# Patient Record
Sex: Male | Born: 1945 | Race: Black or African American | Hispanic: No | Marital: Married | Smoking: Former smoker
Health system: Southern US, Community
[De-identification: ages and names within clinical notes are randomized; demographics above are authoritative.]

## PROBLEM LIST (undated history)

## (undated) DIAGNOSIS — I509 Heart failure, unspecified: Secondary | ICD-10-CM

## (undated) DIAGNOSIS — Z809 Family history of malignant neoplasm, unspecified: Secondary | ICD-10-CM

## (undated) DIAGNOSIS — K219 Gastro-esophageal reflux disease without esophagitis: Secondary | ICD-10-CM

## (undated) DIAGNOSIS — H269 Unspecified cataract: Secondary | ICD-10-CM

## (undated) DIAGNOSIS — R51 Headache: Secondary | ICD-10-CM

## (undated) DIAGNOSIS — Z951 Presence of aortocoronary bypass graft: Secondary | ICD-10-CM

## (undated) DIAGNOSIS — N529 Male erectile dysfunction, unspecified: Secondary | ICD-10-CM

## (undated) DIAGNOSIS — I739 Peripheral vascular disease, unspecified: Secondary | ICD-10-CM

## (undated) DIAGNOSIS — C801 Malignant (primary) neoplasm, unspecified: Secondary | ICD-10-CM

## (undated) DIAGNOSIS — R0600 Dyspnea, unspecified: Secondary | ICD-10-CM

## (undated) DIAGNOSIS — R519 Headache, unspecified: Secondary | ICD-10-CM

## (undated) DIAGNOSIS — Z808 Family history of malignant neoplasm of other organs or systems: Secondary | ICD-10-CM

## (undated) DIAGNOSIS — E559 Vitamin D deficiency, unspecified: Secondary | ICD-10-CM

## (undated) DIAGNOSIS — E119 Type 2 diabetes mellitus without complications: Secondary | ICD-10-CM

## (undated) DIAGNOSIS — E785 Hyperlipidemia, unspecified: Secondary | ICD-10-CM

## (undated) DIAGNOSIS — M4802 Spinal stenosis, cervical region: Secondary | ICD-10-CM

## (undated) DIAGNOSIS — I2510 Atherosclerotic heart disease of native coronary artery without angina pectoris: Secondary | ICD-10-CM

## (undated) DIAGNOSIS — J309 Allergic rhinitis, unspecified: Secondary | ICD-10-CM

## (undated) DIAGNOSIS — K811 Chronic cholecystitis: Secondary | ICD-10-CM

## (undated) DIAGNOSIS — M5030 Other cervical disc degeneration, unspecified cervical region: Secondary | ICD-10-CM

## (undated) DIAGNOSIS — I10 Essential (primary) hypertension: Secondary | ICD-10-CM

## (undated) DIAGNOSIS — E860 Dehydration: Secondary | ICD-10-CM

## (undated) DIAGNOSIS — M109 Gout, unspecified: Secondary | ICD-10-CM

## (undated) DIAGNOSIS — R002 Palpitations: Secondary | ICD-10-CM

## (undated) HISTORY — DX: Hyperlipidemia, unspecified: E78.5

## (undated) HISTORY — PX: COLONOSCOPY: SHX174

## (undated) HISTORY — DX: Vitamin D deficiency, unspecified: E55.9

## (undated) HISTORY — DX: Male erectile dysfunction, unspecified: N52.9

## (undated) HISTORY — DX: Hypercalcemia: E83.52

## (undated) HISTORY — DX: Family history of malignant neoplasm, unspecified: Z80.9

## (undated) HISTORY — DX: Other cervical disc degeneration, unspecified cervical region: M50.30

## (undated) HISTORY — DX: Essential (primary) hypertension: I10

## (undated) HISTORY — DX: Spinal stenosis, cervical region: M48.02

## (undated) HISTORY — DX: Allergic rhinitis, unspecified: J30.9

## (undated) HISTORY — DX: Gout, unspecified: M10.9

## (undated) HISTORY — DX: Headache: R51

## (undated) HISTORY — DX: Gastro-esophageal reflux disease without esophagitis: K21.9

## (undated) HISTORY — DX: Palpitations: R00.2

## (undated) HISTORY — DX: Chronic cholecystitis: K81.1

## (undated) HISTORY — DX: Family history of malignant neoplasm of other organs or systems: Z80.8

## (undated) HISTORY — DX: Headache, unspecified: R51.9

---
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
## 2008-10-11 HISTORY — PX: VASCULAR SURGERY: SHX849

## 2008-11-25 ENCOUNTER — Ambulatory Visit: Payer: Self-pay | Admitting: Unknown Physician Specialty

## 2009-02-04 ENCOUNTER — Ambulatory Visit: Payer: Self-pay | Admitting: Vascular Surgery

## 2010-01-09 HISTORY — PX: OTHER SURGICAL HISTORY: SHX169

## 2010-01-21 ENCOUNTER — Ambulatory Visit: Payer: Self-pay | Admitting: Vascular Surgery

## 2010-07-06 ENCOUNTER — Ambulatory Visit: Payer: Self-pay | Admitting: Gastroenterology

## 2010-07-06 LAB — HM COLONOSCOPY

## 2010-07-13 LAB — PATHOLOGY REPORT

## 2010-12-14 ENCOUNTER — Ambulatory Visit: Payer: Self-pay

## 2011-05-03 ENCOUNTER — Ambulatory Visit: Payer: Self-pay | Admitting: Family Medicine

## 2012-02-14 ENCOUNTER — Ambulatory Visit: Payer: Self-pay | Admitting: Family Medicine

## 2012-08-01 ENCOUNTER — Ambulatory Visit: Payer: Self-pay | Admitting: Family Medicine

## 2014-01-03 ENCOUNTER — Ambulatory Visit: Payer: Self-pay | Admitting: Family Medicine

## 2014-11-15 DIAGNOSIS — R51 Headache: Secondary | ICD-10-CM | POA: Diagnosis not present

## 2015-01-09 DIAGNOSIS — H40003 Preglaucoma, unspecified, bilateral: Secondary | ICD-10-CM | POA: Diagnosis not present

## 2015-02-28 DIAGNOSIS — I10 Essential (primary) hypertension: Secondary | ICD-10-CM | POA: Diagnosis not present

## 2015-02-28 DIAGNOSIS — E785 Hyperlipidemia, unspecified: Secondary | ICD-10-CM | POA: Diagnosis not present

## 2015-02-28 DIAGNOSIS — Z0000 Encounter for general adult medical examination without abnormal findings: Secondary | ICD-10-CM | POA: Diagnosis not present

## 2015-05-02 ENCOUNTER — Ambulatory Visit (INDEPENDENT_AMBULATORY_CARE_PROVIDER_SITE_OTHER): Payer: Medicare Other | Admitting: Family Medicine

## 2015-05-02 ENCOUNTER — Other Ambulatory Visit: Payer: Self-pay

## 2015-05-02 ENCOUNTER — Encounter: Payer: Self-pay | Admitting: Family Medicine

## 2015-05-02 VITALS — BP 140/76 | HR 82 | Temp 97.4°F | Resp 16 | Wt 179.8 lb

## 2015-05-02 DIAGNOSIS — S60949A Unspecified superficial injury of unspecified finger, initial encounter: Secondary | ICD-10-CM

## 2015-05-02 DIAGNOSIS — L089 Local infection of the skin and subcutaneous tissue, unspecified: Secondary | ICD-10-CM | POA: Diagnosis not present

## 2015-05-02 MED ORDER — CEPHALEXIN 500 MG PO CAPS: 500.0000 mg | ORAL_CAPSULE | Freq: Two times a day (BID) | ORAL | Status: DC

## 2015-05-02 NOTE — Patient Instructions (Signed)
Soak right finger in epsom salts/warm water for 5-10 minutes twice daily

## 2015-05-02 NOTE — Progress Notes (Signed)
Subjective:     Patient ID: Craig [name], male   DOB: 1946-05-14, 69 y.o.   MRN: [id number]  HPI  Chief Complaint  Patient presents with  . Foreign Body in Skin    patient reports that on Wednesday after working he had noticed that his middle finger on right hand appeared to be swollen. Patient states that he believes that he has a piece of metal or wood stuck under his skin. Last Tdap was 11/03/2012  States he was using a hammer to work on a machine when the wood handle broke and something pierced his right third finger. He has cleansed with alcohol and applied a bandaid. States he has seen both pus and blood drain from the wound.   Review of Systems  Constitutional: Negative for fever and chills.       Objective:   Physical Exam  Constitutional: He appears well-developed and well-nourished. No distress.  Skin:  Right distal third finger with superficial laceration with tenderness,mild swelling and erythema. No drainage or f.b. Visualized or palpated.       Assessment:    1. Injury, superficial, finger with infection, initial encounter - cephALEXin (KEFLEX) 500 MG capsule; Take 1 capsule (500 mg total) by mouth 2 (two) times daily.  Dispense: 14 capsule; Refill: 0    Plan:    Discussed salt water soaks. Return 7/25 if not improving.

## 2015-06-25 ENCOUNTER — Telehealth: Payer: Self-pay

## 2015-06-25 ENCOUNTER — Encounter: Payer: Self-pay | Admitting: Family Medicine

## 2015-06-25 ENCOUNTER — Ambulatory Visit (INDEPENDENT_AMBULATORY_CARE_PROVIDER_SITE_OTHER): Payer: Medicare Other | Admitting: Family Medicine

## 2015-06-25 ENCOUNTER — Ambulatory Visit
Admission: RE | Admit: 2015-06-25 | Discharge: 2015-06-25 | Disposition: A | Discharge status: Home / Self Care | Payer: Medicare Other | Source: Ambulatory Visit | Attending: Family Medicine | Admitting: Family Medicine

## 2015-06-25 VITALS — BP 124/68 | HR 78 | Temp 98.5°F | Resp 16 | Wt 180.4 lb

## 2015-06-25 DIAGNOSIS — R202 Paresthesia of skin: Secondary | ICD-10-CM

## 2015-06-25 DIAGNOSIS — I70219 Atherosclerosis of native arteries of extremities with intermittent claudication, unspecified extremity: Secondary | ICD-10-CM | POA: Insufficient documentation

## 2015-06-25 DIAGNOSIS — K219 Gastro-esophageal reflux disease without esophagitis: Secondary | ICD-10-CM | POA: Insufficient documentation

## 2015-06-25 DIAGNOSIS — M4802 Spinal stenosis, cervical region: Secondary | ICD-10-CM

## 2015-06-25 DIAGNOSIS — I739 Peripheral vascular disease, unspecified: Secondary | ICD-10-CM

## 2015-06-25 DIAGNOSIS — H60543 Acute eczematoid otitis externa, bilateral: Secondary | ICD-10-CM

## 2015-06-25 DIAGNOSIS — I10 Essential (primary) hypertension: Secondary | ICD-10-CM | POA: Insufficient documentation

## 2015-06-25 LAB — POCT GLYCOSYLATED HEMOGLOBIN (HGB A1C): Hemoglobin A1C: 5.5

## 2015-06-25 MED ORDER — TRIAMCINOLONE ACETONIDE 0.1 % EX CREA: 1.0000 "application " | TOPICAL_CREAM | Freq: Two times a day (BID) | CUTANEOUS | Status: DC

## 2015-06-25 MED ORDER — GABAPENTIN 300 MG PO CAPS: 300.0000 mg | ORAL_CAPSULE | Freq: Three times a day (TID) | ORAL | Status: DC

## 2015-06-25 NOTE — Patient Instructions (Addendum)
If you need help with your neurosurgery appointment let us know. We will call you with your back x-ray results.

## 2015-06-25 NOTE — Telephone Encounter (Signed)
Left message for patient to call back. KW 

## 2015-06-25 NOTE — Progress Notes (Signed)
Subjective:     Patient ID: Craig [name], male   DOB: 01-29-1946, 69 y.o.   MRN: [id number]  HPI  Chief Complaint  Patient presents with  . Neck Pain    Patient comes in office today with concerns of pain in his neck radiating down his back for the past week.   . Leg Pain    Patient reports sharp pain the back of his legs intermittent for 1 week.   States he has persistent numbness and tingling in his feet. After riding on a small tractor last week he developed sharp pains in his posterior thighs radiating down his leg when he moved from sitting to standing. Reports he was jostled a lot on the tractor. Pain has improved. States his neck pain from cervical spinal stenosis is worsening and he is considering once again to go back to [facility] though prefers not to have surgery. 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   Review of Systems  Constitutional: Negative for fever and chills.  Endocrine:       Mild elevated glucose in May @ 108.       Objective:   Physical Exam  Constitutional: He appears well-developed and well-nourished. No distress.  HENT:  Bilateral external ears with mild scaling  Cardiovascular: Intact distal pulses.   Musculoskeletal: He exhibits no edema (lower extremities).  Muscle strength in lower extremities 5/5. SLR's to 90 degrees without back pain or radiation of pain.  Neurological:  Sensation to PP intact in distal lower extremities       Assessment:    1. Eczema of external ear, bilateral - triamcinolone cream (KENALOG) 0.1 %; Apply 1 application topically 2 (two) times daily. To external ear  Dispense: 30 g; Refill: 2  2. Cervical stenosis of spinal canal - gabapentin (NEURONTIN) 300 MG capsule; Take 1 capsule (300 mg total) by mouth 3 (three) times daily.  Dispense: 90 capsule; Refill: 5  3. Paresthesias: Prior LS spine x-ray in 2012 WNL - DG Lumbar Spine Complete; Future - POCT HgB A1C    Plan:    Further f/u  pending x-ray. Provided number for [facility] to self refer as has previously seen Dr. Hal Neer.

## 2015-06-25 NOTE — Telephone Encounter (Signed)
-----   Message from Carmon Ginsberg, [place] sent at 06/25/2015 10:38 AM EDT ----- Mild arthritic changes. Let's hold off on MRI for now and see if leg pain has gotten better.

## 2015-06-26 ENCOUNTER — Telehealth: Payer: Self-pay | Admitting: Family Medicine

## 2015-06-26 ENCOUNTER — Other Ambulatory Visit: Payer: Self-pay | Admitting: Family Medicine

## 2015-06-26 DIAGNOSIS — M79604 Pain in right leg: Secondary | ICD-10-CM

## 2015-06-26 DIAGNOSIS — R202 Paresthesia of skin: Secondary | ICD-10-CM

## 2015-06-26 DIAGNOSIS — M79605 Pain in left leg: Principal | ICD-10-CM

## 2015-06-26 NOTE — Telephone Encounter (Signed)
Request for MRI sent

## 2015-06-26 NOTE — Telephone Encounter (Signed)
Spoke with patient on the phone who reports pain in leg is not getting better and feels that it is worse and wants to proceed with MRI

## 2015-07-01 ENCOUNTER — Ambulatory Visit
Admission: RE | Admit: 2015-07-01 | Discharge: 2015-07-01 | Disposition: A | Discharge status: Home / Self Care | Payer: Medicare Other | Source: Ambulatory Visit | Attending: Family Medicine | Admitting: Family Medicine

## 2015-07-01 DIAGNOSIS — M79604 Pain in right leg: Secondary | ICD-10-CM | POA: Insufficient documentation

## 2015-07-01 DIAGNOSIS — R202 Paresthesia of skin: Secondary | ICD-10-CM | POA: Insufficient documentation

## 2015-07-01 DIAGNOSIS — M79605 Pain in left leg: Secondary | ICD-10-CM | POA: Insufficient documentation

## 2015-07-01 DIAGNOSIS — M4806 Spinal stenosis, lumbar region: Secondary | ICD-10-CM | POA: Insufficient documentation

## 2015-07-02 ENCOUNTER — Telehealth: Payer: Self-pay

## 2015-07-02 NOTE — Telephone Encounter (Signed)
[id number] 

## 2015-07-02 NOTE — Telephone Encounter (Signed)
-----   Message from Carmon Ginsberg, [place] sent at 07/02/2015  7:46 AM EDT ----- Disc bulges and arthritic changes are pushing into your spinal cord in your lower back. Recommend you get an opinion from the neurosurgeon about what to do next. Do you wish [place] to refer you?

## 2015-07-07 NOTE — Telephone Encounter (Signed)
[id number] 

## 2015-07-08 ENCOUNTER — Other Ambulatory Visit: Payer: Self-pay | Admitting: Family Medicine

## 2015-07-08 DIAGNOSIS — M79604 Pain in right leg: Secondary | ICD-10-CM

## 2015-07-08 DIAGNOSIS — M79605 Pain in left leg: Secondary | ICD-10-CM

## 2015-07-08 NOTE — Telephone Encounter (Signed)
Spoke with patient on the phone and advised as below in reference to report. He states that he would like a referral to see a neurosurgeon for further evaluation, he is requesting that it be in the [place] area

## 2015-07-08 NOTE — Telephone Encounter (Signed)
Referral in progress. 

## 2015-08-11 ENCOUNTER — Ambulatory Visit (INDEPENDENT_AMBULATORY_CARE_PROVIDER_SITE_OTHER): Payer: Medicare Other | Admitting: Family Medicine

## 2015-08-11 ENCOUNTER — Encounter: Payer: Self-pay | Admitting: Family Medicine

## 2015-08-11 VITALS — BP 150/80 | HR 77 | Temp 98.0°F | Resp 16 | Wt 180.6 lb

## 2015-08-11 DIAGNOSIS — J029 Acute pharyngitis, unspecified: Secondary | ICD-10-CM

## 2015-08-11 LAB — POCT RAPID STREP A (OFFICE): Rapid Strep A Screen: NEGATIVE

## 2015-08-11 NOTE — Patient Instructions (Addendum)
Discussed use of Delsym for cough, saline nasal spray for congestion, and Claritin for nasal drip during the day, Benadryl for drainage at night.

## 2015-08-11 NOTE — Progress Notes (Signed)
Subjective:     Patient ID: Craig [name], male   DOB: 04-29-1946, 69 y.o.   MRN: [id number]  HPI  Chief Complaint  Patient presents with  . Sore Throat  States he developed sore throat, sinus congestion, and sweats on 10/28. Reports throat remains dry with minimal cough. Completing a course of prednisone for a lumbar radiculopathy.   Review of Systems     Objective:   Physical Exam  Constitutional: He appears well-developed and well-nourished. No distress.  Ears: T.M's intact without inflammation Throat: mild erythema without tonsillar enlargement or exudate. Neck: right anterior cervical tenderness Lungs: clear    Assessment:    1. Pharyngitis: suspect early URI - POCT rapid strep A    Plan:    Discussed sx treatment of probable URI

## 2015-08-13 DIAGNOSIS — Z6826 Body mass index (BMI) 26.0-26.9, adult: Secondary | ICD-10-CM | POA: Diagnosis not present

## 2015-08-13 DIAGNOSIS — I10 Essential (primary) hypertension: Secondary | ICD-10-CM | POA: Diagnosis not present

## 2015-08-13 DIAGNOSIS — M4316 Spondylolisthesis, lumbar region: Secondary | ICD-10-CM | POA: Diagnosis not present

## 2015-08-14 ENCOUNTER — Encounter: Payer: Self-pay | Admitting: Family Medicine

## 2015-08-14 ENCOUNTER — Ambulatory Visit (INDEPENDENT_AMBULATORY_CARE_PROVIDER_SITE_OTHER): Payer: Medicare Other | Admitting: Family Medicine

## 2015-08-14 VITALS — BP 126/82 | HR 83 | Temp 98.1°F | Resp 16 | Wt 180.8 lb

## 2015-08-14 DIAGNOSIS — I10 Essential (primary) hypertension: Secondary | ICD-10-CM | POA: Diagnosis not present

## 2015-08-14 DIAGNOSIS — R52 Pain, unspecified: Secondary | ICD-10-CM | POA: Diagnosis not present

## 2015-08-14 NOTE — Patient Instructions (Signed)
We will call you with the lab results. 

## 2015-08-14 NOTE — Progress Notes (Signed)
Subjective:     Patient ID: Craig [name], male   DOB: May 29, 1946, 69 y.o.   MRN: [id number]  HPI  Chief Complaint  Patient presents with  . Generalized Body Aches    Patient comes in today with concerns of body ache that began yesterday around 9AM and subsided around midnight patient states that this has been intermittent for x 1 week.   . Hypertension    Patient has concerns of elevated blood pressure that has been fluctuating for the past several days. Earlier today patient came in office for blood pressure check and reading was 164/72.   States sinus congestion and sore throat have resolved from 10/31 visit. Reports he had severe body aches for much of the day yesterday but these have abated. Reports he saw his neurosurgeon yesterday and was placed back on Naproxen for his cervical and lumbar spinal stenosis.   Review of Systems     Objective:   Physical Exam  Constitutional: He appears well-developed and well-nourished. No distress.  Cardiovascular: Normal rate and regular rhythm.   Pulmonary/Chest: Breath sounds normal.  Abdominal: Soft. There is tenderness (mild epigastric). There is no guarding.       Assessment:    1. Body aches: transient-? Statin myopathy ? Viral prodrome - CBC with Differential/Platelet - Comprehensive metabolic panel - CK (Creatine Kinase)  2. Essential hypertension - Comprehensive metabolic panel    Plan:    Further f/u pending labs.

## 2015-08-15 ENCOUNTER — Telehealth: Payer: Self-pay

## 2015-08-15 LAB — COMPREHENSIVE METABOLIC PANEL
ALT: 43 IU/L (ref 0–44)
AST: 30 IU/L (ref 0–40)
Albumin/Globulin Ratio: 1.7 (ref 1.1–2.5)
Albumin: 4.3 g/dL (ref 3.6–4.8)
Alkaline Phosphatase: 48 IU/L (ref 39–117)
BUN/Creatinine Ratio: 15 (ref 10–22)
BUN: 19 mg/dL (ref 8–27)
Bilirubin Total: 0.4 mg/dL (ref 0.0–1.2)
CO2: 23 mmol/L (ref 18–29)
Calcium: 9.6 mg/dL (ref 8.6–10.2)
Chloride: 96 mmol/L — ABNORMAL LOW (ref 97–106)
Creatinine, Ser: 1.23 mg/dL (ref 0.76–1.27)
GFR calc Af Amer: 69 mL/min/{1.73_m2} (ref >59)
GFR calc non Af Amer: 60 mL/min/{1.73_m2} (ref >59)
Globulin, Total: 2.5 g/dL (ref 1.5–4.5)
Glucose: 104 mg/dL — ABNORMAL HIGH (ref 65–99)
Potassium: 4.7 mmol/L (ref 3.5–5.2)
Sodium: 137 mmol/L (ref 136–144)
Total Protein: 6.8 g/dL (ref 6.0–8.5)

## 2015-08-15 LAB — CBC WITH DIFFERENTIAL/PLATELET
Basophils Absolute: 0 10*3/uL (ref 0.0–0.2)
Basos: 1 %
EOS (ABSOLUTE): 0.1 10*3/uL (ref 0.0–0.4)
Eos: 2 %
Hematocrit: 39.7 % (ref 37.5–51.0)
Hemoglobin: 13.1 g/dL (ref 12.6–17.7)
Immature Grans (Abs): 0.1 10*3/uL (ref 0.0–0.1)
Immature Granulocytes: 1 %
Lymphocytes Absolute: 1 10*3/uL (ref 0.7–3.1)
Lymphs: 12 %
MCH: 29.3 pg (ref 26.6–33.0)
MCHC: 33 g/dL (ref 31.5–35.7)
MCV: 89 fL (ref 79–97)
Monocytes Absolute: 0.6 10*3/uL (ref 0.1–0.9)
Monocytes: 8 %
Neutrophils Absolute: 6.2 10*3/uL (ref 1.4–7.0)
Neutrophils: 76 %
Platelets: 238 10*3/uL (ref 150–379)
RBC: 4.47 x10E6/uL (ref 4.14–5.80)
RDW: 15.4 % (ref 12.3–15.4)
WBC: 8 10*3/uL (ref 3.4–10.8)

## 2015-08-15 LAB — CK: Total CK: 108 U/L (ref 24–204)

## 2015-08-15 NOTE — Telephone Encounter (Signed)
-----   Message from Carmon Ginsberg, [place] sent at 08/15/2015  7:44 AM EDT ----- Labs look good. No evidence that your aches were caused by your cholesterol medication.

## 2015-08-15 NOTE — Telephone Encounter (Signed)
[id number] 

## 2015-08-18 NOTE — Telephone Encounter (Signed)
Advised patient of lab report. KW

## 2015-09-01 ENCOUNTER — Other Ambulatory Visit: Payer: Self-pay | Admitting: Family Medicine

## 2015-09-10 ENCOUNTER — Encounter: Payer: Self-pay | Admitting: Family Medicine

## 2015-09-10 ENCOUNTER — Ambulatory Visit (INDEPENDENT_AMBULATORY_CARE_PROVIDER_SITE_OTHER): Payer: Medicare Other | Admitting: Family Medicine

## 2015-09-10 VITALS — BP 140/72 | HR 82 | Temp 97.8°F | Resp 16 | Wt 185.4 lb

## 2015-09-10 DIAGNOSIS — M4806 Spinal stenosis, lumbar region: Secondary | ICD-10-CM | POA: Diagnosis not present

## 2015-09-10 DIAGNOSIS — J309 Allergic rhinitis, unspecified: Secondary | ICD-10-CM | POA: Insufficient documentation

## 2015-09-10 DIAGNOSIS — Z23 Encounter for immunization: Secondary | ICD-10-CM | POA: Diagnosis not present

## 2015-09-10 DIAGNOSIS — K449 Diaphragmatic hernia without obstruction or gangrene: Secondary | ICD-10-CM | POA: Insufficient documentation

## 2015-09-10 DIAGNOSIS — D229 Melanocytic nevi, unspecified: Secondary | ICD-10-CM | POA: Insufficient documentation

## 2015-09-10 DIAGNOSIS — E782 Mixed hyperlipidemia: Secondary | ICD-10-CM | POA: Insufficient documentation

## 2015-09-10 DIAGNOSIS — M549 Dorsalgia, unspecified: Secondary | ICD-10-CM | POA: Insufficient documentation

## 2015-09-10 DIAGNOSIS — R002 Palpitations: Secondary | ICD-10-CM | POA: Insufficient documentation

## 2015-09-10 DIAGNOSIS — R51 Headache: Secondary | ICD-10-CM

## 2015-09-10 DIAGNOSIS — N529 Male erectile dysfunction, unspecified: Secondary | ICD-10-CM | POA: Insufficient documentation

## 2015-09-10 DIAGNOSIS — I10 Essential (primary) hypertension: Secondary | ICD-10-CM | POA: Insufficient documentation

## 2015-09-10 DIAGNOSIS — M48061 Spinal stenosis, lumbar region without neurogenic claudication: Secondary | ICD-10-CM

## 2015-09-10 DIAGNOSIS — Z86010 Personal history of colonic polyps: Secondary | ICD-10-CM | POA: Insufficient documentation

## 2015-09-10 DIAGNOSIS — M5030 Other cervical disc degeneration, unspecified cervical region: Secondary | ICD-10-CM | POA: Insufficient documentation

## 2015-09-10 DIAGNOSIS — E785 Hyperlipidemia, unspecified: Secondary | ICD-10-CM | POA: Insufficient documentation

## 2015-09-10 DIAGNOSIS — R519 Headache, unspecified: Secondary | ICD-10-CM | POA: Insufficient documentation

## 2015-09-10 MED ORDER — PREDNISONE 20 MG PO TABS: ORAL_TABLET | ORAL | Status: DC

## 2015-09-10 NOTE — Patient Instructions (Signed)
Stop Naproxen while on prednisone. Do f/u with your neurosurgeon.

## 2015-09-10 NOTE — Progress Notes (Signed)
Subjective:     Patient ID: Craig [name], male   DOB: 04-02-1946, 69 y.o.   MRN: [id number]  HPI  Chief Complaint  Patient presents with  . Back Pain    Patient comes in office today with complaints of lower back pain on the right side shooting down to his leg. Patient states that he has had numbness in both of his feet but he states it is unrelated.   Reports his appointment with his neurosurgeon last week for lumbar spinal stenosis was cancelled. He is trying to reschedule. Remains on Naproxen. 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 Reports he is a candidate for spinal injections and he hopes it will help him to work more.   Review of Systems     Objective:   Physical Exam  Constitutional: He appears well-developed and well-nourished. No distress.  Abdominal: Soft. There is no tenderness.  Musculoskeletal:  Muscle strength in lower extremities 5/5. SLR's to 90 degrees without radiation of back pain.       Assessment:    1. Need for influenza vaccination - Flu vaccine HIGH DOSE PF  2. Spinal stenosis, lumbar - predniSONE (DELTASONE) 20 MG tablet; Taper as follows: 3 pills for 4 days, two pills for 4 days, one pill for four days  Dispense: 24 tablet; Refill: 0    Plan:    F/u with neurosurgery.

## 2015-10-09 ENCOUNTER — Ambulatory Visit (INDEPENDENT_AMBULATORY_CARE_PROVIDER_SITE_OTHER): Payer: Medicare Other | Admitting: Family Medicine

## 2015-10-09 VITALS — BP 152/70 | HR 92 | Temp 98.2°F | Resp 16 | Wt 186.0 lb

## 2015-10-09 DIAGNOSIS — M4806 Spinal stenosis, lumbar region: Secondary | ICD-10-CM | POA: Diagnosis not present

## 2015-10-09 DIAGNOSIS — M5430 Sciatica, unspecified side: Secondary | ICD-10-CM | POA: Diagnosis not present

## 2015-10-09 DIAGNOSIS — M48061 Spinal stenosis, lumbar region without neurogenic claudication: Secondary | ICD-10-CM

## 2015-10-09 MED ORDER — PREDNISONE 20 MG PO TABS: ORAL_TABLET | ORAL | Status: DC

## 2015-10-09 NOTE — Progress Notes (Signed)
Subjective:     Patient ID: [name], male   DOB: [birth year], 69 y.o.   MRN: [id number]  HPI  Chief Complaint  Patient presents with  . 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 He was considering spinal injections but had trouble making the appointment and did not follow through.   Review of Systems     Objective:   Physical Exam  Constitutional: He appears well-developed and well-nourished. No distress.  Musculoskeletal:  Muscle strength in lower extremities 5/5. SLR's to 90 degrees without radiation of back pain. Mild tenderness of his right ischial area.       Assessment:    1. Sciatica associated with disorder of lumbar spine, unspecified laterality - predniSONE (DELTASONE) 20 MG tablet; Taper as follows: 3 pills for 4 days, two pills for 4 days, one pill for four days  Dispense: 24 tablet; Refill: 0  2. Spinal stenosis, lumba - Ambulatory referral to Neurrosurgery    Plan:   Hold Naproxen while on prednisone.

## 2015-10-09 NOTE — Patient Instructions (Signed)
Stop Naproxen while on prednisone

## 2015-10-20 ENCOUNTER — Other Ambulatory Visit: Payer: Self-pay | Admitting: Family Medicine

## 2015-11-05 DIAGNOSIS — M4316 Spondylolisthesis, lumbar region: Secondary | ICD-10-CM | POA: Diagnosis not present

## 2015-11-24 DIAGNOSIS — I10 Essential (primary) hypertension: Secondary | ICD-10-CM | POA: Diagnosis not present

## 2015-11-24 DIAGNOSIS — M4316 Spondylolisthesis, lumbar region: Secondary | ICD-10-CM | POA: Diagnosis not present

## 2015-11-24 DIAGNOSIS — M4806 Spinal stenosis, lumbar region: Secondary | ICD-10-CM | POA: Diagnosis not present

## 2015-11-24 DIAGNOSIS — K210 Gastro-esophageal reflux disease with esophagitis: Secondary | ICD-10-CM | POA: Diagnosis not present

## 2015-11-25 DIAGNOSIS — I10 Essential (primary) hypertension: Secondary | ICD-10-CM | POA: Diagnosis not present

## 2015-11-25 DIAGNOSIS — M4806 Spinal stenosis, lumbar region: Secondary | ICD-10-CM | POA: Diagnosis not present

## 2015-11-26 ENCOUNTER — Other Ambulatory Visit: Payer: Self-pay | Admitting: Family Medicine

## 2015-11-30 ENCOUNTER — Other Ambulatory Visit: Payer: Self-pay | Admitting: Family Medicine

## 2016-02-08 ENCOUNTER — Other Ambulatory Visit: Payer: Self-pay | Admitting: Family Medicine

## 2016-03-17 DIAGNOSIS — I10 Essential (primary) hypertension: Secondary | ICD-10-CM | POA: Diagnosis not present

## 2016-03-17 DIAGNOSIS — M4806 Spinal stenosis, lumbar region: Secondary | ICD-10-CM | POA: Diagnosis not present

## 2016-03-31 ENCOUNTER — Other Ambulatory Visit: Payer: Self-pay | Admitting: Family Medicine

## 2016-03-31 ENCOUNTER — Ambulatory Visit (INDEPENDENT_AMBULATORY_CARE_PROVIDER_SITE_OTHER): Payer: Medicare Other | Admitting: Family Medicine

## 2016-03-31 ENCOUNTER — Encounter: Payer: Self-pay | Admitting: Family Medicine

## 2016-03-31 VITALS — BP 158/84 | HR 76 | Temp 98.2°F | Resp 16 | Ht 69.0 in | Wt 179.0 lb

## 2016-03-31 DIAGNOSIS — M48061 Spinal stenosis, lumbar region without neurogenic claudication: Secondary | ICD-10-CM

## 2016-03-31 DIAGNOSIS — J301 Allergic rhinitis due to pollen: Secondary | ICD-10-CM

## 2016-03-31 DIAGNOSIS — Z1159 Encounter for screening for other viral diseases: Secondary | ICD-10-CM

## 2016-03-31 DIAGNOSIS — K219 Gastro-esophageal reflux disease without esophagitis: Secondary | ICD-10-CM | POA: Diagnosis not present

## 2016-03-31 DIAGNOSIS — I10 Essential (primary) hypertension: Secondary | ICD-10-CM | POA: Diagnosis not present

## 2016-03-31 DIAGNOSIS — M4802 Spinal stenosis, cervical region: Secondary | ICD-10-CM | POA: Diagnosis not present

## 2016-03-31 DIAGNOSIS — Z0000 Encounter for general adult medical examination without abnormal findings: Secondary | ICD-10-CM

## 2016-03-31 DIAGNOSIS — M4806 Spinal stenosis, lumbar region: Secondary | ICD-10-CM

## 2016-03-31 DIAGNOSIS — I739 Peripheral vascular disease, unspecified: Secondary | ICD-10-CM

## 2016-03-31 DIAGNOSIS — E785 Hyperlipidemia, unspecified: Secondary | ICD-10-CM | POA: Diagnosis not present

## 2016-03-31 NOTE — Patient Instructions (Signed)
We will call you with the lab results. 

## 2016-03-31 NOTE — Progress Notes (Signed)
Subjective:     Patient ID: Craig [name], male   DOB: 1946/08/04, 70 y.o.   MRN: [id number]  HPI Here for annual physical. Has been receiving back injections per Dr. Brien Few at [facility] with relief for up to 3 months. Last injection two weeks ago. Has also seen Dr. Hal Neer, neurosurgery, in the past but is trying to defer surgery as long as he can.  Review of Systems General: chronic low back back and neck pain due to stenosis. Immunizations up to date. HEENT: no regular dental visits but is pending eye exam (glasses). Cardiovascular: no chest pain, shortness of breath, or palpitations. Denies PVD claudication Respiratory: continue to smoke 1 ppd. No current desire to quit. GI: no heartburn (controlled with medication) no change in bowel habits or blood in the stool. Normal colonoscopy in 2011. Prefers to drink beer on the weekends up to a case +. GU: nocturia x 0, no change in bladder habits  Neuro: no change in memory-defers cognitive screen Psychiatric: not depressed Musculoskeletal: chronic pain as above    Objective:   Physical Exam  Constitutional: He appears well-developed and well-nourished. No distress.  Eyes:pupils equal with decreased reactivity to light, EOMI Neck: no thyromegaly, tenderness or nodules, no cervical adenopathy or carotid bruits ENT: TM's intact without inflammation; No tonsillar enlargement or exudate, Lungs: Clear Heart : RRR without murmur or gallop Abd: bowel sounds present, soft, non-tender, no organomegaly Rectal: Prostate firm and non-tender Extremities: no edema, pedal pulses intact @ 1+ Skin: no atypical lesions noted on his back.  Two mobile cysts in his inner buttock area near his rectum     Assessment:    1. Medicare annual wellness visit, subsequent - PSA  2. Essential (primary) hypertension - Comprehensive metabolic panel  3. Peripheral vascular disease ([place]): stable  4. Gastroesophageal reflux disease without esophagitis:  controlled with medication  5. Cervical stenosis of spinal canal: per neurosurgery  6. Spinal stenosis, lumbar: per neurosurgery  7. HLD (hyperlipidemia) - Lipid panel  8. Allergic rhinitis due to pollen: steroid nasal spray as needed  9. Need for hepatitis C screening test - Hepatitis C Antibody    Plan:    Further f/u pending labs. Does not wish modify smoking or alcohol intake at this time.

## 2016-04-01 LAB — PSA: Prostate Specific Ag, Serum: 0.7 ng/mL (ref 0.0–4.0)

## 2016-04-01 LAB — COMPREHENSIVE METABOLIC PANEL
ALT: 10 IU/L (ref 0–44)
AST: 11 IU/L (ref 0–40)
Albumin/Globulin Ratio: 1.4 (ref 1.2–2.2)
Albumin: 4.6 g/dL (ref 3.6–4.8)
Alkaline Phosphatase: 44 IU/L (ref 39–117)
BUN/Creatinine Ratio: 15 (ref 10–24)
BUN: 15 mg/dL (ref 8–27)
CO2: 24 mmol/L (ref 18–29)
Calcium: 10 mg/dL (ref 8.6–10.2)
Chloride: 99 mmol/L (ref 96–106)
Creatinine, Ser: 0.99 mg/dL (ref 0.76–1.27)
GFR calc Af Amer: 89 mL/min/{1.73_m2} (ref >59)
GFR calc non Af Amer: 77 mL/min/{1.73_m2} (ref >59)
Globulin, Total: 3.2 g/dL (ref 1.5–4.5)
Glucose: 98 mg/dL (ref 65–99)
Potassium: 4.5 mmol/L (ref 3.5–5.2)
Sodium: 141 mmol/L (ref 134–144)
Total Protein: 7.8 g/dL (ref 6.0–8.5)

## 2016-04-01 LAB — SPECIMEN STATUS REPORT

## 2016-04-01 LAB — LIPID PANEL
Chol/HDL Ratio: 3 ratio units (ref 0.0–5.0)
Cholesterol, Total: 195 mg/dL (ref 100–199)
HDL: 64 mg/dL (ref >39)
LDL Calculated: 109 mg/dL — ABNORMAL HIGH (ref 0–99)
Triglycerides: 111 mg/dL (ref 0–149)
VLDL Cholesterol Cal: 22 mg/dL (ref 5–40)

## 2016-04-01 LAB — HEPATITIS C ANTIBODY: Hep C Virus Ab: <0.1 s/co ratio (ref 0.0–0.9)

## 2016-04-07 DIAGNOSIS — H40003 Preglaucoma, unspecified, bilateral: Secondary | ICD-10-CM | POA: Diagnosis not present

## 2016-04-13 ENCOUNTER — Other Ambulatory Visit: Payer: Self-pay | Admitting: Family Medicine

## 2016-05-17 NOTE — Telephone Encounter (Signed)
error 

## 2016-05-26 ENCOUNTER — Other Ambulatory Visit: Payer: Self-pay | Admitting: Family Medicine

## 2016-05-26 NOTE — Telephone Encounter (Signed)
LOV 03/31/2016. Renaldo Fiddler, CMA

## 2016-06-02 DIAGNOSIS — M4806 Spinal stenosis, lumbar region: Secondary | ICD-10-CM | POA: Diagnosis not present

## 2016-06-21 ENCOUNTER — Other Ambulatory Visit: Payer: Self-pay | Admitting: Family Medicine

## 2016-06-21 ENCOUNTER — Telehealth: Payer: Self-pay | Admitting: Family Medicine

## 2016-06-21 DIAGNOSIS — J301 Allergic rhinitis due to pollen: Secondary | ICD-10-CM

## 2016-06-21 MED ORDER — FLUTICASONE PROPIONATE 50 MCG/ACT NA SUSP: 2.0000 | Freq: Every day | NASAL | 6 refills | Status: DC

## 2016-06-21 NOTE — Telephone Encounter (Signed)
Patient wants refills on Flonase Nasal Spray called to [facility].

## 2016-06-21 NOTE — Telephone Encounter (Signed)
Flonase sent in

## 2016-08-04 DIAGNOSIS — M48062 Spinal stenosis, lumbar region with neurogenic claudication: Secondary | ICD-10-CM | POA: Diagnosis not present

## 2016-08-04 DIAGNOSIS — M5416 Radiculopathy, lumbar region: Secondary | ICD-10-CM | POA: Diagnosis not present

## 2016-08-04 DIAGNOSIS — I10 Essential (primary) hypertension: Secondary | ICD-10-CM | POA: Diagnosis not present

## 2016-08-22 ENCOUNTER — Other Ambulatory Visit: Payer: Self-pay | Admitting: Family Medicine

## 2016-08-27 ENCOUNTER — Ambulatory Visit (INDEPENDENT_AMBULATORY_CARE_PROVIDER_SITE_OTHER): Payer: Medicare Other

## 2016-08-27 DIAGNOSIS — Z23 Encounter for immunization: Secondary | ICD-10-CM | POA: Diagnosis not present

## 2016-08-29 ENCOUNTER — Other Ambulatory Visit: Payer: Self-pay | Admitting: Family Medicine

## 2016-08-29 DIAGNOSIS — H60543 Acute eczematoid otitis externa, bilateral: Secondary | ICD-10-CM

## 2016-09-28 DIAGNOSIS — M5416 Radiculopathy, lumbar region: Secondary | ICD-10-CM | POA: Diagnosis not present

## 2016-10-07 ENCOUNTER — Other Ambulatory Visit: Payer: Self-pay | Admitting: Family Medicine

## 2016-10-11 HISTORY — PX: BACK SURGERY: SHX140

## 2016-10-12 ENCOUNTER — Other Ambulatory Visit: Payer: Self-pay

## 2016-10-12 DIAGNOSIS — I10 Essential (primary) hypertension: Secondary | ICD-10-CM | POA: Diagnosis not present

## 2016-10-12 DIAGNOSIS — M5416 Radiculopathy, lumbar region: Secondary | ICD-10-CM | POA: Diagnosis not present

## 2016-10-12 DIAGNOSIS — M48062 Spinal stenosis, lumbar region with neurogenic claudication: Secondary | ICD-10-CM | POA: Diagnosis not present

## 2016-10-12 MED ORDER — PRAVASTATIN SODIUM 40 MG PO TABS: 40.0000 mg | ORAL_TABLET | Freq: Every day | ORAL | 3 refills | Status: DC

## 2016-10-12 MED ORDER — LOSARTAN POTASSIUM 100 MG PO TABS: 100.0000 mg | ORAL_TABLET | Freq: Every day | ORAL | 3 refills | Status: DC

## 2016-10-12 MED ORDER — GABAPENTIN 300 MG PO CAPS: 300.0000 mg | ORAL_CAPSULE | Freq: Three times a day (TID) | ORAL | 5 refills | Status: DC

## 2016-10-12 MED ORDER — OMEPRAZOLE 40 MG PO CPDR: 40.0000 mg | DELAYED_RELEASE_CAPSULE | Freq: Every day | ORAL | 3 refills | Status: DC

## 2016-10-12 MED ORDER — HYDROCHLOROTHIAZIDE 25 MG PO TABS: 25.0000 mg | ORAL_TABLET | Freq: Every day | ORAL | 3 refills | Status: DC

## 2016-10-12 MED ORDER — CLONIDINE HCL 0.1 MG PO TABS: 0.1000 mg | ORAL_TABLET | Freq: Two times a day (BID) | ORAL | 3 refills | Status: DC

## 2016-10-12 NOTE — Telephone Encounter (Signed)
Patient needs refills on medication. Patient reports that he wants to change back to local pharmacy. He DOES NOT want mail order. He is currently out of gabapentin. Thanks!

## 2016-10-18 ENCOUNTER — Telehealth: Payer: Self-pay | Admitting: Family Medicine

## 2016-10-18 ENCOUNTER — Other Ambulatory Visit: Payer: Self-pay | Admitting: Family Medicine

## 2016-10-18 MED ORDER — DILTIAZEM HCL ER COATED BEADS 300 MG PO CP24: 300.0000 mg | ORAL_CAPSULE | Freq: Every day | ORAL | 3 refills | Status: DC

## 2016-10-18 NOTE — Telephone Encounter (Signed)
Patient needs a refill on diltiazem (CARDIZEM CD) 300 MG 24 hr capsule.  He is completely out.  He uses [facility].

## 2016-10-18 NOTE — Telephone Encounter (Signed)
Please review. KW 

## 2016-11-02 DIAGNOSIS — M48062 Spinal stenosis, lumbar region with neurogenic claudication: Secondary | ICD-10-CM | POA: Diagnosis not present

## 2016-11-02 DIAGNOSIS — I10 Essential (primary) hypertension: Secondary | ICD-10-CM | POA: Diagnosis not present

## 2016-11-02 DIAGNOSIS — M5416 Radiculopathy, lumbar region: Secondary | ICD-10-CM | POA: Diagnosis not present

## 2016-11-10 ENCOUNTER — Ambulatory Visit (INDEPENDENT_AMBULATORY_CARE_PROVIDER_SITE_OTHER): Payer: Medicare Other | Admitting: Family Medicine

## 2016-11-10 ENCOUNTER — Encounter: Payer: Self-pay | Admitting: Family Medicine

## 2016-11-10 VITALS — BP 130/60 | HR 77 | Temp 98.2°F | Resp 16 | Wt 187.4 lb

## 2016-11-10 DIAGNOSIS — M48062 Spinal stenosis, lumbar region with neurogenic claudication: Secondary | ICD-10-CM

## 2016-11-10 DIAGNOSIS — R51 Headache: Secondary | ICD-10-CM

## 2016-11-10 DIAGNOSIS — M4802 Spinal stenosis, cervical region: Secondary | ICD-10-CM | POA: Diagnosis not present

## 2016-11-10 DIAGNOSIS — R519 Headache, unspecified: Secondary | ICD-10-CM

## 2016-11-10 NOTE — Patient Instructions (Signed)
Increase gabapentin to two pills in the morning, one at midday, and one in the evening. Try not to mix with alcohol

## 2016-11-10 NOTE — Progress Notes (Signed)
Subjective:     Patient ID: Craig [name], male   DOB: 10/28/1945, 71 y.o.   MRN: [id number]  HPI  Chief Complaint  Patient presents with  . Headache    Patient comes in office today to address headaches that has been intermittent since 09/2016. Patient describes pain as sharp and on the left side of head radiating to forehead. Associated with headaches patient reports palpitations and dizziness, he has been taking otc Tylenol.   States headaches usually last just a second to two but has had one twenty minute episode. Occur daily and can occur on either side of his head. Heart rate goes up in response to the pain. Continues to get back injections for lumbar spinal stenosis. Also has significant cervical stenosis but does not feel it is limiting him like his back. Reports current dose of gabapentin is not working. Takes two Advil twice daily with mild improvement. States his mother-in-law has been chronically ill and just died in the last two weeks. Reports his wife has been distraught.   Review of Systems     Objective:   Physical Exam  Constitutional: He appears well-developed and well-nourished. No distress.  Eyes: EOM are normal.  Pupils equal with decreased reactivity to light  Neck: Carotid bruit is not present.  Cardiovascular: Normal rate and regular rhythm.   Pulmonary/Chest: Breath sounds normal.  Musculoskeletal:  Grip strength 5/5  Psychiatric: He has a normal mood and affect. His behavior is normal.       Assessment:    1. Cervical stenosis of spinal canal: adjust dose of gabapentin  2. Spinal stenosis of lumbar region with neurogenic claudication: continue spinal injections/neurosurgery f/u  3. Bilateral headaches: ? Neurogenic ? Stress mediated    Plan:    Further f/u in two weeks.

## 2016-11-24 ENCOUNTER — Ambulatory Visit: Payer: Self-pay | Admitting: Family Medicine

## 2016-11-26 ENCOUNTER — Ambulatory Visit (INDEPENDENT_AMBULATORY_CARE_PROVIDER_SITE_OTHER): Payer: Medicare Other | Admitting: Family Medicine

## 2016-11-26 ENCOUNTER — Encounter: Payer: Self-pay | Admitting: Family Medicine

## 2016-11-26 VITALS — BP 130/66 | HR 88 | Temp 98.2°F | Resp 16 | Wt 186.0 lb

## 2016-11-26 DIAGNOSIS — M48062 Spinal stenosis, lumbar region with neurogenic claudication: Secondary | ICD-10-CM

## 2016-11-26 DIAGNOSIS — R51 Headache: Secondary | ICD-10-CM

## 2016-11-26 DIAGNOSIS — Z131 Encounter for screening for diabetes mellitus: Secondary | ICD-10-CM | POA: Diagnosis not present

## 2016-11-26 DIAGNOSIS — R519 Headache, unspecified: Secondary | ICD-10-CM

## 2016-11-26 LAB — POCT GLYCOSYLATED HEMOGLOBIN (HGB A1C): Hemoglobin A1C: 5.8

## 2016-11-26 NOTE — Patient Instructions (Signed)
Increase gabapentin to two pills 3 x day.

## 2016-11-26 NOTE — Progress Notes (Signed)
Subjective:     Patient ID: [name], male   DOB: 1946/09/29, 71 y.o.   MRN: [id number]  HPI  Chief Complaint  Patient presents with  . Headache    2 week FU. Pt was seen on 11/10/2016 for back pain and headache. Pt's Gabapentin was increased at [facility]. Pt reporst good compliance with the treatment. Pt reports his H/A is resolved, but his back pain is unchanged.  States he has had 4 back ESI last year and one this year with transient improvement: "I am thinking about back surgery." Reports that his tongue is white in the AM and he will scrape it off with his toothbrush. Denies that this is painful.   Review of Systems     Objective:   Physical Exam  Constitutional: He appears well-developed and well-nourished. No distress.  HENT:  Tongue is without plaque or lesion       Assessment:    1. Spinal stenosis of lumbar region with neurogenic claudication: will increase gabapentin 600 mg. 3 x day with his current supply of medication  2. Bilateral headaches: resolved  3. Screening for diabetes mellitus - POCT glycosylated hemoglobin (Hb A1C)    Plan:    F/u with neurosurgery/pain clinic.

## 2016-11-28 ENCOUNTER — Other Ambulatory Visit: Payer: Self-pay | Admitting: Family Medicine

## 2016-11-28 DIAGNOSIS — H60543 Acute eczematoid otitis externa, bilateral: Secondary | ICD-10-CM

## 2016-12-13 ENCOUNTER — Encounter: Payer: Self-pay | Admitting: Family Medicine

## 2016-12-13 ENCOUNTER — Ambulatory Visit (INDEPENDENT_AMBULATORY_CARE_PROVIDER_SITE_OTHER): Payer: Medicare Other | Admitting: Family Medicine

## 2016-12-13 VITALS — BP 148/70 | HR 100 | Temp 98.8°F | Resp 16 | Wt 190.8 lb

## 2016-12-13 DIAGNOSIS — M48062 Spinal stenosis, lumbar region with neurogenic claudication: Secondary | ICD-10-CM | POA: Diagnosis not present

## 2016-12-13 MED ORDER — PREDNISONE 20 MG PO TABS: ORAL_TABLET | ORAL | 0 refills | Status: DC

## 2016-12-13 NOTE — Patient Instructions (Signed)
Don't take Advil or Aleve while on prednisone. We will call you with the referral to neurosurgery.

## 2016-12-13 NOTE — Progress Notes (Signed)
Subjective:     Patient ID: Craig [name], male   DOB: [birth year], 71 y.o.   MRN: [id number]  HPI  Chief Complaint  Patient presents with  . Back Pain    Patient comes in office today to address chronic back pain, patient states that he was previously seeing Dr. Assunta Curtis to recieve injections in his back but states for the past several weeks he has not received a phone call back from office. Patient states that he has been taking up to 6 Advil daily for pain relief and is not helping.   He has been compliant with increasing his gabapentin to 600 mg. 3 x day with minimal  Improvement in leg pain. States he can't walk more than 50 yards without having to sit down to relieve pain. Wishes referral to local neurosurgeon.   Review of Systems     Objective:   Physical Exam  Constitutional: He appears well-developed and well-nourished. No distress.  Musculoskeletal:  Muscle strength in lower extremities 5/5.       Assessment:    1. Spinal stenosis of lumbar region with neurogenic claudication - Ambulatory referral to Neurosurgery - predniSONE (DELTASONE) 20 MG tablet; Taper as follows: 3 pills for 4 days, two pills for 4 days, one pill for four days  Dispense: 24 tablet; Refill: 0    Plan:    Further f/u pending neurosurgery referral. Hold nsaid's while on prednisone.

## 2017-01-04 DIAGNOSIS — M48062 Spinal stenosis, lumbar region with neurogenic claudication: Secondary | ICD-10-CM | POA: Diagnosis not present

## 2017-01-04 DIAGNOSIS — I10 Essential (primary) hypertension: Secondary | ICD-10-CM | POA: Diagnosis not present

## 2017-01-06 ENCOUNTER — Other Ambulatory Visit: Payer: Self-pay | Admitting: Neurosurgery

## 2017-01-06 DIAGNOSIS — M48062 Spinal stenosis, lumbar region with neurogenic claudication: Secondary | ICD-10-CM

## 2017-01-15 ENCOUNTER — Ambulatory Visit
Admission: RE | Admit: 2017-01-15 | Discharge: 2017-01-15 | Disposition: A | Discharge status: Home / Self Care | Payer: Medicare Other | Source: Ambulatory Visit | Attending: Neurosurgery | Admitting: Neurosurgery

## 2017-01-15 DIAGNOSIS — M4316 Spondylolisthesis, lumbar region: Secondary | ICD-10-CM | POA: Insufficient documentation

## 2017-01-15 DIAGNOSIS — M48062 Spinal stenosis, lumbar region with neurogenic claudication: Secondary | ICD-10-CM | POA: Diagnosis not present

## 2017-01-15 DIAGNOSIS — M545 Low back pain: Secondary | ICD-10-CM | POA: Diagnosis not present

## 2017-01-17 ENCOUNTER — Ambulatory Visit: Payer: Medicare Other

## 2017-01-25 DIAGNOSIS — M4316 Spondylolisthesis, lumbar region: Secondary | ICD-10-CM | POA: Diagnosis not present

## 2017-01-25 DIAGNOSIS — I10 Essential (primary) hypertension: Secondary | ICD-10-CM | POA: Diagnosis not present

## 2017-01-25 DIAGNOSIS — M48062 Spinal stenosis, lumbar region with neurogenic claudication: Secondary | ICD-10-CM | POA: Diagnosis not present

## 2017-02-02 ENCOUNTER — Other Ambulatory Visit: Payer: Self-pay | Admitting: Neurosurgery

## 2017-02-16 DIAGNOSIS — H40003 Preglaucoma, unspecified, bilateral: Secondary | ICD-10-CM | POA: Diagnosis not present

## 2017-03-02 ENCOUNTER — Other Ambulatory Visit: Payer: Self-pay | Admitting: Family Medicine

## 2017-03-08 NOTE — Pre-Procedure Instructions (Signed)
Craig [name]  03/08/2017      [id number] [place] [place] - [address] [place] [address] Phone: [phone] Fax: [place], [place] [address] [place] [address] Phone: [phone] Fax: [phone]    Your procedure is scheduled on March 16, 2017.  Report to [facility] Admitting at 630 AM.  Call this number if you have problems the morning of [phone]   Remember:  Do not eat food or drink liquids after midnight.  Take these medicines the morning of surgery with A SIP OF WATER diltiazem (cardizem), fluticasone (flonase), gabapentin (neurontin), meclizine (antivert), omeprazole (prilosec)  7 days prior to surgery STOP taking any Aspirin, Aleve, Naproxen, Ibuprofen, Motrin, Advil, Goody's, BC's, all herbal medications, fish oil, and all vitamins   Do not wear jewelry, make-up or nail polish.  Do not wear lotions, powders, or perfumes, or deoderant.  Do not shave 48 hours prior to surgery.  Men may shave face and neck.  Do not bring valuables to the hospital.  [facility] is not responsible for any belongings or valuables.  Contacts, dentures or bridgework may not be worn into surgery.  Leave your suitcase in the car.  After surgery it may be brought to your room.  For patients admitted to the hospital, discharge time will be determined by your treatment team.  Patients discharged the day of surgery will not be allowed to drive home.    Special instructions:   [facility]- Preparing For Surgery  Before surgery, you can play an important role. Because skin is not sterile, your skin needs to be as free of germs as possible. You can reduce the number of germs on your skin by washing with CHG (chlorahexidine gluconate) Soap before surgery.  CHG is an antiseptic cleaner which kills germs and bonds with the skin to continue killing germs even  after washing.  Please do not use if you have an allergy to CHG or antibacterial soaps. If your skin becomes reddened/irritated stop using the CHG.  Do not shave (including legs and underarms) for at least 48 hours prior to first CHG shower. It is OK to shave your face.  Please follow these instructions carefully.   1. Shower the NIGHT BEFORE SURGERY and the MORNING OF SURGERY with CHG.   2. If you chose to wash your hair, wash your hair first as usual with your normal shampoo.  3. After you shampoo, rinse your hair and body thoroughly to remove the shampoo.  4. Use CHG as you would any other liquid soap. You can apply CHG directly to the skin and wash gently with a scrungie or a clean washcloth.   5. Apply the CHG Soap to your body ONLY FROM THE NECK DOWN.  Do not use on open wounds or open sores. Avoid contact with your eyes, ears, mouth and genitals (private parts). Wash genitals (private parts) with your normal soap.  6. Wash thoroughly, paying special attention to the area where your surgery will be performed.  7. Thoroughly rinse your body with warm water from the neck down.  8. DO NOT shower/wash with your normal soap after using and rinsing off the CHG Soap.  9. Pat yourself dry with a CLEAN TOWEL.   10. Wear CLEAN PAJAMAS   11. Place CLEAN SHEETS on your bed the night of your first shower and DO NOT  SLEEP WITH PETS.    Day of Surgery: Do not apply any deodorants/lotions. Please wear clean clothes to the hospital/surgery center.     Please read over the following fact sheets that you were given. Pain Booklet, Coughing and Deep Breathing, MRSA Information and Surgical Site Infection Prevention

## 2017-03-09 ENCOUNTER — Encounter (HOSPITAL_COMMUNITY)
Admission: RE | Admit: 2017-03-09 | Discharge: 2017-03-09 | Disposition: A | Discharge status: Home / Self Care | Payer: Medicare Other | Source: Ambulatory Visit | Attending: Neurosurgery | Admitting: Neurosurgery

## 2017-03-09 ENCOUNTER — Encounter (HOSPITAL_COMMUNITY): Payer: Self-pay

## 2017-03-09 DIAGNOSIS — Z01810 Encounter for preprocedural cardiovascular examination: Secondary | ICD-10-CM | POA: Insufficient documentation

## 2017-03-09 DIAGNOSIS — Z01818 Encounter for other preprocedural examination: Secondary | ICD-10-CM | POA: Diagnosis not present

## 2017-03-09 DIAGNOSIS — I10 Essential (primary) hypertension: Secondary | ICD-10-CM | POA: Diagnosis not present

## 2017-03-09 DIAGNOSIS — M4316 Spondylolisthesis, lumbar region: Secondary | ICD-10-CM | POA: Insufficient documentation

## 2017-03-09 LAB — CBC
HCT: 36.4 % — ABNORMAL LOW (ref 39.0–52.0)
Hemoglobin: 11.5 g/dL — ABNORMAL LOW (ref 13.0–17.0)
MCH: 27.9 pg (ref 26.0–34.0)
MCHC: 31.6 g/dL (ref 30.0–36.0)
MCV: 88.3 fL (ref 78.0–100.0)
Platelets: 293 10*3/uL (ref 150–400)
RBC: 4.12 MIL/uL — ABNORMAL LOW (ref 4.22–5.81)
RDW: 15.8 % — ABNORMAL HIGH (ref 11.5–15.5)
WBC: 6.1 10*3/uL (ref 4.0–10.5)

## 2017-03-09 LAB — TYPE AND SCREEN
ABO/RH(D): A POS
Antibody Screen: NEGATIVE

## 2017-03-09 LAB — COMPREHENSIVE METABOLIC PANEL
ALT: 15 U/L — ABNORMAL LOW (ref 17–63)
AST: 19 U/L (ref 15–41)
Albumin: 4.2 g/dL (ref 3.5–5.0)
Alkaline Phosphatase: 39 U/L (ref 38–126)
Anion gap: 9 (ref 5–15)
BUN: 14 mg/dL (ref 6–20)
CO2: 23 mmol/L (ref 22–32)
Calcium: 9.8 mg/dL (ref 8.9–10.3)
Chloride: 107 mmol/L (ref 101–111)
Creatinine, Ser: 0.96 mg/dL (ref 0.61–1.24)
GFR calc Af Amer: >60 mL/min (ref >60)
GFR calc non Af Amer: >60 mL/min (ref >60)
Glucose, Bld: 114 mg/dL — ABNORMAL HIGH (ref 65–99)
Potassium: 4.3 mmol/L (ref 3.5–5.1)
Sodium: 139 mmol/L (ref 135–145)
Total Bilirubin: 0.2 mg/dL — ABNORMAL LOW (ref 0.3–1.2)
Total Protein: 7.4 g/dL (ref 6.5–8.1)

## 2017-03-09 LAB — ABO/RH: ABO/RH(D): A POS

## 2017-03-09 LAB — SURGICAL PCR SCREEN
MRSA, PCR: NEGATIVE
Staphylococcus aureus: POSITIVE — AB

## 2017-03-09 NOTE — Progress Notes (Addendum)
PCP: Carmon Ginsberg PA @ [facility]. [facility]up[facility]cin Scrip [facility] [address] [address]

## 2017-03-16 ENCOUNTER — Encounter (HOSPITAL_COMMUNITY)
Admission: RE | Disposition: A | Discharge status: Home / Self Care | Payer: Self-pay | Source: Ambulatory Visit | Attending: Neurosurgery

## 2017-03-16 ENCOUNTER — Inpatient Hospital Stay (HOSPITAL_COMMUNITY)
Admission: RE | Admit: 2017-03-16 | Discharge: 2017-03-18 | DRG: 455 | Disposition: A | Discharge status: Home / Self Care | Payer: Medicare Other | Source: Ambulatory Visit | Attending: Neurosurgery | Admitting: Neurosurgery

## 2017-03-16 ENCOUNTER — Inpatient Hospital Stay (HOSPITAL_COMMUNITY): Payer: Medicare Other

## 2017-03-16 ENCOUNTER — Encounter (HOSPITAL_COMMUNITY): Payer: Self-pay | Admitting: Urology

## 2017-03-16 ENCOUNTER — Inpatient Hospital Stay (HOSPITAL_COMMUNITY): Payer: Medicare Other | Admitting: Certified Registered Nurse Anesthetist

## 2017-03-16 DIAGNOSIS — E785 Hyperlipidemia, unspecified: Secondary | ICD-10-CM | POA: Diagnosis not present

## 2017-03-16 DIAGNOSIS — Z7982 Long term (current) use of aspirin: Secondary | ICD-10-CM

## 2017-03-16 DIAGNOSIS — M48062 Spinal stenosis, lumbar region with neurogenic claudication: Secondary | ICD-10-CM | POA: Diagnosis present

## 2017-03-16 DIAGNOSIS — Z79899 Other long term (current) drug therapy: Secondary | ICD-10-CM

## 2017-03-16 DIAGNOSIS — M4726 Other spondylosis with radiculopathy, lumbar region: Principal | ICD-10-CM | POA: Diagnosis present

## 2017-03-16 DIAGNOSIS — M4316 Spondylolisthesis, lumbar region: Secondary | ICD-10-CM | POA: Diagnosis not present

## 2017-03-16 DIAGNOSIS — M5030 Other cervical disc degeneration, unspecified cervical region: Secondary | ICD-10-CM | POA: Diagnosis present

## 2017-03-16 DIAGNOSIS — F17210 Nicotine dependence, cigarettes, uncomplicated: Secondary | ICD-10-CM | POA: Diagnosis present

## 2017-03-16 DIAGNOSIS — Z888 Allergy status to other drugs, medicaments and biological substances status: Secondary | ICD-10-CM

## 2017-03-16 DIAGNOSIS — M4326 Fusion of spine, lumbar region: Secondary | ICD-10-CM | POA: Diagnosis not present

## 2017-03-16 DIAGNOSIS — E559 Vitamin D deficiency, unspecified: Secondary | ICD-10-CM | POA: Diagnosis present

## 2017-03-16 DIAGNOSIS — M5416 Radiculopathy, lumbar region: Secondary | ICD-10-CM | POA: Diagnosis present

## 2017-03-16 DIAGNOSIS — M4802 Spinal stenosis, cervical region: Secondary | ICD-10-CM | POA: Diagnosis not present

## 2017-03-16 DIAGNOSIS — K219 Gastro-esophageal reflux disease without esophagitis: Secondary | ICD-10-CM | POA: Diagnosis not present

## 2017-03-16 DIAGNOSIS — Z419 Encounter for procedure for purposes other than remedying health state, unspecified: Secondary | ICD-10-CM

## 2017-03-16 DIAGNOSIS — M961 Postlaminectomy syndrome, not elsewhere classified: Secondary | ICD-10-CM | POA: Diagnosis not present

## 2017-03-16 DIAGNOSIS — I10 Essential (primary) hypertension: Secondary | ICD-10-CM | POA: Diagnosis present

## 2017-03-16 DIAGNOSIS — I739 Peripheral vascular disease, unspecified: Secondary | ICD-10-CM | POA: Diagnosis not present

## 2017-03-16 SURGERY — POSTERIOR LUMBAR FUSION 1 LEVEL
Anesthesia: General | Site: Back

## 2017-03-16 MED ORDER — ONDANSETRON HCL 4 MG/2ML IJ SOLN: 4.0000 mg | Freq: Once | INTRAMUSCULAR | Status: DC | PRN

## 2017-03-16 MED ORDER — CHLORHEXIDINE GLUCONATE CLOTH 2 % EX PADS: 6.0000 | MEDICATED_PAD | Freq: Once | CUTANEOUS | Status: DC

## 2017-03-16 MED ORDER — ACETAMINOPHEN 650 MG RE SUPP: 650.0000 mg | RECTAL | Status: DC | PRN

## 2017-03-16 MED ORDER — PHENYLEPHRINE 40 MCG/ML (10ML) SYRINGE FOR IV PUSH (FOR BLOOD PRESSURE SUPPORT)
PREFILLED_SYRINGE | INTRAVENOUS | Status: AC
  Filled 2017-03-16: qty 10

## 2017-03-16 MED ORDER — PROPOFOL 10 MG/ML IV BOLUS
INTRAVENOUS | Status: AC
  Filled 2017-03-16: qty 20

## 2017-03-16 MED ORDER — OXYCODONE HCL 5 MG PO TABS
5.0000 mg | ORAL_TABLET | Freq: Once | ORAL | Status: AC | PRN
  Administered 2017-03-16: 5 mg via ORAL

## 2017-03-16 MED ORDER — VANCOMYCIN HCL 1000 MG IV SOLR
INTRAVENOUS | Status: AC
  Filled 2017-03-16: qty 1000

## 2017-03-16 MED ORDER — FENTANYL CITRATE (PF) 100 MCG/2ML IJ SOLN
25.0000 ug | INTRAMUSCULAR | Status: DC | PRN
  Administered 2017-03-16 (×2): 50 ug via INTRAVENOUS

## 2017-03-16 MED ORDER — SODIUM CHLORIDE 0.9 % IV SOLN: 250.0000 mL | INTRAVENOUS | Status: DC

## 2017-03-16 MED ORDER — HYDROCHLOROTHIAZIDE 25 MG PO TABS
25.0000 mg | ORAL_TABLET | Freq: Every day | ORAL | Status: DC
  Administered 2017-03-17 – 2017-03-18 (×2): 25 mg via ORAL
  Filled 2017-03-16 (×2): qty 1

## 2017-03-16 MED ORDER — FENTANYL CITRATE (PF) 250 MCG/5ML IJ SOLN
INTRAMUSCULAR | Status: AC
  Filled 2017-03-16: qty 5

## 2017-03-16 MED ORDER — LIDOCAINE HCL (CARDIAC) 20 MG/ML IV SOLN
INTRAVENOUS | Status: DC | PRN
  Administered 2017-03-16: 60 mg via INTRAVENOUS

## 2017-03-16 MED ORDER — CEFAZOLIN SODIUM-DEXTROSE 2-4 GM/100ML-% IV SOLN
2.0000 g | Freq: Three times a day (TID) | INTRAVENOUS | Status: AC
  Administered 2017-03-16 – 2017-03-17 (×2): 2 g via INTRAVENOUS
  Filled 2017-03-16 (×2): qty 100

## 2017-03-16 MED ORDER — VANCOMYCIN HCL 1000 MG IV SOLR
INTRAVENOUS | Status: DC | PRN
  Administered 2017-03-16: 1000 mg via TOPICAL

## 2017-03-16 MED ORDER — MIDAZOLAM HCL 5 MG/5ML IJ SOLN
INTRAMUSCULAR | Status: DC | PRN
  Administered 2017-03-16: 2 mg via INTRAVENOUS

## 2017-03-16 MED ORDER — THROMBIN 20000 UNITS EX SOLR
CUTANEOUS | Status: AC
  Filled 2017-03-16: qty 20000

## 2017-03-16 MED ORDER — EPHEDRINE SULFATE 50 MG/ML IJ SOLN
INTRAMUSCULAR | Status: DC | PRN
Start: 2017-03-16 — End: 2017-03-16
  Administered 2017-03-16: 10 mg via INTRAVENOUS

## 2017-03-16 MED ORDER — THROMBIN 5000 UNITS EX SOLR
CUTANEOUS | Status: DC | PRN
  Administered 2017-03-16: 11:00:00 via TOPICAL

## 2017-03-16 MED ORDER — OXYCODONE HCL 5 MG PO TABS
5.0000 mg | ORAL_TABLET | ORAL | Status: DC | PRN
  Administered 2017-03-16 – 2017-03-18 (×8): 10 mg via ORAL
  Filled 2017-03-16 (×8): qty 2

## 2017-03-16 MED ORDER — SUCCINYLCHOLINE CHLORIDE 200 MG/10ML IV SOSY
PREFILLED_SYRINGE | INTRAVENOUS | Status: AC
  Filled 2017-03-16: qty 10

## 2017-03-16 MED ORDER — OXYCODONE HCL 5 MG/5ML PO SOLN: 5.0000 mg | Freq: Once | ORAL | Status: AC | PRN

## 2017-03-16 MED ORDER — CEFAZOLIN SODIUM-DEXTROSE 2-4 GM/100ML-% IV SOLN
INTRAVENOUS | Status: AC
  Filled 2017-03-16: qty 100

## 2017-03-16 MED ORDER — MORPHINE SULFATE (PF) 4 MG/ML IV SOLN: 4.0000 mg | INTRAVENOUS | Status: DC | PRN

## 2017-03-16 MED ORDER — SODIUM CHLORIDE 0.9% FLUSH
3.0000 mL | Freq: Two times a day (BID) | INTRAVENOUS | Status: DC
  Administered 2017-03-16: 3 mL via INTRAVENOUS

## 2017-03-16 MED ORDER — SUGAMMADEX SODIUM 200 MG/2ML IV SOLN
INTRAVENOUS | Status: AC
  Filled 2017-03-16: qty 2

## 2017-03-16 MED ORDER — ONDANSETRON HCL 4 MG/2ML IJ SOLN
INTRAMUSCULAR | Status: AC
  Filled 2017-03-16: qty 2

## 2017-03-16 MED ORDER — BUPIVACAINE-EPINEPHRINE (PF) 0.5% -1:200000 IJ SOLN
INTRAMUSCULAR | Status: AC
  Filled 2017-03-16: qty 30

## 2017-03-16 MED ORDER — FENTANYL CITRATE (PF) 100 MCG/2ML IJ SOLN
INTRAMUSCULAR | Status: DC | PRN
  Administered 2017-03-16 (×2): 100 ug via INTRAVENOUS
  Administered 2017-03-16 (×3): 50 ug via INTRAVENOUS

## 2017-03-16 MED ORDER — OXYCODONE HCL 5 MG/5ML PO SOLN: 5.0000 mg | Freq: Once | ORAL | Status: DC | PRN

## 2017-03-16 MED ORDER — PROPOFOL 10 MG/ML IV BOLUS
INTRAVENOUS | Status: DC | PRN
  Administered 2017-03-16: 170 mg via INTRAVENOUS

## 2017-03-16 MED ORDER — CYCLOBENZAPRINE HCL 10 MG PO TABS
ORAL_TABLET | ORAL | Status: AC
  Filled 2017-03-16: qty 1

## 2017-03-16 MED ORDER — DOCUSATE SODIUM 100 MG PO CAPS
100.0000 mg | ORAL_CAPSULE | Freq: Two times a day (BID) | ORAL | Status: DC
  Administered 2017-03-16 – 2017-03-18 (×4): 100 mg via ORAL
  Filled 2017-03-16 (×5): qty 1

## 2017-03-16 MED ORDER — MENTHOL 3 MG MT LOZG: 1.0000 | LOZENGE | OROMUCOSAL | Status: DC | PRN

## 2017-03-16 MED ORDER — THROMBIN 20000 UNITS EX SOLR
CUTANEOUS | Status: DC | PRN
  Administered 2017-03-16: 11:00:00 via TOPICAL

## 2017-03-16 MED ORDER — PANTOPRAZOLE SODIUM 40 MG PO TBEC
80.0000 mg | DELAYED_RELEASE_TABLET | Freq: Every day | ORAL | Status: DC
  Administered 2017-03-17 – 2017-03-18 (×2): 80 mg via ORAL
  Filled 2017-03-16 (×2): qty 2

## 2017-03-16 MED ORDER — FLUTICASONE PROPIONATE 50 MCG/ACT NA SUSP
2.0000 | Freq: Every day | NASAL | Status: DC
  Filled 2017-03-16: qty 16

## 2017-03-16 MED ORDER — ACETAMINOPHEN 325 MG PO TABS: 650.0000 mg | ORAL_TABLET | ORAL | Status: DC | PRN

## 2017-03-16 MED ORDER — MIDAZOLAM HCL 2 MG/2ML IJ SOLN
INTRAMUSCULAR | Status: AC
  Filled 2017-03-16: qty 2

## 2017-03-16 MED ORDER — THROMBIN 5000 UNITS EX SOLR
CUTANEOUS | Status: AC
  Filled 2017-03-16: qty 5000

## 2017-03-16 MED ORDER — ONDANSETRON HCL 4 MG/2ML IJ SOLN
INTRAMUSCULAR | Status: DC | PRN
  Administered 2017-03-16: 4 mg via INTRAVENOUS

## 2017-03-16 MED ORDER — SODIUM CHLORIDE 0.9% FLUSH: 3.0000 mL | INTRAVENOUS | Status: DC | PRN

## 2017-03-16 MED ORDER — BUPIVACAINE LIPOSOME 1.3 % IJ SUSP
20.0000 mL | INTRAMUSCULAR | Status: AC
  Administered 2017-03-16: 20 mL
  Filled 2017-03-16: qty 20

## 2017-03-16 MED ORDER — PHENYLEPHRINE HCL 10 MG/ML IJ SOLN
INTRAMUSCULAR | Status: DC | PRN
  Administered 2017-03-16: 40 ug/min via INTRAVENOUS

## 2017-03-16 MED ORDER — LIDOCAINE 2% (20 MG/ML) 5 ML SYRINGE
INTRAMUSCULAR | Status: AC
  Filled 2017-03-16: qty 5

## 2017-03-16 MED ORDER — SODIUM CHLORIDE 0.9 % IR SOLN
Status: DC | PRN
  Administered 2017-03-16: 11:00:00

## 2017-03-16 MED ORDER — BUPIVACAINE-EPINEPHRINE (PF) 0.5% -1:200000 IJ SOLN
INTRAMUSCULAR | Status: DC | PRN
  Administered 2017-03-16: 10 mL

## 2017-03-16 MED ORDER — DEXAMETHASONE SODIUM PHOSPHATE 10 MG/ML IJ SOLN
INTRAMUSCULAR | Status: AC
  Filled 2017-03-16: qty 1

## 2017-03-16 MED ORDER — BACITRACIN ZINC 500 UNIT/GM EX OINT
TOPICAL_OINTMENT | CUTANEOUS | Status: DC | PRN
  Administered 2017-03-16: 1 via TOPICAL

## 2017-03-16 MED ORDER — ONDANSETRON HCL 4 MG/2ML IJ SOLN: 4.0000 mg | Freq: Four times a day (QID) | INTRAMUSCULAR | Status: DC | PRN

## 2017-03-16 MED ORDER — GABAPENTIN 300 MG PO CAPS
300.0000 mg | ORAL_CAPSULE | Freq: Three times a day (TID) | ORAL | Status: DC
  Administered 2017-03-16 – 2017-03-18 (×6): 300 mg via ORAL
  Filled 2017-03-16 (×6): qty 1

## 2017-03-16 MED ORDER — LATANOPROST 0.005 % OP SOLN
1.0000 [drp] | Freq: Every day | OPHTHALMIC | Status: DC
  Administered 2017-03-16 – 2017-03-17 (×2): 1 [drp] via OPHTHALMIC
  Filled 2017-03-16: qty 2.5

## 2017-03-16 MED ORDER — DILTIAZEM HCL ER COATED BEADS 300 MG PO CP24
300.0000 mg | ORAL_CAPSULE | Freq: Every day | ORAL | Status: DC
  Administered 2017-03-17 – 2017-03-18 (×2): 300 mg via ORAL
  Filled 2017-03-16 (×2): qty 1

## 2017-03-16 MED ORDER — PHENOL 1.4 % MT LIQD: 1.0000 | OROMUCOSAL | Status: DC | PRN

## 2017-03-16 MED ORDER — LOSARTAN POTASSIUM 50 MG PO TABS
100.0000 mg | ORAL_TABLET | Freq: Every day | ORAL | Status: DC
  Administered 2017-03-17 – 2017-03-18 (×2): 100 mg via ORAL
  Filled 2017-03-16 (×2): qty 2

## 2017-03-16 MED ORDER — LACTATED RINGERS IV SOLN
INTRAVENOUS | Status: DC | PRN
  Administered 2017-03-16 (×2): via INTRAVENOUS

## 2017-03-16 MED ORDER — OXYCODONE HCL 5 MG PO TABS: 5.0000 mg | ORAL_TABLET | Freq: Once | ORAL | Status: DC | PRN

## 2017-03-16 MED ORDER — CLONIDINE HCL 0.1 MG PO TABS
0.1000 mg | ORAL_TABLET | Freq: Two times a day (BID) | ORAL | Status: DC
  Administered 2017-03-16 – 2017-03-18 (×4): 0.1 mg via ORAL
  Filled 2017-03-16 (×4): qty 1

## 2017-03-16 MED ORDER — PHENYLEPHRINE HCL 10 MG/ML IJ SOLN
INTRAMUSCULAR | Status: DC | PRN
  Administered 2017-03-16: 80 ug via INTRAVENOUS

## 2017-03-16 MED ORDER — BISACODYL 10 MG RE SUPP: 10.0000 mg | Freq: Every day | RECTAL | Status: DC | PRN

## 2017-03-16 MED ORDER — FENTANYL CITRATE (PF) 100 MCG/2ML IJ SOLN: 25.0000 ug | INTRAMUSCULAR | Status: DC | PRN

## 2017-03-16 MED ORDER — CEFAZOLIN SODIUM-DEXTROSE 2-4 GM/100ML-% IV SOLN
2.0000 g | INTRAVENOUS | Status: AC
  Administered 2017-03-16: 2 g via INTRAVENOUS

## 2017-03-16 MED ORDER — ROCURONIUM BROMIDE 100 MG/10ML IV SOLN
INTRAVENOUS | Status: DC | PRN
  Administered 2017-03-16: 50 mg via INTRAVENOUS

## 2017-03-16 MED ORDER — DEXAMETHASONE SODIUM PHOSPHATE 4 MG/ML IJ SOLN
INTRAMUSCULAR | Status: DC | PRN
  Administered 2017-03-16: 10 mg via INTRAVENOUS

## 2017-03-16 MED ORDER — BACITRACIN ZINC 500 UNIT/GM EX OINT
TOPICAL_OINTMENT | CUTANEOUS | Status: AC
  Filled 2017-03-16: qty 28.35

## 2017-03-16 MED ORDER — CYCLOBENZAPRINE HCL 10 MG PO TABS
10.0000 mg | ORAL_TABLET | Freq: Three times a day (TID) | ORAL | Status: DC | PRN
  Administered 2017-03-16 – 2017-03-18 (×5): 10 mg via ORAL
  Filled 2017-03-16 (×4): qty 1

## 2017-03-16 MED ORDER — OXYCODONE HCL 5 MG PO TABS
ORAL_TABLET | ORAL | Status: AC
  Filled 2017-03-16: qty 1

## 2017-03-16 MED ORDER — EPHEDRINE 5 MG/ML INJ
INTRAVENOUS | Status: AC
  Filled 2017-03-16: qty 10

## 2017-03-16 MED ORDER — ROCURONIUM BROMIDE 10 MG/ML (PF) SYRINGE
PREFILLED_SYRINGE | INTRAVENOUS | Status: AC
  Filled 2017-03-16: qty 5

## 2017-03-16 MED ORDER — ONDANSETRON HCL 4 MG PO TABS: 4.0000 mg | ORAL_TABLET | Freq: Four times a day (QID) | ORAL | Status: DC | PRN

## 2017-03-16 MED ORDER — 0.9 % SODIUM CHLORIDE (POUR BTL) OPTIME
TOPICAL | Status: DC | PRN
  Administered 2017-03-16: 1000 mL

## 2017-03-16 MED ORDER — PRAVASTATIN SODIUM 40 MG PO TABS
40.0000 mg | ORAL_TABLET | Freq: Every day | ORAL | Status: DC
  Administered 2017-03-17: 40 mg via ORAL
  Filled 2017-03-16: qty 1

## 2017-03-16 MED ORDER — FENTANYL CITRATE (PF) 100 MCG/2ML IJ SOLN
INTRAMUSCULAR | Status: AC
  Filled 2017-03-16: qty 2

## 2017-03-16 MED FILL — Heparin Sodium (Porcine) Inj 1000 Unit/ML: INTRAMUSCULAR | Qty: 30 | Status: AC

## 2017-03-16 MED FILL — Sodium Chloride IV Soln 0.9%: INTRAVENOUS | Qty: 1000 | Status: AC

## 2017-03-16 SURGICAL SUPPLY — 67 items
APL SKNCLS STERI-STRIP NONHPOA (GAUZE/BANDAGES/DRESSINGS) ×1
BAG DECANTER FOR FLEXI CONT (MISCELLANEOUS) ×2 IMPLANT
BASKET BONE COLLECTION (BASKET) ×1 IMPLANT
BENZOIN TINCTURE PRP APPL 2/3 (GAUZE/BANDAGES/DRESSINGS) ×2 IMPLANT
BLADE CLIPPER SURG (BLADE) IMPLANT
BUR MATCHSTICK NEURO 3.0 LAGG (BURR) ×2 IMPLANT
BUR PRECISION FLUTE 6.0 (BURR) ×2 IMPLANT
CANISTER SUCT 3000ML PPV (MISCELLANEOUS) ×2 IMPLANT
CAP REVERE LOCKING (Cap) ×4 IMPLANT
CARTRIDGE OIL MAESTRO DRILL (MISCELLANEOUS) ×1 IMPLANT
CONT SPEC 4OZ CLIKSEAL STRL BL (MISCELLANEOUS) ×2 IMPLANT
COVER BACK TABLE 60X90IN (DRAPES) ×4 IMPLANT
DIFFUSER DRILL AIR PNEUMATIC (MISCELLANEOUS) ×2 IMPLANT
DRAPE C-ARM 42X72 X-RAY (DRAPES) ×4 IMPLANT
DRAPE HALF SHEET 40X57 (DRAPES) ×3 IMPLANT
DRAPE LAPAROTOMY 100X72X124 (DRAPES) ×2 IMPLANT
DRAPE POUCH INSTRU U-SHP 10X18 (DRAPES) ×2 IMPLANT
DRAPE SURG 17X23 STRL (DRAPES) ×8 IMPLANT
ELECT BLADE 4.0 EZ CLEAN MEGAD (MISCELLANEOUS) ×4
ELECT REM PT RETURN 9FT ADLT (ELECTROSURGICAL) ×2
ELECTRODE BLDE 4.0 EZ CLN MEGD (MISCELLANEOUS) ×1 IMPLANT
ELECTRODE REM PT RTRN 9FT ADLT (ELECTROSURGICAL) ×1 IMPLANT
EVACUATOR 1/8 PVC DRAIN (DRAIN) IMPLANT
GAUZE SPONGE 4X4 12PLY STRL (GAUZE/BANDAGES/DRESSINGS) ×2 IMPLANT
GAUZE SPONGE 4X4 16PLY XRAY LF (GAUZE/BANDAGES/DRESSINGS) ×1 IMPLANT
GLOVE BIO SURGEON STRL SZ8 (GLOVE) ×5 IMPLANT
GLOVE BIO SURGEON STRL SZ8.5 (GLOVE) ×4 IMPLANT
GLOVE BIOGEL PI IND STRL 7.5 (GLOVE) IMPLANT
GLOVE BIOGEL PI INDICATOR 7.5 (GLOVE) ×5
GLOVE EXAM NITRILE LRG STRL (GLOVE) IMPLANT
GLOVE EXAM NITRILE XL STR (GLOVE) IMPLANT
GLOVE EXAM NITRILE XS STR PU (GLOVE) IMPLANT
GOWN STRL REUS W/ TWL LRG LVL3 (GOWN DISPOSABLE) IMPLANT
GOWN STRL REUS W/ TWL XL LVL3 (GOWN DISPOSABLE) ×2 IMPLANT
GOWN STRL REUS W/TWL 2XL LVL3 (GOWN DISPOSABLE) ×1 IMPLANT
GOWN STRL REUS W/TWL LRG LVL3 (GOWN DISPOSABLE) ×2
GOWN STRL REUS W/TWL XL LVL3 (GOWN DISPOSABLE) ×6
KIT BASIN OR (CUSTOM PROCEDURE TRAY) ×2 IMPLANT
KIT ROOM TURNOVER OR (KITS) ×2 IMPLANT
NDL HYPO 21X1.5 SAFETY (NEEDLE) IMPLANT
NEEDLE HYPO 21X1.5 SAFETY (NEEDLE) ×2 IMPLANT
NEEDLE HYPO 22GX1.5 SAFETY (NEEDLE) ×2 IMPLANT
NS IRRIG 1000ML POUR BTL (IV SOLUTION) ×2 IMPLANT
OIL CARTRIDGE MAESTRO DRILL (MISCELLANEOUS) ×2
PACK LAMINECTOMY NEURO (CUSTOM PROCEDURE TRAY) ×2 IMPLANT
PAD ARMBOARD 7.5X6 YLW CONV (MISCELLANEOUS) ×6 IMPLANT
PATTIES SURGICAL .5 X1 (DISPOSABLE) IMPLANT
PATTIES SURGICAL 1X1 (DISPOSABLE) ×1 IMPLANT
PENCIL BUTTON HOLSTER BLD 10FT (ELECTRODE) ×1 IMPLANT
ROD REVERE 6.35 45MM (Rod) ×2 IMPLANT
SCREW 7.5X45MM (Screw) ×2 IMPLANT
SCREW 7.5X50MM (Screw) ×2 IMPLANT
SPACER ALTERA 10X31-8 (Spacer) ×1 IMPLANT
SPONGE LAP 4X18 X RAY DECT (DISPOSABLE) IMPLANT
SPONGE NEURO XRAY DETECT 1X3 (DISPOSABLE) IMPLANT
SPONGE SURGIFOAM ABS GEL 100 (HEMOSTASIS) ×2 IMPLANT
STRIP BIOACTIVE 20CC 25X100X8 (Miscellaneous) ×1 IMPLANT
STRIP CLOSURE SKIN 1/2X4 (GAUZE/BANDAGES/DRESSINGS) ×2 IMPLANT
SUT VIC AB 1 CT1 18XBRD ANBCTR (SUTURE) ×2 IMPLANT
SUT VIC AB 1 CT1 8-18 (SUTURE) ×4
SUT VIC AB 2-0 CP2 18 (SUTURE) ×4 IMPLANT
SYRINGE 20CC LL (MISCELLANEOUS) ×1 IMPLANT
TAPE CLOTH SURG 4X10 WHT LF (GAUZE/BANDAGES/DRESSINGS) ×1 IMPLANT
TOWEL GREEN STERILE (TOWEL DISPOSABLE) ×2 IMPLANT
TOWEL GREEN STERILE FF (TOWEL DISPOSABLE) ×2 IMPLANT
TRAY FOLEY W/METER SILVER 16FR (SET/KITS/TRAYS/PACK) ×2 IMPLANT
WATER STERILE IRR 1000ML POUR (IV SOLUTION) ×2 IMPLANT

## 2017-03-16 NOTE — Transfer of Care (Signed)
Immediate Anesthesia Transfer of Care Note  Patient: Craig [name]  Procedure(s) Performed: Procedure(s): POSTERIOR LUMBAR INTERBODY,  FUSION 1 LEVEL, INTERBODY PROSTHESIS, POSTERIOR LATERAL ARTHRODESIS, POSTERIOR NON SEGMENTAL INSTRUMENTATION. LUMBAR FOUR-FIVE (N/A)  Patient Location: PACU  Anesthesia Type:General  Level of Consciousness: awake, alert  and oriented  Airway & Oxygen Therapy: Patient Spontanous Breathing and Patient connected to nasal cannula oxygen  Post-op Assessment: Report given to RN, Post -op Vital signs reviewed and stable and Patient moving all extremities X 4  Post vital signs: Reviewed and stable  Last Vitals:  Vitals:   03/16/17 0653 03/16/17 1219  BP: (!) 169/61 (!) 171/100  Pulse:  91  Resp:  18  Temp:  36.4 C    Last Pain:  Vitals:   03/16/17 1219  TempSrc:   PainSc: 0-No pain         Complications: No apparent anesthesia complications

## 2017-03-16 NOTE — Anesthesia Postprocedure Evaluation (Signed)
Anesthesia Post Note  Patient: Vinayak Bobier  Procedure(s) Performed: Procedure(s) (LRB): POSTERIOR LUMBAR INTERBODY,  FUSION 1 LEVEL, INTERBODY PROSTHESIS, POSTERIOR LATERAL ARTHRODESIS, POSTERIOR NON SEGMENTAL INSTRUMENTATION. LUMBAR FOUR-FIVE (N/A)     Patient location during evaluation: PACU Anesthesia Type: General Level of consciousness: awake, awake and alert and oriented Pain management: pain level controlled Vital Signs Assessment: post-procedure vital signs reviewed and stable Respiratory status: spontaneous breathing and respiratory function stable Cardiovascular status: blood pressure returned to baseline Anesthetic complications: no    Last Vitals:  Vitals:   03/16/17 1424 03/16/17 1650  BP: (!) 177/80 (!) 189/70  Pulse: 75 70  Resp: 16 16  Temp: 36.6 C 36.7 C    Last Pain:  Vitals:   03/16/17 1740  TempSrc:   PainSc: 8                  Chellsie Gomer Rodriquez COKER

## 2017-03-16 NOTE — Progress Notes (Signed)
Orthopedic Tech Progress Note Patient Details:  Craig [name] 20-Aug-1946 [id number] Brace completed by bio-tech. Patient ID: Craig [name], male   DOB: 08-18-1946, 71 y.o.   MRN: [id number]   Craig [name] 03/16/2017, 3:24 PM

## 2017-03-16 NOTE — Anesthesia Procedure Notes (Addendum)
Procedure Name: Intubation Date/Time: 03/16/2017 8:46 AM Performed by: Ollen Bowl Pre-anesthesia Checklist: Patient identified, Emergency Drugs available, Suction available, Patient being monitored and Timeout performed Patient Re-evaluated:Patient Re-evaluated prior to inductionOxygen Delivery Method: Circle system utilized and Simple face mask Preoxygenation: Pre-oxygenation with 100% oxygen Intubation Type: Combination inhalational/ intravenous induction Ventilation: Mask ventilation without difficulty Laryngoscope Size: Miller and 3 Grade View: Grade II Tube type: Oral Tube size: 7.5 mm Number of attempts: 1 Airway Equipment and Method: Patient positioned with wedge pillow and Stylet Placement Confirmation: ETT inserted through vocal cords under direct vision,  positive ETCO2 and breath sounds checked- equal and bilateral Secured at: 22 cm Tube secured with: Tape Dental Injury: Teeth and Oropharynx as per pre-operative assessment  Difficulty Due To: Difficult Airway-  due to edematous airway

## 2017-03-16 NOTE — Anesthesia Preprocedure Evaluation (Addendum)
Anesthesia Evaluation    Airway Mallampati: II  TM Distance: >3 FB Neck ROM: Full    Dental  (+) Teeth Intact, Dental Advisory Given   Pulmonary Current Smoker,    breath sounds clear to auscultation       Cardiovascular hypertension,  Rhythm:Regular Rate:Normal     Neuro/Psych    GI/Hepatic   Endo/Other    Renal/GU      Musculoskeletal   Abdominal   Peds  Hematology   Anesthesia Other Findings   Reproductive/Obstetrics                            Anesthesia Physical Anesthesia Plan  ASA: III  Anesthesia Plan: General   Post-op Pain Management:    Induction: Intravenous  PONV Risk Score and Plan: Ondansetron  Airway Management Planned: Oral ETT  Additional Equipment:   Intra-op Plan:   Post-operative Plan: Extubation in OR  Informed Consent: I have reviewed the patients History and Physical, chart, labs and discussed the procedure including the risks, benefits and alternatives for the proposed anesthesia with the patient or authorized representative who has indicated his/her understanding and acceptance.   Dental advisory given  Plan Discussed with: CRNA and Anesthesiologist  Anesthesia Plan Comments:         Anesthesia Quick Evaluation

## 2017-03-16 NOTE — H&P (Signed)
Subjective: The patient is a 71 year old black male who is complaining of back, buttock and leg pain consistent with neurogenic claudication. He has failed medical management and was worked up with lumbar x-rays and lumbar MRI. 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 I discussed the various treatment options with the patient and his family. He has decided to proceed with surgery.   Past Medical History:  Diagnosis Date  . Allergic rhinitis   . DDD (degenerative disc disease), cervical   . Erectile dysfunction   . GERD (gastroesophageal reflux disease)   . Gouty arthritis   . Headache   . Hypercalcemia   . Hyperlipemia   . Hypertension   . Palpitations   . Spinal stenosis of cervical region   . Vitamin D deficiency     Past Surgical History:  Procedure Laterality Date  . COLONOSCOPY    . percutaneous transluminal balloon angioplasty  01/2010   of left lower extremity    Allergies  Allergen Reactions  . Lipitor [Atorvastatin] Other (See Comments)    MYALGIAS  . Lisinopril Cough  . Zetia [Ezetimibe] Other (See Comments)    MYALGIAS  . Protonix [Pantoprazole] Other (See Comments)    headache    Social History  Substance Use Topics  . Smoking status: Current Every Day Smoker    Packs/day: 1.00    Years: 50.00    Types: Cigarettes  . Smokeless tobacco: Former Systems developer  . Alcohol use 7.2 oz/week    12 Standard drinks or equivalent per week     Comment: 24 beers per weekend    Family History  Problem Relation Age of Onset  . Emphysema Father    Prior to Admission medications   Medication Sig Start Date End Date Taking? Authorizing Provider  cloNIDine (CATAPRES) 0.1 MG tablet Take 1 tablet (0.1 mg total) by mouth 2 (two) times daily. 10/12/16  Yes Carmon Ginsberg, PA  diltiazem (CARDIZEM CD) 300 MG 24 hr capsule Take 1 capsule (300 mg total) by mouth daily. 10/18/16  Yes Carmon Ginsberg, PA  gabapentin (NEURONTIN) 300 MG capsule Two capsules 3 x  day Patient taking differently: Take 300 mg by mouth 3 (three) times daily.  03/02/17  Yes Carmon Ginsberg, PA  hydrochlorothiazide (HYDRODIURIL) 25 MG tablet Take 1 tablet (25 mg total) by mouth daily. 10/12/16  Yes Chauvin, Herbie [place], PA  latanoprost (XALATAN) 0.005 % ophthalmic solution Place 1 drop into both eyes at bedtime.   Yes [provider]  losartan (COZAAR) 100 MG tablet Take 1 tablet (100 mg total) by mouth daily. 10/12/16  Yes Carmon Ginsberg, PA  omeprazole (PRILOSEC) 40 MG capsule Take 1 capsule (40 mg total) by mouth daily. 10/12/16  Yes Carmon Ginsberg, PA  pravastatin (PRAVACHOL) 40 MG tablet Take 1 tablet (40 mg total) by mouth daily. 10/12/16  Yes Carmon Ginsberg, PA  aspirin 81 MG tablet Take 81 mg by mouth daily.     [provider]  fluticasone (FLONASE) 50 MCG/ACT nasal spray Place 2 sprays into both nostrils daily. 06/21/16   Carmon Ginsberg, PA  gabapentin (NEURONTIN) 300 MG capsule Take 1 capsule (300 mg total) by mouth 3 (three) times daily. Patient taking differently: Take 300 mg by mouth 3 (three) times daily. 2 pills 3 x day 10/12/16   Carmon Ginsberg, PA  naproxen (NAPROSYN) 500 MG tablet Take 500 mg by mouth 2 (two) times daily with a meal. Reported on 03/31/2016 08/28/14   [provider]  OMEGA-3 FATTY ACIDS PO Take  1 tablet by mouth daily.     [provider]     Review of Systems  Positive ROS: As above  All other systems have been reviewed and were otherwise negative with the exception of those mentioned in the HPI and as above.  Objective: Vital signs in last 24 hours: Temp:  [98.8 F (37.1 C)] 98.8 F (37.1 C) (06/06 0645) Pulse Rate:  [83] 83 (06/06 0645) Resp:  [20] 20 (06/06 0645) BP: (169-203)/(61-67) 169/61 (06/06 0653) SpO2:  [100 %] 100 % (06/06 0645) Weight:  [86.5 kg (190 lb 11.2 oz)] 86.5 kg (190 lb 11.2 oz) (06/05 1215)  General Appearance: Alert Head: Normocephalic, without obvious abnormality, atraumatic Eyes:  PERRL, conjunctiva/corneas clear, EOM's intact,    Ears: Normal  Throat: Normal  Neck: Supple, Back: unremarkable Lungs: Clear to auscultation bilaterally, respirations unlabored Heart: Regular rate and rhythm, no murmur, rub or gallop Abdomen: Soft, non-tender Extremities: Extremities normal, atraumatic, no cyanosis or edema Skin: unremarkable  NEUROLOGIC:   Mental status: alert and oriented,Motor Exam - grossly normal Sensory Exam - grossly normal Reflexes:  Coordination - grossly normal Gait - grossly normal Balance - grossly normal Cranial Nerves: I: smell Not tested  II: visual acuity  OS: Normal  OD: Normal   II: visual fields Full to confrontation  II: pupils Equal, round, reactive to light  III,VII: ptosis None  III,IV,VI: extraocular muscles  Full ROM  V: mastication Normal  V: facial light touch sensation  Normal  V,VII: corneal reflex  Present  VII: facial muscle function - upper  Normal  VII: facial muscle function - lower Normal  VIII: hearing Not tested  IX: soft palate elevation  Normal  IX,X: gag reflex Present  XI: trapezius strength  5/5  XI: sternocleidomastoid strength 5/5  XI: neck flexion strength  5/5  XII: tongue strength  Normal    Data Review Lab Results  Component Value Date   WBC 6.1 03/09/2017   HGB 11.5 (L) 03/09/2017   HCT 36.4 (L) 03/09/2017   MCV 88.3 03/09/2017   PLT 293 03/09/2017   Lab Results  Component Value Date   NA 139 03/09/2017   K 4.3 03/09/2017   CL 107 03/09/2017   CO2 23 03/09/2017   BUN 14 03/09/2017   CREATININE 0.96 03/09/2017   GLUCOSE 114 (H) 03/09/2017   No results found for: INR, PROTIME  Assessment/Plan: L4-5 spondylolisthesis, spinal stenosis, lumbar radiculopathy, lumbago, neurogenic claudication: I have discussed the situation with the patient and reviewed his imaging studies with him. We have discussed the various treatment options including surgery. I have described the surgical treatment option  of an L4-5 decompression, instrumentation, and fusion. I have shown him surgical models. We have discussed the risks, benefits, alternatives, expected postoperative course, and likelihood of achieving her goals with surgery. I have answered all his questions. He has decided to proceed with surgery.   Brix Brearley D 03/16/2017 8:25 AM

## 2017-03-16 NOTE — Evaluation (Signed)
Physical Therapy Evaluation Patient Details Name: Craig [name] MRN: [id number] DOB: 1946-04-14 Today's Date: 03/16/2017   History of Present Illness  Patient is a 71 y/o male presents s/p Bilateral L4-5 Laminotomy/foraminotomies , L4-5 TLIF. PMH includes HTN, HLD.  Clinical Impression  Patient presents with pain and post surgical deficits s/p above surgery. Tolerated bed mobility, transfers and gait training with Min guard-Min A for balance/safety. Education re: back precautions, log roll technique, handout.Pt independent and working in nursery PTA. Pt has support from spouse at home. Will follow acutely to maximize independence and mobility prior to return home. Will plan for stair training tomorrow as tolerated.     Follow Up Recommendations No PT follow up;Supervision for mobility/OOB;Supervision/Assistance - 24 hour    Equipment Recommendations  None recommended by PT    Recommendations for Other Services       Precautions / Restrictions Precautions Precautions: Back Precaution Booklet Issued: Yes (comment) Precaution Comments: Reviewed handout and precautions Required Braces or Orthoses: Spinal Brace Spinal Brace: Lumbar corset;Applied in sitting position Restrictions Weight Bearing Restrictions: No      Mobility  Bed Mobility Overal bed mobility: Needs Assistance Bed Mobility: Rolling;Sidelying to Sit;Sit to Sidelying Rolling: Min guard Sidelying to sit: Min guard;HOB elevated     Sit to sidelying: Min guard;HOB elevated General bed mobility comments: Cues for log roll techinque, use of rail for support. No assist needed. No dizziness.  Transfers Overall transfer level: Needs assistance Equipment used: None Transfers: Sit to/from Stand Sit to Stand: Min guard         General transfer comment: Min guard to steady in standing. No dizziness.  Ambulation/Gait Ambulation/Gait assistance: Min guard Ambulation Distance (Feet): 150 Feet Assistive device:  (IV  pole) Gait Pattern/deviations: Step-through pattern;Decreased stride length Gait velocity: decreased Gait velocity interpretation: <1.8 ft/sec, indicative of risk for recurrent falls General Gait Details: Slow, mildly unsteady gait pushing IV pole for support.   Stairs            Wheelchair Mobility    Modified Rankin (Stroke Patients Only)       Balance Overall balance assessment: Needs assistance Sitting-balance support: No upper extremity supported;Feet supported Sitting balance-Leahy Scale: Good Sitting balance - Comments: Able to donn/doff brace with some assist.    Standing balance support: During functional activity Standing balance-Leahy Scale: Fair Standing balance comment: Able to stand unsupported statically, feels better with UE support for dynamic standing. Able to wash hands at sink without LOB.                             Pertinent Vitals/Pain Pain Assessment: 0-10 Pain Score: 8  Pain Location: back Pain Descriptors / Indicators: Sore;Operative site guarding Pain Intervention(s): Monitored during session;Repositioned    Home Living Family/patient expects to be discharged to:: Private residence Living Arrangements: Spouse/significant other Available Help at Discharge: Family;Available 24 hours/day Type of Home: House Home Access: Stairs to enter Entrance Stairs-Rails: Right Entrance Stairs-Number of Steps: 4 Home Layout: One level Home Equipment: None      Prior Function Level of Independence: Independent         Comments: Drives and works in a nursery. Loves to garden     Journalist, newspaper        Extremity/Trunk Assessment   Upper Extremity Assessment Upper Extremity Assessment: Defer to OT evaluation    Lower Extremity Assessment Lower Extremity Assessment: Generalized weakness    Cervical / Trunk Assessment Cervical /  Trunk Assessment: Other exceptions Cervical / Trunk Exceptions: s/p spine surgery  Communication    Communication: No difficulties  Cognition Arousal/Alertness: Awake/alert Behavior During Therapy: WFL for tasks assessed/performed Overall Cognitive Status: Within Functional Limits for tasks assessed                                 General Comments: Some memory deficits per pt report      General Comments General comments (skin integrity, edema, etc.): Niece present during session.     Exercises     Assessment/Plan    PT Assessment Patient needs continued PT services  PT Problem List Decreased strength;Decreased mobility;Decreased balance;Decreased knowledge of precautions;Pain;Decreased activity tolerance       PT Treatment Interventions Therapeutic activities;Gait training;Therapeutic exercise;Patient/family education;Functional mobility training;Balance training;DME instruction;Stair training    PT Goals (Current goals can be found in the Care Plan section)  Acute Rehab PT Goals Patient Stated Goal: to get back to gardening PT Goal Formulation: With patient Time For Goal Achievement: 03/30/17 Potential to Achieve Goals: Good    Frequency Min 5X/week   Barriers to discharge Inaccessible home environment stairs    Co-evaluation               AM-PAC PT "6 Clicks" Daily Activity  Outcome Measure Difficulty turning over in bed (including adjusting bedclothes, sheets and blankets)?: None Difficulty moving from lying on back to sitting on the side of the bed? : None Difficulty sitting down on and standing up from a chair with arms (e.g., wheelchair, bedside commode, etc,.)?: None Help needed moving to and from a bed to chair (including a wheelchair)?: A Little Help needed walking in hospital room?: A Little Help needed climbing 3-5 steps with a railing? : A Little 6 Click Score: 21    End of Session Equipment Utilized During Treatment: Back brace;Gait belt Activity Tolerance: Patient tolerated treatment well Patient left: in bed;with call  bell/phone within reach;with family/visitor present;with SCD's reapplied Nurse Communication: Mobility status PT Visit Diagnosis: Unsteadiness on feet (R26.81);Pain;Other abnormalities of gait and mobility (R26.89);Muscle weakness (generalized) (M62.81) Pain - part of body:  (back)    Time: 7371-0626 PT Time Calculation (min) (ACUTE ONLY): 19 min   Charges:   PT Evaluation $PT Eval Low Complexity: 1 Procedure     PT G Codes:        Wray Kearns, PT, DPT [phone]    Marguarite Arbour A Lanisa Ishler 03/16/2017, 4:25 PM

## 2017-03-16 NOTE — Progress Notes (Signed)
Orthopedic Tech Progress Note Patient Details:  Robert Sperl 01-25-1946 [id number]  Patient ID: [name], male   DOB: 1946-04-19, 71 y.o.   MRN: [id number]   Hildred Priest 03/16/2017, 2:54 PM Called in bio-tech brace order; spoke with Bella Kennedy

## 2017-03-16 NOTE — Op Note (Signed)
Brief history: The patient is a 71 year old black male who has complained of back, buttock and leg pain consistent with neurogenic claudication. He has failed medical management and was worked up with a lumbar MRI. This demonstrated a spondylolisthesis with severe spinal stenosis at L4-5. I discussed the various treatment options with the patient and his family. He has weighed the risks, benefits, and alternatives to surgery and decided to proceed with an L4-5 decompression, instrumentation, and fusion.  Preoperative diagnosis: L4-5 spondylolisthesis, spinal stenosis compressing both the L4 and the L5 nerve roots; lumbago; lumbar radiculopathy; neurogenic claudication  Postoperative diagnosis: The same  Procedure: Bilateral L4-5 Laminotomy/foraminotomies to decompress the bilateral L4 and L5 nerve roots(the work required to do this was in addition to the work required to do the posterior lumbar interbody fusion because of the patient's spinal stenosis, facet arthropathy. Etc. requiring a wide decompression of the nerve roots.); L4-5 transforaminal lumbar interbody fusion with local morselized autograft bone and Kinnex graft extender; insertion of interbody prosthesis at L4-5 (globus peek expandable interbody prosthesis); posterior nonsegmental instrumentation from L4 to L5 with globus titanium pedicle screws and rods; posterior lateral arthrodesis at L4-5 with local morselized autograft bone and Kinnex bone graft extender.  Surgeon: Dr. Earle Gell  Asst.: Dr. Ronnald Ramp  Anesthesia: Gen. endotracheal  Estimated blood loss: 200 mL  Drains: None  Complications: None  Description of procedure: The patient was brought to the operating room by the anesthesia team. General endotracheal anesthesia was induced. The patient was turned to the prone position on the Wilson frame. The patient's lumbosacral region was then prepared with Betadine scrub and Betadine solution. Sterile drapes were applied.  I then  injected the area to be incised with Marcaine with epinephrine solution. I then used the scalpel to make a linear midline incision over the L4-5 interspace. I then used electrocautery to perform a bilateral subperiosteal dissection exposing the spinous process and lamina of L4 and L5. We then obtained intraoperative radiograph to confirm our location. We then inserted the Verstrac retractor to provide exposure.  I began the decompression by using the high speed drill to perform laminotomies at L4-5 bilaterally. We then used the Kerrison punches to widen the laminotomy and removed the ligamentum flavum at L4-5 bilaterally. We used the Kerrison punches to remove the medial facets at L4-5 bilaterally. We performed wide foraminotomies about the bilateral L4 and L5 nerve roots completing the decompression.  We now turned our attention to the posterior lumbar interbody fusion. I used a scalpel to incise the intervertebral disc at L4-5 bilaterally. I then performed a partial intervertebral discectomy at L4-5 bilaterally using the pituitary forceps. We prepared the vertebral endplates at Q9-4 bilaterally for the fusion by removing the soft tissues with the curettes. We then used the trial spacers to pick the appropriate sized interbody prosthesis. We prefilled his prosthesis with a combination of local morselized autograft bone that we obtained during the decompression as well as Kinnex bone graft extender. We inserted the prefilled prosthesis into the interspace at L4-5 from the right, we then extended the prosthesis. There was a good snug fit of the prosthesis in the interspace. We then filled and the remainder of the intervertebral disc space with local morselized autograft bone and Kinnex. This completed the posterior lumbar interbody arthrodesis.  We now turned attention to the instrumentation. Under fluoroscopic guidance we cannulated the bilateral L4 and L5 pedicles with the bone probe. We then removed the bone  probe. We then tapped the pedicle with  a 6.5 millimeter tap. We then removed the tap. We probed inside the tapped pedicle with a ball probe to rule out cortical breaches. We then inserted a 7.5 x 45 and 50 millimeter pedicle screw into the L4 and L5 pedicles bilaterally under fluoroscopic guidance. We then palpated along the medial aspect of the pedicles to rule out cortical breaches. There were none. The nerve roots were not injured. We then connected the unilateral pedicle screws with a lordotic rod. We compressed the construct and secured the rod in place with the caps. We then tightened the caps appropriately. This completed the instrumentation from L4-5.  We now turned our attention to the posterior lateral arthrodesis at L4-5 bilaterally. We used the high-speed drill to decorticate the remainder of the facets, pars, transverse process at L4-5 bilaterally. We then applied a combination of local morselized autograft bone and Kinnex bone graft extender over these decorticated posterior lateral structures. This completed the posterior lateral arthrodesis.  We then obtained hemostasis using bipolar electrocautery. We irrigated the wound out with bacitracin solution. We inspected the thecal sac and nerve roots and noted they were well decompressed. We then removed the retractor. We placed vancomycin powder in the wound. We reapproximated patient's thoracolumbar fascia with interrupted #1 Vicryl suture. We reapproximated patient's subcutaneous tissue with interrupted 2-0 Vicryl suture. The reapproximated patient's skin with Steri-Strips and benzoin. The wound was then coated with bacitracin ointment. A sterile dressing was applied. The drapes were removed. The patient was subsequently returned to the supine position where they were extubated by the anesthesia team. He was then transported to the post anesthesia care unit in stable condition. All sponge instrument and needle counts were reportedly correct at the  end of this case.

## 2017-03-17 LAB — CBC
HCT: 31.8 % — ABNORMAL LOW (ref 39.0–52.0)
Hemoglobin: 10.1 g/dL — ABNORMAL LOW (ref 13.0–17.0)
MCH: 28.3 pg (ref 26.0–34.0)
MCHC: 31.8 g/dL (ref 30.0–36.0)
MCV: 89.1 fL (ref 78.0–100.0)
Platelets: 234 10*3/uL (ref 150–400)
RBC: 3.57 MIL/uL — ABNORMAL LOW (ref 4.22–5.81)
RDW: 15.5 % (ref 11.5–15.5)
WBC: 13.1 10*3/uL — ABNORMAL HIGH (ref 4.0–10.5)

## 2017-03-17 LAB — BASIC METABOLIC PANEL
Anion gap: 12 (ref 5–15)
BUN: 13 mg/dL (ref 6–20)
CO2: 24 mmol/L (ref 22–32)
Calcium: 9.5 mg/dL (ref 8.9–10.3)
Chloride: 101 mmol/L (ref 101–111)
Creatinine, Ser: 0.96 mg/dL (ref 0.61–1.24)
GFR calc Af Amer: >60 mL/min (ref >60)
GFR calc non Af Amer: >60 mL/min (ref >60)
Glucose, Bld: 132 mg/dL — ABNORMAL HIGH (ref 65–99)
Potassium: 4.4 mmol/L (ref 3.5–5.1)
Sodium: 137 mmol/L (ref 135–145)

## 2017-03-17 MED ORDER — DOCUSATE SODIUM 100 MG PO CAPS: 100.0000 mg | ORAL_CAPSULE | Freq: Two times a day (BID) | ORAL | 0 refills | Status: DC

## 2017-03-17 MED ORDER — OXYCODONE HCL 5 MG PO TABS: 5.0000 mg | ORAL_TABLET | ORAL | 0 refills | Status: DC | PRN

## 2017-03-17 MED ORDER — CYCLOBENZAPRINE HCL 10 MG PO TABS: 10.0000 mg | ORAL_TABLET | Freq: Three times a day (TID) | ORAL | 1 refills | Status: DC | PRN

## 2017-03-17 NOTE — Discharge Summary (Signed)
Physician Discharge Summary  Patient ID: Craig [name] MRN: [id number] DOB/AGE: January 10, 1946 71 y.o.  Admit date: 03/16/2017 Discharge date: 03/17/2017  Admission Diagnoses:L4-5 spondylolisthesis, spinal stenosis, lumbago, lumbar radiculopathy, neurogenic claudication  Discharge Diagnoses: The same Active Problems:   Spondylolisthesis, lumbar region   Discharged Condition: good  Hospital Course: I performed an L4-5 decompression, instrumentation, and fusion on the patient on 03/16/2017. The surgery went well.  The patient's postoperative course was unremarkable. He wants to go home on 03/18/2017. I gave him written and oral discharge instructions. All his questions were answered.  Consults: Physical therapy Significant Diagnostic Studies: None Treatments: L4-5 decompression, instrumentation, and function. Discharge Exam: Blood pressure (!) 146/72, pulse 68, temperature 98.3 F (36.8 C), resp. rate 18, height 5\' 9"  (1.753 m), weight 86.5 kg (190 lb 11.2 oz), SpO2 100 %. The patient is alert and pleasant. He looks well. His strength is normal in his lower extremities.  Disposition: Home   Allergies as of 03/17/2017      Reactions   Lipitor [atorvastatin] Other (See Comments)   MYALGIAS   Lisinopril Cough   Zetia [ezetimibe] Other (See Comments)   MYALGIAS   Protonix [pantoprazole] Other (See Comments)   headache      Medication List    STOP taking these medications   naproxen 500 MG tablet Commonly known as:  NAPROSYN     TAKE these medications   aspirin 81 MG tablet Take 81 mg by mouth daily.   cloNIDine 0.1 MG tablet Commonly known as:  CATAPRES Take 1 tablet (0.1 mg total) by mouth 2 (two) times daily.   cyclobenzaprine 10 MG tablet Commonly known as:  FLEXERIL Take 1 tablet (10 mg total) by mouth 3 (three) times daily as needed for muscle spasms.   diltiazem 300 MG 24 hr capsule Commonly known as:  CARDIZEM CD Take 1 capsule (300 mg total) by mouth daily.    docusate sodium 100 MG capsule Commonly known as:  COLACE Take 1 capsule (100 mg total) by mouth 2 (two) times daily.   fluticasone 50 MCG/ACT nasal spray Commonly known as:  FLONASE Place 2 sprays into both nostrils daily.   gabapentin 300 MG capsule Commonly known as:  NEURONTIN Take 1 capsule (300 mg total) by mouth 3 (three) times daily. What changed:  additional instructions   gabapentin 300 MG capsule Commonly known as:  NEURONTIN Two capsules 3 x day What changed:  how much to take  how to take this  when to take this  additional instructions   hydrochlorothiazide 25 MG tablet Commonly known as:  HYDRODIURIL Take 1 tablet (25 mg total) by mouth daily.   latanoprost 0.005 % ophthalmic solution Commonly known as:  XALATAN Place 1 drop into both eyes at bedtime.   losartan 100 MG tablet Commonly known as:  COZAAR Take 1 tablet (100 mg total) by mouth daily.   OMEGA-3 FATTY ACIDS PO Take 1 tablet by mouth daily.   omeprazole 40 MG capsule Commonly known as:  PRILOSEC Take 1 capsule (40 mg total) by mouth daily.   oxyCODONE 5 MG immediate release tablet Commonly known as:  Oxy IR/ROXICODONE Take 1-2 tablets (5-10 mg total) by mouth every 3 (three) hours as needed for breakthrough pain.   pravastatin 40 MG tablet Commonly known as:  PRAVACHOL Take 1 tablet (40 mg total) by mouth daily.        SignedNewman Pies D 03/17/2017, 9:59 AM

## 2017-03-17 NOTE — Progress Notes (Signed)
Physical Therapy Treatment Patient Details Name: Craig [name] MRN: [id number] DOB: [birth year] Today's Date: 03/17/2017    History of Present Illness Patient is a 71 y/o male presents s/p Bilateral L4-5 Laminotomy/foraminotomies , L4-5 TLIF. PMH includes HTN, HLD.    PT Comments    Pt progressing towards physical therapy goals. Was able to perform transfers and ambulation with min guard to supervision for safety. Pt reports feeling better today and is anticipating return home today. Will continue to follow and progress as able per POC.    Follow Up Recommendations  No PT follow up;Supervision for mobility/OOB;Supervision/Assistance - 24 hour     Equipment Recommendations  None recommended by PT    Recommendations for Other Services       Precautions / Restrictions Precautions Precautions: Back Precaution Booklet Issued: Yes (comment) Precaution Comments: Reviewed precautions verbally throughout functional mobility.  Required Braces or Orthoses: Spinal Brace Spinal Brace: Lumbar corset;Applied in sitting position Restrictions Weight Bearing Restrictions: No    Mobility  Bed Mobility Overal bed mobility: Needs Assistance Bed Mobility: Rolling;Sidelying to Sit Rolling: Modified independent (Device/Increase time) Sidelying to sit: Supervision       General bed mobility comments: Supervision and cues for proper log roll technique.   Transfers Overall transfer level: Needs assistance Equipment used: None Transfers: Sit to/from Stand Sit to Stand: Supervision         General transfer comment: Supervision for safety. Pt steady but with poor posture and maintenance of precautions.   Ambulation/Gait Ambulation/Gait assistance: Min guard Ambulation Distance (Feet): 400 Feet Assistive device: None Gait Pattern/deviations: Step-through pattern;Decreased stride length Gait velocity: decreased Gait velocity interpretation: Below normal speed for age/gender General Gait  Details: Generally slow but steady. VC's for improved posture.    Stairs Stairs: Yes   Stair Management: One rail Left;Step to pattern;Forwards Number of Stairs: 10    Wheelchair Mobility    Modified Rankin (Stroke Patients Only)       Balance Overall balance assessment: Needs assistance Sitting-balance support: No upper extremity supported;Feet supported Sitting balance-Leahy Scale: Good Sitting balance - Comments: Able to don/doff brace with some assist.    Standing balance support: During functional activity Standing balance-Leahy Scale: Fair                              Cognition Arousal/Alertness: Awake/alert Behavior During Therapy: WFL for tasks assessed/performed Overall Cognitive Status: Within Functional Limits for tasks assessed                                 General Comments: Some memory deficits per pt report      Exercises      General Comments        Pertinent Vitals/Pain Pain Assessment: Faces Faces Pain Scale: Hurts a little bit Pain Location: Incision site Pain Descriptors / Indicators: Sore;Operative site guarding Pain Intervention(s): Limited activity within patient's tolerance;Monitored during session;Repositioned    Home Living                      Prior Function            PT Goals (current goals can now be found in the care plan section) Acute Rehab PT Goals Patient Stated Goal: to get back to gardening PT Goal Formulation: With patient Time For Goal Achievement: 03/30/17 Potential to Achieve Goals: Good Progress towards PT goals:  Progressing toward goals    Frequency    Min 5X/week      PT Plan Current plan remains appropriate    Co-evaluation              AM-PAC PT "6 Clicks" Daily Activity  Outcome Measure  Difficulty turning over in bed (including adjusting bedclothes, sheets and blankets)?: None Difficulty moving from lying on back to sitting on the side of the bed? :  None Difficulty sitting down on and standing up from a chair with arms (e.g., wheelchair, bedside commode, etc,.)?: None Help needed moving to and from a bed to chair (including a wheelchair)?: A Little Help needed walking in hospital room?: A Little Help needed climbing 3-5 steps with a railing? : A Little 6 Click Score: 21    End of Session Equipment Utilized During Treatment: Back brace;Gait belt Activity Tolerance: Patient tolerated treatment well Patient left: in bed;with call bell/phone within reach;with family/visitor present;with SCD's reapplied Nurse Communication: Mobility status PT Visit Diagnosis: Unsteadiness on feet (R26.81);Pain;Other abnormalities of gait and mobility (R26.89);Muscle weakness (generalized) (M62.81) Pain - part of body:  (back)     Time: 8341-9622 PT Time Calculation (min) (ACUTE ONLY): 12 min  Charges:  $Gait Training: 8-22 mins                    G Codes:       Rolinda Roan, PT, DPT Acute Rehabilitation Services Pager: [phone]    Thelma Comp 03/17/2017, 10:19 AM

## 2017-03-18 ENCOUNTER — Telehealth: Payer: Self-pay | Admitting: Family Medicine

## 2017-03-18 NOTE — Progress Notes (Signed)
Dc instructions given to pt at this time.  Pt has no s/s of any acute distress.  Waiting for niece to pick him up

## 2017-03-18 NOTE — Progress Notes (Signed)
Pt left via wc to car, accompanied by volunteer, wife, and niece.

## 2017-03-18 NOTE — Telephone Encounter (Signed)
ROI-Tajique. [facility] ( short stay center surgical ) faxed to [facility]   [facility] faxed the following:  [phone]  Treadmill Test 2009  Echo 2012

## 2017-03-18 NOTE — Progress Notes (Signed)
Physical Therapy Treatment and Discharge Patient Details Name: Craig [name] MRN: [id number] DOB: 02/23/1946 Today's Date: 03/18/2017    History of Present Illness Patient is a 71 y/o male presents s/p Bilateral L4-5 Laminotomy/foraminotomies , L4-5 TLIF. PMH includes HTN, HLD.    PT Comments    Pt progressing well with mobility. He was able to demonstrate functional mobility at a gross mod I level. Pt was educated on precautions, brace application/wearing schedule, walking program, and car transfer. Will sign off at this time. If needs change, please reconsult.    Follow Up Recommendations  No PT follow up;Supervision for mobility/OOB;Supervision/Assistance - 24 hour     Equipment Recommendations  None recommended by PT    Recommendations for Other Services       Precautions / Restrictions Precautions Precautions: Back Precaution Booklet Issued: Yes (comment) Precaution Comments: Reviewed precautions verbally throughout functional mobility.  Required Braces or Orthoses: Spinal Brace Spinal Brace: Lumbar corset;Applied in sitting position Restrictions Weight Bearing Restrictions: No    Mobility  Bed Mobility               General bed mobility comments: Pt sitting up on EOB when PT arrived. To recliner at end of session.   Transfers Overall transfer level: Modified independent Equipment used: None Transfers: Sit to/from Stand           General transfer comment: Pt completed transition to standing without assist. Increased time required due to pain.   Ambulation/Gait Ambulation/Gait assistance: Modified independent (Device/Increase time) Ambulation Distance (Feet): 300 Feet Assistive device: None Gait Pattern/deviations: Step-through pattern;Decreased stride length Gait velocity: decreased Gait velocity interpretation: Below normal speed for age/gender General Gait Details: Generally slow but steady. VC's for improved posture.    Stairs Stairs: Yes    Stair Management: One rail Left;Alternating pattern Number of Stairs: 10 General stair comments: Pt demonstrated good technique. No unsteadiness noted.   Wheelchair Mobility    Modified Rankin (Stroke Patients Only)       Balance Overall balance assessment: Needs assistance Sitting-balance support: No upper extremity supported;Feet supported Sitting balance-Leahy Scale: Good Sitting balance - Comments: Able to don/doff brace with some assist.    Standing balance support: During functional activity Standing balance-Leahy Scale: Fair                              Cognition Arousal/Alertness: Awake/alert Behavior During Therapy: WFL for tasks assessed/performed Overall Cognitive Status: Within Functional Limits for tasks assessed                                 General Comments: Some memory deficits per pt report      Exercises      General Comments        Pertinent Vitals/Pain Pain Assessment: Faces Faces Pain Scale: Hurts a little bit Pain Location: Incision site Pain Descriptors / Indicators: Sore;Operative site guarding Pain Intervention(s): Limited activity within patient's tolerance;Monitored during session;Repositioned    Home Living                      Prior Function            PT Goals (current goals can now be found in the care plan section) Acute Rehab PT Goals Patient Stated Goal: to get back to gardening PT Goal Formulation: With patient Time For Goal Achievement: 03/30/17 Potential to Achieve  Goals: Good Progress towards PT goals: Progressing toward goals    Frequency    Min 5X/week      PT Plan Current plan remains appropriate    Co-evaluation              AM-PAC PT "6 Clicks" Daily Activity  Outcome Measure  Difficulty turning over in bed (including adjusting bedclothes, sheets and blankets)?: None Difficulty moving from lying on back to sitting on the side of the bed? : None Difficulty  sitting down on and standing up from a chair with arms (e.g., wheelchair, bedside commode, etc,.)?: None Help needed moving to and from a bed to chair (including a wheelchair)?: A Little Help needed walking in hospital room?: A Little Help needed climbing 3-5 steps with a railing? : A Little 6 Click Score: 21    End of Session Equipment Utilized During Treatment: Back brace Activity Tolerance: Patient tolerated treatment well Patient left: in chair;with call bell/phone within reach Nurse Communication: Mobility status PT Visit Diagnosis: Unsteadiness on feet (R26.81);Pain;Other abnormalities of gait and mobility (R26.89);Muscle weakness (generalized) (M62.81) Pain - part of body:  (back)     Time: 4469-5072 PT Time Calculation (min) (ACUTE ONLY): 12 min  Charges:  $Gait Training: 8-22 mins                    G Codes:       Rolinda Roan, PT, DPT Acute Rehabilitation Services Pager: [phone]    Thelma Comp 03/18/2017, 9:13 AM

## 2017-04-12 DIAGNOSIS — M4316 Spondylolisthesis, lumbar region: Secondary | ICD-10-CM | POA: Diagnosis not present

## 2017-04-15 ENCOUNTER — Encounter: Payer: Self-pay | Admitting: Family Medicine

## 2017-04-15 ENCOUNTER — Ambulatory Visit (INDEPENDENT_AMBULATORY_CARE_PROVIDER_SITE_OTHER): Payer: Medicare Other | Admitting: Family Medicine

## 2017-04-15 VITALS — BP 124/60 | HR 98 | Temp 98.1°F | Resp 16 | Wt 186.0 lb

## 2017-04-15 DIAGNOSIS — R42 Dizziness and giddiness: Secondary | ICD-10-CM

## 2017-04-15 NOTE — Patient Instructions (Signed)
Stop HCTZ and see if dizziness improves. If not improving return in a week.

## 2017-04-15 NOTE — Progress Notes (Signed)
Subjective:     Patient ID: Craig [name], male   DOB: Mar 05, 1946, 71 y.o.   MRN: [id number]  HPI  Chief Complaint  Patient presents with  . Dizziness    for a couple of weeks, since back surgery. He says it only happens every once in a while when he stands up. His back surgeon told him he needed to get it checked out.   States surgery was a success and he no longer is having leg pain. Currently wearing a lumbar orthotic. States in addition to his bp medication he is taking gabapentin 600 mg twice daily (forgets evening dose), 1/2 of a Flexeril in the AM, and 1/2 of a hydrocodone in the AM. Reports dizziness is primarily for sitting to standing but also reports after he has been up walking first thing in the AM he will get dizzy for a few minutes (up to 10 minutes).   Review of Systems     Objective:   Physical Exam  Constitutional: He appears well-developed and well-nourished. No distress.  Cardiovascular: Normal rate and regular rhythm.   Pulmonary/Chest: Breath sounds normal.  Musculoskeletal: He exhibits no edema (of lower extremities).       Assessment:    1. Dizziness: will stop HCTZ    Plan:    Do f/u in a week if dizziness not improving off HCTZ. Schedule for a physical when recovered from surgery.

## 2017-05-12 ENCOUNTER — Encounter: Payer: Self-pay | Admitting: Family Medicine

## 2017-06-17 ENCOUNTER — Ambulatory Visit (INDEPENDENT_AMBULATORY_CARE_PROVIDER_SITE_OTHER): Payer: Medicare Other | Admitting: Family Medicine

## 2017-06-17 ENCOUNTER — Encounter: Payer: Self-pay | Admitting: Family Medicine

## 2017-06-17 VITALS — BP 120/74 | HR 78 | Temp 98.4°F | Resp 16 | Wt 188.4 lb

## 2017-06-17 DIAGNOSIS — M109 Gout, unspecified: Secondary | ICD-10-CM | POA: Diagnosis not present

## 2017-06-17 LAB — RENAL FUNCTION PANEL
Albumin: 4.2 g/dL (ref 3.6–5.1)
BUN: 13 mg/dL (ref 7–25)
CO2: 24 mmol/L (ref 20–32)
Calcium: 9.6 mg/dL (ref 8.6–10.3)
Chloride: 107 mmol/L (ref 98–110)
Creat: 0.95 mg/dL (ref 0.70–1.18)
Glucose, Bld: 96 mg/dL (ref 65–99)
Phosphorus: 3.5 mg/dL (ref 2.1–4.3)
Potassium: 4.5 mmol/L (ref 3.5–5.3)
Sodium: 139 mmol/L (ref 135–146)

## 2017-06-17 LAB — URIC ACID: Uric Acid, Serum: 6.4 mg/dL (ref 4.0–8.0)

## 2017-06-17 LAB — SPECIMEN ID NOTIFICATION MISSING 2ND ID

## 2017-06-17 MED ORDER — PREDNISONE 10 MG PO TABS: ORAL_TABLET | ORAL | 0 refills | Status: DC

## 2017-06-17 NOTE — Progress Notes (Signed)
Subjective:     Patient ID: Craig [name], male   DOB: 1946-06-27, 71 y.o.   MRN: [id number]  HPI  Chief Complaint  Patient presents with  . Foot Swelling    Patient comes in office today with compaints of swelling of his great toe on his left foot for one week.   States he resumed drinking beer on the weekend but has discontinued HCTZ per last office visit. States left great toe at MTP joint was very swollen and tender earlier in the week but has improved. Denies injury. Uses tramadol at night for pain/sleep.   Review of Systems     Objective:   Physical Exam  Constitutional: He appears well-developed and well-nourished. No distress.  Skin:  Left first MTP joint mildly swollen and tender. No erythema       Assessment:    1. Gouty arthritis of left great toe - Renal function panel - Uric acid - predniSONE (DELTASONE) 10 MG tablet; Taper daily as follows: 6 pills, 5, 4, 3, 2, 1  Dispense: 21 tablet; Refill: 0    Plan:    Further f/u pending lab work.

## 2017-06-17 NOTE — Patient Instructions (Addendum)
We will call you with the lab results. Gout Gout is painful swelling that can occur in some of your joints. Gout is a type of arthritis. This condition is caused by having too much uric acid in your body. Uric acid is a chemical that forms when your body breaks down substances called purines. Purines are important for building body proteins. When your body has too much uric acid, sharp crystals can form and build up inside your joints. This causes pain and swelling. Gout attacks can happen quickly and be very painful (acute gout). Over time, the attacks can affect more joints and become more frequent (chronic gout). Gout can also cause uric acid to build up under your skin and inside your kidneys. What are the causes? This condition is caused by too much uric acid in your blood. This can occur because:  Your kidneys do not remove enough uric acid from your blood. This is the most common cause.  Your body makes too much uric acid. This can occur with some cancers and cancer treatments. It can also occur if your body is breaking down too many red blood cells (hemolytic anemia).  You eat too many foods that are high in purines. These foods include organ meats and some seafood. Alcohol, especially beer, is also high in purines.  A gout attack may be triggered by trauma or stress. What increases the risk? This condition is more likely to develop in people who:  Have a family history of gout.  Are male and middle-aged.  Are male and have gone through menopause.  Are obese.  Frequently drink alcohol, especially beer.  Are dehydrated.  Lose weight too quickly.  Have an organ transplant.  Have lead poisoning.  Take certain medicines, including aspirin, cyclosporine, diuretics, levodopa, and niacin.  Have kidney disease or psoriasis.  What are the signs or symptoms? An attack of acute gout happens quickly. It usually occurs in just one joint. The most common place is the big toe.  Attacks often start at night. Other joints that may be affected include joints of the feet, ankle, knee, fingers, wrist, or elbow. Symptoms may include:  Severe pain.  Warmth.  Swelling.  Stiffness.  Tenderness. The affected joint may be very painful to touch.  Shiny, red, or purple skin.  Chills and fever.  Chronic gout may cause symptoms more frequently. More joints may be involved. You may also have white or yellow lumps (tophi) on your hands or feet or in other areas near your joints. How is this diagnosed? This condition is diagnosed based on your symptoms, medical history, and physical exam. You may have tests, such as:  Blood tests to measure uric acid levels.  Removal of joint fluid with a needle (aspiration) to look for uric acid crystals.  X-rays to look for joint damage.  How is this treated? Treatment for this condition has two phases: treating an acute attack and preventing future attacks. Acute gout treatment may include medicines to reduce pain and swelling, including:  NSAIDs.  Steroids. These are strong anti-inflammatory medicines that can be taken by mouth (orally) or injected into a joint.  Colchicine. This medicine relieves pain and swelling when it is taken soon after an attack. It can be given orally or through an IV tube.  Preventive treatment may include:  Daily use of smaller doses of NSAIDs or colchicine.  Use of a medicine that reduces uric acid levels in your blood.  Changes to your diet. You may need  to see a specialist about healthy eating (dietitian).  Follow these instructions at home: During a Gout Attack  If directed, apply ice to the affected area: ? Put ice in a plastic bag. ? Place a towel between your skin and the bag. ? Leave the ice on for 20 minutes, 2-3 times a day.  Rest the joint as much as possible. If the affected joint is in your leg, you may be given crutches to use.  Raise (elevate) the affected joint above the  level of your heart as often as possible.  Drink enough fluids to keep your urine clear or pale yellow.  Take over-the-counter and prescription medicines only as told by your health care provider.  Do not drive or operate heavy machinery while taking prescription pain medicine.  Follow instructions from your health care provider about eating or drinking restrictions.  Return to your normal activities as told by your health care provider. Ask your health care provider what activities are safe for you. Avoiding Future Gout Attacks  Follow a low-purine diet as told by your dietitian or health care provider. Avoid foods and drinks that are high in purines, including liver, kidney, anchovies, asparagus, herring, mushrooms, mussels, and beer.  Limit alcohol intake to no more than 1 drink a day for nonpregnant women and 2 drinks a day for men. One drink equals 12 oz of beer, 5 oz of wine, or 1 oz of hard liquor.  Maintain a healthy weight or lose weight if you are overweight. If you want to lose weight, talk with your health care provider. It is important that you do not lose weight too quickly.  Start or maintain an exercise program as told by your health care provider.  Drink enough fluids to keep your urine clear or pale yellow.  Take over-the-counter and prescription medicines only as told by your health care provider.  Keep all follow-up visits as told by your health care provider. This is important. Contact a health care provider if:  You have another gout attack.  You continue to have symptoms of a gout attack after10 days of treatment.  You have side effects from your medicines.  You have chills or a fever.  You have burning pain when you urinate.  You have pain in your lower back or belly. Get help right away if:  You have severe or uncontrolled pain.  You cannot urinate. This information is not intended to replace advice given to you by your health care provider. Make  sure you discuss any questions you have with your health care provider. Document Released: 09/24/2000 Document Revised: 03/04/2016 Document Reviewed: 07/10/2015 Elsevier Interactive Patient Education  2017 [employer].

## 2017-06-20 ENCOUNTER — Other Ambulatory Visit: Payer: Self-pay | Admitting: Family Medicine

## 2017-06-20 ENCOUNTER — Telehealth: Payer: Self-pay

## 2017-06-20 NOTE — Telephone Encounter (Signed)
-----   Message from Carmon Ginsberg, [place] sent at 06/20/2017  7:43 AM EDT ----- Your uric acid level is not very high. If you can cut down your beer intake on the weekend (2 beers/day) we may not need to add another medication.

## 2017-06-20 NOTE — Telephone Encounter (Signed)
Pt advised.   Thanks,   -Laura  

## 2017-07-18 ENCOUNTER — Ambulatory Visit: Payer: Self-pay | Admitting: Family Medicine

## 2017-08-03 DIAGNOSIS — H40003 Preglaucoma, unspecified, bilateral: Secondary | ICD-10-CM | POA: Diagnosis not present

## 2017-08-05 DIAGNOSIS — I10 Essential (primary) hypertension: Secondary | ICD-10-CM | POA: Diagnosis not present

## 2017-08-05 DIAGNOSIS — M545 Low back pain: Secondary | ICD-10-CM | POA: Diagnosis not present

## 2017-08-10 ENCOUNTER — Other Ambulatory Visit: Payer: Self-pay | Admitting: Family Medicine

## 2017-08-10 ENCOUNTER — Encounter: Payer: Self-pay | Admitting: Family Medicine

## 2017-08-10 ENCOUNTER — Ambulatory Visit (INDEPENDENT_AMBULATORY_CARE_PROVIDER_SITE_OTHER): Payer: Medicare Other | Admitting: Family Medicine

## 2017-08-10 VITALS — BP 132/78 | HR 80 | Temp 98.0°F | Resp 16 | Wt 195.0 lb

## 2017-08-10 DIAGNOSIS — R079 Chest pain, unspecified: Secondary | ICD-10-CM | POA: Diagnosis not present

## 2017-08-10 DIAGNOSIS — Z23 Encounter for immunization: Secondary | ICD-10-CM

## 2017-08-10 NOTE — Progress Notes (Signed)
Subjective:     Patient ID: Craig [name], male   DOB: [birth year], 71 y.o.   MRN: [id number]  HPI  Chief Complaint  Patient presents with  . Chest Pain    x 2 weeks. Exertional. Pain starts in upper-mid abdomen and radiates to chest. Also c/o palpitations. Denies SOB. States the pain is similar to acid reflux, but only occurs with exertion. Denies N/V. Reports there is some right thoracic radiation.  States he notices t when walking a distance and up hills. Rarely will occur nonexertionally. Associates it with palpitations and usually will sit down until it passes. Reports it is different from his heartburn pain which is controlled with omeprazole.   Review of Systems     Objective:   Physical Exam  Constitutional: He appears well-developed and well-nourished. No distress.  Cardiovascular: Normal rate and regular rhythm.   Pulmonary/Chest: Breath sounds normal.  Musculoskeletal: He exhibits no edema (of lower extremities).       Assessment:    1. Exertional chest pain - EKG 12-Lead - Ambulatory referral to Cardiology  2. Need for influenza vaccination - Flu vaccine HIGH DOSE PF    Plan:    Further f/u pending cardiology evaluation. He will continue ASA and curtail activity which provokes his sx pending cardiology referral.

## 2017-08-10 NOTE — Patient Instructions (Signed)
We will call you with the cardiology referral. 

## 2017-08-11 DIAGNOSIS — E782 Mixed hyperlipidemia: Secondary | ICD-10-CM | POA: Diagnosis not present

## 2017-08-11 DIAGNOSIS — R0602 Shortness of breath: Secondary | ICD-10-CM | POA: Insufficient documentation

## 2017-08-11 DIAGNOSIS — Z720 Tobacco use: Secondary | ICD-10-CM | POA: Diagnosis not present

## 2017-08-11 DIAGNOSIS — R079 Chest pain, unspecified: Secondary | ICD-10-CM | POA: Diagnosis not present

## 2017-08-11 DIAGNOSIS — I10 Essential (primary) hypertension: Secondary | ICD-10-CM | POA: Diagnosis not present

## 2017-08-23 DIAGNOSIS — R079 Chest pain, unspecified: Secondary | ICD-10-CM | POA: Diagnosis not present

## 2017-08-23 DIAGNOSIS — Z01818 Encounter for other preprocedural examination: Secondary | ICD-10-CM | POA: Diagnosis not present

## 2017-08-23 DIAGNOSIS — R0602 Shortness of breath: Secondary | ICD-10-CM | POA: Diagnosis not present

## 2017-08-23 DIAGNOSIS — I208 Other forms of angina pectoris: Secondary | ICD-10-CM | POA: Diagnosis not present

## 2017-08-23 DIAGNOSIS — I10 Essential (primary) hypertension: Secondary | ICD-10-CM | POA: Diagnosis not present

## 2017-08-29 DIAGNOSIS — I208 Other forms of angina pectoris: Secondary | ICD-10-CM | POA: Diagnosis present

## 2017-08-30 ENCOUNTER — Ambulatory Visit: Admission: RE | Admit: 2017-08-30 | Payer: Medicare Other | Source: Ambulatory Visit | Admitting: Internal Medicine

## 2017-08-30 SURGERY — LEFT HEART CATH AND CORONARY ANGIOGRAPHY
Anesthesia: Moderate Sedation

## 2017-09-01 ENCOUNTER — Other Ambulatory Visit: Payer: Self-pay | Admitting: Family Medicine

## 2017-09-06 ENCOUNTER — Ambulatory Visit
Admission: RE | Admit: 2017-09-06 | Discharge: 2017-09-06 | Disposition: A | Discharge status: Home / Self Care | Payer: Medicare Other | Source: Ambulatory Visit | Attending: Internal Medicine | Admitting: Internal Medicine

## 2017-09-06 ENCOUNTER — Encounter: Payer: Self-pay | Admitting: *Deleted

## 2017-09-06 ENCOUNTER — Encounter
Admission: RE | Disposition: A | Discharge status: Home / Self Care | Payer: Self-pay | Source: Ambulatory Visit | Attending: Internal Medicine

## 2017-09-06 DIAGNOSIS — F17220 Nicotine dependence, chewing tobacco, uncomplicated: Secondary | ICD-10-CM | POA: Insufficient documentation

## 2017-09-06 DIAGNOSIS — E785 Hyperlipidemia, unspecified: Secondary | ICD-10-CM | POA: Diagnosis not present

## 2017-09-06 DIAGNOSIS — Z7982 Long term (current) use of aspirin: Secondary | ICD-10-CM | POA: Diagnosis not present

## 2017-09-06 DIAGNOSIS — F17210 Nicotine dependence, cigarettes, uncomplicated: Secondary | ICD-10-CM | POA: Insufficient documentation

## 2017-09-06 DIAGNOSIS — E7800 Pure hypercholesterolemia, unspecified: Secondary | ICD-10-CM | POA: Insufficient documentation

## 2017-09-06 DIAGNOSIS — Z09 Encounter for follow-up examination after completed treatment for conditions other than malignant neoplasm: Secondary | ICD-10-CM | POA: Diagnosis not present

## 2017-09-06 DIAGNOSIS — R079 Chest pain, unspecified: Secondary | ICD-10-CM

## 2017-09-06 DIAGNOSIS — Z79899 Other long term (current) drug therapy: Secondary | ICD-10-CM | POA: Diagnosis not present

## 2017-09-06 DIAGNOSIS — R0602 Shortness of breath: Secondary | ICD-10-CM | POA: Insufficient documentation

## 2017-09-06 DIAGNOSIS — I2510 Atherosclerotic heart disease of native coronary artery without angina pectoris: Secondary | ICD-10-CM | POA: Insufficient documentation

## 2017-09-06 DIAGNOSIS — R000 Tachycardia, unspecified: Secondary | ICD-10-CM | POA: Diagnosis not present

## 2017-09-06 DIAGNOSIS — I10 Essential (primary) hypertension: Secondary | ICD-10-CM | POA: Diagnosis not present

## 2017-09-06 DIAGNOSIS — Z888 Allergy status to other drugs, medicaments and biological substances status: Secondary | ICD-10-CM | POA: Insufficient documentation

## 2017-09-06 DIAGNOSIS — R109 Unspecified abdominal pain: Secondary | ICD-10-CM | POA: Insufficient documentation

## 2017-09-06 DIAGNOSIS — I25110 Atherosclerotic heart disease of native coronary artery with unstable angina pectoris: Secondary | ICD-10-CM | POA: Diagnosis not present

## 2017-09-06 DIAGNOSIS — I200 Unstable angina: Secondary | ICD-10-CM | POA: Diagnosis present

## 2017-09-06 DIAGNOSIS — R9439 Abnormal result of other cardiovascular function study: Secondary | ICD-10-CM | POA: Diagnosis present

## 2017-09-06 HISTORY — PX: LEFT HEART CATH AND CORONARY ANGIOGRAPHY: CATH118249

## 2017-09-06 SURGERY — LEFT HEART CATH AND CORONARY ANGIOGRAPHY
Anesthesia: Moderate Sedation | Laterality: Left

## 2017-09-06 MED ORDER — SODIUM CHLORIDE 0.9 % WEIGHT BASED INFUSION: 1.0000 mL/kg/h | INTRAVENOUS | Status: DC

## 2017-09-06 MED ORDER — LABETALOL HCL 5 MG/ML IV SOLN
INTRAVENOUS | Status: AC
  Filled 2017-09-06: qty 4

## 2017-09-06 MED ORDER — SODIUM CHLORIDE 0.9 % IV SOLN: 250.0000 mL | INTRAVENOUS | Status: DC | PRN

## 2017-09-06 MED ORDER — ONDANSETRON HCL 4 MG/2ML IJ SOLN: 4.0000 mg | Freq: Four times a day (QID) | INTRAMUSCULAR | Status: DC | PRN

## 2017-09-06 MED ORDER — SODIUM CHLORIDE 0.9 % WEIGHT BASED INFUSION
3.0000 mL/kg/h | INTRAVENOUS | Status: AC
  Administered 2017-09-06: 3 mL/kg/h via INTRAVENOUS

## 2017-09-06 MED ORDER — SODIUM CHLORIDE 0.9% FLUSH: 3.0000 mL | INTRAVENOUS | Status: DC | PRN

## 2017-09-06 MED ORDER — SODIUM CHLORIDE 0.9% FLUSH: 3.0000 mL | Freq: Two times a day (BID) | INTRAVENOUS | Status: DC

## 2017-09-06 MED ORDER — FENTANYL CITRATE (PF) 100 MCG/2ML IJ SOLN
INTRAMUSCULAR | Status: AC
  Filled 2017-09-06: qty 2

## 2017-09-06 MED ORDER — MIDAZOLAM HCL 2 MG/2ML IJ SOLN
INTRAMUSCULAR | Status: AC
  Filled 2017-09-06: qty 2

## 2017-09-06 MED ORDER — ASPIRIN 81 MG PO CHEW: 81.0000 mg | CHEWABLE_TABLET | ORAL | Status: DC

## 2017-09-06 MED ORDER — IOPAMIDOL (ISOVUE-300) INJECTION 61%
INTRAVENOUS | Status: DC | PRN
  Administered 2017-09-06: 130 mL via INTRA_ARTERIAL

## 2017-09-06 MED ORDER — FENTANYL CITRATE (PF) 100 MCG/2ML IJ SOLN
INTRAMUSCULAR | Status: DC | PRN
  Administered 2017-09-06: 25 ug via INTRAVENOUS

## 2017-09-06 MED ORDER — HEPARIN (PORCINE) IN NACL 2-0.9 UNIT/ML-% IJ SOLN
INTRAMUSCULAR | Status: AC
  Filled 2017-09-06: qty 1000

## 2017-09-06 MED ORDER — LIDOCAINE HCL (PF) 1 % IJ SOLN
INTRAMUSCULAR | Status: AC
  Filled 2017-09-06: qty 30

## 2017-09-06 MED ORDER — MIDAZOLAM HCL 2 MG/2ML IJ SOLN
INTRAMUSCULAR | Status: DC | PRN
  Administered 2017-09-06: 1 mg via INTRAVENOUS

## 2017-09-06 MED ORDER — LABETALOL HCL 5 MG/ML IV SOLN
INTRAVENOUS | Status: DC | PRN
  Administered 2017-09-06: 20 mg via INTRAVENOUS

## 2017-09-06 MED ORDER — ACETAMINOPHEN 325 MG PO TABS: 650.0000 mg | ORAL_TABLET | ORAL | Status: DC | PRN

## 2017-09-06 SURGICAL SUPPLY — 9 items
CATH 5FR JL4 DIAGNOSTIC (CATHETERS) ×1 IMPLANT
CATH INFINITI 5FR ANG PIGTAIL (CATHETERS) ×1 IMPLANT
CATH INFINITI JR4 5F (CATHETERS) ×1 IMPLANT
DEVICE CLOSURE MYNXGRIP 5F (Vascular Products) ×1 IMPLANT
KIT MANI 3VAL PERCEP (MISCELLANEOUS) ×2 IMPLANT
NDL PERC 18GX7CM (NEEDLE) IMPLANT
NEEDLE PERC 18GX7CM (NEEDLE) ×2 IMPLANT
PACK CARDIAC CATH (CUSTOM PROCEDURE TRAY) ×2 IMPLANT
SHEATH PINNACLE 5F 10CM (SHEATH) ×1 IMPLANT

## 2017-09-06 NOTE — Discharge Instructions (Signed)

## 2017-09-07 ENCOUNTER — Encounter: Payer: Self-pay | Admitting: Internal Medicine

## 2017-09-13 DIAGNOSIS — I208 Other forms of angina pectoris: Secondary | ICD-10-CM | POA: Diagnosis not present

## 2017-09-13 DIAGNOSIS — E782 Mixed hyperlipidemia: Secondary | ICD-10-CM | POA: Diagnosis not present

## 2017-09-13 DIAGNOSIS — I25118 Atherosclerotic heart disease of native coronary artery with other forms of angina pectoris: Secondary | ICD-10-CM | POA: Diagnosis not present

## 2017-09-13 DIAGNOSIS — I10 Essential (primary) hypertension: Secondary | ICD-10-CM | POA: Diagnosis not present

## 2017-09-16 DIAGNOSIS — I2510 Atherosclerotic heart disease of native coronary artery without angina pectoris: Secondary | ICD-10-CM | POA: Insufficient documentation

## 2017-09-23 ENCOUNTER — Other Ambulatory Visit: Payer: Self-pay

## 2017-09-23 DIAGNOSIS — I2510 Atherosclerotic heart disease of native coronary artery without angina pectoris: Secondary | ICD-10-CM

## 2017-09-26 ENCOUNTER — Encounter: Payer: Self-pay | Admitting: Thoracic Surgery (Cardiothoracic Vascular Surgery)

## 2017-09-26 ENCOUNTER — Other Ambulatory Visit: Payer: Self-pay

## 2017-09-26 ENCOUNTER — Institutional Professional Consult (permissible substitution): Payer: Medicare Other | Admitting: Thoracic Surgery (Cardiothoracic Vascular Surgery)

## 2017-09-26 VITALS — BP 168/83 | HR 87 | Ht 69.0 in | Wt 192.0 lb

## 2017-09-26 DIAGNOSIS — Z720 Tobacco use: Secondary | ICD-10-CM | POA: Insufficient documentation

## 2017-09-26 DIAGNOSIS — I25119 Atherosclerotic heart disease of native coronary artery with unspecified angina pectoris: Secondary | ICD-10-CM

## 2017-09-26 DIAGNOSIS — I208 Other forms of angina pectoris: Secondary | ICD-10-CM

## 2017-09-26 DIAGNOSIS — I2510 Atherosclerotic heart disease of native coronary artery without angina pectoris: Secondary | ICD-10-CM

## 2017-09-26 NOTE — Progress Notes (Signed)
[address]       [place] [id number]             [place] REPORT  Referring Provider is Corey Skains, MD PCP is Carmon Ginsberg, [place]  Chief Complaint  Patient presents with  . 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  The patient describes recent onset burning substernal chest discomfort that occurs only when he is walking or doing something more strenuous.  He underwent a stress echo that was reportedly abnormal and subsequently underwent diagnostic cardiac catheterization by Dr. Nehemiah Massed on September 06, 2017.  He was found to have left main and three-vessel coronary artery disease with preserved left ventricular systolic function.  Elective cardiothoracic surgical consultation was requested.  The patient is married and lives locally in [place] with his wife.  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  He initially quit working because of problems with his lower back.  He underwent back surgery last June and has recovered reasonably well.  He remains fully ambulatory.  He still has some pain in his lower back.  He reports that he is limited by pain in both calf muscles with ambulation consistent with likely claudication.  Symptoms are always relieved by rest.  Over the last few weeks he has episodes of burning substernal chest pain associated with shortness of breath with it only occur when he is walking or doing something strenuous.  Symptoms are usually promptly relieved  by rest.  He denies any nocturnal angina.  He reports no significant exertional shortness of breath, PND, orthopnea, or lower extremity edema.  Past Medical History:  Diagnosis Date  . Allergic rhinitis   . DDD (degenerative disc disease), cervical   . Erectile dysfunction   . GERD (gastroesophageal reflux disease)   . Gouty arthritis   . Headache   . Hypercalcemia   . Hyperlipemia   . Hypertension   . Palpitations   . Spinal stenosis of cervical region   . Vitamin D deficiency     Past Surgical History:  Procedure Laterality Date  . COLONOSCOPY    . LEFT HEART CATH AND CORONARY ANGIOGRAPHY Left 09/06/2017   Procedure: LEFT HEART CATH AND CORONARY ANGIOGRAPHY;  Surgeon: Corey Skains, MD;  Location: [facility];  Service: Cardiovascular;  Laterality: Left;  . percutaneous transluminal balloon angioplasty  01/2010   of left lower extremity  . VASCULAR SURGERY      Family History  Problem Relation Age of Onset  . Emphysema Father     Social History   Socioeconomic History  . Marital status: Married    Spouse name: Not on file  . Number of children: Not on file  . Years of education: Not on file  . Highest education level: Not on file  Social Needs  . Financial resource strain: Not on file  . Food insecurity - worry: Not on file  . Food insecurity - inability: Not on file  . Transportation needs - medical: Not on file  .  Transportation needs - non-medical: Not on file  Occupational History  . Not on file  Tobacco Use  . Smoking status: Current Every Day Smoker    Packs/day: 1.00    Years: 50.00    Pack years: 50.00    Types: Cigarettes  . Smokeless tobacco: Former Network engineer and Sexual Activity  . Alcohol use: Yes    Alcohol/week: 7.2 oz    Types: 12 Standard drinks or equivalent per week    Comment: 13 beers throughout the weekend  . Drug use: Yes    Types: Marijuana    Comment: 03/05/17, states he doesn't use it often  . Sexual activity:  Not on file  Other Topics Concern  . Not on file  Social History Narrative  . Not on file    Current Outpatient Medications  Medication Sig Dispense Refill  . acetaminophen (TYLENOL) 650 MG CR tablet Take 650 mg every 8 (eight) hours as needed by mouth for pain.    [name] Kitchen aspirin 81 MG tablet Take 81 mg by mouth daily.     . cloNIDine (CATAPRES) 0.1 MG tablet Take 1 tablet (0.1 mg total) by mouth 2 (two) times daily. (Patient taking differently: Take 0.1 mg 3 (three) times daily by mouth. ) 180 tablet 3  . cyclobenzaprine (FLEXERIL) 10 MG tablet TAKE 10 MG BY ORAL ROUTE 3 TIMES EVERY DAY AS NEEDED FOR MUSCLE SPASMS  1  . diltiazem (CARDIZEM CD) 300 MG 24 hr capsule Take 1 capsule (300 mg total) by mouth daily. 90 capsule 3  . docusate sodium (COLACE) 100 MG capsule Take 100 mg daily by mouth.    . gabapentin (NEURONTIN) 300 MG capsule TAKE 2 CAPSULES BY MOUTH 3 TIMES A DAY 180 capsule 5  . hydrochlorothiazide (HYDRODIURIL) 25 MG tablet Take 25 mg daily by mouth.  3  . losartan (COZAAR) 100 MG tablet Take 1 tablet (100 mg total) by mouth daily. 90 tablet 3  . OMEGA-3 FATTY ACIDS PO Take 1 capsule daily by mouth.     [name] Kitchen omeprazole (PRILOSEC) 40 MG capsule Take 1 capsule (40 mg total) by mouth daily. 90 capsule 3  . pravastatin (PRAVACHOL) 40 MG tablet Take 1 tablet (40 mg total) by mouth daily. 90 tablet 3  . traMADol (ULTRAM) 50 MG tablet Take 25 mg every 6 (six) hours as needed by mouth for moderate pain.      No current facility-administered medications for this visit.     Allergies  Allergen Reactions  . Lipitor [Atorvastatin] Other (See Comments)    MYALGIAS  . Lisinopril Cough  . Zetia [Ezetimibe] Other (See Comments)    MYALGIAS  . Protonix [Pantoprazole] Other (See Comments)    headache      Review of Systems:   General:  normal appetite, normal energy, no weight gain, no weight loss, no fever  Cardiac:  + chest pain with exertion, no chest pain at rest, no SOB with  exertion, no resting SOB, no PND, no orthopnea, no palpitations, no arrhythmia, no atrial fibrillation, no LE edema, no dizzy spells, no syncope  Respiratory:  no shortness of breath, no home oxygen, no productive cough, no dry cough, no bronchitis, no wheezing, no hemoptysis, no asthma, no pain with inspiration or cough, no sleep apnea, no CPAP at night  GI:   no difficulty swallowing, no reflux, no frequent heartburn, no hiatal hernia, no abdominal pain, occasional constipation, no diarrhea, no hematochezia, no hematemesis, no melena  GU:   no  dysuria,  no frequency, no urinary tract infection, no hematuria, no enlarged prostate, no kidney stones, no kidney disease  Vascular:  + pain suggestive of claudication, no pain in feet, no leg cramps, no varicose veins, no DVT, no non-healing foot ulcer  Neuro:   no stroke, no TIA's, no seizures, no headaches, no temporary blindness one eye,  no slurred speech, no peripheral neuropathy, + chronic pain in lower back, no instability of gait, + mild memory/cognitive dysfunction  Musculoskeletal: + arthritis, no joint swelling, no myalgias, no difficulty walking, normal mobility   Skin:   no rash, no itching, no skin infections, no pressure sores or ulcerations  Psych:   no anxiety, no depression, no nervousness, no unusual recent stress  Eyes:   no blurry vision, no floaters, no recent vision changes, + wears glasses or contacts  ENT:   no hearing loss, no loose or painful teeth, no dentures, last saw dentist many years ago  Hematologic:  no easy bruising, no abnormal bleeding, no clotting disorder, no frequent epistaxis  Endocrine:  no diabetes, does not check CBG's at home     Physical Exam:   BP (!) 168/83   Pulse 87   Ht 5\' 9"  (1.753 m)   Wt 192 lb (87.1 kg)   SpO2 98%   BMI 28.35 kg/m   General:    well-appearing  HEENT:  Unremarkable   Neck:   no JVD, no bruits, no adenopathy   Chest:   clear to auscultation, symmetrical breath sounds, no  wheezes, no rhonchi   CV:   RRR, no  murmur   Abdomen:  soft, non-tender, no masses   Extremities:  warm, well-perfused, pulses diminished but palpable, no LE edema  Rectal/GU  Deferred  Neuro:   Grossly non-focal and symmetrical throughout  Skin:   Clean and dry, no rashes, no breakdown   Diagnostic Tests:  LEFT HEART CATH AND CORONARY ANGIOGRAPHY  Conclusion     Ost LM to Mid LM lesion is 75% stenosed.  Ost Cx to Prox Cx lesion is 85% stenosed.  Ost 1st Mrg lesion is 75% stenosed.  Ost RCA to Prox RCA lesion is 85% stenosed.  Prox LAD-1 lesion is 55% stenosed.  Prox LAD-2 lesion is 70% stenosed.  Mid Cx lesion is 70% stenosed.   Assessment The patient has had progressive canadian class 4 anginal symptoms with  risk factors including high blood pressure, high cholesterol and smoking.  normal left ventricular function with ejection fraction of 60%  severe 3 vessel coronary artery disease   There is significant stenosis of left main lad, rca ,lcx  Plan Continue medical management of CAD risk factors and Consider consultation for CABG   Indications   Chest pain, unspecified type [R07.9 (ICD-10-CM)]  Procedural Details/Technique   Technical Details Procedural details: The right groin was prepped, draped, and anesthetized with 1% lidocaine. Using modified Seldinger technique, a 5 French sheath was introduced into the right femoral artery. Standard Judkins catheters were used for coronary angiography and left ventriculography. Catheter exchanges were performed over a guidewire. There were no immediate procedural complications. The patient was transferred to the post catheterization recovery area for further monitoring.  During this procedure the patient is administered a total of Versed 1 mg and Fentanyl 25 mg to achieve and maintain moderate conscious sedation. The patient's heart rate, blood pressure, and oxygen saturation are monitored continuously during the  procedure. The period of conscious sedation is 19 minutes, of which I was present  face-to-face 100% of this time.    Estimated blood loss <50 mL.  During this procedure the patient was administered the following to achieve and maintain moderate conscious sedation: Versed mg, while the patient's heart rate, blood pressure, and oxygen saturation were continuously monitored.  Coronary Findings   Diagnostic  Dominance: Right  Left Main  Ost LM to Mid LM lesion 75% stenosed  Ost LM to Mid LM lesion is 75% stenosed.  Left Anterior Descending  Prox LAD-1 lesion 55% stenosed  Prox LAD-1 lesion is 55% stenosed.  Prox LAD-2 lesion 70% stenosed  Prox LAD-2 lesion is 70% stenosed.  Left Circumflex  Ost Cx to Prox Cx lesion 85% stenosed  Ost Cx to Prox Cx lesion is 85% stenosed.  Mid Cx lesion 70% stenosed  Mid Cx lesion is 70% stenosed.  First Obtuse Marginal Branch  Ost 1st Mrg lesion 75% stenosed  Ost 1st Mrg lesion is 75% stenosed.  Right Coronary Artery  Ost RCA to Prox RCA lesion 85% stenosed  Ost RCA to Prox RCA lesion is 85% stenosed.  Intervention   No interventions have been documented.  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  He presents with symptoms consistent with stable angina pectoris.  I have personally reviewed the patient's recent diagnostic cardiac catheterization.  I agree the patient would best be treated with surgical revascularization.   Plan:  I have reviewed the indications, risks, and potential benefits of coronary artery bypass grafting with the patient in the office today.  Alternative treatment strategies have been discussed, including the relative risks, benefits and long term prognosis associated with medical therapy, percutaneous coronary intervention, and surgical  revascularization.  The patient understands and accepts all potential associated risks of surgery including but not limited to risk of death, stroke or other neurologic complication, myocardial infarction, congestive heart failure, respiratory failure, renal failure, bleeding requiring blood transfusion and/or reexploration, aortic dissection or other major vascular complication, arrhythmia, heart block or bradycardia requiring permanent pacemaker, pneumonia, pleural effusion, wound infection, pulmonary embolus or other thromboembolic complication, chronic pain or other delayed complications related to median sternotomy, or the late recurrence of symptomatic ischemic heart disease and/or congestive heart failure.  The importance of long term risk modification have been emphasized.  We plan to proceed with surgery later this week on Friday, September 30, 2017.  The patient has been advised to avoid any kind of strenuous physical activity between now and the time of surgery.  All questions answered.     I spent in excess of 90 minutes during the conduct of this office consultation and >50% of this time involved direct face-to-face encounter with the patient for counseling and/or coordination of their care.    Valentina Gu. Roxy Manns, MD 09/26/2017 10:21 AM

## 2017-09-26 NOTE — Patient Instructions (Signed)
Stop smoking immediately and permanently  No strenuous exertion between now and when you have surgery  Go to ER or call 911 if you get burning chest discomfort that doesn't resolve with rest  Stop taking fish oil capsules  Continue taking all other medications without change through the day before surgery.  Have nothing to eat or drink after midnight the night before surgery.  On the morning of surgery take only clonidine with a sip of water.

## 2017-09-26 NOTE — H&P (View-Only) (Signed)
[address]       [place] [address]             [facility] REPORT  Referring Provider is Corey Skains, MD PCP is Carmon Ginsberg, [place]  Chief Complaint  Patient presents with  . 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  The patient describes recent onset burning substernal chest discomfort that occurs only when he is walking or doing something more strenuous.  He underwent a stress echo that was reportedly abnormal and subsequently underwent diagnostic cardiac catheterization by Dr. Nehemiah Massed on September 06, 2017.  He was found to have left main and three-vessel coronary artery disease with preserved left ventricular systolic function.  Elective cardiothoracic surgical consultation was requested.  The patient is married and lives locally in [place] with his wife.  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  He initially quit working because of problems with his lower back.  He underwent back surgery last June and has recovered reasonably well.  He remains fully ambulatory.  He still has some pain in his lower back.  He reports that he is limited by pain in both calf muscles with ambulation consistent with likely claudication.  Symptoms are always relieved by rest.  Over the last few weeks he has episodes of burning substernal chest pain associated with shortness of breath with it only occur when he is walking or doing something strenuous.  Symptoms are usually promptly relieved  by rest.  He denies any nocturnal angina.  He reports no significant exertional shortness of breath, PND, orthopnea, or lower extremity edema.  Past Medical History:  Diagnosis Date  . Allergic rhinitis   . DDD (degenerative disc disease), cervical   . Erectile dysfunction   . GERD (gastroesophageal reflux disease)   . Gouty arthritis   . Headache   . Hypercalcemia   . Hyperlipemia   . Hypertension   . Palpitations   . Spinal stenosis of cervical region   . Vitamin D deficiency     Past Surgical History:  Procedure Laterality Date  . COLONOSCOPY    . LEFT HEART CATH AND CORONARY ANGIOGRAPHY Left 09/06/2017   Procedure: LEFT HEART CATH AND CORONARY ANGIOGRAPHY;  Surgeon: Corey Skains, MD;  Location: [facility];  Service: Cardiovascular;  Laterality: Left;  . percutaneous transluminal balloon angioplasty  01/2010   of left lower extremity  . VASCULAR SURGERY      Family History  Problem Relation Age of Onset  . Emphysema Father     Social History   Socioeconomic History  . Marital status: Married    Spouse name: Not on file  . Number of children: Not on file  . Years of education: Not on file  . Highest education level: Not on file  Social Needs  . Financial resource strain: Not on file  . Food insecurity - worry: Not on file  . Food insecurity - inability: Not on file  . Transportation needs - medical: Not on file  .  Transportation needs - non-medical: Not on file  Occupational History  . Not on file  Tobacco Use  . Smoking status: Current Every Day Smoker    Packs/day: 1.00    Years: 50.00    Pack years: 50.00    Types: Cigarettes  . Smokeless tobacco: Former Network engineer and Sexual Activity  . Alcohol use: Yes    Alcohol/week: 7.2 oz    Types: 12 Standard drinks or equivalent per week    Comment: 13 beers throughout the weekend  . Drug use: Yes    Types: Marijuana    Comment: 03/05/17, states he doesn't use it often  . Sexual activity:  Not on file  Other Topics Concern  . Not on file  Social History Narrative  . Not on file    Current Outpatient Medications  Medication Sig Dispense Refill  . acetaminophen (TYLENOL) 650 MG CR tablet Take 650 mg every 8 (eight) hours as needed by mouth for pain.    [name] Kitchen aspirin 81 MG tablet Take 81 mg by mouth daily.     . cloNIDine (CATAPRES) 0.1 MG tablet Take 1 tablet (0.1 mg total) by mouth 2 (two) times daily. (Patient taking differently: Take 0.1 mg 3 (three) times daily by mouth. ) 180 tablet 3  . cyclobenzaprine (FLEXERIL) 10 MG tablet TAKE 10 MG BY ORAL ROUTE 3 TIMES EVERY DAY AS NEEDED FOR MUSCLE SPASMS  1  . diltiazem (CARDIZEM CD) 300 MG 24 hr capsule Take 1 capsule (300 mg total) by mouth daily. 90 capsule 3  . docusate sodium (COLACE) 100 MG capsule Take 100 mg daily by mouth.    . gabapentin (NEURONTIN) 300 MG capsule TAKE 2 CAPSULES BY MOUTH 3 TIMES A DAY 180 capsule 5  . hydrochlorothiazide (HYDRODIURIL) 25 MG tablet Take 25 mg daily by mouth.  3  . losartan (COZAAR) 100 MG tablet Take 1 tablet (100 mg total) by mouth daily. 90 tablet 3  . OMEGA-3 FATTY ACIDS PO Take 1 capsule daily by mouth.     [name] Kitchen omeprazole (PRILOSEC) 40 MG capsule Take 1 capsule (40 mg total) by mouth daily. 90 capsule 3  . pravastatin (PRAVACHOL) 40 MG tablet Take 1 tablet (40 mg total) by mouth daily. 90 tablet 3  . traMADol (ULTRAM) 50 MG tablet Take 25 mg every 6 (six) hours as needed by mouth for moderate pain.      No current facility-administered medications for this visit.     Allergies  Allergen Reactions  . Lipitor [Atorvastatin] Other (See Comments)    MYALGIAS  . Lisinopril Cough  . Zetia [Ezetimibe] Other (See Comments)    MYALGIAS  . Protonix [Pantoprazole] Other (See Comments)    headache      Review of Systems:   General:  normal appetite, normal energy, no weight gain, no weight loss, no fever  Cardiac:  + chest pain with exertion, no chest pain at rest, no SOB with  exertion, no resting SOB, no PND, no orthopnea, no palpitations, no arrhythmia, no atrial fibrillation, no LE edema, no dizzy spells, no syncope  Respiratory:  no shortness of breath, no home oxygen, no productive cough, no dry cough, no bronchitis, no wheezing, no hemoptysis, no asthma, no pain with inspiration or cough, no sleep apnea, no CPAP at night  GI:   no difficulty swallowing, no reflux, no frequent heartburn, no hiatal hernia, no abdominal pain, occasional constipation, no diarrhea, no hematochezia, no hematemesis, no melena  GU:   no  dysuria,  no frequency, no urinary tract infection, no hematuria, no enlarged prostate, no kidney stones, no kidney disease  Vascular:  + pain suggestive of claudication, no pain in feet, no leg cramps, no varicose veins, no DVT, no non-healing foot ulcer  Neuro:   no stroke, no TIA's, no seizures, no headaches, no temporary blindness one eye,  no slurred speech, no peripheral neuropathy, + chronic pain in lower back, no instability of gait, + mild memory/cognitive dysfunction  Musculoskeletal: + arthritis, no joint swelling, no myalgias, no difficulty walking, normal mobility   Skin:   no rash, no itching, no skin infections, no pressure sores or ulcerations  Psych:   no anxiety, no depression, no nervousness, no unusual recent stress  Eyes:   no blurry vision, no floaters, no recent vision changes, + wears glasses or contacts  ENT:   no hearing loss, no loose or painful teeth, no dentures, last saw dentist many years ago  Hematologic:  no easy bruising, no abnormal bleeding, no clotting disorder, no frequent epistaxis  Endocrine:  no diabetes, does not check CBG's at home     Physical Exam:   BP (!) 168/83   Pulse 87   Ht 5\' 9"  (1.753 m)   Wt 192 lb (87.1 kg)   SpO2 98%   BMI 28.35 kg/m   General:    well-appearing  HEENT:  Unremarkable   Neck:   no JVD, no bruits, no adenopathy   Chest:   clear to auscultation, symmetrical breath sounds, no  wheezes, no rhonchi   CV:   RRR, no  murmur   Abdomen:  soft, non-tender, no masses   Extremities:  warm, well-perfused, pulses diminished but palpable, no LE edema  Rectal/GU  Deferred  Neuro:   Grossly non-focal and symmetrical throughout  Skin:   Clean and dry, no rashes, no breakdown   Diagnostic Tests:  LEFT HEART CATH AND CORONARY ANGIOGRAPHY  Conclusion     Ost LM to Mid LM lesion is 75% stenosed.  Ost Cx to Prox Cx lesion is 85% stenosed.  Ost 1st Mrg lesion is 75% stenosed.  Ost RCA to Prox RCA lesion is 85% stenosed.  Prox LAD-1 lesion is 55% stenosed.  Prox LAD-2 lesion is 70% stenosed.  Mid Cx lesion is 70% stenosed.   Assessment The patient has had progressive canadian class 4 anginal symptoms with  risk factors including high blood pressure, high cholesterol and smoking.  normal left ventricular function with ejection fraction of 60%  severe 3 vessel coronary artery disease   There is significant stenosis of left main lad, rca ,lcx  Plan Continue medical management of CAD risk factors and Consider consultation for CABG   Indications   Chest pain, unspecified type [R07.9 (ICD-10-CM)]  Procedural Details/Technique   Technical Details Procedural details: The right groin was prepped, draped, and anesthetized with 1% lidocaine. Using modified Seldinger technique, a 5 French sheath was introduced into the right femoral artery. Standard Judkins catheters were used for coronary angiography and left ventriculography. Catheter exchanges were performed over a guidewire. There were no immediate procedural complications. The patient was transferred to the post catheterization recovery area for further monitoring.  During this procedure the patient is administered a total of Versed 1 mg and Fentanyl 25 mg to achieve and maintain moderate conscious sedation. The patient's heart rate, blood pressure, and oxygen saturation are monitored continuously during the  procedure. The period of conscious sedation is 19 minutes, of which I was present  face-to-face 100% of this time.    Estimated blood loss <50 mL.  During this procedure the patient was administered the following to achieve and maintain moderate conscious sedation: Versed mg, while the patient's heart rate, blood pressure, and oxygen saturation were continuously monitored.  Coronary Findings   Diagnostic  Dominance: Right  Left Main  Ost LM to Mid LM lesion 75% stenosed  Ost LM to Mid LM lesion is 75% stenosed.  Left Anterior Descending  Prox LAD-1 lesion 55% stenosed  Prox LAD-1 lesion is 55% stenosed.  Prox LAD-2 lesion 70% stenosed  Prox LAD-2 lesion is 70% stenosed.  Left Circumflex  Ost Cx to Prox Cx lesion 85% stenosed  Ost Cx to Prox Cx lesion is 85% stenosed.  Mid Cx lesion 70% stenosed  Mid Cx lesion is 70% stenosed.  First Obtuse Marginal Branch  Ost 1st Mrg lesion 75% stenosed  Ost 1st Mrg lesion is 75% stenosed.  Right Coronary Artery  Ost RCA to Prox RCA lesion 85% stenosed  Ost RCA to Prox RCA lesion is 85% stenosed.  Intervention   No interventions have been documented.  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  He presents with symptoms consistent with stable angina pectoris.  I have personally reviewed the patient's recent diagnostic cardiac catheterization.  I agree the patient would best be treated with surgical revascularization.   Plan:  I have reviewed the indications, risks, and potential benefits of coronary artery bypass grafting with the patient in the office today.  Alternative treatment strategies have been discussed, including the relative risks, benefits and long term prognosis associated with medical therapy, percutaneous coronary intervention, and surgical  revascularization.  The patient understands and accepts all potential associated risks of surgery including but not limited to risk of death, stroke or other neurologic complication, myocardial infarction, congestive heart failure, respiratory failure, renal failure, bleeding requiring blood transfusion and/or reexploration, aortic dissection or other major vascular complication, arrhythmia, heart block or bradycardia requiring permanent pacemaker, pneumonia, pleural effusion, wound infection, pulmonary embolus or other thromboembolic complication, chronic pain or other delayed complications related to median sternotomy, or the late recurrence of symptomatic ischemic heart disease and/or congestive heart failure.  The importance of long term risk modification have been emphasized.  We plan to proceed with surgery later this week on Friday, September 30, 2017.  The patient has been advised to avoid any kind of strenuous physical activity between now and the time of surgery.  All questions answered.     I spent in excess of 90 minutes during the conduct of this office consultation and >50% of this time involved direct face-to-face encounter with the patient for counseling and/or coordination of their care.    Valentina Gu. Roxy Manns, MD 09/26/2017 10:21 AM

## 2017-09-27 ENCOUNTER — Encounter (HOSPITAL_COMMUNITY): Payer: Self-pay

## 2017-09-27 ENCOUNTER — Other Ambulatory Visit: Payer: Self-pay

## 2017-09-27 ENCOUNTER — Ambulatory Visit (HOSPITAL_COMMUNITY)
Admission: RE | Admit: 2017-09-27 | Discharge: 2017-09-27 | Disposition: A | Discharge status: Home / Self Care | Payer: Medicare Other | Source: Ambulatory Visit | Attending: Thoracic Surgery (Cardiothoracic Vascular Surgery) | Admitting: Thoracic Surgery (Cardiothoracic Vascular Surgery)

## 2017-09-27 ENCOUNTER — Ambulatory Visit (HOSPITAL_BASED_OUTPATIENT_CLINIC_OR_DEPARTMENT_OTHER)
Admission: RE | Admit: 2017-09-27 | Discharge: 2017-09-27 | Disposition: A | Discharge status: Home / Self Care | Payer: Medicare Other | Source: Ambulatory Visit | Attending: Thoracic Surgery (Cardiothoracic Vascular Surgery) | Admitting: Thoracic Surgery (Cardiothoracic Vascular Surgery)

## 2017-09-27 ENCOUNTER — Encounter (HOSPITAL_COMMUNITY)
Admission: RE | Admit: 2017-09-27 | Discharge: 2017-09-27 | Disposition: A | Discharge status: Home / Self Care | Payer: Medicare Other | Source: Ambulatory Visit | Attending: Thoracic Surgery (Cardiothoracic Vascular Surgery) | Admitting: Thoracic Surgery (Cardiothoracic Vascular Surgery)

## 2017-09-27 DIAGNOSIS — I6523 Occlusion and stenosis of bilateral carotid arteries: Secondary | ICD-10-CM | POA: Insufficient documentation

## 2017-09-27 DIAGNOSIS — Z79899 Other long term (current) drug therapy: Secondary | ICD-10-CM

## 2017-09-27 DIAGNOSIS — I2510 Atherosclerotic heart disease of native coronary artery without angina pectoris: Secondary | ICD-10-CM

## 2017-09-27 DIAGNOSIS — I340 Nonrheumatic mitral (valve) insufficiency: Secondary | ICD-10-CM | POA: Insufficient documentation

## 2017-09-27 DIAGNOSIS — Z01818 Encounter for other preprocedural examination: Secondary | ICD-10-CM

## 2017-09-27 DIAGNOSIS — Z0183 Encounter for blood typing: Secondary | ICD-10-CM

## 2017-09-27 DIAGNOSIS — R9431 Abnormal electrocardiogram [ECG] [EKG]: Secondary | ICD-10-CM | POA: Insufficient documentation

## 2017-09-27 DIAGNOSIS — I10 Essential (primary) hypertension: Secondary | ICD-10-CM | POA: Diagnosis not present

## 2017-09-27 DIAGNOSIS — K219 Gastro-esophageal reflux disease without esophagitis: Secondary | ICD-10-CM | POA: Diagnosis not present

## 2017-09-27 DIAGNOSIS — D62 Acute posthemorrhagic anemia: Secondary | ICD-10-CM | POA: Diagnosis not present

## 2017-09-27 DIAGNOSIS — Z01812 Encounter for preprocedural laboratory examination: Secondary | ICD-10-CM

## 2017-09-27 DIAGNOSIS — D696 Thrombocytopenia, unspecified: Secondary | ICD-10-CM | POA: Diagnosis not present

## 2017-09-27 DIAGNOSIS — I739 Peripheral vascular disease, unspecified: Secondary | ICD-10-CM | POA: Diagnosis not present

## 2017-09-27 DIAGNOSIS — I517 Cardiomegaly: Secondary | ICD-10-CM

## 2017-09-27 DIAGNOSIS — I25110 Atherosclerotic heart disease of native coronary artery with unstable angina pectoris: Secondary | ICD-10-CM | POA: Diagnosis not present

## 2017-09-27 DIAGNOSIS — E785 Hyperlipidemia, unspecified: Secondary | ICD-10-CM | POA: Diagnosis not present

## 2017-09-27 DIAGNOSIS — Z7982 Long term (current) use of aspirin: Secondary | ICD-10-CM | POA: Diagnosis not present

## 2017-09-27 HISTORY — DX: Atherosclerotic heart disease of native coronary artery without angina pectoris: I25.10

## 2017-09-27 HISTORY — DX: Unspecified cataract: H26.9

## 2017-09-27 HISTORY — DX: Peripheral vascular disease, unspecified: I73.9

## 2017-09-27 LAB — COMPREHENSIVE METABOLIC PANEL
ALT: 15 U/L — ABNORMAL LOW (ref 17–63)
AST: 19 U/L (ref 15–41)
Albumin: 3.9 g/dL (ref 3.5–5.0)
Alkaline Phosphatase: 60 U/L (ref 38–126)
Anion gap: 8 (ref 5–15)
BUN: 10 mg/dL (ref 6–20)
CO2: 21 mmol/L — ABNORMAL LOW (ref 22–32)
Calcium: 9.5 mg/dL (ref 8.9–10.3)
Chloride: 109 mmol/L (ref 101–111)
Creatinine, Ser: 0.87 mg/dL (ref 0.61–1.24)
GFR calc Af Amer: >60 mL/min (ref >60)
GFR calc non Af Amer: >60 mL/min (ref >60)
Glucose, Bld: 106 mg/dL — ABNORMAL HIGH (ref 65–99)
Potassium: 4.1 mmol/L (ref 3.5–5.1)
Sodium: 138 mmol/L (ref 135–145)
Total Bilirubin: 0.4 mg/dL (ref 0.3–1.2)
Total Protein: 7.2 g/dL (ref 6.5–8.1)

## 2017-09-27 LAB — PULMONARY FUNCTION TEST
DL/VA % pred: 77 %
DL/VA: 3.53 ml/min/mmHg/L
DLCO unc % pred: 53 %
DLCO unc: 16.53 ml/min/mmHg
FEF 25-75 Post: 4.57 L/sec
FEF 25-75 Pre: 3.19 L/sec
FEF2575-%Change-Post: 43 %
FEF2575-%Pred-Post: 192 %
FEF2575-%Pred-Pre: 134 %
FEV1-%Change-Post: 11 %
FEV1-%Pred-Post: 93 %
FEV1-%Pred-Pre: 84 %
FEV1-Post: 2.58 L
FEV1-Pre: 2.32 L
FEV1FVC-%Change-Post: 2 %
FEV1FVC-%Pred-Pre: 110 %
FEV6-%Change-Post: 8 %
FEV6-%Pred-Post: 85 %
FEV6-%Pred-Pre: 78 %
FEV6-Post: 2.99 L
FEV6-Pre: 2.76 L
FEV6FVC-%Pred-Post: 105 %
FEV6FVC-%Pred-Pre: 105 %
FVC-%Change-Post: 8 %
FVC-%Pred-Post: 81 %
FVC-%Pred-Pre: 75 %
FVC-Post: 2.99 L
FVC-Pre: 2.76 L
Post FEV1/FVC ratio: 86 %
Post FEV6/FVC ratio: 100 %
Pre FEV1/FVC ratio: 84 %
Pre FEV6/FVC Ratio: 100 %
RV % pred: 62 %
RV: 1.52 L
TLC % pred: 62 %
TLC: 4.26 L

## 2017-09-27 LAB — URINALYSIS, ROUTINE W REFLEX MICROSCOPIC
Bilirubin Urine: NEGATIVE
Glucose, UA: NEGATIVE mg/dL
Hgb urine dipstick: NEGATIVE
Ketones, ur: NEGATIVE mg/dL
Leukocytes, UA: NEGATIVE
Nitrite: NEGATIVE
Protein, ur: NEGATIVE mg/dL
Specific Gravity, Urine: 1.006 (ref 1.005–1.030)
pH: 7 (ref 5.0–8.0)

## 2017-09-27 LAB — CBC
HCT: 33.6 % — ABNORMAL LOW (ref 39.0–52.0)
Hemoglobin: 10.2 g/dL — ABNORMAL LOW (ref 13.0–17.0)
MCH: 25.6 pg — ABNORMAL LOW (ref 26.0–34.0)
MCHC: 30.4 g/dL (ref 30.0–36.0)
MCV: 84.4 fL (ref 78.0–100.0)
Platelets: 294 10*3/uL (ref 150–400)
RBC: 3.98 MIL/uL — ABNORMAL LOW (ref 4.22–5.81)
RDW: 17.9 % — ABNORMAL HIGH (ref 11.5–15.5)
WBC: 5.1 10*3/uL (ref 4.0–10.5)

## 2017-09-27 LAB — SURGICAL PCR SCREEN
MRSA, PCR: NEGATIVE
Staphylococcus aureus: POSITIVE — AB

## 2017-09-27 LAB — PROTIME-INR
INR: 0.95
Prothrombin Time: 12.6 seconds (ref 11.4–15.2)

## 2017-09-27 LAB — BLOOD GAS, ARTERIAL
Acid-base deficit: 0.1 mmol/L (ref 0.0–2.0)
Bicarbonate: 23.9 mmol/L (ref 20.0–28.0)
Drawn by: 449841
FIO2: 21
O2 Saturation: 97.8 %
Patient temperature: 98.6
pCO2 arterial: 37.5 mmHg (ref 32.0–48.0)
pH, Arterial: 7.42 (ref 7.350–7.450)
pO2, Arterial: 101 mmHg (ref 83.0–108.0)

## 2017-09-27 LAB — ECHOCARDIOGRAM COMPLETE
Height: 68.5 in
Weight: 3017.6 oz

## 2017-09-27 LAB — APTT: aPTT: 27 seconds (ref 24–36)

## 2017-09-27 MED ORDER — ALBUTEROL SULFATE (2.5 MG/3ML) 0.083% IN NEBU
2.5000 mg | INHALATION_SOLUTION | Freq: Once | RESPIRATORY_TRACT | Status: AC
  Administered 2017-09-27: 2.5 mg via RESPIRATORY_TRACT

## 2017-09-27 NOTE — Progress Notes (Signed)
Pt denies any acute cardiopulmonary issues. Pt under the care of Dr. Nehemiah Massed, Cardiology. Pt chart forwarded to anesthesia for review.

## 2017-09-27 NOTE — Progress Notes (Signed)
  Echocardiogram 2D Echocardiogram has been performed.  Craig [name] 09/27/2017, 2:37 PM

## 2017-09-27 NOTE — Pre-Procedure Instructions (Signed)
Craig [name]  09/27/2017      [id number] - [place], [place] - [address] [place] [address] Phone: [phone] Fax: [place], [place] [address] [place] [address] Phone: [phone] Fax: [phone]    Your procedure is scheduled on Friday, September 30, 2017  Report to [facility] at 5:30 A.M.  Call this number if you have problems the morning of surgery:  [phone]   Remember:  Do not eat food or drink liquids after midnight Thursday, December 20  Take these medicines the morning of surgery with A SIP OF WATER: cloNIDine (CATAPRES),  diltiazem (CARDIZEM CD), gabapentin (NEURONTIN), omeprazole (PRILOSEC), if needed: pain medication Stop taking vitamins, OMEGA-3 FATTY ACIDS (fish oil),  and herbal medications. Do not take any NSAIDs ie: Ibuprofen, Advil, Naproxen (Aleve), Motrin, BC and Goody Powder; stop now.  Do not wear jewelry, make-up or nail polish.  Do not wear lotions, powders, or perfumes, or deodorant.  Do not shave 48 hours prior to surgery.  Men may shave face and neck.  Do not bring valuables to the hospital.  [facility] is not responsible for any belongings or valuables.  Contacts, dentures or bridgework may not be worn into surgery.  Leave your suitcase in the car.  After surgery it may be brought to your room. For patients admitted to the hospital, discharge time will be determined by your treatment team. Special instructions:   [facility] - Preparing for Surgery  Before surgery, you can play an important role.  Because skin is not sterile, your skin needs to be as free of germs as possible.  You can reduce the number of germs on you skin by washing with CHG (chlorahexidine gluconate) soap before surgery.  CHG is an antiseptic cleaner which kills germs and bonds with the skin to continue killing germs even after  washing.  Please DO NOT use if you have an allergy to CHG or antibacterial soaps.  If your skin becomes reddened/irritated stop using the CHG and inform your nurse when you arrive at Short Stay.  Do not shave (including legs and underarms) for at least 48 hours prior to the first CHG shower.  You may shave your face.  Please follow these instructions carefully:   1.  Shower with CHG Soap the night before surgery and the morning of Surgery.  2.  If you choose to wash your hair, wash your hair first as usual with your normal shampoo.  3.  After you shampoo, rinse your hair and body thoroughly to remove the Shampoo.  4.  Use CHG as you would any other liquid soap.  You can apply chg directly  to the skin and wash gently with scrungie or a clean washcloth.  5.  Apply the CHG Soap to your body ONLY FROM THE NECK DOWN.  Do not use on open wounds or open sores.  Avoid contact with your eyes, ears, mouth and genitals (private parts).  Wash genitals (private parts) with your normal soap.  6.  Wash thoroughly, paying special attention to the area where your surgery will be performed.  7.  Thoroughly rinse your body with warm water from the neck down.  8.  DO NOT shower/wash with your normal soap after using and rinsing off the CHG Soap.  9.  Pat yourself dry with a clean towel.  10.  Wear clean pajamas.            11.  Place clean sheets on your bed the night of your first shower and do not sleep with pets.  Day of Surgery  Do not apply any lotions/deodorants the morning of surgery.  Please wear clean clothes to the hospital/surgery center.  Please read over the following fact sheets that you were given. Pain Booklet, Coughing and Deep Breathing, Blood Transfusion Information, MRSA Information and Surgical Site Infection Prevention

## 2017-09-27 NOTE — Progress Notes (Signed)
Carotid duplex prelim: 40-59% bilat ICA stenosis. UE Doppler: Right decreases >50% with radial compression, normal with ulnar compression. Left decreases >50% with radial and ulnar compression. ABI: Right moderate disease. Left mild disease.   Landry Mellow, RDMS, RVT

## 2017-09-28 ENCOUNTER — Other Ambulatory Visit: Payer: Self-pay | Admitting: Family Medicine

## 2017-09-28 LAB — HEMOGLOBIN A1C
Hgb A1c MFr Bld: 5.6 % (ref 4.8–5.6)
Mean Plasma Glucose: 114 mg/dL

## 2017-09-29 MED ORDER — NITROGLYCERIN IN D5W 200-5 MCG/ML-% IV SOLN
2.0000 ug/min | INTRAVENOUS | Status: AC
  Administered 2017-09-30: 16.6 ug/min via INTRAVENOUS
  Filled 2017-09-29: qty 250

## 2017-09-29 MED ORDER — SODIUM CHLORIDE 0.9 % IV SOLN
INTRAVENOUS | Status: AC
  Administered 2017-09-30: 1 [IU]/h via INTRAVENOUS
  Filled 2017-09-29: qty 1

## 2017-09-29 MED ORDER — KENNESTONE BLOOD CARDIOPLEGIA (KBC) MANNITOL SYRINGE (20%, 32ML)
32.0000 mL | Freq: Once | INTRAVENOUS | Status: DC
  Filled 2017-09-29 (×3): qty 32

## 2017-09-29 MED ORDER — CEFUROXIME SODIUM 750 MG IJ SOLR
750.0000 mg | INTRAMUSCULAR | Status: DC
  Filled 2017-09-29: qty 750

## 2017-09-29 MED ORDER — DOPAMINE-DEXTROSE 3.2-5 MG/ML-% IV SOLN
0.0000 ug/kg/min | INTRAVENOUS | Status: DC
  Filled 2017-09-29: qty 250

## 2017-09-29 MED ORDER — PAPAVERINE HCL 30 MG/ML IJ SOLN
INTRAMUSCULAR | Status: AC
  Administered 2017-09-30: 500 mL
  Filled 2017-09-29: qty 2.5

## 2017-09-29 MED ORDER — DEXMEDETOMIDINE HCL IN NACL 400 MCG/100ML IV SOLN
0.1000 ug/kg/h | INTRAVENOUS | Status: AC
  Administered 2017-09-30: .2 ug/kg/h via INTRAVENOUS
  Filled 2017-09-29: qty 100

## 2017-09-29 MED ORDER — POTASSIUM CHLORIDE 2 MEQ/ML IV SOLN
80.0000 meq | INTRAVENOUS | Status: DC
  Filled 2017-09-29: qty 40

## 2017-09-29 MED ORDER — SODIUM CHLORIDE 0.9 % IV SOLN
INTRAVENOUS | Status: DC
  Filled 2017-09-29: qty 30

## 2017-09-29 MED ORDER — VANCOMYCIN HCL 10 G IV SOLR
1500.0000 mg | INTRAVENOUS | Status: DC
  Filled 2017-09-29: qty 1500

## 2017-09-29 MED ORDER — DEXTROSE 5 % IV SOLN
1.5000 g | INTRAVENOUS | Status: DC
  Filled 2017-09-29 (×2): qty 1.5

## 2017-09-29 MED ORDER — KENNESTONE BLOOD CARDIOPLEGIA VIAL
13.0000 mL | Freq: Once | Status: DC
  Filled 2017-09-29 (×3): qty 13

## 2017-09-29 MED ORDER — SODIUM CHLORIDE 0.9 % IV SOLN
30.0000 ug/min | INTRAVENOUS | Status: AC
  Administered 2017-09-30: 10 ug/min via INTRAVENOUS
  Filled 2017-09-29: qty 2

## 2017-09-29 MED ORDER — SODIUM CHLORIDE 0.9 % IV SOLN
INTRAVENOUS | Status: AC
  Administered 2017-09-30: 1000 mL
  Filled 2017-09-29: qty 1000

## 2017-09-29 MED ORDER — TRANEXAMIC ACID (OHS) PUMP PRIME SOLUTION
2.0000 mg/kg | INTRAVENOUS | Status: DC
  Filled 2017-09-29: qty 1.71

## 2017-09-29 MED ORDER — TRANEXAMIC ACID 1000 MG/10ML IV SOLN
1.5000 mg/kg/h | INTRAVENOUS | Status: DC
  Filled 2017-09-29: qty 25

## 2017-09-29 MED ORDER — EPINEPHRINE PF 1 MG/ML IJ SOLN
0.0000 ug/min | INTRAVENOUS | Status: DC
  Filled 2017-09-29: qty 4

## 2017-09-29 MED ORDER — TRANEXAMIC ACID (OHS) BOLUS VIA INFUSION
15.0000 mg/kg | INTRAVENOUS | Status: AC
  Administered 2017-09-30: 1282.5 mg via INTRAVENOUS
  Filled 2017-09-29: qty 1283

## 2017-09-29 MED ORDER — MAGNESIUM SULFATE 50 % IJ SOLN
40.0000 meq | INTRAMUSCULAR | Status: DC
  Filled 2017-09-29: qty 9.85

## 2017-09-30 ENCOUNTER — Inpatient Hospital Stay (HOSPITAL_COMMUNITY): Payer: Medicare Other | Admitting: Emergency Medicine

## 2017-09-30 ENCOUNTER — Encounter (HOSPITAL_COMMUNITY): Payer: Self-pay

## 2017-09-30 ENCOUNTER — Inpatient Hospital Stay (HOSPITAL_COMMUNITY)
Admission: RE | Admit: 2017-09-30 | Discharge: 2017-10-05 | DRG: 236 | Disposition: A | Discharge status: Home / Self Care | Payer: Medicare Other | Source: Ambulatory Visit | Attending: Thoracic Surgery (Cardiothoracic Vascular Surgery) | Admitting: Thoracic Surgery (Cardiothoracic Vascular Surgery)

## 2017-09-30 ENCOUNTER — Inpatient Hospital Stay (HOSPITAL_COMMUNITY): Payer: Medicare Other

## 2017-09-30 ENCOUNTER — Inpatient Hospital Stay (HOSPITAL_COMMUNITY): Payer: Medicare Other | Admitting: Anesthesiology

## 2017-09-30 ENCOUNTER — Encounter (HOSPITAL_COMMUNITY)
Admission: RE | Disposition: A | Discharge status: Home / Self Care | Payer: Self-pay | Source: Ambulatory Visit | Attending: Thoracic Surgery (Cardiothoracic Vascular Surgery)

## 2017-09-30 ENCOUNTER — Other Ambulatory Visit: Payer: Self-pay

## 2017-09-30 DIAGNOSIS — D62 Acute posthemorrhagic anemia: Secondary | ICD-10-CM | POA: Diagnosis not present

## 2017-09-30 DIAGNOSIS — D696 Thrombocytopenia, unspecified: Secondary | ICD-10-CM | POA: Diagnosis present

## 2017-09-30 DIAGNOSIS — I25700 Atherosclerosis of coronary artery bypass graft(s), unspecified, with unstable angina pectoris: Secondary | ICD-10-CM | POA: Diagnosis not present

## 2017-09-30 DIAGNOSIS — K219 Gastro-esophageal reflux disease without esophagitis: Secondary | ICD-10-CM | POA: Diagnosis present

## 2017-09-30 DIAGNOSIS — I2510 Atherosclerotic heart disease of native coronary artery without angina pectoris: Secondary | ICD-10-CM | POA: Diagnosis not present

## 2017-09-30 DIAGNOSIS — F17210 Nicotine dependence, cigarettes, uncomplicated: Secondary | ICD-10-CM | POA: Diagnosis present

## 2017-09-30 DIAGNOSIS — E785 Hyperlipidemia, unspecified: Secondary | ICD-10-CM | POA: Diagnosis not present

## 2017-09-30 DIAGNOSIS — I25110 Atherosclerotic heart disease of native coronary artery with unstable angina pectoris: Secondary | ICD-10-CM | POA: Diagnosis not present

## 2017-09-30 DIAGNOSIS — I340 Nonrheumatic mitral (valve) insufficiency: Secondary | ICD-10-CM | POA: Diagnosis not present

## 2017-09-30 DIAGNOSIS — I25810 Atherosclerosis of coronary artery bypass graft(s) without angina pectoris: Secondary | ICD-10-CM | POA: Diagnosis not present

## 2017-09-30 DIAGNOSIS — Z951 Presence of aortocoronary bypass graft: Secondary | ICD-10-CM

## 2017-09-30 DIAGNOSIS — I10 Essential (primary) hypertension: Secondary | ICD-10-CM | POA: Diagnosis present

## 2017-09-30 DIAGNOSIS — I200 Unstable angina: Secondary | ICD-10-CM | POA: Diagnosis present

## 2017-09-30 DIAGNOSIS — Z7982 Long term (current) use of aspirin: Secondary | ICD-10-CM | POA: Diagnosis not present

## 2017-09-30 DIAGNOSIS — J9811 Atelectasis: Secondary | ICD-10-CM | POA: Diagnosis not present

## 2017-09-30 DIAGNOSIS — I208 Other forms of angina pectoris: Secondary | ICD-10-CM | POA: Diagnosis present

## 2017-09-30 DIAGNOSIS — I739 Peripheral vascular disease, unspecified: Secondary | ICD-10-CM | POA: Diagnosis present

## 2017-09-30 DIAGNOSIS — D649 Anemia, unspecified: Secondary | ICD-10-CM | POA: Diagnosis present

## 2017-09-30 DIAGNOSIS — I2089 Other forms of angina pectoris: Secondary | ICD-10-CM | POA: Diagnosis present

## 2017-09-30 HISTORY — DX: Presence of aortocoronary bypass graft: Z95.1

## 2017-09-30 HISTORY — PX: CORONARY ARTERY BYPASS GRAFT: SHX141

## 2017-09-30 HISTORY — PX: TEE WITHOUT CARDIOVERSION: SHX5443

## 2017-09-30 LAB — POCT I-STAT 3, ART BLOOD GAS (G3+)
Acid-base deficit: 6 mmol/L — ABNORMAL HIGH (ref 0.0–2.0)
Acid-base deficit: 3 mmol/L — ABNORMAL HIGH (ref 0.0–2.0)
Acid-base deficit: 3 mmol/L — ABNORMAL HIGH (ref 0.0–2.0)
Acid-base deficit: 2 mmol/L (ref 0.0–2.0)
Bicarbonate: 24.9 mmol/L (ref 20.0–28.0)
Bicarbonate: 20.8 mmol/L (ref 20.0–28.0)
Bicarbonate: 23.3 mmol/L (ref 20.0–28.0)
Bicarbonate: 23.1 mmol/L (ref 20.0–28.0)
Bicarbonate: 23.1 mmol/L (ref 20.0–28.0)
O2 Saturation: 100 %
O2 Saturation: 98 %
O2 Saturation: 99 %
O2 Saturation: 97 %
O2 Saturation: 96 %
Patient temperature: 36.7
Patient temperature: 36.8
Patient temperature: 37.7
Patient temperature: 37.7
TCO2: 26 mmol/L (ref 22–32)
TCO2: 22 mmol/L (ref 22–32)
TCO2: 25 mmol/L (ref 22–32)
TCO2: 24 mmol/L (ref 22–32)
TCO2: 24 mmol/L (ref 22–32)
pCO2 arterial: 39.2 mmHg (ref 32.0–48.0)
pCO2 arterial: 45.8 mmHg (ref 32.0–48.0)
pCO2 arterial: 46 mmHg (ref 32.0–48.0)
pCO2 arterial: 46 mmHg (ref 32.0–48.0)
pCO2 arterial: 43.9 mmHg (ref 32.0–48.0)
pH, Arterial: 7.411 (ref 7.350–7.450)
pH, Arterial: 7.264 — ABNORMAL LOW (ref 7.350–7.450)
pH, Arterial: 7.31 — ABNORMAL LOW (ref 7.350–7.450)
pH, Arterial: 7.312 — ABNORMAL LOW (ref 7.350–7.450)
pH, Arterial: 7.333 — ABNORMAL LOW (ref 7.350–7.450)
pO2, Arterial: 351 mmHg — ABNORMAL HIGH (ref 83.0–108.0)
pO2, Arterial: 124 mmHg — ABNORMAL HIGH (ref 83.0–108.0)
pO2, Arterial: 173 mmHg — ABNORMAL HIGH (ref 83.0–108.0)
pO2, Arterial: 98 mmHg (ref 83.0–108.0)
pO2, Arterial: 91 mmHg (ref 83.0–108.0)

## 2017-09-30 LAB — POCT I-STAT, CHEM 8
BUN: 10 mg/dL (ref 6–20)
BUN: 9 mg/dL (ref 6–20)
BUN: 9 mg/dL (ref 6–20)
BUN: 9 mg/dL (ref 6–20)
BUN: 9 mg/dL (ref 6–20)
BUN: 9 mg/dL (ref 6–20)
Calcium, Ion: 1.29 mmol/L (ref 1.15–1.40)
Calcium, Ion: 1.28 mmol/L (ref 1.15–1.40)
Calcium, Ion: 1.13 mmol/L — ABNORMAL LOW (ref 1.15–1.40)
Calcium, Ion: 1.16 mmol/L (ref 1.15–1.40)
Calcium, Ion: 1.15 mmol/L (ref 1.15–1.40)
Calcium, Ion: 1.19 mmol/L (ref 1.15–1.40)
Chloride: 108 mmol/L (ref 101–111)
Chloride: 109 mmol/L (ref 101–111)
Chloride: 106 mmol/L (ref 101–111)
Chloride: 105 mmol/L (ref 101–111)
Chloride: 105 mmol/L (ref 101–111)
Chloride: 106 mmol/L (ref 101–111)
Creatinine, Ser: 0.8 mg/dL (ref 0.61–1.24)
Creatinine, Ser: 0.7 mg/dL (ref 0.61–1.24)
Creatinine, Ser: 0.7 mg/dL (ref 0.61–1.24)
Creatinine, Ser: 0.7 mg/dL (ref 0.61–1.24)
Creatinine, Ser: 0.7 mg/dL (ref 0.61–1.24)
Creatinine, Ser: 0.8 mg/dL (ref 0.61–1.24)
Glucose, Bld: 129 mg/dL — ABNORMAL HIGH (ref 65–99)
Glucose, Bld: 120 mg/dL — ABNORMAL HIGH (ref 65–99)
Glucose, Bld: 122 mg/dL — ABNORMAL HIGH (ref 65–99)
Glucose, Bld: 172 mg/dL — ABNORMAL HIGH (ref 65–99)
Glucose, Bld: 152 mg/dL — ABNORMAL HIGH (ref 65–99)
Glucose, Bld: 103 mg/dL — ABNORMAL HIGH (ref 65–99)
HCT: 28 % — ABNORMAL LOW (ref 39.0–52.0)
HCT: 23 % — ABNORMAL LOW (ref 39.0–52.0)
HCT: 22 % — ABNORMAL LOW (ref 39.0–52.0)
HCT: 22 % — ABNORMAL LOW (ref 39.0–52.0)
HCT: 22 % — ABNORMAL LOW (ref 39.0–52.0)
HCT: 25 % — ABNORMAL LOW (ref 39.0–52.0)
Hemoglobin: 9.5 g/dL — ABNORMAL LOW (ref 13.0–17.0)
Hemoglobin: 7.8 g/dL — ABNORMAL LOW (ref 13.0–17.0)
Hemoglobin: 7.5 g/dL — ABNORMAL LOW (ref 13.0–17.0)
Hemoglobin: 7.5 g/dL — ABNORMAL LOW (ref 13.0–17.0)
Hemoglobin: 7.5 g/dL — ABNORMAL LOW (ref 13.0–17.0)
Hemoglobin: 8.5 g/dL — ABNORMAL LOW (ref 13.0–17.0)
Potassium: 4 mmol/L (ref 3.5–5.1)
Potassium: 4.1 mmol/L (ref 3.5–5.1)
Potassium: 5.8 mmol/L — ABNORMAL HIGH (ref 3.5–5.1)
Potassium: 5 mmol/L (ref 3.5–5.1)
Potassium: 4.7 mmol/L (ref 3.5–5.1)
Potassium: 4.4 mmol/L (ref 3.5–5.1)
Sodium: 141 mmol/L (ref 135–145)
Sodium: 140 mmol/L (ref 135–145)
Sodium: 139 mmol/L (ref 135–145)
Sodium: 136 mmol/L (ref 135–145)
Sodium: 138 mmol/L (ref 135–145)
Sodium: 143 mmol/L (ref 135–145)
TCO2: 26 mmol/L (ref 22–32)
TCO2: 23 mmol/L (ref 22–32)
TCO2: 26 mmol/L (ref 22–32)
TCO2: 23 mmol/L (ref 22–32)
TCO2: 25 mmol/L (ref 22–32)
TCO2: 24 mmol/L (ref 22–32)

## 2017-09-30 LAB — CBC
HCT: 23.7 % — ABNORMAL LOW (ref 39.0–52.0)
HCT: 25.5 % — ABNORMAL LOW (ref 39.0–52.0)
Hemoglobin: 7.4 g/dL — ABNORMAL LOW (ref 13.0–17.0)
Hemoglobin: 7.9 g/dL — ABNORMAL LOW (ref 13.0–17.0)
MCH: 26.1 pg (ref 26.0–34.0)
MCH: 25.9 pg — ABNORMAL LOW (ref 26.0–34.0)
MCHC: 31.2 g/dL (ref 30.0–36.0)
MCHC: 31 g/dL (ref 30.0–36.0)
MCV: 83.5 fL (ref 78.0–100.0)
MCV: 83.6 fL (ref 78.0–100.0)
Platelets: 174 10*3/uL (ref 150–400)
Platelets: 208 10*3/uL (ref 150–400)
RBC: 2.84 MIL/uL — ABNORMAL LOW (ref 4.22–5.81)
RBC: 3.05 MIL/uL — ABNORMAL LOW (ref 4.22–5.81)
RDW: 17.4 % — ABNORMAL HIGH (ref 11.5–15.5)
RDW: 17.3 % — ABNORMAL HIGH (ref 11.5–15.5)
WBC: 10 10*3/uL (ref 4.0–10.5)
WBC: 12.7 10*3/uL — ABNORMAL HIGH (ref 4.0–10.5)

## 2017-09-30 LAB — GLUCOSE, CAPILLARY
Glucose-Capillary: 110 mg/dL — ABNORMAL HIGH (ref 65–99)
Glucose-Capillary: 110 mg/dL — ABNORMAL HIGH (ref 65–99)
Glucose-Capillary: 110 mg/dL — ABNORMAL HIGH (ref 65–99)
Glucose-Capillary: 108 mg/dL — ABNORMAL HIGH (ref 65–99)
Glucose-Capillary: 100 mg/dL — ABNORMAL HIGH (ref 65–99)
Glucose-Capillary: 101 mg/dL — ABNORMAL HIGH (ref 65–99)
Glucose-Capillary: 104 mg/dL — ABNORMAL HIGH (ref 65–99)
Glucose-Capillary: 128 mg/dL — ABNORMAL HIGH (ref 65–99)

## 2017-09-30 LAB — PLATELET COUNT: Platelets: 213 10*3/uL (ref 150–400)

## 2017-09-30 LAB — POCT I-STAT 4, (NA,K, GLUC, HGB,HCT)
Glucose, Bld: 100 mg/dL — ABNORMAL HIGH (ref 65–99)
HCT: 25 % — ABNORMAL LOW (ref 39.0–52.0)
Hemoglobin: 8.5 g/dL — ABNORMAL LOW (ref 13.0–17.0)
Potassium: 4.3 mmol/L (ref 3.5–5.1)
Sodium: 145 mmol/L (ref 135–145)

## 2017-09-30 LAB — PROTIME-INR
INR: 1.28
Prothrombin Time: 15.9 seconds — ABNORMAL HIGH (ref 11.4–15.2)

## 2017-09-30 LAB — MAGNESIUM: Magnesium: 2.8 mg/dL — ABNORMAL HIGH (ref 1.7–2.4)

## 2017-09-30 LAB — APTT: aPTT: 26 seconds (ref 24–36)

## 2017-09-30 LAB — CREATININE, SERUM
Creatinine, Ser: 0.86 mg/dL (ref 0.61–1.24)
GFR calc Af Amer: >60 mL/min (ref >60)
GFR calc non Af Amer: >60 mL/min (ref >60)

## 2017-09-30 LAB — HEMOGLOBIN AND HEMATOCRIT, BLOOD
HCT: 21.5 % — ABNORMAL LOW (ref 39.0–52.0)
Hemoglobin: 6.7 g/dL — CL (ref 13.0–17.0)

## 2017-09-30 LAB — PREPARE RBC (CROSSMATCH)

## 2017-09-30 SURGERY — CORONARY ARTERY BYPASS GRAFTING (CABG)
Anesthesia: General | Site: Chest

## 2017-09-30 MED ORDER — HEPARIN SODIUM (PORCINE) 1000 UNIT/ML IJ SOLN
INTRAMUSCULAR | Status: DC | PRN
  Administered 2017-09-30: 26000 [IU] via INTRAVENOUS

## 2017-09-30 MED ORDER — MORPHINE SULFATE (PF) 2 MG/ML IV SOLN: 1.0000 mg | INTRAVENOUS | Status: DC | PRN

## 2017-09-30 MED ORDER — FENTANYL CITRATE (PF) 250 MCG/5ML IJ SOLN
INTRAMUSCULAR | Status: AC
  Filled 2017-09-30: qty 25

## 2017-09-30 MED ORDER — SODIUM CHLORIDE 0.45 % IV SOLN
INTRAVENOUS | Status: DC | PRN
  Administered 2017-09-30: 10 mL/h via INTRAVENOUS

## 2017-09-30 MED ORDER — METOPROLOL TARTRATE 12.5 MG HALF TABLET
12.5000 mg | ORAL_TABLET | Freq: Once | ORAL | Status: AC
  Administered 2017-09-30: 12.5 mg via ORAL
  Filled 2017-09-30: qty 1

## 2017-09-30 MED ORDER — VANCOMYCIN HCL IN DEXTROSE 1-5 GM/200ML-% IV SOLN
1000.0000 mg | Freq: Once | INTRAVENOUS | Status: AC
  Administered 2017-09-30: 1000 mg via INTRAVENOUS
  Filled 2017-09-30: qty 200

## 2017-09-30 MED ORDER — NITROGLYCERIN IN D5W 200-5 MCG/ML-% IV SOLN
0.0000 ug/min | INTRAVENOUS | Status: DC
  Administered 2017-10-01: 100 ug/min via INTRAVENOUS
  Filled 2017-09-30: qty 250

## 2017-09-30 MED ORDER — 0.9 % SODIUM CHLORIDE (POUR BTL) OPTIME
TOPICAL | Status: DC | PRN
  Administered 2017-09-30: 5000 mL

## 2017-09-30 MED ORDER — DEXTROSE 5 % IV SOLN
INTRAVENOUS | Status: DC | PRN
  Administered 2017-09-30: 1.5 g via INTRAVENOUS

## 2017-09-30 MED ORDER — OXYCODONE HCL 5 MG PO TABS
5.0000 mg | ORAL_TABLET | ORAL | Status: DC | PRN
  Administered 2017-09-30 – 2017-10-04 (×9): 10 mg via ORAL
  Filled 2017-09-30 (×9): qty 2

## 2017-09-30 MED ORDER — SODIUM CHLORIDE 0.9% FLUSH
3.0000 mL | Freq: Two times a day (BID) | INTRAVENOUS | Status: DC
  Administered 2017-10-01: 3 mL via INTRAVENOUS
  Administered 2017-10-01 – 2017-10-02 (×2): 10 mL via INTRAVENOUS
  Administered 2017-10-02 – 2017-10-03 (×2): 3 mL via INTRAVENOUS
  Administered 2017-10-03: 10 mL via INTRAVENOUS
  Administered 2017-10-04: 3 mL via INTRAVENOUS

## 2017-09-30 MED ORDER — ORAL CARE MOUTH RINSE
15.0000 mL | Freq: Two times a day (BID) | OROMUCOSAL | Status: DC
  Administered 2017-09-30 – 2017-10-01 (×2): 15 mL via OROMUCOSAL

## 2017-09-30 MED ORDER — LACTATED RINGERS IV SOLN
INTRAVENOUS | Status: DC | PRN
  Administered 2017-09-30: 07:00:00 via INTRAVENOUS

## 2017-09-30 MED ORDER — ASPIRIN 81 MG PO CHEW: 324.0000 mg | CHEWABLE_TABLET | Freq: Every day | ORAL | Status: DC

## 2017-09-30 MED ORDER — INSULIN REGULAR BOLUS VIA INFUSION
0.0000 [IU] | Freq: Three times a day (TID) | INTRAVENOUS | Status: DC
  Filled 2017-09-30: qty 10

## 2017-09-30 MED ORDER — HEPARIN SODIUM (PORCINE) 1000 UNIT/ML IJ SOLN
INTRAMUSCULAR | Status: AC
  Filled 2017-09-30: qty 1

## 2017-09-30 MED ORDER — ACETAMINOPHEN 650 MG RE SUPP
650.0000 mg | Freq: Once | RECTAL | Status: AC
  Administered 2017-09-30: 650 mg via RECTAL

## 2017-09-30 MED ORDER — SODIUM CHLORIDE 0.9 % IV SOLN
INTRAVENOUS | Status: DC
  Administered 2017-09-30: 13:00:00 via INTRAVENOUS

## 2017-09-30 MED ORDER — SODIUM CHLORIDE 0.9 % IV SOLN
INTRAVENOUS | Status: AC
  Administered 2017-09-30: 13:00:00 via INTRAVENOUS

## 2017-09-30 MED ORDER — DEXTROSE 5 % IV SOLN
1.5000 g | Freq: Two times a day (BID) | INTRAVENOUS | Status: AC
  Administered 2017-09-30 – 2017-10-02 (×4): 1.5 g via INTRAVENOUS
  Filled 2017-09-30 (×4): qty 1.5

## 2017-09-30 MED ORDER — SODIUM CHLORIDE 0.9 % IV SOLN
0.0000 ug/min | INTRAVENOUS | Status: DC
  Filled 2017-09-30: qty 2

## 2017-09-30 MED ORDER — LACTATED RINGERS IV SOLN
INTRAVENOUS | Status: DC
  Administered 2017-09-30 (×2): 20 mL via INTRAVENOUS

## 2017-09-30 MED ORDER — MIDAZOLAM HCL 10 MG/2ML IJ SOLN
INTRAMUSCULAR | Status: AC
Start: 2017-09-30 — End: 2017-09-30
  Filled 2017-09-30: qty 2

## 2017-09-30 MED ORDER — METOPROLOL TARTRATE 12.5 MG HALF TABLET
12.5000 mg | ORAL_TABLET | Freq: Two times a day (BID) | ORAL | Status: DC
  Administered 2017-09-30 – 2017-10-01 (×2): 12.5 mg via ORAL
  Filled 2017-09-30 (×2): qty 1

## 2017-09-30 MED ORDER — LACTATED RINGERS IV SOLN: 500.0000 mL | Freq: Once | INTRAVENOUS | Status: DC | PRN

## 2017-09-30 MED ORDER — PANTOPRAZOLE SODIUM 40 MG PO TBEC
40.0000 mg | DELAYED_RELEASE_TABLET | Freq: Every day | ORAL | Status: DC
  Administered 2017-10-02 – 2017-10-05 (×4): 40 mg via ORAL
  Filled 2017-09-30 (×4): qty 1

## 2017-09-30 MED ORDER — SODIUM BICARBONATE 8.4 % IV SOLN
50.0000 meq | Freq: Once | INTRAVENOUS | Status: AC
  Administered 2017-09-30: 50 meq via INTRAVENOUS

## 2017-09-30 MED ORDER — ROCURONIUM BROMIDE 10 MG/ML (PF) SYRINGE
PREFILLED_SYRINGE | INTRAVENOUS | Status: AC
  Filled 2017-09-30: qty 5

## 2017-09-30 MED ORDER — MUPIROCIN 2 % EX OINT
1.0000 "application " | TOPICAL_OINTMENT | Freq: Two times a day (BID) | CUTANEOUS | Status: AC
  Administered 2017-09-30 – 2017-10-04 (×10): 1 via NASAL
  Filled 2017-09-30 (×2): qty 22

## 2017-09-30 MED ORDER — MIDAZOLAM HCL 5 MG/5ML IJ SOLN
INTRAMUSCULAR | Status: DC | PRN
  Administered 2017-09-30 (×5): 2 mg via INTRAVENOUS

## 2017-09-30 MED ORDER — CHLORHEXIDINE GLUCONATE 0.12 % MT SOLN
15.0000 mL | OROMUCOSAL | Status: AC
  Administered 2017-09-30: 15 mL via OROMUCOSAL

## 2017-09-30 MED ORDER — ROCURONIUM BROMIDE 10 MG/ML (PF) SYRINGE
PREFILLED_SYRINGE | INTRAVENOUS | Status: AC
Start: 2017-09-30 — End: 2017-09-30
  Filled 2017-09-30: qty 5

## 2017-09-30 MED ORDER — MIDAZOLAM HCL 2 MG/2ML IJ SOLN: 2.0000 mg | INTRAMUSCULAR | Status: DC | PRN

## 2017-09-30 MED ORDER — CHLORHEXIDINE GLUCONATE 0.12 % MT SOLN
15.0000 mL | Freq: Once | OROMUCOSAL | Status: AC
  Administered 2017-09-30: 15 mL via OROMUCOSAL
  Filled 2017-09-30: qty 15

## 2017-09-30 MED ORDER — MORPHINE SULFATE (PF) 4 MG/ML IV SOLN
1.0000 mg | INTRAVENOUS | Status: AC | PRN
  Administered 2017-09-30: 4 mg via INTRAVENOUS
  Filled 2017-09-30: qty 1

## 2017-09-30 MED ORDER — PLASMA-LYTE 148 IV SOLN
INTRAVENOUS | Status: DC
  Filled 2017-09-30: qty 2.5

## 2017-09-30 MED ORDER — ACETAMINOPHEN 160 MG/5ML PO SOLN: 650.0000 mg | Freq: Once | ORAL | Status: AC

## 2017-09-30 MED ORDER — FAMOTIDINE IN NACL 20-0.9 MG/50ML-% IV SOLN
20.0000 mg | Freq: Two times a day (BID) | INTRAVENOUS | Status: AC
  Administered 2017-09-30: 20 mg via INTRAVENOUS

## 2017-09-30 MED ORDER — SODIUM CHLORIDE 0.9 % IJ SOLN
INTRAMUSCULAR | Status: DC | PRN
  Administered 2017-09-30 (×4): 4 mL via TOPICAL

## 2017-09-30 MED ORDER — CHLORHEXIDINE GLUCONATE 4 % EX LIQD: 30.0000 mL | CUTANEOUS | Status: DC

## 2017-09-30 MED ORDER — ONDANSETRON HCL 4 MG/2ML IJ SOLN
INTRAMUSCULAR | Status: DC | PRN
  Administered 2017-09-30: 4 mg via INTRAVENOUS

## 2017-09-30 MED ORDER — VANCOMYCIN HCL 1000 MG IV SOLR
INTRAVENOUS | Status: DC | PRN
  Administered 2017-09-30: 1500 mg via INTRAVENOUS

## 2017-09-30 MED ORDER — METOPROLOL TARTRATE 5 MG/5ML IV SOLN
2.5000 mg | INTRAVENOUS | Status: DC | PRN
  Administered 2017-10-01 (×4): 5 mg via INTRAVENOUS
  Filled 2017-09-30 (×3): qty 5

## 2017-09-30 MED ORDER — SODIUM CHLORIDE 0.9 % IJ SOLN
INTRAMUSCULAR | Status: AC
  Filled 2017-09-30: qty 10

## 2017-09-30 MED ORDER — DOCUSATE SODIUM 100 MG PO CAPS
200.0000 mg | ORAL_CAPSULE | Freq: Every day | ORAL | Status: DC
  Administered 2017-10-01 – 2017-10-03 (×3): 200 mg via ORAL
  Filled 2017-09-30 (×3): qty 2

## 2017-09-30 MED ORDER — SODIUM CHLORIDE 0.9 % IV SOLN: 250.0000 mL | INTRAVENOUS | Status: DC

## 2017-09-30 MED ORDER — LIDOCAINE 2% (20 MG/ML) 5 ML SYRINGE
INTRAMUSCULAR | Status: DC | PRN
  Administered 2017-09-30: 100 mg via INTRAVENOUS

## 2017-09-30 MED ORDER — SODIUM CHLORIDE 0.9 % IV SOLN
0.0000 ug/kg/h | INTRAVENOUS | Status: DC
  Administered 2017-09-30: 0.5 ug/kg/h via INTRAVENOUS
  Filled 2017-09-30: qty 2

## 2017-09-30 MED ORDER — SODIUM CHLORIDE 0.9 % IV SOLN
Freq: Once | INTRAVENOUS | Status: AC
  Administered 2017-09-30: 12:00:00 via INTRAVENOUS

## 2017-09-30 MED ORDER — CHLORHEXIDINE GLUCONATE CLOTH 2 % EX PADS
6.0000 | MEDICATED_PAD | Freq: Every day | CUTANEOUS | Status: AC
  Administered 2017-09-30 – 2017-10-02 (×3): 6 via TOPICAL

## 2017-09-30 MED ORDER — DEXMEDETOMIDINE HCL IN NACL 200 MCG/50ML IV SOLN
INTRAVENOUS | Status: AC
  Filled 2017-09-30: qty 50

## 2017-09-30 MED ORDER — BISACODYL 10 MG RE SUPP: 10.0000 mg | Freq: Every day | RECTAL | Status: DC

## 2017-09-30 MED ORDER — PROTAMINE SULFATE 10 MG/ML IV SOLN
INTRAVENOUS | Status: DC | PRN
Start: 2017-09-30 — End: 2017-09-30
  Administered 2017-09-30: 250 mg via INTRAVENOUS

## 2017-09-30 MED ORDER — ONDANSETRON HCL 4 MG/2ML IJ SOLN: 4.0000 mg | Freq: Four times a day (QID) | INTRAMUSCULAR | Status: DC | PRN

## 2017-09-30 MED ORDER — BISACODYL 5 MG PO TBEC
10.0000 mg | DELAYED_RELEASE_TABLET | Freq: Every day | ORAL | Status: DC
  Administered 2017-10-01 – 2017-10-03 (×3): 10 mg via ORAL
  Filled 2017-09-30 (×3): qty 2

## 2017-09-30 MED ORDER — MILRINONE LACTATE IN DEXTROSE 20-5 MG/100ML-% IV SOLN
0.1250 ug/kg/min | INTRAVENOUS | Status: DC
  Filled 2017-09-30: qty 100

## 2017-09-30 MED ORDER — MAGNESIUM SULFATE 4 GM/100ML IV SOLN
4.0000 g | Freq: Once | INTRAVENOUS | Status: AC
  Administered 2017-09-30: 4 g via INTRAVENOUS
  Filled 2017-09-30: qty 100

## 2017-09-30 MED ORDER — DEXTROSE 5 % IV SOLN
INTRAVENOUS | Status: DC | PRN
  Administered 2017-09-30: 750 mg via INTRAVENOUS

## 2017-09-30 MED ORDER — PROPOFOL 10 MG/ML IV BOLUS
INTRAVENOUS | Status: DC | PRN
  Administered 2017-09-30: 150 mg via INTRAVENOUS

## 2017-09-30 MED ORDER — ALBUMIN HUMAN 5 % IV SOLN
250.0000 mL | INTRAVENOUS | Status: AC | PRN
  Administered 2017-09-30: 250 mL via INTRAVENOUS

## 2017-09-30 MED ORDER — HEMOSTATIC AGENTS (NO CHARGE) OPTIME
TOPICAL | Status: DC | PRN
  Administered 2017-09-30: 1 via TOPICAL

## 2017-09-30 MED ORDER — POTASSIUM CHLORIDE 10 MEQ/50ML IV SOLN: 10.0000 meq | INTRAVENOUS | Status: AC

## 2017-09-30 MED ORDER — TRAMADOL HCL 50 MG PO TABS: 50.0000 mg | ORAL_TABLET | ORAL | Status: DC | PRN

## 2017-09-30 MED ORDER — LACTATED RINGERS IV SOLN: INTRAVENOUS | Status: DC

## 2017-09-30 MED ORDER — PROPOFOL 10 MG/ML IV BOLUS
INTRAVENOUS | Status: AC
  Filled 2017-09-30: qty 20

## 2017-09-30 MED ORDER — ACETAMINOPHEN 500 MG PO TABS
1000.0000 mg | ORAL_TABLET | Freq: Four times a day (QID) | ORAL | Status: DC
  Administered 2017-09-30 – 2017-10-04 (×15): 1000 mg via ORAL
  Filled 2017-09-30 (×15): qty 2

## 2017-09-30 MED ORDER — METOPROLOL TARTRATE 25 MG/10 ML ORAL SUSPENSION: 12.5000 mg | Freq: Two times a day (BID) | ORAL | Status: DC

## 2017-09-30 MED ORDER — ALBUMIN HUMAN 5 % IV SOLN
INTRAVENOUS | Status: DC | PRN
  Administered 2017-09-30 (×2): via INTRAVENOUS

## 2017-09-30 MED ORDER — ORAL CARE MOUTH RINSE
15.0000 mL | Freq: Four times a day (QID) | OROMUCOSAL | Status: DC
  Administered 2017-09-30: 15 mL via OROMUCOSAL

## 2017-09-30 MED ORDER — FENTANYL CITRATE (PF) 250 MCG/5ML IJ SOLN
INTRAMUSCULAR | Status: DC | PRN
  Administered 2017-09-30: 50 ug via INTRAVENOUS
  Administered 2017-09-30: 150 ug via INTRAVENOUS
  Administered 2017-09-30 (×2): 50 ug via INTRAVENOUS
  Administered 2017-09-30: 150 ug via INTRAVENOUS
  Administered 2017-09-30: 100 ug via INTRAVENOUS
  Administered 2017-09-30: 50 ug via INTRAVENOUS
  Administered 2017-09-30 (×2): 150 ug via INTRAVENOUS
  Administered 2017-09-30: 50 ug via INTRAVENOUS
  Administered 2017-09-30 (×2): 150 ug via INTRAVENOUS

## 2017-09-30 MED ORDER — SODIUM CHLORIDE 0.9% FLUSH: 3.0000 mL | INTRAVENOUS | Status: DC | PRN

## 2017-09-30 MED ORDER — ROCURONIUM BROMIDE 10 MG/ML (PF) SYRINGE
PREFILLED_SYRINGE | INTRAVENOUS | Status: DC | PRN
  Administered 2017-09-30: 100 mg via INTRAVENOUS
  Administered 2017-09-30 (×2): 40 mg via INTRAVENOUS

## 2017-09-30 MED ORDER — MORPHINE SULFATE (PF) 4 MG/ML IV SOLN
1.0000 mg | INTRAVENOUS | Status: DC | PRN
  Administered 2017-09-30 – 2017-10-01 (×6): 2 mg via INTRAVENOUS
  Filled 2017-09-30 (×6): qty 1

## 2017-09-30 MED ORDER — LIDOCAINE 2% (20 MG/ML) 5 ML SYRINGE
INTRAMUSCULAR | Status: AC
Start: 2017-09-30 — End: 2017-09-30
  Filled 2017-09-30: qty 5

## 2017-09-30 MED ORDER — CHLORHEXIDINE GLUCONATE 0.12% ORAL RINSE (MEDLINE KIT)
15.0000 mL | Freq: Two times a day (BID) | OROMUCOSAL | Status: DC
Start: 2017-09-30 — End: 2017-09-30

## 2017-09-30 MED ORDER — ONDANSETRON HCL 4 MG/2ML IJ SOLN
INTRAMUSCULAR | Status: AC
  Filled 2017-09-30: qty 2

## 2017-09-30 MED ORDER — ASPIRIN EC 325 MG PO TBEC
325.0000 mg | DELAYED_RELEASE_TABLET | Freq: Every day | ORAL | Status: DC
  Administered 2017-10-01 – 2017-10-05 (×5): 325 mg via ORAL
  Filled 2017-09-30 (×5): qty 1

## 2017-09-30 MED ORDER — SODIUM CHLORIDE 0.9 % IV SOLN
INTRAVENOUS | Status: DC
  Filled 2017-09-30: qty 1

## 2017-09-30 MED ORDER — ACETAMINOPHEN 160 MG/5ML PO SOLN: 1000.0000 mg | Freq: Four times a day (QID) | ORAL | Status: DC

## 2017-09-30 MED FILL — Heparin Sodium (Porcine) Inj 1000 Unit/ML: INTRAMUSCULAR | Qty: 30 | Status: AC

## 2017-09-30 MED FILL — Potassium Chloride Inj 2 mEq/ML: INTRAVENOUS | Qty: 40 | Status: AC

## 2017-09-30 MED FILL — Magnesium Sulfate Inj 50%: INTRAMUSCULAR | Qty: 10 | Status: AC

## 2017-09-30 SURGICAL SUPPLY — 108 items
ADH SKN CLS APL DERMABOND .7 (GAUZE/BANDAGES/DRESSINGS) ×2
APPLIER CLIP 9.375 SM OPEN (CLIP) ×3
APR CLP SM 9.3 20 MLT OPN (CLIP) ×2
BAG DECANTER FOR FLEXI CONT (MISCELLANEOUS) ×5 IMPLANT
BANDAGE ACE 4X5 VEL STRL LF (GAUZE/BANDAGES/DRESSINGS) ×4 IMPLANT
BANDAGE ACE 6X5 VEL STRL LF (GAUZE/BANDAGES/DRESSINGS) ×4 IMPLANT
BASKET HEART (ORDER IN 25'S) (MISCELLANEOUS) ×1
BASKET HEART (ORDER IN 25S) (MISCELLANEOUS) ×2 IMPLANT
BLADE 11 SAFETY STRL DISP (BLADE) ×1 IMPLANT
BLADE CLIPPER SURG (BLADE) IMPLANT
BLADE STERNUM SYSTEM 6 (BLADE) ×3 IMPLANT
BNDG GAUZE ELAST 4 BULKY (GAUZE/BANDAGES/DRESSINGS) ×4 IMPLANT
CANISTER SUCT 3000ML PPV (MISCELLANEOUS) ×3 IMPLANT
CANNULA EZ GLIDE AORTIC 21FR (CANNULA) ×6 IMPLANT
CATH CPB KIT OWEN (MISCELLANEOUS) ×3 IMPLANT
CATH THORACIC 36FR (CATHETERS) ×3 IMPLANT
CLIP APPLIE 9.375 SM OPEN (CLIP) IMPLANT
CLIP FOGARTY SPRING 6M (CLIP) ×1 IMPLANT
CLIP VESOCCLUDE MED 24/CT (CLIP) ×2 IMPLANT
CLIP VESOCCLUDE SM WIDE 24/CT (CLIP) ×2 IMPLANT
CRADLE DONUT ADULT HEAD (MISCELLANEOUS) ×3 IMPLANT
DERMABOND ADVANCED (GAUZE/BANDAGES/DRESSINGS) ×1
DERMABOND ADVANCED .7 DNX12 (GAUZE/BANDAGES/DRESSINGS) IMPLANT
DRAIN CHANNEL 32F RND 10.7 FF (WOUND CARE) ×6 IMPLANT
DRAPE CARDIOVASCULAR INCISE (DRAPES) ×3
DRAPE INCISE IOBAN 66X45 STRL (DRAPES) ×3 IMPLANT
DRAPE SLUSH/WARMER DISC (DRAPES) ×3 IMPLANT
DRAPE SRG 135X102X78XABS (DRAPES) ×2 IMPLANT
DRSG AQUACEL AG ADV 3.5X14 (GAUZE/BANDAGES/DRESSINGS) ×1 IMPLANT
DRSG COVADERM 4X14 (GAUZE/BANDAGES/DRESSINGS) ×3 IMPLANT
ELECT BLADE 4.0 EZ CLEAN MEGAD (MISCELLANEOUS) ×3
ELECT REM PT RETURN 9FT ADLT (ELECTROSURGICAL) ×6
ELECTRODE BLDE 4.0 EZ CLN MEGD (MISCELLANEOUS) ×2 IMPLANT
ELECTRODE REM PT RTRN 9FT ADLT (ELECTROSURGICAL) ×4 IMPLANT
FELT TEFLON 1X6 (MISCELLANEOUS) ×5 IMPLANT
GAUZE SPONGE 4X4 12PLY STRL (GAUZE/BANDAGES/DRESSINGS) ×5 IMPLANT
GAUZE SPONGE 4X4 12PLY STRL LF (GAUZE/BANDAGES/DRESSINGS) ×3 IMPLANT
GLOVE BIO SURGEON STRL SZ 6.5 (GLOVE) ×7 IMPLANT
GLOVE BIOGEL PI IND STRL 6 (GLOVE) IMPLANT
GLOVE BIOGEL PI IND STRL 6.5 (GLOVE) IMPLANT
GLOVE BIOGEL PI INDICATOR 6 (GLOVE) ×3
GLOVE BIOGEL PI INDICATOR 6.5 (GLOVE) ×4
GLOVE ORTHO TXT STRL SZ7.5 (GLOVE) ×6 IMPLANT
GOWN STRL REUS W/ TWL LRG LVL3 (GOWN DISPOSABLE) ×8 IMPLANT
GOWN STRL REUS W/TWL LRG LVL3 (GOWN DISPOSABLE) ×27
HEMOSTAT POWDER SURGIFOAM 1G (HEMOSTASIS) ×9 IMPLANT
INSERT FOGARTY XLG (MISCELLANEOUS) ×3 IMPLANT
KIT BASIN OR (CUSTOM PROCEDURE TRAY) ×3 IMPLANT
KIT ROOM TURNOVER OR (KITS) ×3 IMPLANT
KIT SUCTION CATH 14FR (SUCTIONS) ×9 IMPLANT
KIT VASOVIEW HEMOPRO VH 3000 (KITS) ×3 IMPLANT
LEAD PACING MYOCARDI (MISCELLANEOUS) ×3 IMPLANT
MARKER GRAFT CORONARY BYPASS (MISCELLANEOUS) ×9 IMPLANT
NS IRRIG 1000ML POUR BTL (IV SOLUTION) ×15 IMPLANT
PACK E OPEN HEART (SUTURE) ×3 IMPLANT
PACK OPEN HEART (CUSTOM PROCEDURE TRAY) ×3 IMPLANT
PAD ARMBOARD 7.5X6 YLW CONV (MISCELLANEOUS) ×6 IMPLANT
PAD ELECT DEFIB RADIOL ZOLL (MISCELLANEOUS) ×3 IMPLANT
PENCIL BUTTON HOLSTER BLD 10FT (ELECTRODE) ×3 IMPLANT
PUNCH AORTIC ROT 4.0MM RCL 40 (MISCELLANEOUS) ×1 IMPLANT
PUNCH AORTIC ROTATE 4.0MM (MISCELLANEOUS) IMPLANT
PUNCH AORTIC ROTATE 4.5MM 8IN (MISCELLANEOUS) IMPLANT
PUNCH AORTIC ROTATE 5MM 8IN (MISCELLANEOUS) IMPLANT
SET CARDIOPLEGIA MPS 5001102 (MISCELLANEOUS) ×1 IMPLANT
SOLUTION ANTI FOG 6CC (MISCELLANEOUS) IMPLANT
SPONGE LAP 18X18 X RAY DECT (DISPOSABLE) ×1 IMPLANT
SPONGE LAP 4X18 X RAY DECT (DISPOSABLE) ×1 IMPLANT
SURGICEL SNOW 4INWX4INL (HEMOSTASIS) ×1 IMPLANT
SUT BONE WAX W31G (SUTURE) ×3 IMPLANT
SUT ETHIBOND X763 2 0 SH 1 (SUTURE) ×8 IMPLANT
SUT MNCRL AB 3-0 PS2 18 (SUTURE) ×6 IMPLANT
SUT MNCRL AB 4-0 PS2 18 (SUTURE) ×2 IMPLANT
SUT PDS AB 1 CTX 36 (SUTURE) ×6 IMPLANT
SUT PROLENE 2 0 SH DA (SUTURE) IMPLANT
SUT PROLENE 3 0 SH DA (SUTURE) ×3 IMPLANT
SUT PROLENE 3 0 SH1 36 (SUTURE) ×1 IMPLANT
SUT PROLENE 4 0 RB 1 (SUTURE) ×9
SUT PROLENE 4 0 SH DA (SUTURE) ×3 IMPLANT
SUT PROLENE 4-0 RB1 .5 CRCL 36 (SUTURE) IMPLANT
SUT PROLENE 5 0 C 1 36 (SUTURE) ×1 IMPLANT
SUT PROLENE 6 0 C 1 30 (SUTURE) ×3 IMPLANT
SUT PROLENE 7.0 RB 3 (SUTURE) ×16 IMPLANT
SUT PROLENE 8 0 BV175 6 (SUTURE) IMPLANT
SUT PROLENE BLUE 7 0 (SUTURE) ×5 IMPLANT
SUT PROLENE POLY MONO (SUTURE) IMPLANT
SUT SILK  1 MH (SUTURE) ×1
SUT SILK 1 MH (SUTURE) ×2 IMPLANT
SUT STEEL 6MS V (SUTURE) IMPLANT
SUT STEEL STERNAL CCS#1 18IN (SUTURE) IMPLANT
SUT STEEL SZ 6 DBL 3X14 BALL (SUTURE) IMPLANT
SUT VIC AB 1 CTX 36 (SUTURE)
SUT VIC AB 1 CTX36XBRD ANBCTR (SUTURE) IMPLANT
SUT VIC AB 2-0 CT1 27 (SUTURE) ×6
SUT VIC AB 2-0 CT1 TAPERPNT 27 (SUTURE) IMPLANT
SUT VIC AB 2-0 CTX 27 (SUTURE) IMPLANT
SUT VIC AB 3-0 SH 27 (SUTURE)
SUT VIC AB 3-0 SH 27X BRD (SUTURE) IMPLANT
SUT VIC AB 3-0 X1 27 (SUTURE) IMPLANT
SUT VICRYL 4-0 PS2 18IN ABS (SUTURE) IMPLANT
SYSTEM SAHARA CHEST DRAIN ATS (WOUND CARE) ×3 IMPLANT
TAPE CLOTH SURG 4X10 WHT LF (GAUZE/BANDAGES/DRESSINGS) ×1 IMPLANT
TAPE PAPER 2X10 WHT MICROPORE (GAUZE/BANDAGES/DRESSINGS) ×1 IMPLANT
TOWEL GREEN STERILE (TOWEL DISPOSABLE) ×3 IMPLANT
TOWEL GREEN STERILE FF (TOWEL DISPOSABLE) ×2 IMPLANT
TRAY FOLEY SILVER 16FR TEMP (SET/KITS/TRAYS/PACK) ×3 IMPLANT
TUBING INSUFFLATION (TUBING) ×3 IMPLANT
UNDERPAD 30X30 (UNDERPADS AND DIAPERS) ×3 IMPLANT
WATER STERILE IRR 1000ML POUR (IV SOLUTION) ×6 IMPLANT

## 2017-09-30 NOTE — Progress Notes (Signed)
Mode changed to PC/CPAP of 10/5 per rapid weaning protocol.

## 2017-09-30 NOTE — Progress Notes (Signed)
  Echocardiogram Echocardiogram Transesophageal has been performed.  Bobbye Charleston 09/30/2017, 9:51 AM

## 2017-09-30 NOTE — Op Note (Signed)
CARDIOTHORACIC SURGERY OPERATIVE NOTE  Date of Procedure: 09/30/2017  Preoperative Diagnosis:   Severe 3-vessel Coronary Artery Disease  Stable Angina Pectoris  Postoperative Diagnosis: Same  Procedure:    Coronary Artery Bypass Grafting x 5    Left Internal Mammary Artery to Distal Left Anterior Descending Coronary Artery  Saphenous Vein Graft to Acute Marginal Branch of Right Coronary Artery  Saphenous Vein Graft to First Obtuse Marginal Branch of Left Circumflex Coronary Artery  Sequential Saphenous Vein Graft to Second Obtuse Marginal Branch of Left Circumflex Coronary Artery   Saphenous Vein Graft to Distal Left Circumflex Coronary Artery  Endoscopic Vein Harvest from Bilateral Thighs  Surgeon: Valentina Gu. Roxy Manns, MD  Assistant: Nani Skillern, PA-C  Anesthesia: Nelda Severe. Tobias Alexander, MD  Operative Findings:  Mild left ventricular systolic dysfunction  Good quality left internal mammary artery conduit  Good quality saphenous vein conduit  Good quality target vessels for grafting  Moderate atherosclerotic plaque involving the ascending aorta    BRIEF CLINICAL NOTE AND INDICATIONS FOR SURGERY  Patient is a 71 year old African-American male with no previous history of coronary artery disease but risk factors notable for history of hypertension, hyperlipidemia, and long-standing tobacco abuse who has been referred for surgical consultation to discuss treatment options for management of severe left main and three-vessel coronary artery disease with stable angina pectoris.  The patient describes recent onset burning substernal chest discomfort that occurs only when he is walking or doing something more strenuous.  He underwent a stress echo that was reportedly abnormal and subsequently underwent diagnostic cardiac catheterization by Dr. Nehemiah Massed on September 06, 2017.  He was found to have left main and three-vessel coronary artery disease with preserved left ventricular  systolic function.  Elective cardiothoracic surgical consultation was requested.  The patient has been seen in consultation and counseled at length regarding the indications, risks and potential benefits of surgery.  All questions have been answered, and the patient provides full informed consent for the operation as described.     DETAILS OF THE OPERATIVE PROCEDURE  Preparation:  The patient is brought to the operating room on the above mentioned date and central monitoring was established by the anesthesia team including placement of Swan-Ganz catheter and radial arterial line. The patient is placed in the supine position on the operating table.  Intravenous antibiotics are administered. General endotracheal anesthesia is induced uneventfully. A Foley catheter is placed.  Baseline transesophageal echocardiogram was performed.  Findings were notable for mild LV systolic dysfunction.  There was trace to mild central mitral regurgitation.  The patient's chest, abdomen, both groins, and both lower extremities are prepared and draped in a sterile manner. A time out procedure is performed.   Surgical Approach and Conduit Harvest:  A median sternotomy incision was performed and the left internal mammary artery is dissected from the chest wall and prepared for bypass grafting. The left internal mammary artery is notably good quality conduit. Simultaneously, the greater saphenous vein is obtained from the patient's right and left thighs using endoscopic vein harvest technique. The saphenous vein is notably good quality conduit. After removal of the saphenous vein, the small surgical incisions in the lower extremity are closed with absorbable suture. Following systemic heparinization, the left internal mammary artery was transected distally noted to have excellent flow.   Extracorporeal Cardiopulmonary Bypass and Myocardial Protection:  The pericardium is opened. The ascending aorta is sclerotic in  appearance. The ascending aorta and the right atrium are cannulated for cardiopulmonary bypass.  Adequate  heparinization is verified.     The entire pre-bypass portion of the operation was notable for stable hemodynamics.  Cardiopulmonary bypass was begun and the surface of the heart is inspected. Distal target vessels are selected for coronary artery bypass grafting. A cardioplegia cannula is placed in the ascending aorta.  A temperature probe was placed in the interventricular septum.  The patient is allowed to cool passively to [facility] systemic temperature.  The aortic cross clamp is applied and cold blood cardioplegia is delivered initially in an antegrade fashion through the aortic root.   Iced saline slush is applied for topical hypothermia.  The initial cardioplegic arrest is rapid with early diastolic arrest.  Repeat doses of cardioplegia are administered intermittently throughout the entire cross clamp portion of the operation through the aortic root and through subsequently placed vein grafts in order to maintain completely flat electrocardiogram and septal myocardial temperature below 15C.  Myocardial protection was felt to be excellent.  Coronary Artery Bypass Grafting:   The acute marginal branch of the right coronary artery was grafted using a reversed saphenous vein graft in an end-to-side fashion.  At the site of distal anastomosis the target vessel was good quality and measured approximately 1.8 mm in diameter.  The first obtuse marginal branch of the left circumflex coronary artery was grafted using a reversed saphenous vein graft in an side-to-side fashion.  At the site of distal anastomosis the target vessel was good quality and measured approximately 1.8 mm in diameter.  The second obtuse marginal branch of the left circumflex coronary artery was grafted in and end-to-side fashion using a sequential saphenous vein graft off of the distal segment of the vein graft placed to the first  obtuse marginal branch coronary artery.  At the site of distal anastomosis the target vessel was good quality and measured approximately 2.0 mm in diameter.  The distal left circumflex coronary artery was grafted using a reversed saphenous vein graft in an side-to-side fashion.  At the site of distal anastomosis the target vessel was good quality and measured approximately 1.8 mm in diameter.  The distal left anterior coronary artery was grafted with the left internal mammary artery in an end-to-side fashion.  At the site of distal anastomosis the target vessel was good quality and measured approximately 2.0 mm in diameter.  All proximal vein graft anastomoses were placed directly to the ascending aorta prior to removal of the aortic cross clamp.  There was moderate atherosclerotic plaque involving the aorta. The septal myocardial temperature rose rapidly after reperfusion of the left internal mammary artery graft.  The aortic cross clamp was removed after a total cross clamp time of 89 minutes.   Procedure Completion:  All proximal and distal coronary anastomoses were inspected for hemostasis and appropriate graft orientation. Epicardial pacing wires are fixed to the right ventricular outflow tract and to the right atrial appendage. The patient is rewarmed to 37C temperature. The patient is weaned and disconnected from cardiopulmonary bypass.  The patient's rhythm at separation from bypass was AV paced.  The patient was weaned from cardiopulmonary bypass  without any inotropic support. Total cardiopulmonary bypass time for the operation was 109 minutes.  Followup transesophageal echocardiogram performed after separation from bypass revealed no changes from the preoperative exam.  The aortic and venous cannula were removed uneventfully. Protamine was administered to reverse the anticoagulation. The mediastinum and pleural space were inspected for hemostasis and irrigated with saline solution. The  mediastinum and the left pleural space were drained  using 3 chest tubes placed through separate stab incisions inferiorly.  The soft tissues anterior to the aorta were reapproximated loosely. The sternum is closed with double strength sternal wire. The soft tissues anterior to the sternum were closed in multiple layers and the skin is closed with a running subcuticular skin closure.  The post-bypass portion of the operation was notable for stable rhythm and hemodynamics.  No blood products were administered during the operation.   Disposition:  The patient tolerated the procedure well and is transported to the surgical intensive care in stable condition. There are no intraoperative complications. All sponge instrument and needle counts are verified correct at completion of the operation.    Valentina Gu. Roxy Manns MD 09/30/2017 12:38 PM

## 2017-09-30 NOTE — Anesthesia Procedure Notes (Addendum)
Procedure Name: Intubation Date/Time: 09/30/2017 7:45 AM Performed by: Kyung Rudd, CRNA Pre-anesthesia Checklist: Patient identified, Emergency Drugs available, Suction available and Patient being monitored Patient Re-evaluated:Patient Re-evaluated prior to induction Oxygen Delivery Method: Circle system utilized Preoxygenation: Pre-oxygenation with 100% oxygen Induction Type: IV induction Ventilation: Mask ventilation without difficulty Laryngoscope Size: Miller and 2 Grade View: Grade II Tube type: Oral Tube size: 8.0 mm Number of attempts: 2 Airway Equipment and Method: Stylet Placement Confirmation: ETT inserted through vocal cords under direct vision,  positive ETCO2 and breath sounds checked- equal and bilateral Secured at: 22 cm Tube secured with: Tape Dental Injury: Teeth and Oropharynx as per pre-operative assessment and Injury to tongue  Comments: DL x1 with MAC 4 unable to visualize vocal cords per CRNA. AOI on 2nd DL with miller 2, Grade II view per MDA.

## 2017-09-30 NOTE — Procedures (Signed)
Extubation Procedure Note  Patient Details:   Name: Craig [name] DOB: 1946-03-04 MRN: [id number]   Airway Documentation:    Positive cuff leak prior to extubation.  Evaluation  O2 sats: stable throughout Complications: No apparent complications Patient did tolerate procedure well. Bilateral Breath Sounds: Clear, Diminished   Yes   Pt extubated to 4L [place] with no distress or stridor noted and RN at bedside. Pt instructed on use of incentive spirometer and left at bedside.   Elwin Mocha 09/30/2017, 6:16 PM

## 2017-09-30 NOTE — Anesthesia Preprocedure Evaluation (Addendum)
Anesthesia Evaluation  Patient identified by MRN, date of birth, ID band Patient awake    Reviewed: Allergy & Precautions, NPO status , Patient's Chart, lab work & pertinent test results  History of Anesthesia Complications Negative for: history of anesthetic complications  Airway Mallampati: II  TM Distance: >3 FB Neck ROM: Full    Dental no notable dental hx. (+) Dental Advisory Given, Teeth Intact   Pulmonary neg pulmonary ROS, Current Smoker,    Pulmonary exam normal        Cardiovascular hypertension, + angina + CAD  Normal cardiovascular exam  Study Conclusions  - Left ventricle: The cavity size was normal. There was moderate concentric hypertrophy. Systolic function was mildly reduced. The estimated ejection fraction was in the range of 45% to 50%. There is hypokinesis of the inferolateral myocardium. Doppler parameters are consistent with abnormal left ventricular relaxation (grade 1 diastolic dysfunction).     Neuro/Psych negative neurological ROS  negative psych ROS   GI/Hepatic Neg liver ROS, GERD  ,  Endo/Other  negative endocrine ROS  Renal/GU negative Renal ROS     Musculoskeletal negative musculoskeletal ROS (+)   Abdominal   Peds  Hematology negative hematology ROS (+)   Anesthesia Other Findings Day of surgery medications reviewed with the patient.  Reproductive/Obstetrics                           Anesthesia Physical Anesthesia Plan  ASA: IV  Anesthesia Plan: General   Post-op Pain Management:    Induction:   PONV Risk Score and Plan: 2 and Ondansetron, Midazolam and Treatment may vary due to age or medical condition  Airway Management Planned: Oral ETT  Additional Equipment: Arterial line, PA Cath, 3D TEE and Ultrasound Guidance Line Placement  Intra-op Plan:   Post-operative Plan: Post-operative intubation/ventilation  Informed Consent: I have  reviewed the patients History and Physical, chart, labs and discussed the procedure including the risks, benefits and alternatives for the proposed anesthesia with the patient or authorized representative who has indicated his/her understanding and acceptance.   Dental advisory given  Plan Discussed with: CRNA and Anesthesiologist  Anesthesia Plan Comments:        Anesthesia Quick Evaluation

## 2017-09-30 NOTE — Anesthesia Postprocedure Evaluation (Signed)
Anesthesia Post Note  Patient: Craig [name]  Procedure(s) Performed: CORONARY ARTERY BYPASS GRAFTING times five using right and left Saphaneous vein harvested endoscopicly  and left internal mammary artery. (CABG),TEE (N/A Chest) TRANSESOPHAGEAL ECHOCARDIOGRAM (TEE) (N/A )     Patient location during evaluation: SICU Anesthesia Type: General Level of consciousness: sedated Pain management: pain level controlled Vital Signs Assessment: post-procedure vital signs reviewed and stable Respiratory status: patient remains intubated per anesthesia plan Cardiovascular status: stable Postop Assessment: no apparent nausea or vomiting Anesthetic complications: no    Last Vitals:  Vitals:   09/30/17 1455 09/30/17 1500  BP:    Pulse: 78 78  Resp: 16 16  Temp: 36.8 C 36.8 C  SpO2: 100% 100%    Last Pain:  Vitals:   09/30/17 0545  TempSrc: Oral                 Mersedes Alber DANIEL

## 2017-09-30 NOTE — Progress Notes (Signed)
      [address]       [place] [address]             [phone]      Extubated BP (!) 146/60   Pulse 93   Temp 100 F (37.8 C)   Resp 15   Ht 5' 8.5" (1.74 m)   Wt 188 lb (85.3 kg)   SpO2 100%   BMI 28.17 kg/m   Intake/Output Summary (Last 24 hours) at 09/30/2017 1917 Last data filed at 09/30/2017 1900 Gross per 24 hour  Intake 3947.8 ml  Output 3148 ml  Net 799.8 ml   Doing well early postop  Remo Lipps C. Roxan Hockey, MD [facility] [phone]

## 2017-09-30 NOTE — Interval H&P Note (Signed)
History and Physical Interval Note:  09/30/2017 5:28 AM  Craig [name]  has presented today for surgery, with the diagnosis of multi vessel coronary artery disease  The various methods of treatment have been discussed with the patient and family. After consideration of risks, benefits and other options for treatment, the patient has consented to  Procedure(s): CORONARY ARTERY BYPASS GRAFTING (CABG),TEE (N/A) TRANSESOPHAGEAL ECHOCARDIOGRAM (TEE) (N/A) as a surgical intervention .  The patient's history has been reviewed, patient examined, no change in status, stable for surgery.  I have reviewed the patient's chart and labs.  Questions were answered to the patient's satisfaction.     Rexene Alberts

## 2017-09-30 NOTE — Progress Notes (Signed)
-  20 NIF and VC 1.0L with good pt effort. RN aware of weaning parameters.

## 2017-09-30 NOTE — Anesthesia Procedure Notes (Signed)
Central Venous Catheter Insertion Performed by: Duane Boston, MD, anesthesiologist Start/End12/21/2018 6:48 AM, 09/30/2017 6:58 AM Patient location: Pre-op. Preanesthetic checklist: patient identified, IV checked, site marked, risks and benefits discussed, surgical consent, monitors and equipment checked, pre-op evaluation, timeout performed and anesthesia consent Position: Trendelenburg Lidocaine 1% used for infiltration and patient sedated Hand hygiene performed , maximum sterile barriers used  and Seldinger technique used Catheter size: 8.5 Fr Total catheter length 8. PA cath was placed.Sheath introducer Swan type:thermodilution PA Cath depth:50 Procedure performed using ultrasound guided technique. Ultrasound Notes:anatomy identified, needle tip was noted to be adjacent to the nerve/plexus identified, no ultrasound evidence of intravascular and/or intraneural injection and image(s) printed for medical record Attempts: 1 Following insertion, line sutured, dressing applied and Biopatch. Post procedure assessment: free fluid flow, blood return through all ports and no air  Patient tolerated the procedure well with no immediate complications.

## 2017-09-30 NOTE — Progress Notes (Signed)
RR lowered to 4 and FIO2 to 40% per rapid weaning protocol.

## 2017-09-30 NOTE — Brief Op Note (Signed)
09/30/2017  12:35 PM  PATIENT:  Craig [name]  71 y.o. male  PRE-OPERATIVE DIAGNOSIS:  Multi vessel coronary artery disease  POST-OPERATIVE DIAGNOSIS:  Multi vessel coronary artery disease  PROCEDURE:  TRANSESOPHAGEAL ECHOCARDIOGRAM (TEE) MEDIAN STERNOTOMY for CORONARY ARTERY BYPASS GRAFTING x 5 (LIMA to LAD, SVG to DISTAL CIRCUMFLEX, SVG SEQUENTIALLY to OM1 and OM2, SVG to ACUTE MARGINAL) using RIGHT and LEFT GREATER SAPHENOUS VEIN harvested endoscopicly  and left internal mammary artery.  SURGEON:  Surgeon(s) and Role:    Rexene Alberts, MD - Primary  PHYSICIAN ASSISTANT: Lars Pinks PA-C  ASSISTANTS: Sharee Pimple RNFA  ANESTHESIA:   general  EBL:  1110 mL   DRAINS: Chest tubes placed in the mediastinal and pleural spaces   COUNTS CORRECT:  YES  DICTATION: .Dragon Dictation  PLAN OF CARE: Admit to inpatient   PATIENT DISPOSITION:  ICU - intubated and hemodynamically stable.   Delay start of Pharmacological VTE agent (>24hrs) due to surgical blood loss or risk of bleeding: yes  BASELINE WEIGHT: 85 kg

## 2017-09-30 NOTE — Transfer of Care (Signed)
Immediate Anesthesia Transfer of Care Note  Patient: Craig [name]  Procedure(s) Performed: CORONARY ARTERY BYPASS GRAFTING times five using right and left Saphaneous vein harvested endoscopicly  and left internal mammary artery. (CABG),TEE (N/A Chest) TRANSESOPHAGEAL ECHOCARDIOGRAM (TEE) (N/A )  Patient Location: SICU  Anesthesia Type:General  Level of Consciousness: sedated and Patient remains intubated per anesthesia plan  Airway & Oxygen Therapy: Patient remains intubated per anesthesia plan and Patient placed on Ventilator (see vital sign flow sheet for setting)  Post-op Assessment: Report given to RN and Post -op Vital signs reviewed and stable  Post vital signs: Reviewed and stable  Last Vitals:  Vitals:   09/30/17 0554 09/30/17 0623  BP: (!) 175/74 (!) 167/65  Pulse:  75  Resp:    Temp:    SpO2:      Last Pain:  Vitals:   09/30/17 0545  TempSrc: Oral      Patients Stated Pain Goal: 3 (07/68/08 8110)  Complications: No apparent anesthesia complications

## 2017-09-30 NOTE — Anesthesia Procedure Notes (Signed)
Arterial Line Insertion Start/End12/21/2018 7:05 AM, 09/30/2017 7:05 AM Performed by: Kyung Rudd, CRNA, CRNA  Preanesthetic checklist: patient identified, site marked, risks and benefits discussed, surgical consent, monitors and equipment checked, pre-op evaluation and timeout performed Lidocaine 1% used for infiltration and patient sedated radial was placed Catheter size: 20 G Hand hygiene performed , maximum sterile barriers used  and Seldinger technique used Allen's test indicative of satisfactory collateral circulation Attempts: 1 Procedure performed without using ultrasound guided technique. Following insertion, dressing applied and Biopatch. Post procedure assessment: normal  Patient tolerated the procedure well with no immediate complications.

## 2017-10-01 ENCOUNTER — Inpatient Hospital Stay (HOSPITAL_COMMUNITY): Payer: Medicare Other

## 2017-10-01 ENCOUNTER — Other Ambulatory Visit: Payer: Self-pay

## 2017-10-01 LAB — CBC
HCT: 24.6 % — ABNORMAL LOW (ref 39.0–52.0)
HCT: 25 % — ABNORMAL LOW (ref 39.0–52.0)
HCT: 25.7 % — ABNORMAL LOW (ref 39.0–52.0)
Hemoglobin: 7.8 g/dL — ABNORMAL LOW (ref 13.0–17.0)
Hemoglobin: 7.7 g/dL — ABNORMAL LOW (ref 13.0–17.0)
Hemoglobin: 7.7 g/dL — ABNORMAL LOW (ref 13.0–17.0)
MCH: 26.6 pg (ref 26.0–34.0)
MCH: 25.9 pg — ABNORMAL LOW (ref 26.0–34.0)
MCH: 25.6 pg — ABNORMAL LOW (ref 26.0–34.0)
MCHC: 31.7 g/dL (ref 30.0–36.0)
MCHC: 30.8 g/dL (ref 30.0–36.0)
MCHC: 30 g/dL (ref 30.0–36.0)
MCV: 84 fL (ref 78.0–100.0)
MCV: 84.2 fL (ref 78.0–100.0)
MCV: 85.4 fL (ref 78.0–100.0)
Platelets: 201 10*3/uL (ref 150–400)
Platelets: 208 10*3/uL (ref 150–400)
Platelets: 217 10*3/uL (ref 150–400)
RBC: 2.93 MIL/uL — ABNORMAL LOW (ref 4.22–5.81)
RBC: 2.97 MIL/uL — ABNORMAL LOW (ref 4.22–5.81)
RBC: 3.01 MIL/uL — ABNORMAL LOW (ref 4.22–5.81)
RDW: 17.5 % — ABNORMAL HIGH (ref 11.5–15.5)
RDW: 17.6 % — ABNORMAL HIGH (ref 11.5–15.5)
RDW: 17.9 % — ABNORMAL HIGH (ref 11.5–15.5)
WBC: 13.7 10*3/uL — ABNORMAL HIGH (ref 4.0–10.5)
WBC: 15.2 10*3/uL — ABNORMAL HIGH (ref 4.0–10.5)
WBC: 13 10*3/uL — ABNORMAL HIGH (ref 4.0–10.5)

## 2017-10-01 LAB — GLUCOSE, CAPILLARY
Glucose-Capillary: 101 mg/dL — ABNORMAL HIGH (ref 65–99)
Glucose-Capillary: 107 mg/dL — ABNORMAL HIGH (ref 65–99)
Glucose-Capillary: 114 mg/dL — ABNORMAL HIGH (ref 65–99)
Glucose-Capillary: 117 mg/dL — ABNORMAL HIGH (ref 65–99)
Glucose-Capillary: 136 mg/dL — ABNORMAL HIGH (ref 65–99)
Glucose-Capillary: 139 mg/dL — ABNORMAL HIGH (ref 65–99)
Glucose-Capillary: 140 mg/dL — ABNORMAL HIGH (ref 65–99)
Glucose-Capillary: 159 mg/dL — ABNORMAL HIGH (ref 65–99)
Glucose-Capillary: 95 mg/dL (ref 65–99)
Glucose-Capillary: 97 mg/dL (ref 65–99)

## 2017-10-01 LAB — POCT I-STAT, CHEM 8
BUN: 13 mg/dL (ref 6–20)
Calcium, Ion: 1.22 mmol/L (ref 1.15–1.40)
Chloride: 100 mmol/L — ABNORMAL LOW (ref 101–111)
Creatinine, Ser: 0.8 mg/dL (ref 0.61–1.24)
Glucose, Bld: 145 mg/dL — ABNORMAL HIGH (ref 65–99)
HCT: 26 % — ABNORMAL LOW (ref 39.0–52.0)
Hemoglobin: 8.8 g/dL — ABNORMAL LOW (ref 13.0–17.0)
Potassium: 4.2 mmol/L (ref 3.5–5.1)
Sodium: 138 mmol/L (ref 135–145)
TCO2: 25 mmol/L (ref 22–32)

## 2017-10-01 LAB — BASIC METABOLIC PANEL
Anion gap: 6 (ref 5–15)
BUN: 10 mg/dL (ref 6–20)
CO2: 23 mmol/L (ref 22–32)
Calcium: 8.3 mg/dL — ABNORMAL LOW (ref 8.9–10.3)
Chloride: 108 mmol/L (ref 101–111)
Creatinine, Ser: 0.88 mg/dL (ref 0.61–1.24)
GFR calc Af Amer: >60 mL/min (ref >60)
GFR calc non Af Amer: >60 mL/min (ref >60)
Glucose, Bld: 109 mg/dL — ABNORMAL HIGH (ref 65–99)
Potassium: 4.3 mmol/L (ref 3.5–5.1)
Sodium: 137 mmol/L (ref 135–145)

## 2017-10-01 LAB — CREATININE, SERUM
Creatinine, Ser: 0.91 mg/dL (ref 0.61–1.24)
Creatinine, Ser: 0.87 mg/dL (ref 0.61–1.24)
GFR calc Af Amer: >60 mL/min (ref >60)
GFR calc Af Amer: >60 mL/min (ref >60)
GFR calc non Af Amer: >60 mL/min (ref >60)
GFR calc non Af Amer: >60 mL/min (ref >60)

## 2017-10-01 LAB — MAGNESIUM
Magnesium: 2.5 mg/dL — ABNORMAL HIGH (ref 1.7–2.4)
Magnesium: 2.5 mg/dL — ABNORMAL HIGH (ref 1.7–2.4)

## 2017-10-01 MED ORDER — CLONIDINE HCL 0.1 MG PO TABS
0.1000 mg | ORAL_TABLET | Freq: Three times a day (TID) | ORAL | Status: DC
  Administered 2017-10-01 – 2017-10-05 (×13): 0.1 mg via ORAL
  Filled 2017-10-01 (×13): qty 1

## 2017-10-01 MED ORDER — INSULIN ASPART 100 UNIT/ML ~~LOC~~ SOLN: 0.0000 [IU] | SUBCUTANEOUS | Status: DC

## 2017-10-01 MED ORDER — DILTIAZEM HCL ER COATED BEADS 180 MG PO CP24
300.0000 mg | ORAL_CAPSULE | Freq: Every day | ORAL | Status: DC
  Administered 2017-10-01 – 2017-10-05 (×5): 300 mg via ORAL
  Filled 2017-10-01 (×5): qty 1

## 2017-10-01 MED ORDER — HYDRALAZINE HCL 20 MG/ML IJ SOLN: 10.0000 mg | Freq: Four times a day (QID) | INTRAMUSCULAR | Status: DC | PRN

## 2017-10-01 MED ORDER — GABAPENTIN 300 MG PO CAPS
600.0000 mg | ORAL_CAPSULE | Freq: Three times a day (TID) | ORAL | Status: DC
  Administered 2017-10-01 – 2017-10-05 (×13): 600 mg via ORAL
  Filled 2017-10-01 (×13): qty 2

## 2017-10-01 MED ORDER — ENOXAPARIN SODIUM 40 MG/0.4ML ~~LOC~~ SOLN
40.0000 mg | Freq: Every day | SUBCUTANEOUS | Status: DC
  Administered 2017-10-01 – 2017-10-04 (×4): 40 mg via SUBCUTANEOUS
  Filled 2017-10-01 (×4): qty 0.4

## 2017-10-01 MED ORDER — METOPROLOL TARTRATE 25 MG PO TABS
25.0000 mg | ORAL_TABLET | Freq: Two times a day (BID) | ORAL | Status: DC
  Administered 2017-10-01 – 2017-10-05 (×8): 25 mg via ORAL
  Filled 2017-10-01 (×8): qty 1

## 2017-10-01 MED ORDER — ORAL CARE MOUTH RINSE
15.0000 mL | Freq: Two times a day (BID) | OROMUCOSAL | Status: DC
  Administered 2017-10-01 – 2017-10-05 (×5): 15 mL via OROMUCOSAL

## 2017-10-01 MED ORDER — CYCLOBENZAPRINE HCL 10 MG PO TABS: 10.0000 mg | ORAL_TABLET | Freq: Three times a day (TID) | ORAL | Status: DC | PRN

## 2017-10-01 MED ORDER — IRBESARTAN 300 MG PO TABS
300.0000 mg | ORAL_TABLET | Freq: Every day | ORAL | Status: DC
  Administered 2017-10-02 – 2017-10-05 (×4): 300 mg via ORAL
  Filled 2017-10-01 (×4): qty 1

## 2017-10-01 MED ORDER — INSULIN ASPART 100 UNIT/ML ~~LOC~~ SOLN
0.0000 [IU] | SUBCUTANEOUS | Status: DC
  Administered 2017-10-01 – 2017-10-02 (×4): 2 [IU] via SUBCUTANEOUS

## 2017-10-01 MED ORDER — IRBESARTAN 300 MG PO TABS: 300.0000 mg | ORAL_TABLET | Freq: Every day | ORAL | Status: DC

## 2017-10-01 MED ORDER — FUROSEMIDE 10 MG/ML IJ SOLN
40.0000 mg | Freq: Once | INTRAMUSCULAR | Status: AC
  Administered 2017-10-01: 40 mg via INTRAVENOUS
  Filled 2017-10-01: qty 4

## 2017-10-01 NOTE — Progress Notes (Signed)
      [address]       [place], [address]             [phone]      Visiting with family  BP 132/73   Pulse 89   Temp 97.9 F (36.6 C) (Oral)   Resp 19   Ht 5' 8.5" (1.74 m)   Wt 194 lb 0.1 oz (88 kg)   SpO2 97%   BMI 29.07 kg/m    Intake/Output Summary (Last 24 hours) at 10/01/2017 1726 Last data filed at 10/01/2017 1621 Gross per 24 hour  Intake 2515.26 ml  Output 2375 ml  Net 140.26 ml  CBG OK PM labs pending Bp remains problematic- will restart diltiazem, PRN hydralazine  Restart ARB tomorrow  Remo Lipps C. Roxan Hockey, MD [facility] [phone]

## 2017-10-01 NOTE — Progress Notes (Signed)
1 Day Post-Op Procedure(s) (LRB): CORONARY ARTERY BYPASS GRAFTING times five using right and left Saphaneous vein harvested endoscopicly  and left internal mammary artery. (CABG),TEE (N/A) TRANSESOPHAGEAL ECHOCARDIOGRAM (TEE) (N/A) Subjective: Some incisional pain, but back pain more bothersome  Objective: Vital signs in last 24 hours: Temp:  [98.1 F (36.7 C)-100.2 F (37.9 C)] 98.6 F (37 C) (12/22 0800) Pulse Rate:  [74-98] 98 (12/22 0800) Cardiac Rhythm: Normal sinus rhythm (12/22 0800) Resp:  [12-28] 28 (12/22 0800) BP: (93-146)/(58-89) 129/77 (12/22 0800) SpO2:  [96 %-100 %] 99 % (12/22 0800) Arterial Line BP: (115-187)/(51-74) 172/64 (12/22 0800) FiO2 (%):  [40 %-50 %] 40 % (12/21 1726) Weight:  [194 lb 0.1 oz (88 kg)] 194 lb 0.1 oz (88 kg) (12/22 0500)  Hemodynamic parameters for last 24 hours: PAP: (20-38)/(5-25) 31/15 CO:  [3.6 L/min-5.2 L/min] 5.2 L/min CI:  [1.8 L/min/m2-2.6 L/min/m2] 2.6 L/min/m2  Intake/Output from previous day: 12/21 0701 - 12/22 0700 In: 5208.3 [I.V.:4158.3; IV Piggyback:1050] Out: 8127 [Urine:2510; Blood:1110; Chest Tube:438] Intake/Output this shift: Total I/O In: 54 [I.V.:54] Out: 45 [Urine:45]  General appearance: alert, cooperative and no distress Neurologic: intact Heart: regular rate and rhythm Lungs: diminished breath sounds bibasilar Abdomen: normal findings: soft, non-tender  Lab Results: Recent Labs    09/30/17 2144 10/01/17 0312  WBC 12.7* 13.7*  HGB 7.9* 7.8*  HCT 25.5* 24.6*  PLT 208 201   BMET:  Recent Labs    09/30/17 1914 09/30/17 2144 10/01/17 0312  NA 143  --  137  K 4.4  --  4.3  CL 106  --  108  CO2  --   --  23  GLUCOSE 103*  --  109*  BUN 9  --  10  CREATININE 0.80 0.86 0.88  CALCIUM  --   --  8.3*    PT/INR:  Recent Labs    09/30/17 1306  LABPROT 15.9*  INR 1.28   ABG    Component Value Date/Time   PHART 7.333 (L) 09/30/2017 1911   HCO3 23.1 09/30/2017 1911   TCO2 24 09/30/2017 1914    ACIDBASEDEF 2.0 09/30/2017 1911   O2SAT 96.0 09/30/2017 1911   CBG (last 3)  Recent Labs    10/01/17 0103 10/01/17 0318 10/01/17 0744  GLUCAP 95 107* 114*    Assessment/Plan: S/P Procedure(s) (LRB): CORONARY ARTERY BYPASS GRAFTING times five using right and left Saphaneous vein harvested endoscopicly  and left internal mammary artery. (CABG),TEE (N/A) TRANSESOPHAGEAL ECHOCARDIOGRAM (TEE) (N/A) -POD # 1 doing well  CV- stable hemodynamics in SR- dc swan and A line  ECG- early repol in V2, 3   Hypertension- diurese, restart clonidine, beta blocker  RESP- IS for atelectasis  RENAL- creatinine OK, diurese  ENDO- CBG well controlled  Anemia secondary to ABL- follow  Dc chest tubes, mobilize   LOS: 1 day    Melrose Nakayama 10/01/2017

## 2017-10-01 NOTE — Plan of Care (Signed)
  Progressing Education: Knowledge of General Education information will improve 10/01/2017 2327 - Progressing by Reinaldo Berber, RN Health Behavior/Discharge Planning: Ability to manage health-related needs will improve 10/01/2017 2327 - Progressing by Reinaldo Berber, RN Nutrition: Adequate nutrition will be maintained 10/01/2017 2327 - Progressing by Reinaldo Berber, RN Note Pt with poor appetite tonight, will continue to encourage PO intake Coping: Level of anxiety will decrease 10/01/2017 2327 - Progressing by Reinaldo Berber, RN Pain Managment: General experience of comfort will improve 10/01/2017 2327 - Progressing by Reinaldo Berber, RN Safety: Ability to remain free from injury will improve 10/01/2017 2327 - Progressing by Reinaldo Berber, RN Education: Ability to demonstrate proper wound care will improve 10/01/2017 2327 - Progressing by Reinaldo Berber, RN Knowledge of disease or condition will improve 10/01/2017 2327 - Progressing by Reinaldo Berber, RN Knowledge of the prescribed therapeutic regimen will improve 10/01/2017 2327 - Progressing by Reinaldo Berber, RN Activity: Risk for activity intolerance will decrease 10/01/2017 2327 - Progressing by Reinaldo Berber, RN Note Pt ambulated tonight Cardiac: Hemodynamic stability will improve 10/01/2017 2327 - Progressing by Reinaldo Berber, RN Note BP better controlled this evening with restart of PO medications  Clinical Measurements: Postoperative complications will be avoided or minimized 10/01/2017 2327 - Progressing by Reinaldo Berber, RN Skin Integrity: Wound healing without signs and symptoms of infection 10/01/2017 2327 - Progressing by Reinaldo Berber, RN Risk for impaired skin integrity will decrease 10/01/2017 2327 - Progressing by Reinaldo Berber, RN Urinary Elimination: Ability to achieve and maintain adequate renal perfusion and functioning will improve 10/01/2017  2327 - Progressing by Reinaldo Berber, RN Note Pt still has Foley, diuresed well this evening

## 2017-10-02 ENCOUNTER — Inpatient Hospital Stay (HOSPITAL_COMMUNITY): Payer: Medicare Other

## 2017-10-02 LAB — BASIC METABOLIC PANEL
Anion gap: 5 (ref 5–15)
BUN: 13 mg/dL (ref 6–20)
CO2: 28 mmol/L (ref 22–32)
Calcium: 8.5 mg/dL — ABNORMAL LOW (ref 8.9–10.3)
Chloride: 102 mmol/L (ref 101–111)
Creatinine, Ser: 0.87 mg/dL (ref 0.61–1.24)
GFR calc Af Amer: >60 mL/min (ref >60)
GFR calc non Af Amer: >60 mL/min (ref >60)
Glucose, Bld: 117 mg/dL — ABNORMAL HIGH (ref 65–99)
Potassium: 3.9 mmol/L (ref 3.5–5.1)
Sodium: 135 mmol/L (ref 135–145)

## 2017-10-02 LAB — CBC
HCT: 24.2 % — ABNORMAL LOW (ref 39.0–52.0)
Hemoglobin: 7.4 g/dL — ABNORMAL LOW (ref 13.0–17.0)
MCH: 26.1 pg (ref 26.0–34.0)
MCHC: 30.6 g/dL (ref 30.0–36.0)
MCV: 85.2 fL (ref 78.0–100.0)
Platelets: 186 10*3/uL (ref 150–400)
RBC: 2.84 MIL/uL — ABNORMAL LOW (ref 4.22–5.81)
RDW: 17.7 % — ABNORMAL HIGH (ref 11.5–15.5)
WBC: 13.4 10*3/uL — ABNORMAL HIGH (ref 4.0–10.5)

## 2017-10-02 LAB — GLUCOSE, CAPILLARY
Glucose-Capillary: 114 mg/dL — ABNORMAL HIGH (ref 65–99)
Glucose-Capillary: 126 mg/dL — ABNORMAL HIGH (ref 65–99)
Glucose-Capillary: 130 mg/dL — ABNORMAL HIGH (ref 65–99)
Glucose-Capillary: 118 mg/dL — ABNORMAL HIGH (ref 65–99)
Glucose-Capillary: 130 mg/dL — ABNORMAL HIGH (ref 65–99)

## 2017-10-02 MED ORDER — SODIUM CHLORIDE 0.9 % IV SOLN: 250.0000 mL | INTRAVENOUS | Status: DC | PRN

## 2017-10-02 MED ORDER — FUROSEMIDE 40 MG PO TABS
40.0000 mg | ORAL_TABLET | Freq: Every day | ORAL | Status: DC
  Administered 2017-10-02 – 2017-10-05 (×4): 40 mg via ORAL
  Filled 2017-10-02 (×4): qty 1

## 2017-10-02 MED ORDER — ALUM & MAG HYDROXIDE-SIMETH 200-200-20 MG/5ML PO SUSP
15.0000 mL | Freq: Four times a day (QID) | ORAL | Status: DC | PRN
  Administered 2017-10-03: 15 mL via ORAL
  Filled 2017-10-02: qty 30

## 2017-10-02 MED ORDER — MAGNESIUM HYDROXIDE 400 MG/5ML PO SUSP
30.0000 mL | Freq: Every day | ORAL | Status: DC | PRN
  Administered 2017-10-03: 30 mL via ORAL
  Filled 2017-10-02: qty 30

## 2017-10-02 MED ORDER — INSULIN ASPART 100 UNIT/ML ~~LOC~~ SOLN
0.0000 [IU] | Freq: Three times a day (TID) | SUBCUTANEOUS | Status: DC
  Administered 2017-10-02 – 2017-10-04 (×3): 2 [IU] via SUBCUTANEOUS

## 2017-10-02 MED ORDER — MOVING RIGHT ALONG BOOK
Freq: Once | Status: AC
Start: 2017-10-02 — End: 2017-10-03
  Administered 2017-10-03: 10:00:00
  Filled 2017-10-02: qty 1

## 2017-10-02 MED ORDER — SODIUM CHLORIDE 0.9% FLUSH
3.0000 mL | Freq: Two times a day (BID) | INTRAVENOUS | Status: DC
  Administered 2017-10-03: 10 mL via INTRAVENOUS
  Administered 2017-10-03 – 2017-10-04 (×2): 3 mL via INTRAVENOUS

## 2017-10-02 MED ORDER — POTASSIUM CHLORIDE ER 10 MEQ PO TBCR
20.0000 meq | EXTENDED_RELEASE_TABLET | Freq: Every day | ORAL | Status: DC
  Administered 2017-10-02 – 2017-10-05 (×4): 20 meq via ORAL
  Filled 2017-10-02 (×6): qty 2

## 2017-10-02 MED ORDER — SODIUM CHLORIDE 0.9% FLUSH: 3.0000 mL | INTRAVENOUS | Status: DC | PRN

## 2017-10-02 NOTE — Progress Notes (Signed)
RT instructed patient on the use of flutter valve. Pt able to demonstrate back good technique. 

## 2017-10-02 NOTE — Progress Notes (Signed)
2 Days Post-Op Procedure(s) (LRB): CORONARY ARTERY BYPASS GRAFTING times five using right and left Saphaneous vein harvested endoscopicly  and left internal mammary artery. (CABG),TEE (N/A) TRANSESOPHAGEAL ECHOCARDIOGRAM (TEE) (N/A) Subjective: Feels better this AM  Objective: Vital signs in last 24 hours: Temp:  [97.9 F (36.6 C)-99.2 F (37.3 C)] 99.2 F (37.3 C) (12/23 0800) Pulse Rate:  [89-110] 93 (12/23 0700) Cardiac Rhythm: Normal sinus rhythm (12/23 0800) Resp:  [9-34] 15 (12/23 0700) BP: (127-197)/(61-123) 131/61 (12/23 0700) SpO2:  [90 %-100 %] 97 % (12/23 0700) Arterial Line BP: (156-193)/(60-71) 156/61 (12/22 1500) Weight:  [189 lb 13.1 oz (86.1 kg)] 189 lb 13.1 oz (86.1 kg) (12/23 0600)  Hemodynamic parameters for last 24 hours: PAP: (37)/(15) 37/15  Intake/Output from previous day: 12/22 0701 - 12/23 0700 In: 1322 [P.O.:480; I.V.:742; IV Piggyback:100] Out: 2485 [Urine:2395; Chest Tube:90] Intake/Output this shift: Total I/O In: 20 [I.V.:20] Out: 65 [Urine:65]  General appearance: alert, cooperative and no distress Neurologic: intact Heart: regular rate and rhythm Lungs: diminished breath sounds bibasilar Abdomen: mildly distended, nontender, +BS  Lab Results: Recent Labs    10/01/17 1713 10/01/17 1731 10/02/17 0448  WBC 13.0*  --  13.4*  HGB 7.7* 8.8* 7.4*  HCT 25.7* 26.0* 24.2*  PLT 217  --  186   BMET:  Recent Labs    10/01/17 0312  10/01/17 1731 10/02/17 0448  NA 137  --  138 135  K 4.3  --  4.2 3.9  CL 108  --  100* 102  CO2 23  --   --  28  GLUCOSE 109*  --  145* 117*  BUN 10  --  13 13  CREATININE 0.88   < > 0.80 0.87  CALCIUM 8.3*  --   --  8.5*   < > = values in this interval not displayed.    PT/INR:  Recent Labs    09/30/17 1306  LABPROT 15.9*  INR 1.28   ABG    Component Value Date/Time   PHART 7.333 (L) 09/30/2017 1911   HCO3 23.1 09/30/2017 1911   TCO2 25 10/01/2017 1731   ACIDBASEDEF 2.0 09/30/2017 1911   O2SAT 96.0 09/30/2017 1911   CBG (last 3)  Recent Labs    10/01/17 2332 10/02/17 0258 10/02/17 0756  GLUCAP 117* 114* 126*    Assessment/Plan: S/P Procedure(s) (LRB): CORONARY ARTERY BYPASS GRAFTING times five using right and left Saphaneous vein harvested endoscopicly  and left internal mammary artery. (CABG),TEE (N/A) TRANSESOPHAGEAL ECHOCARDIOGRAM (TEE) (N/A) Plan for transfer to step-down: see transfer orders  POD # 2 doing well CV- in SR  Hypertension- better control with multiple meds  RESP_ IS for atelectasis, add flutter valve  RENAL- creatinine OK, PO lasix  ENDO- CBG OK- change to AC/HS  Anemia secondary to ABL- tolerating, follow  Continue cardiac rehab   LOS: 2 days    Craig [name] 10/02/2017

## 2017-10-02 NOTE — Plan of Care (Signed)
  Progressing Education: Knowledge of General Education information will improve 10/02/2017 2049 - Progressing by Reinaldo Berber, RN Health Behavior/Discharge Planning: Ability to manage health-related needs will improve 10/02/2017 2049 - Progressing by Reinaldo Berber, RN Nutrition: Adequate nutrition will be maintained 10/02/2017 2049 - Progressing by Reinaldo Berber, RN Note Patient ate better today Pain Managment: General experience of comfort will improve 10/02/2017 2049 - Progressing by Reinaldo Berber, RN Safety: Ability to remain free from injury will improve 10/02/2017 2049 - Progressing by Reinaldo Berber, RN Education: Ability to demonstrate proper wound care will improve 10/02/2017 2049 - Progressing by Reinaldo Berber, RN Knowledge of disease or condition will improve 10/02/2017 2049 - Progressing by Reinaldo Berber, RN Knowledge of the prescribed therapeutic regimen will improve 10/02/2017 2049 - Progressing by Reinaldo Berber, RN Activity: Risk for activity intolerance will decrease 10/02/2017 2049 - Progressing by Reinaldo Berber, RN Note Pt ambulated 3 times today Skin Integrity: Wound healing without signs and symptoms of infection 10/02/2017 2049 - Progressing by Reinaldo Berber, RN Risk for impaired skin integrity will decrease 10/02/2017 2049 - Progressing by Reinaldo Berber, RN

## 2017-10-02 NOTE — Progress Notes (Signed)
      [address]       [place] [address]             [phone]      Some incisional pain today  BP (!) 165/71   Pulse 83   Temp 99.7 F (37.6 C) (Oral)   Resp 15   Ht 5' 8.5" (1.74 m)   Wt 189 lb 13.1 oz (86.1 kg)   SpO2 97%   BMI 28.44 kg/m   Intake/Output Summary (Last 24 hours) at 10/02/2017 1737 Last data filed at 10/02/2017 0800 Gross per 24 hour  Intake 359 ml  Output 1255 ml  Net -896 ml   Continue present care  Remo Lipps C. Roxan Hockey, MD [facility] [phone]

## 2017-10-03 ENCOUNTER — Encounter (HOSPITAL_COMMUNITY): Payer: Self-pay | Admitting: Thoracic Surgery (Cardiothoracic Vascular Surgery)

## 2017-10-03 LAB — CBC
HCT: 24.8 % — ABNORMAL LOW (ref 39.0–52.0)
Hemoglobin: 7.6 g/dL — ABNORMAL LOW (ref 13.0–17.0)
MCH: 26 pg (ref 26.0–34.0)
MCHC: 30.6 g/dL (ref 30.0–36.0)
MCV: 84.9 fL (ref 78.0–100.0)
Platelets: 216 10*3/uL (ref 150–400)
RBC: 2.92 MIL/uL — ABNORMAL LOW (ref 4.22–5.81)
RDW: 17.8 % — ABNORMAL HIGH (ref 11.5–15.5)
WBC: 12.3 10*3/uL — ABNORMAL HIGH (ref 4.0–10.5)

## 2017-10-03 LAB — BASIC METABOLIC PANEL
Anion gap: 7 (ref 5–15)
BUN: 17 mg/dL (ref 6–20)
CO2: 27 mmol/L (ref 22–32)
Calcium: 9 mg/dL (ref 8.9–10.3)
Chloride: 101 mmol/L (ref 101–111)
Creatinine, Ser: 1 mg/dL (ref 0.61–1.24)
GFR calc Af Amer: >60 mL/min (ref >60)
GFR calc non Af Amer: >60 mL/min (ref >60)
Glucose, Bld: 117 mg/dL — ABNORMAL HIGH (ref 65–99)
Potassium: 3.8 mmol/L (ref 3.5–5.1)
Sodium: 135 mmol/L (ref 135–145)

## 2017-10-03 LAB — GLUCOSE, CAPILLARY
Glucose-Capillary: 140 mg/dL — ABNORMAL HIGH (ref 65–99)
Glucose-Capillary: 80 mg/dL (ref 65–99)
Glucose-Capillary: 133 mg/dL — ABNORMAL HIGH (ref 65–99)

## 2017-10-03 MED ORDER — POLYETHYLENE GLYCOL 3350 17 G PO PACK: 17.0000 g | PACK | Freq: Every day | ORAL | Status: DC | PRN

## 2017-10-03 MED FILL — Sodium Bicarbonate IV Soln 8.4%: INTRAVENOUS | Qty: 50 | Status: AC

## 2017-10-03 MED FILL — Mannitol IV Soln 20%: INTRAVENOUS | Qty: 500 | Status: AC

## 2017-10-03 MED FILL — Lidocaine HCl IV Inj 20 MG/ML: INTRAVENOUS | Qty: 5 | Status: AC

## 2017-10-03 MED FILL — Heparin Sodium (Porcine) Inj 1000 Unit/ML: INTRAMUSCULAR | Qty: 10 | Status: AC

## 2017-10-03 MED FILL — Sodium Chloride IV Soln 0.9%: INTRAVENOUS | Qty: 2000 | Status: AC

## 2017-10-03 MED FILL — Electrolyte-R (PH 7.4) Solution: INTRAVENOUS | Qty: 4000 | Status: AC

## 2017-10-03 NOTE — Plan of Care (Signed)
  Progressing Education: Knowledge of General Education information will improve 10/03/2017 2031 - Progressing by Reinaldo Berber, RN Health Behavior/Discharge Planning: Ability to manage health-related needs will improve 10/03/2017 2031 - Progressing by Reinaldo Berber, RN Nutrition: Adequate nutrition will be maintained 10/03/2017 2031 - Progressing by Reinaldo Berber, RN Pain Managment: General experience of comfort will improve 10/03/2017 2031 - Progressing by Reinaldo Berber, RN Safety: Ability to remain free from injury will improve 10/03/2017 2031 - Progressing by Reinaldo Berber, RN Education: Ability to demonstrate proper wound care will improve 10/03/2017 2031 - Progressing by Reinaldo Berber, RN Knowledge of disease or condition will improve 10/03/2017 2031 - Progressing by Reinaldo Berber, RN Knowledge of the prescribed therapeutic regimen will improve 10/03/2017 2031 - Progressing by Reinaldo Berber, RN Activity: Risk for activity intolerance will decrease 10/03/2017 2031 - Progressing by Reinaldo Berber, RN Clinical Measurements: Postoperative complications will be avoided or minimized 10/03/2017 2031 - Progressing by Reinaldo Berber, RN Skin Integrity: Wound healing without signs and symptoms of infection 10/03/2017 2031 - Progressing by Reinaldo Berber, RN Risk for impaired skin integrity will decrease 10/03/2017 2031 - Progressing by Reinaldo Berber, RN

## 2017-10-03 NOTE — Progress Notes (Signed)
3 Days Post-Op Procedure(s) (LRB): CORONARY ARTERY BYPASS GRAFTING times five using right and left Saphaneous vein harvested endoscopicly  and left internal mammary artery. (CABG),TEE (N/A) TRANSESOPHAGEAL ECHOCARDIOGRAM (TEE) (N/A) Subjective: Walked 2 laps this AM  Objective: Vital signs in last 24 hours: Temp:  [97.9 F (36.6 C)-99.7 F (37.6 C)] 98.7 F (37.1 C) (12/24 0752) Pulse Rate:  [83-96] 96 (12/24 0600) Cardiac Rhythm: Normal sinus rhythm (12/23 2000) Resp:  [12-26] 26 (12/24 0600) BP: (107-165)/(36-99) 119/99 (12/24 0600) SpO2:  [89 %-100 %] 89 % (12/24 0600) Weight:  [189 lb 9.5 oz (86 kg)] 189 lb 9.5 oz (86 kg) (12/24 0600)  Hemodynamic parameters for last 24 hours:    Intake/Output from previous day: 12/23 0701 - 12/24 0700 In: 1020 [P.O.:960; I.V.:60] Out: 950 [Urine:950] Intake/Output this shift: No intake/output data recorded.  General appearance: alert, cooperative and no distress Neurologic: intact Heart: regular rate and rhythm Lungs: diminished breath sounds bibasilar  Lab Results: Recent Labs    10/02/17 0448 10/03/17 0231  WBC 13.4* 12.3*  HGB 7.4* 7.6*  HCT 24.2* 24.8*  PLT 186 216   BMET:  Recent Labs    10/02/17 0448 10/03/17 0231  NA 135 135  K 3.9 3.8  CL 102 101  CO2 28 27  GLUCOSE 117* 117*  BUN 13 17  CREATININE 0.87 1.00  CALCIUM 8.5* 9.0    PT/INR:  Recent Labs    09/30/17 1306  LABPROT 15.9*  INR 1.28   ABG    Component Value Date/Time   PHART 7.333 (L) 09/30/2017 1911   HCO3 23.1 09/30/2017 1911   TCO2 25 10/01/2017 1731   ACIDBASEDEF 2.0 09/30/2017 1911   O2SAT 96.0 09/30/2017 1911   CBG (last 3)  Recent Labs    10/02/17 1122 10/02/17 1531 10/02/17 2103  GLUCAP 130* 118* 130*    Assessment/Plan: S/P Procedure(s) (LRB): CORONARY ARTERY BYPASS GRAFTING times five using right and left Saphaneous vein harvested endoscopicly  and left internal mammary artery. (CABG),TEE (N/A) TRANSESOPHAGEAL  ECHOCARDIOGRAM (TEE) (N/A) Plan for transfer to step-down: see transfer orders  CV- stable, BP control is better RESP- continue IS and flutter for atelectasis RENAL- creatinine normal, lytes OK, continue diuresis ENDO- CBG well controlled Anemia secondary to ABL- tolerating well   LOS: 3 days    Melrose Nakayama 10/03/2017

## 2017-10-03 NOTE — Progress Notes (Signed)
CT surgery p.m. Rounds  Patient examined and record reviewed.Hemodynamics stable,labs satisfactory.Patient had stable day.Continue current care. Plan transfer to stepdown tomorrow Tharon Aquas Trigt III 10/03/2017

## 2017-10-04 LAB — GLUCOSE, CAPILLARY
Glucose-Capillary: 121 mg/dL — ABNORMAL HIGH (ref 65–99)
Glucose-Capillary: 108 mg/dL — ABNORMAL HIGH (ref 65–99)
Glucose-Capillary: 102 mg/dL — ABNORMAL HIGH (ref 65–99)
Glucose-Capillary: 122 mg/dL — ABNORMAL HIGH (ref 65–99)

## 2017-10-04 NOTE — Progress Notes (Signed)
4 Days Post-Op Procedure(s) (LRB): CORONARY ARTERY BYPASS GRAFTING times five using right and left Saphaneous vein harvested endoscopicly  and left internal mammary artery. (CABG),TEE (N/A) TRANSESOPHAGEAL ECHOCARDIOGRAM (TEE) (N/A) Subjective: Stable after CABG nsr Postop blood loss anemia on po Fe Objective: Vital signs in last 24 hours: Temp:  [98.5 F (36.9 C)-100 F (37.8 C)] 98.5 F (36.9 C) (12/25 0839) Pulse Rate:  [89-100] 89 (12/25 0300) Cardiac Rhythm: Sinus tachycardia (12/24 2000) Resp:  [13-28] 28 (12/24 2300) BP: (116-155)/(59-67) 131/63 (12/25 0300) SpO2:  [92 %-97 %] 95 % (12/25 0300) Weight:  [189 lb 9.5 oz (86 kg)] 189 lb 9.5 oz (86 kg) (12/25 0500)  Hemodynamic parameters for last 24 hours:  stable  Intake/Output from previous day: 12/24 0701 - 12/25 0700 In: 780 [P.O.:780] Out: 1550 [Urine:1550] Intake/Output this shift: No intake/output data recorded.       Exam    General- alert and comfortable   Lungs- clear without rales, wheezes   Cor- regular rate and rhythm, no murmur , gallop   Abdomen- soft, non-tender   Extremities - warm, non-tender, minimal edema   Neuro- oriented, appropriate, no focal weakness   Lab Results: Recent Labs    10/02/17 0448 10/03/17 0231  WBC 13.4* 12.3*  HGB 7.4* 7.6*  HCT 24.2* 24.8*  PLT 186 216   BMET:  Recent Labs    10/02/17 0448 10/03/17 0231  NA 135 135  K 3.9 3.8  CL 102 101  CO2 28 27  GLUCOSE 117* 117*  BUN 13 17  CREATININE 0.87 1.00  CALCIUM 8.5* 9.0    PT/INR: No results for input(s): LABPROT, INR in the last 72 hours. ABG    Component Value Date/Time   PHART 7.333 (L) 09/30/2017 1911   HCO3 23.1 09/30/2017 1911   TCO2 25 10/01/2017 1731   ACIDBASEDEF 2.0 09/30/2017 1911   O2SAT 96.0 09/30/2017 1911   CBG (last 3)  Recent Labs    10/03/17 0751 10/03/17 1114 10/03/17 1615  GLUCAP 140* 80 133*    Assessment/Plan: S/P Procedure(s) (LRB): CORONARY ARTERY BYPASS GRAFTING  times five using right and left Saphaneous vein harvested endoscopicly  and left internal mammary artery. (CABG),TEE (N/A) TRANSESOPHAGEAL ECHOCARDIOGRAM (TEE) (N/A) DC EPWs  prob DC home tomorrow   LOS: 4 days    Craig [name] 10/04/2017

## 2017-10-05 ENCOUNTER — Inpatient Hospital Stay (HOSPITAL_COMMUNITY): Payer: Medicare Other

## 2017-10-05 LAB — CBC
HCT: 22.4 % — ABNORMAL LOW (ref 39.0–52.0)
Hemoglobin: 7 g/dL — ABNORMAL LOW (ref 13.0–17.0)
MCH: 25.9 pg — ABNORMAL LOW (ref 26.0–34.0)
MCHC: 31.3 g/dL (ref 30.0–36.0)
MCV: 83 fL (ref 78.0–100.0)
Platelets: 247 10*3/uL (ref 150–400)
RBC: 2.7 MIL/uL — ABNORMAL LOW (ref 4.22–5.81)
RDW: 17.9 % — ABNORMAL HIGH (ref 11.5–15.5)
WBC: 8.5 10*3/uL (ref 4.0–10.5)

## 2017-10-05 LAB — BASIC METABOLIC PANEL
Anion gap: 7 (ref 5–15)
BUN: 13 mg/dL (ref 6–20)
CO2: 22 mmol/L (ref 22–32)
Calcium: 8.4 mg/dL — ABNORMAL LOW (ref 8.9–10.3)
Chloride: 108 mmol/L (ref 101–111)
Creatinine, Ser: 0.89 mg/dL (ref 0.61–1.24)
GFR calc Af Amer: >60 mL/min (ref >60)
GFR calc non Af Amer: >60 mL/min (ref >60)
Glucose, Bld: 106 mg/dL — ABNORMAL HIGH (ref 65–99)
Potassium: 3.9 mmol/L (ref 3.5–5.1)
Sodium: 137 mmol/L (ref 135–145)

## 2017-10-05 LAB — GLUCOSE, CAPILLARY
Glucose-Capillary: 98 mg/dL (ref 65–99)
Glucose-Capillary: 143 mg/dL — ABNORMAL HIGH (ref 65–99)
Glucose-Capillary: 112 mg/dL — ABNORMAL HIGH (ref 65–99)
Glucose-Capillary: 110 mg/dL — ABNORMAL HIGH (ref 65–99)

## 2017-10-05 LAB — POCT I-STAT 3, ART BLOOD GAS (G3+)
Bicarbonate: 25.7 mmol/L (ref 20.0–28.0)
O2 Saturation: 98 %
Patient temperature: 36.3
TCO2: 27 mmol/L (ref 22–32)
pCO2 arterial: 45 mmHg (ref 32.0–48.0)
pH, Arterial: 7.362 (ref 7.350–7.450)
pO2, Arterial: 113 mmHg — ABNORMAL HIGH (ref 83.0–108.0)

## 2017-10-05 MED ORDER — POTASSIUM CHLORIDE ER 20 MEQ PO TBCR: 20.0000 meq | EXTENDED_RELEASE_TABLET | Freq: Every day | ORAL | 0 refills | Status: DC

## 2017-10-05 MED ORDER — ACETAMINOPHEN 500 MG PO TABS: 1000.0000 mg | ORAL_TABLET | Freq: Four times a day (QID) | ORAL | 0 refills | Status: DC

## 2017-10-05 MED ORDER — ASPIRIN 325 MG PO TBEC: 325.0000 mg | DELAYED_RELEASE_TABLET | Freq: Every day | ORAL | 0 refills | Status: DC

## 2017-10-05 MED ORDER — CLONIDINE HCL 0.1 MG PO TABS: 0.1000 mg | ORAL_TABLET | Freq: Three times a day (TID) | ORAL | 1 refills | Status: DC

## 2017-10-05 MED ORDER — METOPROLOL TARTRATE 25 MG PO TABS: 25.0000 mg | ORAL_TABLET | Freq: Two times a day (BID) | ORAL | 1 refills | Status: DC

## 2017-10-05 MED ORDER — FUROSEMIDE 40 MG PO TABS: 40.0000 mg | ORAL_TABLET | Freq: Every day | ORAL | 0 refills | Status: DC

## 2017-10-05 MED ORDER — OXYCODONE HCL 5 MG PO TABS: 5.0000 mg | ORAL_TABLET | ORAL | 0 refills | Status: DC | PRN

## 2017-10-05 NOTE — Care Management Note (Signed)
Case Management Note  Patient Details  Name: Craig [name] MRN: [id number] Date of Birth: [birth year]  Subjective/Objective:    Pt s/p CABG. He is from home with his spouse.               Action/Plan: Pt discharging home with self care. Pt has insurance, PCP, hospital f/u and transportation home. No further needs per CM.   Expected Discharge Date:  10/05/17               Expected Discharge Plan:  Home/Self Care  In-House Referral:     Discharge planning Services     Post Acute Care Choice:    Choice offered to:     DME Arranged:    DME Agency:     HH Arranged:    HH Agency:     Status of Service:  Completed, signed off  If discussed at [employer] of Stay Meetings, dates discussed:    Additional Comments:  Pollie Friar, RN 10/05/2017, 11:00 AM

## 2017-10-05 NOTE — Care Management Important Message (Signed)
Important Message  Patient Details  Name: Craig [name] MRN: [id number] Date of Birth: 1946/08/24   Medicare Important Message Given:  Yes    [name] 10/05/2017, 2:26 PM

## 2017-10-05 NOTE — Discharge Instructions (Signed)

## 2017-10-05 NOTE — Progress Notes (Addendum)
      [address]       [place] [address]             [phone]      5 Days Post-Op Procedure(s) (LRB): CORONARY ARTERY BYPASS GRAFTING times five using right and left Saphaneous vein harvested endoscopicly  and left internal mammary artery. (CABG),TEE (N/A) TRANSESOPHAGEAL ECHOCARDIOGRAM (TEE) (N/A) Subjective: Had a good night. Wants to go home today. Plans on going for a walk in the halls.   Objective: Vital signs in last 24 hours: Temp:  [98 F (36.7 C)-100.9 F (38.3 C)] 99.7 F (37.6 C) (12/26 0442) Pulse Rate:  [92] 92 (12/25 1234) Cardiac Rhythm: Normal sinus rhythm (12/26 0701) Resp:  [16-25] 25 (12/25 1130) BP: (122-160)/(56-96) 125/56 (12/26 0442) SpO2:  [92 %-95 %] 95 % (12/25 1100) Weight:  [187 lb 6.3 oz (85 kg)] 187 lb 6.3 oz (85 kg) (12/26 0442)     Intake/Output from previous day: 12/25 0701 - 12/26 0700 In: 236 [P.O.:236] Out: -  Intake/Output this shift: No intake/output data recorded.  General appearance: alert, cooperative and no distress Heart: regular rate and rhythm, S1, S2 normal, no murmur, click, rub or gallop Lungs: clear to auscultation bilaterally Abdomen: soft, non-tender; bowel sounds normal; no masses,  no organomegaly Extremities: extremities normal, atraumatic, no cyanosis or edema Wound: clean and dry  Lab Results: Recent Labs    10/03/17 0231 10/05/17 0228  WBC 12.3* 8.5  HGB 7.6* 7.0*  HCT 24.8* 22.4*  PLT 216 247   BMET:  Recent Labs    10/03/17 0231 10/05/17 0228  NA 135 137  K 3.8 3.9  CL 101 108  CO2 27 22  GLUCOSE 117* 106*  BUN 17 13  CREATININE 1.00 0.89  CALCIUM 9.0 8.4*    PT/INR: No results for input(s): LABPROT, INR in the last 72 hours. ABG    Component Value Date/Time   PHART 7.333 (L) 09/30/2017 1911   HCO3 23.1 09/30/2017 1911   TCO2 25 10/01/2017 1731   ACIDBASEDEF 2.0 09/30/2017 1911   O2SAT 96.0 09/30/2017 1911   CBG (last 3)  Recent Labs    10/04/17 1713  10/04/17 2033 10/05/17 0603  GLUCAP 102* 122* 112*    Assessment/Plan: S/P Procedure(s) (LRB): CORONARY ARTERY BYPASS GRAFTING times five using right and left Saphaneous vein harvested endoscopicly  and left internal mammary artery. (CABG),TEE (N/A) TRANSESOPHAGEAL ECHOCARDIOGRAM (TEE) (N/A)  1. CV-remains in NSR in the 90s. BP well controlled. Patient is on Avapro 300mg , Lopressor 25mg , Cardizem 300mg , and Clonidine 0.1mg . EPW out.  2. Pulm-tolerating room air with okay oxygenation. 3. Renal-creatinine 0.89, electrolytes okay. 4. H and H- 7.0/22.4, dropped slightly from 7.6/24.8 5. Thrombocytopenia-increased to 247k 6. Endo-blood glucose level well controlled. On SSI coverage. A1C 5.6  Plan: Home later today.    LOS: 5 days    [name] 10/05/2017   Stable today, ambulating well  Home today  I have seen and examined Craig [name] and agree with the above assessment  and plan.  [name] MD Beeper [phone] Office [phone] 10/05/2017 9:41 AM

## 2017-10-05 NOTE — Discharge Summary (Signed)
Physician Discharge Summary  Patient ID: Craig [name] MRN: [id number] DOB/AGE: 1946-02-16 71 y.o.  Admit date: 09/30/2017 Discharge date: 10/05/2017  Admission Diagnoses: Patient Active Problem List   Diagnosis Date Noted  . Anemia   . Coronary artery disease involving native coronary artery of native heart with angina pectoris ([place])   . Tobacco abuse   . Unstable angina ([place]) 09/06/2017  . Stable angina ([place]) 08/29/2017  . Spondylolisthesis, lumbar region 03/16/2017  . HLD (hyperlipidemia) 09/10/2015  . 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 Essential (primary) hypertension 09/10/2015  . Degeneration of cervical intervertebral disc 09/10/2015  . Allergic rhinitis 09/10/2015  . Hyperlipidemia 06/25/2015  . GERD (gastroesophageal reflux disease) 06/25/2015  . Cervical stenosis of spinal canal 06/25/2015  . Peripheral vascular disease ([place]) 06/25/2015    Discharge Diagnoses:  Principal Problem:   S/P CABG x 5 Active Problems:   Essential (primary) hypertension   Stable angina (HCC)   Unstable angina (HCC)   Coronary artery disease involving native coronary artery of native heart with angina pectoris ([place])   Anemia   Discharged Condition: good  HPI: Patient is a 71 year old African-American male with no previous history of coronary artery disease but risk factors notable for history of hypertension, hyperlipidemia, and long-standing tobacco abuse who has been referred for surgical consultation to discuss treatment options for management of severe left main and three-vessel coronary artery disease with stable angina pectoris.  The patient describes recent onset burning substernal chest discomfort that occurs only when he is walking or doing something more strenuous.  He underwent a stress echo that was reportedly abnormal and subsequently underwent diagnostic cardiac catheterization by Dr. Nehemiah Massed on September 06, 2017.  He was found to have left main and three-vessel  coronary artery disease with preserved left ventricular systolic function.  Elective cardiothoracic surgical consultation was requested.  The patient is married and lives locally in [place] with his wife.  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  He initially quit working because of problems with his lower back.  He underwent back surgery last June and has recovered reasonably well.  He remains fully ambulatory.  He still has some pain in his lower back.  He reports that he is limited by pain in both calf muscles with ambulation consistent with likely claudication.  Symptoms are always relieved by rest.  Over the last few weeks he has episodes of burning substernal chest pain associated with shortness of breath with it only occur when he is walking or doing something strenuous.  Symptoms are usually promptly relieved by rest.  He denies any nocturnal angina.  He reports no significant exertional shortness of breath, PND, orthopnea, or lower extremity edema   Hospital Course:  On 09/30/2017 [name] underwent a coronary artery bypass grafting x5 with Dr. Roxy Manns.  He tolerated the procedure well and was transferred to the surgical.  He was extubated in a timely manner.  Postop day 1 he was having some incisional pain but his back pain is more bothersome.  He remained hemodynamically stable in normal sinus rhythm.  We discontinued Swan-Ganz catheter and his arterial line.  We restarted his clonidine for hypertension and initiated a diuretic regimen for fluid overload.  We continue to encourage incentive spirometry for postoperative atelectasis.  We discontinued his chest tubes.  Postop day 2 he continues to progress.  We restarted multiple home hypertensive medications.  His renal function remained stable.  Attempted to transfer  to the telemetry unit at this time.  He continued to ambulate independently in the ICU.  His blood pressure was much better controlled.  Again,  transfer orders placed for transfer to telemetry unit.  His rhythm remains stable, therefore we removed his epicardial pacing wires.  On the telemetry floor, he continued to walk independently in the halls without issue.  He did have some thrombocytopenia which was improving.  His H&H did drop slightly and today is 7.0/22.4.  He has a history of anemia.  It has been stable over the last several days.  Today he is ambulating independently, his incisions are healing well, he is off supplemental oxygen, and he is ready for discharge home.  Consults: None  Significant Diagnostic Studies: CLINICAL DATA:  Status post coronary artery bypass grafting. Atelectasis.  EXAM: PORTABLE CHEST 1 VIEW  COMPARISON:  October 02, 2017  FINDINGS: Cordis is no longer appreciable. No pneumothorax. There is atelectatic change in the left mid lung and left base, less than on most recent study. Lungs elsewhere clear. Heart remains mildly enlarged with pulmonary vascularity within normal limits. There is mild aortic prominence with aortic atherosclerosis, stable. Patient is status post coronary artery bypass grafting. No evident adenopathy. No bone lesions.  IMPRESSION: Less atelectasis on the left compared to most recent study. No new opacity elsewhere. Stable cardiac prominence. There is aortic atherosclerosis. No pneumothorax.  Aortic Atherosclerosis (ICD10-I70.0).   Electronically Signed   By: Lowella Grip III M.D.   On: 10/05/2017 07:38   Treatments: CARDIOTHORACIC SURGERY OPERATIVE NOTE  Date of Procedure:    09/30/2017  Preoperative Diagnosis:        Severe 3-vessel Coronary Artery Disease  Stable Angina Pectoris  Postoperative Diagnosis:    Same  Procedure:        Coronary Artery Bypass Grafting x 5                       Left Internal Mammary Artery to Distal Left Anterior Descending Coronary Artery             Saphenous Vein Graft to Acute Marginal Branch of  Right Coronary Artery             Saphenous Vein Graft to First Obtuse Marginal Branch of Left Circumflex Coronary Artery             Sequential Saphenous Vein Graft to Second Obtuse Marginal Branch of Left Circumflex Coronary Artery                Saphenous Vein Graft to Distal Left Circumflex Coronary Artery             Endoscopic Vein Harvest from Bilateral Thighs  Surgeon:        Valentina Gu. Roxy Manns, MD  Assistant:       Nani Skillern, PA-C  Anesthesia:    Nelda Severe. Tobias Alexander, MD  Operative Findings: ? Mild left ventricular systolic dysfunction ? Good quality left internal mammary artery conduit ? Good quality saphenous vein conduit ? Good quality target vessels for grafting ? Moderate atherosclerotic plaque involving the ascending aorta    Discharge Exam: Blood pressure (!) 125/56, pulse 92, temperature 99.7 F (37.6 C), temperature source Oral, resp. rate (!) 25, height 5' 8.5" (1.74 m), weight 187 lb 6.3 oz (85 kg), SpO2 95 %.   General appearance: alert, cooperative and no distress Heart: regular rate and rhythm, S1, S2 normal, no murmur, click, rub or gallop Lungs: clear  to auscultation bilaterally Abdomen: soft, non-tender; bowel sounds normal; no masses,  no organomegaly Extremities: extremities normal, atraumatic, no cyanosis or edema Wound: clean and dry     Disposition: 01-Home or Self Care  Discharge Instructions    Discharge patient   Complete by:  As directed    Discharge disposition:  01-Home or Self Care   Discharge patient date:  10/05/2017     Allergies as of 10/05/2017      Reactions   Lipitor [atorvastatin] Other (See Comments)   MYALGIAS   Lisinopril Cough   Zetia [ezetimibe] Other (See Comments)   MYALGIAS   Protonix [pantoprazole] Other (See Comments)   headache      Medication List    STOP taking these medications   aspirin 81 MG tablet Replaced by:  aspirin 325 MG EC tablet   ibuprofen 200 MG tablet Commonly known as:   ADVIL,MOTRIN   losartan 100 MG tablet Commonly known as:  COZAAR   traMADol 50 MG tablet Commonly known as:  ULTRAM     TAKE these medications   acetaminophen 500 MG tablet Commonly known as:  TYLENOL Take 2 tablets (1,000 mg total) by mouth every 6 (six) hours.   aspirin 325 MG EC tablet Take 1 tablet (325 mg total) by mouth daily. Replaces:  aspirin 81 MG tablet   cloNIDine 0.1 MG tablet Commonly known as:  CATAPRES Take 1 tablet (0.1 mg total) by mouth 3 (three) times daily.   cyclobenzaprine 10 MG tablet Commonly known as:  FLEXERIL TAKE 10 MG BY ORAL ROUTE 3 TIMES EVERY DAY AS NEEDED FOR MUSCLE SPASMS   diltiazem 300 MG 24 hr capsule Commonly known as:  CARDIZEM CD TAKE 1 CAPSULE (300 MG TOTAL) BY MOUTH DAILY.   furosemide 40 MG tablet Commonly known as:  LASIX Take 1 tablet (40 mg total) by mouth daily for 3 days.   gabapentin 300 MG capsule Commonly known as:  NEURONTIN TAKE 2 CAPSULES BY MOUTH 3 TIMES A DAY What changed:    how much to take  how to take this  when to take this  additional instructions   metoprolol tartrate 25 MG tablet Commonly known as:  LOPRESSOR Take 1 tablet (25 mg total) by mouth 2 (two) times daily.   multivitamin with minerals Tabs tablet Take 1 tablet by mouth daily.   OMEGA-3 FATTY ACIDS PO Take 1 capsule daily by mouth.   omeprazole 40 MG capsule Commonly known as:  PRILOSEC Take 1 capsule (40 mg total) by mouth daily.   oxyCODONE 5 MG immediate release tablet Commonly known as:  Oxy IR/ROXICODONE Take 1 tablet (5 mg total) by mouth every 4 (four) hours as needed for severe pain.   Potassium Chloride ER 20 MEQ Tbcr Take 20 mEq by mouth daily for 3 days.   pravastatin 40 MG tablet Commonly known as:  PRAVACHOL Take 1 tablet (40 mg total) by mouth daily. What changed:  how much to take   telmisartan 80 MG tablet Commonly known as:  MICARDIS Take 80 mg by mouth every morning.   triamcinolone cream 0.1  % Commonly known as:  KENALOG Apply 1 application topically 2 (two) times daily as needed (rash on ear).      Follow-up Information    Corey Skains, MD. Call in 1 day(s).   Specialty:  Cardiology Why:  Please call today to make an appointment in 4 weeks. Contact information: [facility] [place] [address]  [place]. Call in 1 day(s).   Specialty:  Family Medicine Contact information: [address] [place] [address] [phone]        Rexene Alberts, MD Follow up.   Specialty:  Cardiothoracic Surgery Why:  Your appointment is on 11/14/2017 at 10:00am. Please arrive at 9:30am for a chest xray located at [facility] which is on the first floor of our building.  Contact information: [place] [address] [phone]          The patient has been discharged on:   1.Beta Blocker:  Yes [ x  ]                              No   [   ]                              If No, reason:  2.Ace Inhibitor/ARB: Yes [ x  ]                                     No  [    ]                                     If No, reason:  3.Statin:   Yes [ x  ]                  No  [   ]                  If No, reason:  4.Ecasa:  Yes  [ x  ]                  No   [   ]                  If No, reason:   Signed: [name] 10/05/2017, 9:38 AM

## 2017-10-05 NOTE — Progress Notes (Signed)
1200-1300 Cardiac Rehab Completed discharge education with pt and wife.Discussed exercise guidelines, heart healthy diet, use of IS, smoking cessation, drinking alcohol  and Outpt. CRP. They voice understanding.Pt states that he would like to quit smoking but he is not going to make any promises. I gave him quit smoking information and a fake cigarette. I strongly encouraged him to try. I will send a letter of interest to [place]. CRP about Craig [name].

## 2017-10-06 LAB — BPAM RBC
Blood Product Expiration Date: 201901192359
Blood Product Expiration Date: 201901192359
ISSUE DATE / TIME: 201812210915
ISSUE DATE / TIME: 201812210915
Unit Type and Rh: 6200
Unit Type and Rh: 6200

## 2017-10-06 LAB — TYPE AND SCREEN
ABO/RH(D): A POS
Antibody Screen: NEGATIVE
Unit division: 0
Unit division: 0

## 2017-10-12 DIAGNOSIS — I10 Essential (primary) hypertension: Secondary | ICD-10-CM | POA: Diagnosis not present

## 2017-10-12 DIAGNOSIS — I25118 Atherosclerotic heart disease of native coronary artery with other forms of angina pectoris: Secondary | ICD-10-CM | POA: Diagnosis not present

## 2017-10-12 DIAGNOSIS — Z720 Tobacco use: Secondary | ICD-10-CM | POA: Diagnosis not present

## 2017-10-12 DIAGNOSIS — R0602 Shortness of breath: Secondary | ICD-10-CM | POA: Diagnosis not present

## 2017-10-12 DIAGNOSIS — E782 Mixed hyperlipidemia: Secondary | ICD-10-CM | POA: Diagnosis not present

## 2017-11-01 ENCOUNTER — Ambulatory Visit (INDEPENDENT_AMBULATORY_CARE_PROVIDER_SITE_OTHER): Payer: Medicare Other | Admitting: Family Medicine

## 2017-11-01 ENCOUNTER — Other Ambulatory Visit: Payer: Self-pay | Admitting: Family Medicine

## 2017-11-01 ENCOUNTER — Encounter: Payer: Self-pay | Admitting: Family Medicine

## 2017-11-01 VITALS — BP 142/70 | HR 74 | Temp 97.9°F | Resp 16 | Wt 180.6 lb

## 2017-11-01 DIAGNOSIS — I10 Essential (primary) hypertension: Secondary | ICD-10-CM | POA: Diagnosis not present

## 2017-11-01 DIAGNOSIS — Z951 Presence of aortocoronary bypass graft: Secondary | ICD-10-CM

## 2017-11-01 DIAGNOSIS — M4802 Spinal stenosis, cervical region: Secondary | ICD-10-CM

## 2017-11-01 MED ORDER — CYCLOBENZAPRINE HCL 10 MG PO TABS
ORAL_TABLET | ORAL | 1 refills | Status: DC
Start: 2017-11-01 — End: 2018-07-11

## 2017-11-01 NOTE — Patient Instructions (Signed)
Remember to sit for 10 seconds or so before getting up in the AM to minimize dizziness. Do followup with your cardiac surgeon and cardiologist as scheduled.

## 2017-11-01 NOTE — Progress Notes (Signed)
Subjective:     Patient ID: Craig [name], male   DOB: 02/28/1946, 72 y.o.   MRN: [id number] Chief Complaint  Patient presents with  . Dizziness    Patient comes in office today with complaints of dizziness for the past week. Patient states that he lays on his back when he sleeps and upon awakening in the morning he has a tingling sensation in his lower back he says that shoots to the top of his head causing him to feel dizzy. Patient states when he stands he feels like he is going to fall.    HPI He is recovering from CABG x 5 09/30/17. He has f/u pending with his CV surgeon in early February. He has already seen cardiology, Dr. Nehemiah Massed. He has post-op anemia but was noted to be improving on f/u CBC 10/12/17. Reports he has quit smoking and drinking alcohol. He states he has the previous habit of smoking when drinking [facility]. He is contemplating use of smoking cessation products. Continues to have postop sternal paresthesias. Wishes to refill muscle relaxant to help with sleep and muscle spasms. He is not currently on oxycodone. Review of Systems     Objective:   Physical Exam  Constitutional: He appears well-developed and well-nourished. No distress.  Cardiovascular: Normal rate and regular rhythm.  Pulmonary/Chest: Breath sounds normal.  Musculoskeletal: He exhibits no edema (of lower extremities).  Muscle strength in lower extremities 5/5. SLR's to 80 degrees without back pain or radiation of pain.       Assessment:    1. S/P CABG x 5: f/u pending with CVTS and cardiology.  2. Essential (primary) hypertension: stable on current medication  3. Cervical stenosis of spinal canal - cyclobenzaprine (FLEXERIL) 10 MG tablet; TAKE 10 MG BY ORAL ROUTE 3 TIMES EVERY DAY AS NEEDED FOR MUSCLE SPASMS  Dispense: 30 tablet; Refill: 1    Plan:    Discussed dizziness likely due to anemia but may benefit by sitting for 10 seconds with his feet one the floor before get gets up in the AM.

## 2017-11-14 ENCOUNTER — Ambulatory Visit: Payer: Self-pay | Admitting: Thoracic Surgery (Cardiothoracic Vascular Surgery)

## 2017-11-21 ENCOUNTER — Ambulatory Visit (INDEPENDENT_AMBULATORY_CARE_PROVIDER_SITE_OTHER): Payer: Self-pay | Admitting: Surgical

## 2017-11-21 ENCOUNTER — Other Ambulatory Visit: Payer: Self-pay | Admitting: Thoracic Surgery (Cardiothoracic Vascular Surgery)

## 2017-11-21 ENCOUNTER — Ambulatory Visit
Admission: RE | Admit: 2017-11-21 | Discharge: 2017-11-21 | Disposition: A | Discharge status: Home / Self Care | Payer: Medicare Other | Source: Ambulatory Visit | Attending: Thoracic Surgery (Cardiothoracic Vascular Surgery) | Admitting: Thoracic Surgery (Cardiothoracic Vascular Surgery)

## 2017-11-21 VITALS — BP 140/74 | HR 86 | Resp 20 | Ht 68.5 in | Wt 184.0 lb

## 2017-11-21 DIAGNOSIS — Z951 Presence of aortocoronary bypass graft: Secondary | ICD-10-CM

## 2017-11-21 DIAGNOSIS — I25119 Atherosclerotic heart disease of native coronary artery with unspecified angina pectoris: Secondary | ICD-10-CM

## 2017-11-21 DIAGNOSIS — J9811 Atelectasis: Secondary | ICD-10-CM | POA: Diagnosis not present

## 2017-11-21 NOTE — Progress Notes (Signed)
[address]       , [id number]             [phone]      [place] Medical Record #[id number] Date of Birth: 1946-05-27  Referring: Corey Skains, MD Primary Care: Carmon Ginsberg, [place] Primary Cardiologist: No primary care provider on file.   Chief Complaint:   POST OP FOLLOW UP Date of Procedure:    09/30/2017  Preoperative Diagnosis:        Severe 3-vessel Coronary Artery Disease  Stable Angina Pectoris  Postoperative Diagnosis:    Same  Procedure:        Coronary Artery Bypass Grafting x 5                       Left Internal Mammary Artery to Distal Left Anterior Descending Coronary Artery             Saphenous Vein Graft to Acute Marginal Branch of Right Coronary Artery             Saphenous Vein Graft to First Obtuse Marginal Branch of Left Circumflex Coronary Artery             Sequential Saphenous Vein Graft to Second Obtuse Marginal Branch of Left Circumflex Coronary Artery                Saphenous Vein Graft to Distal Left Circumflex Coronary Artery             Endoscopic Vein Harvest from Bilateral Thighs  Surgeon:        Valentina Gu. Roxy Manns, MD  Assistant:       Nani Skillern, PA-C  Anesthesia:    Nelda Severe. Tobias Alexander, MD  Operative Findings: ? Mild left ventricular systolic dysfunction ? Good quality left internal mammary artery conduit ? Good quality saphenous vein conduit ? Good quality target vessels for grafting ? Moderate atherosclerotic plaque involving the ascending aorta    History of Present Illness:    Patient is a 72 year old male status post the above procedure seen in the office in routine follow-up on today's date.  He reports that he is feeling well.  He has some minor sternal incision soreness but it is improved over time.  He is no longer taking pain medication.  He denies fevers ,chills or other constitutional symptoms.  He is ambulating well.  He denies shortness of breath  or chest pain/anginal equivalents.  His appetite has returned.  He has had no issues related to his surgical incisions.  He denies peripheral edema or palpitations.      Past Medical History:  Diagnosis Date  . Allergic rhinitis   . Coronary artery disease   . DDD (degenerative disc disease), cervical   . Early cataract   . Erectile dysfunction   . GERD (gastroesophageal reflux disease)   . Gouty arthritis   . Headache   . Hypercalcemia   . Hyperlipemia   . Hypertension   . Palpitations   . Peripheral vascular disease ([place])   . 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   LIMA to LAD SVG to [place] SVG SEQUENTIALLY to OM1 and OM2 SVG to ACUTE MARGINAL  . Spinal stenosis of cervical region   . Vitamin D deficiency      Social History   Tobacco Use  Smoking Status Current Every Day Smoker  . Packs/day: 1.00  . Years: 50.00  . Pack years:  50.00  . Types: Cigarettes  Smokeless Tobacco Former Systems developer  . Types: Chew    Social History   Substance and Sexual Activity  Alcohol Use Yes  . Alcohol/week: 7.2 oz  . Types: 12 Standard drinks or equivalent per week   Comment: Beer on the weekend     Allergies  Allergen Reactions  . Lipitor [Atorvastatin] Other (See Comments)    MYALGIAS  . Lisinopril Cough  . Zetia [Ezetimibe] Other (See Comments)    MYALGIAS  . Protonix [Pantoprazole] Other (See Comments)    headache    Current Outpatient Medications  Medication Sig Dispense Refill  . acetaminophen (TYLENOL) 500 MG tablet Take 2 tablets (1,000 mg total) by mouth every 6 (six) hours. 30 tablet 0  . aspirin 325 MG EC tablet Take 1 tablet (325 mg total) by mouth daily. 30 tablet 0  . cloNIDine (CATAPRES) 0.1 MG tablet Take 1 tablet (0.1 mg total) by mouth 3 (three) times daily. 60 tablet 1  . cyclobenzaprine (FLEXERIL) 10 MG tablet TAKE 10 MG BY ORAL ROUTE 3 TIMES EVERY DAY AS NEEDED FOR MUSCLE SPASMS 30 tablet 1  . diltiazem (CARDIZEM CD) 300 MG 24 hr capsule TAKE 1 CAPSULE (300  MG TOTAL) BY MOUTH DAILY. 90 capsule 0  . gabapentin (NEURONTIN) 300 MG capsule TAKE 2 CAPSULES BY MOUTH 3 TIMES A DAY (Patient taking differently: Take 600 mg by mouth 3 (three) times daily. ) 180 capsule 5  . metoprolol tartrate (LOPRESSOR) 25 MG tablet Take 1 tablet (25 mg total) by mouth 2 (two) times daily. 60 tablet 1  . Multiple Vitamin (MULTIVITAMIN WITH MINERALS) TABS tablet Take 1 tablet by mouth daily.    . OMEGA-3 FATTY ACIDS PO Take 1 capsule daily by mouth.     [name] omeprazole (PRILOSEC) 40 MG capsule Take 1 capsule (40 mg total) by mouth daily. 90 capsule 3  . oxyCODONE (OXY IR/ROXICODONE) 5 MG immediate release tablet Take 1 tablet (5 mg total) by mouth every 4 (four) hours as needed for severe pain. 30 tablet 0  . pravastatin (PRAVACHOL) 40 MG tablet Take 1 tablet (40 mg total) by mouth daily. (Patient taking differently: Take 80 mg by mouth daily. ) 90 tablet 3  . telmisartan (MICARDIS) 80 MG tablet Take 80 mg by mouth every morning.    . triamcinolone cream (KENALOG) 0.1 % Apply 1 application topically 2 (two) times daily as needed (rash on ear).     No current facility-administered medications for this visit.        Physical Exam: BP 140/74   Pulse 86   Resp 20   Ht 5' 8.5" (1.74 m)   Wt 184 lb (83.5 kg)   SpO2 97% Comment: RA  BMI 27.57 kg/m   General appearance: alert, cooperative and no distress Heart: regular rate and rhythm Lungs: clear to auscultation bilaterally Abdomen: soft, non-tender; bowel sounds normal; no masses,  no organomegaly Extremities: extremities normal, atraumatic, no cyanosis or edema Wound: Incisions noted to be healing well without evidence of infection.-   Diagnostic Studies & Laboratory data:     Recent Radiology Findings:   Dg Chest 2 View  Result Date: 11/21/2017 CLINICAL DATA:  CABG on 09/30/2017. Slight atelectasis on prior chest x-ray. EXAM: CHEST  2 VIEW COMPARISON:  Radiographs dated 10/05/2017 and 09/27/2017 FINDINGS: Heart  size and pulmonary vascularity are normal in the lungs are clear except for 2 tiny areas of linear atelectasis or scarring laterally in the left midzone. No  effusions. No pneumothorax. CABG.  Aortic atherosclerosis. IMPRESSION: No significant abnormalities. Aortic Atherosclerosis (ICD10-I70.0). Electronically Signed   By: Lorriane Shire M.D.   On: 11/21/2017 14:49      Recent Lab Findings: Lab Results  Component Value Date   WBC 8.5 10/05/2017   HGB 7.0 (L) 10/05/2017   HCT 22.4 (L) 10/05/2017   PLT 247 10/05/2017   GLUCOSE 106 (H) 10/05/2017   CHOL 195 03/31/2016   TRIG 111 03/31/2016   HDL 64 03/31/2016   LDLCALC 109 (H) 03/31/2016   ALT 15 (L) 09/27/2017   AST 19 09/27/2017   NA 137 10/05/2017   K 3.9 10/05/2017   CL 108 10/05/2017   CREATININE 0.89 10/05/2017   BUN 13 10/05/2017   CO2 22 10/05/2017   INR 1.28 09/30/2017   HGBA1C 5.6 09/27/2017      Assessment / Plan: Patient is doing quite well overall.  He will continue long-term follow-up with cardiology and primary care.  We will see him again in 2 months.         John Giovanni PA-C  11/21/2017 3:05 PM

## 2017-11-21 NOTE — Patient Instructions (Signed)
The patient was given verbal instructions regarding activity progression as well as driving protocol.

## 2017-11-24 ENCOUNTER — Other Ambulatory Visit: Payer: Self-pay | Admitting: Family Medicine

## 2017-11-24 MED ORDER — CLONIDINE HCL 0.1 MG PO TABS: 0.1000 mg | ORAL_TABLET | Freq: Three times a day (TID) | ORAL | 1 refills | Status: DC

## 2017-11-24 MED ORDER — PRAVASTATIN SODIUM 80 MG PO TABS: 80.0000 mg | ORAL_TABLET | Freq: Every day | ORAL | 3 refills | Status: DC

## 2017-12-01 ENCOUNTER — Other Ambulatory Visit: Payer: Self-pay | Admitting: Family Medicine

## 2017-12-01 ENCOUNTER — Telehealth: Payer: Self-pay | Admitting: Family Medicine

## 2017-12-01 MED ORDER — OMEPRAZOLE 40 MG PO CPDR: 40.0000 mg | DELAYED_RELEASE_CAPSULE | Freq: Every day | ORAL | 0 refills | Status: DC

## 2017-12-01 NOTE — Telephone Encounter (Signed)
Patient of [place] please review. KW 

## 2017-12-01 NOTE — Telephone Encounter (Signed)
Done

## 2017-12-01 NOTE — Telephone Encounter (Signed)
[facility] faxed refill request for following medications omeprazole (PRILOSEC) 40 MG capsule     Please advise,Thanks [place]

## 2017-12-05 ENCOUNTER — Other Ambulatory Visit: Payer: Self-pay | Admitting: Family Medicine

## 2017-12-12 DIAGNOSIS — R0602 Shortness of breath: Secondary | ICD-10-CM | POA: Diagnosis not present

## 2017-12-12 DIAGNOSIS — I5021 Acute systolic (congestive) heart failure: Secondary | ICD-10-CM | POA: Insufficient documentation

## 2017-12-12 DIAGNOSIS — I2510 Atherosclerotic heart disease of native coronary artery without angina pectoris: Secondary | ICD-10-CM | POA: Diagnosis not present

## 2017-12-12 DIAGNOSIS — E782 Mixed hyperlipidemia: Secondary | ICD-10-CM | POA: Diagnosis not present

## 2017-12-12 DIAGNOSIS — I10 Essential (primary) hypertension: Secondary | ICD-10-CM | POA: Diagnosis not present

## 2017-12-13 ENCOUNTER — Telehealth: Payer: Self-pay | Admitting: Family Medicine

## 2017-12-13 NOTE — Telephone Encounter (Signed)
Patient was advised he states that he is feeling well, I advised patient to give me a call back next week for update and to arrange appt. Patient understood. KW

## 2017-12-13 NOTE — Telephone Encounter (Signed)
Claiborne Billings with [facility] Cardiology (Dr. Nehemiah Massed)  States the patient's hemoglobin was extremely low.  Values:  12/12/17-8.9 2 mths ago 9.0 3 mths ago 10.1  States he had by-pass surgery a few months ago and has not states he was bleeding anywhere.  They are requesting we f/u with the patient.

## 2017-12-13 NOTE — Telephone Encounter (Signed)
As long as he is feeling ok-not dizzy or weak-would like to check in a week.If not feeling well then this week.

## 2017-12-13 NOTE — Telephone Encounter (Signed)
Please review labs, would you like to call patient and see if he can schedule follow up with [place] this week? KW

## 2017-12-22 ENCOUNTER — Telehealth: Payer: Self-pay | Admitting: Family Medicine

## 2017-12-22 NOTE — Telephone Encounter (Signed)
This could be related to patients blood pressure, I know he has had swelling before in past. Patient states that swelling is intermittent and not associated with any other symptoms. I believe it should be fine for patient to wait till tomorrow do you agree? Please review and advise. KW

## 2017-12-22 NOTE — Telephone Encounter (Signed)
[place] for appointment tomorrow. They took him off his diuretic after heart surgery.

## 2017-12-22 NOTE — Telephone Encounter (Signed)
Pt stated that his legs have been swelling off and on for the last few weeks and scheduled an appt with Craig [name] tomorrow 12/23/17 @ 11:20 am. Pt stated he didn't want to come in today. Pt denies any other symptoms. Wanted to make sure it was ok for pt to wait. Please advise. Thanks TNP

## 2017-12-23 ENCOUNTER — Ambulatory Visit: Payer: Self-pay | Admitting: Family Medicine

## 2017-12-23 ENCOUNTER — Ambulatory Visit (INDEPENDENT_AMBULATORY_CARE_PROVIDER_SITE_OTHER): Payer: Medicare Other | Admitting: Family Medicine

## 2017-12-23 ENCOUNTER — Encounter: Payer: Self-pay | Admitting: Family Medicine

## 2017-12-23 ENCOUNTER — Other Ambulatory Visit: Payer: Self-pay | Admitting: Family Medicine

## 2017-12-23 ENCOUNTER — Telehealth: Payer: Self-pay

## 2017-12-23 VITALS — BP 140/80 | HR 78 | Temp 99.2°F

## 2017-12-23 DIAGNOSIS — R600 Localized edema: Secondary | ICD-10-CM

## 2017-12-23 DIAGNOSIS — D582 Other hemoglobinopathies: Secondary | ICD-10-CM

## 2017-12-23 DIAGNOSIS — I5021 Acute systolic (congestive) heart failure: Secondary | ICD-10-CM | POA: Diagnosis not present

## 2017-12-23 MED ORDER — FUROSEMIDE 20 MG PO TABS: ORAL_TABLET | ORAL | 3 refills | Status: DC

## 2017-12-23 NOTE — Patient Instructions (Signed)
If your foot/ankle swelling is not improving you may wish to consider pressure stockings. Do follow up with the cardiologist.

## 2017-12-23 NOTE — Telephone Encounter (Signed)
-----   Message from Carmon Ginsberg, [place] sent at 12/23/2017  1:07 PM EDT ----- Let Birdena Crandall know if forget to give him the lab slip to recheck is anemia. I have sent the slip to the lab.

## 2017-12-23 NOTE — Telephone Encounter (Signed)
Left detailed voicemail on patients cell reminding him to have lab drawn. KW

## 2017-12-23 NOTE — Progress Notes (Addendum)
Subjective:     Patient ID: Craig [name], male   DOB: December 02, 1945, 72 y.o.   MRN:  Chief Complaint  Patient presents with  . Leg Swelling    Patient presents with bilateral ankle and feet swelling for the past 3 weeks. Patient reports his cardiologist gave him a RX for Lasix. The RX only for 2 days. Patient states symptoms were unchanged with Lasix. He denies any ankle and foot pain.   He is unsure whether swelling is gone in the AM. Reports having an echocardiogram today. Last visit with his cardiologist was felt to have sx of CHF and given two doses of furosemide. Denies shortness of breath today.  Review of Systems     Objective:   Physical Exam  Constitutional: He appears well-developed and well-nourished. No distress.  Cardiovascular: Normal rate and regular rhythm.  Pulmonary/Chest: Breath sounds normal.  Musculoskeletal: He exhibits edema (trace pedal edema bilaterally).       Assessment:    1. Pedal edema: try furosemide again on daily basis as needed pending cardiology f/u/    Plan:    Consider pressure stockings. Reassured him that swelling was mild. Ordered CBC to recheck post-op anemia.

## 2017-12-26 ENCOUNTER — Ambulatory Visit: Payer: Self-pay | Admitting: Family Medicine

## 2018-01-04 DIAGNOSIS — I2510 Atherosclerotic heart disease of native coronary artery without angina pectoris: Secondary | ICD-10-CM | POA: Diagnosis not present

## 2018-01-04 DIAGNOSIS — R600 Localized edema: Secondary | ICD-10-CM | POA: Insufficient documentation

## 2018-01-04 DIAGNOSIS — I119 Hypertensive heart disease without heart failure: Secondary | ICD-10-CM | POA: Diagnosis not present

## 2018-01-04 DIAGNOSIS — E782 Mixed hyperlipidemia: Secondary | ICD-10-CM | POA: Diagnosis not present

## 2018-01-04 DIAGNOSIS — I10 Essential (primary) hypertension: Secondary | ICD-10-CM | POA: Diagnosis not present

## 2018-01-17 ENCOUNTER — Other Ambulatory Visit: Payer: Self-pay | Admitting: Family Medicine

## 2018-01-19 ENCOUNTER — Other Ambulatory Visit: Payer: Self-pay | Admitting: Thoracic Surgery (Cardiothoracic Vascular Surgery)

## 2018-01-19 DIAGNOSIS — Z951 Presence of aortocoronary bypass graft: Secondary | ICD-10-CM

## 2018-01-23 ENCOUNTER — Ambulatory Visit: Payer: Medicare Other | Admitting: Thoracic Surgery (Cardiothoracic Vascular Surgery)

## 2018-01-23 ENCOUNTER — Ambulatory Visit
Admission: RE | Admit: 2018-01-23 | Discharge: 2018-01-23 | Disposition: A | Discharge status: Home / Self Care | Payer: Medicare Other | Source: Ambulatory Visit | Attending: Thoracic Surgery (Cardiothoracic Vascular Surgery) | Admitting: Thoracic Surgery (Cardiothoracic Vascular Surgery)

## 2018-01-23 ENCOUNTER — Other Ambulatory Visit: Payer: Self-pay

## 2018-01-23 ENCOUNTER — Encounter: Payer: Self-pay | Admitting: Thoracic Surgery (Cardiothoracic Vascular Surgery)

## 2018-01-23 VITALS — BP 151/81 | HR 87 | Resp 16 | Ht 68.5 in | Wt 197.0 lb

## 2018-01-23 DIAGNOSIS — Z951 Presence of aortocoronary bypass graft: Secondary | ICD-10-CM

## 2018-01-23 DIAGNOSIS — I25119 Atherosclerotic heart disease of native coronary artery with unspecified angina pectoris: Secondary | ICD-10-CM | POA: Diagnosis not present

## 2018-01-23 NOTE — Patient Instructions (Addendum)
Continue all previous medications without any changes at this time  You may resume unrestricted physical activity without any particular limitations at this time.  Make every effort to stay physically active, get some type of exercise on a regular basis, and stick to a "heart healthy diet".  The long term benefits for regular exercise and a healthy diet are critically important to your overall health and wellbeing.  

## 2018-01-23 NOTE — Progress Notes (Signed)
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  The patient's postoperative recovery was uneventful and he was last seen here in our office on November 21, 2017 at which time he was doing well.  Since then he has been followed carefully by Dr. Nehemiah Massed who saw him most recently on January 04, 2018.  His medications for hypertension and chronic diastolic congestive heart failure have been adjusted.  The patient returns to the office today for follow-up.  He states that he decided not to participate in the outpatient cardiac rehab program because he walks a fair amount every day on his own at home.  He would like to go back to working.  He states that overall he feels well.  His only complaint is that of some pain and numbness in both feet.  He no longer has any exertional chest pain or chest tightness.  He states that his breathing is good.  He does have some mild residual soreness in his chest from his sternotomy, but this continues to improve.   Current Outpatient Medications  Medication Sig Dispense Refill  . acetaminophen (TYLENOL) 500 MG tablet Take 2 tablets (1,000 mg total) by mouth every 6 (six) hours. 30 tablet 0  . aspirin EC 81 MG tablet Take 81 mg by mouth daily.    . cloNIDine (CATAPRES) 0.1 MG tablet Take 1 tablet (0.1 mg total) by mouth 3 (three) times daily. 270 tablet 1  . cyclobenzaprine (FLEXERIL) 10 MG tablet TAKE 10 MG BY ORAL ROUTE 3 TIMES EVERY DAY AS NEEDED FOR MUSCLE SPASMS 30 tablet 1  . diltiazem  (CARDIZEM CD) 300 MG 24 hr capsule TAKE 1 CAPSULE (300 MG TOTAL) BY MOUTH DAILY. 90 capsule 3  . furosemide (LASIX) 20 MG tablet One daily as needed for foot/ankle swelling. 30 tablet 3  . gabapentin (NEURONTIN) 300 MG capsule TAKE 2 CAPSULES BY MOUTH 3 TIMES A DAY (Patient taking differently: Take 600 mg by mouth 3 (three) times daily. ) 180 capsule 5  . metoprolol tartrate (LOPRESSOR) 25 MG tablet Take 1 tablet (25 mg total) by mouth 2 (two) times daily. 60 tablet 1  . Multiple Vitamin (MULTIVITAMIN WITH MINERALS) TABS tablet Take 1 tablet by mouth daily.    . OMEGA-3 FATTY ACIDS PO Take 1 capsule daily by mouth.     [name] Kitchen omeprazole (PRILOSEC) 40 MG capsule Take 1 capsule (40 mg total) by mouth daily. 90 capsule 0  . pravastatin (PRAVACHOL) 80 MG tablet Take 1 tablet (80 mg total) by mouth daily. 90 tablet 3  . telmisartan (MICARDIS) 80 MG tablet Take 80 mg by mouth every morning.    . triamcinolone cream (KENALOG) 0.1 % Apply 1 application topically 2 (two) times daily as needed (rash on ear).     No current facility-administered medications for this visit.       Physical Exam:   BP (!) 151/81 (BP Location: Left Arm, Patient Position: Sitting, Cuff Size: Large)  Pulse 87   Resp 16   Ht 5' 8.5" (1.74 m)   Wt 197 lb (89.4 kg)   SpO2 99% Comment: ON RA  BMI 29.52 kg/m   General:  Well-appearing  Chest:   Clear  CV:   Regular rate and rhythm  Incisions:  Healing nicely, sternum is stable  Abdomen:  Soft nontender  Extremities:  Warm and well perfused  Diagnostic Tests:  CHEST - 2 VIEW  COMPARISON:  PA and lateral chest 11/21/2017. Single-view of the chest 10/05/2017.  FINDINGS: Eight intact median sternotomy wires are unchanged. Lungs are clear. No pneumothorax or pleural effusion. Aortic atherosclerosis is noted. Heart size is upper normal. Mild compression fracture deformity mid upper thoracic spine and thoracic spondylosis are unchanged.  IMPRESSION: No acute  disease.  Atherosclerosis.   Electronically Signed   By: Inge Rise M.D.   On: 01/23/2018 15:02   Impression:  Patient is doing well nearly 4 months status post coronary artery bypass grafting  Plan:  I have encouraged the patient to continue to gradually increase his physical activity without any particular limitations.  We have not recommended any changes to his current medications.  The importance of long-term adherence to medical therapy for hypertension and hyperlipidemia have been emphasized.  The importance that the patient continue to avoid smoking cigarettes at all cost of been discussed.  All of his questions have been answered.  The patient will return to our office next December for routine follow-up approximately 1 year following surgery.  During the interim he will call and return only should specific problems or questions arise.  I spent in excess of 15 minutes during the conduct of this office consultation and >50% of this time involved direct face-to-face encounter with the patient for counseling and/or coordination of their care.    Valentina Gu. Roxy Manns, MD 01/23/2018 3:21 PM

## 2018-01-26 DIAGNOSIS — H40003 Preglaucoma, unspecified, bilateral: Secondary | ICD-10-CM | POA: Diagnosis not present

## 2018-01-30 DIAGNOSIS — H401121 Primary open-angle glaucoma, left eye, mild stage: Secondary | ICD-10-CM | POA: Diagnosis not present

## 2018-02-06 ENCOUNTER — Other Ambulatory Visit: Payer: Self-pay | Admitting: Family Medicine

## 2018-02-27 ENCOUNTER — Other Ambulatory Visit: Payer: Self-pay | Admitting: Family Medicine

## 2018-03-01 DIAGNOSIS — I119 Hypertensive heart disease without heart failure: Secondary | ICD-10-CM | POA: Diagnosis not present

## 2018-03-01 DIAGNOSIS — I10 Essential (primary) hypertension: Secondary | ICD-10-CM | POA: Diagnosis not present

## 2018-03-01 DIAGNOSIS — R600 Localized edema: Secondary | ICD-10-CM | POA: Diagnosis not present

## 2018-03-01 DIAGNOSIS — I2510 Atherosclerotic heart disease of native coronary artery without angina pectoris: Secondary | ICD-10-CM | POA: Diagnosis not present

## 2018-03-17 ENCOUNTER — Ambulatory Visit: Payer: Medicare Other | Admitting: Family Medicine

## 2018-03-17 ENCOUNTER — Encounter: Payer: Self-pay | Admitting: Family Medicine

## 2018-03-17 VITALS — BP 154/76 | HR 64 | Temp 97.6°F | Resp 16 | Wt 194.8 lb

## 2018-03-17 DIAGNOSIS — M25561 Pain in right knee: Secondary | ICD-10-CM | POA: Diagnosis not present

## 2018-03-17 DIAGNOSIS — M7989 Other specified soft tissue disorders: Secondary | ICD-10-CM | POA: Diagnosis not present

## 2018-03-17 MED ORDER — DICLOFENAC SODIUM 1 % TD GEL: 4.0000 g | Freq: Four times a day (QID) | TRANSDERMAL | 0 refills | Status: DC

## 2018-03-17 NOTE — Progress Notes (Signed)
  Subjective:     Patient ID: Craig [name], male   DOB: 11-23-1945, 72 y.o.   MRN: [id number] Chief Complaint  Patient presents with  . Knee Pain    Patient comes in office with complaint of right knee pain for the past 4 days. Patient states that on Monday he was stepping onto tractor when he bumped his knee on the side, patient reports swelling and warmth to the touch.    HPI States his knee was more swollen this AM and he was having pain with w.b. Has since improved since he came in to the office. Continues to be followed by cardiology but reports persistent lower leg swelling despite spironolactone. States he won't be working for a while after today.  Review of Systems     Objective:   Physical Exam  Constitutional: He appears well-developed and well-nourished. No distress.  Musculoskeletal: He exhibits edema (1+ pre-tibial below his knees bilaterally).  Mild right knee swelling with mild increased warmth but no erythema, abrasion, or laceration. Minimal tenderness to his patella,. FROM without significant discomfort.       Assessment:    1. Leg swelling: ? Due to medication-discussed tapering gabapentin  2. Acute pain of right knee: ? Osteoarthritis flare: try diclofenac gel for one week.    Plan:   Discussed gabapentin taper. Call if knee not continuing to improve for consideration or x-ray/orthopedic referral.

## 2018-03-17 NOTE — Patient Instructions (Signed)
Reduce gabapentin to one pill twice daily and see if leg swelling improves. After a week may reduce further if your nerve pain allows.

## 2018-03-22 ENCOUNTER — Telehealth: Payer: Self-pay | Admitting: Family Medicine

## 2018-03-22 NOTE — Telephone Encounter (Signed)
Pt returned call ° °teri °

## 2018-03-22 NOTE — Telephone Encounter (Signed)
Spoke with patient on the phone who states that he received a call from office but was unsure who called him? I didn't see message or note of a recent call did you contact patient?KW

## 2018-03-23 NOTE — Telephone Encounter (Signed)
Patient advised that insurance will not cover Voltaren 1% gel, per Mikki Santee patient can try otc Zostrix cream. Patient voiced understanding. KW

## 2018-03-23 NOTE — Telephone Encounter (Signed)
Diclofenac gel was declined by his insurance. I wanted him to try Zostrix cream otc for his knee arthritis.

## 2018-04-01 ENCOUNTER — Other Ambulatory Visit: Payer: Self-pay | Admitting: Family Medicine

## 2018-05-02 DIAGNOSIS — H401121 Primary open-angle glaucoma, left eye, mild stage: Secondary | ICD-10-CM | POA: Diagnosis not present

## 2018-05-02 DIAGNOSIS — R600 Localized edema: Secondary | ICD-10-CM | POA: Diagnosis not present

## 2018-05-02 DIAGNOSIS — E782 Mixed hyperlipidemia: Secondary | ICD-10-CM | POA: Diagnosis not present

## 2018-05-02 DIAGNOSIS — I2510 Atherosclerotic heart disease of native coronary artery without angina pectoris: Secondary | ICD-10-CM | POA: Diagnosis not present

## 2018-05-02 DIAGNOSIS — I10 Essential (primary) hypertension: Secondary | ICD-10-CM | POA: Diagnosis not present

## 2018-05-02 DIAGNOSIS — Z720 Tobacco use: Secondary | ICD-10-CM | POA: Diagnosis not present

## 2018-05-17 ENCOUNTER — Other Ambulatory Visit: Payer: Self-pay | Admitting: Family Medicine

## 2018-05-22 ENCOUNTER — Other Ambulatory Visit: Payer: Self-pay | Admitting: Family Medicine

## 2018-05-22 DIAGNOSIS — H60543 Acute eczematoid otitis externa, bilateral: Secondary | ICD-10-CM

## 2018-05-26 DIAGNOSIS — I10 Essential (primary) hypertension: Secondary | ICD-10-CM | POA: Diagnosis not present

## 2018-05-26 DIAGNOSIS — M4316 Spondylolisthesis, lumbar region: Secondary | ICD-10-CM | POA: Diagnosis not present

## 2018-05-26 DIAGNOSIS — M5416 Radiculopathy, lumbar region: Secondary | ICD-10-CM | POA: Diagnosis not present

## 2018-05-26 DIAGNOSIS — M545 Low back pain: Secondary | ICD-10-CM | POA: Diagnosis not present

## 2018-05-31 DIAGNOSIS — R600 Localized edema: Secondary | ICD-10-CM | POA: Diagnosis not present

## 2018-05-31 DIAGNOSIS — I10 Essential (primary) hypertension: Secondary | ICD-10-CM | POA: Diagnosis not present

## 2018-05-31 DIAGNOSIS — I119 Hypertensive heart disease without heart failure: Secondary | ICD-10-CM | POA: Diagnosis not present

## 2018-05-31 DIAGNOSIS — I2510 Atherosclerotic heart disease of native coronary artery without angina pectoris: Secondary | ICD-10-CM | POA: Diagnosis not present

## 2018-05-31 DIAGNOSIS — E782 Mixed hyperlipidemia: Secondary | ICD-10-CM | POA: Diagnosis not present

## 2018-06-01 ENCOUNTER — Other Ambulatory Visit: Payer: Self-pay | Admitting: Neurosurgery

## 2018-06-01 DIAGNOSIS — M5416 Radiculopathy, lumbar region: Secondary | ICD-10-CM

## 2018-06-17 ENCOUNTER — Ambulatory Visit
Admission: RE | Admit: 2018-06-17 | Discharge: 2018-06-17 | Disposition: A | Discharge status: Home / Self Care | Payer: Medicare Other | Source: Ambulatory Visit | Attending: Neurosurgery | Admitting: Neurosurgery

## 2018-06-17 DIAGNOSIS — M5116 Intervertebral disc disorders with radiculopathy, lumbar region: Secondary | ICD-10-CM | POA: Insufficient documentation

## 2018-06-17 DIAGNOSIS — M5416 Radiculopathy, lumbar region: Secondary | ICD-10-CM | POA: Diagnosis present

## 2018-06-17 DIAGNOSIS — M48061 Spinal stenosis, lumbar region without neurogenic claudication: Secondary | ICD-10-CM | POA: Insufficient documentation

## 2018-06-17 MED ORDER — GADOBENATE DIMEGLUMINE 529 MG/ML IV SOLN
18.0000 mL | Freq: Once | INTRAVENOUS | Status: AC | PRN
  Administered 2018-06-17: 18 mL via INTRAVENOUS

## 2018-06-26 LAB — POCT I-STAT CREATININE: Creatinine, Ser: 1.2 mg/dL (ref 0.61–1.24)

## 2018-06-28 DIAGNOSIS — I2510 Atherosclerotic heart disease of native coronary artery without angina pectoris: Secondary | ICD-10-CM | POA: Diagnosis not present

## 2018-06-28 DIAGNOSIS — E782 Mixed hyperlipidemia: Secondary | ICD-10-CM | POA: Diagnosis not present

## 2018-06-28 DIAGNOSIS — R0602 Shortness of breath: Secondary | ICD-10-CM | POA: Diagnosis not present

## 2018-06-28 DIAGNOSIS — I10 Essential (primary) hypertension: Secondary | ICD-10-CM | POA: Diagnosis not present

## 2018-06-28 DIAGNOSIS — R600 Localized edema: Secondary | ICD-10-CM | POA: Diagnosis not present

## 2018-07-11 ENCOUNTER — Other Ambulatory Visit: Payer: Self-pay | Admitting: Family Medicine

## 2018-07-11 ENCOUNTER — Encounter: Payer: Self-pay | Admitting: Family Medicine

## 2018-07-11 ENCOUNTER — Ambulatory Visit: Payer: Medicare Other | Admitting: Family Medicine

## 2018-07-11 VITALS — BP 160/72 | HR 76 | Temp 98.7°F | Resp 16 | Wt 200.8 lb

## 2018-07-11 DIAGNOSIS — R1013 Epigastric pain: Secondary | ICD-10-CM | POA: Diagnosis not present

## 2018-07-11 DIAGNOSIS — R1011 Right upper quadrant pain: Secondary | ICD-10-CM

## 2018-07-11 NOTE — Patient Instructions (Signed)
We will call you about the lab results and when to get the ultrasound of your abdomen.

## 2018-07-11 NOTE — Progress Notes (Signed)
  Subjective:     Patient ID: Craig [name], male   DOB: 05-13-1946, 72 y.o.   MRN: [id number] Chief Complaint  Patient presents with  . Abdominal Pain    Patient comes in to office today for upper abdominal pain with pressure like symptoms while laying down and discomfort after eating. Patient states he has some body shake and gas. Patient symptoms has woresen for the last 30 days.   HPI Reports compliance with omeprazole. Normal colonoscopy in 2011. Recent follow up with cardiology. States he was taken off of HCTZ but don't see that mentioned in the consult note. States he no longer drinks alcohol.  Review of Systems     Objective:   Physical Exam  Constitutional: He appears well-developed and well-nourished. No distress.  Abdominal: Bowel sounds are normal. There is tenderness in the right upper quadrant and epigastric area. There is positive Murphy's sign.       Assessment:    1. Right upper quadrant abdominal pain - Comprehensive metabolic panel - US Abdomen Complete; Future  2. Epigastric pain - CBC with Differential/Platelet - H. pylori breath test - Lipase    Plan:    Further f/u pending lab work and ultrasound. Nurse bp check in the next week.

## 2018-07-12 ENCOUNTER — Telehealth: Payer: Self-pay

## 2018-07-12 LAB — COMPREHENSIVE METABOLIC PANEL
ALT: 10 IU/L (ref 0–44)
AST: 11 IU/L (ref 0–40)
Albumin/Globulin Ratio: 1.7 (ref 1.2–2.2)
Albumin: 4.3 g/dL (ref 3.5–4.8)
Alkaline Phosphatase: 62 IU/L (ref 39–117)
BUN/Creatinine Ratio: 9 — ABNORMAL LOW (ref 10–24)
BUN: 9 mg/dL (ref 8–27)
Bilirubin Total: 0.3 mg/dL (ref 0.0–1.2)
CO2: 20 mmol/L (ref 20–29)
Calcium: 9.8 mg/dL (ref 8.6–10.2)
Chloride: 104 mmol/L (ref 96–106)
Creatinine, Ser: 1 mg/dL (ref 0.76–1.27)
GFR calc Af Amer: 87 mL/min/{1.73_m2} (ref >59)
GFR calc non Af Amer: 75 mL/min/{1.73_m2} (ref >59)
Globulin, Total: 2.5 g/dL (ref 1.5–4.5)
Glucose: 113 mg/dL — ABNORMAL HIGH (ref 65–99)
Potassium: 4.4 mmol/L (ref 3.5–5.2)
Sodium: 142 mmol/L (ref 134–144)
Total Protein: 6.8 g/dL (ref 6.0–8.5)

## 2018-07-12 LAB — CBC WITH DIFFERENTIAL/PLATELET
Basophils Absolute: 0 10*3/uL (ref 0.0–0.2)
Basos: 1 %
EOS (ABSOLUTE): 0.1 10*3/uL (ref 0.0–0.4)
Eos: 3 %
Hematocrit: 32.4 % — ABNORMAL LOW (ref 37.5–51.0)
Hemoglobin: 10.3 g/dL — ABNORMAL LOW (ref 13.0–17.7)
Immature Grans (Abs): 0 10*3/uL (ref 0.0–0.1)
Immature Granulocytes: 0 %
Lymphocytes Absolute: 1.3 10*3/uL (ref 0.7–3.1)
Lymphs: 30 %
MCH: 25.8 pg — ABNORMAL LOW (ref 26.6–33.0)
MCHC: 31.8 g/dL (ref 31.5–35.7)
MCV: 81 fL (ref 79–97)
Monocytes Absolute: 0.4 10*3/uL (ref 0.1–0.9)
Monocytes: 10 %
Neutrophils Absolute: 2.4 10*3/uL (ref 1.4–7.0)
Neutrophils: 56 %
Platelets: 299 10*3/uL (ref 150–450)
RBC: 4 x10E6/uL — ABNORMAL LOW (ref 4.14–5.80)
RDW: 16.2 % — ABNORMAL HIGH (ref 12.3–15.4)
WBC: 4.2 10*3/uL (ref 3.4–10.8)

## 2018-07-12 LAB — LIPASE: Lipase: 16 U/L (ref 13–78)

## 2018-07-12 NOTE — Telephone Encounter (Signed)
Pt advised.   Thanks,   -Akira Adelsberger  

## 2018-07-12 NOTE — Telephone Encounter (Signed)
-----   Message from Carmon Ginsberg, [place] sent at 07/12/2018  7:42 AM EDT ----- Mild anemia but much better than since your bypass surgery. Other labs ok pending breath test for ulcers and the ultrasound for gallstones.

## 2018-07-13 ENCOUNTER — Telehealth: Payer: Self-pay

## 2018-07-13 LAB — H. PYLORI BREATH TEST: H pylori Breath Test: NEGATIVE

## 2018-07-13 NOTE — Telephone Encounter (Signed)
Patient was advised.PC

## 2018-07-13 NOTE — Telephone Encounter (Signed)
-----   Message from Carmon Ginsberg, [place] sent at 07/13/2018 12:23 PM EDT ----- Test for ulcers ok. Will see what the ultrasound shows when completed.

## 2018-07-18 ENCOUNTER — Ambulatory Visit
Admission: RE | Admit: 2018-07-18 | Discharge: 2018-07-18 | Disposition: A | Discharge status: Home / Self Care | Payer: Medicare Other | Source: Ambulatory Visit | Attending: Family Medicine | Admitting: Family Medicine

## 2018-07-18 DIAGNOSIS — R1011 Right upper quadrant pain: Secondary | ICD-10-CM

## 2018-07-24 ENCOUNTER — Ambulatory Visit
Admission: RE | Admit: 2018-07-24 | Discharge: 2018-07-24 | Disposition: A | Discharge status: Home / Self Care | Payer: Medicare Other | Source: Ambulatory Visit | Attending: Family Medicine | Admitting: Family Medicine

## 2018-07-24 DIAGNOSIS — K8020 Calculus of gallbladder without cholecystitis without obstruction: Secondary | ICD-10-CM | POA: Insufficient documentation

## 2018-07-24 DIAGNOSIS — N281 Cyst of kidney, acquired: Secondary | ICD-10-CM | POA: Diagnosis not present

## 2018-07-24 DIAGNOSIS — R1011 Right upper quadrant pain: Secondary | ICD-10-CM | POA: Insufficient documentation

## 2018-07-25 ENCOUNTER — Telehealth: Payer: Self-pay

## 2018-07-25 ENCOUNTER — Other Ambulatory Visit: Payer: Self-pay | Admitting: Family Medicine

## 2018-07-25 DIAGNOSIS — K8020 Calculus of gallbladder without cholecystitis without obstruction: Secondary | ICD-10-CM

## 2018-07-25 NOTE — Telephone Encounter (Signed)
-----   Message from Carmon Ginsberg, [place] sent at 07/24/2018 11:41 AM EDT ----- Gallstones present which are the probable cause of his abdominal pain. Does he wish to see a surgeon about this?

## 2018-07-25 NOTE — Telephone Encounter (Signed)
Referral in progess

## 2018-07-25 NOTE — Telephone Encounter (Signed)
Patient was advised and would like to be referred to an surgeon in [place] area.

## 2018-08-03 ENCOUNTER — Encounter: Payer: Self-pay | Admitting: Surgery

## 2018-08-03 ENCOUNTER — Other Ambulatory Visit: Payer: Self-pay

## 2018-08-03 ENCOUNTER — Ambulatory Visit: Payer: Medicare Other | Admitting: Family Medicine

## 2018-08-03 ENCOUNTER — Encounter: Payer: Self-pay | Admitting: *Deleted

## 2018-08-03 ENCOUNTER — Encounter: Payer: Self-pay | Admitting: Family Medicine

## 2018-08-03 ENCOUNTER — Ambulatory Visit: Payer: Medicare Other | Admitting: Surgery

## 2018-08-03 VITALS — BP 206/92 | HR 73 | Temp 97.9°F | Ht 69.0 in | Wt 195.8 lb

## 2018-08-03 VITALS — BP 180/84 | HR 62 | Temp 97.9°F | Wt 196.2 lb

## 2018-08-03 DIAGNOSIS — I10 Essential (primary) hypertension: Secondary | ICD-10-CM | POA: Diagnosis not present

## 2018-08-03 DIAGNOSIS — K8020 Calculus of gallbladder without cholecystitis without obstruction: Secondary | ICD-10-CM

## 2018-08-03 MED ORDER — HYDROCHLOROTHIAZIDE 12.5 MG PO CAPS: 12.5000 mg | ORAL_CAPSULE | Freq: Every day | ORAL | 1 refills | Status: DC

## 2018-08-03 NOTE — Progress Notes (Signed)
  Subjective:     Patient ID: Craig [name], male   DOB: 03/11/1946, 72 y.o.   MRN: [id number] Chief Complaint  Patient presents with  . Hypertension    Patient presents today due to high bp reading 206/92 and 194/84.   HPI He was getting a surgical consultation for gallstones when found his bp was elevated. Craig [name] states he feels fine. On review of his med list from home he reports he had been told to d/c HCTZ per cardiology. This was unclear from cardiology notes. Also remains on micardis and not valsartan as listed.  Review of Systems     Objective:   Physical Exam  Constitutional: He appears well-developed and well-nourished. No distress.  Cardiovascular: Normal rate and regular rhythm.  Pulmonary/Chest: Breath sounds normal.  Musculoskeletal: Edema: 1+ pre-tibial edema.       Assessment:    1. Essential (primary) hypertension: resume HCTZ      Plan:    F/u in one week. He is not sure whether he wants to proceed with cholecystectomy.

## 2018-08-03 NOTE — Progress Notes (Signed)
Patient instructed to contact the office if he wishes to schedule gallbladder surgery.   The patient will require a pre-op visit with Dr. Rosana Hoes if more than 30 days out per Freda Munro.   In the meantime, a request for medical clearance has been faxed to Mariel Sleet, [place] at [facility].   We have also requested cardiac clearance from Dr. Serafina Royals.   Will await to hear back from patient regarding surgery scheduling.

## 2018-08-03 NOTE — Patient Instructions (Addendum)
We scheduled an appointment with Dr.Chavin at 10 am today for your elevated blood pressure.   Please call our office if you decide to have your gallbladder removed.   Cholelithiasis Cholelithiasis is also called "gallstones." It is a kind of gallbladder disease. The gallbladder is an organ that stores a liquid (bile) that helps you digest fat. Gallstones may not cause symptoms (may be silent gallstones) until they cause a blockage, and then they can cause pain (gallbladder attack). Follow these instructions at home:  Take over-the-counter and prescription medicines only as told by your doctor.  Stay at a healthy weight.  Eat healthy foods. This includes: ? Eating fewer fatty foods, like fried foods. ? Eating fewer refined carbs (refined carbohydrates). Refined carbs are breads and grains that are highly processed, like white bread and white rice. Instead, choose whole grains like whole-wheat bread and brown rice. ? Eating more fiber. Almonds, fresh fruit, and beans are healthy sources of fiber.  Keep all follow-up visits as told by your doctor. This is important. Contact a doctor if:  You have sudden pain in the upper right side of your belly (abdomen). Pain might spread to your right shoulder or your chest. This may be a sign of a gallbladder attack.  You feel sick to your stomach (are nauseous).  You throw up (vomit).  You have been diagnosed with gallstones that have no symptoms and you get: ? Belly pain. ? Discomfort, burning, or fullness in the upper part of your belly (indigestion). Get help right away if:  You have sudden pain in the upper right side of your belly, and it lasts for more than 2 hours.  You have belly pain that lasts for more than 5 hours.  You have a fever or chills.  You keep feeling sick to your stomach or you keep throwing up.  Your skin or the whites of your eyes turn yellow (jaundice).  You have dark-colored pee (urine).  You have light-colored  poop (stool). Summary  Cholelithiasis is also called "gallstones."  The gallbladder is an organ that stores a liquid (bile) that helps you digest fat.  Silent gallstones are gallstones that do not cause symptoms.  A gallbladder attack may cause sudden pain in the upper right side of your belly. Pain might spread to your right shoulder or your chest. If this happens, contact your doctor.  If you have sudden pain in the upper right side of your belly that lasts for more than 2 hours, get help right away. This information is not intended to replace advice given to you by your health care provider. Make sure you discuss any questions you have with your health care provider. Document Released: 03/15/2008 Document Revised: 06/13/2016 Document Reviewed: 06/13/2016 Elsevier Interactive Patient Education  2017 [employer].

## 2018-08-03 NOTE — Progress Notes (Addendum)
Surgical Clinic History and Physical  Referring provider:  Carmon Ginsberg, [address] [place], [place] [address]  HISTORY OF PRESENT ILLNESS (HPI):  72 y.o. male presents for evaluation of abdominal pain. Patient reports he's been experiencing both epigastric and RUQ soreness along with what he describes as a "bubble" beneath his upper abdomen. Patient was worked up by his primary care physician for peptic ulcer disease without any significant findings. Patient describes his GERD has been well-controlled with omeprazole. He also complains of Right-sided back pain, but is unable to determine whether this pain radiates from his abdomen. He has not noticed whether or not his pain is worse after eating fatty foods, but says it seems to be worse after eating. He otherwise denies any fever/chills, N/V, CP, or SOB.  PAST MEDICAL HISTORY (PMH):  Past Medical History:  Diagnosis Date  . Allergic rhinitis   . Coronary artery disease   . DDD (degenerative disc disease), cervical   . Early cataract   . Erectile dysfunction   . GERD (gastroesophageal reflux disease)   . Gouty arthritis   . Headache   . Hypercalcemia   . Hyperlipemia   . Hypertension   . Palpitations   . Peripheral vascular disease ([place])   . 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 Spinal stenosis of cervical region   . Vitamin D deficiency     PAST SURGICAL HISTORY ([place]):  Past Surgical History:  Procedure Laterality Date  . BACK SURGERY      fusion with screws  . COLONOSCOPY    . CORONARY ARTERY BYPASS GRAFT N/A 09/30/2017   Procedure: CORONARY ARTERY BYPASS GRAFTING times five using right and left Saphaneous vein harvested endoscopicly  and left internal mammary artery. (CABG),TEE;  Surgeon: Rexene Alberts, MD;  Location: [facility];  Service: Open Heart Surgery;  Laterality: N/A;  . LEFT HEART CATH AND CORONARY ANGIOGRAPHY  Left 09/06/2017   Procedure: LEFT HEART CATH AND CORONARY ANGIOGRAPHY;  Surgeon: Corey Skains, MD;  Location: [facility];  Service: Cardiovascular;  Laterality: Left;  . percutaneous transluminal balloon angioplasty  01/2010   of left lower extremity  . TEE WITHOUT CARDIOVERSION N/A 09/30/2017   Procedure: TRANSESOPHAGEAL ECHOCARDIOGRAM (TEE);  Surgeon: Rexene Alberts, MD;  Location: [facility];  Service: Open Heart Surgery;  Laterality: N/A;  . VASCULAR SURGERY Left 2010   left exernal iliac and superficial femoral artery PTCA and stenting    MEDICATIONS:  Prior to Admission medications   Medication Sig Start Date End Date Taking? Authorizing Provider  acetaminophen (TYLENOL) 500 MG tablet Take 2 tablets (1,000 mg total) by mouth every 6 (six) hours. 10/05/17  Yes [name], PA-C  aspirin EC 81 MG tablet Take 81 mg by mouth daily.   Yes [provider]  carvedilol (COREG) 6.25 MG tablet Take 6.25 mg by mouth 2 (two) times daily with a meal. 03/01/18  Yes [provider]  cloNIDine (CATAPRES) 0.1 MG tablet TAKE 1 TABLET (0.1 MG TOTAL) BY MOUTH 3 (THREE) TIMES DAILY. 05/17/18  Yes Carmon Ginsberg, PA  diltiazem (CARDIZEM CD) 300 MG 24 hr capsule TAKE 1 CAPSULE (300 MG TOTAL) BY MOUTH DAILY. 01/17/18  Yes Carmon Ginsberg, PA  gabapentin (NEURONTIN) 300 MG capsule TAKE 2 CAPSULES BY MOUTH 3 TIMES A DAY 09/02/17  Yes Carmon Ginsberg, PA  hydrochlorothiazide (MICROZIDE) 12.5 MG  capsule Take 12.5 mg by mouth daily.   Yes [provider]  latanoprost (XALATAN) 0.005 % ophthalmic solution INSTILL ONE DROP IN BOTH EYES AT BEDTIME. 07/15/18  Yes [provider]  Multiple Vitamin (MULTIVITAMIN WITH MINERALS) TABS tablet Take 1 tablet by mouth daily.   Yes [provider]  OMEGA-3 FATTY ACIDS PO Take 1 capsule daily by mouth.    Yes [provider]  omeprazole (PRILOSEC) 40 MG capsule TAKE 1 CAPSULE BY MOUTH EVERY DAY 02/27/18  Yes Carmon Ginsberg, PA  pravastatin (PRAVACHOL) 80 MG tablet Take 1 tablet (80 mg total) by mouth daily. 11/24/17  Yes Carmon Ginsberg, PA  triamcinolone cream (KENALOG) 0.1 % Apply 1 application topically 2 (two) times daily as needed (rash on ear).   Yes [provider]  valsartan (DIOVAN) 160 MG tablet TAKE 1 TABLET (160 MG TOTAL) BY MOUTH ONCE DAILY 06/21/18  Yes [provider]    ALLERGIES:  Allergies  Allergen Reactions  . Lipitor [Atorvastatin] Other (See Comments)    MYALGIAS  . Lisinopril Cough  . Zetia [Ezetimibe] Other (See Comments)    MYALGIAS  . Spironolactone     Other reaction(s): Other (See Comments) Breast tenderness  . Protonix [Pantoprazole] Other (See Comments)    headache    SOCIAL HISTORY:  Social History   Socioeconomic History  . Marital status: Married    Spouse name: Not on file  . Number of children: Not on file  . Years of education: Not on file  . Highest education level: Not on file  Occupational History  . Not on file  Social Needs  . Financial resource strain: Not on file  . Food insecurity:    Worry: Not on file    Inability: Not on file  . Transportation needs:    Medical: Not on file    Non-medical: Not on file  Tobacco Use  . Smoking status: Former Smoker    Packs/day: 1.00    Years: 50.00    Pack years: 50.00    Types: Cigarettes    Last attempt to quit: 09/27/2017    Years since quitting: 0.8  . Smokeless tobacco: Former Systems developer    Types: Chew  Substance and Sexual Activity  . Alcohol use: Yes    Alcohol/week: 12.0 standard drinks    Types: 12 Standard drinks or equivalent per week    Comment: Beer on the weekend  . Drug use: Yes    Types: Marijuana  . Sexual activity: Not on file  Lifestyle  . Physical activity:    Days per week: Not on file    Minutes per session: Not on file  . Stress: Not on file  Relationships  . Social connections:    Talks on phone: Not on file    Gets together: Not on file    Attends  religious service: Not on file    Active member of club or organization: Not on file    Attends meetings of clubs or organizations: Not on file    Relationship status: Not on file  . Intimate partner violence:    Fear of current or ex partner: Not on file    Emotionally abused: Not on file    Physically abused: Not on file    Forced sexual activity: Not on file  Other Topics Concern  . Not on file  Social History Narrative  . Not on file    The patient currently resides (home / rehab facility /  nursing home): Home The patient normally is (ambulatory / bedbound): Ambulatory  FAMILY HISTORY:  Family History  Problem Relation Age of Onset  . Emphysema Father     Otherwise negative/non-contributory.  REVIEW OF SYSTEMS:  Constitutional: denies any other weight loss, fever, chills, or sweats  Eyes: denies any other vision changes, history of eye injury  ENT: denies sore throat, hearing problems  Respiratory: denies shortness of breath, wheezing  Cardiovascular: denies chest pain, palpitations  Gastrointestinal: abdominal pain, N/V, and bowel function as per HPI Musculoskeletal: denies any other joint pains or cramps  Skin: Denies any other rashes or skin discolorations except as per HPI Neurological: denies any other headache, dizziness, weakness  Psychiatric: Denies any other depression, anxiety   All other review of systems were otherwise negative   VITAL SIGNS:  BP (!) 206/92   Pulse 73   Temp 97.9 F (36.6 C) (Temporal)   Ht 5\' 9"  (1.753 m)   Wt 195 lb 12.8 oz (88.8 kg)   BMI 28.91 kg/m   PHYSICAL EXAM:  Constitutional:  -- Normal body habitus  -- Awake, alert, and oriented x3  Eyes:  -- Pupils equally round and reactive to light  -- No scleral icterus  Ear, nose, throat:  -- No jugular venous distension -- No nasal drainage, bleeding Pulmonary:  -- No crackles  -- Equal breath sounds bilaterally -- Breathing non-labored at rest Cardiovascular:  -- S1, S2  present  -- No pericardial rubs  Gastrointestinal:  -- Abdomen soft and non-distended with RUQ > epigastric tenderness to palpation, no guarding/rebound tenderness -- No abdominal masses appreciated, pulsatile or otherwise  Musculoskeletal and Integumentary:  -- Wounds or skin discoloration: None appreciated -- Extremities: B/L UE and LE FROM, hands and feet warm, no edema  Neurologic:  -- Motor function: Intact and symmetric -- Sensation: Intact and symmetric  Labs:  CBC Latest Ref Rng & Units 07/11/2018 10/05/2017 10/03/2017  WBC 3.4 - 10.8 x10E3/uL 4.2 8.5 12.3(H)  Hemoglobin 13.0 - 17.7 g/dL 10.3(L) 7.0(L) 7.6(L)  Hematocrit 37.5 - 51.0 % 32.4(L) 22.4(L) 24.8(L)  Platelets 150 - 450 x10E3/uL 299 247 216   CMP Latest Ref Rng & Units 07/11/2018 06/17/2018 10/05/2017  Glucose 65 - 99 mg/dL 113(H) - 106(H)  BUN 8 - 27 mg/dL 9 - 13  Creatinine 0.76 - 1.27 mg/dL 1.00 1.20 0.89  Sodium 134 - 144 mmol/L 142 - 137  Potassium 3.5 - 5.2 mmol/L 4.4 - 3.9  Chloride 96 - 106 mmol/L 104 - 108  CO2 20 - 29 mmol/L 20 - 22  Calcium 8.6 - 10.2 mg/dL 9.8 - 8.4(L)  Total Protein 6.0 - 8.5 g/dL 6.8 - -  Total Bilirubin 0.0 - 1.2 mg/dL 0.3 - -  Alkaline Phos 39 - 117 IU/L 62 - -  AST 0 - 40 IU/L 11 - -  ALT 0 - 44 IU/L 10 - -   Imaging studies:  Abdominal Ultrasound (07/24/2018) There are multiple echogenic mobile gallstones,  measuring up  to 4.5 mm. There is no gallbladder wall  thickening, pericholecystic fluid, or positive sonographic  Murphy's sign. Common bile duct diameter: 3.7 mm.  Assessment/Plan: 72 y.o. male with RUQ > epigastric soreness consistent with symptomatic cholelithiasis, complicated by comorbidities including HTN (uncontrolled today), HLD, CAD s/p CABG x5 (09/2017), PAD, chronic back pain, gout, GERD, and chronic ongoing tobacco abuse (smoking).    - referred to his primary care physician's office in same building for HTN             -  avoid/minimize foods with higher fat  content (meats, cheeses/dairy, and fried)             - prefer low-fat vegetables, whole grains (wheat bread, ceareals, etc), and fruits until cholecystectomy              - all risks, benefits, and alternatives to cholecystectomy were discussed with the patient, and all of his questions were answered to his expressed satisfaction. Patient expresses he hadn't considered the possibility of surgery and is hesitant after CABG last year, but he asks office to proceed with requesting clearances.             - will request cardiac and medical risk stratification and optimization per patient's request while he considers outpatient laparoscopic cholecystectomy  - return to clinic as needed, instructed to call if any questions or concerns  All of the above recommendations were discussed with the patient, and all of patient's questions were answered to his expressed satisfaction.  Thank you for the opportunity to participate in this patient's care.  -- Marilynne Drivers Rosana Hoes, MD, [place]: [facility] - Partnering for exceptional care. Office: [phone]

## 2018-08-03 NOTE — Patient Instructions (Signed)
We will recheck you in a week. Start the fluid pill again.

## 2018-08-10 ENCOUNTER — Encounter: Payer: Self-pay | Admitting: Family Medicine

## 2018-08-10 ENCOUNTER — Ambulatory Visit: Payer: Medicare Other | Admitting: Family Medicine

## 2018-08-10 VITALS — BP 160/70 | HR 71 | Temp 98.6°F | Resp 16 | Wt 197.8 lb

## 2018-08-10 DIAGNOSIS — I10 Essential (primary) hypertension: Secondary | ICD-10-CM

## 2018-08-10 NOTE — Patient Instructions (Addendum)
Increase HCTZ to two pills daily. At next visit we will check your labs.

## 2018-08-10 NOTE — Progress Notes (Signed)
  Subjective:     Patient ID: Craig [name], male   DOB: 1945-10-14, 72 y.o.   MRN: [id number] Chief Complaint  Patient presents with  . Hypertension    Patient here today for 1 week follow up Hypertension. Patient's last BP reading in the office was 180/84 and patient was advised to resume HCTZ. Patient reports not checking BP at home. He reports no symptoms.   HPI States he has noticed less swelling in his legs. Clarifies today that he is on valsartan and not on Micardis. Still ambivalent about pursuing cholecystectomy. Discussed that I would like to check his labs at next visit especially to f/u his post-op anemia.  Review of Systems     Objective:   Physical Exam  Constitutional: He appears well-developed and well-nourished. No distress.  Cardiovascular: Normal rate and regular rhythm.  Pulmonary/Chest: Breath sounds normal.  Abdominal: Soft. Bowel sounds are normal. There is tenderness (mild epigastric).  Musculoskeletal: He exhibits no edema (of distal lower extremities).       Assessment:    1. Essential (primary) hypertension: Start two HCTZ 12.5 mg. Daily with current supply.    Plan:    Will check labs at next office visit.

## 2018-08-19 ENCOUNTER — Other Ambulatory Visit: Payer: Self-pay | Admitting: Family Medicine

## 2018-08-24 ENCOUNTER — Ambulatory Visit: Payer: Medicare Other | Admitting: Family Medicine

## 2018-08-24 ENCOUNTER — Encounter: Payer: Self-pay | Admitting: Family Medicine

## 2018-08-24 VITALS — BP 140/60 | HR 68 | Temp 97.8°F | Resp 16 | Wt 203.0 lb

## 2018-08-24 DIAGNOSIS — M79604 Pain in right leg: Secondary | ICD-10-CM

## 2018-08-24 DIAGNOSIS — I10 Essential (primary) hypertension: Secondary | ICD-10-CM

## 2018-08-24 DIAGNOSIS — E782 Mixed hyperlipidemia: Secondary | ICD-10-CM | POA: Diagnosis not present

## 2018-08-24 DIAGNOSIS — D649 Anemia, unspecified: Secondary | ICD-10-CM

## 2018-08-24 DIAGNOSIS — M79605 Pain in left leg: Secondary | ICD-10-CM

## 2018-08-24 MED ORDER — HYDROCHLOROTHIAZIDE 25 MG PO TABS: 25.0000 mg | ORAL_TABLET | Freq: Every day | ORAL | 3 refills | Status: DC

## 2018-08-24 MED ORDER — PREDNISONE 20 MG PO TABS: ORAL_TABLET | ORAL | 0 refills | Status: DC

## 2018-08-24 NOTE — Patient Instructions (Addendum)
We will call you with the lab work. Let me know if the steroids don't help your back and leg pain.

## 2018-08-24 NOTE — Progress Notes (Signed)
  Subjective:     Patient ID: Craig [name], male   DOB: 21-May-1946, 72 y.o.   MRN: [id number] Chief Complaint  Patient presents with  . Hypertension    Patient returns to office today for follow up from 08/10/18. At last visit blood pressure in office was 160/70, we increased HCTZ to two tablets daily. Patient reports good compliance and tolerance on medication.    HPI Also states when he has to walk to a deer stand he will get pain running down both the back of both legs (L >R) from his lateral hip/low back area. He states if he sits the pain goes away. Has had MRI of his lumbar spine 06/18/18 which ruled out nerve compression. Advil helps but I told him with his CAD not a good choice. Recently pain was exacerbated by hauling a deer he had shot out of the woods. Has also had prior angioplasty/stenting of his left superficial femoral artery (01/2010)  Review of Systems     Objective:   Physical Exam  Constitutional: He appears well-developed and well-nourished. No distress.  Cardiovascular: Normal rate and regular rhythm.  Pulses:      Dorsalis pedis pulses are 1+ on the left side.       Posterior tibial pulses are 1+ on the left side.  Pulmonary/Chest: Breath sounds normal.  Musculoskeletal: He exhibits no edema (distal lower extremities).  Muscle strength in lower extremities 5/5. SLR's to 90 degrees without back pain or radiation of back pain.       Assessment:    1. Essential (primary) hypertension - hydrochlorothiazide (HYDRODIURIL) 25 MG tablet; Take 1 tablet (25 mg total) by mouth daily.  Dispense: 90 tablet; Refill: 3 - Renal function panel  2. Normocytic anemia - CBC with Differential/Platelet  3. Hyperlipemia, mixed - Lipid panel  4. Bilateral leg pain - predniSONE (DELTASONE) 20 MG tablet; Taper as follows: 3 pills for 4 days, two pills for 4 days, one pill for four days  Dispense: 24 tablet; Refill: 0    Plan:    Further f/u pending lab results. Consdier vascular  referral if leg pain not improved with steroids.

## 2018-08-25 ENCOUNTER — Telehealth: Payer: Self-pay

## 2018-08-25 LAB — CBC WITH DIFFERENTIAL/PLATELET
Basophils Absolute: 0.1 10*3/uL (ref 0.0–0.2)
Basos: 2 %
EOS (ABSOLUTE): 0.3 10*3/uL (ref 0.0–0.4)
Eos: 6 %
Hematocrit: 34 % — ABNORMAL LOW (ref 37.5–51.0)
Hemoglobin: 10.7 g/dL — ABNORMAL LOW (ref 13.0–17.7)
Immature Grans (Abs): 0 10*3/uL (ref 0.0–0.1)
Immature Granulocytes: 0 %
Lymphocytes Absolute: 1.4 10*3/uL (ref 0.7–3.1)
Lymphs: 29 %
MCH: 25 pg — ABNORMAL LOW (ref 26.6–33.0)
MCHC: 31.5 g/dL (ref 31.5–35.7)
MCV: 79 fL (ref 79–97)
Monocytes Absolute: 0.4 10*3/uL (ref 0.1–0.9)
Monocytes: 9 %
Neutrophils Absolute: 2.6 10*3/uL (ref 1.4–7.0)
Neutrophils: 54 %
Platelets: 324 10*3/uL (ref 150–450)
RBC: 4.28 x10E6/uL (ref 4.14–5.80)
RDW: 14.5 % (ref 12.3–15.4)
WBC: 4.8 10*3/uL (ref 3.4–10.8)

## 2018-08-25 LAB — RENAL FUNCTION PANEL
Albumin: 4.8 g/dL (ref 3.5–4.8)
BUN/Creatinine Ratio: 13 (ref 10–24)
BUN: 16 mg/dL (ref 8–27)
CO2: 21 mmol/L (ref 20–29)
Calcium: 9.8 mg/dL (ref 8.6–10.2)
Chloride: 101 mmol/L (ref 96–106)
Creatinine, Ser: 1.23 mg/dL (ref 0.76–1.27)
GFR calc Af Amer: 68 mL/min/{1.73_m2} (ref >59)
GFR calc non Af Amer: 59 mL/min/{1.73_m2} — ABNORMAL LOW (ref >59)
Glucose: 110 mg/dL — ABNORMAL HIGH (ref 65–99)
Phosphorus: 4 mg/dL (ref 2.5–4.5)
Potassium: 5 mmol/L (ref 3.5–5.2)
Sodium: 139 mmol/L (ref 134–144)

## 2018-08-25 LAB — LIPID PANEL
Chol/HDL Ratio: 3.5 ratio (ref 0.0–5.0)
Cholesterol, Total: 179 mg/dL (ref 100–199)
HDL: 51 mg/dL (ref >39)
LDL Calculated: 106 mg/dL — ABNORMAL HIGH (ref 0–99)
Triglycerides: 110 mg/dL (ref 0–149)
VLDL Cholesterol Cal: 22 mg/dL (ref 5–40)

## 2018-08-25 NOTE — Telephone Encounter (Signed)
[email or web address] . KW 

## 2018-08-25 NOTE — Telephone Encounter (Signed)
-----   Message from Carmon Ginsberg, [place] sent at 08/25/2018  7:39 AM EST ----- Anemia improving. Cholesterol is not low enough on his current cholesterol medication. Have him return in two weeks to discuss this and his leg pain after prednisone trial.

## 2018-08-27 ENCOUNTER — Other Ambulatory Visit: Payer: Self-pay | Admitting: Family Medicine

## 2018-08-28 NOTE — Telephone Encounter (Signed)
Patient has been advised and will call back in two weeks to make follow up appt.KW

## 2018-09-01 ENCOUNTER — Ambulatory Visit: Payer: Medicare Other | Admitting: Family Medicine

## 2018-09-01 ENCOUNTER — Encounter: Payer: Self-pay | Admitting: Family Medicine

## 2018-09-01 VITALS — BP 104/60 | HR 71 | Temp 97.8°F | Resp 16 | Wt 200.6 lb

## 2018-09-01 DIAGNOSIS — K8020 Calculus of gallbladder without cholecystitis without obstruction: Secondary | ICD-10-CM | POA: Diagnosis not present

## 2018-09-01 DIAGNOSIS — R1013 Epigastric pain: Secondary | ICD-10-CM | POA: Diagnosis not present

## 2018-09-01 MED ORDER — SUCRALFATE 1 G PO TABS: 1.0000 g | ORAL_TABLET | Freq: Three times a day (TID) | ORAL | 0 refills | Status: DC

## 2018-09-01 NOTE — Progress Notes (Signed)
  Subjective:     Patient ID: Craig [name], male   DOB: 12-Apr-1946, 72 y.o.   MRN: [id number] Chief Complaint  Patient presents with  . Abdominal Pain    Patient comes in office today with complaints of upper abdominal for the past 3 days. Patient reports that he has been bloated and increased gas. Patient states passing gas relieves some of the pain. Patient states that he has concerns because he was told he had gallstones and isnt sure if it is affecting him.    HPI He has recently completed a prednisone taper too early as he was taking 80 mg. For a few days. States it did not make any difference in his leg pains. Today reports the intermittent sensation of a "ball" moving up his esophagus from his stomach. He has a known hx of gallstones but as deferred surgery. States he is moving his bowels ok and compliant with PPI.  Review of Systems States he has a headache and feels a facial rash    Objective:   Physical Exam  Constitutional: He appears well-developed and well-nourished. No distress.  Abdominal: Bowel sounds are normal. There is tenderness in the right upper quadrant and epigastric area. There is no guarding.       Assessment:    1. Epigastric pain: start Carafate: ? Secondary to prednisone  2. Calculus of gallbladder without cholecystitis without obstruction      Plan:    Call if not improving for G.I. Referral. Consider referral back to Vascular (Dr. Lucky Cowboy ) for leg pain

## 2018-09-01 NOTE — Patient Instructions (Addendum)
Call me if the medicine does not help your stomach over the next few days. Consider seeing Dr. Lucky Cowboy ( the vascular surgeon who did your leg surgery) again about your leg pain.

## 2018-09-06 ENCOUNTER — Encounter: Payer: Self-pay | Admitting: Family Medicine

## 2018-09-06 ENCOUNTER — Ambulatory Visit: Payer: Medicare Other | Admitting: Family Medicine

## 2018-09-06 VITALS — BP 120/60 | HR 81 | Temp 98.1°F | Resp 16 | Wt 197.4 lb

## 2018-09-06 DIAGNOSIS — R55 Syncope and collapse: Secondary | ICD-10-CM | POA: Diagnosis not present

## 2018-09-06 DIAGNOSIS — H539 Unspecified visual disturbance: Secondary | ICD-10-CM | POA: Diagnosis not present

## 2018-09-06 NOTE — Patient Instructions (Addendum)
Taper clonidine to one pill twice daily for a week, then one pill once daily for a week, then discontinue. Do follow up with your cardiologist, Dr. Nehemiah Massed, as scheduled next week.

## 2018-09-06 NOTE — Progress Notes (Signed)
  Subjective:     Patient ID: Craig [name], male   DOB: 1946-03-30, 72 y.o.   MRN: [id number] Chief Complaint  Patient presents with  . Fatigue    Patient comes in office today with concern of fatigue for the past 5 days. Patient states that he has not energy to do daily tasks.    HPI Reports over the last few days a variety of symptoms: near syncope, visual changes while driving ("the road surface looked white"), pruritis of his legs, rhinitis, and feeling hot and cold. He is pending cardiology f/u next week."I am falling apart." Recent CBC was improving  Review of Systems     Objective:   Physical Exam  Constitutional: He appears well-developed and well-nourished. No distress.  Neck: Carotid bruit is not present.  Cardiovascular: Normal rate and regular rhythm.  Orthostatic bp: lying: 120/60 78; standing 112/58 84  Pulmonary/Chest: Breath sounds normal.       Assessment:    1. Visual changes  2. Near syncope: will taper off clonidine pending cardiologist evaluation.      Plan:    Taper clonidine to one pill twice daily for one week then one pills daily for one week then d/c.[name] Kitchen

## 2018-09-14 DIAGNOSIS — I10 Essential (primary) hypertension: Secondary | ICD-10-CM | POA: Diagnosis not present

## 2018-09-14 DIAGNOSIS — E782 Mixed hyperlipidemia: Secondary | ICD-10-CM | POA: Diagnosis not present

## 2018-09-14 DIAGNOSIS — I119 Hypertensive heart disease without heart failure: Secondary | ICD-10-CM | POA: Diagnosis not present

## 2018-09-14 DIAGNOSIS — I25118 Atherosclerotic heart disease of native coronary artery with other forms of angina pectoris: Secondary | ICD-10-CM | POA: Diagnosis not present

## 2018-09-14 DIAGNOSIS — R0602 Shortness of breath: Secondary | ICD-10-CM | POA: Diagnosis not present

## 2018-10-23 ENCOUNTER — Ambulatory Visit: Payer: Medicare Other | Admitting: Thoracic Surgery (Cardiothoracic Vascular Surgery)

## 2018-10-23 ENCOUNTER — Encounter: Payer: Self-pay | Admitting: Thoracic Surgery (Cardiothoracic Vascular Surgery)

## 2018-10-23 ENCOUNTER — Other Ambulatory Visit: Payer: Self-pay

## 2018-10-23 VITALS — BP 148/76 | HR 83 | Resp 16 | Ht 69.0 in | Wt 200.0 lb

## 2018-10-23 DIAGNOSIS — Z951 Presence of aortocoronary bypass graft: Secondary | ICD-10-CM | POA: Diagnosis not present

## 2018-10-23 NOTE — Progress Notes (Signed)
[address]       [place] [address]             [phone]     CARDIOTHORACIC SURGERY OFFICE NOTE  Referring Provider is Corey Skains, MD PCP is Carmon Ginsberg, [place]   HPI:  Patient is a 73 year old African-American male with history of hypertension, hyperlipidemia, and long-standing tobacco abuse who returns to the office today for routine follow-up status post coronary artery bypass grafting x5 on September 30, 2017 for severe three-vessel coronary artery disease with stable angina pectoris.  The patient's postoperative recovery was uneventful and he was last seen here in our office on January 23, 2018 at which time he was doing well.  Since then he has been followed carefully by Dr. Nehemiah Massed who saw him last month.  The patient reports no significant problems from a cardiovascular standpoint.  He complains that he was recently found to have gallstones and he is debating about whether to proceed with elective cholecystectomy.  He is not smoking.  He denies any symptoms of exertional shortness of breath or chest pain.  He states that he is not quite as active as he used to be, but overall he feels better than he did prior to his surgery.   Current Outpatient Medications  Medication Sig Dispense Refill  . acetaminophen (TYLENOL) 500 MG tablet Take 2 tablets (1,000 mg total) by mouth every 6 (six) hours. 30 tablet 0  . aspirin EC 81 MG tablet Take 81 mg by mouth daily.    . carvedilol (COREG) 6.25 MG tablet Take 6.25 mg by mouth 2 (two) times daily with a meal.  3  . diltiazem (CARDIZEM CD) 300 MG 24 hr capsule TAKE 1 CAPSULE (300 MG TOTAL) BY MOUTH DAILY. 90 capsule 3  . gabapentin (NEURONTIN) 300 MG capsule TAKE 2 CAPSULES BY MOUTH 3 TIMES A DAY 180 capsule 5  . hydrochlorothiazide (HYDRODIURIL) 25 MG tablet Take 1 tablet (25 mg total) by mouth daily. 90 tablet 3  . latanoprost (XALATAN) 0.005 % ophthalmic solution INSTILL ONE DROP IN BOTH EYES AT BEDTIME.  3    . Multiple Vitamin (MULTIVITAMIN WITH MINERALS) TABS tablet Take 1 tablet by mouth daily.    . OMEGA-3 FATTY ACIDS PO Take 1 capsule daily by mouth.     [name] Kitchen omeprazole (PRILOSEC) 40 MG capsule TAKE 1 CAPSULE BY MOUTH EVERY DAY 90 capsule 1  . pravastatin (PRAVACHOL) 80 MG tablet Take 1 tablet (80 mg total) by mouth daily. 90 tablet 3  . sucralfate (CARAFATE) 1 g tablet Take 1 tablet (1 g total) by mouth 4 (four) times daily -  with meals and at bedtime. 28 tablet 0  . triamcinolone cream (KENALOG) 0.1 % Apply 1 application topically 2 (two) times daily as needed (rash on ear).    . valsartan (DIOVAN) 160 MG tablet Take 160 mg by mouth daily.     No current facility-administered medications for this visit.       Physical Exam:   BP (!) 148/76 (BP Location: Right Arm, Patient Position: Sitting, Cuff Size: Large)   Pulse 83   Resp 16   Ht 5\' 9"  (1.753 m)   Wt 200 lb (90.7 kg)   SpO2 98% Comment: ON RA  BMI 29.53 kg/m   General:  Well-appearing  Chest:   Clear to auscultation  CV:   Regular rate and rhythm without murmur  Incisions:  Completely healed  Abdomen:  Soft nontender  Extremities:  Warm and well-perfused  Diagnostic Tests:  n/a   Impression:  Patient is doing well more than 1 year status post coronary artery bypass grafting  Plan:  We have not recommended any changes to the patient's current medications.  I have reminded the patient that his blood pressure has been at least intermittently challenging to control, and he will need to continue to monitor this carefully and discuss any further changes in his medical therapy with Dr. Jose Persia or his primary care physician.  I have encouraged the patient to get some sort of exercise on a regular basis and to continue to maintain a heart healthy diet.  All of his questions been addressed.  In the future he will call and return to see [place] as needed.  I spent in excess of 10 minutes during the conduct of this office consultation  and >50% of this time involved direct face-to-face encounter with the patient for counseling and/or coordination of their care.    Valentina Gu. Roxy Manns, MD 10/23/2018 12:04 PM

## 2018-10-23 NOTE — Patient Instructions (Addendum)
Continue all previous medications without any changes at this time  Continue to monitor your blood pressure and discuss medical treatment with your cardiologist and primary care physician  Make every effort to stay physically active, get some type of exercise on a regular basis, and stick to a "heart healthy diet".  The long term benefits for regular exercise and a healthy diet are critically important to your overall health and wellbeing.

## 2018-10-30 DIAGNOSIS — H401121 Primary open-angle glaucoma, left eye, mild stage: Secondary | ICD-10-CM | POA: Diagnosis not present

## 2018-10-31 ENCOUNTER — Encounter: Payer: Self-pay | Admitting: Surgery

## 2018-10-31 ENCOUNTER — Other Ambulatory Visit: Payer: Self-pay

## 2018-10-31 ENCOUNTER — Ambulatory Visit: Payer: Medicare Other | Admitting: Surgery

## 2018-10-31 DIAGNOSIS — K8020 Calculus of gallbladder without cholecystitis without obstruction: Secondary | ICD-10-CM

## 2018-10-31 NOTE — H&P (View-Only) (Signed)
Surgical Clinic Progress/Follow-up Note   HPI:  73 y.o. Male presents to clinic for follow-up evaluation and to schedule cholecystectomy. Patient reports he continues to occasionally experience epigastric > RUQ abdominal pain, but less frequently since he essentially stopped eating cheeseburgers and similarly fatty foods except once when he tried eating 2 cheeseburgers to see what would happen with no worsening of his pain on that particular occasion. Nonetheless, he says that a friend of his refused cholecystectomy and then spent 2 weeks in the hospital following emergent cholecystectomy, and he expresses anxiety about that possibility for him, requests to proceed with cholecystectomy accordingly. He also expresses some concern regarding his upper abdominal rectus diastasis ("football") that was discussed also at his prior appointment this past October. Patient otherwise denies any N/V, fever/chills, CP, or SOB and says his cardiac surgeon "set [him] free" from further follow-up at his appointment last week.  Review of Systems:  Constitutional: denies any other weight loss, fever, chills, or sweats  Eyes: denies any other vision changes, history of eye injury  ENT: denies sore throat, hearing problems  Respiratory: denies shortness of breath, wheezing  Cardiovascular: denies chest pain, palpitations  Gastrointestinal: abdominal pain, N/V, and bowel function as per HPI Musculoskeletal: denies any other joint pains or cramps  Skin: Denies any other rashes or skin discolorations  Neurological: denies any other headache, dizziness, weakness  Psychiatric: denies any other depression, anxiety  All other review of systems: otherwise negative   Vital Signs:  BP (!) 173/84   Pulse 82   Temp 97.9 F (36.6 C) (Skin)   Resp 18   Ht 5\' 9"  (1.753 m)   Wt 204 lb (92.5 kg)   SpO2 96%   BMI 30.13 kg/m    Physical Exam:  Constitutional:  -- Normal body habitus  -- Awake, alert, and oriented x3   Eyes:  -- Pupils equally round and reactive to light  -- No scleral icterus  Ear, nose, throat:  -- No jugular venous distension  -- No nasal drainage, bleeding Pulmonary:  -- No crackles -- Equal breath sounds bilaterally -- Breathing non-labored at rest Cardiovascular:  -- S1, S2 present  -- No pericardial rubs  Gastrointestinal:  -- Abdomen soft and non-distended with mild RUQ > epigastric tenderness to palpation, no guarding/rebound tenderness  -- No abdominal masses appreciated, pulsatile or otherwise  Musculoskeletal / Integumentary:  -- Wounds or skin discoloration: None appreciated  -- Extremities: B/L UE and LE FROM, hands and feet warm, no edema  Neurologic:  -- Motor function: intact and symmetric  -- Sensation: intact and symmetric   Laboratory studies:  CBC Latest Ref Rng & Units 08/24/2018 07/11/2018 10/05/2017  WBC 3.4 - 10.8 x10E3/uL 4.8 4.2 8.5  Hemoglobin 13.0 - 17.7 g/dL 10.7(L) 10.3(L) 7.0(L)  Hematocrit 37.5 - 51.0 % 34.0(L) 32.4(L) 22.4(L)  Platelets 150 - 450 x10E3/uL 324 299 247   CMP Latest Ref Rng & Units 08/24/2018 07/11/2018 06/17/2018  Glucose 65 - 99 mg/dL 110(H) 113(H) -  BUN 8 - 27 mg/dL 16 9 -  Creatinine 0.76 - 1.27 mg/dL 1.23 1.00 1.20  Sodium 134 - 144 mmol/L 139 142 -  Potassium 3.5 - 5.2 mmol/L 5.0 4.4 -  Chloride 96 - 106 mmol/L 101 104 -  CO2 20 - 29 mmol/L 21 20 -  Calcium 8.6 - 10.2 mg/dL 9.8 9.8 -  Total Protein 6.0 - 8.5 g/dL - 6.8 -  Total Bilirubin 0.0 - 1.2 mg/dL - 0.3 -  Alkaline Phos  39 - 117 IU/L - 62 -  AST 0 - 40 IU/L - 11 -  ALT 0 - 44 IU/L - 10 -   Imaging:  Abdominal Ultrasound (07/24/2018) There are multiple echogenic mobile gallstones,  measuring up  to 4.5 mm. There is no gallbladder wall  thickening, pericholecystic fluid, or positive sonographic  Murphy's sign. Common bile duct diameter: 3.7 mm.  Assessment:  73 y.o. yo Male with a problem list including...  Patient Active Problem List   Diagnosis Date  Noted  . Calculus of gallbladder without cholecystitis without obstruction 08/03/2018  . LVH (left ventricular hypertrophy) due to hypertensive disease, without heart failure 01/04/2018  . 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 Anemia   . Coronary artery disease involving native coronary artery of native heart   . Spondylolisthesis, lumbar region 03/16/2017  . Hyperlipemia, mixed 09/10/2015  . 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 Essential (primary) hypertension 09/10/2015  . Degeneration of cervical intervertebral disc 09/10/2015  . Allergic rhinitis 09/10/2015  . GERD (gastroesophageal reflux disease) 06/25/2015  . Cervical stenosis of spinal canal 06/25/2015  . Peripheral vascular disease ([place]) 06/25/2015    72 y.o. malewith epigastric and RUQ soreness consistent with symptomatic cholelithiasis, complicated by comorbidities including HTN (again poorly controlled today), HLD, CAD s/p CABG x5 (09/2017), PAD, chronic back pain, gout, GERD, and chronic ongoing tobacco abuse (smoking).  Plan:  - avoid/minimize foods with higher fat content (meats, cheeses/dairy, and fried) - prefer low-fat vegetables, whole grains (wheat bread, ceareals, etc), and fruits until cholecystectomy - all risks, benefits, and alternatives tocholecystectomywere discussed with the patient, and all of his questions were answered to his expressed satisfaction. Patient expresses he wishes to proceed asap, and informed consent was obtained accordingly. - will confirm cardiac and medical risk stratification and optimization, including optimized HTN management, and plan for laparoscopic cholecystectomy pending the above, anesthesia, and OR availability             - anticipate return to clinic 2 weeks following above elective surgery  - instructed to call if any questions or concerns  All of the above recommendations were discussed with the patient, and all of patient's  questions were answered to his expressed satisfaction.  -- Marilynne Drivers Rosana Hoes, MD, [place]: [facility] - Partnering for exceptional care. Office: [phone]

## 2018-10-31 NOTE — Progress Notes (Signed)
Surgical Clinic Progress/Follow-up Note   HPI:  73 y.o. Male presents to clinic for follow-up evaluation and to schedule cholecystectomy. Patient reports he continues to occasionally experience epigastric > RUQ abdominal pain, but less frequently since he essentially stopped eating cheeseburgers and similarly fatty foods except once when he tried eating 2 cheeseburgers to see what would happen with no worsening of his pain on that particular occasion. Nonetheless, he says that a friend of his refused cholecystectomy and then spent 2 weeks in the hospital following emergent cholecystectomy, and he expresses anxiety about that possibility for him, requests to proceed with cholecystectomy accordingly. He also expresses some concern regarding his upper abdominal rectus diastasis ("football") that was discussed also at his prior appointment this past October. Patient otherwise denies any N/V, fever/chills, CP, or SOB and says his cardiac surgeon "set [him] free" from further follow-up at his appointment last week.  Review of Systems:  Constitutional: denies any other weight loss, fever, chills, or sweats  Eyes: denies any other vision changes, history of eye injury  ENT: denies sore throat, hearing problems  Respiratory: denies shortness of breath, wheezing  Cardiovascular: denies chest pain, palpitations  Gastrointestinal: abdominal pain, N/V, and bowel function as per HPI Musculoskeletal: denies any other joint pains or cramps  Skin: Denies any other rashes or skin discolorations  Neurological: denies any other headache, dizziness, weakness  Psychiatric: denies any other depression, anxiety  All other review of systems: otherwise negative   Vital Signs:  BP (!) 173/84   Pulse 82   Temp 97.9 F (36.6 C) (Skin)   Resp 18   Ht 5\' 9"  (1.753 m)   Wt 204 lb (92.5 kg)   SpO2 96%   BMI 30.13 kg/m    Physical Exam:  Constitutional:  -- Normal body habitus  -- Awake, alert, and oriented x3   Eyes:  -- Pupils equally round and reactive to light  -- No scleral icterus  Ear, nose, throat:  -- No jugular venous distension  -- No nasal drainage, bleeding Pulmonary:  -- No crackles -- Equal breath sounds bilaterally -- Breathing non-labored at rest Cardiovascular:  -- S1, S2 present  -- No pericardial rubs  Gastrointestinal:  -- Abdomen soft and non-distended with mild RUQ > epigastric tenderness to palpation, no guarding/rebound tenderness  -- No abdominal masses appreciated, pulsatile or otherwise  Musculoskeletal / Integumentary:  -- Wounds or skin discoloration: None appreciated  -- Extremities: B/L UE and LE FROM, hands and feet warm, no edema  Neurologic:  -- Motor function: intact and symmetric  -- Sensation: intact and symmetric   Laboratory studies:  CBC Latest Ref Rng & Units 08/24/2018 07/11/2018 10/05/2017  WBC 3.4 - 10.8 x10E3/uL 4.8 4.2 8.5  Hemoglobin 13.0 - 17.7 g/dL 10.7(L) 10.3(L) 7.0(L)  Hematocrit 37.5 - 51.0 % 34.0(L) 32.4(L) 22.4(L)  Platelets 150 - 450 x10E3/uL 324 299 247   CMP Latest Ref Rng & Units 08/24/2018 07/11/2018 06/17/2018  Glucose 65 - 99 mg/dL 110(H) 113(H) -  BUN 8 - 27 mg/dL 16 9 -  Creatinine 0.76 - 1.27 mg/dL 1.23 1.00 1.20  Sodium 134 - 144 mmol/L 139 142 -  Potassium 3.5 - 5.2 mmol/L 5.0 4.4 -  Chloride 96 - 106 mmol/L 101 104 -  CO2 20 - 29 mmol/L 21 20 -  Calcium 8.6 - 10.2 mg/dL 9.8 9.8 -  Total Protein 6.0 - 8.5 g/dL - 6.8 -  Total Bilirubin 0.0 - 1.2 mg/dL - 0.3 -  Alkaline Phos  39 - 117 IU/L - 62 -  AST 0 - 40 IU/L - 11 -  ALT 0 - 44 IU/L - 10 -   Imaging:  Abdominal Ultrasound (07/24/2018) There are multiple echogenic mobile gallstones,  measuring up  to 4.5 mm. There is no gallbladder wall  thickening, pericholecystic fluid, or positive sonographic  Murphy's sign. Common bile duct diameter: 3.7 mm.  Assessment:  73 y.o. yo Male with a problem list including...  Patient Active Problem List   Diagnosis Date  Noted  . Calculus of gallbladder without cholecystitis without obstruction 08/03/2018  . LVH (left ventricular hypertrophy) due to hypertensive disease, without heart failure 01/04/2018  . 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 Anemia   . Coronary artery disease involving native coronary artery of native heart   . Spondylolisthesis, lumbar region 03/16/2017  . Hyperlipemia, mixed 09/10/2015  . 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 Essential (primary) hypertension 09/10/2015  . Degeneration of cervical intervertebral disc 09/10/2015  . Allergic rhinitis 09/10/2015  . GERD (gastroesophageal reflux disease) 06/25/2015  . Cervical stenosis of spinal canal 06/25/2015  . Peripheral vascular disease ([place]) 06/25/2015    72 y.o. malewith epigastric and RUQ soreness consistent with symptomatic cholelithiasis, complicated by comorbidities including HTN (again poorly controlled today), HLD, CAD s/p CABG x5 (09/2017), PAD, chronic back pain, gout, GERD, and chronic ongoing tobacco abuse (smoking).  Plan:  - avoid/minimize foods with higher fat content (meats, cheeses/dairy, and fried) - prefer low-fat vegetables, whole grains (wheat bread, ceareals, etc), and fruits until cholecystectomy - all risks, benefits, and alternatives tocholecystectomywere discussed with the patient, and all of his questions were answered to his expressed satisfaction. Patient expresses he wishes to proceed asap, and informed consent was obtained accordingly. - will confirm cardiac and medical risk stratification and optimization, including optimized HTN management, and plan for laparoscopic cholecystectomy pending the above, anesthesia, and OR availability             - anticipate return to clinic 2 weeks following above elective surgery  - instructed to call if any questions or concerns  All of the above recommendations were discussed with the patient, and all of patient's  questions were answered to his expressed satisfaction.  -- Marilynne Drivers Rosana Hoes, MD, [place]: [facility] - Partnering for exceptional care. Office: [phone]

## 2018-10-31 NOTE — Patient Instructions (Addendum)
The patient is aware to call back for any questions or new concerns. No lifting over 15-20 pounds for 2 weeks after surgery  Laparoscopic Cholecystectomy Laparoscopic cholecystectomy is surgery to remove the gallbladder. The gallbladder is a pear-shaped organ that lies beneath the liver on the right side of the body. The gallbladder stores bile, which is a fluid that helps the body to digest fats. Cholecystectomy is often done for inflammation of the gallbladder (cholecystitis). This condition is usually caused by a buildup of gallstones (cholelithiasis) in the gallbladder. Gallstones can block the flow of bile, which can result in inflammation and pain. In severe cases, emergency surgery may be required. This procedure is done though small incisions in your abdomen (laparoscopic surgery). A thin scope with a camera (laparoscope) is inserted through one incision. Thin surgical instruments are inserted through the other incisions. In some cases, a laparoscopic procedure may be turned into a type of surgery that is done through a larger incision (open surgery). Tell a health care provider about:  Any allergies you have.  All medicines you are taking, including vitamins, herbs, eye drops, creams, and over-the-counter medicines.  Any problems you or family members have had with anesthetic medicines.  Any blood disorders you have.  Any surgeries you have had.  Any medical conditions you have.  Whether you are pregnant or may be pregnant. What are the risks? Generally, this is a safe procedure. However, problems may occur, including:  Infection.  Bleeding.  Allergic reactions to medicines.  Damage to other structures or organs.  A stone remaining in the common bile duct. The common bile duct carries bile from the gallbladder into the small intestine.  A bile leak from the cyst duct that is clipped when your gallbladder is removed. What happens before the procedure? Staying  hydrated Follow instructions from your health care provider about hydration, which may include:  Up to 2 hours before the procedure - you may continue to drink clear liquids, such as water, clear fruit juice, black coffee, and plain tea. Eating and drinking restrictions Follow instructions from your health care provider about eating and drinking, which may include:  8 hours before the procedure - stop eating heavy meals or foods such as meat, fried foods, or fatty foods.  6 hours before the procedure - stop eating light meals or foods, such as toast or cereal.  6 hours before the procedure - stop drinking milk or drinks that contain milk.  2 hours before the procedure - stop drinking clear liquids. Medicines  Ask your health care provider about: ? Changing or stopping your regular medicines. This is especially important if you are taking diabetes medicines or blood thinners. ? Taking medicines such as aspirin and ibuprofen. These medicines can thin your blood. Do not take these medicines before your procedure if your health care provider instructs you not to.  You may be given antibiotic medicine to help prevent infection. General instructions  Let your health care provider know if you develop a cold or an infection before surgery.  Plan to have someone take you home from the hospital or clinic.  Ask your health care provider how your surgical site will be marked or identified. What happens during the procedure?   To reduce your risk of infection: ? Your health care team will wash or sanitize their hands. ? Your skin will be washed with soap. ? Hair may be removed from the surgical area.  An IV tube may be inserted into one  of your veins.  You will be given one or more of the following: ? A medicine to help you relax (sedative). ? A medicine to make you fall asleep (general anesthetic).  A breathing tube will be placed in your mouth.  Your surgeon will make several small  cuts (incisions) in your abdomen.  The laparoscope will be inserted through one of the small incisions. The camera on the laparoscope will send images to a TV screen (monitor) in the operating room. This lets your surgeon see inside your abdomen.  Air-like gas will be pumped into your abdomen. This will expand your abdomen to give the surgeon more room to perform the surgery.  Other tools that are needed for the procedure will be inserted through the other incisions. The gallbladder will be removed through one of the incisions.  Your common bile duct may be examined. If stones are found in the common bile duct, they may be removed.  After your gallbladder has been removed, the incisions will be closed with stitches (sutures), staples, or skin glue.  Your incisions may be covered with a bandage (dressing). The procedure may vary among health care providers and hospitals. What happens after the procedure?  Your blood pressure, heart rate, breathing rate, and blood oxygen level will be monitored until the medicines you were given have worn off.  You will be given medicines as needed to control your pain.  Do not drive for 24 hours if you were given a sedative. This information is not intended to replace advice given to you by your health care provider. Make sure you discuss any questions you have with your health care provider. Document Released: 09/27/2005 Document Revised: 08/25/2017 Document Reviewed: 03/15/2016 Elsevier Interactive Patient Education  2019 [place] are scheduled for surgery at [facility] with Dr Rosana Hoes on 11/13/18. You will pre admit at the hospital prior and we will call you with that date and time. Please refer to your surgery instructions sheet.

## 2018-11-01 ENCOUNTER — Telehealth: Payer: Self-pay | Admitting: Family Medicine

## 2018-11-01 ENCOUNTER — Telehealth: Payer: Self-pay

## 2018-11-01 ENCOUNTER — Other Ambulatory Visit: Payer: Self-pay | Admitting: Family Medicine

## 2018-11-01 DIAGNOSIS — Z01818 Encounter for other preprocedural examination: Secondary | ICD-10-CM

## 2018-11-01 NOTE — Telephone Encounter (Signed)
Patient notified of pre admit date and time.  We have received the Cardiac Clearance from Dr Nehemiah Massed.

## 2018-11-01 NOTE — Telephone Encounter (Signed)
Craig [name] from [place] states that they already have cardiac clearance from Dr Nehemiah Massed so referral to him is not needed.She states they are needing general clearance from PCP.Phone [phone]

## 2018-11-01 NOTE — Telephone Encounter (Signed)
Clearance requests have been faxed to Dr Serafina Royals for Cardiac Clearance and also to Dr Carmon Ginsberg for Medical Clearance.

## 2018-11-01 NOTE — Telephone Encounter (Signed)
Message left for the patient to call about his pre admit date and time.   The patient will pre admit at the hospital in the [place] on 11/06/18 at 8:00 am.

## 2018-11-01 NOTE — Telephone Encounter (Signed)
Per bob he will be sending patient to cardiologist for medical clearance. Left voicemail on patients phone to call our office back. KW

## 2018-11-01 NOTE — Telephone Encounter (Signed)
[facility] calling to confirm a we received a surgical clearance on pt and status.  Please call Hoyle Sauer  [phone].  Thanks, [employer]

## 2018-11-02 NOTE — Telephone Encounter (Signed)
Patient had medical clearance thru cardiology scheduled appt for patient to see Mikki Santee to follow up. KW

## 2018-11-02 NOTE — Telephone Encounter (Signed)
Ok. Thanks. We will  Schedule him to see me as well.

## 2018-11-03 ENCOUNTER — Encounter: Payer: Self-pay | Admitting: Family Medicine

## 2018-11-03 ENCOUNTER — Other Ambulatory Visit: Payer: Self-pay

## 2018-11-03 ENCOUNTER — Ambulatory Visit (INDEPENDENT_AMBULATORY_CARE_PROVIDER_SITE_OTHER): Payer: Medicare Other | Admitting: Family Medicine

## 2018-11-03 VITALS — BP 158/74 | HR 73 | Temp 97.4°F | Ht 69.0 in | Wt 206.2 lb

## 2018-11-03 DIAGNOSIS — L853 Xerosis cutis: Secondary | ICD-10-CM | POA: Diagnosis not present

## 2018-11-03 DIAGNOSIS — Z01818 Encounter for other preprocedural examination: Secondary | ICD-10-CM

## 2018-11-03 DIAGNOSIS — I10 Essential (primary) hypertension: Secondary | ICD-10-CM | POA: Diagnosis not present

## 2018-11-03 NOTE — Patient Instructions (Addendum)
Increase Valsartan 160 mg to twice daily. Follow up with [place] after surgery. Stop Zest soap and use a moisturizing soap like Dove. Use a moisturizing cream like Eucerin or Cetaphil (no fragrance) after your bath and one other time daily.

## 2018-11-03 NOTE — Progress Notes (Signed)
  Subjective:     Patient ID: Craig [name], male   DOB: 09-10-1946, 73 y.o.   MRN: [id number] Chief Complaint  Patient presents with  . review for medical clearance    for gallstones   HPI He has already received cardiology clearance. Reports he has dry, itchy skin and wishes further treatment. Uses Zest soap and has gas heat in his home. Review of Systems     Objective:   Physical Exam Constitutional:      General: He is not in acute distress.    Appearance: He is not ill-appearing.  Cardiovascular:     Rate and Rhythm: Normal rate and regular rhythm.  Pulmonary:     Effort: Pulmonary effort is normal.     Breath sounds: Normal breath sounds.  Musculoskeletal:     Right lower leg: No edema.     Left lower leg: No edema.  Skin:    Comments: Dry,ashy, appearing on his upper arms.  Neurological:     Mental Status: He is alert.        Assessment:    1. Preoperative clearance: will send in note with clearance form today  2. Essential (primary) hypertension: increase Valsartan 160 mg to twice daily with current supply  3. Dry skin    Plan:    F/u with [place] after surgery to recheck his bp. Discussed use of moisturizers and switching to a [employer].

## 2018-11-06 ENCOUNTER — Other Ambulatory Visit: Payer: Self-pay

## 2018-11-06 ENCOUNTER — Encounter
Admission: RE | Admit: 2018-11-06 | Discharge: 2018-11-06 | Disposition: A | Discharge status: Home / Self Care | Payer: Medicare Other | Source: Ambulatory Visit | Attending: Surgery | Admitting: Surgery

## 2018-11-06 DIAGNOSIS — Z01818 Encounter for other preprocedural examination: Secondary | ICD-10-CM | POA: Diagnosis not present

## 2018-11-06 DIAGNOSIS — I10 Essential (primary) hypertension: Secondary | ICD-10-CM | POA: Diagnosis not present

## 2018-11-06 DIAGNOSIS — I2510 Atherosclerotic heart disease of native coronary artery without angina pectoris: Secondary | ICD-10-CM | POA: Diagnosis not present

## 2018-11-06 DIAGNOSIS — R9431 Abnormal electrocardiogram [ECG] [EKG]: Secondary | ICD-10-CM | POA: Insufficient documentation

## 2018-11-06 HISTORY — DX: Heart failure, unspecified: I50.9

## 2018-11-06 HISTORY — DX: Dyspnea, unspecified: R06.00

## 2018-11-06 LAB — COMPREHENSIVE METABOLIC PANEL
ALT: 13 U/L (ref 0–44)
AST: 16 U/L (ref 15–41)
Albumin: 4.1 g/dL (ref 3.5–5.0)
Alkaline Phosphatase: 47 U/L (ref 38–126)
Anion gap: 7 (ref 5–15)
BUN: 15 mg/dL (ref 8–23)
CO2: 26 mmol/L (ref 22–32)
Calcium: 9.6 mg/dL (ref 8.9–10.3)
Chloride: 105 mmol/L (ref 98–111)
Creatinine, Ser: 1.08 mg/dL (ref 0.61–1.24)
GFR calc Af Amer: >60 mL/min (ref >60)
GFR calc non Af Amer: >60 mL/min (ref >60)
Glucose, Bld: 124 mg/dL — ABNORMAL HIGH (ref 70–99)
Potassium: 4.2 mmol/L (ref 3.5–5.1)
Sodium: 138 mmol/L (ref 135–145)
Total Bilirubin: 0.4 mg/dL (ref 0.3–1.2)
Total Protein: 8.1 g/dL (ref 6.5–8.1)

## 2018-11-06 LAB — CBC WITH DIFFERENTIAL/PLATELET
Abs Immature Granulocytes: 0.03 10*3/uL (ref 0.00–0.07)
Basophils Absolute: 0.1 10*3/uL (ref 0.0–0.1)
Basophils Relative: 1 %
Eosinophils Absolute: 0.2 10*3/uL (ref 0.0–0.5)
Eosinophils Relative: 4 %
HCT: 35 % — ABNORMAL LOW (ref 39.0–52.0)
Hemoglobin: 10.5 g/dL — ABNORMAL LOW (ref 13.0–17.0)
Immature Granulocytes: 1 %
Lymphocytes Relative: 25 %
Lymphs Abs: 1.3 10*3/uL (ref 0.7–4.0)
MCH: 25.5 pg — ABNORMAL LOW (ref 26.0–34.0)
MCHC: 30 g/dL (ref 30.0–36.0)
MCV: 85 fL (ref 80.0–100.0)
Monocytes Absolute: 0.4 10*3/uL (ref 0.1–1.0)
Monocytes Relative: 8 %
Neutro Abs: 3.3 10*3/uL (ref 1.7–7.7)
Neutrophils Relative %: 61 %
Platelets: 325 10*3/uL (ref 150–400)
RBC: 4.12 MIL/uL — ABNORMAL LOW (ref 4.22–5.81)
RDW: 15.7 % — ABNORMAL HIGH (ref 11.5–15.5)
WBC: 5.3 10*3/uL (ref 4.0–10.5)
nRBC: 0 % (ref 0.0–0.2)

## 2018-11-06 NOTE — Pre-Procedure Instructions (Signed)
Patient stated that he drinks at least 6 or 7 cans of [facility] each day.  He does not think he could drink less than that. Encouraged him to drink more water.

## 2018-11-06 NOTE — Patient Instructions (Addendum)
Your procedure is scheduled on: Monday, November 13, 2018  Report to [facility]    DO NOT STOP ON THE FIRST FLOOR TO REGISTER  To find out your arrival time please call [phone] between 1PM - 3PM on Friday, November 10, 2018  Remember: Instructions that are not followed completely may result in serious medical risk,  up to and including death, or upon the discretion of your surgeon and anesthesiologist your  surgery may need to be rescheduled.     _X__ 1. Do not eat food after midnight the night before your procedure.                 No gum chewing or hard candies.                     ABSOLUTELY NOTHING SOLID IN YOUR MOUTH AFTER MIDNIGHT                  You may drink clear liquids up to 2 hours before you are scheduled to arrive for your surgery-                   DO not drink clear liquids within 2 hours of the start of your surgery.                   Clear Liquids include:  water, apple juice without pulp, clear carbohydrate                 drink such as Clearfast of Gatorade, Black Coffee or Tea (Do not add                 anything to coffee or tea).  __X__2.  On the morning of surgery brush your teeth with toothpaste and water,                    You may rinse your mouth with mouthwash if you wish.                      Do not swallow any toothpaste of mouthwash.     _X__ 3.  No Alcohol for 24 hours before or after surgery.   _X__ 4.  Do Not Smoke or use e-cigarettes For 24 Hours Prior to Your Surgery.                 Do not use any chewable tobacco products for at least 6 hours prior to                 surgery.  ____  5.  Bring all medications with you on the day of surgery if instructed.   ____  6.  Notify your doctor if there is any change in your medical condition      (cold, fever, infections).     Do not wear jewelry, make-up, hairpins, clips or nail polish. Do not wear lotions, powders, or perfumes. You may wear  deodorant. Do not shave 48 hours prior to surgery. Men may shave face and neck. Do not bring valuables to the hospital.    [facility] is not responsible for any belongings or valuables.  Contacts, dentures or bridgework may not be worn into surgery. Leave your suitcase in the car. After surgery it may be brought to your room. For patients admitted to the hospital, discharge time is determined by your treatment team.   Patients  discharged the day of surgery will not be allowed to drive home.   Please read over the following fact sheets that you were given:   PREPARING FOR SURGERY  _X___ Take these medicines the morning of surgery with A SIP OF WATER:    1. CARVEDILOL/COREG  2. CARDIZEM  3. GABAPENTIN  4. PRILOSEC  5. CARAFATE   ____ Fleet Enema (as directed)   __X__ Use CHG Soap as directed  __X__ Stop ASPIRIN AS OF TODAY  __X_ Stop Anti-inflammatories AS OF TODAY              THIS INCLUDES IBUPROFEN / MOTRIN / ADVIL / ALEVE                    TYLENOL IS OKAY TO TAKE AT ANY TIME.   __X__ Stop supplements until after surgery.                 FISH OIL / MULTIVITAMINS  ____ Bring C-Pap to the hospital.   CONTINUE TAKING VALSARTAN AND HYDROCHLOROTHIAZIDE AS USUAL     BUT DO NOT TAKE EITHER OF THESE ON THE DAY OF SURGERY.  CONTINUE YOUR NIGHTTIME MEDICINES AS USUAL.  HAVE STOOL SOFTENERS AVAILABLE TO USE ONCE HOME FROM SURGERY  WEAR SOMETHING LOOSE AND COMFORTABLE TO [place].

## 2018-11-07 ENCOUNTER — Ambulatory Visit: Payer: Self-pay | Admitting: Family Medicine

## 2018-11-08 DIAGNOSIS — H401132 Primary open-angle glaucoma, bilateral, moderate stage: Secondary | ICD-10-CM | POA: Diagnosis not present

## 2018-11-10 ENCOUNTER — Other Ambulatory Visit: Payer: Self-pay | Admitting: Family Medicine

## 2018-11-10 ENCOUNTER — Telehealth: Payer: Self-pay

## 2018-11-10 NOTE — Telephone Encounter (Signed)
Called pt back and spoke with Mikki Santee to see what pt was being called for.  Couldn't find any notes of why pt was called.  dbs

## 2018-11-10 NOTE — Telephone Encounter (Signed)
Patient called office stating that he was returning CMAs call, he states that she left voicemail to return call. KW

## 2018-11-10 NOTE — Telephone Encounter (Signed)
Pt needing a call back at [phone].  Thanks, [employer]

## 2018-11-12 MED ORDER — CEFAZOLIN SODIUM-DEXTROSE 2-4 GM/100ML-% IV SOLN
2.0000 g | INTRAVENOUS | Status: AC
  Administered 2018-11-13: 2 g via INTRAVENOUS

## 2018-11-13 ENCOUNTER — Other Ambulatory Visit: Payer: Self-pay

## 2018-11-13 ENCOUNTER — Ambulatory Visit: Payer: Medicare Other | Admitting: Certified Registered"

## 2018-11-13 ENCOUNTER — Encounter
Admission: RE | Disposition: A | Discharge status: Home / Self Care | Payer: Self-pay | Source: Home / Self Care | Attending: Surgery

## 2018-11-13 ENCOUNTER — Encounter: Payer: Self-pay | Admitting: *Deleted

## 2018-11-13 ENCOUNTER — Ambulatory Visit
Admission: RE | Admit: 2018-11-13 | Discharge: 2018-11-13 | Disposition: A | Discharge status: Home / Self Care | Payer: Medicare Other | Attending: Surgery | Admitting: Surgery

## 2018-11-13 DIAGNOSIS — D649 Anemia, unspecified: Secondary | ICD-10-CM | POA: Insufficient documentation

## 2018-11-13 DIAGNOSIS — I739 Peripheral vascular disease, unspecified: Secondary | ICD-10-CM | POA: Insufficient documentation

## 2018-11-13 DIAGNOSIS — M4316 Spondylolisthesis, lumbar region: Secondary | ICD-10-CM | POA: Diagnosis not present

## 2018-11-13 DIAGNOSIS — Z6830 Body mass index (BMI) 30.0-30.9, adult: Secondary | ICD-10-CM | POA: Insufficient documentation

## 2018-11-13 DIAGNOSIS — K8010 Calculus of gallbladder with chronic cholecystitis without obstruction: Secondary | ICD-10-CM | POA: Diagnosis not present

## 2018-11-13 DIAGNOSIS — R002 Palpitations: Secondary | ICD-10-CM | POA: Diagnosis not present

## 2018-11-13 DIAGNOSIS — K219 Gastro-esophageal reflux disease without esophagitis: Secondary | ICD-10-CM | POA: Diagnosis not present

## 2018-11-13 DIAGNOSIS — I509 Heart failure, unspecified: Secondary | ICD-10-CM | POA: Diagnosis not present

## 2018-11-13 DIAGNOSIS — M109 Gout, unspecified: Secondary | ICD-10-CM | POA: Diagnosis not present

## 2018-11-13 DIAGNOSIS — R51 Headache: Secondary | ICD-10-CM | POA: Insufficient documentation

## 2018-11-13 DIAGNOSIS — M4802 Spinal stenosis, cervical region: Secondary | ICD-10-CM | POA: Insufficient documentation

## 2018-11-13 DIAGNOSIS — K811 Chronic cholecystitis: Secondary | ICD-10-CM

## 2018-11-13 DIAGNOSIS — M5030 Other cervical disc degeneration, unspecified cervical region: Secondary | ICD-10-CM | POA: Diagnosis not present

## 2018-11-13 DIAGNOSIS — K828 Other specified diseases of gallbladder: Secondary | ICD-10-CM | POA: Diagnosis not present

## 2018-11-13 DIAGNOSIS — I2510 Atherosclerotic heart disease of native coronary artery without angina pectoris: Secondary | ICD-10-CM | POA: Insufficient documentation

## 2018-11-13 DIAGNOSIS — Z951 Presence of aortocoronary bypass graft: Secondary | ICD-10-CM | POA: Diagnosis not present

## 2018-11-13 DIAGNOSIS — E785 Hyperlipidemia, unspecified: Secondary | ICD-10-CM | POA: Diagnosis not present

## 2018-11-13 DIAGNOSIS — E669 Obesity, unspecified: Secondary | ICD-10-CM | POA: Insufficient documentation

## 2018-11-13 DIAGNOSIS — E559 Vitamin D deficiency, unspecified: Secondary | ICD-10-CM | POA: Insufficient documentation

## 2018-11-13 DIAGNOSIS — I081 Rheumatic disorders of both mitral and tricuspid valves: Secondary | ICD-10-CM | POA: Insufficient documentation

## 2018-11-13 DIAGNOSIS — I110 Hypertensive heart disease with heart failure: Secondary | ICD-10-CM | POA: Insufficient documentation

## 2018-11-13 HISTORY — PX: CHOLECYSTECTOMY: SHX55

## 2018-11-13 SURGERY — LAPAROSCOPIC CHOLECYSTECTOMY
Anesthesia: General

## 2018-11-13 MED ORDER — ONDANSETRON HCL 4 MG/2ML IJ SOLN
INTRAMUSCULAR | Status: AC
  Filled 2018-11-13: qty 2

## 2018-11-13 MED ORDER — PROPOFOL 10 MG/ML IV BOLUS
INTRAVENOUS | Status: DC | PRN
  Administered 2018-11-13: 150 mg via INTRAVENOUS

## 2018-11-13 MED ORDER — LACTATED RINGERS IV SOLN
INTRAVENOUS | Status: DC
  Administered 2018-11-13: 08:00:00 via INTRAVENOUS

## 2018-11-13 MED ORDER — LIDOCAINE HCL (CARDIAC) PF 100 MG/5ML IV SOSY
PREFILLED_SYRINGE | INTRAVENOUS | Status: DC | PRN
  Administered 2018-11-13: 100 mg via INTRAVENOUS

## 2018-11-13 MED ORDER — BUPIVACAINE HCL (PF) 0.5 % IJ SOLN
INTRAMUSCULAR | Status: AC
  Filled 2018-11-13: qty 30

## 2018-11-13 MED ORDER — OXYCODONE HCL 5 MG PO TABS
5.0000 mg | ORAL_TABLET | Freq: Once | ORAL | Status: AC | PRN
  Administered 2018-11-13: 5 mg via ORAL

## 2018-11-13 MED ORDER — MIDAZOLAM HCL 2 MG/2ML IJ SOLN
INTRAMUSCULAR | Status: DC | PRN
  Administered 2018-11-13: 2 mg via INTRAVENOUS

## 2018-11-13 MED ORDER — FENTANYL CITRATE (PF) 100 MCG/2ML IJ SOLN
INTRAMUSCULAR | Status: AC
  Filled 2018-11-13: qty 2

## 2018-11-13 MED ORDER — LIDOCAINE HCL (PF) 1 % IJ SOLN
INTRAMUSCULAR | Status: AC
  Filled 2018-11-13: qty 30

## 2018-11-13 MED ORDER — SODIUM CHLORIDE 0.9 % IV SOLN
INTRAVENOUS | Status: DC | PRN
  Administered 2018-11-13: 30 ug via INTRAVENOUS

## 2018-11-13 MED ORDER — PROPOFOL 10 MG/ML IV BOLUS
INTRAVENOUS | Status: AC
  Filled 2018-11-13: qty 20

## 2018-11-13 MED ORDER — OXYCODONE-ACETAMINOPHEN 5-325 MG PO TABS: 1.0000 | ORAL_TABLET | ORAL | 0 refills | Status: DC | PRN

## 2018-11-13 MED ORDER — FENTANYL CITRATE (PF) 100 MCG/2ML IJ SOLN: 25.0000 ug | INTRAMUSCULAR | Status: DC | PRN

## 2018-11-13 MED ORDER — LIDOCAINE HCL (PF) 2 % IJ SOLN
INTRAMUSCULAR | Status: AC
  Filled 2018-11-13: qty 10

## 2018-11-13 MED ORDER — ROCURONIUM BROMIDE 50 MG/5ML IV SOLN
INTRAVENOUS | Status: AC
  Filled 2018-11-13: qty 2

## 2018-11-13 MED ORDER — SUGAMMADEX SODIUM 200 MG/2ML IV SOLN
INTRAVENOUS | Status: DC | PRN
  Administered 2018-11-13: 400 mg via INTRAVENOUS

## 2018-11-13 MED ORDER — DEXAMETHASONE SODIUM PHOSPHATE 10 MG/ML IJ SOLN
INTRAMUSCULAR | Status: AC
  Filled 2018-11-13: qty 1

## 2018-11-13 MED ORDER — SUGAMMADEX SODIUM 200 MG/2ML IV SOLN
INTRAVENOUS | Status: AC
  Filled 2018-11-13: qty 4

## 2018-11-13 MED ORDER — OXYCODONE HCL 5 MG/5ML PO SOLN: 5.0000 mg | Freq: Once | ORAL | Status: AC | PRN

## 2018-11-13 MED ORDER — MEPERIDINE HCL 50 MG/ML IJ SOLN: 6.2500 mg | INTRAMUSCULAR | Status: DC | PRN

## 2018-11-13 MED ORDER — ROCURONIUM BROMIDE 100 MG/10ML IV SOLN
INTRAVENOUS | Status: DC | PRN
  Administered 2018-11-13: 50 mg via INTRAVENOUS

## 2018-11-13 MED ORDER — OXYCODONE HCL 5 MG PO TABS
ORAL_TABLET | ORAL | Status: AC
  Filled 2018-11-13: qty 1

## 2018-11-13 MED ORDER — ACETAMINOPHEN 500 MG PO TABS
1000.0000 mg | ORAL_TABLET | ORAL | Status: AC
  Administered 2018-11-13: 1000 mg via ORAL

## 2018-11-13 MED ORDER — CEFAZOLIN SODIUM-DEXTROSE 2-4 GM/100ML-% IV SOLN
INTRAVENOUS | Status: AC
  Filled 2018-11-13: qty 100

## 2018-11-13 MED ORDER — MIDAZOLAM HCL 2 MG/2ML IJ SOLN
INTRAMUSCULAR | Status: AC
  Filled 2018-11-13: qty 2

## 2018-11-13 MED ORDER — CHLORHEXIDINE GLUCONATE CLOTH 2 % EX PADS: 6.0000 | MEDICATED_PAD | Freq: Once | CUTANEOUS | Status: DC

## 2018-11-13 MED ORDER — ONDANSETRON HCL 4 MG/2ML IJ SOLN
INTRAMUSCULAR | Status: DC | PRN
  Administered 2018-11-13: 4 mg via INTRAVENOUS

## 2018-11-13 MED ORDER — PHENYLEPHRINE HCL 10 MG/ML IJ SOLN
INTRAMUSCULAR | Status: DC | PRN
  Administered 2018-11-13: 100 ug via INTRAVENOUS
  Administered 2018-11-13: 200 ug via INTRAVENOUS
  Administered 2018-11-13: 150 ug via INTRAVENOUS

## 2018-11-13 MED ORDER — FENTANYL CITRATE (PF) 100 MCG/2ML IJ SOLN
INTRAMUSCULAR | Status: DC | PRN
  Administered 2018-11-13 (×4): 50 ug via INTRAVENOUS

## 2018-11-13 MED ORDER — LIDOCAINE HCL 1 % IJ SOLN
INTRAMUSCULAR | Status: DC | PRN
  Administered 2018-11-13: 20 mL via INTRAMUSCULAR

## 2018-11-13 MED ORDER — DEXAMETHASONE SODIUM PHOSPHATE 10 MG/ML IJ SOLN
INTRAMUSCULAR | Status: DC | PRN
  Administered 2018-11-13: 10 mg via INTRAVENOUS

## 2018-11-13 MED ORDER — ACETAMINOPHEN 500 MG PO TABS
ORAL_TABLET | ORAL | Status: AC
  Administered 2018-11-13: 1000 mg via ORAL
  Filled 2018-11-13: qty 2

## 2018-11-13 MED ORDER — PROMETHAZINE HCL 25 MG/ML IJ SOLN: 6.2500 mg | INTRAMUSCULAR | Status: DC | PRN

## 2018-11-13 SURGICAL SUPPLY — 39 items
ADH SKN CLS APL DERMABOND .7 (GAUZE/BANDAGES/DRESSINGS) ×1
APPLIER CLIP ROT 10 11.4 M/L (STAPLE) ×2
APR CLP MED LRG 11.4X10 (STAPLE) ×1
BAG SPEC RTRVL LRG 6X4 10 (ENDOMECHANICALS) ×1
CHLORAPREP W/TINT 26ML (MISCELLANEOUS) ×2 IMPLANT
CLIP APPLIE ROT 10 11.4 M/L (STAPLE) ×1 IMPLANT
COVER WAND RF STERILE (DRAPES) ×1 IMPLANT
DECANTER SPIKE VIAL GLASS SM (MISCELLANEOUS) ×2 IMPLANT
DERMABOND ADVANCED (GAUZE/BANDAGES/DRESSINGS) ×1
DERMABOND ADVANCED .7 DNX12 (GAUZE/BANDAGES/DRESSINGS) ×1 IMPLANT
DRESSING SURGICEL FIBRLLR 1X2 (HEMOSTASIS) IMPLANT
DRSG SURGICEL FIBRILLAR 1X2 (HEMOSTASIS)
ELECT REM PT RETURN 9FT ADLT (ELECTROSURGICAL) ×2
ELECTRODE REM PT RTRN 9FT ADLT (ELECTROSURGICAL) ×1 IMPLANT
GLOVE BIO SURGEON STRL SZ7 (GLOVE) ×2 IMPLANT
GLOVE BIOGEL PI IND STRL 7.5 (GLOVE) ×1 IMPLANT
GLOVE BIOGEL PI INDICATOR 7.5 (GLOVE) ×1
GOWN STRL REUS W/ TWL LRG LVL3 (GOWN DISPOSABLE) ×3 IMPLANT
GOWN STRL REUS W/TWL LRG LVL3 (GOWN DISPOSABLE) ×6
GRASPER SUT TROCAR 14GX15 (MISCELLANEOUS) ×2 IMPLANT
IRRIGATION STRYKERFLOW (MISCELLANEOUS) IMPLANT
IRRIGATOR STRYKERFLOW (MISCELLANEOUS)
IV NS 1000ML (IV SOLUTION) ×2
IV NS 1000ML BAXH (IV SOLUTION) ×1 IMPLANT
KIT TURNOVER KIT A (KITS) ×2 IMPLANT
LABEL OR SOLS (LABEL) ×2 IMPLANT
NDL INSUFFLATION 14GA 120MM (NEEDLE) ×1 IMPLANT
NEEDLE HYPO 22GX1.5 SAFETY (NEEDLE) ×2 IMPLANT
NEEDLE INSUFFLATION 14GA 120MM (NEEDLE) ×2 IMPLANT
NS IRRIG 1000ML POUR BTL (IV SOLUTION) ×2 IMPLANT
PACK LAP CHOLECYSTECTOMY (MISCELLANEOUS) ×2 IMPLANT
POUCH SPECIMEN RETRIEVAL 10MM (ENDOMECHANICALS) ×2 IMPLANT
SCISSORS METZENBAUM CVD 33 (INSTRUMENTS) ×1 IMPLANT
SLEEVE ENDOPATH XCEL 5M (ENDOMECHANICALS) ×4 IMPLANT
SUT MNCRL AB 4-0 PS2 18 (SUTURE) ×3 IMPLANT
SUT VICRYL 0 UR6 27IN ABS (SUTURE) ×3 IMPLANT
SUT VICRYL AB 3-0 FS1 BRD 27IN (SUTURE) ×2 IMPLANT
TROCAR XCEL 12X100 BLDLESS (ENDOMECHANICALS) ×1 IMPLANT
TROCAR XCEL NON-BLD 5MMX100MML (ENDOMECHANICALS) ×2 IMPLANT

## 2018-11-13 NOTE — Anesthesia Preprocedure Evaluation (Signed)
Anesthesia Evaluation  Patient identified by MRN, date of birth, ID band Patient awake    Reviewed: Allergy & Precautions, NPO status , Patient's Chart, lab work & pertinent test results  History of Anesthesia Complications Negative for: history of anesthetic complications  Airway Mallampati: II  TM Distance: >3 FB Neck ROM: Full    Dental no notable dental hx.    Pulmonary neg sleep apnea, neg COPD, former smoker,    breath sounds clear to auscultation- rhonchi (-) wheezing      Cardiovascular hypertension, (-) angina+ CAD, + CABG and +CHF  (-) Cardiac Stents  Rhythm:Regular Rate:Normal - Systolic murmurs and - Diastolic murmurs Echo 1/93/79: NORMAL LEFT VENTRICULAR SYSTOLIC FUNCTION WITH AN ESTIMATED EF = 55 % NORMAL RIGHT VENTRICULAR SYSTOLIC FUNCTION MILD TRICUSPID AND MITRAL VALVE INSUFFICIENCY NO VALVULAR STENOSIS MILD BIATRIAL ENLARGEMENT MILD RV ENLARGEMENT MODERATE LVH   Neuro/Psych  Headaches, neg Seizures negative psych ROS   GI/Hepatic Neg liver ROS, GERD  ,  Endo/Other  negative endocrine ROSneg diabetes  Renal/GU negative Renal ROS     Musculoskeletal  (+) Arthritis ,   Abdominal (+) + obese,   Peds  Hematology  (+) anemia ,   Anesthesia Other Findings Past Medical History: No date: Allergic rhinitis No date: CHF (congestive heart failure) (HCC) No date: Coronary artery disease No date: DDD (degenerative disc disease), cervical No date: Dyspnea No date: Early cataract No date: Erectile dysfunction No date: GERD (gastroesophageal reflux disease) No date: Gouty arthritis No date: Headache     Comment:  occasional migraines No date: Hypercalcemia No date: Hyperlipemia No date: Hypertension No date: Palpitations No date: Peripheral vascular disease ([place]) 09/30/2017: S/P CABG x 5     Comment:  LIMA to LAD SVG to DISTAL CIRCUMFLEX SVG SEQUENTIALLY to              OM1 and OM2 SVG to ACUTE  MARGINAL No date: Spinal stenosis of cervical region No date: Vitamin D deficiency   Reproductive/Obstetrics                             Anesthesia Physical Anesthesia Plan  ASA: III  Anesthesia Plan: General   Post-op Pain Management:    Induction: Intravenous  PONV Risk Score and Plan: 1 and Ondansetron and Dexamethasone  Airway Management Planned: Oral ETT  Additional Equipment:   Intra-op Plan:   Post-operative Plan: Extubation in OR  Informed Consent: I have reviewed the patients History and Physical, chart, labs and discussed the procedure including the risks, benefits and alternatives for the proposed anesthesia with the patient or authorized representative who has indicated his/her understanding and acceptance.     Dental advisory given  Plan Discussed with: CRNA and Anesthesiologist  Anesthesia Plan Comments:         Anesthesia Quick Evaluation

## 2018-11-13 NOTE — Anesthesia Postprocedure Evaluation (Signed)
Anesthesia Post Note  Patient: Craig [name]  Procedure(s) Performed: LAPAROSCOPIC CHOLECYSTECTOMY (N/A )  Patient location during evaluation: PACU Anesthesia Type: General Level of consciousness: awake and alert and oriented Pain management: pain level controlled Vital Signs Assessment: post-procedure vital signs reviewed and stable Respiratory status: spontaneous breathing, nonlabored ventilation and respiratory function stable Cardiovascular status: blood pressure returned to baseline and stable Postop Assessment: no signs of nausea or vomiting Anesthetic complications: no     Last Vitals:  Vitals:   11/13/18 1235 11/13/18 1303  BP: (!) 173/72 (!) 151/79  Pulse: 75 73  Resp: 14 12  Temp: 36.7 C   SpO2: 97% 98%    Last Pain:  Vitals:   11/13/18 1303  TempSrc:   PainSc: 7                  [name]

## 2018-11-13 NOTE — Anesthesia Post-op Follow-up Note (Signed)
Anesthesia QCDR form completed.        

## 2018-11-13 NOTE — Discharge Instructions (Addendum)
In addition to included general post-operative instructions for Laparoscopic Cholecystectomy,  Diet: Resume home heart healthy diet (may prefer to start with low fat foods and advance as tolerated, as discussed).   Activity: No heavy lifting >15 - 20 pounds (children, pets, laundry, garbage) or strenuous activity until follow-up in 2 weeks, but light activity and walking are encouraged. Do not drive or drink alcohol if taking narcotic pain medications.  Wound care: 2 days after surgery (Wednesday, 2/5), you may shower/get incision wet with soapy water and pat dry (do not rub incisions), but no baths or submerging incision underwater until follow-up.   Medications: Resume all home medications. For mild to moderate pain: acetaminophen (Tylenol) or ibuprofen/naproxen (if no kidney disease). Combining Tylenol with alcohol can substantially increase your risk of causing liver disease. Narcotic pain medications, if prescribed, can be used for severe pain, though may cause nausea, constipation, and drowsiness. Do not combine Tylenol and Percocet (or similar) within a 6 hour period as Percocet (and similar) contain(s) Tylenol. If you do not need the narcotic pain medication, you do not need to fill the prescription.  Call office [phone]) at any time if any questions, worsening pain, fevers/chills, bleeding, drainage from incision site, or other concerns.  AMBULATORY SURGERY  DISCHARGE INSTRUCTIONS   1) The drugs that you were given will stay in your system until tomorrow so for the next 24 hours you should not:  A) Drive an automobile B) Make any legal decisions C) Drink any alcoholic beverage   2) You may resume regular meals tomorrow.  Today it is better to start with liquids and gradually work up to solid foods.  You may eat anything you prefer, but it is better to start with liquids, then soup and crackers, and gradually work up to solid foods.   3) Please notify your doctor immediately  if you have any unusual bleeding, trouble breathing, redness and pain at the surgery site, drainage, fever, or pain not relieved by medication.    4) Additional Instructions: TAKE A STOOL SOFTENER TWICE A DAY WHILE TAKING NARCOTIC PAIN MEDICINE TO PREVENT CONSTIPATION, splint abdomen with pillow when walking, coughing or straining   Please contact your physician with any problems or Same Day Surgery at [phone], Monday through Friday 6 am to 4 pm, or [place] at [facility] number at [phone].

## 2018-11-13 NOTE — Progress Notes (Signed)
Ch spoke briefly w/ pt and wife before pt was called back for pre-op. Pt shared that he has delayed this procedure for over a year due to other health challenges. Ch provided words of comfort and encouraged both pt and his wife. Procedure does not require him to be admitted- no f/u at this time.     11/13/18 0700  Clinical Encounter Type  Visited With Patient and family together  Visit Type Social support;Pre-op  Spiritual Encounters  Spiritual Needs Emotional  Stress Factors  Patient Stress Factors Health changes

## 2018-11-13 NOTE — Interval H&P Note (Signed)
History and Physical Interval Note:  11/13/2018 9:33 AM  Craig [name]  has presented today for surgery, with the diagnosis of SX CHOLELITHIASIS  The various methods of treatment have been discussed with the patient and family. After consideration of risks, benefits and other options for treatment, the patient has consented to  Procedure(s): LAPAROSCOPIC CHOLECYSTECTOMY (N/A) as a surgical intervention .  The patient's history has been reviewed, patient examined, no change in status, stable for surgery.  I have reviewed the patient's chart and labs.  Questions were answered to the patient's satisfaction.     Vickie Epley

## 2018-11-13 NOTE — Anesthesia Procedure Notes (Signed)
Procedure Name: Intubation Date/Time: 11/13/2018 10:03 AM Performed by: Lavone Orn, CRNA Pre-anesthesia Checklist: Patient identified, Emergency Drugs available, Suction available, Patient being monitored and Timeout performed Patient Re-evaluated:Patient Re-evaluated prior to induction Oxygen Delivery Method: Circle system utilized Preoxygenation: Pre-oxygenation with 100% oxygen Induction Type: IV induction Ventilation: Mask ventilation without difficulty and Oral airway inserted - appropriate to patient size Laryngoscope Size: McGraph and 4 Grade View: Grade II Tube type: Oral Tube size: 7.5 mm Number of attempts: 2 Airway Equipment and Method: Stylet and Video-laryngoscopy Placement Confirmation: ETT inserted through vocal cords under direct vision and breath sounds checked- equal and bilateral Secured at: 22 cm Tube secured with: Tape Dental Injury: Teeth and Oropharynx as per pre-operative assessment  Difficulty Due To: Difficult Airway- due to immobile epiglottis and Difficult Airway- due to large tongue

## 2018-11-13 NOTE — Transfer of Care (Signed)
Immediate Anesthesia Transfer of Care Note  Patient: Craig [name]  Procedure(s) Performed: LAPAROSCOPIC CHOLECYSTECTOMY (N/A )  Patient Location: PACU  Anesthesia Type:General  Level of Consciousness: awake and patient cooperative  Airway & Oxygen Therapy: Patient Spontanous Breathing and Patient connected to face mask oxygen  Post-op Assessment: Report given to RN and Post -op Vital signs reviewed and stable  Post vital signs: stable  Last Vitals:  Vitals Value Taken Time  BP 168/79 11/13/2018 11:40 AM  Temp 36.4 C 11/13/2018 11:40 AM  Pulse 74 11/13/2018 11:47 AM  Resp 9 11/13/2018 11:47 AM  SpO2 100 % 11/13/2018 11:47 AM  Vitals shown include unvalidated device data.  Last Pain:  Vitals:   11/13/18 1140  TempSrc:   PainSc: 8          Complications: No apparent anesthesia complications

## 2018-11-13 NOTE — Op Note (Signed)
SURGICAL OPERATIVE REPORT   DATE OF PROCEDURE: 11/13/2018  ATTENDING Surgeon(s): Vickie Epley, MD  ASSISTANT(S): Floyce Stakes, RNFA   ANESTHESIA: GETA  PRE-OPERATIVE DIAGNOSIS: Symptomatic Cholelithiasis (K80.20)  POST-OPERATIVE DIAGNOSIS: Chronic cholecystitis (K81.1)  PROCEDURE(S): (cpt's: 62130) 1.) Laparoscopic Cholecystectomy  INTRAOPERATIVE FINDINGS: Moderate fatty pericholecystic inflammation with cystic duct and cystic artery clips well-secured, hemostasis at completion of procedure  INTRAOPERATIVE FLUIDS: 700 mL crystalloid   ESTIMATED BLOOD LOSS: Minimal (<30 mL)   URINE OUTPUT: No foley  SPECIMENS: Gallbladder  IMPLANTS: None  DRAINS: None   COMPLICATIONS: None apparent   CONDITION AT COMPLETION: Hemodynamically stable and extubated  DISPOSITION: PACU   INDICATION(S) FOR PROCEDURE:  Patient is a 73 y.o. male who recently presented to outpatient surgery office with RUQ > epigastric abdominal pain. Ultrasound suggested cholelithiasis without sonographic evidence of cholecystitis. All risks, benefits, and alternatives to above elective procedures were discussed with the patient, who elected to proceed, and informed consent was accordingly obtained at that time.   DETAILS OF PROCEDURE:  Patient was brought to the operating suite and appropriately identified. General anesthesia was administered along with peri-operative prophylactic IV antibiotics, and endotracheal intubation was performed by anesthesiologist, along with NG/OG tube for gastric decompression. In supine position, operative site was prepped and draped in usual sterile fashion, and following a brief time out, initial 5 mm incision was made in a natural skin crease just above the umbilicus. Fascia was then elevated, and a Verress needle was inserted and its proper position confirmed using aspiration and saline meniscus test.  Upon insufflation of the abdominal cavity with carbon dioxide to a  well-tolerated pressure of 12-15 mmHg, 5 mm peri-umbilical port followed by laparoscope were inserted and used to inspect the abdominal cavity and its contents with no injuries from insertion of the first trochar noted. Three additional trocars were inserted, one at the epigastric position (10 mm) and two along the Right costal margin (5 mm). The table was then placed in reverse Trendelenburg position with the Right side up. Filmy adhesions between the gallbladder and omentum/duodenum/transverse colon were lysed using combined blunt dissection and selective electrocautery. The apex/dome of the gallbladder was grasped with an atraumatic grasper passed through the lateral port and retracted apically over the liver. The infundibulum was also grasped and retracted, exposing Calot's triangle. The peritoneum overlying the gallbladder infundibulum was incised and dissected free of surrounding peritoneal attachments, revealing the cystic duct and cystic artery, which were clipped twice on the patient side and once on the gallbladder specimen side close to the gallbladder. The gallbladder was then dissected from its peritoneal attachments to the liver using electrocautery, and the gallbladder was placed into a laparoscopic specimen bag and removed from the abdominal cavity via the epigastric port site. Hemostasis and secure placement of clips were confirmed, and intra-peritoneal cavity was inspected with no additional findings. PMI laparoscopic fascial closure device was then used to re-approximate fascia at the 10 mm epigastric port site.  All ports were then removed under direct visualization, and abdominal cavity was desuflated. All port sites were irrigated/cleaned, additional local anesthetic was injected at each incision, 3-0 Vicryl was used to re-approximate dermis at 10 mm port site(s), and subcuticular 4-0 Monocryl suture was used to re-approximate skin. Skin was then cleaned, dried, and sterile skin glue was  applied. Patient was then safely able to be awakened, extubated, and transferred to PACU for post-operative monitoring and care.   I was present for all aspects of the above procedure, and no  operative complications were apparent.

## 2018-11-14 ENCOUNTER — Encounter: Payer: Self-pay | Admitting: Surgery

## 2018-11-14 LAB — SURGICAL PATHOLOGY

## 2018-11-15 DIAGNOSIS — K811 Chronic cholecystitis: Secondary | ICD-10-CM

## 2018-11-28 ENCOUNTER — Other Ambulatory Visit: Payer: Self-pay | Admitting: Family Medicine

## 2018-11-28 ENCOUNTER — Encounter: Payer: Self-pay | Admitting: Surgery

## 2018-11-28 ENCOUNTER — Other Ambulatory Visit: Payer: Self-pay

## 2018-11-28 ENCOUNTER — Ambulatory Visit (INDEPENDENT_AMBULATORY_CARE_PROVIDER_SITE_OTHER): Payer: Medicare Other | Admitting: Surgery

## 2018-11-28 ENCOUNTER — Telehealth: Payer: Self-pay | Admitting: Family Medicine

## 2018-11-28 VITALS — BP 177/80 | HR 71 | Temp 97.9°F | Ht 69.0 in | Wt 206.6 lb

## 2018-11-28 DIAGNOSIS — K811 Chronic cholecystitis: Secondary | ICD-10-CM

## 2018-11-28 MED ORDER — VALSARTAN 160 MG PO TABS: 160.0000 mg | ORAL_TABLET | Freq: Two times a day (BID) | ORAL | 3 refills | Status: DC

## 2018-11-28 NOTE — Telephone Encounter (Signed)
Patient went to pick up Valsartan at [facility] in [facility] but new RX had not been called in.  He takes this twice a day now.    Please call in new RX ASAP as he is out.

## 2018-11-28 NOTE — Telephone Encounter (Signed)
done

## 2018-11-28 NOTE — Patient Instructions (Addendum)
Patient will need to return to the office as needed. Lift no more than 40lbs for the next 2 weeks.  Call the office with any questions or concerns.

## 2018-11-28 NOTE — Progress Notes (Signed)
Surgical Clinic Progress/Follow-up Note   73 y.o. Male presents to clinic for post-op follow-up 15 Days s/p laparoscopic cholecystectomy Rosana Hoes, 11/13/2018) for chronic cholecystitis. Patient reports complete resolution of pre-operative pain and has been tolerating regular diet with +flatus and normal BM's, denies N/V, fever/chills, CP, or SOB.  Review of Systems:  Constitutional: denies fever/chills  Respiratory: denies shortness of breath, wheezing  Cardiovascular: denies chest pain, palpitations  Gastrointestinal: abdominal pain, N/V, and bowel function as per interval history Skin: Denies any other rashes or skin discolorations except post-surgical wounds as per interval history  Vital Signs:  BP (!) 177/80   Pulse 71   Temp 97.9 F (36.6 C) (Temporal)   Ht 5\' 9"  (1.753 m)   Wt 206 lb 9.6 oz (93.7 kg)   SpO2 97%   BMI 30.51 kg/m    Physical Exam:  Constitutional:  -- Overweight non-obese body habitus  -- Awake, alert, and oriented x3  Pulmonary:  -- No crackles -- Equal breath sounds bilaterally -- Breathing non-labored at rest Cardiovascular:  -- S1, S2 present  -- No pericardial rubs  Gastrointestinal:  -- Soft and non-distended, with minimal focal Right-of-epigastric peri-incisional tenderness to palpation, no guarding/rebound tenderness -- Post-surgical incisions all very well-approximated without any peri-incisional erythema or drainage -- No abdominal masses appreciated, pulsatile or otherwise  Musculoskeletal / Integumentary:  -- Wounds or skin discoloration: None appreciated except post-surgical incisions as described above (GI) -- Extremities: B/L UE and LE FROM, hands and feet warm, no edema   Imaging: No new pertinent imaging available for review  Assessment:  73 y.o. yo Male with a problem list including...  Patient Active Problem List   Diagnosis Date Noted  . Chronic cholecystitis   . Symptomatic cholelithiasis 10/31/2018  . Calculus of  gallbladder without cholecystitis without obstruction 08/03/2018  . LVH (left ventricular hypertrophy) due to hypertensive disease, without heart failure 01/04/2018  . 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 Postoperative anemia   . Coronary artery disease involving native coronary artery of native heart   . Spondylolisthesis, lumbar region 03/16/2017  . Hyperlipemia, mixed 09/10/2015  . 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 Essential (primary) hypertension 09/10/2015  . Degeneration of cervical intervertebral disc 09/10/2015  . Allergic rhinitis 09/10/2015  . GERD (gastroesophageal reflux disease) 06/25/2015  . Cervical stenosis of spinal canal 06/25/2015  . Peripheral vascular disease ([place]) 06/25/2015    presents to clinic for post-op follow-up evaluation, doing well 15 Days s/p laparoscopic cholecystectomy Rosana Hoes, 11/13/2018) for chronic cholecystitis.  Plan:              - advance diet as tolerated              - okay to submerge incisions under water (baths, swimming) prn             - gradually resume all activities without restrictions over next 2 weeks             - apply sunblock particularly to incisions with sun exposure to reduce pigmentation of scars             - return to clinic as needed, instructed to call office if any questions or concerns  All of the above recommendations were discussed with the patient, and all of patient's questions were answered to his expressed satisfaction.  -- Marilynne Drivers Rosana Hoes, MD, [place]: [facility] - Partnering for exceptional care. Office: [phone]

## 2018-12-13 ENCOUNTER — Encounter: Payer: Self-pay | Admitting: Physician Assistant

## 2018-12-13 ENCOUNTER — Ambulatory Visit (INDEPENDENT_AMBULATORY_CARE_PROVIDER_SITE_OTHER): Payer: Medicare Other | Admitting: Physician Assistant

## 2018-12-13 VITALS — BP 136/74 | HR 83 | Temp 98.7°F | Resp 16 | Wt 198.8 lb

## 2018-12-13 DIAGNOSIS — J440 Chronic obstructive pulmonary disease with acute lower respiratory infection: Secondary | ICD-10-CM | POA: Diagnosis not present

## 2018-12-13 DIAGNOSIS — J209 Acute bronchitis, unspecified: Secondary | ICD-10-CM | POA: Diagnosis not present

## 2018-12-13 MED ORDER — BENZONATATE 200 MG PO CAPS: 200.0000 mg | ORAL_CAPSULE | Freq: Two times a day (BID) | ORAL | 0 refills | Status: DC | PRN

## 2018-12-13 MED ORDER — ALBUTEROL SULFATE HFA 108 (90 BASE) MCG/ACT IN AERS: 2.0000 | INHALATION_SPRAY | Freq: Four times a day (QID) | RESPIRATORY_TRACT | 0 refills | Status: DC | PRN

## 2018-12-13 MED ORDER — AZITHROMYCIN 250 MG PO TABS: ORAL_TABLET | ORAL | 0 refills | Status: DC

## 2018-12-13 MED ORDER — PREDNISONE 10 MG (21) PO TBPK: ORAL_TABLET | ORAL | 0 refills | Status: DC

## 2018-12-13 NOTE — Progress Notes (Signed)
Patient: Craig [name] Male    DOB: [birth year]   73 y.o.   MRN: [id number] Visit Date: 12/13/2018  Today's Provider: Mar Daring, PA-C   Chief Complaint  Patient presents with  . URI   Subjective:    I, Sulibeya S. Dimas, CMA, am acting as a Education administrator for [employer], PA-C.  URI   This is a new problem. The current episode started in the past 7 days. The problem has been gradually worsening. There has been no fever. Associated symptoms include abdominal pain, congestion, coughing, headaches, rhinorrhea, sinus pain and wheezing. Pertinent negatives include no ear pain, sore throat, swollen glands or vomiting. He has tried nothing for the symptoms.    Allergies  Allergen Reactions  . Lipitor [Atorvastatin] Other (See Comments)    MYALGIA  . Zetia [Ezetimibe] Other (See Comments)    MYALGIA  . Lisinopril Cough  . Protonix [Pantoprazole] Other (See Comments)    headache  . Spironolactone Other (See Comments)    Breast tenderness     Current Outpatient Medications:  .  acetaminophen (TYLENOL) 500 MG tablet, Take 2 tablets (1,000 mg total) by mouth every 6 (six) hours. (Patient taking differently: Take 1,000 mg by mouth every 6 (six) hours as needed for moderate pain. ), Disp: 30 tablet, Rfl: 0 .  aspirin EC 81 MG tablet, Take 81 mg by mouth daily., Disp: , Rfl:  .  carvedilol (COREG) 6.25 MG tablet, Take 6.25 mg by mouth 2 (two) times daily with a meal., Disp: , Rfl: 3 .  diltiazem (CARDIZEM CD) 300 MG 24 hr capsule, TAKE 1 CAPSULE (300 MG TOTAL) BY MOUTH DAILY., Disp: 90 capsule, Rfl: 3 .  dorzolamide-timolol (COSOPT) 22.3-6.8 MG/ML ophthalmic solution, INSTILL ONE DROP INTO BOTH EYES TWICE DAILY, Disp: , Rfl:  .  gabapentin (NEURONTIN) 300 MG capsule, TAKE 2 CAPSULES BY MOUTH 3 TIMES A DAY (Patient taking differently: Take 600 mg by mouth 3 (three) times daily. TAKE 2 CAPSULES BY MOUTH 3 TIMES A DAY), Disp: 180 capsule, Rfl: 5 .  hydrochlorothiazide  (HYDRODIURIL) 25 MG tablet, Take 1 tablet (25 mg total) by mouth daily., Disp: 90 tablet, Rfl: 3 .  latanoprost (XALATAN) 0.005 % ophthalmic solution, Place 1 drop into both eyes at bedtime. , Disp: , Rfl: 3 .  Multiple Vitamin (MULTIVITAMIN WITH MINERALS) TABS tablet, Take 1 tablet by mouth daily., Disp: , Rfl:  .  OMEGA-3 FATTY ACIDS PO, Take 1,200 mg by mouth daily. , Disp: , Rfl:  .  omeprazole (PRILOSEC) 40 MG capsule, TAKE 1 CAPSULE BY MOUTH EVERY DAY (Patient taking differently: Take 40 mg by mouth daily. ), Disp: 90 capsule, Rfl: 1 .  pravastatin (PRAVACHOL) 80 MG tablet, Take 1 tablet (80 mg total) by mouth daily., Disp: 90 tablet, Rfl: 3 .  sucralfate (CARAFATE) 1 g tablet, Take 1 tablet (1 g total) by mouth 4 (four) times daily -  with meals and at bedtime. (Patient taking differently: Take 1 g by mouth 2 (two) times daily with a meal. ), Disp: 28 tablet, Rfl: 0 .  triamcinolone cream (KENALOG) 0.1 %, APPLY 1 APPLICATION TOPICALLY 2 (TWO) TIMES DAILY. TO EXTERNAL EAR, Disp: 30 g, Rfl: 0 .  valsartan (DIOVAN) 160 MG tablet, Take 1 tablet (160 mg total) by mouth 2 (two) times daily., Disp: 180 tablet, Rfl: 3  Review of Systems  Constitutional: Positive for chills and fever. Negative for fatigue.  HENT: Positive for congestion, rhinorrhea and sinus  pain. Negative for ear pain and sore throat.   Respiratory: Positive for cough, chest tightness and wheezing.   Cardiovascular: Negative.   Gastrointestinal: Positive for abdominal pain. Negative for vomiting.  Neurological: Positive for headaches. Negative for dizziness.    Social History   Tobacco Use  . Smoking status: Former Smoker    Packs/day: 1.00    Years: 50.00    Pack years: 50.00    Types: Cigarettes    Last attempt to quit: 09/27/2017    Years since quitting: 1.2  . Smokeless tobacco: Former Systems developer    Types: Chew  Substance Use Topics  . Alcohol use: Not Currently    Comment: stopped drinking before cabg        Objective:   BP 136/74 (BP Location: Left Arm, Patient Position: Sitting, Cuff Size: Normal)   Pulse 83   Temp 98.7 F (37.1 C) (Oral)   Resp 16   Wt 198 lb 12.8 oz (90.2 kg)   SpO2 96%   BMI 29.36 kg/m  Vitals:   12/13/18 1500  BP: 136/74  Pulse: 83  Resp: 16  Temp: 98.7 F (37.1 C)  TempSrc: Oral  SpO2: 96%  Weight: 198 lb 12.8 oz (90.2 kg)     Physical Exam Vitals signs reviewed.  Constitutional:      General: He is not in acute distress.    Appearance: Normal appearance. He is well-developed. He is ill-appearing. He is not diaphoretic.  HENT:     Head: Normocephalic and atraumatic.     Right Ear: Hearing, tympanic membrane, ear canal and external ear normal. No middle ear effusion. Tympanic membrane is not erythematous or bulging.     Left Ear: Hearing, tympanic membrane, ear canal and external ear normal.  No middle ear effusion. Tympanic membrane is not erythematous or bulging.     Nose: Mucosal edema, congestion and rhinorrhea present.     Right Sinus: No maxillary sinus tenderness or frontal sinus tenderness.     Left Sinus: No maxillary sinus tenderness or frontal sinus tenderness.     Mouth/Throat:     Mouth: Mucous membranes are moist.     Pharynx: Uvula midline. Posterior oropharyngeal erythema present. No oropharyngeal exudate.  Eyes:     General:        Right eye: No discharge.        Left eye: No discharge.     Conjunctiva/sclera: Conjunctivae normal.     Pupils: Pupils are equal, round, and reactive to light.  Neck:     Musculoskeletal: Normal range of motion and neck supple.     Thyroid: No thyromegaly.     Trachea: No tracheal deviation.     Meningeal: Brudzinski's sign and Kernig's sign absent.  Cardiovascular:     Rate and Rhythm: Normal rate and regular rhythm.     Heart sounds: Normal heart sounds. No murmur. No friction rub. No gallop.   Pulmonary:     Effort: Pulmonary effort is normal. No respiratory distress.     Breath sounds: No  stridor. Wheezing and rhonchi present. No rales.  Lymphadenopathy:     Cervical: No cervical adenopathy.  Skin:    General: Skin is warm and dry.  Neurological:     Mental Status: He is alert.         Assessment & Plan    1. Acute bronchitis with COPD ([place]) Worsening. Will treat with zpak, prednisone, albuterol inhaler and tessalon perles. Push fluids. Rest. Call if worsening.  -  azithromycin (ZITHROMAX) 250 MG tablet; Take 2 tablets PO on day one, and one tablet PO daily thereafter until completed.  Dispense: 6 tablet; Refill: 0 - predniSONE (STERAPRED UNI-PAK 21 TAB) 10 MG (21) TBPK tablet; 6 day taper; take as directed on package instructions  Dispense: 21 tablet; Refill: 0 - albuterol (PROVENTIL HFA;VENTOLIN HFA) 108 (90 Base) MCG/ACT inhaler; Inhale 2 puffs into the lungs every 6 (six) hours as needed for wheezing or shortness of breath.  Dispense: 1 Inhaler; Refill: 0     Mar Daring, PA-C  [facility]

## 2018-12-13 NOTE — Patient Instructions (Signed)
Acute Bronchitis, Adult Acute bronchitis is when air tubes (bronchi) in the lungs suddenly get swollen. The condition can make it hard to breathe. It can also cause these symptoms:  A cough.  Coughing up clear, yellow, or green mucus.  Wheezing.  Chest congestion.  Shortness of breath.  A fever.  Body aches.  Chills.  A sore throat. Follow these instructions at home:  Medicines  Take over-the-counter and prescription medicines only as told by your doctor.  If you were prescribed an antibiotic medicine, take it as told by your doctor. Do not stop taking the antibiotic even if you start to feel better. General instructions  Rest.  Drink enough fluids to keep your pee (urine) pale yellow.  Avoid smoking and secondhand smoke. If you smoke and you need help quitting, ask your doctor. Quitting will help your lungs heal faster.  Use an inhaler, cool mist vaporizer, or humidifier as told by your doctor.  Keep all follow-up visits as told by your doctor. This is important. How is this prevented? To lower your risk of getting this condition again:  Wash your hands often with soap and water. If you cannot use soap and water, use hand sanitizer.  Avoid contact with people who have cold symptoms.  Try not to touch your hands to your mouth, nose, or eyes.  Make sure to get the flu shot every year. Contact a doctor if:  Your symptoms do not get better in 2 weeks. Get help right away if:  You cough up blood.  You have chest pain.  You have very bad shortness of breath.  You become dehydrated.  You faint (pass out) or keep feeling like you are going to pass out.  You keep throwing up (vomiting).  You have a very bad headache.  Your fever or chills gets worse. This information is not intended to replace advice given to you by your health care provider. Make sure you discuss any questions you have with your health care provider. Document Released: 03/15/2008 Document  Revised: 05/11/2017 Document Reviewed: 03/17/2016 Elsevier Interactive Patient Education  2019 Elsevier Inc.  

## 2018-12-20 ENCOUNTER — Telehealth: Payer: Self-pay | Admitting: *Deleted

## 2018-12-20 DIAGNOSIS — J440 Chronic obstructive pulmonary disease with acute lower respiratory infection: Principal | ICD-10-CM

## 2018-12-20 DIAGNOSIS — J209 Acute bronchitis, unspecified: Secondary | ICD-10-CM

## 2018-12-20 MED ORDER — PREDNISONE 10 MG (21) PO TBPK: ORAL_TABLET | ORAL | 0 refills | Status: DC

## 2018-12-20 MED ORDER — AZITHROMYCIN 250 MG PO TABS: ORAL_TABLET | ORAL | 0 refills | Status: DC

## 2018-12-20 NOTE — Telephone Encounter (Signed)
Sent in second round. If no improvements must be seen

## 2018-12-20 NOTE — Telephone Encounter (Signed)
Patient advised as below.  

## 2018-12-20 NOTE — Telephone Encounter (Signed)
Please advise 

## 2018-12-20 NOTE — Telephone Encounter (Signed)
Patient was seen in office 12/13/2018 for bronchitis. Patient states he has completed all medications he was prescribed, but he is still not better. Patient wanted to know if he can get another antibiotic or should he schedule another ov? Please advise?

## 2019-01-04 ENCOUNTER — Other Ambulatory Visit: Payer: Self-pay | Admitting: Physician Assistant

## 2019-01-04 DIAGNOSIS — J440 Chronic obstructive pulmonary disease with acute lower respiratory infection: Principal | ICD-10-CM

## 2019-01-04 DIAGNOSIS — J209 Acute bronchitis, unspecified: Secondary | ICD-10-CM

## 2019-01-05 ENCOUNTER — Other Ambulatory Visit: Payer: Self-pay | Admitting: Family Medicine

## 2019-01-05 MED ORDER — PRAVASTATIN SODIUM 80 MG PO TABS: 80.0000 mg | ORAL_TABLET | Freq: Every day | ORAL | 0 refills | Status: DC

## 2019-01-05 NOTE — Telephone Encounter (Signed)
[facility] faxed refill request for the following medications: ° °pravastatin (PRAVACHOL) 80 MG tablet ° ° °Please advise. °

## 2019-01-22 ENCOUNTER — Telehealth: Payer: Self-pay | Admitting: Physician Assistant

## 2019-01-22 DIAGNOSIS — I10 Essential (primary) hypertension: Secondary | ICD-10-CM

## 2019-01-22 MED ORDER — DILTIAZEM HCL ER COATED BEADS 300 MG PO CP24: 300.0000 mg | ORAL_CAPSULE | Freq: Every day | ORAL | 1 refills | Status: DC

## 2019-01-22 NOTE — Telephone Encounter (Signed)
Please Review

## 2019-01-22 NOTE — Telephone Encounter (Signed)
[facility] faxed refill request for the following medications:  diltiazem (CARDIZEM CD) 300 MG 24 hr capsule  90 day supply Last Rx: 4/92019 pt was seeing Mikki Santee LOV: Pt saw Tawanna Sat on 12/13/2018 Please advise. Thanks TNP

## 2019-01-22 NOTE — Telephone Encounter (Signed)
Order sent to [facility] for diltiazem. He needs a CPE scheduled over the summer please

## 2019-01-22 NOTE — Telephone Encounter (Signed)
No answer unable TLM Please advise patient that he needs a CPE in the summer.

## 2019-02-15 ENCOUNTER — Other Ambulatory Visit: Payer: Self-pay | Admitting: Family Medicine

## 2019-02-15 DIAGNOSIS — K219 Gastro-esophageal reflux disease without esophagitis: Secondary | ICD-10-CM

## 2019-02-15 NOTE — Telephone Encounter (Signed)
Please review medication over allergy/interaction. KW

## 2019-02-15 NOTE — Telephone Encounter (Signed)
[facility] faxed refill request for the following medications:   omeprazole (PRILOSEC) 40 MG capsule  Please advise.  Thanks, [employer]

## 2019-02-20 MED ORDER — OMEPRAZOLE 40 MG PO CPDR: 40.0000 mg | DELAYED_RELEASE_CAPSULE | Freq: Every day | ORAL | 0 refills | Status: DC

## 2019-02-20 NOTE — Telephone Encounter (Signed)
Please schedule patient with whom he is establishing for chronic disease follow up in the next couple of months. Have refilled prilosec x 90 days.

## 2019-02-21 NOTE — Telephone Encounter (Signed)
Pt advised; Apt 04/25/2019   Thanks,    -Mickel Baas

## 2019-02-26 ENCOUNTER — Encounter: Payer: Self-pay | Admitting: Family Medicine

## 2019-02-26 ENCOUNTER — Other Ambulatory Visit: Payer: Self-pay

## 2019-02-26 ENCOUNTER — Ambulatory Visit (INDEPENDENT_AMBULATORY_CARE_PROVIDER_SITE_OTHER): Payer: Medicare Other | Admitting: Family Medicine

## 2019-02-26 VITALS — BP 124/60 | HR 72 | Temp 97.7°F | Wt 204.0 lb

## 2019-02-26 DIAGNOSIS — L539 Erythematous condition, unspecified: Secondary | ICD-10-CM | POA: Diagnosis not present

## 2019-02-26 DIAGNOSIS — W57XXXA Bitten or stung by nonvenomous insect and other nonvenomous arthropods, initial encounter: Secondary | ICD-10-CM

## 2019-02-26 NOTE — Progress Notes (Signed)
Craig [name]  MRN: [id number] DOB: [birth year]  Subjective:  HPI   The patient is a 73 year old male who presents after having a tick.  He states that 4-5 days ago he removed a tick from his right lower area of his abdomen.  He thinks he got all of the tick off because he said after removing the tick he put it in sink and saw it crawling.   He states he can't see the site so does not know if it is swollen or red.  He does admit that the area has a burning sensation.   He denies any known fever, rash, nausea or pus from the site.   Patient Active Problem List   Diagnosis Date Noted  . LVH (left ventricular hypertrophy) due to hypertensive disease, without heart failure 01/04/2018  . 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 Postoperative anemia   . Coronary artery disease involving native coronary artery of native heart   . Spondylolisthesis, lumbar region 03/16/2017  . Hyperlipemia, mixed 09/10/2015  . 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 Essential (primary) hypertension 09/10/2015  . Degeneration of cervical intervertebral disc 09/10/2015  . Allergic rhinitis 09/10/2015  . GERD (gastroesophageal reflux disease) 06/25/2015  . Cervical stenosis of spinal canal 06/25/2015  . Peripheral vascular disease ([place]) 06/25/2015    Past Medical History:  Diagnosis Date  . Allergic rhinitis   . CHF (congestive heart failure) ([place])   . Chronic cholecystitis   . Coronary artery disease   . DDD (degenerative disc disease), cervical   . Dyspnea   . Early cataract   . Erectile dysfunction   . GERD (gastroesophageal reflux disease)   . Gouty arthritis   . Headache    occasional migraines  . Hypercalcemia   . Hyperlipemia   . Hypertension   . Palpitations   . Peripheral vascular disease ([place])   . 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 Spinal stenosis of cervical region   . Vitamin D deficiency    Past Surgical  History:  Procedure Laterality Date  . BACK SURGERY  2018    fusion with screws  . CHOLECYSTECTOMY N/A 11/13/2018   Procedure: LAPAROSCOPIC CHOLECYSTECTOMY;  Surgeon: Vickie Epley, MD;  Location: [facility];  Service: General;  Laterality: N/A;  . COLONOSCOPY    . CORONARY ARTERY BYPASS GRAFT N/A 09/30/2017   Procedure: CORONARY ARTERY BYPASS GRAFTING times five using right and left Saphaneous vein harvested endoscopicly  and left internal mammary artery. (CABG),TEE;  Surgeon: Rexene Alberts, MD;  Location: [facility];  Service: Open Heart Surgery;  Laterality: N/A;  . LEFT HEART CATH AND CORONARY ANGIOGRAPHY Left 09/06/2017   Procedure: LEFT HEART CATH AND CORONARY ANGIOGRAPHY;  Surgeon: Corey Skains, MD;  Location: [facility];  Service: Cardiovascular;  Laterality: Left;  . percutaneous transluminal balloon angioplasty  01/2010   of left lower extremity  . TEE WITHOUT CARDIOVERSION N/A 09/30/2017   Procedure: TRANSESOPHAGEAL ECHOCARDIOGRAM (TEE);  Surgeon: Rexene Alberts, MD;  Location: [facility];  Service: Open Heart Surgery;  Laterality: N/A;  . VASCULAR SURGERY Left 2010   left exernal iliac and superficial femoral artery PTCA and stenting   Family History  Problem Relation Age of Onset  . Emphysema Father    Social History   Socioeconomic History  . Marital status: Married    Spouse name:  Enid Derry  . Number of children: Not on file  . Years of education: Not on file  . Highest education level: Not on file  Occupational History  . 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 Financial resource strain: Not on file  . Food insecurity:    Worry: Not on file    Inability: Not on file  . Transportation needs:    Medical: Not on file    Non-medical: Not on file  Tobacco Use  . Smoking status: Former Smoker    Packs/day: 1.00    Years: 50.00    Pack years: 50.00    Types: Cigarettes    Last attempt to quit: 09/27/2017    Years since quitting:  1.4  . Smokeless tobacco: Former Systems developer    Types: Chew  Substance and Sexual Activity  . Alcohol use: Not Currently    Comment: stopped drinking before cabg  . Drug use: Yes    Types: Marijuana    Comment: stopped smoking before CABG  . Sexual activity: Not Currently  Lifestyle  . Physical activity:    Days per week: Not on file    Minutes per session: Not on file  . Stress: Not on file  Relationships  . Social connections:    Talks on phone: Not on file    Gets together: Not on file    Attends religious service: Not on file    Active member of club or organization: Not on file    Attends meetings of clubs or organizations: Not on file    Relationship status: Not on file  . Intimate partner violence:    Fear of current or ex partner: Not on file    Emotionally abused: Not on file    Physically abused: Not on file    Forced sexual activity: Not on file  Other Topics Concern  . Not on file  Social History Narrative  . Not on file   Outpatient Encounter Medications as of 02/26/2019  Medication Sig  . acetaminophen (TYLENOL) 500 MG tablet Take 2 tablets (1,000 mg total) by mouth every 6 (six) hours. (Patient taking differently: Take 1,000 mg by mouth every 6 (six) hours as needed for moderate pain. )  . albuterol (PROVENTIL HFA;VENTOLIN HFA) 108 (90 Base) MCG/ACT inhaler TAKE 2 PUFFS BY MOUTH EVERY 6 HOURS AS NEEDED FOR WHEEZE OR SHORTNESS OF BREATH  . aspirin EC 81 MG tablet Take 81 mg by mouth daily.  [name] Kitchen azithromycin (ZITHROMAX) 250 MG tablet Take 2 tablets PO on day one, and one tablet PO daily thereafter until completed.  . benzonatate (TESSALON) 200 MG capsule Take 1 capsule (200 mg total) by mouth 2 (two) times daily as needed for cough.  . carvedilol (COREG) 6.25 MG tablet Take 6.25 mg by mouth 2 (two) times daily with a meal.  . diltiazem (CARDIZEM CD) 300 MG 24 hr capsule Take 1 capsule (300 mg total) by mouth daily.  . dorzolamide-timolol (COSOPT) 22.3-6.8 MG/ML ophthalmic  solution INSTILL ONE DROP INTO BOTH EYES TWICE DAILY  . gabapentin (NEURONTIN) 300 MG capsule TAKE 2 CAPSULES BY MOUTH 3 TIMES A DAY (Patient taking differently: Take 600 mg by mouth 3 (three) times daily. TAKE 2 CAPSULES BY MOUTH 3 TIMES A DAY)  . hydrochlorothiazide (HYDRODIURIL) 25 MG tablet Take 1 tablet (25 mg total) by mouth daily.  [name] Kitchen latanoprost (XALATAN) 0.005 % ophthalmic solution Place 1 drop into both eyes at bedtime.   . Multiple  Vitamin (MULTIVITAMIN WITH MINERALS) TABS tablet Take 1 tablet by mouth daily.  . OMEGA-3 FATTY ACIDS PO Take 1,200 mg by mouth daily.   [name] Kitchen omeprazole (PRILOSEC) 40 MG capsule Take 1 capsule (40 mg total) by mouth daily.  . pravastatin (PRAVACHOL) 80 MG tablet Take 1 tablet (80 mg total) by mouth daily.  . sucralfate (CARAFATE) 1 g tablet Take 1 tablet (1 g total) by mouth 4 (four) times daily -  with meals and at bedtime. (Patient taking differently: Take 1 g by mouth 2 (two) times daily with a meal. )  . triamcinolone cream (KENALOG) 0.1 % APPLY 1 APPLICATION TOPICALLY 2 (TWO) TIMES DAILY. TO EXTERNAL EAR  . valsartan (DIOVAN) 160 MG tablet Take 1 tablet (160 mg total) by mouth 2 (two) times daily.  . [DISCONTINUED] predniSONE (STERAPRED UNI-PAK 21 TAB) 10 MG (21) TBPK tablet 6 day taper; take as directed on package instructions   No facility-administered encounter medications on file as of 02/26/2019.     Allergies  Allergen Reactions  . Lipitor [Atorvastatin] Other (See Comments)    MYALGIA  . Zetia [Ezetimibe] Other (See Comments)    MYALGIA  . Lisinopril Cough  . Protonix [Pantoprazole] Other (See Comments)    headache  . Spironolactone Other (See Comments)    Breast tenderness   Review of Systems  Constitutional: Negative for fever.  Skin: Negative for itching and rash.    Objective:  BP 124/60 (BP Location: Right Arm, Patient Position: Sitting, Cuff Size: Normal)   Pulse 72   Temp 97.7 F (36.5 C) (Oral)   Wt 204 lb (92.5 kg)   SpO2  98%   BMI 30.13 kg/m   Physical Exam  Constitutional: He is oriented to person, place, and time and well-developed, well-nourished, and in no distress.  HENT:  Head: Normocephalic.  Eyes: Conjunctivae are normal.  Neck: Neck supple.  Cardiovascular: Normal rate and regular rhythm.  Pulmonary/Chest: Effort normal and breath sounds normal.  Abdominal: Soft. Bowel sounds are normal.  Musculoskeletal: Normal range of motion.  Neurological: He is alert and oriented to person, place, and time.  Skin: No rash noted.  Pinpoint pink spot right lower abdomen at belt line. No local lymphadenopathy or other rashes.  Psychiatric: Mood, affect and judgment normal.    Assessment and Plan :  1. Tick bite, initial encounter Pulled a tick off his right lower abdomen 02-23-19 when he got out of the shower. Thought is was a papilloma/skin tag but it crawled away in the sink. Denies rashes or fever. Will monitor for tick borne disease symptoms. Follow up as needed.

## 2019-03-09 ENCOUNTER — Ambulatory Visit (INDEPENDENT_AMBULATORY_CARE_PROVIDER_SITE_OTHER): Payer: Medicare Other | Admitting: Family Medicine

## 2019-03-09 ENCOUNTER — Other Ambulatory Visit: Payer: Self-pay

## 2019-03-09 ENCOUNTER — Encounter: Payer: Self-pay | Admitting: Family Medicine

## 2019-03-09 VITALS — BP 126/62 | HR 66 | Temp 98.6°F | Resp 16 | Wt 203.8 lb

## 2019-03-09 DIAGNOSIS — W57XXXD Bitten or stung by nonvenomous insect and other nonvenomous arthropods, subsequent encounter: Secondary | ICD-10-CM | POA: Diagnosis not present

## 2019-03-09 DIAGNOSIS — Z981 Arthrodesis status: Secondary | ICD-10-CM | POA: Diagnosis not present

## 2019-03-09 DIAGNOSIS — Z951 Presence of aortocoronary bypass graft: Secondary | ICD-10-CM

## 2019-03-09 DIAGNOSIS — R5383 Other fatigue: Secondary | ICD-10-CM | POA: Diagnosis not present

## 2019-03-09 DIAGNOSIS — I10 Essential (primary) hypertension: Secondary | ICD-10-CM

## 2019-03-09 MED ORDER — VALSARTAN 160 MG PO TABS: 160.0000 mg | ORAL_TABLET | Freq: Every day | ORAL | 3 refills | Status: DC

## 2019-03-09 NOTE — Progress Notes (Signed)
Patient: Craig [name] Male    DOB: 1946/09/15   73 y.o.   MRN: [id number] Visit Date: 03/09/2019  Today's Provider: Vernie Murders, PA   Chief Complaint  Patient presents with  . Fatigue   Subjective:     Dizziness  This is a new problem. The current episode started 1 to 4 weeks ago. The problem has been gradually worsening. Associated symptoms include arthralgias, fatigue, headaches, joint swelling, myalgias and vertigo. Pertinent negatives include no abdominal pain, anorexia, change in bowel habit, chest pain, chills, congestion, coughing, diaphoresis, fever, nausea, neck pain, numbness, rash, sore throat, swollen glands, urinary symptoms, visual change, vomiting or weakness. Nothing aggravates the symptoms. He has tried nothing for the symptoms.   Past Medical History:  Diagnosis Date  . Allergic rhinitis   . CHF (congestive heart failure) ([place])   . Chronic cholecystitis   . Coronary artery disease   . DDD (degenerative disc disease), cervical   . Dyspnea   . Early cataract   . Erectile dysfunction   . GERD (gastroesophageal reflux disease)   . Gouty arthritis   . Headache    occasional migraines  . Hypercalcemia   . Hyperlipemia   . Hypertension   . Palpitations   . Peripheral vascular disease ([place])   . 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 Spinal stenosis of cervical region   . Vitamin D deficiency    Past Surgical History:  Procedure Laterality Date  . BACK SURGERY  2018    fusion with screws  . CHOLECYSTECTOMY N/A 11/13/2018   Procedure: LAPAROSCOPIC CHOLECYSTECTOMY;  Surgeon: Vickie Epley, MD;  Location: [facility];  Service: General;  Laterality: N/A;  . COLONOSCOPY    . CORONARY ARTERY BYPASS GRAFT N/A 09/30/2017   Procedure: CORONARY ARTERY BYPASS GRAFTING times five using right and left Saphaneous vein harvested endoscopicly  and left internal mammary artery.  (CABG),TEE;  Surgeon: Rexene Alberts, MD;  Location: [place];  Service: Open Heart Surgery;  Laterality: N/A;  . LEFT HEART CATH AND CORONARY ANGIOGRAPHY Left 09/06/2017   Procedure: LEFT HEART CATH AND CORONARY ANGIOGRAPHY;  Surgeon: Corey Skains, MD;  Location: [place] [facility];  Service: Cardiovascular;  Laterality: Left;  . percutaneous transluminal balloon angioplasty  01/2010   of left lower extremity  . TEE WITHOUT CARDIOVERSION N/A 09/30/2017   Procedure: TRANSESOPHAGEAL ECHOCARDIOGRAM (TEE);  Surgeon: Rexene Alberts, MD;  Location: [place];  Service: Open Heart Surgery;  Laterality: N/A;  . VASCULAR SURGERY Left 2010   left exernal iliac and superficial femoral artery PTCA and stenting   Family History  Problem Relation Age of Onset  . Emphysema Father    Allergies  Allergen Reactions  . Lipitor [Atorvastatin] Other (See Comments)    MYALGIA  . Zetia [Ezetimibe] Other (See Comments)    MYALGIA  . Lisinopril Cough  . Protonix [Pantoprazole] Other (See Comments)    headache  . Spironolactone Other (See Comments)    Breast tenderness    Current Outpatient Medications:  .  acetaminophen (TYLENOL) 500 MG tablet, Take 2 tablets (1,000 mg total) by mouth every 6 (six) hours. (Patient taking differently: Take 1,000 mg by mouth every 6 (six) hours as needed for moderate pain. ), Disp: 30 tablet, Rfl: 0 .  albuterol (PROVENTIL HFA;VENTOLIN HFA) 108 (90 Base) MCG/ACT inhaler, TAKE 2 PUFFS BY  MOUTH EVERY 6 HOURS AS NEEDED FOR WHEEZE OR SHORTNESS OF BREATH, Disp: 6.7 Inhaler, Rfl: 0 .  aspirin EC 81 MG tablet, Take 81 mg by mouth daily., Disp: , Rfl:  .  azithromycin (ZITHROMAX) 250 MG tablet, Take 2 tablets PO on day one, and one tablet PO daily thereafter until completed., Disp: 6 tablet, Rfl: 0 .  benzonatate (TESSALON) 200 MG capsule, Take 1 capsule (200 mg total) by mouth 2 (two) times daily as needed for cough., Disp: 20 capsule, Rfl: 0 .  carvedilol (COREG) 6.25 MG  tablet, Take 6.25 mg by mouth 2 (two) times daily with a meal., Disp: , Rfl: 3 .  diltiazem (CARDIZEM CD) 300 MG 24 hr capsule, Take 1 capsule (300 mg total) by mouth daily., Disp: 90 capsule, Rfl: 1 .  dorzolamide-timolol (COSOPT) 22.3-6.8 MG/ML ophthalmic solution, INSTILL ONE DROP INTO BOTH EYES TWICE DAILY, Disp: , Rfl:  .  gabapentin (NEURONTIN) 300 MG capsule, TAKE 2 CAPSULES BY MOUTH 3 TIMES A DAY (Patient taking differently: Take 600 mg by mouth 3 (three) times daily. TAKE 2 CAPSULES BY MOUTH 3 TIMES A DAY), Disp: 180 capsule, Rfl: 5 .  hydrochlorothiazide (HYDRODIURIL) 25 MG tablet, Take 1 tablet (25 mg total) by mouth daily., Disp: 90 tablet, Rfl: 3 .  latanoprost (XALATAN) 0.005 % ophthalmic solution, Place 1 drop into both eyes at bedtime. , Disp: , Rfl: 3 .  Multiple Vitamin (MULTIVITAMIN WITH MINERALS) TABS tablet, Take 1 tablet by mouth daily., Disp: , Rfl:  .  OMEGA-3 FATTY ACIDS PO, Take 1,200 mg by mouth daily. , Disp: , Rfl:  .  omeprazole (PRILOSEC) 40 MG capsule, Take 1 capsule (40 mg total) by mouth daily., Disp: 90 capsule, Rfl: 0 .  pravastatin (PRAVACHOL) 80 MG tablet, Take 1 tablet (80 mg total) by mouth daily., Disp: 90 tablet, Rfl: 0 .  sucralfate (CARAFATE) 1 g tablet, Take 1 tablet (1 g total) by mouth 4 (four) times daily -  with meals and at bedtime. (Patient taking differently: Take 1 g by mouth 2 (two) times daily with a meal. ), Disp: 28 tablet, Rfl: 0 .  triamcinolone cream (KENALOG) 0.1 %, APPLY 1 APPLICATION TOPICALLY 2 (TWO) TIMES DAILY. TO EXTERNAL EAR, Disp: 30 g, Rfl: 0 .  valsartan (DIOVAN) 160 MG tablet, Take 1 tablet (160 mg total) by mouth 2 (two) times daily., Disp: 180 tablet, Rfl: 3  Review of Systems  Constitutional: Positive for fatigue. Negative for chills, diaphoresis and fever.  HENT: Negative.  Negative for congestion and sore throat.   Respiratory: Negative.  Negative for cough.   Cardiovascular: Negative.  Negative for chest pain.   Gastrointestinal: Negative.  Negative for abdominal pain, anorexia, change in bowel habit, nausea and vomiting.  Genitourinary: Negative.   Musculoskeletal: Positive for arthralgias, joint swelling and myalgias. Negative for neck pain.  Skin: Negative.  Negative for rash.  Neurological: Positive for dizziness (when bending foward), vertigo and headaches. Negative for weakness and numbness.    Social History   Tobacco Use  . Smoking status: Former Smoker    Packs/day: 1.00    Years: 50.00    Pack years: 50.00    Types: Cigarettes    Last attempt to quit: 09/27/2017    Years since quitting: 1.4  . Smokeless tobacco: Former Systems developer    Types: Chew  Substance Use Topics  . Alcohol use: Not Currently    Comment: stopped drinking before cabg      Objective:  BP 126/62   Pulse 66   Temp 98.6 F (37 C) (Oral)   Resp 16   Wt 203 lb 12.8 oz (92.4 kg)   BMI 30.10 kg/m    Wt Readings from Last 3 Encounters:  03/09/19 203 lb 12.8 oz (92.4 kg)  02/26/19 204 lb (92.5 kg)  12/13/18 198 lb 12.8 oz (90.2 kg)   . Vitals:   03/09/19 1109  BP: 126/62  Pulse: 66  Resp: 16  Temp: 98.6 F (37 C)  TempSrc: Oral  Weight: 203 lb 12.8 oz (92.4 kg)   Physical Exam Constitutional:      General: He is not in acute distress.    Appearance: He is well-developed.  HENT:     Head: Normocephalic and atraumatic.     Right Ear: Hearing normal.     Left Ear: Hearing normal.     Nose: Nose normal.     Mouth/Throat:     Pharynx: Oropharynx is clear.  Eyes:     General: Lids are normal. No scleral icterus.       Right eye: No discharge.        Left eye: No discharge.     Conjunctiva/sclera: Conjunctivae normal.  Cardiovascular:     Rate and Rhythm: Normal rate and regular rhythm.     Heart sounds: Normal heart sounds.  Pulmonary:     Effort: Pulmonary effort is normal. No respiratory distress.     Breath sounds: Normal breath sounds.  Abdominal:     General: Bowel sounds are normal.      Palpations: Abdomen is soft.  Musculoskeletal: Normal range of motion.  Skin:    Findings: No lesion or rash.     Comments: No tick bites today.  Neurological:     Mental Status: He is alert and oriented to person, place, and time.  Psychiatric:        Speech: Speech normal.        Behavior: Behavior normal.        Thought Content: Thought content normal.       Assessment & Plan    1. Fatigue, unspecified type Feels fatigue is more prominent since the recent tick bite on the back of the right buttock (the one on 02-26-19 was on the right lower abdomen). No fever, rash or peripheral edema. BP a little low today and thinks fatigue may be due to some of his BP meds. Will decrease Valsartan to once a day and get labs to rule out tick borne diseases, electrolyte imbalances or anemia. Follow up pending reports. - CBC with Differential/Platelet - Comprehensive metabolic panel - Rocky mtn spotted fvr ab, IgG-blood - Lyme Ab/Western Blot Reflex - valsartan (DIOVAN) 160 MG tablet; Take 1 tablet (160 mg total) by mouth daily.  Dispense: 180 tablet; Refill: 3  2. Tick bite, subsequent encounter Had another tick bite since his last OV on 02-26-19 with a tick bite. Past bite site has healed and no rash or tenderness at the new site. Will check for tick borne disease and need for possible antibiotics. - CBC with Differential/Platelet - Comprehensive metabolic panel - Rocky mtn spotted fvr ab, IgG-blood - Lyme Ab/Western Blot Reflex  3. S/P CABG x 5 Had bypass surgery 09-30-17 by Dr. Roxy Manns ([facility]). No significant chest pains or dyspnea recently. Chest scars well healed. Vein harvesting scars in right lower leg healed. Has follow up planned with Dr. Nehemiah Massed (cardiologist) on 04-09-19.  4. History of lumbar fusion Had lumbar fusion with  screws in 2018. Still has some aches in hips and lower legs with pin prick sensations in feet bilaterally.  5. Essential (primary) hypertension BP a little low today  and was at the last OV on 02-26-19. Presently taking Coreg 6.25 mg BID, Cardizem-CD 300 mg qd,HCTZ 25 mg qd and Diovan 160 mg BID. Will decrease Diovan to 160 mg once a day and check routine labs. He will monitor BP at home and plan follow up pending reports. - CBC with Differential/Platelet - Comprehensive metabolic panel - valsartan (DIOVAN) 160 MG tablet; Take 1 tablet (160 mg total) by mouth daily.  Dispense: 180 tablet; Refill: [place], [facility] Fritzi Mandes Sherman as a Education administrator for [employer], [employer].,have documented all relevant documentation on the behalf of Vernie Murders, PA,as directed by  [employer] while in the presence of [employer], [place].

## 2019-03-14 LAB — LYME AB/WESTERN BLOT REFLEX
LYME DISEASE AB, QUANT, IGM: <0.8 index (ref 0.00–0.79)
Lyme IgG/IgM Ab: <0.91 {ISR} (ref 0.00–0.90)

## 2019-03-14 LAB — CBC WITH DIFFERENTIAL/PLATELET
Basophils Absolute: 0.1 10*3/uL (ref 0.0–0.2)
Basos: 1 %
EOS (ABSOLUTE): 0.2 10*3/uL (ref 0.0–0.4)
Eos: 4 %
Hematocrit: 31.8 % — ABNORMAL LOW (ref 37.5–51.0)
Hemoglobin: 10 g/dL — ABNORMAL LOW (ref 13.0–17.7)
Immature Grans (Abs): 0 10*3/uL (ref 0.0–0.1)
Immature Granulocytes: 0 %
Lymphocytes Absolute: 1.3 10*3/uL (ref 0.7–3.1)
Lymphs: 28 %
MCH: 25.3 pg — ABNORMAL LOW (ref 26.6–33.0)
MCHC: 31.4 g/dL — ABNORMAL LOW (ref 31.5–35.7)
MCV: 81 fL (ref 79–97)
Monocytes Absolute: 0.4 10*3/uL (ref 0.1–0.9)
Monocytes: 9 %
Neutrophils Absolute: 2.7 10*3/uL (ref 1.4–7.0)
Neutrophils: 58 %
Platelets: 339 10*3/uL (ref 150–450)
RBC: 3.95 x10E6/uL — ABNORMAL LOW (ref 4.14–5.80)
RDW: 15.5 % — ABNORMAL HIGH (ref 11.6–15.4)
WBC: 4.6 10*3/uL (ref 3.4–10.8)

## 2019-03-14 LAB — COMPREHENSIVE METABOLIC PANEL
ALT: 15 IU/L (ref 0–44)
AST: 19 IU/L (ref 0–40)
Albumin/Globulin Ratio: 1.9 (ref 1.2–2.2)
Albumin: 4.7 g/dL (ref 3.7–4.7)
Alkaline Phosphatase: 77 IU/L (ref 39–117)
BUN/Creatinine Ratio: 13 (ref 10–24)
BUN: 16 mg/dL (ref 8–27)
Bilirubin Total: 0.3 mg/dL (ref 0.0–1.2)
CO2: 20 mmol/L (ref 20–29)
Calcium: 9.7 mg/dL (ref 8.6–10.2)
Chloride: 102 mmol/L (ref 96–106)
Creatinine, Ser: 1.21 mg/dL (ref 0.76–1.27)
GFR calc Af Amer: 69 mL/min/{1.73_m2} (ref >59)
GFR calc non Af Amer: 59 mL/min/{1.73_m2} — ABNORMAL LOW (ref >59)
Globulin, Total: 2.5 g/dL (ref 1.5–4.5)
Glucose: 117 mg/dL — ABNORMAL HIGH (ref 65–99)
Potassium: 4.4 mmol/L (ref 3.5–5.2)
Sodium: 138 mmol/L (ref 134–144)
Total Protein: 7.2 g/dL (ref 6.0–8.5)

## 2019-03-14 LAB — ROCKY MTN SPOTTED FVR AB, IGG-BLOOD: RMSF IgG: NEGATIVE

## 2019-03-16 ENCOUNTER — Other Ambulatory Visit: Payer: Self-pay

## 2019-03-16 DIAGNOSIS — D649 Anemia, unspecified: Secondary | ICD-10-CM

## 2019-03-20 ENCOUNTER — Telehealth: Payer: Self-pay

## 2019-03-20 DIAGNOSIS — D649 Anemia, unspecified: Secondary | ICD-10-CM

## 2019-03-20 DIAGNOSIS — R5383 Other fatigue: Secondary | ICD-10-CM

## 2019-03-20 LAB — VITAMIN B12: Vitamin B-12: 804 pg/mL (ref 232–1245)

## 2019-03-20 LAB — FOLATE: Folate: 11.9 ng/mL (ref >3.0)

## 2019-03-20 LAB — FERRITIN: Ferritin: 18 ng/mL — ABNORMAL LOW (ref 30–400)

## 2019-03-20 LAB — IRON AND TIBC
Iron Saturation: 12 % — ABNORMAL LOW (ref 15–55)
Iron: 50 ug/dL (ref 38–169)
Total Iron Binding Capacity: 413 ug/dL (ref 250–450)
UIBC: 363 ug/dL — ABNORMAL HIGH (ref 111–343)

## 2019-03-20 MED ORDER — FERROUS SULFATE 325 (65 FE) MG PO TABS: 325.0000 mg | ORAL_TABLET | Freq: Every day | ORAL | 3 refills | Status: DC

## 2019-03-20 NOTE — Telephone Encounter (Signed)
-----   Message from Margo Common, [place] sent at 03/20/2019  8:21 AM EDT ----- Iron saturation and ferritin levels are low. Normal B12 and folate levels. Be sure to do OC-Light test at home to check for GI blood loss and start Ferrous Sulfate 325 mg qd #30 & 3 RF. Recheck CBC and ferritin level in 3 months.

## 2019-03-20 NOTE — Telephone Encounter (Signed)
Patient advised as directed below.Prescription sent to [facility] in [place]

## 2019-03-22 ENCOUNTER — Other Ambulatory Visit (INDEPENDENT_AMBULATORY_CARE_PROVIDER_SITE_OTHER): Payer: Medicare Other

## 2019-03-22 DIAGNOSIS — D649 Anemia, unspecified: Secondary | ICD-10-CM

## 2019-03-22 LAB — IFOBT (OCCULT BLOOD): IFOBT: NEGATIVE

## 2019-04-02 ENCOUNTER — Other Ambulatory Visit: Payer: Self-pay | Admitting: Physician Assistant

## 2019-04-02 NOTE — Telephone Encounter (Signed)
Recommend he keep the appointment with Dr. Nehemiah Massed (cardiologist) to recheck lipids and liver function to refill this statin.

## 2019-04-02 NOTE — Telephone Encounter (Signed)
Craig [name] has been listed as his PCP but he has only seen you since [place] left.  JB

## 2019-04-04 NOTE — Telephone Encounter (Signed)
Advised 

## 2019-04-18 ENCOUNTER — Other Ambulatory Visit: Payer: Self-pay

## 2019-04-20 MED ORDER — GABAPENTIN 300 MG PO CAPS: 600.0000 mg | ORAL_CAPSULE | Freq: Three times a day (TID) | ORAL | 0 refills | Status: DC

## 2019-04-25 ENCOUNTER — Ambulatory Visit: Payer: Medicare Other | Admitting: Physician Assistant

## 2019-04-27 ENCOUNTER — Other Ambulatory Visit: Payer: Self-pay

## 2019-04-27 ENCOUNTER — Telehealth: Payer: Self-pay | Admitting: *Deleted

## 2019-04-27 ENCOUNTER — Ambulatory Visit (INDEPENDENT_AMBULATORY_CARE_PROVIDER_SITE_OTHER): Payer: Medicare Other | Admitting: Physician Assistant

## 2019-04-27 ENCOUNTER — Encounter: Payer: Self-pay | Admitting: Physician Assistant

## 2019-04-27 VITALS — BP 154/80 | HR 75 | Temp 98.1°F | Resp 18 | Wt 200.2 lb

## 2019-04-27 DIAGNOSIS — Z951 Presence of aortocoronary bypass graft: Secondary | ICD-10-CM

## 2019-04-27 DIAGNOSIS — E782 Mixed hyperlipidemia: Secondary | ICD-10-CM

## 2019-04-27 DIAGNOSIS — R351 Nocturia: Secondary | ICD-10-CM | POA: Diagnosis not present

## 2019-04-27 DIAGNOSIS — D649 Anemia, unspecified: Secondary | ICD-10-CM | POA: Diagnosis not present

## 2019-04-27 DIAGNOSIS — R7303 Prediabetes: Secondary | ICD-10-CM

## 2019-04-27 DIAGNOSIS — Z720 Tobacco use: Secondary | ICD-10-CM

## 2019-04-27 MED ORDER — ROSUVASTATIN CALCIUM 10 MG PO TABS: 10.0000 mg | ORAL_TABLET | Freq: Every day | ORAL | 0 refills | Status: DC

## 2019-04-27 NOTE — Telephone Encounter (Signed)
Received referral for low dose lung cancer screening CT scan. Message left at phone number listed in EMR for patient to call me back to facilitate scheduling scan.  

## 2019-04-27 NOTE — Progress Notes (Signed)
Patient: Craig [name] Male    DOB: 21-Nov-1945   73 y.o.   MRN: [id number] Visit Date: 05/01/2019  Today's Provider: Trinna Post, PA-C   Chief Complaint  Patient presents with  . Establish Care   Subjective:     HPI  Patient is here to become established with new provider. He previously saw Mariel Sleet, PA-C who has since retired.  Living around [place] with wife. Wife has four grown children. Currently not working, previously worked at [employer].  CABG x 5: Followed by Dr. Nehemiah Massed in Cardiology   HTN: carvedilol 6.25 mg BID, cardizem 300 mg QD, valsartan 160 mg daily, hctz 25 mg QD.  HLD: pravastatin 80 mg with LDL > 100. Lipitor causes myalgias. History of CABG x 5 as above.   Lipid Panel     Component Value Date/Time   CHOL 180 04/27/2019 0940   TRIG 149 04/27/2019 0940   HDL 47 04/27/2019 0940   CHOLHDL 3.8 04/27/2019 0940   LDLCALC 103 (H) 04/27/2019 0940     Former smoker - 1 pack a day since around age 23. Quit smoking 2 years ago.   Anemia: Has been anemic for two years. Lowest documented hemoglobin was 7.7 in 2018 and most recently it is 10. Serum iron normal 03/2019 but ferritin low. Patient seen for this issue one month ago. Advised to take ferrous sulfate 325 mg TID but he unsure if he is taking ferrous sulfate supplements, he doesn't believe he is. He did a hemoccult test which was negative. Last colonoscopy 2011 normal with internal hemorrhoids. Denies blood in stool, black tarry stools. Denies hematemesis.  CBC Latest Ref Rng & Units 04/27/2019 03/12/2019 11/06/2018  WBC 3.4 - 10.8 x10E3/uL 5.4 4.6 5.3  Hemoglobin 13.0 - 17.7 g/dL 11.3(L) 10.0(L) 10.5(L)  Hematocrit 37.5 - 51.0 % 34.8(L) 31.8(L) 35.0(L)  Platelets 150 - 450 x10E3/uL 290 339 325   Prediabetes: Drinks roughly four muontain dews a day and does not follow a low sugar diet.  Lab Results  Component Value Date   HGBA1C 6.0 (H) 04/27/2019    Gabapentin: 600 mg TID for pain   Allergies  Allergen Reactions  . Lipitor [Atorvastatin] Other (See Comments)    MYALGIA  . Zetia [Ezetimibe] Other (See Comments)    MYALGIA  . Lisinopril Cough  . Protonix [Pantoprazole] Other (See Comments)    headache  . Spironolactone Other (See Comments)    Breast tenderness     Current Outpatient Medications:  .  albuterol (PROVENTIL HFA;VENTOLIN HFA) 108 (90 Base) MCG/ACT inhaler, TAKE 2 PUFFS BY MOUTH EVERY 6 HOURS AS NEEDED FOR WHEEZE OR SHORTNESS OF BREATH, Disp: 6.7 Inhaler, Rfl: 0 .  aspirin EC 81 MG tablet, Take 81 mg by mouth daily., Disp: , Rfl:  .  carvedilol (COREG) 6.25 MG tablet, Take 6.25 mg by mouth 2 (two) times daily with a meal., Disp: , Rfl: 3 .  diltiazem (CARDIZEM CD) 300 MG 24 hr capsule, Take 1 capsule (300 mg total) by mouth daily., Disp: 90 capsule, Rfl: 1 .  dorzolamide-timolol (COSOPT) 22.3-6.8 MG/ML ophthalmic solution, INSTILL ONE DROP INTO BOTH EYES TWICE DAILY, Disp: , Rfl:  .  gabapentin (NEURONTIN) 300 MG capsule, Take 2 capsules (600 mg total) by mouth 3 (three) times daily., Disp: 540 capsule, Rfl: 0 .  hydrochlorothiazide (HYDRODIURIL) 25 MG tablet, Take 1 tablet (25 mg total) by mouth daily., Disp: 90 tablet, Rfl: 3 .  latanoprost (XALATAN) 0.005 % ophthalmic  solution, Place 1 drop into both eyes at bedtime. , Disp: , Rfl: 3 .  Multiple Vitamin (MULTIVITAMIN WITH MINERALS) TABS tablet, Take 1 tablet by mouth daily., Disp: , Rfl:  .  OMEGA-3 FATTY ACIDS PO, Take 1,200 mg by mouth daily. , Disp: , Rfl:  .  sucralfate (CARAFATE) 1 g tablet, Take 1 tablet (1 g total) by mouth 4 (four) times daily -  with meals and at bedtime. (Patient taking differently: Take 1 g by mouth 2 (two) times daily with a meal. ), Disp: 28 tablet, Rfl: 0 .  triamcinolone cream (KENALOG) 0.1 %, APPLY 1 APPLICATION TOPICALLY 2 (TWO) TIMES DAILY. TO EXTERNAL EAR, Disp: 30 g, Rfl: 0 .  valsartan (DIOVAN) 160 MG tablet, Take 1 tablet (160 mg total) by mouth daily., Disp: 180  tablet, Rfl: 3 .  acetaminophen (TYLENOL) 500 MG tablet, Take 2 tablets (1,000 mg total) by mouth every 6 (six) hours. (Patient taking differently: Take 1,000 mg by mouth every 6 (six) hours as needed for moderate pain. ), Disp: 30 tablet, Rfl: 0 .  omeprazole (PRILOSEC) 40 MG capsule, Take 1 capsule (40 mg total) by mouth daily., Disp: 90 capsule, Rfl: 0 .  rosuvastatin (CRESTOR) 10 MG tablet, Take 1 tablet (10 mg total) by mouth daily., Disp: 90 tablet, Rfl: 0  Review of Systems  All other systems reviewed and are negative.   Social History   Tobacco Use  . Smoking status: Former Smoker    Packs/day: 0.75    Years: 50.00    Pack years: 37.50    Types: Cigarettes    Quit date: 09/27/2017    Years since quitting: 1.5  . Smokeless tobacco: Former Systems developer    Types: Chew  Substance Use Topics  . Alcohol use: Not Currently    Comment: stopped drinking before cabg      Objective:   BP (!) 154/80 (BP Location: Right Arm, Patient Position: Sitting, Cuff Size: Large)   Pulse 75   Temp 98.1 F (36.7 C) (Oral)   Resp 18   Wt 200 lb 3.2 oz (90.8 kg)   SpO2 98%   BMI 29.56 kg/m  Vitals:   04/27/19 0903  BP: (!) 154/80  Pulse: 75  Resp: 18  Temp: 98.1 F (36.7 C)  TempSrc: Oral  SpO2: 98%  Weight: 200 lb 3.2 oz (90.8 kg)     Physical Exam Constitutional:      Appearance: Normal appearance.  Cardiovascular:     Rate and Rhythm: Normal rate and regular rhythm.     Heart sounds: Normal heart sounds.  Pulmonary:     Breath sounds: Normal breath sounds.  Abdominal:     General: Bowel sounds are normal.     Palpations: Abdomen is soft.  Skin:    General: Skin is warm and dry.  Neurological:     Mental Status: He is alert.  Psychiatric:        Mood and Affect: Mood normal.        Behavior: Behavior normal.      Results for orders placed or performed in visit on 04/27/19  PSA  Result Value Ref Range   Prostate Specific Ag, Serum 0.8 0.0 - 4.0 ng/mL  HgB A1c   Result Value Ref Range   Hgb A1c MFr Bld 6.0 (H) 4.8 - 5.6 %   Est. average glucose Bld gHb Est-mCnc 126 mg/dL  Lipid Profile  Result Value Ref Range   Cholesterol, Total 180 100 - 199  mg/dL   Triglycerides 149 0 - 149 mg/dL   HDL 47 >39 mg/dL   VLDL Cholesterol Cal 30 5 - 40 mg/dL   LDL Calculated 103 (H) 0 - 99 mg/dL   Chol/HDL Ratio 3.8 0.0 - 5.0 ratio  CBC with Differential  Result Value Ref Range   WBC 5.4 3.4 - 10.8 x10E3/uL   RBC 4.15 4.14 - 5.80 x10E6/uL   Hemoglobin 11.3 (L) 13.0 - 17.7 g/dL   Hematocrit 34.8 (L) 37.5 - 51.0 %   MCV 84 79 - 97 fL   MCH 27.2 26.6 - 33.0 pg   MCHC 32.5 31.5 - 35.7 g/dL   RDW 15.5 (H) 11.6 - 15.4 %   Platelets 290 150 - 450 x10E3/uL   Neutrophils 62 Not Estab. %   Lymphs 26 Not Estab. %   Monocytes 8 Not Estab. %   Eos 3 Not Estab. %   Basos 1 Not Estab. %   Neutrophils Absolute 3.3 1.4 - 7.0 x10E3/uL   Lymphocytes Absolute 1.4 0.7 - 3.1 x10E3/uL   Monocytes Absolute 0.5 0.1 - 0.9 x10E3/uL   EOS (ABSOLUTE) 0.2 0.0 - 0.4 x10E3/uL   Basophils Absolute 0.1 0.0 - 0.2 x10E3/uL   Immature Granulocytes 0 Not Estab. %   Immature Grans (Abs) 0.0 0.0 - 0.1 x10E3/uL  Fe+TIBC+Fer  Result Value Ref Range   Total Iron Binding Capacity 363 250 - 450 ug/dL   UIBC 244 111 - 343 ug/dL   Iron 119 38 - 169 ug/dL   Iron Saturation 33 15 - 55 %   Ferritin 58 30 - 400 ng/mL       Assessment & Plan    1. Hyperlipemia, mixed  LDL is above goal and patient has history significant for CABG x 5. Will change pravastatin to crestor since lipitor caused myalgias. If cholesterol not under control, may have to consider injectable cholesterol medication. F/u 6 weeks for OV and repeat labs.  - rosuvastatin (CRESTOR) 10 MG tablet; Take 1 tablet (10 mg total) by mouth daily.  Dispense: 90 tablet; Refill: 0 - Lipid Profile  2. S/P CABG x 5   3. Nocturia  - PSA  4. Anemia, unspecified type  He has a history of iron deficiency anemia. Unclear if he  continues to take iron supplement. Most recently blood counts have improved though patient remains anemic. Will refer to GI for further workup.  - CBC with Differential - Fe+TIBC+Fer  5. Prediabetes  Drinks four mountain dews daily and does not adhere to low sugar diet. Counseled patient on eliminating mountain dews.  - HgB A1c  6. Tobacco abuse  - Ambulatory Referral for Lung Cancer Scre  The entirety of the information documented in the History of Present Illness, Review of Systems and Physical Exam were personally obtained by me. Portions of this information were initially documented by Doran Clay, LPN and reviewed by me for thoroughness and accuracy.          Trinna Post, PA-C  [facility]

## 2019-04-28 LAB — CBC WITH DIFFERENTIAL/PLATELET
Basophils Absolute: 0.1 10*3/uL (ref 0.0–0.2)
Basos: 1 %
EOS (ABSOLUTE): 0.2 10*3/uL (ref 0.0–0.4)
Eos: 3 %
Hematocrit: 34.8 % — ABNORMAL LOW (ref 37.5–51.0)
Hemoglobin: 11.3 g/dL — ABNORMAL LOW (ref 13.0–17.7)
Immature Grans (Abs): 0 10*3/uL (ref 0.0–0.1)
Immature Granulocytes: 0 %
Lymphocytes Absolute: 1.4 10*3/uL (ref 0.7–3.1)
Lymphs: 26 %
MCH: 27.2 pg (ref 26.6–33.0)
MCHC: 32.5 g/dL (ref 31.5–35.7)
MCV: 84 fL (ref 79–97)
Monocytes Absolute: 0.5 10*3/uL (ref 0.1–0.9)
Monocytes: 8 %
Neutrophils Absolute: 3.3 10*3/uL (ref 1.4–7.0)
Neutrophils: 62 %
Platelets: 290 10*3/uL (ref 150–450)
RBC: 4.15 x10E6/uL (ref 4.14–5.80)
RDW: 15.5 % — ABNORMAL HIGH (ref 11.6–15.4)
WBC: 5.4 10*3/uL (ref 3.4–10.8)

## 2019-04-28 LAB — PSA: Prostate Specific Ag, Serum: 0.8 ng/mL (ref 0.0–4.0)

## 2019-04-28 LAB — LIPID PANEL
Chol/HDL Ratio: 3.8 ratio (ref 0.0–5.0)
Cholesterol, Total: 180 mg/dL (ref 100–199)
HDL: 47 mg/dL (ref >39)
LDL Calculated: 103 mg/dL — ABNORMAL HIGH (ref 0–99)
Triglycerides: 149 mg/dL (ref 0–149)
VLDL Cholesterol Cal: 30 mg/dL (ref 5–40)

## 2019-04-28 LAB — IRON,TIBC AND FERRITIN PANEL
Ferritin: 58 ng/mL (ref 30–400)
Iron Saturation: 33 % (ref 15–55)
Iron: 119 ug/dL (ref 38–169)
Total Iron Binding Capacity: 363 ug/dL (ref 250–450)
UIBC: 244 ug/dL (ref 111–343)

## 2019-04-28 LAB — HEMOGLOBIN A1C
Est. average glucose Bld gHb Est-mCnc: 126 mg/dL
Hgb A1c MFr Bld: 6 % — ABNORMAL HIGH (ref 4.8–5.6)

## 2019-04-30 ENCOUNTER — Encounter: Payer: Self-pay | Admitting: *Deleted

## 2019-04-30 ENCOUNTER — Telehealth: Payer: Self-pay | Admitting: *Deleted

## 2019-04-30 DIAGNOSIS — Z122 Encounter for screening for malignant neoplasm of respiratory organs: Secondary | ICD-10-CM

## 2019-04-30 DIAGNOSIS — Z87891 Personal history of nicotine dependence: Secondary | ICD-10-CM

## 2019-04-30 NOTE — Telephone Encounter (Signed)
Received referral for initial lung cancer screening scan. Contacted patient and obtained smoking history,(former, quit 09/27/17, 37.5 pack year) as well as answering questions related to screening process. Patient denies signs of lung cancer such as weight loss or hemoptysis. Patient denies comorbidity that would prevent curative treatment if lung cancer were found. Patient is scheduled for shared decision making visit and CT scan on 05/10/19 at 130pm.

## 2019-05-01 DIAGNOSIS — R7303 Prediabetes: Secondary | ICD-10-CM | POA: Insufficient documentation

## 2019-05-01 NOTE — Patient Instructions (Signed)
Heart-Healthy Eating Plan °Heart-healthy meal planning includes: °· Eating less unhealthy fats. °· Eating more healthy fats. °· Making other changes in your diet. °Talk with your doctor or a diet specialist (dietitian) to create an eating plan that is right for you. °What is my plan? °Your doctor may recommend an eating plan that includes: °· Total fat: ______% or less of total calories a day. °· Saturated fat: ______% or less of total calories a day. °· Cholesterol: less than _________mg a day. °What are tips for following this plan? °Cooking °Avoid frying your food. Try to bake, boil, grill, or broil it instead. You can also reduce fat by: °· Removing the skin from poultry. °· Removing all visible fats from meats. °· Steaming vegetables in water or broth. °Meal planning ° °· At meals, divide your plate into four equal parts: °? Fill one-half of your plate with vegetables and green salads. °? Fill one-fourth of your plate with whole grains. °? Fill one-fourth of your plate with lean protein foods. °· Eat 4-5 servings of vegetables per day. A serving of vegetables is: °? 1 cup of raw or cooked vegetables. °? 2 cups of raw leafy greens. °· Eat 4-5 servings of fruit per day. A serving of fruit is: °? 1 medium whole fruit. °? ¼ cup of dried fruit. °? ½ cup of fresh, frozen, or canned fruit. °? ½ cup of 100% fruit juice. °· Eat more foods that have soluble fiber. These are apples, broccoli, carrots, beans, peas, and barley. Try to get 20-30 g of fiber per day. °· Eat 4-5 servings of nuts, legumes, and seeds per week: °? 1 serving of dried beans or legumes equals ½ cup after being cooked. °? 1 serving of nuts is ¼ cup. °? 1 serving of seeds equals 1 tablespoon. °General information °· Eat more home-cooked food. Eat less restaurant, buffet, and fast food. °· Limit or avoid alcohol. °· Limit foods that are high in starch and sugar. °· Avoid fried foods. °· Lose weight if you are overweight. °· Keep track of how much salt  (sodium) you eat. This is important if you have high blood pressure. Ask your doctor to tell you more about this. °· Try to add vegetarian meals each week. °Fats °· Choose healthy fats. These include olive oil and canola oil, flaxseeds, walnuts, almonds, and seeds. °· Eat more omega-3 fats. These include salmon, mackerel, sardines, tuna, flaxseed oil, and ground flaxseeds. Try to eat fish at least 2 times each week. °· Check food labels. Avoid foods with trans fats or high amounts of saturated fat. °· Limit saturated fats. °? These are often found in animal products, such as meats, butter, and cream. °? These are also found in plant foods, such as palm oil, palm kernel oil, and coconut oil. °· Avoid foods with partially hydrogenated oils in them. These have trans fats. Examples are stick margarine, some tub margarines, cookies, crackers, and other baked goods. °What foods can I eat? °Fruits °All fresh, canned (in natural juice), or frozen fruits. °Vegetables °Fresh or frozen vegetables (raw, steamed, roasted, or grilled). Green salads. °Grains °Most grains. Choose whole wheat and whole grains most of the time. Rice and pasta, including brown rice and pastas made with whole wheat. °Meats and other proteins °Lean, well-trimmed beef, veal, pork, and lamb. Chicken and turkey without skin. All fish and shellfish. Wild duck, rabbit, pheasant, and venison. Egg whites or low-cholesterol egg substitutes. Dried beans, peas, lentils, and tofu. Seeds and most   nuts. °Dairy °Low-fat or nonfat cheeses, including ricotta and mozzarella. Skim or 1% milk that is liquid, powdered, or evaporated. Buttermilk that is made with low-fat milk. Nonfat or low-fat yogurt. °Fats and oils °Non-hydrogenated (trans-free) margarines. Vegetable oils, including soybean, sesame, sunflower, olive, peanut, safflower, corn, canola, and cottonseed. Salad dressings or mayonnaise made with a vegetable oil. °Beverages °Mineral water. Coffee and tea. Diet  carbonated beverages. °Sweets and desserts °Sherbet, gelatin, and fruit ice. Small amounts of dark chocolate. °Limit all sweets and desserts. °Seasonings and condiments °All seasonings and condiments. °The items listed above may not be a complete list of foods and drinks you can eat. Contact a dietitian for more options. °What foods should I avoid? °Fruits °Canned fruit in heavy syrup. Fruit in cream or butter sauce. Fried fruit. Limit coconut. °Vegetables °Vegetables cooked in cheese, cream, or butter sauce. Fried vegetables. °Grains °Breads that are made with saturated or trans fats, oils, or whole milk. Croissants. Sweet rolls. Donuts. High-fat crackers, such as cheese crackers. °Meats and other proteins °Fatty meats, such as hot dogs, ribs, sausage, bacon, rib-eye roast or steak. High-fat deli meats, such as salami and bologna. Caviar. Domestic duck and goose. Organ meats, such as liver. °Dairy °Cream, sour cream, cream cheese, and creamed cottage cheese. Whole-milk cheeses. Whole or 2% milk that is liquid, evaporated, or condensed. Whole buttermilk. Cream sauce or high-fat cheese sauce. Yogurt that is made from whole milk. °Fats and oils °Meat fat, or shortening. Cocoa butter, hydrogenated oils, palm oil, coconut oil, palm kernel oil. Solid fats and shortenings, including bacon fat, salt pork, lard, and butter. Nondairy cream substitutes. Salad dressings with cheese or sour cream. °Beverages °Regular sodas and juice drinks with added sugar. °Sweets and desserts °Frosting. Pudding. Cookies. Cakes. Pies. Milk chocolate or white chocolate. Buttered syrups. Full-fat ice cream or ice cream drinks. °The items listed above may not be a complete list of foods and drinks to avoid. Contact a dietitian for more information. °Summary °· Heart-healthy meal planning includes eating less unhealthy fats, eating more healthy fats, and making other changes in your diet. °· Eat a balanced diet. This includes fruits and  vegetables, low-fat or nonfat dairy, lean protein, nuts and legumes, whole grains, and heart-healthy oils and fats. °This information is not intended to replace advice given to you by your health care provider. Make sure you discuss any questions you have with your health care provider. °Document Released: 03/28/2012 Document Revised: 12/01/2017 Document Reviewed: 11/04/2017 °Elsevier Patient Education © 2020 Elsevier Inc. ° °

## 2019-05-02 ENCOUNTER — Encounter: Payer: Self-pay | Admitting: Physician Assistant

## 2019-05-02 ENCOUNTER — Ambulatory Visit (INDEPENDENT_AMBULATORY_CARE_PROVIDER_SITE_OTHER): Payer: Medicare Other | Admitting: Physician Assistant

## 2019-05-02 ENCOUNTER — Telehealth: Payer: Self-pay | Admitting: Physician Assistant

## 2019-05-02 ENCOUNTER — Telehealth: Payer: Self-pay

## 2019-05-02 ENCOUNTER — Other Ambulatory Visit: Payer: Self-pay

## 2019-05-02 DIAGNOSIS — E782 Mixed hyperlipidemia: Secondary | ICD-10-CM | POA: Diagnosis not present

## 2019-05-02 DIAGNOSIS — I739 Peripheral vascular disease, unspecified: Secondary | ICD-10-CM | POA: Diagnosis not present

## 2019-05-02 DIAGNOSIS — D509 Iron deficiency anemia, unspecified: Secondary | ICD-10-CM

## 2019-05-02 DIAGNOSIS — Z951 Presence of aortocoronary bypass graft: Secondary | ICD-10-CM | POA: Diagnosis not present

## 2019-05-02 NOTE — Telephone Encounter (Signed)
Schedule him a visit, that's not enough information for me to make any sort of recommendation.

## 2019-05-02 NOTE — Progress Notes (Signed)
Patient: Craig [name] Male    DOB: 10-11-1946   73 y.o.   MRN: [id number] Visit Date: 05/02/2019  Today's Provider: Trinna Post, PA-C   Chief Complaint  Patient presents with  . Leg Pain   Subjective:    Virtual Visit via Telephone Note  I connected with [name] on 05/02/19 at  1:20 PM EDT by telephone and verified that I am speaking with the correct person using two identifiers.   I discussed the limitations, risks, security and privacy concerns of performing an evaluation and management service by telephone and the availability of in person appointments. I also discussed with the patient that there may be a patient responsible charge related to this service. The patient expressed understanding and agreed to proceed.  Patient location: home Provider location: [place] office  Persons involved in the visit: patient, provider   Patient with history of CABG x 5, HLD, tobacco abuse, and bilateral lower extremity stents presents today with worsening of lower extremity symptoms. He reports pain that starts in his hip and will travel down to his leg when he walks for longer than 20 minutes. He reports if he rests, the pain will completely resolve. The pain is not shooting, he denies injuries. The last time he looked at his feet yesterday he reports they were normal in color and not cool to the touch. He is unable to look at them currently because he is in a watermelon patch. He denies pain in his feet, but does have a pins and needles sensation.  He is followed by [facility] and was most recently seen on 04/09/2019. At that time, he reported symptoms concerning for claudication and ABIs were ordered. ABIs performed on 04/25/2019 by [facility] clinic showed 0.54 in his right and 0.79 in his left leg.   Back Pain This is a new problem. Episode onset: 1 week ago. The problem occurs intermittently. The problem has been waxing and waning since  onset. Pain location: mid back. Quality: dull to sharp pain. Radiates to: left leg. Exacerbated by: walking. Associated symptoms include leg pain. Pertinent negatives include no abdominal pain, chest pain or fever. Treatments tried: Tylenol. The treatment provided mild relief.    Allergies  Allergen Reactions  . Lipitor [Atorvastatin] Other (See Comments)    MYALGIA  . Zetia [Ezetimibe] Other (See Comments)    MYALGIA  . Lisinopril Cough  . Protonix [Pantoprazole] Other (See Comments)    headache  . Spironolactone Other (See Comments)    Breast tenderness     Current Outpatient Medications:  .  acetaminophen (TYLENOL) 500 MG tablet, Take 2 tablets (1,000 mg total) by mouth every 6 (six) hours. (Patient taking differently: Take 1,000 mg by mouth every 6 (six) hours as needed for moderate pain. ), Disp: 30 tablet, Rfl: 0 .  aspirin EC 81 MG tablet, Take 81 mg by mouth daily., Disp: , Rfl:  .  carvedilol (COREG) 6.25 MG tablet, Take 6.25 mg by mouth 2 (two) times daily with a meal., Disp: , Rfl: 3 .  diltiazem (CARDIZEM CD) 300 MG 24 hr capsule, Take 1 capsule (300 mg total) by mouth daily., Disp: 90 capsule, Rfl: 1 .  dorzolamide-timolol (COSOPT) 22.3-6.8 MG/ML ophthalmic solution, INSTILL ONE DROP INTO BOTH EYES TWICE DAILY, Disp: , Rfl:  .  gabapentin (NEURONTIN) 300 MG capsule, Take 2 capsules (600 mg total) by mouth 3 (three) times daily., Disp: 540 capsule, Rfl: 0 .  hydrochlorothiazide (  HYDRODIURIL) 25 MG tablet, Take 1 tablet (25 mg total) by mouth daily., Disp: 90 tablet, Rfl: 3 .  latanoprost (XALATAN) 0.005 % ophthalmic solution, Place 1 drop into both eyes at bedtime. , Disp: , Rfl: 3 .  Multiple Vitamin (MULTIVITAMIN WITH MINERALS) TABS tablet, Take 1 tablet by mouth daily., Disp: , Rfl:  .  OMEGA-3 FATTY ACIDS PO, Take 1,200 mg by mouth daily. , Disp: , Rfl:  .  omeprazole (PRILOSEC) 40 MG capsule, Take 1 capsule (40 mg total) by mouth daily., Disp: 90 capsule, Rfl: 0 .   rosuvastatin (CRESTOR) 10 MG tablet, Take 1 tablet (10 mg total) by mouth daily., Disp: 90 tablet, Rfl: 0 .  sucralfate (CARAFATE) 1 g tablet, Take 1 tablet (1 g total) by mouth 4 (four) times daily -  with meals and at bedtime. (Patient taking differently: Take 1 g by mouth 2 (two) times daily with a meal. ), Disp: 28 tablet, Rfl: 0 .  triamcinolone cream (KENALOG) 0.1 %, APPLY 1 APPLICATION TOPICALLY 2 (TWO) TIMES DAILY. TO EXTERNAL EAR, Disp: 30 g, Rfl: 0 .  valsartan (DIOVAN) 160 MG tablet, Take 1 tablet (160 mg total) by mouth daily., Disp: 180 tablet, Rfl: 3 .  albuterol (PROVENTIL HFA;VENTOLIN HFA) 108 (90 Base) MCG/ACT inhaler, TAKE 2 PUFFS BY MOUTH EVERY 6 HOURS AS NEEDED FOR WHEEZE OR SHORTNESS OF BREATH (Patient not taking: Reported on 05/02/2019), Disp: 6.7 Inhaler, Rfl: 0  Review of Systems  Constitutional: Negative for appetite change, chills and fever.  Respiratory: Negative for chest tightness, shortness of breath and wheezing.   Cardiovascular: Negative for chest pain and palpitations.  Gastrointestinal: Negative for abdominal pain, nausea and vomiting.  Musculoskeletal: Positive for back pain and myalgias (left leg).    Social History   Tobacco Use  . Smoking status: Former Smoker    Packs/day: 0.75    Years: 50.00    Pack years: 37.50    Types: Cigarettes    Quit date: 09/27/2017    Years since quitting: 1.5  . Smokeless tobacco: Former Systems developer    Types: Chew  Substance Use Topics  . Alcohol use: Not Currently    Comment: stopped drinking before cabg      Objective:   There were no vitals taken for this visit. There were no vitals filed for this visit.   Physical Exam   No results found for any visits on 05/02/19.     Assessment & Plan    1. Intermittent claudication ([place])  He reports his feet are not cool to the touch or discolored. Will refer urgently to vascular surgery and his symptoms are concerning for worsening claudication. Have instructed him on  return precautions and when to seek emergency care.  - Ambulatory referral to Vascular Surgery  2. S/P CABG x 5  - Ambulatory referral to Vascular Surgery  3. Hyperlipemia, mixed  - Ambulatory referral to Vascular Surgery  The entirety of the information documented in the History of Present Illness, Review of Systems and Physical Exam were personally obtained by me. Portions of this information were initially documented by Meyer Cory, CMA and reviewed by me for thoroughness and accuracy.   F/u PRN       Trinna Post, PA-C  [facility]

## 2019-05-02 NOTE — Telephone Encounter (Signed)
-----   Message from Trinna Post, [place] sent at 05/01/2019  8:13 AM EDT ----- Cholesterol is above goal but we have changed his medication to address that. His prediabetes is worse than last time and he needs to stop drinking mountain dews. Prostate labs look good. His blood counts are still low and I would like him to see a GI doctor for this. If he is agreeable, please place GI referral under dx iron deficiency anemia.

## 2019-05-02 NOTE — Telephone Encounter (Signed)
Pt has been having a dull and sharp pain on his left side since Mon. Has has taken Tylenol, but still having pain.  Please advise.  Thanks, [employer]

## 2019-05-02 NOTE — Patient Instructions (Signed)
Intermittent Claudication Intermittent claudication is pain in one or both legs that occurs when walking or exercising and goes away when resting. Intermittent claudication is a symptom of peripheral arterial disease (PAD). This condition is commonly treated with rest, medicine, and healthy lifestyle changes. If medical management does not improve symptoms, surgery can be done to restore blood flow (revascularization) to the affected leg. What are the causes?  This condition is caused by buildup of fatty material (plaque) within the major arteries in the body (atherosclerosis). Plaque makes arteries stiff and narrow, which prevents enough blood from reaching the leg muscles. Pain occurs when you walk or exercise because your muscles need (but cannot get) more blood when you are moving and exercising. What increases the risk? The following factors may make you more likely to develop this condition:  A family history of atherosclerosis.  A personal history of stroke or heart disease.  Older age.  Being inactive (sedentary lifestyle).  Being overweight.  Smoking cigarettes.  Having another health condition such as: ? Diabetes. ? High blood pressure. ? High cholesterol. What are the signs or symptoms? Symptoms of this condition may first develop in the lower leg, and then they may spread to the thigh, hip, buttock, or the back of the lower leg (calf) over time. Symptoms may include:  Aches or pains.  Cramps.  A feeling of tightness, weakness, or heaviness.  A wound on the lower leg or foot that heals poorly or does not heal. How is this diagnosed? This condition may be diagnosed based on:  Your symptoms.  Your medical history.  Tests, such as: ? Blood tests. ? Arterial duplex ultrasound. This test uses images of blood vessels and surrounding organs to evaluate blood flow within arteries. ? Angiogram. In this procedure, dye is injected into arteries and then X-rays are taken.  ? Magnetic resonance angiogram (MRA). In this procedure, strong magnets and radio waves are used instead of X-rays to create images of blood vessels and blood flow. ? CT angiogram (CTA). In this procedure, a large X-ray machine called a CT scanner takes detailed pictures of blood vessels that have been injected with dye. ? Ankle-brachial index (ABI) test. This procedure measures blood pressure in the leg during exercise and at rest. ? Exercise test. For this test, you will walk on a treadmill while tests are done (such as the ABI test) to evaluate how this condition affects your ability to walk or exercise. How is this treated? Treatment for this condition may involve treatment for the underlying cause, such as treatment for high blood pressure, high cholesterol, or diabetes. Treatment may include:  Lifestyle changes such as: ? Starting a supervised or home-based exercise program. ? Losing weight. ? Quitting smoking.  Medicines to help restore blood flow through your legs.  Blood vessel surgery (angioplasty) to restore blood flow around the blocked vessel. This is also known as endovascular therapy (EVT). This is only done if your intermittent claudication is caused by severe peripheral artery disease, a condition in which blood flow is severely or totally restricted by the narrowing of the arteries. Follow these instructions at home: Lifestyle   Maintain a healthy weight.  Eat a diet that is low in saturated fats and calories. Consider working with a diet and nutrition specialist (dietitian) to help you make healthy food choices.  Do not use any products that contain nicotine or tobacco, such as cigarettes and e-cigarettes. If you need help quitting, ask your health care provider.  If   your health care provider recommended an exercise program for you, follow it as directed. Your exercise program may involve: ? Walking 3 or more times a week. ? Walking until you have certain symptoms of  intermittent claudication. ? Resting until symptoms go away. ? Gradually increasing your walking time to about 50 minutes a day. General instructions  Work with your health care provider to manage any other health conditions you may have, including diabetes, high blood pressure, or high cholesterol.  Take over-the-counter and prescription medicines only as told by your health care provider.  Keep all follow-up visits as told by your health care provider. This is important. Contact a health care provider if:  Your pain does not go away with rest.  You have sores on your legs that do not heal or have a bad smell or pus coming from them.  Your condition gets worse or does not get better with treatment. Get help right away if:  You have chest pain.  You have difficulty breathing.  You develop arm weakness.  You have trouble speaking.  Your face begins to droop.  Your foot or leg is cold or it changes color.  Your foot or leg becomes numb. These symptoms may represent a serious problem that is an emergency. Do not wait to see if the symptoms will go away. Get medical help right away. Call your local emergency services (911 in the [place] Do not drive yourself to the hospital.  Summary  Intermittent claudication is pain in one or both legs that occurs when walking or exercising and goes away when resting.  This condition is caused by buildup of fatty material (plaque) within the major arteries in the body (atherosclerosis). Plaque makes arteries stiff and narrow, which prevents enough blood from reaching the leg muscles.  Intermittent claudication can be treated with medicine and lifestyle changes. If medical treatment fails, surgery can be done to help return blood flow to the affected area.  Make sure you work with your health care provider to manage any other health conditions you may have, including diabetes, high blood pressure, or high cholesterol. This information is not  intended to replace advice given to you by your health care provider. Make sure you discuss any questions you have with your health care provider. Document Released: 07/30/2004 Document Revised: 09/09/2017 Document Reviewed: 10/28/2016 Elsevier Patient Education  2020 Elsevier Inc.  

## 2019-05-02 NOTE — Telephone Encounter (Signed)
Patient advised as directed below. He does agree to the GI referral.

## 2019-05-09 DIAGNOSIS — I739 Peripheral vascular disease, unspecified: Secondary | ICD-10-CM | POA: Insufficient documentation

## 2019-05-10 ENCOUNTER — Other Ambulatory Visit: Payer: Self-pay

## 2019-05-10 ENCOUNTER — Inpatient Hospital Stay: Payer: Medicare Other | Attending: Nurse Practitioner | Admitting: Nurse Practitioner

## 2019-05-10 ENCOUNTER — Ambulatory Visit
Admission: RE | Admit: 2019-05-10 | Discharge: 2019-05-10 | Disposition: A | Discharge status: Home / Self Care | Payer: Medicare Other | Source: Ambulatory Visit | Attending: Oncology | Admitting: Oncology

## 2019-05-10 DIAGNOSIS — Z87891 Personal history of nicotine dependence: Secondary | ICD-10-CM

## 2019-05-10 DIAGNOSIS — Z122 Encounter for screening for malignant neoplasm of respiratory organs: Secondary | ICD-10-CM | POA: Diagnosis not present

## 2019-05-10 NOTE — Progress Notes (Signed)
Virtual Visit via Video Enabled Telemedicine Note   I connected with Craig [name] on 05/10/19 at 1:15 PM EST by video enabled telemedicine visit and verified that I am speaking with the correct person using two identifiers.   I discussed the limitations, risks, security and privacy concerns of performing an evaluation and management service by telemedicine and the availability of in-person appointments. I also discussed with the patient that there may be a patient responsible charge related to this service. The patient expressed understanding and agreed to proceed.   Other persons participating in the visit and their role in the encounter: Burgess Estelle, RN- checking in patient & navigation  Patient's location: clinic  Provider's location: home  Chief Complaint: Low Dose CT Screening  Patient agreed to evaluation by telemedicine to discuss shared decision making for consideration of low dose CT lung cancer screening.    In accordance with CMS guidelines, patient has met eligibility criteria including age, absence of signs or symptoms of lung cancer.  Social History   Tobacco Use  . Smoking status: Former Smoker    Packs/day: 0.75    Years: 50.00    Pack years: 37.50    Types: Cigarettes    Quit date: 09/27/2017    Years since quitting: 1.6  . Smokeless tobacco: Former Systems developer    Types: Chew  Substance Use Topics  . Alcohol use: Not Currently    Comment: stopped drinking before cabg     A shared decision-making session was conducted prior to the performance of CT scan. This includes one or more decision aids, includes benefits and harms of screening, follow-up diagnostic testing, over-diagnosis, false positive rate, and total radiation exposure.   Counseling on the importance of adherence to annual lung cancer LDCT screening, impact of co-morbidities, and ability or willingness to undergo diagnosis and treatment is imperative for compliance of the program.   Counseling on the  importance of continued smoking cessation for former smokers; the importance of smoking cessation for current smokers, and information about tobacco cessation interventions have been given to patient including [name] and [name] programs.   Written order for lung cancer screening with LDCT has been given to the patient and any and all questions have been answered to the best of my abilities.    Yearly follow up will be coordinated by Burgess Estelle, Thoracic Navigator.  I discussed the assessment and treatment plan with the patient. The patient was provided an opportunity to ask questions and all were answered. The patient agreed with the plan and demonstrated an understanding of the instructions.   The patient was advised to call back or seek an in-person evaluation if the symptoms worsen or if the condition fails to improve as anticipated.   I provided 15 minutes of face-to-face video visit time during this encounter, and > 50% was spent counseling as documented under my assessment & plan.   Beckey Rutter, DNP, AGNP-C [place] at [facility] [phone] (work cell) [phone] (office)

## 2019-05-12 DIAGNOSIS — C259 Malignant neoplasm of pancreas, unspecified: Secondary | ICD-10-CM

## 2019-05-12 DIAGNOSIS — C787 Secondary malignant neoplasm of liver and intrahepatic bile duct: Secondary | ICD-10-CM

## 2019-05-12 HISTORY — DX: Malignant neoplasm of pancreas, unspecified: C78.7

## 2019-05-12 HISTORY — DX: Malignant neoplasm of pancreas, unspecified: C25.9

## 2019-05-14 ENCOUNTER — Telehealth: Payer: Self-pay | Admitting: Physician Assistant

## 2019-05-14 NOTE — Telephone Encounter (Signed)
Patient states pain is in upper abdomen. Pain comes and goes. Has some pain in his sides around his rib area (but mostly when he is lying down or sitting in recliner). Patient states pain has been occurring for about 1 week. No chest pain, no sob, no diarrhea, and no urinary symptoms.

## 2019-05-14 NOTE — Telephone Encounter (Signed)
Per Fabio Bering, pt was schedule for ov in the morning. Advised pt to go to ER if symptoms worsen. Patient expressed understanding.

## 2019-05-14 NOTE — Telephone Encounter (Signed)
Can we please triage the patient? He may need to go to the ER based on his symptoms.

## 2019-05-14 NOTE — Telephone Encounter (Signed)
Please advise 

## 2019-05-14 NOTE — Telephone Encounter (Signed)
Pt has had severe abdominal pain for about a week.  Not getting better. Wants to be seen.  Please call pt back for an appt if he can be allowed in at [phone].  Thanks, [employer]

## 2019-05-15 ENCOUNTER — Ambulatory Visit: Payer: Medicare Other

## 2019-05-15 ENCOUNTER — Ambulatory Visit
Admission: RE | Admit: 2019-05-15 | Discharge: 2019-05-15 | Disposition: A | Discharge status: Home / Self Care | Payer: Medicare Other | Source: Ambulatory Visit | Attending: Physician Assistant | Admitting: Physician Assistant

## 2019-05-15 ENCOUNTER — Other Ambulatory Visit: Payer: Self-pay

## 2019-05-15 ENCOUNTER — Other Ambulatory Visit: Payer: Self-pay | Admitting: Physician Assistant

## 2019-05-15 ENCOUNTER — Ambulatory Visit (INDEPENDENT_AMBULATORY_CARE_PROVIDER_SITE_OTHER): Payer: Medicare Other | Admitting: Physician Assistant

## 2019-05-15 ENCOUNTER — Encounter: Payer: Self-pay | Admitting: *Deleted

## 2019-05-15 ENCOUNTER — Encounter: Payer: Self-pay | Admitting: Physician Assistant

## 2019-05-15 VITALS — BP 120/67 | HR 70 | Temp 98.4°F | Resp 16 | Wt 195.4 lb

## 2019-05-15 DIAGNOSIS — K769 Liver disease, unspecified: Secondary | ICD-10-CM | POA: Diagnosis not present

## 2019-05-15 DIAGNOSIS — F1011 Alcohol abuse, in remission: Secondary | ICD-10-CM

## 2019-05-15 DIAGNOSIS — R1011 Right upper quadrant pain: Secondary | ICD-10-CM | POA: Diagnosis not present

## 2019-05-15 DIAGNOSIS — R195 Other fecal abnormalities: Secondary | ICD-10-CM | POA: Diagnosis not present

## 2019-05-15 DIAGNOSIS — R109 Unspecified abdominal pain: Secondary | ICD-10-CM | POA: Insufficient documentation

## 2019-05-15 LAB — POCT URINALYSIS DIPSTICK
Bilirubin, UA: NEGATIVE
Blood, UA: NEGATIVE
Glucose, UA: NEGATIVE
Ketones, UA: NEGATIVE
Leukocytes, UA: NEGATIVE
Nitrite, UA: NEGATIVE
Protein, UA: NEGATIVE
Spec Grav, UA: 1.01 (ref 1.010–1.025)
Urobilinogen, UA: 0.2 E.U./dL
pH, UA: 6 (ref 5.0–8.0)

## 2019-05-15 LAB — POC HEMOCCULT BLD/STL (OFFICE/1-CARD/DIAGNOSTIC): Fecal Occult Blood, POC: NEGATIVE

## 2019-05-15 MED ORDER — SUCRALFATE 1 G PO TABS: 1.0000 g | ORAL_TABLET | Freq: Three times a day (TID) | ORAL | 0 refills | Status: DC

## 2019-05-15 NOTE — Progress Notes (Signed)
Patient: Craig [name] Male    DOB: 06-Dec-1945   73 y.o.   MRN: [id number] Visit Date: 05/15/2019  Today's Provider: Trinna Post, PA-C   Chief Complaint  Patient presents with  . Abdominal Pain   Subjective:     HPI Patient with a history of CABG x 5, PVD, HTN, history of tobacco abuse, iron deficiency anemia, cholecystectomy, history of alcohol abuse here today c/o abdominal pain radiating to his back x's 10 days. Patient denies nausea,vomiting or diarrhea. Patient denies pain after eating. Patient reports stools are black - he is taking his iron supplements for iron deficiency anemia. Reports pain is centered in epigastric region and radiates to his sides. It is constant and does not get better. Currently pain is 4/10 and can go up to 8/10. Tylenol is helpful. He has not been eating recently due to lack of appetite. He had a RUQ ultrasound in 07/2018 for abdominal pain which revealed gallstones and subsequently had a cholecystectomy in 11/2018. He denies issues urinating. Denies heartburn. Denies chest pain. He reports when he was in his 20's he was an alcoholic. Reports he drank every day, all he could get. Then, after that he reports he reduced his drinking to 20 beers a night on Friday, Saturday, and Sunday. He reports he has not had a drink in 3 years. Takes up to 3000 mg tylenol daily.   Wt Readings from Last 3 Encounters:  05/15/19 195 lb 6.4 oz (88.6 kg)  05/10/19 188 lb (85.3 kg)  04/27/19 200 lb 3.2 oz (90.8 kg)     Allergies  Allergen Reactions  . Lipitor [Atorvastatin] Other (See Comments)    MYALGIA  . Zetia [Ezetimibe] Other (See Comments)    MYALGIA  . Lisinopril Cough  . Protonix [Pantoprazole] Other (See Comments)    headache  . Spironolactone Other (See Comments)    Breast tenderness     Current Outpatient Medications:  .  acetaminophen (TYLENOL) 500 MG tablet, Take 2 tablets (1,000 mg total) by mouth every 6 (six) hours. (Patient taking  differently: Take 1,000 mg by mouth every 6 (six) hours as needed for moderate pain. ), Disp: 30 tablet, Rfl: 0 .  aspirin EC 81 MG tablet, Take 81 mg by mouth daily., Disp: , Rfl:  .  carvedilol (COREG) 6.25 MG tablet, Take 6.25 mg by mouth 2 (two) times daily with a meal., Disp: , Rfl: 3 .  diltiazem (CARDIZEM CD) 300 MG 24 hr capsule, Take 1 capsule (300 mg total) by mouth daily., Disp: 90 capsule, Rfl: 1 .  dorzolamide-timolol (COSOPT) 22.3-6.8 MG/ML ophthalmic solution, INSTILL ONE DROP INTO BOTH EYES TWICE DAILY, Disp: , Rfl:  .  ferrous sulfate 325 (65 FE) MG tablet, Take 325 mg by mouth daily with breakfast., Disp: , Rfl:  .  gabapentin (NEURONTIN) 300 MG capsule, Take 2 capsules (600 mg total) by mouth 3 (three) times daily., Disp: 540 capsule, Rfl: 0 .  hydrochlorothiazide (HYDRODIURIL) 25 MG tablet, Take 1 tablet (25 mg total) by mouth daily., Disp: 90 tablet, Rfl: 3 .  latanoprost (XALATAN) 0.005 % ophthalmic solution, Place 1 drop into both eyes at bedtime. , Disp: , Rfl: 3 .  Multiple Vitamin (MULTIVITAMIN WITH MINERALS) TABS tablet, Take 1 tablet by mouth daily., Disp: , Rfl:  .  OMEGA-3 FATTY ACIDS PO, Take 1,200 mg by mouth daily. , Disp: , Rfl:  .  omeprazole (PRILOSEC) 40 MG capsule, Take 1 capsule (40 mg total)  by mouth daily., Disp: 90 capsule, Rfl: 0 .  rosuvastatin (CRESTOR) 10 MG tablet, Take 1 tablet (10 mg total) by mouth daily., Disp: 90 tablet, Rfl: 0 .  sucralfate (CARAFATE) 1 g tablet, Take 1 tablet (1 g total) by mouth 4 (four) times daily -  with meals and at bedtime., Disp: 28 tablet, Rfl: 0 .  triamcinolone cream (KENALOG) 0.1 %, APPLY 1 APPLICATION TOPICALLY 2 (TWO) TIMES DAILY. TO EXTERNAL EAR, Disp: 30 g, Rfl: 0 .  valsartan (DIOVAN) 160 MG tablet, Take 1 tablet (160 mg total) by mouth daily., Disp: 180 tablet, Rfl: 3  Review of Systems  Gastrointestinal: Positive for abdominal distention, abdominal pain and blood in stool. Negative for constipation, diarrhea,  nausea and vomiting.    Social History   Tobacco Use  . Smoking status: Former Smoker    Packs/day: 0.75    Years: 50.00    Pack years: 37.50    Types: Cigarettes    Quit date: 09/27/2017    Years since quitting: 1.6  . Smokeless tobacco: Former Systems developer    Types: Chew  Substance Use Topics  . Alcohol use: Not Currently    Comment: stopped drinking before cabg      Objective:   BP 120/67 (BP Location: Left Arm, Patient Position: Sitting, Cuff Size: Large)   Pulse 70   Temp 98.4 F (36.9 C) (Oral)   Resp 16   Wt 195 lb 6.4 oz (88.6 kg)   BMI 28.86 kg/m  Vitals:   05/15/19 0957  BP: 120/67  Pulse: 70  Resp: 16  Temp: 98.4 F (36.9 C)  TempSrc: Oral  Weight: 195 lb 6.4 oz (88.6 kg)     Physical Exam Constitutional:      Appearance: He is well-developed. He is obese.  Cardiovascular:     Rate and Rhythm: Normal rate and regular rhythm.  Pulmonary:     Effort: Pulmonary effort is normal.     Breath sounds: Normal breath sounds.  Abdominal:     General: Bowel sounds are normal.     Palpations: Abdomen is soft. There is no hepatomegaly.     Tenderness: There is abdominal tenderness in the right upper quadrant and epigastric area. There is no right CVA tenderness or left CVA tenderness.  Skin:    General: Skin is warm and dry.  Neurological:     Mental Status: He is alert.  Psychiatric:        Mood and Affect: Mood normal.        Behavior: Behavior normal.      Results for orders placed or performed in visit on 05/15/19  POCT urinalysis dipstick  Result Value Ref Range   Color, UA yellow    Clarity, UA clear    Glucose, UA Negative Negative   Bilirubin, UA Negative    Ketones, UA Negative    Spec Grav, UA 1.010 1.010 - 1.025   Blood, UA Negative    pH, UA 6.0 5.0 - 8.0   Protein, UA Negative Negative   Urobilinogen, UA 0.2 0.2 or 1.0 E.U./dL   Nitrite, UA Negative    Leukocytes, UA Negative Negative  POC Hemoccult Bld/Stl (1-Cd Office Dx)  Result  Value Ref Range   Card #1 Date     Fecal Occult Blood, POC Negative Negative       Assessment & Plan    1. Right upper quadrant abdominal pain  Hemoccult negative in office and urinalysis clear. Patient is tender in RUQ  and with acute onset and severity of symptoms, concerned about liver pathology. Could also be gastritis, GERD, MSK, PUD, constipation. Will evaluate as below.   Complete ultrasound shows multiple hypoechoic lesions ranging from 8 mm to 20 mm on his liver that are new since 07/2018 ultrasound. Concerning for metastatic disease. Patient with history of heavy alcohol abuse. Radiologist recommends CT abdomen pelvis but I think it will be more stream lined to refer patient to oncology and have them decide which imaging he should pursue.   - Comprehensive Metabolic Panel (CMET) - CBC with Differential - Lipase - Amylase - Hepatitis c antibody (reflex) - Hepatitis B Surface AntiBODY - Hepatitis B Surface AntiGEN - Hepatitis A Ab, Total - Hepatitis B Core Antibody, total - DG Abd 1 View; Future - US Abdomen Complete; Future - sucralfate (CARAFATE) 1 g tablet; Take 1 tablet (1 g total) by mouth 4 (four) times daily -  with meals and at bedtime.  Dispense: 28 tablet; Refill: 0 - POCT urinalysis dipstick -hemoccult  2. History of alcohol abuse  - Comprehensive Metabolic Panel (CMET) - CBC with Differential - Lipase - Amylase - Hepatitis c antibody (reflex) - Hepatitis B Surface AntiBODY - Hepatitis B Surface AntiGEN - Hepatitis A Ab, Total  3. Dark stools  The entirety of the information documented in the History of Present Illness, Review of Systems and Physical Exam were personally obtained by me. Portions of this information were initially documented by Lynford Humphrey, CMA and reviewed by me for thoroughness and accuracy.   F/u PRN      Trinna Post, PA-C  [facility]

## 2019-05-15 NOTE — Patient Instructions (Signed)
Abdominal Pain, Adult    Many things can cause belly (abdominal) pain. Most times, belly pain is not dangerous. Many cases of belly pain can be watched and treated at home. Sometimes belly pain is serious, though. Your doctor will try to find the cause of your belly pain.  Follow these instructions at home:  · Take over-the-counter and prescription medicines only as told by your doctor. Do not take medicines that help you poop (laxatives) unless told to by your doctor.  · Drink enough fluid to keep your pee (urine) clear or pale yellow.  · Watch your belly pain for any changes.  · Keep all follow-up visits as told by your doctor. This is important.  Contact a doctor if:  · Your belly pain changes or gets worse.  · You are not hungry, or you lose weight without trying.  · You are having trouble pooping (constipated) or have watery poop (diarrhea) for more than 2-3 days.  · You have pain when you pee or poop.  · Your belly pain wakes you up at night.  · Your pain gets worse with meals, after eating, or with certain foods.  · You are throwing up and cannot keep anything down.  · You have a fever.  Get help right away if:  · Your pain does not go away as soon as your doctor says it should.  · You cannot stop throwing up.  · Your pain is only in areas of your belly, such as the right side or the left lower part of the belly.  · You have bloody or black poop, or poop that looks like tar.  · You have very bad pain, cramping, or bloating in your belly.  · You have signs of not having enough fluid or water in your body (dehydration), such as:  ? Dark pee, very little pee, or no pee.  ? Cracked lips.  ? Dry mouth.  ? Sunken eyes.  ? Sleepiness.  ? Weakness.  This information is not intended to replace advice given to you by your health care provider. Make sure you discuss any questions you have with your health care provider.  Document Released: 03/15/2008 Document Revised: 04/16/2016 Document Reviewed: 03/10/2016  [employer]vier  Interactive Patient Education © 2020 [employer]

## 2019-05-16 ENCOUNTER — Ambulatory Visit
Admission: RE | Admit: 2019-05-16 | Discharge: 2019-05-16 | Disposition: A | Discharge status: Home / Self Care | Payer: Medicare Other | Source: Ambulatory Visit | Attending: Physician Assistant | Admitting: Physician Assistant

## 2019-05-16 ENCOUNTER — Ambulatory Visit: Payer: Self-pay | Admitting: Physician Assistant

## 2019-05-16 ENCOUNTER — Other Ambulatory Visit: Payer: Self-pay | Admitting: Physician Assistant

## 2019-05-16 ENCOUNTER — Other Ambulatory Visit: Payer: Self-pay

## 2019-05-16 DIAGNOSIS — R748 Abnormal levels of other serum enzymes: Secondary | ICD-10-CM | POA: Insufficient documentation

## 2019-05-16 DIAGNOSIS — K8689 Other specified diseases of pancreas: Secondary | ICD-10-CM

## 2019-05-16 DIAGNOSIS — K769 Liver disease, unspecified: Secondary | ICD-10-CM | POA: Insufficient documentation

## 2019-05-16 LAB — CBC WITH DIFFERENTIAL/PLATELET
Basophils Absolute: 0.1 10*3/uL (ref 0.0–0.2)
Basos: 1 %
EOS (ABSOLUTE): 0.2 10*3/uL (ref 0.0–0.4)
Eos: 3 %
Hematocrit: 32.7 % — ABNORMAL LOW (ref 37.5–51.0)
Hemoglobin: 11.2 g/dL — ABNORMAL LOW (ref 13.0–17.7)
Immature Grans (Abs): 0 10*3/uL (ref 0.0–0.1)
Immature Granulocytes: 0 %
Lymphocytes Absolute: 1.2 10*3/uL (ref 0.7–3.1)
Lymphs: 23 %
MCH: 27.4 pg (ref 26.6–33.0)
MCHC: 34.3 g/dL (ref 31.5–35.7)
MCV: 80 fL (ref 79–97)
Monocytes Absolute: 0.5 10*3/uL (ref 0.1–0.9)
Monocytes: 9 %
Neutrophils Absolute: 3.5 10*3/uL (ref 1.4–7.0)
Neutrophils: 64 %
Platelets: 277 10*3/uL (ref 150–450)
RBC: 4.09 x10E6/uL — ABNORMAL LOW (ref 4.14–5.80)
RDW: 14.7 % (ref 11.6–15.4)
WBC: 5.5 10*3/uL (ref 3.4–10.8)

## 2019-05-16 LAB — COMPREHENSIVE METABOLIC PANEL
ALT: 18 IU/L (ref 0–44)
AST: 21 IU/L (ref 0–40)
Albumin/Globulin Ratio: 1.7 (ref 1.2–2.2)
Albumin: 4.3 g/dL (ref 3.7–4.7)
Alkaline Phosphatase: 71 IU/L (ref 39–117)
BUN/Creatinine Ratio: 18 (ref 10–24)
BUN: 23 mg/dL (ref 8–27)
Bilirubin Total: <0.2 mg/dL (ref 0.0–1.2)
CO2: 19 mmol/L — ABNORMAL LOW (ref 20–29)
Calcium: 9.9 mg/dL (ref 8.6–10.2)
Chloride: 103 mmol/L (ref 96–106)
Creatinine, Ser: 1.27 mg/dL (ref 0.76–1.27)
GFR calc Af Amer: 65 mL/min/{1.73_m2} (ref >59)
GFR calc non Af Amer: 56 mL/min/{1.73_m2} — ABNORMAL LOW (ref >59)
Globulin, Total: 2.5 g/dL (ref 1.5–4.5)
Glucose: 111 mg/dL — ABNORMAL HIGH (ref 65–99)
Potassium: 4.7 mmol/L (ref 3.5–5.2)
Sodium: 139 mmol/L (ref 134–144)
Total Protein: 6.8 g/dL (ref 6.0–8.5)

## 2019-05-16 LAB — HEPATITIS B CORE ANTIBODY, TOTAL: Hep B Core Total Ab: NEGATIVE

## 2019-05-16 LAB — HEPATITIS A ANTIBODY, TOTAL: hep A Total Ab: NEGATIVE

## 2019-05-16 LAB — HEPATITIS C ANTIBODY (REFLEX): HCV Ab: <0.1 s/co ratio (ref 0.0–0.9)

## 2019-05-16 LAB — HEPATITIS B SURFACE ANTIBODY,QUALITATIVE: Hep B Surface Ab, Qual: NONREACTIVE

## 2019-05-16 LAB — HCV COMMENT:

## 2019-05-16 LAB — AMYLASE: Amylase: 161 U/L — ABNORMAL HIGH (ref 31–110)

## 2019-05-16 LAB — LIPASE: Lipase: 485 U/L — ABNORMAL HIGH (ref 13–78)

## 2019-05-16 LAB — HEPATITIS B SURFACE ANTIGEN: Hepatitis B Surface Ag: NEGATIVE

## 2019-05-16 MED ORDER — IOHEXOL 300 MG/ML  SOLN
100.0000 mL | Freq: Once | INTRAMUSCULAR | Status: AC | PRN
  Administered 2019-05-16: 100 mL via INTRAVENOUS

## 2019-05-16 NOTE — Progress Notes (Signed)
Advised patient of CT results.

## 2019-05-17 ENCOUNTER — Telehealth: Payer: Self-pay

## 2019-05-17 ENCOUNTER — Encounter: Payer: Self-pay | Admitting: Oncology

## 2019-05-17 ENCOUNTER — Other Ambulatory Visit: Payer: Self-pay

## 2019-05-17 ENCOUNTER — Other Ambulatory Visit: Payer: Self-pay | Admitting: Physician Assistant

## 2019-05-17 ENCOUNTER — Inpatient Hospital Stay: Payer: Medicare Other | Admitting: *Deleted

## 2019-05-17 ENCOUNTER — Ambulatory Visit (INDEPENDENT_AMBULATORY_CARE_PROVIDER_SITE_OTHER): Payer: Medicare Other | Admitting: Vascular Surgery

## 2019-05-17 ENCOUNTER — Encounter (INDEPENDENT_AMBULATORY_CARE_PROVIDER_SITE_OTHER): Payer: Self-pay | Admitting: Vascular Surgery

## 2019-05-17 ENCOUNTER — Inpatient Hospital Stay: Payer: Medicare Other | Attending: Oncology | Admitting: Oncology

## 2019-05-17 VITALS — BP 163/74 | HR 84 | Resp 16 | Ht 69.0 in | Wt 196.0 lb

## 2019-05-17 VITALS — BP 129/67 | HR 76 | Temp 96.4°F | Ht 69.0 in | Wt 195.6 lb

## 2019-05-17 DIAGNOSIS — Z888 Allergy status to other drugs, medicaments and biological substances status: Secondary | ICD-10-CM | POA: Insufficient documentation

## 2019-05-17 DIAGNOSIS — K769 Liver disease, unspecified: Secondary | ICD-10-CM | POA: Insufficient documentation

## 2019-05-17 DIAGNOSIS — I110 Hypertensive heart disease with heart failure: Secondary | ICD-10-CM | POA: Insufficient documentation

## 2019-05-17 DIAGNOSIS — F1010 Alcohol abuse, uncomplicated: Secondary | ICD-10-CM

## 2019-05-17 DIAGNOSIS — Z809 Family history of malignant neoplasm, unspecified: Secondary | ICD-10-CM | POA: Insufficient documentation

## 2019-05-17 DIAGNOSIS — I70213 Atherosclerosis of native arteries of extremities with intermittent claudication, bilateral legs: Secondary | ICD-10-CM

## 2019-05-17 DIAGNOSIS — Z5111 Encounter for antineoplastic chemotherapy: Secondary | ICD-10-CM | POA: Insufficient documentation

## 2019-05-17 DIAGNOSIS — Z9049 Acquired absence of other specified parts of digestive tract: Secondary | ICD-10-CM | POA: Insufficient documentation

## 2019-05-17 DIAGNOSIS — I2510 Atherosclerotic heart disease of native coronary artery without angina pectoris: Secondary | ICD-10-CM | POA: Insufficient documentation

## 2019-05-17 DIAGNOSIS — R54 Age-related physical debility: Secondary | ICD-10-CM | POA: Insufficient documentation

## 2019-05-17 DIAGNOSIS — R5383 Other fatigue: Secondary | ICD-10-CM

## 2019-05-17 DIAGNOSIS — K8689 Other specified diseases of pancreas: Secondary | ICD-10-CM

## 2019-05-17 DIAGNOSIS — I739 Peripheral vascular disease, unspecified: Secondary | ICD-10-CM | POA: Insufficient documentation

## 2019-05-17 DIAGNOSIS — I10 Essential (primary) hypertension: Secondary | ICD-10-CM

## 2019-05-17 DIAGNOSIS — N400 Enlarged prostate without lower urinary tract symptoms: Secondary | ICD-10-CM | POA: Diagnosis not present

## 2019-05-17 DIAGNOSIS — R160 Hepatomegaly, not elsewhere classified: Secondary | ICD-10-CM | POA: Diagnosis not present

## 2019-05-17 DIAGNOSIS — J948 Other specified pleural conditions: Secondary | ICD-10-CM | POA: Diagnosis not present

## 2019-05-17 DIAGNOSIS — K219 Gastro-esophageal reflux disease without esophagitis: Secondary | ICD-10-CM

## 2019-05-17 DIAGNOSIS — D509 Iron deficiency anemia, unspecified: Secondary | ICD-10-CM | POA: Diagnosis not present

## 2019-05-17 DIAGNOSIS — Z951 Presence of aortocoronary bypass graft: Secondary | ICD-10-CM | POA: Insufficient documentation

## 2019-05-17 DIAGNOSIS — Z79899 Other long term (current) drug therapy: Secondary | ICD-10-CM | POA: Diagnosis not present

## 2019-05-17 DIAGNOSIS — R0989 Other specified symptoms and signs involving the circulatory and respiratory systems: Secondary | ICD-10-CM | POA: Diagnosis not present

## 2019-05-17 DIAGNOSIS — R1011 Right upper quadrant pain: Secondary | ICD-10-CM | POA: Insufficient documentation

## 2019-05-17 DIAGNOSIS — R599 Enlarged lymph nodes, unspecified: Secondary | ICD-10-CM | POA: Insufficient documentation

## 2019-05-17 DIAGNOSIS — Z87891 Personal history of nicotine dependence: Secondary | ICD-10-CM | POA: Insufficient documentation

## 2019-05-17 DIAGNOSIS — C259 Malignant neoplasm of pancreas, unspecified: Secondary | ICD-10-CM | POA: Insufficient documentation

## 2019-05-17 DIAGNOSIS — I700 Atherosclerosis of aorta: Secondary | ICD-10-CM | POA: Insufficient documentation

## 2019-05-17 DIAGNOSIS — J439 Emphysema, unspecified: Secondary | ICD-10-CM

## 2019-05-17 DIAGNOSIS — K869 Disease of pancreas, unspecified: Secondary | ICD-10-CM | POA: Diagnosis not present

## 2019-05-17 DIAGNOSIS — Z808 Family history of malignant neoplasm of other organs or systems: Secondary | ICD-10-CM | POA: Insufficient documentation

## 2019-05-17 DIAGNOSIS — E785 Hyperlipidemia, unspecified: Secondary | ICD-10-CM | POA: Insufficient documentation

## 2019-05-17 DIAGNOSIS — Z825 Family history of asthma and other chronic lower respiratory diseases: Secondary | ICD-10-CM | POA: Insufficient documentation

## 2019-05-17 DIAGNOSIS — C787 Secondary malignant neoplasm of liver and intrahepatic bile duct: Secondary | ICD-10-CM | POA: Insufficient documentation

## 2019-05-17 DIAGNOSIS — N281 Cyst of kidney, acquired: Secondary | ICD-10-CM | POA: Diagnosis not present

## 2019-05-17 DIAGNOSIS — R918 Other nonspecific abnormal finding of lung field: Secondary | ICD-10-CM | POA: Diagnosis not present

## 2019-05-17 DIAGNOSIS — Z880 Allergy status to penicillin: Secondary | ICD-10-CM

## 2019-05-17 DIAGNOSIS — I25118 Atherosclerotic heart disease of native coronary artery with other forms of angina pectoris: Secondary | ICD-10-CM

## 2019-05-17 DIAGNOSIS — E782 Mixed hyperlipidemia: Secondary | ICD-10-CM

## 2019-05-17 LAB — COMPREHENSIVE METABOLIC PANEL
ALT: 30 U/L (ref 0–44)
AST: 31 U/L (ref 15–41)
Albumin: 4.2 g/dL (ref 3.5–5.0)
Alkaline Phosphatase: 67 U/L (ref 38–126)
Anion gap: 10 (ref 5–15)
BUN: 23 mg/dL (ref 8–23)
CO2: 24 mmol/L (ref 22–32)
Calcium: 9.4 mg/dL (ref 8.9–10.3)
Chloride: 106 mmol/L (ref 98–111)
Creatinine, Ser: 1.36 mg/dL — ABNORMAL HIGH (ref 0.61–1.24)
GFR calc Af Amer: 60 mL/min — ABNORMAL LOW (ref >60)
GFR calc non Af Amer: 52 mL/min — ABNORMAL LOW (ref >60)
Glucose, Bld: 131 mg/dL — ABNORMAL HIGH (ref 70–99)
Potassium: 4.4 mmol/L (ref 3.5–5.1)
Sodium: 140 mmol/L (ref 135–145)
Total Bilirubin: 0.5 mg/dL (ref 0.3–1.2)
Total Protein: 8.2 g/dL — ABNORMAL HIGH (ref 6.5–8.1)

## 2019-05-17 LAB — CBC WITH DIFFERENTIAL/PLATELET
Abs Immature Granulocytes: 0.01 10*3/uL (ref 0.00–0.07)
Basophils Absolute: 0 10*3/uL (ref 0.0–0.1)
Basophils Relative: 1 %
Eosinophils Absolute: 0.1 10*3/uL (ref 0.0–0.5)
Eosinophils Relative: 3 %
HCT: 34.8 % — ABNORMAL LOW (ref 39.0–52.0)
Hemoglobin: 11 g/dL — ABNORMAL LOW (ref 13.0–17.0)
Immature Granulocytes: 0 %
Lymphocytes Relative: 22 %
Lymphs Abs: 1.2 10*3/uL (ref 0.7–4.0)
MCH: 27 pg (ref 26.0–34.0)
MCHC: 31.6 g/dL (ref 30.0–36.0)
MCV: 85.3 fL (ref 80.0–100.0)
Monocytes Absolute: 0.4 10*3/uL (ref 0.1–1.0)
Monocytes Relative: 8 %
Neutro Abs: 3.7 10*3/uL (ref 1.7–7.7)
Neutrophils Relative %: 66 %
Platelets: 288 10*3/uL (ref 150–400)
RBC: 4.08 MIL/uL — ABNORMAL LOW (ref 4.22–5.81)
RDW: 15.5 % (ref 11.5–15.5)
WBC: 5.5 10*3/uL (ref 4.0–10.5)
nRBC: 0 % (ref 0.0–0.2)

## 2019-05-17 LAB — LIPASE, BLOOD: Lipase: 231 U/L — ABNORMAL HIGH (ref 11–51)

## 2019-05-17 LAB — PROTIME-INR
INR: 1 (ref 0.8–1.2)
Prothrombin Time: 12.9 seconds (ref 11.4–15.2)

## 2019-05-17 LAB — APTT: aPTT: 29 seconds (ref 24–36)

## 2019-05-17 NOTE — Progress Notes (Signed)
Met with Craig [name] before, during, and after consult with Dr. Tasia Catchings. Introduced nurse navigator services and provided contact information for future needs, He states he will likely get his wife to call and gave me permission to speak with her as she will have many questions. Invasive checklist faxed to special scheduling to arrange liver biopsy. Received confirmation of receipt. Reports only takes aspirin 3m daily as blood thinner. Educated further on liver biopsy procedure. We will contact him once the biopsy is scheduled. Follow up with Dr. YTasia Catchingsfor biopsy results. Escorted to the lab.

## 2019-05-17 NOTE — Telephone Encounter (Signed)
Called and notified Craig [name] with biopsy appointment details for 05/21/19, Monday at 0830. [facility] [place] [facility] [place] [facility]. He will need a driver and should not eat or drink after midnight. Read back performed.

## 2019-05-17 NOTE — Progress Notes (Signed)
MRN : [id number]  Craig [name] is a 73 y.o. (Jan 30, 1946) male who presents with chief complaint of No chief complaint on file. [name] Kitchen  History of Present Illness:    The patient is seen for evaluation of painful lower extremities and diminished pulses. Patient notes the pain is always associated with activity and is very consistent day today. Typically, the pain occurs at less than one block, progress is as activity continues to the point that the patient must stop walking. Resting including standing still for several minutes allowed resumption of the activity and the ability to walk a similar distance before stopping again. Uneven terrain and inclined shorten the distance. The pain has been progressive over the past several years. The patient states the inability to walk is now having a profound negative impact on quality of life and daily activities.  The patient denies rest pain or dangling of an extremity off the side of the bed during the night for relief. No open wounds or sores at this time. No prior interventions or surgeries.  No history of back problems or DJD of the lumbar sacral spine.   The patient denies changes in claudication symptoms or new rest pain symptoms.  No new ulcers or wounds of the foot.  The patient's blood pressure has been stable and relatively well controlled. The patient denies amaurosis fugax or recent TIA symptoms. There are no recent neurological changes noted. The patient denies history of DVT, PE or superficial thrombophlebitis. The patient denies recent episodes of angina or shortness of breath.   ABI's a from two years ago were abnormal  No outpatient medications have been marked as taking for the 05/17/19 encounter (Office Visit) with Delana Meyer, Dolores Lory, MD.    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   LIMA to LAD SVG to [place] SVG SEQUENTIALLY to OM1 and OM2 SVG to ACUTE MARGINAL   Spinal stenosis of cervical region    Vitamin D deficiency     Past Surgical History:  Procedure Laterality Date   BACK SURGERY  2018    fusion with screws   CHOLECYSTECTOMY N/A 11/13/2018   Procedure: LAPAROSCOPIC CHOLECYSTECTOMY;  Surgeon: Vickie Epley, MD;  Location: [facility];  Service: General;  Laterality: N/A;   COLONOSCOPY     CORONARY ARTERY BYPASS GRAFT N/A 09/30/2017   Procedure: CORONARY ARTERY BYPASS GRAFTING times five using right and left Saphaneous vein harvested endoscopicly  and left internal mammary artery. (CABG),TEE;  Surgeon: Rexene Alberts, MD;  Location: [place];  Service: Open Heart Surgery;  Laterality: N/A;   LEFT HEART CATH AND CORONARY ANGIOGRAPHY Left 09/06/2017   Procedure: LEFT HEART CATH AND CORONARY ANGIOGRAPHY;  Surgeon: Corey Skains, MD;  Location: [place] [facility];  Service: Cardiovascular;  Laterality: Left;   percutaneous transluminal balloon angioplasty  01/2010   of left lower extremity   TEE WITHOUT CARDIOVERSION N/A 09/30/2017   Procedure: TRANSESOPHAGEAL  ECHOCARDIOGRAM (TEE);  Surgeon: Rexene Alberts, MD;  [facility];  Service: Open Heart Surgery;  Laterality: N/A;   VASCULAR SURGERY Left 2010   left exernal iliac and superficial femoral artery PTCA and stenting    Social History Social History   Tobacco Use   Smoking status: Former Smoker    Packs/day: 0.75    Years: 50.00    Pack years: 37.50    Types: Cigarettes    Quit date: 09/27/2017    Years since quitting: 1.6   Smokeless tobacco: Former  Systems developer    Types: Chew  Substance Use Topics   Alcohol use: Not Currently    Comment: stopped drinking before cabg   Drug use: Yes    Types: Marijuana    Comment: stopped smoking before CABG    Family History Family History  Problem Relation Age of Onset   Emphysema Father   No family history of bleeding/clotting disorders, porphyria or autoimmune disease   Allergies  Allergen Reactions   Lipitor [Atorvastatin] Other (See Comments)    MYALGIA   Zetia [Ezetimibe] Other (See Comments)    MYALGIA   Lisinopril Cough   Protonix [Pantoprazole] Other (See Comments)    headache   Spironolactone Other (See Comments)    Breast tenderness     REVIEW OF SYSTEMS (Negative unless checked)  Constitutional: [] Weight loss  [] Fever  [] Chills Cardiac: [x] Chest pain   [] Chest pressure   [] Palpitations   [] Shortness of breath when laying flat   [] Shortness of breath with exertion. Vascular:  [x] Pain in legs with walking   [] Pain in legs at rest  [] History of DVT   [] Phlebitis   [] Swelling in legs   [] Varicose veins   [] Non-healing ulcers Pulmonary:   [] Uses home oxygen   [] Productive cough   [] Hemoptysis   [] Wheeze  [x] COPD   [] Asthma Neurologic:  [] Dizziness   [] Seizures   [] History of stroke   [] History of TIA  [] Aphasia   [] Vissual changes   [] Weakness or numbness in arm   [] Weakness or numbness in leg Musculoskeletal:   [] Joint swelling   [] Joint pain   [] Low back pain Hematologic:  [] Easy bruising  [] Easy bleeding   [] Hypercoagulable state   [] Anemic Gastrointestinal:  [] Diarrhea   [] Vomiting  [x] Gastroesophageal reflux/heartburn   [] Difficulty swallowing. Genitourinary:  [] Chronic kidney disease   [] Difficult urination  [] Frequent urination   [] Blood in urine Skin:  [] Rashes   [] Ulcers  Psychological:  [] History of anxiety   []  History of major depression.  Physical Examination  There were no vitals filed for this visit. There is no height or weight on file to calculate  BMI. Gen: WD/WN, NAD Head: /AT, No temporalis wasting.  Ear/Nose/Throat: Hearing grossly intact, nares w/o erythema or drainage, poor dentition Eyes: PER, EOMI, sclera nonicteric.  Neck: Supple, no masses.  No bruit or JVD.  Pulmonary:  Good air movement, clear to auscultation bilaterally, no use of accessory muscles.  Cardiac: RRR, normal S1, S2, no Murmurs. Vascular: bilateral carotid bruits Vessel Right Left  Radial Palpable Palpable  PT Not Palpable Not Palpable  DP Not Palpable Not Palpable  Gastrointestinal: soft, non-distended. No guarding/no peritoneal signs.  Musculoskeletal: M/S 5/5 throughout.  No deformity or atrophy.  Neurologic: CN 2-12 intact. Pain and light touch intact in extremities.  Symmetrical.  Speech is fluent. Motor exam as listed above. Psychiatric: Judgment intact, Mood & affect appropriate for pt's clinical situation. Dermatologic: No rashes or ulcers noted.  No changes consistent with cellulitis.  Lymph : No Cervical lymphadenopathy, no lichenification or skin changes of chronic lymphedema.  CBC Lab Results  Component Value Date   WBC 5.5 05/15/2019   HGB 11.2 (L) 05/15/2019   HCT 32.7 (L) 05/15/2019   MCV 80 05/15/2019   PLT 277 05/15/2019    BMET    Component Value Date/Time   NA 139 05/15/2019 1111   K 4.7 05/15/2019 1111   CL 103 05/15/2019 1111   CO2 19 (L) 05/15/2019 1111   GLUCOSE 111 (H) 05/15/2019 1111   GLUCOSE 124 (H) 11/06/2018 0903   BUN 23 05/15/2019 1111   CREATININE 1.27 05/15/2019 1111   CREATININE 0.95 06/17/2017 1035   CALCIUM 9.9 05/15/2019 1111   GFRNONAA 56 (L) 05/15/2019 1111   GFRAA 65 05/15/2019 1111   Estimated Creatinine Clearance: 57.9 mL/min (by C-G formula based on SCr of 1.27 mg/dL).  COAG Lab Results  Component Value Date   INR 1.28 09/30/2017   INR 0.95 09/27/2017    Radiology US Abdomen Complete  Result Date: 05/15/2019 CLINICAL DATA:  73 year old male with right upper quadrant abdominal pain.  Cholecystectomy in 2019. EXAM: ABDOMEN ULTRASOUND COMPLETE COMPARISON:  Chest CT 05/10/2019. Ultrasound 07/24/2018. Lumbar MRI 01/15/2017. FINDINGS: Gallbladder: Surgically absent. Common bile duct: Diameter: 2 millimeters, normal. Liver: Around hypoechoic lesion in the right lobe on image 15 measures up to 20 millimeters diameter. 3 smaller round hypoechoic lesions are suggested in the left lobe (image 25) and central right lobe (image 26). These were not evident in 2019. Background liver echogenicity is within normal limits. Portal vein is patent on color Doppler imaging with normal direction of blood flow towards the liver. IVC: No abnormality visualized. Pancreas: Incompletely visualized due to overlying bowel gas, visualized portions within normal limits. Spleen: Size and appearance within normal limits. Right Kidney: Length: 11.8 centimeters. Chronic left renal lower pole cyst appears not significantly changed since 2018 and benign with no vascular elements (image 47). No right hydronephrosis. Left Kidney: Length: 12.8 centimeters. Stable small benign appearing midpole cyst since 2018, 19 millimeters. No left hydronephrosis. Abdominal aorta: No aneurysm visualized. Other findings: No free fluid. IMPRESSION: 1. Multiple new round hypoechoic liver lesions which were not evident on the 2019 ultrasound range from 8 mm to 20 mm diameter. These are nonspecific but suspicious for hepatic metastatic disease. Given that Chest CT was just recently performed on 05/10/2019, recommend follow-up CT Abdomen and Pelvis with oral and IV contrast. 2. Otherwise negative abdomen ultrasound status post cholecystectomy. These results will be called to the ordering clinician or representative by the Radiologist Assistant, and communication documented in the PACS or zVision Dashboard. Electronically Signed   By: Genevie Ann M.D.   On: 05/15/2019 14:02   Ct Abdomen Pelvis W Contrast  Result Date: 05/16/2019 CLINICAL DATA:  Heavy  alcohol abuse. Abdominal pain for 10 days. Indeterminate hypoechoic liver lesions on recent sonogram. EXAM: CT ABDOMEN AND PELVIS WITH CONTRAST TECHNIQUE: Multidetector CT imaging of the abdomen and pelvis was performed using the standard protocol following bolus administration of intravenous contrast. CONTRAST:  144mL OMNIPAQUE IOHEXOL 300 MG/ML  SOLN COMPARISON:  05/15/2019 abdominal sonogram. FINDINGS: Lower chest: There is a 4.4 x 2.6 cm pleural based mass at the extreme medial basilar left lung base (series 2/image 12). A few scattered tiny solid pulmonary nodules at the right lung base, largest 4 mm in the right lower lobe (series 3/image 11). Coronary atherosclerosis. Visualized lower sternotomy wires are intact. Hepatobiliary: There are several (at least 8) ill-defined  hypodense liver masses scattered throughout the liver, largest 3.1 x 2.7 cm in segment 6 right liver lobe (series 2/image 25) and 1.5 x 1.1 cm in the segment 7 right liver lobe (series 2/image 20). Cholecystectomy. No biliary ductal dilatation. Pancreas: There is a poorly marginated heterogeneous hypodense pancreatic mass at the uncinate process measuring approximately 2.9 x 2.7 cm (series 2/image 31). This mass abuts approximately 30-40% of the circumference of the proximal SMA posteriorly (series 2/image 29). The mass encases a tributary of the SMV (series 2/image 33). No involvement by the mass of the portosplenic venous confluence or main portal vein. No pancreatic duct dilation. Spleen: Normal size. No mass. Adrenals/Urinary Tract: Normal adrenals. Simple bilateral renal cysts, largest 4.5 cm in the lower right kidney. Scattered subcentimeter hypodense renal cortical lesions in both kidneys are too small to characterize. No hydronephrosis. Normal bladder. Stomach/Bowel: Normal non-distended stomach. Normal caliber small bowel with no small bowel wall thickening. Normal appendix. Normal large bowel with no diverticulosis, large bowel wall  thickening or pericolonic fat stranding. Vascular/Lymphatic: Atherosclerotic nonaneurysmal abdominal aorta. Patent portal, splenic, hepatic and renal veins. A few mildly enlarged portacaval nodes, largest 1.4 cm (series 2/image 23). Mildly enlarged 1.0 cm aortocaval node (series 2/image 35). No pelvic adenopathy. Reproductive: Mildly enlarged prostate. Other: No pneumoperitoneum, ascites or focal fluid collection. Musculoskeletal: No aggressive appearing focal osseous lesions. Status post posterior spinal fixation bilaterally at L4-5. Moderate thoracolumbar spondylosis. IMPRESSION: 1. Poorly marginated heterogeneous hypodense 2.9 cm pancreatic mass at the uncinate process, worrisome for primary pancreatic adenocarcinoma. No biliary or pancreatic duct dilation. MRI abdomen without and with IV contrast is indicated for further evaluation. 2. Several ill-defined hypodense liver masses scattered throughout the liver, largest 3.1 cm in the segment 6 right liver lobe, worrisome for liver metastases. 3. Nonspecific portacaval and aortocaval adenopathy, worrisome for metastatic nodes. 4. 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 Additional tiny pulmonary nodules scattered at the right lung base are indeterminate and should be reassessed on follow-up chest CT in 3 months. 5. Mild prostatomegaly. 6.  Aortic Atherosclerosis (ICD10-I70.0). These results will be called to the ordering clinician or representative by the Radiologist Assistant, and communication documented in the PACS or zVision Dashboard. Electronically Signed   By: Ilona Sorrel M.D.   On: 05/16/2019 15:13   Ct Chest Lung Cancer Screening Low Dose Wo Contrast  Result Date: 05/10/2019 CLINICAL DATA:  73 year old male with 37 pack-year history of smoking. Lung cancer screening. EXAM: CT CHEST WITHOUT CONTRAST LOW-DOSE FOR LUNG CANCER SCREENING TECHNIQUE: Multidetector CT imaging of the chest was performed following  the standard protocol without IV contrast. COMPARISON:  None. FINDINGS: Cardiovascular: The heart size is normal. No substantial pericardial effusion. Status post CABG. Atherosclerotic calcification is noted in the wall of the thoracic aorta. Mediastinum/Nodes: No mediastinal lymphadenopathy. No evidence for gross hilar lymphadenopathy although assessment is limited by the lack of intravenous contrast on today's study. The esophagus has normal imaging features. There is no axillary lymphadenopathy. Lungs/Pleura: Numerous tiny pulmonary nodules are seen scattered in both lungs, measuring up to a maximum volume derived equivalent diameter of 4.4 mm. No overtly suspicious nodule or mass. No pleural effusion. Centrilobular emphsyema noted. Upper Abdomen: Unremarkable. Musculoskeletal: No worrisome lytic or sclerotic osseous abnormality. Centrilobular emphsyema noted. IMPRESSION: 1. Lung-RADS 2, benign appearance or behavior. Continue annual screening with low-dose chest CT without contrast in 12 months. 2.  Aortic Atherosclerois (ICD10-170.0) 3.  Emphysema. (IOX73-Z32.9) Electronically Signed  By: Misty Stanley M.D.   On: 05/10/2019 17:31     Assessment/Plan 1. Atherosclerosis of native artery of both lower extremities with intermittent claudication (HCC) Recommend:  Patient should undergo arterial duplex of the lower extremity ASAP because there has been a significant deterioration in the patient's lower extremity symptoms.  The patient states they are having increased pain and a marked decrease in the distance that they can walk.  The risks and benefits as well as the alternatives were discussed in detail with the patient.  All questions were answered.  Patient agrees to proceed and understands this could be a prelude to angiography and intervention.  The patient will follow up with me in the office to review the studies.  - VAS [place] ABI WITH/WO TBI; Future - VAS US AORTA/IVC/ILIACS; Future  2.  Bilateral carotid bruits Recommend:  Given the patient's asymptomatic carotid bruit Duplex ultrasound is indicated  Continue antiplatelet therapy as prescribed Continue management of CAD, HTN and Hyperlipidemia Healthy heart diet,  encouraged exercise at least 4 times per week Follow up in 1 month with duplex ultrasound and physical exam   - VAS US CAROTID; Future  3. Essential (primary) hypertension Continue antihypertensive medications as already ordered, these medications have been reviewed and there are no changes at this time.   4. Hyperlipemia, mixed Continue statin as ordered and reviewed, no changes at this time   5. Coronary artery disease of native artery of native heart with stable angina pectoris (HCC) Continue cardiac and antihypertensive medications as already ordered and reviewed, no changes at this time.  Continue statin as ordered and reviewed, no changes at this time  Nitrates PRN for chest pain   6. Gastroesophageal reflux disease without esophagitis Continue PPI as already ordered, this medication has been reviewed and there are no changes at this time.  Avoidence of caffeine and alcohol  Moderate elevation of the head of the bed     Hortencia Pilar, MD  05/17/2019 9:17 AM

## 2019-05-17 NOTE — Progress Notes (Signed)
Patient here today for new patient consult.

## 2019-05-18 ENCOUNTER — Other Ambulatory Visit: Payer: Self-pay

## 2019-05-18 LAB — CANCER ANTIGEN 19-9: CA 19-9: 9507 U/mL — ABNORMAL HIGH (ref 0–35)

## 2019-05-18 LAB — CEA: CEA: 98.9 ng/mL — ABNORMAL HIGH (ref 0.0–4.7)

## 2019-05-18 LAB — AFP TUMOR MARKER: AFP, Serum, Tumor Marker: 2.8 ng/mL (ref 0.0–8.3)

## 2019-05-18 NOTE — Progress Notes (Addendum)
Hematology/Oncology Consult note [facility] [phone] [phone]   Patient Care Team: Paulene Floor as PCP - General (Physician Assistant) Clent Jacks, RN as Oncology Nurse Navigator  REFERRING PROVIDER: Trinna Post, PA-C  CHIEF COMPLAINTS/REASON FOR VISIT:  Evaluation of abnormal CT  HISTORY OF PRESENTING ILLNESS:   Craig [name] is a  73 y.o.  male with PMH listed below, including CABG x5, PVD, hypertension, former tobacco abuse, former alcohol abuse, iron deficiency anemia, was seen in consultation at the request of  Terrilee Croak, Adriana M, PA-C  for evaluation of abnormal CT Patient recently presented to primary care provider complaining for abdominal pain radiating to his back for about 10 days.  Patient uses Tylenol as needed for pain. Work-up showed elevated amylase, lipase, mild anemia with hemoglobin of 11.3 Acute hepatitis panel negative. 05/16/2019 CT abdomen pelvis with contrast showed poorly marginated heterogeneous hypodense 2.9 cm pancreatic mass at the uncinate process, worrisome for primary pancreatic cancer.  No biliary or pancreatic duct dilation. Several ill defined hypodense liver masses scattered throughout the liver, largest 3.1 cm in the segment 6 right liver lobe. Nonspecific portacaval and aortocaval adenopathy. Extreme medial left lung base 4.4 cm pleural-based mass probably a pleural metastasis.  Additional tiny pulmonary nodules scattered at the right lung base are indeterminate. Mild prostate or megaly  Patient was referred to heme-onc for further evaluation. Patient reports that his abdominal pain was 10 out of 10 a few days ago, he uses Tylenol as needed. Today his pain is getting better, 2 out of 10.  Denies any nausea, vomiting, diarrhea, fever or chills. Married, lives with wife.  Appetite is good.  He weighs 195 pounds today at the clinic. His weight was 204 pounds back in May 2020.  He  had a history of 37.5-pack-year smoking history quitting 2018. No longer drinking alcohol.  Review of Systems  Constitutional: Positive for fatigue and unexpected weight change. Negative for appetite change, chills and fever.  HENT:   Negative for hearing loss and voice change.   Eyes: Negative for eye problems and icterus.  Respiratory: Negative for chest tightness, cough and shortness of breath.   Cardiovascular: Negative for chest pain and leg swelling.  Gastrointestinal: Positive for abdominal pain. Negative for abdominal distention.  Endocrine: Negative for hot flashes.  Genitourinary: Negative for difficulty urinating, dysuria and frequency.   Musculoskeletal: Negative for arthralgias.  Skin: Negative for itching and rash.  Neurological: Negative for light-headedness and numbness.  Hematological: Negative for adenopathy. Does not bruise/bleed easily.  Psychiatric/Behavioral: Negative for confusion.    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   LIMA to LAD SVG to DISTAL CIRCUMFLEX SVG SEQUENTIALLY to OM1 and OM2 SVG to ACUTE MARGINAL   Spinal stenosis of cervical region    Vitamin D deficiency     SURGICAL HISTORY: Past Surgical History:  Procedure Laterality Date   BACK SURGERY  2018    fusion with screws   CHOLECYSTECTOMY N/A 11/13/2018   Procedure: LAPAROSCOPIC CHOLECYSTECTOMY;  Surgeon: Vickie Epley, MD;  Location: [facility];  Service: General;  Laterality: N/A;   COLONOSCOPY     CORONARY  ARTERY BYPASS GRAFT N/A 09/30/2017   Procedure: CORONARY ARTERY BYPASS GRAFTING times five using right and left Saphaneous vein harvested endoscopicly  and left internal mammary artery. (CABG),TEE;  Surgeon: Rexene Alberts, MD;  Location: [place];  Service: Open Heart Surgery;  Laterality: N/A;   LEFT HEART CATH AND CORONARY ANGIOGRAPHY Left 09/06/2017   Procedure: LEFT HEART CATH AND CORONARY ANGIOGRAPHY;  Surgeon: Corey Skains, MD;  Location: [place] [facility];  Service: Cardiovascular;  Laterality: Left;   percutaneous transluminal balloon angioplasty  01/2010   of left lower extremity   TEE WITHOUT CARDIOVERSION N/A 09/30/2017   Procedure: TRANSESOPHAGEAL ECHOCARDIOGRAM (TEE);  Surgeon: Rexene Alberts, MD;  Location: [place];  Service: Open Heart Surgery;  Laterality: N/A;   VASCULAR SURGERY Left 2010   left exernal iliac and superficial femoral artery PTCA and stenting    SOCIAL HISTORY: Social History   Socioeconomic History   Marital status: Married    Spouse name: Enid Derry   Number of children: Not on file   Years of education: Not on file   Highest education level: Not on file  Occupational History   Occupation: drove tractors    Comment: retired  Scientist, product/process development strain: Not on file   Food insecurity    Worry: Not on file    Inability: Not on Lexicographer needs    Medical: Not on file    Non-medical: Not on file  Tobacco Use   Smoking status: Former Smoker    Packs/day: 0.75    Years: 50.00    Pack years: 37.50    Types: Cigarettes    Quit date: 09/27/2017    Years since quitting: 1.6   Smokeless tobacco: Former Systems developer    Types: Chew  Substance and Sexual Activity   Alcohol use: Not Currently    Comment: stopped drinking before cabg   Drug use: Yes    Types: Marijuana    Comment: stopped smoking before CABG   Sexual activity: Not Currently  Lifestyle   Physical activity    Days per week: Not on file     Minutes per session: Not on file   Stress: Not on file  Relationships   Social connections    Talks on phone: Not on file    Gets together: Not on file    Attends religious service: Not on file    Active member of club or organization: Not on file    Attends meetings of clubs or organizations: Not on file    Relationship status: Not on file   Intimate partner violence    Fear of current or ex partner: Not on file    Emotionally abused: Not on file    Physically abused: Not on file    Forced sexual activity: Not on file  Other Topics Concern   Not on file  Social History Narrative   Not on file    FAMILY HISTORY: Family History  Problem Relation Age of Onset   Brain cancer Mother    Emphysema Father    Cancer Brother     ALLERGIES:  is allergic to lipitor [atorvastatin]; zetia [ezetimibe]; lisinopril; protonix [pantoprazole]; and spironolactone.  MEDICATIONS:  Current Outpatient Medications  Medication Sig Dispense Refill   acetaminophen (TYLENOL) 500 MG tablet Take 2 tablets (1,000 mg total) by mouth every 6 (six) hours. (Patient taking differently: Take 1,000 mg by mouth every 6 (six) hours as needed for moderate pain. ) 30 tablet 0   aspirin EC 81 MG tablet Take 81 mg by mouth daily.     carvedilol (COREG) 6.25 MG tablet Take 6.25 mg by mouth 2 (two) times daily with a meal.  3   diltiazem (CARDIZEM CD) 300 MG 24 hr capsule Take 1 capsule (300 mg total) by mouth daily. 90 capsule 1   dorzolamide-timolol (COSOPT) 22.3-6.8 MG/ML ophthalmic solution INSTILL ONE DROP INTO BOTH EYES TWICE DAILY     ferrous sulfate 325 (65 FE) MG tablet Take 325 mg by mouth daily with breakfast.     gabapentin (NEURONTIN) 300 MG capsule Take 2 capsules (600 mg total) by mouth 3 (three) times daily. 540 capsule 0   hydrochlorothiazide (HYDRODIURIL) 25 MG tablet Take 1 tablet (25 mg total) by mouth daily. 90 tablet 3   latanoprost (XALATAN) 0.005 % ophthalmic solution Place 1 drop  into both eyes at bedtime.   3   Multiple Vitamin (MULTIVITAMIN WITH MINERALS) TABS tablet Take 1 tablet by mouth daily.     OMEGA-3 FATTY ACIDS PO Take 1,200 mg by mouth daily.      omeprazole (PRILOSEC) 40 MG capsule TAKE 1 CAPSULE BY MOUTH EVERY DAY 90 capsule 0   rosuvastatin (CRESTOR) 10 MG tablet Take 1 tablet (10 mg total) by mouth daily. 90 tablet 0   sucralfate (CARAFATE) 1 g tablet Take 1 tablet (1 g total) by mouth 4 (four) times daily -  with meals and at bedtime. 28 tablet 0   triamcinolone cream (KENALOG) 0.1 % APPLY 1 APPLICATION TOPICALLY 2 (TWO) TIMES DAILY. TO EXTERNAL EAR 30 g 0   valsartan (DIOVAN) 160 MG tablet Take 1 tablet (160 mg total) by mouth daily. 180 tablet 3   No current facility-administered medications for this visit.      PHYSICAL EXAMINATION: ECOG PERFORMANCE STATUS: 0 - Asymptomatic Vitals:   05/17/19 1133  BP: 129/67  Pulse: 76  Temp: (!) 96.4 F (35.8 C)   Filed Weights   05/17/19 1133  Weight: 195 lb 9 oz (88.7 kg)    Physical Exam Constitutional:      General: He is not in acute distress. HENT:     Head: Normocephalic and atraumatic.  Eyes:     General: No scleral icterus.    Pupils: Pupils are equal, round, and reactive to light.  Neck:     Musculoskeletal: Normal range of motion and neck supple.  Cardiovascular:     Rate and Rhythm: Normal rate and regular rhythm.     Heart sounds: Normal heart sounds.  Pulmonary:     Effort: Pulmonary effort is normal. No respiratory distress.     Breath sounds: No wheezing.  Abdominal:     General: Bowel sounds are normal. There is no distension.     Palpations: Abdomen is soft. There is no mass.     Tenderness: There is no abdominal tenderness.  Musculoskeletal: Normal range of motion.        General: No deformity.  Skin:    General: Skin is warm and dry.     Findings: No erythema or rash.  Neurological:     Mental Status: He is alert and oriented to person, place, and time.      Cranial Nerves: No  cranial nerve deficit.     Coordination: Coordination normal.  Psychiatric:        Behavior: Behavior normal.        Thought Content: Thought content normal.     LABORATORY DATA:  I have reviewed the data as listed Lab Results  Component Value Date   WBC 5.5 05/17/2019   HGB 11.0 (L) 05/17/2019   HCT 34.8 (L) 05/17/2019   MCV 85.3 05/17/2019   PLT 288 05/17/2019   Recent Labs    03/12/19 1020 05/15/19 1111 05/17/19 1212  NA 138 139 140  K 4.4 4.7 4.4  CL 102 103 106  CO2 20 19* 24  GLUCOSE 117* 111* 131*  BUN 16 23 23   CREATININE 1.21 1.27 1.36*  CALCIUM 9.7 9.9 9.4  GFRNONAA 59* 56* 52*  GFRAA 69 65 60*  PROT 7.2 6.8 8.2*  ALBUMIN 4.7 4.3 4.2  AST 19 21 31   ALT 15 18 30   ALKPHOS 77 71 67  BILITOT 0.3 <0.2 0.5   Iron/TIBC/Ferritin/ %Sat    Component Value Date/Time   IRON 119 04/27/2019 0940   TIBC 363 04/27/2019 0940   FERRITIN 58 04/27/2019 0940   IRONPCTSAT 33 04/27/2019 0940      RADIOGRAPHIC STUDIES: I have personally reviewed the radiological images as listed and agreed with the findings in the report.  US Abdomen Complete  Result Date: 05/15/2019 CLINICAL DATA:  73 year old male with right upper quadrant abdominal pain. Cholecystectomy in 2019. EXAM: ABDOMEN ULTRASOUND COMPLETE COMPARISON:  Chest CT 05/10/2019. Ultrasound 07/24/2018. Lumbar MRI 01/15/2017. FINDINGS: Gallbladder: Surgically absent. Common bile duct: Diameter: 2 millimeters, normal. Liver: Around hypoechoic lesion in the right lobe on image 15 measures up to 20 millimeters diameter. 3 smaller round hypoechoic lesions are suggested in the left lobe (image 25) and central right lobe (image 26). These were not evident in 2019. Background liver echogenicity is within normal limits. Portal vein is patent on color Doppler imaging with normal direction of blood flow towards the liver. IVC: No abnormality visualized. Pancreas: Incompletely visualized due to overlying bowel gas,  visualized portions within normal limits. Spleen: Size and appearance within normal limits. Right Kidney: Length: 11.8 centimeters. Chronic left renal lower pole cyst appears not significantly changed since 2018 and benign with no vascular elements (image 47). No right hydronephrosis. Left Kidney: Length: 12.8 centimeters. Stable small benign appearing midpole cyst since 2018, 19 millimeters. No left hydronephrosis. Abdominal aorta: No aneurysm visualized. Other findings: No free fluid. IMPRESSION: 1. Multiple new round hypoechoic liver lesions which were not evident on the 2019 ultrasound range from 8 mm to 20 mm diameter. These are nonspecific but suspicious for hepatic metastatic disease. Given that Chest CT was just recently performed on 05/10/2019, recommend follow-up CT Abdomen and Pelvis with oral and IV contrast. 2. Otherwise negative abdomen ultrasound status post cholecystectomy. These results will be called to the ordering clinician or representative by the Radiologist Assistant, and communication documented in the PACS or zVision Dashboard. Electronically Signed   By: Genevie Ann M.D.   On: 05/15/2019 14:02   Ct Abdomen Pelvis W Contrast  Result Date: 05/16/2019 CLINICAL DATA:  Heavy alcohol abuse. Abdominal pain for 10 days. Indeterminate hypoechoic liver lesions on recent sonogram. EXAM: CT ABDOMEN AND PELVIS WITH CONTRAST TECHNIQUE: Multidetector CT imaging of the abdomen and pelvis was performed using the standard protocol following bolus administration of intravenous contrast. CONTRAST:  135mL OMNIPAQUE IOHEXOL 300 MG/ML  SOLN COMPARISON:  05/15/2019 abdominal sonogram. FINDINGS: Lower chest:  There is a 4.4 x 2.6 cm pleural based mass at the extreme medial basilar left lung base (series 2/image 12). A few scattered tiny solid pulmonary nodules at the right lung base, largest 4 mm in the right lower lobe (series 3/image 11). Coronary atherosclerosis. Visualized lower sternotomy wires are intact.  Hepatobiliary: There are several (at least 8) ill-defined hypodense liver masses scattered throughout the liver, largest 3.1 x 2.7 cm in segment 6 right liver lobe (series 2/image 25) and 1.5 x 1.1 cm in the segment 7 right liver lobe (series 2/image 20). Cholecystectomy. No biliary ductal dilatation. Pancreas: There is a poorly marginated heterogeneous hypodense pancreatic mass at the uncinate process measuring approximately 2.9 x 2.7 cm (series 2/image 31). This mass abuts approximately 30-40% of the circumference of the proximal SMA posteriorly (series 2/image 29). The mass encases a tributary of the SMV (series 2/image 33). No involvement by the mass of the portosplenic venous confluence or main portal vein. No pancreatic duct dilation. Spleen: Normal size. No mass. Adrenals/Urinary Tract: Normal adrenals. Simple bilateral renal cysts, largest 4.5 cm in the lower right kidney. Scattered subcentimeter hypodense renal cortical lesions in both kidneys are too small to characterize. No hydronephrosis. Normal bladder. Stomach/Bowel: Normal non-distended stomach. Normal caliber small bowel with no small bowel wall thickening. Normal appendix. Normal large bowel with no diverticulosis, large bowel wall thickening or pericolonic fat stranding. Vascular/Lymphatic: Atherosclerotic nonaneurysmal abdominal aorta. Patent portal, splenic, hepatic and renal veins. A few mildly enlarged portacaval nodes, largest 1.4 cm (series 2/image 23). Mildly enlarged 1.0 cm aortocaval node (series 2/image 35). No pelvic adenopathy. Reproductive: Mildly enlarged prostate. Other: No pneumoperitoneum, ascites or focal fluid collection. Musculoskeletal: No aggressive appearing focal osseous lesions. Status post posterior spinal fixation bilaterally at L4-5. Moderate thoracolumbar spondylosis. IMPRESSION: 1. Poorly marginated heterogeneous hypodense 2.9 cm pancreatic mass at the uncinate process, worrisome for primary pancreatic adenocarcinoma.  No biliary or pancreatic duct dilation. MRI abdomen without and with IV contrast is indicated for further evaluation. 2. Several ill-defined hypodense liver masses scattered throughout the liver, largest 3.1 cm in the segment 6 right liver lobe, worrisome for liver metastases. 3. Nonspecific portacaval and aortocaval adenopathy, worrisome for metastatic nodes. 4. 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 Additional tiny pulmonary nodules scattered at the right lung base are indeterminate and should be reassessed on follow-up chest CT in 3 months. 5. Mild prostatomegaly. 6.  Aortic Atherosclerosis (ICD10-I70.0). These results will be called to the ordering clinician or representative by the Radiologist Assistant, and communication documented in the PACS or zVision Dashboard. Electronically Signed   By: Ilona Sorrel M.D.   On: 05/16/2019 15:13   Ct Chest Lung Cancer Screening Low Dose Wo Contrast  Result Date: 05/10/2019 CLINICAL DATA:  73 year old male with 56 pack-year history of smoking. Lung cancer screening. EXAM: CT CHEST WITHOUT CONTRAST LOW-DOSE FOR LUNG CANCER SCREENING TECHNIQUE: Multidetector CT imaging of the chest was performed following the standard protocol without IV contrast. COMPARISON:  None. FINDINGS: Cardiovascular: The heart size is normal. No substantial pericardial effusion. Status post CABG. Atherosclerotic calcification is noted in the wall of the thoracic aorta. Mediastinum/Nodes: No mediastinal lymphadenopathy. No evidence for gross hilar lymphadenopathy although assessment is limited by the lack of intravenous contrast on today's study. The esophagus has normal imaging features. There is no axillary lymphadenopathy. Lungs/Pleura: Numerous tiny pulmonary nodules are seen scattered in both lungs, measuring up to a maximum volume derived equivalent diameter of 4.4  mm. No overtly suspicious nodule or mass. No pleural effusion. Centrilobular  emphsyema noted. Upper Abdomen: Unremarkable. Musculoskeletal: No worrisome lytic or sclerotic osseous abnormality. Centrilobular emphsyema noted. IMPRESSION: 1. Lung-RADS 2, benign appearance or behavior. Continue annual screening with low-dose chest CT without contrast in 12 months. 2.  Aortic Atherosclerois (ICD10-170.0) 3.  Emphysema. ([id number]) Electronically Signed   By: Misty Stanley M.D.   On: 05/10/2019 17:31      ASSESSMENT & PLAN:  1. Pancreatic mass   2. Liver mass   3. Pleural mass    CT images were independently reviewed by me and discussed with patient. Concerning for metastatic cancer, i.e. metastatic pancreatic cancer. I had a discussion with patient about possible cancer diagnosis. Recommend liver biopsy to establish tissue diagnosis.  Check tumor markers including CEA, CA-19-9, AFP.  PT, PTT, lipase  Abdominal pain, currently 2 out of 10, If symptoms worsen, will consider starting opioid for better pain control  He mentioned that his wife has questions.  I offered to talk to wife or other family members if he would like to me to.  Patient declines.  He prefers to keep everything to himself at this moment.  Orders Placed This Encounter  Procedures   Protime-INR    Standing Status:   Future    Number of Occurrences:   1    Standing Expiration Date:   05/16/2020   APTT    Standing Status:   Future    Number of Occurrences:   1    Standing Expiration Date:   05/16/2020   Cancer antigen 19-9    Standing Status:   Future    Number of Occurrences:   1    Standing Expiration Date:   05/16/2020   CEA    Standing Status:   Future    Number of Occurrences:   1    Standing Expiration Date:   05/16/2020   AFP tumor marker    Standing Status:   Future    Number of Occurrences:   1    Standing Expiration Date:   05/16/2020   CBC with Differential/Platelet    Standing Status:   Future    Number of Occurrences:   1    Standing Expiration Date:   05/16/2020    Comprehensive metabolic panel    Standing Status:   Future    Number of Occurrences:   1    Standing Expiration Date:   05/16/2020    All questions were answered. The patient knows to call the clinic with any problems questions or concerns.  cc Trinna Post, PA-C    Return of visit: A week after biopsy to discuss pathology results. Thank you for this kind referral and the opportunity to participate in the care of this patient. A copy of today's note is routed to referring provider  Total face to face encounter time for this patient visit was 60 min. >50% of the time was  spent in counseling and coordination of care.    Earlie Server, MD, PhD Hematology Oncology [place] [facility] 05/18/2019

## 2019-05-20 ENCOUNTER — Encounter (INDEPENDENT_AMBULATORY_CARE_PROVIDER_SITE_OTHER): Payer: Self-pay | Admitting: Vascular Surgery

## 2019-05-21 ENCOUNTER — Ambulatory Visit
Admission: RE | Admit: 2019-05-21 | Discharge: 2019-05-21 | Disposition: A | Discharge status: Home / Self Care | Payer: Medicare Other | Source: Ambulatory Visit | Attending: Oncology | Admitting: Oncology

## 2019-05-21 ENCOUNTER — Other Ambulatory Visit: Payer: Self-pay

## 2019-05-21 DIAGNOSIS — Z809 Family history of malignant neoplasm, unspecified: Secondary | ICD-10-CM | POA: Diagnosis not present

## 2019-05-21 DIAGNOSIS — K8689 Other specified diseases of pancreas: Secondary | ICD-10-CM | POA: Diagnosis not present

## 2019-05-21 DIAGNOSIS — E559 Vitamin D deficiency, unspecified: Secondary | ICD-10-CM | POA: Insufficient documentation

## 2019-05-21 DIAGNOSIS — Z87891 Personal history of nicotine dependence: Secondary | ICD-10-CM | POA: Insufficient documentation

## 2019-05-21 DIAGNOSIS — Z79899 Other long term (current) drug therapy: Secondary | ICD-10-CM | POA: Diagnosis not present

## 2019-05-21 DIAGNOSIS — Z888 Allergy status to other drugs, medicaments and biological substances status: Secondary | ICD-10-CM | POA: Diagnosis not present

## 2019-05-21 DIAGNOSIS — K219 Gastro-esophageal reflux disease without esophagitis: Secondary | ICD-10-CM | POA: Diagnosis not present

## 2019-05-21 DIAGNOSIS — Z951 Presence of aortocoronary bypass graft: Secondary | ICD-10-CM | POA: Diagnosis not present

## 2019-05-21 DIAGNOSIS — I110 Hypertensive heart disease with heart failure: Secondary | ICD-10-CM | POA: Diagnosis not present

## 2019-05-21 DIAGNOSIS — Z9049 Acquired absence of other specified parts of digestive tract: Secondary | ICD-10-CM | POA: Diagnosis not present

## 2019-05-21 DIAGNOSIS — M4320 Fusion of spine, site unspecified: Secondary | ICD-10-CM | POA: Insufficient documentation

## 2019-05-21 DIAGNOSIS — Z808 Family history of malignant neoplasm of other organs or systems: Secondary | ICD-10-CM | POA: Insufficient documentation

## 2019-05-21 DIAGNOSIS — I509 Heart failure, unspecified: Secondary | ICD-10-CM | POA: Insufficient documentation

## 2019-05-21 DIAGNOSIS — E785 Hyperlipidemia, unspecified: Secondary | ICD-10-CM | POA: Insufficient documentation

## 2019-05-21 DIAGNOSIS — M5030 Other cervical disc degeneration, unspecified cervical region: Secondary | ICD-10-CM | POA: Diagnosis not present

## 2019-05-21 DIAGNOSIS — Z7982 Long term (current) use of aspirin: Secondary | ICD-10-CM | POA: Insufficient documentation

## 2019-05-21 DIAGNOSIS — I25810 Atherosclerosis of coronary artery bypass graft(s) without angina pectoris: Secondary | ICD-10-CM | POA: Diagnosis not present

## 2019-05-21 DIAGNOSIS — R160 Hepatomegaly, not elsewhere classified: Secondary | ICD-10-CM | POA: Insufficient documentation

## 2019-05-21 DIAGNOSIS — I739 Peripheral vascular disease, unspecified: Secondary | ICD-10-CM | POA: Diagnosis not present

## 2019-05-21 DIAGNOSIS — M4802 Spinal stenosis, cervical region: Secondary | ICD-10-CM | POA: Insufficient documentation

## 2019-05-21 LAB — CBC
HCT: 32.5 % — ABNORMAL LOW (ref 39.0–52.0)
Hemoglobin: 10.1 g/dL — ABNORMAL LOW (ref 13.0–17.0)
MCH: 26.4 pg (ref 26.0–34.0)
MCHC: 31.1 g/dL (ref 30.0–36.0)
MCV: 85.1 fL (ref 80.0–100.0)
Platelets: 293 10*3/uL (ref 150–400)
RBC: 3.82 MIL/uL — ABNORMAL LOW (ref 4.22–5.81)
RDW: 15.3 % (ref 11.5–15.5)
WBC: 5.5 10*3/uL (ref 4.0–10.5)
nRBC: 0 % (ref 0.0–0.2)

## 2019-05-21 LAB — APTT: aPTT: 27 seconds (ref 24–36)

## 2019-05-21 LAB — PROTIME-INR
INR: 0.9 (ref 0.8–1.2)
Prothrombin Time: 12.1 seconds (ref 11.4–15.2)

## 2019-05-21 MED ORDER — FENTANYL CITRATE (PF) 100 MCG/2ML IJ SOLN
INTRAMUSCULAR | Status: AC
  Filled 2019-05-21: qty 4

## 2019-05-21 MED ORDER — FENTANYL CITRATE (PF) 100 MCG/2ML IJ SOLN
INTRAMUSCULAR | Status: AC | PRN
  Administered 2019-05-21: 25 ug via INTRAVENOUS
  Administered 2019-05-21: 50 ug via INTRAVENOUS
  Administered 2019-05-21: 25 ug via INTRAVENOUS

## 2019-05-21 MED ORDER — MIDAZOLAM HCL 2 MG/2ML IJ SOLN
INTRAMUSCULAR | Status: AC
  Filled 2019-05-21: qty 6

## 2019-05-21 MED ORDER — SODIUM CHLORIDE 0.9 % IV SOLN
INTRAVENOUS | Status: DC
  Administered 2019-05-21: 08:00:00 via INTRAVENOUS

## 2019-05-21 MED ORDER — MIDAZOLAM HCL 2 MG/2ML IJ SOLN
INTRAMUSCULAR | Status: AC | PRN
  Administered 2019-05-21: 0.5 mg via INTRAVENOUS
  Administered 2019-05-21: 1 mg via INTRAVENOUS
  Administered 2019-05-21: 2 mg via INTRAVENOUS
  Administered 2019-05-21: 1 mg via INTRAVENOUS

## 2019-05-21 NOTE — Progress Notes (Signed)
Patient ID: Craig [name], male   DOB: 1946-06-03, 73 y.o.   MRN: [id number] Pt stable after bx.Vss.Abd stable.D/c instructions given,f/u with his MD.

## 2019-05-21 NOTE — Progress Notes (Signed)
Dr.Register in at bedside to see and evaluate patient post liver biopsy.

## 2019-05-21 NOTE — H&P (Signed)
Chief Complaint: Patient was seen in consultation today for No chief complaint on file.  at the request of Yu,Zhou  Referring Physician(s): Yu,Zhou  Supervising Physician: Register  Patient Status: Liver bx  History of Present Illness: Craig [name] is a 73 y.o. male in liver bx.  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   LIMA to LAD SVG to DISTAL CIRCUMFLEX SVG SEQUENTIALLY to OM1 and OM2 SVG to ACUTE MARGINAL   Spinal stenosis of cervical region    Vitamin D deficiency     Past Surgical History:  Procedure Laterality Date   BACK SURGERY  2018    fusion with screws   CHOLECYSTECTOMY N/A 11/13/2018   Procedure: LAPAROSCOPIC CHOLECYSTECTOMY;  Surgeon: Vickie Epley, MD;  Location: [facility];  Service: General;  Laterality: N/A;   COLONOSCOPY     CORONARY ARTERY BYPASS GRAFT N/A 09/30/2017   Procedure: CORONARY ARTERY BYPASS GRAFTING times five using right and left Saphaneous vein harvested endoscopicly  and left internal mammary artery. (CABG),TEE;  Surgeon: Rexene Alberts, MD;  Location: [place];  Service: Open Heart Surgery;  Laterality: N/A;   LEFT HEART CATH AND CORONARY ANGIOGRAPHY Left 09/06/2017   Procedure: LEFT HEART CATH AND CORONARY ANGIOGRAPHY;  Surgeon: Corey Skains, MD;  Location: [place] [facility];  Service: Cardiovascular;  Laterality: Left;   percutaneous transluminal balloon angioplasty  01/2010   of left lower extremity   TEE WITHOUT CARDIOVERSION N/A 09/30/2017   Procedure:  TRANSESOPHAGEAL ECHOCARDIOGRAM (TEE);  Surgeon: Rexene Alberts, MD;  Location: [place];  Service: Open Heart Surgery;  Laterality: N/A;   VASCULAR SURGERY Left 2010   left exernal iliac and superficial femoral artery PTCA and stenting    Allergies: Lipitor [atorvastatin], Zetia [ezetimibe], Lisinopril, Protonix [pantoprazole], and Spironolactone  Medications: Prior to Admission medications   Medication Sig Start Date End Date Taking? Authorizing Provider  acetaminophen (TYLENOL) 500 MG tablet Take 2 tablets (1,000 mg total) by mouth every 6 (six) hours. Patient taking differently: Take 1,000 mg by mouth every 6 (six) hours as needed for moderate pain.  10/05/17  Yes [name], PA-C  aspirin EC 81 MG tablet Take 81 mg by mouth daily.   Yes [provider]  carvedilol (COREG) 6.25 MG tablet Take 6.25 mg by mouth 2 (two) times daily with a meal. 03/01/18  Yes [provider]  diltiazem (CARDIZEM CD) 300 MG 24 hr capsule Take 1 capsule (300 mg total) by mouth daily. 01/22/19  Yes Fenton Malling M, PA-C  dorzolamide-timolol (COSOPT) 22.3-6.8 MG/ML ophthalmic solution INSTILL ONE DROP INTO BOTH EYES TWICE DAILY 11/08/18  Yes [provider]  ferrous sulfate 325 (65 FE) MG tablet Take 325 mg by mouth daily with breakfast.   Yes [provider]  gabapentin (NEURONTIN) 300 MG capsule Take 2 capsules (600 mg total) by mouth 3 (three) times daily. 04/20/19  Yes Carles Collet M, PA-C  hydrochlorothiazide (HYDRODIURIL)  25 MG tablet Take 1 tablet (25 mg total) by mouth daily. 08/24/18  Yes Chauvin, Herbie [place], PA  latanoprost (XALATAN) 0.005 % ophthalmic solution Place 1 drop into both eyes at bedtime.  07/15/18  Yes [provider]  Multiple Vitamin (MULTIVITAMIN WITH MINERALS) TABS tablet Take 1 tablet by mouth daily.   Yes [provider]  OMEGA-3 FATTY ACIDS PO Take 1,200 mg by mouth daily.    Yes [provider]  omeprazole (PRILOSEC) 40  MG capsule TAKE 1 CAPSULE BY MOUTH EVERY DAY 05/17/19  Yes Carles Collet M, PA-C  rosuvastatin (CRESTOR) 10 MG tablet Take 1 tablet (10 mg total) by mouth daily. 04/27/19  Yes Carles Collet M, PA-C  sucralfate (CARAFATE) 1 g tablet Take 1 tablet (1 g total) by mouth 4 (four) times daily -  with meals and at bedtime. 05/15/19  Yes Carles Collet M, PA-C  triamcinolone cream (KENALOG) 0.1 % APPLY 1 APPLICATION TOPICALLY 2 (TWO) TIMES DAILY. TO EXTERNAL EAR 11/10/18  Yes Carmon Ginsberg, PA  valsartan (DIOVAN) 160 MG tablet Take 1 tablet (160 mg total) by mouth daily. 03/09/19  Yes Chrismon, Vickki Muff, PA     Family History  Problem Relation Age of Onset   Brain cancer Mother    Emphysema Father    Cancer Brother     Social History   Socioeconomic History   Marital status: Married    Spouse name: Enid Derry   Number of children: 2   Years of education: Not on file   Highest education level: Not on file  Occupational History   Occupation: drove tractors    Comment: retired  Scientist, product/process development strain: Not hard at [employer] insecurity    Worry: Never true    Inability: Never true   Transportation needs    Medical: No    Non-medical: No  Tobacco Use   Smoking status: Former Smoker    Packs/day: 0.75    Years: 50.00    Pack years: 37.50    Types: Cigarettes    Quit date: 09/27/2017    Years since quitting: 1.6   Smokeless tobacco: Former Systems developer    Types: Chew  Substance and Sexual Activity   Alcohol use: Not Currently    Comment: stopped drinking before cabg   Drug use: Not Currently    Types: Marijuana    Comment: stopped smoking before CABG   Sexual activity: Not Currently  Lifestyle   Physical activity    Days per week: 5 days    Minutes per session: 150+ min   Stress: Not at all  Relationships   Social connections    Talks on phone: More than three times a week    Gets together: More than three times a week    Attends religious  service: Never    Active member of club or organization: No    Attends meetings of clubs or organizations: Never    Relationship status: Married  Other Topics Concern   Not on file  Social History Narrative   Not on file    ECOG Status:   Review of Systems: A 12 point ROS discussed and pertinent positives are indicated in the HPI above.  All other systems are negative.  Review of Systems  Vital Signs: BP 135/67    Pulse 71    Temp 98.4 F (36.9 C) (Oral)    Resp (!) 25    Ht 5\' 9"  (1.753 m)  Wt 89.4 kg    SpO2 100%    BMI 29.09 kg/m   Physical Exam  Imaging: US Abdomen Complete  Result Date: 05/15/2019 CLINICAL DATA:  73 year old male with right upper quadrant abdominal pain. Cholecystectomy in 2019. EXAM: ABDOMEN ULTRASOUND COMPLETE COMPARISON:  Chest CT 05/10/2019. Ultrasound 07/24/2018. Lumbar MRI 01/15/2017. FINDINGS: Gallbladder: Surgically absent. Common bile duct: Diameter: 2 millimeters, normal. Liver: Around hypoechoic lesion in the right lobe on image 15 measures up to 20 millimeters diameter. 3 smaller round hypoechoic lesions are suggested in the left lobe (image 25) and central right lobe (image 26). These were not evident in 2019. Background liver echogenicity is within normal limits. Portal vein is patent on color Doppler imaging with normal direction of blood flow towards the liver. IVC: No abnormality visualized. Pancreas: Incompletely visualized due to overlying bowel gas, visualized portions within normal limits. Spleen: Size and appearance within normal limits. Right Kidney: Length: 11.8 centimeters. Chronic left renal lower pole cyst appears not significantly changed since 2018 and benign with no vascular elements (image 47). No right hydronephrosis. Left Kidney: Length: 12.8 centimeters. Stable small benign appearing midpole cyst since 2018, 19 millimeters. No left hydronephrosis. Abdominal aorta: No aneurysm visualized. Other findings: No free fluid. IMPRESSION:  1. Multiple new round hypoechoic liver lesions which were not evident on the 2019 ultrasound range from 8 mm to 20 mm diameter. These are nonspecific but suspicious for hepatic metastatic disease. Given that Chest CT was just recently performed on 05/10/2019, recommend follow-up CT Abdomen and Pelvis with oral and IV contrast. 2. Otherwise negative abdomen ultrasound status post cholecystectomy. These results will be called to the ordering clinician or representative by the Radiologist Assistant, and communication documented in the PACS or zVision Dashboard. Electronically Signed   By: Genevie Ann M.D.   On: 05/15/2019 14:02   Ct Abdomen Pelvis W Contrast  Result Date: 05/16/2019 CLINICAL DATA:  Heavy alcohol abuse. Abdominal pain for 10 days. Indeterminate hypoechoic liver lesions on recent sonogram. EXAM: CT ABDOMEN AND PELVIS WITH CONTRAST TECHNIQUE: Multidetector CT imaging of the abdomen and pelvis was performed using the standard protocol following bolus administration of intravenous contrast. CONTRAST:  172mL OMNIPAQUE IOHEXOL 300 MG/ML  SOLN COMPARISON:  05/15/2019 abdominal sonogram. FINDINGS: Lower chest: There is a 4.4 x 2.6 cm pleural based mass at the extreme medial basilar left lung base (series 2/image 12). A few scattered tiny solid pulmonary nodules at the right lung base, largest 4 mm in the right lower lobe (series 3/image 11). Coronary atherosclerosis. Visualized lower sternotomy wires are intact. Hepatobiliary: There are several (at least 8) ill-defined hypodense liver masses scattered throughout the liver, largest 3.1 x 2.7 cm in segment 6 right liver lobe (series 2/image 25) and 1.5 x 1.1 cm in the segment 7 right liver lobe (series 2/image 20). Cholecystectomy. No biliary ductal dilatation. Pancreas: There is a poorly marginated heterogeneous hypodense pancreatic mass at the uncinate process measuring approximately 2.9 x 2.7 cm (series 2/image 31). This mass abuts approximately 30-40% of the  circumference of the proximal SMA posteriorly (series 2/image 29). The mass encases a tributary of the SMV (series 2/image 33). No involvement by the mass of the portosplenic venous confluence or main portal vein. No pancreatic duct dilation. Spleen: Normal size. No mass. Adrenals/Urinary Tract: Normal adrenals. Simple bilateral renal cysts, largest 4.5 cm in the lower right kidney. Scattered subcentimeter hypodense renal cortical lesions in both kidneys are too small to characterize. No hydronephrosis. Normal bladder. Stomach/Bowel: Normal non-distended stomach.  Normal caliber small bowel with no small bowel wall thickening. Normal appendix. Normal large bowel with no diverticulosis, large bowel wall thickening or pericolonic fat stranding. Vascular/Lymphatic: Atherosclerotic nonaneurysmal abdominal aorta. Patent portal, splenic, hepatic and renal veins. A few mildly enlarged portacaval nodes, largest 1.4 cm (series 2/image 23). Mildly enlarged 1.0 cm aortocaval node (series 2/image 35). No pelvic adenopathy. Reproductive: Mildly enlarged prostate. Other: No pneumoperitoneum, ascites or focal fluid collection. Musculoskeletal: No aggressive appearing focal osseous lesions. Status post posterior spinal fixation bilaterally at L4-5. Moderate thoracolumbar spondylosis. IMPRESSION: 1. Poorly marginated heterogeneous hypodense 2.9 cm pancreatic mass at the uncinate process, worrisome for primary pancreatic adenocarcinoma. No biliary or pancreatic duct dilation. MRI abdomen without and with IV contrast is indicated for further evaluation. 2. Several ill-defined hypodense liver masses scattered throughout the liver, largest 3.1 cm in the segment 6 right liver lobe, worrisome for liver metastases. 3. Nonspecific portacaval and aortocaval adenopathy, worrisome for metastatic nodes. 4. 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 Additional tiny pulmonary nodules scattered at the  right lung base are indeterminate and should be reassessed on follow-up chest CT in 3 months. 5. Mild prostatomegaly. 6.  Aortic Atherosclerosis (ICD10-I70.0). These results will be called to the ordering clinician or representative by the Radiologist Assistant, and communication documented in the PACS or zVision Dashboard. Electronically Signed   By: Ilona Sorrel M.D.   On: 05/16/2019 15:13   Ct Chest Lung Cancer Screening Low Dose Wo Contrast  Result Date: 05/10/2019 CLINICAL DATA:  74 year old male with 49 pack-year history of smoking. Lung cancer screening. EXAM: CT CHEST WITHOUT CONTRAST LOW-DOSE FOR LUNG CANCER SCREENING TECHNIQUE: Multidetector CT imaging of the chest was performed following the standard protocol without IV contrast. COMPARISON:  None. FINDINGS: Cardiovascular: The heart size is normal. No substantial pericardial effusion. Status post CABG. Atherosclerotic calcification is noted in the wall of the thoracic aorta. Mediastinum/Nodes: No mediastinal lymphadenopathy. No evidence for gross hilar lymphadenopathy although assessment is limited by the lack of intravenous contrast on today's study. The esophagus has normal imaging features. There is no axillary lymphadenopathy. Lungs/Pleura: Numerous tiny pulmonary nodules are seen scattered in both lungs, measuring up to a maximum volume derived equivalent diameter of 4.4 mm. No overtly suspicious nodule or mass. No pleural effusion. Centrilobular emphsyema noted. Upper Abdomen: Unremarkable. Musculoskeletal: No worrisome lytic or sclerotic osseous abnormality. Centrilobular emphsyema noted. IMPRESSION: 1. Lung-RADS 2, benign appearance or behavior. Continue annual screening with low-dose chest CT without contrast in 12 months. 2.  Aortic Atherosclerois (ICD10-170.0) 3.  Emphysema. ([id number]) Electronically Signed   By: Misty Stanley M.D.   On: 05/10/2019 17:31    Labs:  CBC: Recent Labs    04/27/19 0940 05/15/19 1111 05/17/19 1212  05/21/19 0744  WBC 5.4 5.5 5.5 5.5  HGB 11.3* 11.2* 11.0* 10.1*  HCT 34.8* 32.7* 34.8* 32.5*  PLT 290 277 288 293    COAGS: Recent Labs    05/17/19 1212 05/21/19 0744  INR 1.0 0.9  APTT 29 27    BMP: Recent Labs    11/06/18 0903 03/12/19 1020 05/15/19 1111 05/17/19 1212  NA 138 138 139 140  K 4.2 4.4 4.7 4.4  CL 105 102 103 106  CO2 26 20 19* 24  GLUCOSE 124* 117* 111* 131*  BUN 15 16 23 23   CALCIUM 9.6 9.7 9.9 9.4  CREATININE 1.08 1.21 1.27 1.36*  GFRNONAA >60 59* 56* 52*  GFRAA >60 69 65 60*  LIVER FUNCTION TESTS: Recent Labs    11/06/18 0903 03/12/19 1020 05/15/19 1111 05/17/19 1212  BILITOT 0.4 0.3 <0.2 0.5  AST 16 19 21 31   ALT 13 15 18 30   ALKPHOS 47 77 71 67  PROT 8.1 7.2 6.8 8.2*  ALBUMIN 4.1 4.7 4.3 4.2    TUMOR MARKERS: No results for input(s): AFPTM, CEA, CA199, CHROMGRNA in the last 8760 hours.  Assessment and Plan:  US Liver Bx.  Thank you for this interesting consult.  I greatly enjoyed meeting Craig [name] and look forward to participating in their care.  A copy of this report was sent to the requesting provider on this date.  Electronically Signed: Register, Melanie Crazier, MD 05/21/2019, 8:32 AM   I spent a total o 15 min  in face to face in clinical consultation, greater than 50% of which was counseling/coordinating care for the pt.

## 2019-05-22 LAB — SURGICAL PATHOLOGY

## 2019-05-22 NOTE — Addendum Note (Signed)
Addended by: Earlie Server on: 05/22/2019 09:15 AM   Modules accepted: Orders

## 2019-05-22 NOTE — Progress Notes (Signed)
Bx result reviewed-called pt and he is doing fine this am-discussed with Dr.Yu-we will schedule Ct BX given difficulty of [place] BX.

## 2019-05-23 ENCOUNTER — Other Ambulatory Visit: Payer: Self-pay | Admitting: Radiology

## 2019-05-24 ENCOUNTER — Other Ambulatory Visit: Payer: Self-pay

## 2019-05-24 ENCOUNTER — Ambulatory Visit
Admission: RE | Admit: 2019-05-24 | Discharge: 2019-05-24 | Disposition: A | Discharge status: Home / Self Care | Payer: Medicare Other | Source: Ambulatory Visit | Attending: Oncology | Admitting: Oncology

## 2019-05-24 DIAGNOSIS — K219 Gastro-esophageal reflux disease without esophagitis: Secondary | ICD-10-CM | POA: Insufficient documentation

## 2019-05-24 DIAGNOSIS — I739 Peripheral vascular disease, unspecified: Secondary | ICD-10-CM | POA: Insufficient documentation

## 2019-05-24 DIAGNOSIS — E559 Vitamin D deficiency, unspecified: Secondary | ICD-10-CM | POA: Diagnosis not present

## 2019-05-24 DIAGNOSIS — Z95820 Peripheral vascular angioplasty status with implants and grafts: Secondary | ICD-10-CM | POA: Diagnosis not present

## 2019-05-24 DIAGNOSIS — Z825 Family history of asthma and other chronic lower respiratory diseases: Secondary | ICD-10-CM | POA: Insufficient documentation

## 2019-05-24 DIAGNOSIS — C787 Secondary malignant neoplasm of liver and intrahepatic bile duct: Secondary | ICD-10-CM | POA: Diagnosis not present

## 2019-05-24 DIAGNOSIS — I509 Heart failure, unspecified: Secondary | ICD-10-CM | POA: Diagnosis not present

## 2019-05-24 DIAGNOSIS — E785 Hyperlipidemia, unspecified: Secondary | ICD-10-CM | POA: Insufficient documentation

## 2019-05-24 DIAGNOSIS — I2510 Atherosclerotic heart disease of native coronary artery without angina pectoris: Secondary | ICD-10-CM | POA: Diagnosis not present

## 2019-05-24 DIAGNOSIS — Z888 Allergy status to other drugs, medicaments and biological substances status: Secondary | ICD-10-CM | POA: Diagnosis not present

## 2019-05-24 DIAGNOSIS — K8689 Other specified diseases of pancreas: Secondary | ICD-10-CM | POA: Diagnosis present

## 2019-05-24 DIAGNOSIS — J948 Other specified pleural conditions: Secondary | ICD-10-CM | POA: Insufficient documentation

## 2019-05-24 DIAGNOSIS — Z79899 Other long term (current) drug therapy: Secondary | ICD-10-CM | POA: Diagnosis not present

## 2019-05-24 DIAGNOSIS — Z7982 Long term (current) use of aspirin: Secondary | ICD-10-CM | POA: Insufficient documentation

## 2019-05-24 DIAGNOSIS — R160 Hepatomegaly, not elsewhere classified: Secondary | ICD-10-CM | POA: Diagnosis not present

## 2019-05-24 DIAGNOSIS — Z951 Presence of aortocoronary bypass graft: Secondary | ICD-10-CM | POA: Insufficient documentation

## 2019-05-24 DIAGNOSIS — Z981 Arthrodesis status: Secondary | ICD-10-CM | POA: Diagnosis not present

## 2019-05-24 DIAGNOSIS — I110 Hypertensive heart disease with heart failure: Secondary | ICD-10-CM | POA: Diagnosis not present

## 2019-05-24 DIAGNOSIS — C801 Malignant (primary) neoplasm, unspecified: Secondary | ICD-10-CM | POA: Diagnosis not present

## 2019-05-24 DIAGNOSIS — Z87891 Personal history of nicotine dependence: Secondary | ICD-10-CM | POA: Diagnosis not present

## 2019-05-24 MED ORDER — MIDAZOLAM HCL 2 MG/2ML IJ SOLN
INTRAMUSCULAR | Status: AC | PRN
  Administered 2019-05-24: 2 mg via INTRAVENOUS
  Administered 2019-05-24: 1 mg via INTRAVENOUS

## 2019-05-24 MED ORDER — MIDAZOLAM HCL 5 MG/5ML IJ SOLN
INTRAMUSCULAR | Status: AC
  Filled 2019-05-24: qty 5

## 2019-05-24 MED ORDER — FENTANYL CITRATE (PF) 100 MCG/2ML IJ SOLN
INTRAMUSCULAR | Status: AC
  Filled 2019-05-24: qty 2

## 2019-05-24 MED ORDER — SODIUM CHLORIDE 0.9 % IV SOLN
INTRAVENOUS | Status: DC
  Administered 2019-05-24: 10:00:00 via INTRAVENOUS

## 2019-05-24 MED ORDER — FENTANYL CITRATE (PF) 100 MCG/2ML IJ SOLN
INTRAMUSCULAR | Status: AC | PRN
  Administered 2019-05-24: 25 ug via INTRAVENOUS
  Administered 2019-05-24: 50 ug via INTRAVENOUS

## 2019-05-24 NOTE — Progress Notes (Signed)
Patient remains clinically stable post Liver biopsy per Dr Register, vitals have remained stable. Ate lunch, tolerated well, wife here earlier, with Dr Register talking with patient and wife with questions answered regarding procedure. Discharge instructions given to wife and patient with questions answered. Patient has remained stable without complaints since biopsy. Plan for discharge at 1500 if remains stable.

## 2019-05-24 NOTE — H&P (Signed)
Chief Complaint: Patient was seen in consultation today for No chief complaint on file.  at the request of Yu,Zhou  Referring Physician(s): Yu,Zhou  Supervising Physician:Register  Patient Status: Liver Bx  History of Present Illness: Craig [name] is a 73 y.o. male with probable liver mets in for repeat liver bx. Plan   CT approach this time given difficulty with ultrasound directed approach.Pt stable since prior visit.  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   LIMA to LAD SVG to DISTAL CIRCUMFLEX SVG SEQUENTIALLY to OM1 and OM2 SVG to ACUTE MARGINAL   Spinal stenosis of cervical region    Vitamin D deficiency     Past Surgical History:  Procedure Laterality Date   BACK SURGERY  2018    fusion with screws   CHOLECYSTECTOMY N/A 11/13/2018   Procedure: LAPAROSCOPIC CHOLECYSTECTOMY;  Surgeon: Vickie Epley, MD;  Location: [facility];  Service: General;  Laterality: N/A;   COLONOSCOPY     CORONARY ARTERY BYPASS GRAFT N/A 09/30/2017   Procedure: CORONARY ARTERY BYPASS GRAFTING times five using right and left Saphaneous vein harvested endoscopicly  and left internal mammary artery. (CABG),TEE;  Surgeon: Rexene Alberts, MD;  Location: [facility];  Service: Open Heart Surgery;  Laterality: N/A;   LEFT HEART CATH AND CORONARY ANGIOGRAPHY Left 09/06/2017   Procedure: LEFT HEART CATH AND CORONARY ANGIOGRAPHY;  Surgeon: Corey Skains, MD;  Location: [facility];  Service: Cardiovascular;  Laterality: Left;    percutaneous transluminal balloon angioplasty  01/2010   of left lower extremity   TEE WITHOUT CARDIOVERSION N/A 09/30/2017   Procedure: TRANSESOPHAGEAL ECHOCARDIOGRAM (TEE);  Surgeon: Rexene Alberts, MD;  Location: [place];  Service: Open Heart Surgery;  Laterality: N/A;   VASCULAR SURGERY Left 2010   left exernal iliac and superficial femoral artery PTCA and stenting    Allergies: Lipitor [atorvastatin], Zetia [ezetimibe], Lisinopril, Protonix [pantoprazole], and Spironolactone  Medications: Prior to Admission medications   Medication Sig Start Date End Date Taking? Authorizing Provider  acetaminophen (TYLENOL) 500 MG tablet Take 2 tablets (1,000 mg total) by mouth every 6 (six) hours. Patient taking differently: Take 1,000 mg by mouth every 6 (six) hours as needed for moderate pain.  10/05/17  Yes [name], PA-C  aspirin EC 81 MG tablet Take 81 mg by mouth daily.   Yes [provider]  carvedilol (COREG) 6.25 MG tablet Take 6.25 mg by mouth 2 (two) times daily with a meal. 03/01/18  Yes [provider]  diltiazem (CARDIZEM CD) 300 MG 24 hr capsule Take 1 capsule (300 mg total) by mouth daily. 01/22/19  Yes Fenton Malling M, PA-C  dorzolamide-timolol (COSOPT) 22.3-6.8 MG/ML ophthalmic solution INSTILL ONE DROP INTO BOTH EYES TWICE DAILY 11/08/18  Yes [provider]  ferrous sulfate 325 (65 FE) MG tablet Take 325 mg by mouth daily with breakfast.   Yes [provider]  gabapentin (NEURONTIN) 300 MG capsule  Take 2 capsules (600 mg total) by mouth 3 (three) times daily. 04/20/19  Yes Carles Collet M, PA-C  hydrochlorothiazide (HYDRODIURIL) 25 MG tablet Take 1 tablet (25 mg total) by mouth daily. 08/24/18  Yes Chauvin, Herbie [place], PA  latanoprost (XALATAN) 0.005 % ophthalmic solution Place 1 drop into both eyes at bedtime.  07/15/18  Yes [provider]  Multiple Vitamin (MULTIVITAMIN WITH MINERALS) TABS tablet Take 1 tablet by mouth daily.    Yes [provider]  OMEGA-3 FATTY ACIDS PO Take 1,200 mg by mouth daily.    Yes [provider]  omeprazole (PRILOSEC) 40 MG capsule TAKE 1 CAPSULE BY MOUTH EVERY DAY 05/17/19  Yes Carles Collet M, PA-C  rosuvastatin (CRESTOR) 10 MG tablet Take 1 tablet (10 mg total) by mouth daily. 04/27/19  Yes Carles Collet M, PA-C  sucralfate (CARAFATE) 1 g tablet Take 1 tablet (1 g total) by mouth 4 (four) times daily -  with meals and at bedtime. 05/15/19  Yes Carles Collet M, PA-C  triamcinolone cream (KENALOG) 0.1 % APPLY 1 APPLICATION TOPICALLY 2 (TWO) TIMES DAILY. TO EXTERNAL EAR 11/10/18  Yes Carmon Ginsberg, PA  valsartan (DIOVAN) 160 MG tablet Take 1 tablet (160 mg total) by mouth daily. 03/09/19  Yes Chrismon, Vickki Muff, PA     Family History  Problem Relation Age of Onset   Brain cancer Mother    Emphysema Father    Cancer Brother     Social History   Socioeconomic History   Marital status: Married    Spouse name: Enid Derry   Number of children: 2   Years of education: Not on file   Highest education level: Not on file  Occupational History   Occupation: drove tractors    Comment: retired  Scientist, product/process development strain: Not hard at [employer] insecurity    Worry: Never true    Inability: Never true   Transportation needs    Medical: No    Non-medical: No  Tobacco Use   Smoking status: Former Smoker    Packs/day: 0.75    Years: 50.00    Pack years: 37.50    Types: Cigarettes    Quit date: 09/27/2017    Years since quitting: 1.6   Smokeless tobacco: Former Systems developer    Types: Chew  Substance and Sexual Activity   Alcohol use: Not Currently    Comment: stopped drinking before cabg   Drug use: Not Currently    Types: Marijuana    Comment: stopped smoking before CABG   Sexual activity: Not Currently  Lifestyle   Physical activity    Days per week: 5 days    Minutes per session: 150+ min   Stress: Not at all  Relationships    Social connections    Talks on phone: More than three times a week    Gets together: More than three times a week    Attends religious service: Never    Active member of club or organization: No    Attends meetings of clubs or organizations: Never    Relationship status: Married  Other Topics Concern   Not on file  Social History Narrative   Not on file      Review of Systems: A 12 point ROS discussed and pertinent positives are indicated in the HPI above.  All other systems are negative.  Review of Systems  Vital Signs: BP 121/67 (BP Location: Left Arm)    Pulse 65  Temp 98.9 F (37.2 C) (Oral)    Resp 14    Ht 5\' 9"  (1.753 m)    Wt 84.8 kg    SpO2 100%    BMI 27.62 kg/m   Physical Exam  Pt alert,Ox3.VSSS HENT-clear Chest-clear,RRR. Abd-benign  Imaging: US Abdomen Complete  Result Date: 05/15/2019 CLINICAL DATA:  73 year old male with right upper quadrant abdominal pain. Cholecystectomy in 2019. EXAM: ABDOMEN ULTRASOUND COMPLETE COMPARISON:  Chest CT 05/10/2019. Ultrasound 07/24/2018. Lumbar MRI 01/15/2017. FINDINGS: Gallbladder: Surgically absent. Common bile duct: Diameter: 2 millimeters, normal. Liver: Around hypoechoic lesion in the right lobe on image 15 measures up to 20 millimeters diameter. 3 smaller round hypoechoic lesions are suggested in the left lobe (image 25) and central right lobe (image 26). These were not evident in 2019. Background liver echogenicity is within normal limits. Portal vein is patent on color Doppler imaging with normal direction of blood flow towards the liver. IVC: No abnormality visualized. Pancreas: Incompletely visualized due to overlying bowel gas, visualized portions within normal limits. Spleen: Size and appearance within normal limits. Right Kidney: Length: 11.8 centimeters. Chronic left renal lower pole cyst appears not significantly changed since 2018 and benign with no vascular elements (image 47). No right hydronephrosis. Left Kidney:  Length: 12.8 centimeters. Stable small benign appearing midpole cyst since 2018, 19 millimeters. No left hydronephrosis. Abdominal aorta: No aneurysm visualized. Other findings: No free fluid. IMPRESSION: 1. Multiple new round hypoechoic liver lesions which were not evident on the 2019 ultrasound range from 8 mm to 20 mm diameter. These are nonspecific but suspicious for hepatic metastatic disease. Given that Chest CT was just recently performed on 05/10/2019, recommend follow-up CT Abdomen and Pelvis with oral and IV contrast. 2. Otherwise negative abdomen ultrasound status post cholecystectomy. These results will be called to the ordering clinician or representative by the Radiologist Assistant, and communication documented in the PACS or zVision Dashboard. Electronically Signed   By: Genevie Ann M.D.   On: 05/15/2019 14:02   Ct Abdomen Pelvis W Contrast  Result Date: 05/16/2019 CLINICAL DATA:  Heavy alcohol abuse. Abdominal pain for 10 days. Indeterminate hypoechoic liver lesions on recent sonogram. EXAM: CT ABDOMEN AND PELVIS WITH CONTRAST TECHNIQUE: Multidetector CT imaging of the abdomen and pelvis was performed using the standard protocol following bolus administration of intravenous contrast. CONTRAST:  132mL OMNIPAQUE IOHEXOL 300 MG/ML  SOLN COMPARISON:  05/15/2019 abdominal sonogram. FINDINGS: Lower chest: There is a 4.4 x 2.6 cm pleural based mass at the extreme medial basilar left lung base (series 2/image 12). A few scattered tiny solid pulmonary nodules at the right lung base, largest 4 mm in the right lower lobe (series 3/image 11). Coronary atherosclerosis. Visualized lower sternotomy wires are intact. Hepatobiliary: There are several (at least 8) ill-defined hypodense liver masses scattered throughout the liver, largest 3.1 x 2.7 cm in segment 6 right liver lobe (series 2/image 25) and 1.5 x 1.1 cm in the segment 7 right liver lobe (series 2/image 20). Cholecystectomy. No biliary ductal dilatation.  Pancreas: There is a poorly marginated heterogeneous hypodense pancreatic mass at the uncinate process measuring approximately 2.9 x 2.7 cm (series 2/image 31). This mass abuts approximately 30-40% of the circumference of the proximal SMA posteriorly (series 2/image 29). The mass encases a tributary of the SMV (series 2/image 33). No involvement by the mass of the portosplenic venous confluence or main portal vein. No pancreatic duct dilation. Spleen: Normal size. No mass. Adrenals/Urinary Tract: Normal adrenals. Simple bilateral renal cysts, largest 4.5  cm in the lower right kidney. Scattered subcentimeter hypodense renal cortical lesions in both kidneys are too small to characterize. No hydronephrosis. Normal bladder. Stomach/Bowel: Normal non-distended stomach. Normal caliber small bowel with no small bowel wall thickening. Normal appendix. Normal large bowel with no diverticulosis, large bowel wall thickening or pericolonic fat stranding. Vascular/Lymphatic: Atherosclerotic nonaneurysmal abdominal aorta. Patent portal, splenic, hepatic and renal veins. A few mildly enlarged portacaval nodes, largest 1.4 cm (series 2/image 23). Mildly enlarged 1.0 cm aortocaval node (series 2/image 35). No pelvic adenopathy. Reproductive: Mildly enlarged prostate. Other: No pneumoperitoneum, ascites or focal fluid collection. Musculoskeletal: No aggressive appearing focal osseous lesions. Status post posterior spinal fixation bilaterally at L4-5. Moderate thoracolumbar spondylosis. IMPRESSION: 1. Poorly marginated heterogeneous hypodense 2.9 cm pancreatic mass at the uncinate process, worrisome for primary pancreatic adenocarcinoma. No biliary or pancreatic duct dilation. MRI abdomen without and with IV contrast is indicated for further evaluation. 2. Several ill-defined hypodense liver masses scattered throughout the liver, largest 3.1 cm in the segment 6 right liver lobe, worrisome for liver metastases. 3. Nonspecific  portacaval and aortocaval adenopathy, worrisome for metastatic nodes. 4. 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 Additional tiny pulmonary nodules scattered at the right lung base are indeterminate and should be reassessed on follow-up chest CT in 3 months. 5. Mild prostatomegaly. 6.  Aortic Atherosclerosis (ICD10-I70.0). These results will be called to the ordering clinician or representative by the Radiologist Assistant, and communication documented in the PACS or zVision Dashboard. Electronically Signed   By: Ilona Sorrel M.D.   On: 05/16/2019 15:13   US Biopsy (liver)  Result Date: 05/21/2019 INDICATION: Multiple liver lesions. EXAM: ULTRASOUND DIRECTED LIVER BIOPSY. MEDICATIONS: None. ANESTHESIA/SEDATION: Moderate (conscious) sedation was employed during this procedure. A total of Versed 4.5 mg and Fentanyl 100 mcg was administered intravenously. Moderate Sedation Time: 37 minutes. The patient's level of consciousness and vital signs were monitored continuously by radiology nursing throughout the procedure under my direct supervision. FLUOROSCOPY TIME:  Fluoroscopy Time: 0 COMPLICATIONS: None immediate. PROCEDURE: After discussing the risks and benefits of this procedure with the patient informed consent was obtained. Abdomen sterilely prepped and draped. Local anesthesia administered 1% lidocaine. IV conscious sedation performed as above. Ultrasound liver biopsy was performed with a 18 gauge needle. Two good samples obtained. No complications. Post procedure patient stable. Discharge instructions given. Follow-up with patient's physician. IMPRESSION: Successful ultrasound directed liver mass biopsy. Electronically Signed   By: Marcello Moores  Register   On: 05/21/2019 09:55   Ct Chest Lung Cancer Screening Low Dose Wo Contrast  Result Date: 05/10/2019 CLINICAL DATA:  73 year old male with 38 pack-year history of smoking. Lung cancer screening. EXAM: CT CHEST  WITHOUT CONTRAST LOW-DOSE FOR LUNG CANCER SCREENING TECHNIQUE: Multidetector CT imaging of the chest was performed following the standard protocol without IV contrast. COMPARISON:  None. FINDINGS: Cardiovascular: The heart size is normal. No substantial pericardial effusion. Status post CABG. Atherosclerotic calcification is noted in the wall of the thoracic aorta. Mediastinum/Nodes: No mediastinal lymphadenopathy. No evidence for gross hilar lymphadenopathy although assessment is limited by the lack of intravenous contrast on today's study. The esophagus has normal imaging features. There is no axillary lymphadenopathy. Lungs/Pleura: Numerous tiny pulmonary nodules are seen scattered in both lungs, measuring up to a maximum volume derived equivalent diameter of 4.4 mm. No overtly suspicious nodule or mass. No pleural effusion. Centrilobular emphsyema noted. Upper Abdomen: Unremarkable. Musculoskeletal: No worrisome lytic or sclerotic osseous abnormality. Centrilobular emphsyema noted.  IMPRESSION: 1. Lung-RADS 2, benign appearance or behavior. Continue annual screening with low-dose chest CT without contrast in 12 months. 2.  Aortic Atherosclerois (ICD10-170.0) 3.  Emphysema. (TRV20-E33.9) Electronically Signed   By: Misty Stanley M.D.   On: 05/10/2019 17:31    Labs:  CBC: Recent Labs    04/27/19 0940 05/15/19 1111 05/17/19 1212 05/21/19 0744  WBC 5.4 5.5 5.5 5.5  HGB 11.3* 11.2* 11.0* 10.1*  HCT 34.8* 32.7* 34.8* 32.5*  PLT 290 277 288 293    COAGS: Recent Labs    05/17/19 1212 05/21/19 0744  INR 1.0 0.9  APTT 29 27    BMP: Recent Labs    11/06/18 0903 03/12/19 1020 05/15/19 1111 05/17/19 1212  NA 138 138 139 140  K 4.2 4.4 4.7 4.4  CL 105 102 103 106  CO2 26 20 19* 24  GLUCOSE 124* 117* 111* 131*  BUN 15 16 23 23   CALCIUM 9.6 9.7 9.9 9.4  CREATININE 1.08 1.21 1.27 1.36*  GFRNONAA >60 59* 56* 52*  GFRAA >60 69 65 60*    LIVER FUNCTION TESTS: Recent Labs     11/06/18 0903 03/12/19 1020 05/15/19 1111 05/17/19 1212  BILITOT 0.4 0.3 <0.2 0.5  AST 16 19 21 31   ALT 13 15 18 30   ALKPHOS 47 77 71 67  PROT 8.1 7.2 6.8 8.2*  ALBUMIN 4.1 4.7 4.3 4.2    TUMOR MARKERS: No results for input(s): AFPTM, CEA, CA199, CHROMGRNA in the last 8760 hours.  Assessment and Plan:Probable liver mets.Plan CT Bx.    Thank you for this interesting consult.  I greatly enjoyed meeting Dierks Wach and look forward to participating in their care.  A copy of this report was sent to the requesting provider on this date.  Electronically Signed: Register, Melanie Crazier, MD 05/24/2019, 11:40 AM   I spent a total of 20 min n face to face in clinical consultation, greater than 50% of which was counseling/coordinating care for liver bx.

## 2019-05-24 NOTE — Progress Notes (Unsigned)
This patient has been moved to next week for discussion

## 2019-05-24 NOTE — Discharge Instructions (Signed)
Moderate Conscious Sedation, Adult, Care After These instructions provide you with information about caring for yourself after your procedure. Your health care provider may also give you more specific instructions. Your treatment has been planned according to current medical practices, but problems sometimes occur. Call your health care provider if you have any problems or questions after your procedure. What can I expect after the procedure? After your procedure, it is common:  To feel sleepy for several hours.  To feel clumsy and have poor balance for several hours.  To have poor judgment for several hours.  To vomit if you eat too soon. Follow these instructions at home: For at least 24 hours after the procedure:   Do not: ? Participate in activities where you could fall or become injured. ? Drive. ? Use heavy machinery. ? Drink alcohol. ? Take sleeping pills or medicines that cause drowsiness. ? Make important decisions or sign legal documents. ? Take care of children on your own.  Rest. Eating and drinking  Follow the diet recommended by your health care provider.  If you vomit: ? Drink water, juice, or soup when you can drink without vomiting. ? Make sure you have little or no nausea before eating solid foods. General instructions  Have a responsible adult stay with you until you are awake and alert.  Take over-the-counter and prescription medicines only as told by your health care provider.  If you smoke, do not smoke without supervision.  Keep all follow-up visits as told by your health care provider. This is important. Contact a health care provider if:  You keep feeling nauseous or you keep vomiting.  You feel light-headed.  You develop a rash.  You have a fever. Get help right away if:  You have trouble breathing. This information is not intended to replace advice given to you by your health care provider. Make sure you discuss any questions you have  with your health care provider. Document Released: 07/18/2013 Document Revised: 09/09/2017 Document Reviewed: 01/17/2016 Elsevier Patient Education  2020 [employer]. Liver Biopsy, Care After These instructions give you information on caring for yourself after your procedure. Your doctor may also give you more specific instructions. Call your doctor if you have any problems or questions after your procedure. What can I expect after the procedure? After the procedure, it is common to have:  Pain and soreness where the biopsy was done.  Bruising around the area where the biopsy was done.  Sleepiness and be tired for a few days. Follow these instructions at home: Medicines  Take over-the-counter and prescription medicines only as told by your doctor.  If you were prescribed an antibiotic medicine, take it as told by your doctor. Do not stop taking the antibiotic even if you start to feel better.  Do not take medicines such as aspirin and ibuprofen. These medicines can thin your blood. Do not take these medicines unless your doctor tells you to take them.  If you are taking prescription pain medicine, take actions to prevent or treat constipation. Your doctor may recommend that you: ? Drink enough fluid to keep your pee (urine) clear or pale yellow. ? Take over-the-counter or prescription medicines. ? Eat foods that are high in fiber, such as fresh fruits and vegetables, whole grains, and beans. ? Limit foods that are high in fat and processed sugars, such as fried and sweet foods. Caring for your cut  Follow instructions from your doctor about how to take care of your cuts  from surgery (incisions). Make sure you: ? Wash your hands with soap and water before you change your bandage (dressing). If you cannot use soap and water, use hand sanitizer. ? Change your bandage as told by your doctor. ? Leave stitches (sutures), skin glue, or skin tape (adhesive) strips in place. They may need to  stay in place for 2 weeks or longer. If tape strips get loose and curl up, you may trim the loose edges. Do not remove tape strips completely unless your doctor says it is okay.  Check your cuts every day for signs of infection. Check for: ? Redness, swelling, or more pain. ? Fluid or blood. ? Pus or a bad smell. ? Warmth.  Do not take baths, swim, or use a hot tub until your doctor says it is okay to do so. Activity   Rest at home for 1-2 days or as told by your doctor. ? Avoid sitting for a long time without moving. Get up to take short walks every 1-2 hours.  Return to your normal activities as told by your doctor. Ask what activities are safe for you.  Do not do these things in the first 24 hours: ? Drive. ? Use machinery. ? Take a bath or shower.  Do not lift more than 10 pounds (4.5 kg) or play contact sports for the first 2 weeks. General instructions   Do not drink alcohol in the first week after the procedure.  Have someone stay with you for at least 24 hours after the procedure.  Get your test results. Ask your doctor or the department that is doing the test: ? When will my results be ready? ? How will I get my results? ? What are my treatment options? ? What other tests do I need? ? What are my next steps?  Keep all follow-up visits as told by your doctor. This is important. Contact a doctor if:  A cut bleeds and leaves more than just a small spot of blood.  A cut is red, puffs up (swells), or hurts more than before.  Fluid or something else comes from a cut.  A cut smells bad.  You have a fever or chills. Get help right away if:  You have swelling, bloating, or pain in your belly (abdomen).  You get dizzy or faint.  You have a rash.  You feel sick to your stomach (nauseous) or throw up (vomit).  You have trouble breathing, feel short of breath, or feel faint.  Your chest hurts.  You have problems talking or seeing.  You have trouble with  your balance or moving your arms or legs. Summary  After the procedure, it is common to have pain, soreness, bruising, and tiredness.  Your doctor will tell you how to take care of yourself at home. Change your bandage, take your medicines, and limit your activities as told by your doctor.  Call your doctor if you have symptoms of infection. Get help right away if your belly swells, your cut bleeds a lot, or you have trouble talking or breathing. This information is not intended to replace advice given to you by your health care provider. Make sure you discuss any questions you have with your health care provider. Document Released: 07/06/2008 Document Revised: 10/07/2017 Document Reviewed: 10/07/2017 Elsevier Patient Education  2020 [employer].

## 2019-05-28 ENCOUNTER — Other Ambulatory Visit: Payer: Self-pay

## 2019-05-28 ENCOUNTER — Encounter: Payer: Self-pay | Admitting: Oncology

## 2019-05-28 ENCOUNTER — Inpatient Hospital Stay: Payer: Medicare Other | Admitting: Oncology

## 2019-05-28 VITALS — BP 149/63 | HR 66 | Temp 96.6°F | Wt 194.5 lb

## 2019-05-28 DIAGNOSIS — K8689 Other specified diseases of pancreas: Secondary | ICD-10-CM

## 2019-05-28 DIAGNOSIS — R7989 Other specified abnormal findings of blood chemistry: Secondary | ICD-10-CM

## 2019-05-28 DIAGNOSIS — Z5111 Encounter for antineoplastic chemotherapy: Secondary | ICD-10-CM | POA: Diagnosis not present

## 2019-05-28 DIAGNOSIS — C787 Secondary malignant neoplasm of liver and intrahepatic bile duct: Secondary | ICD-10-CM

## 2019-05-28 NOTE — Progress Notes (Signed)
Hematology/Oncology  Follow up note [facility] [phone] [phone]   Patient Care Team: Paulene Floor as PCP - General (Physician Assistant) Clent Jacks, RN as Oncology Nurse Navigator  REFERRING PROVIDER: Trinna Post, PA-C  CHIEF COMPLAINTS/REASON FOR VISIT:  Evaluation of abnormal CT  HISTORY OF PRESENTING ILLNESS:   Craig [name] is a  73 y.o.  male with PMH listed below, including CABG x5, PVD, hypertension, former tobacco abuse, former alcohol abuse, iron deficiency anemia, was seen in consultation at the request of  Terrilee Croak, Adriana M, PA-C  for evaluation of abnormal CT Patient recently presented to primary care provider complaining for abdominal pain radiating to his back for about 10 days.  Patient uses Tylenol as needed for pain. Work-up showed elevated amylase, lipase, mild anemia with hemoglobin of 11.3 Acute hepatitis panel negative. 05/16/2019 CT abdomen pelvis with contrast showed poorly marginated heterogeneous hypodense 2.9 cm pancreatic mass at the uncinate process, worrisome for primary pancreatic cancer.  No biliary or pancreatic duct dilation. Several ill defined hypodense liver masses scattered throughout the liver, largest 3.1 cm in the segment 6 right liver lobe. Nonspecific portacaval and aortocaval adenopathy. Extreme medial left lung base 4.4 cm pleural-based mass probably a pleural metastasis.  Additional tiny pulmonary nodules scattered at the right lung base are indeterminate. Mild prostate or megaly  Patient was referred to heme-onc for further evaluation. Patient reports that his abdominal pain was 10 out of 10 a few days ago, he uses Tylenol as needed. Today his pain is getting better, 2 out of 10.  Denies any nausea, vomiting, diarrhea, fever or chills. Married, lives with wife.  Appetite is good.  He weighs 195 pounds today at the clinic. His weight was 204 pounds back in May  2020.  He had a history of 37.5-pack-year smoking history quitting 2018. No longer drinking alcohol.  INTERVAL HISTORY Craig [name] is a 73 y.o. male who has above history reviewed by me today presents for follow up visit for management of abnormal CT scan Problems and complaints are listed below: During the interval patient had ultrasound-guided liver biopsy on 05/21/2019.  Pathology showed fragments of benign hepatic parenchyma showing minimal macro vascular steatosis, otherwise no significant histo pathologic change. Single core of renal tissue showing approximately 25% glomerulosclerosis Patient denies any pain today.  Patient underwent CT-guided liver biopsy on 05/24/2019.  Pathology results pending. #History of CAD, status post CABG x5.  09/27/2017 echocardiogram showed LVEF 45 to 50%  Review of Systems  Constitutional: Positive for fatigue and unexpected weight change. Negative for appetite change, chills and fever.  HENT:   Negative for hearing loss and voice change.   Eyes: Negative for eye problems and icterus.  Respiratory: Negative for chest tightness, cough and shortness of breath.   Cardiovascular: Negative for chest pain and leg swelling.  Gastrointestinal: Negative for abdominal distention and abdominal pain.  Endocrine: Negative for hot flashes.  Genitourinary: Negative for difficulty urinating, dysuria and frequency.   Musculoskeletal: Negative for arthralgias.  Skin: Negative for itching and rash.  Neurological: Negative for light-headedness and numbness.  Hematological: Negative for adenopathy. Does not bruise/bleed easily.  Psychiatric/Behavioral: Negative for confusion.    MEDICAL HISTORY:  Past Medical History:  Diagnosis Date   Allergic rhinitis    CHF (congestive heart failure) (HCC)    Chronic cholecystitis    Coronary artery disease    DDD (degenerative disc disease), cervical    Dyspnea    Early cataract  Erectile dysfunction    GERD  (gastroesophageal reflux disease)    Gouty arthritis    Headache    occasional migraines   Hypercalcemia    Hyperlipemia    Hypertension    Palpitations    Peripheral vascular disease (HCC)    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   LIMA to LAD SVG to [place] SVG SEQUENTIALLY to OM1 and OM2 SVG to ACUTE MARGINAL   Spinal stenosis of cervical region    Vitamin D deficiency     SURGICAL HISTORY: Past Surgical History:  Procedure Laterality Date   BACK SURGERY  2018    fusion with screws   CHOLECYSTECTOMY N/A 11/13/2018   Procedure: LAPAROSCOPIC CHOLECYSTECTOMY;  Surgeon: Vickie Epley, MD;  Location: [facility];  Service: General;  Laterality: N/A;   COLONOSCOPY     CORONARY ARTERY BYPASS GRAFT N/A 09/30/2017   Procedure: CORONARY ARTERY BYPASS GRAFTING times five using right and left Saphaneous vein harvested endoscopicly  and left internal mammary artery. (CABG),TEE;  Surgeon: Rexene Alberts, MD;  Location: [place];  Service: Open Heart Surgery;  Laterality: N/A;   LEFT HEART CATH AND CORONARY ANGIOGRAPHY Left 09/06/2017   Procedure: LEFT HEART CATH AND CORONARY ANGIOGRAPHY;  Surgeon: Corey Skains, MD;  Location: [place] [facility];  Service: Cardiovascular;  Laterality: Left;   percutaneous transluminal balloon angioplasty  01/2010   of left lower extremity   TEE WITHOUT CARDIOVERSION N/A 09/30/2017   Procedure: TRANSESOPHAGEAL ECHOCARDIOGRAM (TEE);  Surgeon: Rexene Alberts, MD;  Location: [place];  Service: Open Heart Surgery;  Laterality: N/A;   VASCULAR SURGERY Left 2010   left exernal iliac and superficial femoral artery PTCA and stenting    SOCIAL HISTORY: Social History   Socioeconomic History   Marital status: Married    Spouse name: Enid Derry   Number of children: 2   Years of education: Not on file   Highest education level: Not on file  Occupational History   Occupation: drove tractors    Comment: retired  Armed forces operational officer strain: Not hard at [employer] insecurity    Worry: Never true    Inability: Never true   Transportation needs    Medical: No    Non-medical: No  Tobacco Use   Smoking status: Former Smoker    Packs/day: 0.75    Years: 50.00    Pack years: 37.50    Types: Cigarettes    Quit date: 09/27/2017    Years since quitting: 1.6   Smokeless tobacco: Former Systems developer    Types: Chew  Substance and Sexual Activity   Alcohol use: Not Currently    Comment: stopped drinking before cabg   Drug use: Not Currently    Types: Marijuana    Comment: stopped smoking before CABG   Sexual activity: Not Currently  Lifestyle   Physical activity    Days per week: 5 days    Minutes per session: 150+ min   Stress: Not at all  Relationships   Social connections    Talks on phone: More than three times a week    Gets together: More than three times a week    Attends religious service: Never    Active member of club or organization: No    Attends meetings of clubs or organizations: Never    Relationship status: Married   Intimate partner violence    Fear of current or ex partner: Not on file  Emotionally abused: Not on file    Physically abused: Not on file    Forced sexual activity: Not on file  Other Topics Concern   Not on file  Social History Narrative   Not on file    FAMILY HISTORY: Family History  Problem Relation Age of Onset   Brain cancer Mother    Emphysema Father    Cancer Brother     ALLERGIES:  is allergic to lipitor [atorvastatin]; zetia [ezetimibe]; lisinopril; protonix [pantoprazole]; and spironolactone.  MEDICATIONS:  Current Outpatient Medications  Medication Sig Dispense Refill   acetaminophen (TYLENOL) 500 MG tablet Take 2 tablets (1,000 mg total) by mouth every 6 (six) hours. (Patient taking differently: Take 1,000 mg by mouth every 6 (six) hours as needed for moderate pain. ) 30 tablet 0   aspirin EC 81 MG tablet Take 81 mg by mouth daily.       carvedilol (COREG) 6.25 MG tablet Take 6.25 mg by mouth 2 (two) times daily with a meal.  3   diltiazem (CARDIZEM CD) 300 MG 24 hr capsule Take 1 capsule (300 mg total) by mouth daily. 90 capsule 1   dorzolamide-timolol (COSOPT) 22.3-6.8 MG/ML ophthalmic solution INSTILL ONE DROP INTO BOTH EYES TWICE DAILY     ferrous sulfate 325 (65 FE) MG tablet Take 325 mg by mouth daily with breakfast.     gabapentin (NEURONTIN) 300 MG capsule Take 2 capsules (600 mg total) by mouth 3 (three) times daily. 540 capsule 0   hydrochlorothiazide (HYDRODIURIL) 25 MG tablet Take 1 tablet (25 mg total) by mouth daily. 90 tablet 3   latanoprost (XALATAN) 0.005 % ophthalmic solution Place 1 drop into both eyes at bedtime.   3   Multiple Vitamin (MULTIVITAMIN WITH MINERALS) TABS tablet Take 1 tablet by mouth daily.     OMEGA-3 FATTY ACIDS PO Take 1,200 mg by mouth daily.      omeprazole (PRILOSEC) 40 MG capsule TAKE 1 CAPSULE BY MOUTH EVERY DAY 90 capsule 0   rosuvastatin (CRESTOR) 10 MG tablet Take 1 tablet (10 mg total) by mouth daily. 90 tablet 0   sucralfate (CARAFATE) 1 g tablet Take 1 tablet (1 g total) by mouth 4 (four) times daily -  with meals and at bedtime. 28 tablet 0   triamcinolone cream (KENALOG) 0.1 % APPLY 1 APPLICATION TOPICALLY 2 (TWO) TIMES DAILY. TO EXTERNAL EAR 30 g 0   valsartan (DIOVAN) 160 MG tablet Take 1 tablet (160 mg total) by mouth daily. 180 tablet 3   No current facility-administered medications for this visit.      PHYSICAL EXAMINATION: ECOG PERFORMANCE STATUS: 0 - Asymptomatic Vitals:   05/28/19 1024  BP: (!) 149/63  Pulse: 66  Temp: (!) 96.6 F (35.9 C)   Filed Weights   05/28/19 1024  Weight: 194 lb 8 oz (88.2 kg)    Physical Exam Constitutional:      General: He is not in acute distress. HENT:     Head: Normocephalic and atraumatic.  Eyes:     General: No scleral icterus.    Pupils: Pupils are equal, round, and reactive to light.  Neck:      Musculoskeletal: Normal range of motion and neck supple.  Cardiovascular:     Rate and Rhythm: Normal rate and regular rhythm.     Heart sounds: Normal heart sounds.  Pulmonary:     Effort: Pulmonary effort is normal. No respiratory distress.     Breath sounds: No wheezing.  Abdominal:  General: Bowel sounds are normal. There is no distension.     Palpations: Abdomen is soft. There is no mass.     Tenderness: There is no abdominal tenderness.  Musculoskeletal: Normal range of motion.        General: No deformity.  Skin:    General: Skin is warm and dry.     Findings: No erythema or rash.  Neurological:     Mental Status: He is alert and oriented to person, place, and time.     Cranial Nerves: No cranial nerve deficit.     Coordination: Coordination normal.  Psychiatric:        Behavior: Behavior normal.        Thought Content: Thought content normal.     LABORATORY DATA:  I have reviewed the data as listed Lab Results  Component Value Date   WBC 5.5 05/21/2019   HGB 10.1 (L) 05/21/2019   HCT 32.5 (L) 05/21/2019   MCV 85.1 05/21/2019   PLT 293 05/21/2019   Recent Labs    03/12/19 1020 05/15/19 1111 05/17/19 1212  NA 138 139 140  K 4.4 4.7 4.4  CL 102 103 106  CO2 20 19* 24  GLUCOSE 117* 111* 131*  BUN 16 23 23   CREATININE 1.21 1.27 1.36*  CALCIUM 9.7 9.9 9.4  GFRNONAA 59* 56* 52*  GFRAA 69 65 60*  PROT 7.2 6.8 8.2*  ALBUMIN 4.7 4.3 4.2  AST 19 21 31   ALT 15 18 30   ALKPHOS 77 71 67  BILITOT 0.3 <0.2 0.5   Iron/TIBC/Ferritin/ %Sat    Component Value Date/Time   IRON 119 04/27/2019 0940   TIBC 363 04/27/2019 0940   FERRITIN 58 04/27/2019 0940   IRONPCTSAT 33 04/27/2019 0940      RADIOGRAPHIC STUDIES: I have personally reviewed the radiological images as listed and agreed with the findings in the report.  US Abdomen Complete  Result Date: 05/15/2019 CLINICAL DATA:  73 year old male with right upper quadrant abdominal pain. Cholecystectomy in 2019.  EXAM: ABDOMEN ULTRASOUND COMPLETE COMPARISON:  Chest CT 05/10/2019. Ultrasound 07/24/2018. Lumbar MRI 01/15/2017. FINDINGS: Gallbladder: Surgically absent. Common bile duct: Diameter: 2 millimeters, normal. Liver: Around hypoechoic lesion in the right lobe on image 15 measures up to 20 millimeters diameter. 3 smaller round hypoechoic lesions are suggested in the left lobe (image 25) and central right lobe (image 26). These were not evident in 2019. Background liver echogenicity is within normal limits. Portal vein is patent on color Doppler imaging with normal direction of blood flow towards the liver. IVC: No abnormality visualized. Pancreas: Incompletely visualized due to overlying bowel gas, visualized portions within normal limits. Spleen: Size and appearance within normal limits. Right Kidney: Length: 11.8 centimeters. Chronic left renal lower pole cyst appears not significantly changed since 2018 and benign with no vascular elements (image 47). No right hydronephrosis. Left Kidney: Length: 12.8 centimeters. Stable small benign appearing midpole cyst since 2018, 19 millimeters. No left hydronephrosis. Abdominal aorta: No aneurysm visualized. Other findings: No free fluid. IMPRESSION: 1. Multiple new round hypoechoic liver lesions which were not evident on the 2019 ultrasound range from 8 mm to 20 mm diameter. These are nonspecific but suspicious for hepatic metastatic disease. Given that Chest CT was just recently performed on 05/10/2019, recommend follow-up CT Abdomen and Pelvis with oral and IV contrast. 2. Otherwise negative abdomen ultrasound status post cholecystectomy. These results will be called to the ordering clinician or representative by the Radiologist Assistant, and communication documented in the  PACS or zVision Dashboard. Electronically Signed   By: Genevie Ann M.D.   On: 05/15/2019 14:02   Ct Abdomen Pelvis W Contrast  Result Date: 05/16/2019 CLINICAL DATA:  Heavy alcohol abuse. Abdominal pain  for 10 days. Indeterminate hypoechoic liver lesions on recent sonogram. EXAM: CT ABDOMEN AND PELVIS WITH CONTRAST TECHNIQUE: Multidetector CT imaging of the abdomen and pelvis was performed using the standard protocol following bolus administration of intravenous contrast. CONTRAST:  125mL OMNIPAQUE IOHEXOL 300 MG/ML  SOLN COMPARISON:  05/15/2019 abdominal sonogram. FINDINGS: Lower chest: There is a 4.4 x 2.6 cm pleural based mass at the extreme medial basilar left lung base (series 2/image 12). A few scattered tiny solid pulmonary nodules at the right lung base, largest 4 mm in the right lower lobe (series 3/image 11). Coronary atherosclerosis. Visualized lower sternotomy wires are intact. Hepatobiliary: There are several (at least 8) ill-defined hypodense liver masses scattered throughout the liver, largest 3.1 x 2.7 cm in segment 6 right liver lobe (series 2/image 25) and 1.5 x 1.1 cm in the segment 7 right liver lobe (series 2/image 20). Cholecystectomy. No biliary ductal dilatation. Pancreas: There is a poorly marginated heterogeneous hypodense pancreatic mass at the uncinate process measuring approximately 2.9 x 2.7 cm (series 2/image 31). This mass abuts approximately 30-40% of the circumference of the proximal SMA posteriorly (series 2/image 29). The mass encases a tributary of the SMV (series 2/image 33). No involvement by the mass of the portosplenic venous confluence or main portal vein. No pancreatic duct dilation. Spleen: Normal size. No mass. Adrenals/Urinary Tract: Normal adrenals. Simple bilateral renal cysts, largest 4.5 cm in the lower right kidney. Scattered subcentimeter hypodense renal cortical lesions in both kidneys are too small to characterize. No hydronephrosis. Normal bladder. Stomach/Bowel: Normal non-distended stomach. Normal caliber small bowel with no small bowel wall thickening. Normal appendix. Normal large bowel with no diverticulosis, large bowel wall thickening or pericolonic fat  stranding. Vascular/Lymphatic: Atherosclerotic nonaneurysmal abdominal aorta. Patent portal, splenic, hepatic and renal veins. A few mildly enlarged portacaval nodes, largest 1.4 cm (series 2/image 23). Mildly enlarged 1.0 cm aortocaval node (series 2/image 35). No pelvic adenopathy. Reproductive: Mildly enlarged prostate. Other: No pneumoperitoneum, ascites or focal fluid collection. Musculoskeletal: No aggressive appearing focal osseous lesions. Status post posterior spinal fixation bilaterally at L4-5. Moderate thoracolumbar spondylosis. IMPRESSION: 1. Poorly marginated heterogeneous hypodense 2.9 cm pancreatic mass at the uncinate process, worrisome for primary pancreatic adenocarcinoma. No biliary or pancreatic duct dilation. MRI abdomen without and with IV contrast is indicated for further evaluation. 2. Several ill-defined hypodense liver masses scattered throughout the liver, largest 3.1 cm in the segment 6 right liver lobe, worrisome for liver metastases. 3. Nonspecific portacaval and aortocaval adenopathy, worrisome for metastatic nodes. 4. 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 Additional tiny pulmonary nodules scattered at the right lung base are indeterminate and should be reassessed on follow-up chest CT in 3 months. 5. Mild prostatomegaly. 6.  Aortic Atherosclerosis (ICD10-I70.0). These results will be called to the ordering clinician or representative by the Radiologist Assistant, and communication documented in the PACS or zVision Dashboard. Electronically Signed   By: Ilona Sorrel M.D.   On: 05/16/2019 15:13   US Biopsy (liver)  Result Date: 05/21/2019 INDICATION: Multiple liver lesions. EXAM: ULTRASOUND DIRECTED LIVER BIOPSY. MEDICATIONS: None. ANESTHESIA/SEDATION: Moderate (conscious) sedation was employed during this procedure. A total of Versed 4.5 mg and Fentanyl 100 mcg was administered intravenously. Moderate Sedation Time: 37 minutes.  The  patient's level of consciousness and vital signs were monitored continuously by radiology nursing throughout the procedure under my direct supervision. FLUOROSCOPY TIME:  Fluoroscopy Time: 0 COMPLICATIONS: None immediate. PROCEDURE: After discussing the risks and benefits of this procedure with the patient informed consent was obtained. Abdomen sterilely prepped and draped. Local anesthesia administered 1% lidocaine. IV conscious sedation performed as above. Ultrasound liver biopsy was performed with a 18 gauge needle. Two good samples obtained. No complications. Post procedure patient stable. Discharge instructions given. Follow-up with patient's physician. IMPRESSION: Successful ultrasound directed liver mass biopsy. Electronically Signed   By: Marcello Moores  Register   On: 05/21/2019 09:55   Ct Biopsy  Result Date: 05/24/2019 INDICATION: Probable liver metastasis. EXAM: CT DIRECTED LIVER MASS BIOPSY MEDICATIONS: None. ANESTHESIA/SEDATION: Moderate (conscious) sedation was employed during this procedure. A total of Versed 3.0 mg and Fentanyl 50.0 mcg was administered intravenously. Moderate Sedation Time: 15 minutes. The patient's level of consciousness and vital signs were monitored continuously by radiology nursing throughout the procedure under my direct supervision. FLUOROSCOPY TIME:  Fluoroscopy Time: 9 COMPLICATIONS: None immediate. PROCEDURE: After discussing the risks and benefits of this procedure with the patient informed consent was obtained local anesthesia administered 1% lidocaine. Under CT guidance CT-directed liver mass biopsy was performed without difficulty. Two 13 mm 18 gauge core samples obtained and sent to pathology. Post biopsy CT revealed no complications. Patient was transferred to specials recovery. Patient was evaluated in specials recovery and noted to be stable. Discharge instructions were given to him and his wife. Follow-up is with oncology. IMPRESSION: Successful CT directed liver mass  biopsy. Electronically Signed   By: Marcello Moores  Register   On: 05/24/2019 12:22   Ct Chest Lung Cancer Screening Low Dose Wo Contrast  Result Date: 05/10/2019 CLINICAL DATA:  73 year old male with 38 pack-year history of smoking. Lung cancer screening. EXAM: CT CHEST WITHOUT CONTRAST LOW-DOSE FOR LUNG CANCER SCREENING TECHNIQUE: Multidetector CT imaging of the chest was performed following the standard protocol without IV contrast. COMPARISON:  None. FINDINGS: Cardiovascular: The heart size is normal. No substantial pericardial effusion. Status post CABG. Atherosclerotic calcification is noted in the wall of the thoracic aorta. Mediastinum/Nodes: No mediastinal lymphadenopathy. No evidence for gross hilar lymphadenopathy although assessment is limited by the lack of intravenous contrast on today's study. The esophagus has normal imaging features. There is no axillary lymphadenopathy. Lungs/Pleura: Numerous tiny pulmonary nodules are seen scattered in both lungs, measuring up to a maximum volume derived equivalent diameter of 4.4 mm. No overtly suspicious nodule or mass. No pleural effusion. Centrilobular emphsyema noted. Upper Abdomen: Unremarkable. Musculoskeletal: No worrisome lytic or sclerotic osseous abnormality. Centrilobular emphsyema noted. IMPRESSION: 1. Lung-RADS 2, benign appearance or behavior. Continue annual screening with low-dose chest CT without contrast in 12 months. 2.  Aortic Atherosclerois (ICD10-170.0) 3.  Emphysema. ([id number]) Electronically Signed   By: Misty Stanley M.D.   On: 05/10/2019 17:31      ASSESSMENT & PLAN:  1. Pancreatic mass   2. Liver metastasis ([place])   3. High serum carbohydrate antigen 19-9 (CA19-9)    #Images were independent reviewed by me and discussed with patient. Labs reviewed and discussed.  He has extremely high CA-19-9 with radiographic evidence of a pancreatic mass with multiple liver lesions. 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 I have communicated with pathologist.  Prelim report from repeat liver biopsy shows malignant cells.  Awaiting IHC  I had a discussion with patient  about possible cancer diagnosis.  He expresses interest in chemotherapy treatments if diagnosis of pancreatic cancer is confirmed. Recommend PET scan to complete staging.  He also has pleural based mass on left lung base 4.4 cm, probably pleural metastasis.  PET scan will provide additional information.  I will schedule patient for chemotherapy class.   I explained to the patient the risks and benefits of chemotherapy including all but not limited to infusion reaction, hair loss, hearing loss, mouth sore, nausea, vomiting, low blood counts, bleeding, heart failure, kidney failure and risk of life threatening infection and even death, secondary malignancy etc.  Patient voices understanding and willing to proceed chemotherapy.   # Chemotherapy education; port placement. Hopefully the planned start chemotherapy next week. Antiemetics-Zofran and Compazine; EMLA cream sent to pharmacy  We discussed about Mediport placement to facilitate chemotherapy. Patient has not discussed possible cancer diagnosis with his wife or any of his other family members.  His wife has questions. Nurse navigator Steffanie Dunn will call him and set up a phone call between patient and his wife, and me  to the discussed the final pathology result and finalizing chemotherapy plans. Refer to palliative care.  Orders Placed This Encounter  Procedures   NM PET Image Initial (PI) Skull Base To Thigh    Standing Status:   Future    Standing Expiration Date:   05/27/2020    Order Specific Question:   ** REASON FOR EXAM (FREE TEXT)    Answer:   Pancreatic cancer    Order Specific Question:   If indicated for the ordered procedure, I authorize the administration of a radiopharmaceutical per Radiology protocol    Answer:   Yes    Order Specific  Question:   Preferred imaging location?    Answer:   [facility]    Order Specific Question:   Radiology Contrast Protocol - do NOT remove file path    Answer:   [email or web address]   Ambulatory Referral to Palliative Care    Referral Priority:   Routine    Referral Type:   Consultation    Referred to Provider:   Borders, Kirt Boys, NP    Number of Visits Requested:   1    All questions were answered. The patient knows to call the clinic with any problems questions or concerns.  cc Trinna Post, PA-C    Return of visit:  To be determined.  We spent sufficient time to discuss many aspect of care, questions were answered to patient's satisfaction. Total face to face encounter time for this patient visit was 40 min. >50% of the time was  spent in counseling and coordination of care.    Earlie Server, MD, PhD Hematology Oncology [place] [facility] 05/28/2019

## 2019-05-31 ENCOUNTER — Encounter: Payer: Self-pay | Admitting: Oncology

## 2019-05-31 ENCOUNTER — Inpatient Hospital Stay: Payer: Medicare Other | Admitting: Hospice and Palliative Medicine

## 2019-05-31 ENCOUNTER — Inpatient Hospital Stay (HOSPITAL_BASED_OUTPATIENT_CLINIC_OR_DEPARTMENT_OTHER): Payer: Medicare Other | Admitting: Oncology

## 2019-05-31 ENCOUNTER — Telehealth (INDEPENDENT_AMBULATORY_CARE_PROVIDER_SITE_OTHER): Payer: Self-pay

## 2019-05-31 ENCOUNTER — Other Ambulatory Visit: Payer: Medicare Other

## 2019-05-31 ENCOUNTER — Other Ambulatory Visit: Payer: Self-pay

## 2019-05-31 VITALS — BP 131/56 | HR 66 | Temp 98.3°F | Wt 196.0 lb

## 2019-05-31 DIAGNOSIS — C259 Malignant neoplasm of pancreas, unspecified: Secondary | ICD-10-CM

## 2019-05-31 DIAGNOSIS — C787 Secondary malignant neoplasm of liver and intrahepatic bile duct: Secondary | ICD-10-CM | POA: Diagnosis not present

## 2019-05-31 DIAGNOSIS — Z5111 Encounter for antineoplastic chemotherapy: Secondary | ICD-10-CM | POA: Diagnosis not present

## 2019-05-31 DIAGNOSIS — Z7189 Other specified counseling: Secondary | ICD-10-CM | POA: Diagnosis not present

## 2019-05-31 LAB — SURGICAL PATHOLOGY

## 2019-05-31 NOTE — Patient Instructions (Signed)
Irinotecan injection What is this medicine? IRINOTECAN (ir in oh TEE kan ) is a chemotherapy drug. It is used to treat colon and rectal cancer. This medicine may be used for other purposes; ask your health care provider or pharmacist if you have questions. COMMON BRAND NAME(S): Camptosar What should I tell my health care provider before I take this medicine? They need to know if you have any of these conditions:  dehydration  diarrhea  infection (especially a virus infection such as chickenpox, cold sores, or herpes)  liver disease  low blood counts, like low white cell, platelet, or red cell counts  low levels of calcium, magnesium, or potassium in the blood  recent or ongoing radiation therapy  an unusual or allergic reaction to irinotecan, other medicines, foods, dyes, or preservatives  pregnant or trying to get pregnant  breast-feeding How should I use this medicine? This drug is given as an infusion into a vein. It is administered in a hospital or clinic by a specially trained health care professional. Talk to your pediatrician regarding the use of this medicine in children. Special care may be needed. Overdosage: If you think you have taken too much of this medicine contact a poison control center or emergency room at once. NOTE: This medicine is only for you. Do not share this medicine with others. What if I miss a dose? It is important not to miss your dose. Call your doctor or health care professional if you are unable to keep an appointment. What may interact with this medicine? This medicine may interact with the following medications:  antiviral medicines for HIV or AIDS  certain antibiotics like rifampin or rifabutin  certain medicines for fungal infections like itraconazole, ketoconazole, posaconazole, and voriconazole  certain medicines for seizures like carbamazepine, phenobarbital, phenotoin  clarithromycin  gemfibrozil  nefazodone  [facility]  Wort This list may not describe all possible interactions. Give your health care provider a list of all the medicines, herbs, non-prescription drugs, or dietary supplements you use. Also tell them if you smoke, drink alcohol, or use illegal drugs. Some items may interact with your medicine. What should I watch for while using this medicine? Your condition will be monitored carefully while you are receiving this medicine. You will need important blood work done while you are taking this medicine. This drug may make you feel generally unwell. This is not uncommon, as chemotherapy can affect healthy cells as well as cancer cells. Report any side effects. Continue your course of treatment even though you feel ill unless your doctor tells you to stop. In some cases, you may be given additional medicines to help with side effects. Follow all directions for their use. You may get drowsy or dizzy. Do not drive, use machinery, or do anything that needs mental alertness until you know how this medicine affects you. Do not stand or sit up quickly, especially if you are an older patient. This reduces the risk of dizzy or fainting spells. Call your health care professional for advice if you get a fever, chills, or sore throat, or other symptoms of a cold or flu. Do not treat yourself. This medicine decreases your body's ability to fight infections. Try to avoid being around people who are sick. Avoid taking products that contain aspirin, acetaminophen, ibuprofen, naproxen, or ketoprofen unless instructed by your doctor. These medicines may hide a fever. This medicine may increase your risk to bruise or bleed. Call your doctor or health care professional if you notice  any unusual bleeding. Be careful brushing and flossing your teeth or using a toothpick because you may get an infection or bleed more easily. If you have any dental work done, tell your dentist you are receiving this medicine. Do not become pregnant while  taking this medicine or for 6 months after stopping it. Women should inform their health care professional if they wish to become pregnant or think they might be pregnant. Men should not father a child while taking this medicine and for 3 months after stopping it. There is potential for serious side effects to an unborn child. Talk to your health care professional for more information. Do not breast-feed an infant while taking this medicine or for 7 days after stopping it. This medicine has caused ovarian failure in some women. This medicine may make it more difficult to get pregnant. Talk to your health care professional if you are concerned about your fertility. This medicine has caused decreased sperm counts in some men. This may make it more difficult to father a child. Talk to your health care professional if you are concerned about your fertility. What side effects may I notice from receiving this medicine? Side effects that you should report to your doctor or health care professional as soon as possible:  allergic reactions like skin rash, itching or hives, swelling of the face, lips, or tongue  chest pain  diarrhea  flushing, runny nose, sweating during infusion  low blood counts - this medicine may decrease the number of white blood cells, red blood cells and platelets. You may be at increased risk for infections and bleeding.  nausea, vomiting  pain, swelling, warmth in the leg  signs of decreased platelets or bleeding - bruising, pinpoint red spots on the skin, black, tarry stools, blood in the urine  signs of infection - fever or chills, cough, sore throat, pain or difficulty passing urine  signs of decreased red blood cells - unusually weak or tired, fainting spells, lightheadedness Side effects that usually do not require medical attention (report to your doctor or health care professional if they continue or are bothersome):  constipation  hair loss  headache  loss of  appetite  mouth sores  stomach pain This list may not describe all possible side effects. Call your doctor for medical advice about side effects. You may report side effects to FDA at 1-800-FDA-1088. Where should I keep my medicine? This drug is given in a hospital or clinic and will not be stored at home. NOTE: This sheet is a summary. It may not cover all possible information. If you have questions about this medicine, talk to your doctor, pharmacist, or health care provider.  2020 Else[employer]/Gold Standard (2018-11-17 10:09:17) Oxaliplatin Injection What is this medicine? OXALIPLATIN (ox AL i PLA tin) is a chemotherapy drug. It targets fast dividing cells, like cancer cells, and causes these cells to die. This medicine is used to treat cancers of the colon and rectum, and many other cancers. This medicine may be used for other purposes; ask your health care provider or pharmacist if you have questions. COMMON BRAND NAME(S): Eloxatin What should I tell my health care provider before I take this medicine? They need to know if you have any of these conditions:  kidney disease  an unusual or allergic reaction to oxaliplatin, other chemotherapy, other medicines, foods, dyes, or preservatives  pregnant or trying to get pregnant  breast-feeding How should I use this medicine? This drug is given as an infusion into  a vein. It is administered in a hospital or clinic by a specially trained health care professional. Talk to your pediatrician regarding the use of this medicine in children. Special care may be needed. Overdosage: If you think you have taken too much of this medicine contact a poison control center or emergency room at once. NOTE: This medicine is only for you. Do not share this medicine with others. What if I miss a dose? It is important not to miss a dose. Call your doctor or health care professional if you are unable to keep an appointment. What may interact with this  medicine?  medicines to increase blood counts like filgrastim, pegfilgrastim, sargramostim  probenecid  some antibiotics like amikacin, gentamicin, neomycin, polymyxin B, streptomycin, tobramycin  zalcitabine Talk to your doctor or health care professional before taking any of these medicines:  acetaminophen  aspirin  ibuprofen  ketoprofen  naproxen This list may not describe all possible interactions. Give your health care provider a list of all the medicines, herbs, non-prescription drugs, or dietary supplements you use. Also tell them if you smoke, drink alcohol, or use illegal drugs. Some items may interact with your medicine. What should I watch for while using this medicine? Your condition will be monitored carefully while you are receiving this medicine. You will need important blood work done while you are taking this medicine. This medicine can make you more sensitive to cold. Do not drink cold drinks or use ice. Cover exposed skin before coming in contact with cold temperatures or cold objects. When out in cold weather wear warm clothing and cover your mouth and nose to warm the air that goes into your lungs. Tell your doctor if you get sensitive to the cold. This drug may make you feel generally unwell. This is not uncommon, as chemotherapy can affect healthy cells as well as cancer cells. Report any side effects. Continue your course of treatment even though you feel ill unless your doctor tells you to stop. In some cases, you may be given additional medicines to help with side effects. Follow all directions for their use. Call your doctor or health care professional for advice if you get a fever, chills or sore throat, or other symptoms of a cold or flu. Do not treat yourself. This drug decreases your body's ability to fight infections. Try to avoid being around people who are sick. This medicine may increase your risk to bruise or bleed. Call your doctor or health care  professional if you notice any unusual bleeding. Be careful brushing and flossing your teeth or using a toothpick because you may get an infection or bleed more easily. If you have any dental work done, tell your dentist you are receiving this medicine. Avoid taking products that contain aspirin, acetaminophen, ibuprofen, naproxen, or ketoprofen unless instructed by your doctor. These medicines may hide a fever. Do not become pregnant while taking this medicine. Women should inform their doctor if they wish to become pregnant or think they might be pregnant. There is a potential for serious side effects to an unborn child. Talk to your health care professional or pharmacist for more information. Do not breast-feed an infant while taking this medicine. Call your doctor or health care professional if you get diarrhea. Do not treat yourself. What side effects may I notice from receiving this medicine? Side effects that you should report to your doctor or health care professional as soon as possible:  allergic reactions like skin rash, itching or hives,  swelling of the face, lips, or tongue  low blood counts - This drug may decrease the number of white blood cells, red blood cells and platelets. You may be at increased risk for infections and bleeding.  signs of infection - fever or chills, cough, sore throat, pain or difficulty passing urine  signs of decreased platelets or bleeding - bruising, pinpoint red spots on the skin, black, tarry stools, nosebleeds  signs of decreased red blood cells - unusually weak or tired, fainting spells, lightheadedness  breathing problems  chest pain, pressure  cough  diarrhea  jaw tightness  mouth sores  nausea and vomiting  pain, swelling, redness or irritation at the injection site  pain, tingling, numbness in the hands or feet  problems with balance, talking, walking  redness, blistering, peeling or loosening of the skin, including inside the  mouth  trouble passing urine or change in the amount of urine Side effects that usually do not require medical attention (report to your doctor or health care professional if they continue or are bothersome):  changes in vision  constipation  hair loss  loss of appetite  metallic taste in the mouth or changes in taste  stomach pain This list may not describe all possible side effects. Call your doctor for medical advice about side effects. You may report side effects to FDA at 1-800-FDA-1088. Where should I keep my medicine? This drug is given in a hospital or clinic and will not be stored at home. NOTE: This sheet is a summary. It may not cover all possible information. If you have questions about this medicine, talk to your doctor, pharmacist, or health care provider.  2020 Elsevier/Gold Standard (2008-04-23 17:22:47) Fluorouracil, 5-FU injection What is this medicine? FLUOROURACIL, 5-FU (flure oh YOOR a sil) is a chemotherapy drug. It slows the growth of cancer cells. This medicine is used to treat many types of cancer like breast cancer, colon or rectal cancer, pancreatic cancer, and stomach cancer. This medicine may be used for other purposes; ask your health care provider or pharmacist if you have questions. COMMON BRAND NAME(S): Adrucil What should I tell my health care provider before I take this medicine? They need to know if you have any of these conditions:  blood disorders  dihydropyrimidine dehydrogenase (DPD) deficiency  infection (especially a virus infection such as chickenpox, cold sores, or herpes)  kidney disease  liver disease  malnourished, poor nutrition  recent or ongoing radiation therapy  an unusual or allergic reaction to fluorouracil, other chemotherapy, other medicines, foods, dyes, or preservatives  pregnant or trying to get pregnant  breast-feeding How should I use this medicine? This drug is given as an infusion or injection into a  vein. It is administered in a hospital or clinic by a specially trained health care professional. Talk to your pediatrician regarding the use of this medicine in children. Special care may be needed. Overdosage: If you think you have taken too much of this medicine contact a poison control center or emergency room at once. NOTE: This medicine is only for you. Do not share this medicine with others. What if I miss a dose? It is important not to miss your dose. Call your doctor or health care professional if you are unable to keep an appointment. What may interact with this medicine?  allopurinol  cimetidine  dapsone  digoxin  hydroxyurea  leucovorin  levamisole  medicines for seizures like ethotoin, fosphenytoin, phenytoin  medicines to increase blood counts like filgrastim, pegfilgrastim,  sargramostim  medicines that treat or prevent blood clots like warfarin, enoxaparin, and dalteparin  methotrexate  metronidazole  pyrimethamine  some other chemotherapy drugs like busulfan, cisplatin, estramustine, vinblastine  trimethoprim  trimetrexate  vaccines Talk to your doctor or health care professional before taking any of these medicines:  acetaminophen  aspirin  ibuprofen  ketoprofen  naproxen This list may not describe all possible interactions. Give your health care provider a list of all the medicines, herbs, non-prescription drugs, or dietary supplements you use. Also tell them if you smoke, drink alcohol, or use illegal drugs. Some items may interact with your medicine. What should I watch for while using this medicine? Visit your doctor for checks on your progress. This drug may make you feel generally unwell. This is not uncommon, as chemotherapy can affect healthy cells as well as cancer cells. Report any side effects. Continue your course of treatment even though you feel ill unless your doctor tells you to stop. In some cases, you may be given additional  medicines to help with side effects. Follow all directions for their use. Call your doctor or health care professional for advice if you get a fever, chills or sore throat, or other symptoms of a cold or flu. Do not treat yourself. This drug decreases your body's ability to fight infections. Try to avoid being around people who are sick. This medicine may increase your risk to bruise or bleed. Call your doctor or health care professional if you notice any unusual bleeding. Be careful brushing and flossing your teeth or using a toothpick because you may get an infection or bleed more easily. If you have any dental work done, tell your dentist you are receiving this medicine. Avoid taking products that contain aspirin, acetaminophen, ibuprofen, naproxen, or ketoprofen unless instructed by your doctor. These medicines may hide a fever. Do not become pregnant while taking this medicine. Women should inform their doctor if they wish to become pregnant or think they might be pregnant. There is a potential for serious side effects to an unborn child. Talk to your health care professional or pharmacist for more information. Do not breast-feed an infant while taking this medicine. Men should inform their doctor if they wish to father a child. This medicine may lower sperm counts. Do not treat diarrhea with over the counter products. Contact your doctor if you have diarrhea that lasts more than 2 days or if it is severe and watery. This medicine can make you more sensitive to the sun. Keep out of the sun. If you cannot avoid being in the sun, wear protective clothing and use sunscreen. Do not use sun lamps or tanning beds/booths. What side effects may I notice from receiving this medicine? Side effects that you should report to your doctor or health care professional as soon as possible:  allergic reactions like skin rash, itching or hives, swelling of the face, lips, or tongue  low blood counts - this medicine  may decrease the number of white blood cells, red blood cells and platelets. You may be at increased risk for infections and bleeding.  signs of infection - fever or chills, cough, sore throat, pain or difficulty passing urine  signs of decreased platelets or bleeding - bruising, pinpoint red spots on the skin, black, tarry stools, blood in the urine  signs of decreased red blood cells - unusually weak or tired, fainting spells, lightheadedness  breathing problems  changes in vision  chest pain  mouth sores  nausea  and vomiting  pain, swelling, redness at site where injected  pain, tingling, numbness in the hands or feet  redness, swelling, or sores on hands or feet  stomach pain  unusual bleeding Side effects that usually do not require medical attention (report to your doctor or health care professional if they continue or are bothersome):  changes in finger or toe nails  diarrhea  dry or itchy skin  hair loss  headache  loss of appetite  sensitivity of eyes to the light  stomach upset  unusually teary eyes This list may not describe all possible side effects. Call your doctor for medical advice about side effects. You may report side effects to FDA at 1-800-FDA-1088. Where should I keep my medicine? This drug is given in a hospital or clinic and will not be stored at home. NOTE: This sheet is a summary. It may not cover all possible information. If you have questions about this medicine, talk to your doctor, pharmacist, or health care provider.  2020 Elsevier/Gold Standard (2008-01-31 13:53:16) Leucovorin injection What is this medicine? LEUCOVORIN (loo koe VOR in) is used to prevent or treat the harmful effects of some medicines. This medicine is used to treat anemia caused by a low amount of folic acid in the body. It is also used with 5-fluorouracil (5-FU) to treat colon cancer. This medicine may be used for other purposes; ask your health care provider or  pharmacist if you have questions. What should I tell my health care provider before I take this medicine? They need to know if you have any of these conditions:  anemia from low levels of vitamin B-12 in the blood  an unusual or allergic reaction to leucovorin, folic acid, other medicines, foods, dyes, or preservatives  pregnant or trying to get pregnant  breast-feeding How should I use this medicine? This medicine is for injection into a muscle or into a vein. It is given by a health care professional in a hospital or clinic setting. Talk to your pediatrician regarding the use of this medicine in children. Special care may be needed. Overdosage: If you think you have taken too much of this medicine contact a poison control center or emergency room at once. NOTE: This medicine is only for you. Do not share this medicine with others. What if I miss a dose? This does not apply. What may interact with this medicine?  capecitabine  fluorouracil  phenobarbital  phenytoin  primidone  trimethoprim-sulfamethoxazole This list may not describe all possible interactions. Give your health care provider a list of all the medicines, herbs, non-prescription drugs, or dietary supplements you use. Also tell them if you smoke, drink alcohol, or use illegal drugs. Some items may interact with your medicine. What should I watch for while using this medicine? Your condition will be monitored carefully while you are receiving this medicine. This medicine may increase the side effects of 5-fluorouracil, 5-FU. Tell your doctor or health care professional if you have diarrhea or mouth sores that do not get better or that get worse. What side effects may I notice from receiving this medicine? Side effects that you should report to your doctor or health care professional as soon as possible:  allergic reactions like skin rash, itching or hives, swelling of the face, lips, or tongue  breathing  problems  fever, infection  mouth sores  unusual bleeding or bruising  unusually weak or tired Side effects that usually do not require medical attention (report to your doctor or  health care professional if they continue or are bothersome):  constipation or diarrhea  loss of appetite  nausea, vomiting This list may not describe all possible side effects. Call your doctor for medical advice about side effects. You may report side effects to FDA at 1-800-FDA-1088. Where should I keep my medicine? This drug is given in a hospital or clinic and will not be stored at home. NOTE: This sheet is a summary. It may not cover all possible information. If you have questions about this medicine, talk to your doctor, pharmacist, or health care provider.  2020 [employer]/Gold Standard (2008-04-02 16:50:29) Gemcitabine injection What is this medicine? GEMCITABINE (jem SYE ta been) is a chemotherapy drug. This medicine is used to treat many types of cancer like breast cancer, lung cancer, pancreatic cancer, and ovarian cancer. This medicine may be used for other purposes; ask your health care provider or pharmacist if you have questions. COMMON BRAND NAME(S): Gemzar, Infugem What should I tell my health care provider before I take this medicine? They need to know if you have any of these conditions:  blood disorders  infection  kidney disease  liver disease  lung or breathing disease, like asthma  recent or ongoing radiation therapy  an unusual or allergic reaction to gemcitabine, other chemotherapy, other medicines, foods, dyes, or preservatives  pregnant or trying to get pregnant  breast-feeding How should I use this medicine? This drug is given as an infusion into a vein. It is administered in a hospital or clinic by a specially trained health care professional. Talk to your pediatrician regarding the use of this medicine in children. Special care may be needed. Overdosage: If you  think you have taken too much of this medicine contact a poison control center or emergency room at once. NOTE: This medicine is only for you. Do not share this medicine with others. What if I miss a dose? It is important not to miss your dose. Call your doctor or health care professional if you are unable to keep an appointment. What may interact with this medicine?  medicines to increase blood counts like filgrastim, pegfilgrastim, sargramostim  some other chemotherapy drugs like cisplatin  vaccines Talk to your doctor or health care professional before taking any of these medicines:  acetaminophen  aspirin  ibuprofen  ketoprofen  naproxen This list may not describe all possible interactions. Give your health care provider a list of all the medicines, herbs, non-prescription drugs, or dietary supplements you use. Also tell them if you smoke, drink alcohol, or use illegal drugs. Some items may interact with your medicine. What should I watch for while using this medicine? Visit your doctor for checks on your progress. This drug may make you feel generally unwell. This is not uncommon, as chemotherapy can affect healthy cells as well as cancer cells. Report any side effects. Continue your course of treatment even though you feel ill unless your doctor tells you to stop. In some cases, you may be given additional medicines to help with side effects. Follow all directions for their use. Call your doctor or health care professional for advice if you get a fever, chills or sore throat, or other symptoms of a cold or flu. Do not treat yourself. This drug decreases your body's ability to fight infections. Try to avoid being around people who are sick. This medicine may increase your risk to bruise or bleed. Call your doctor or health care professional if you notice any unusual bleeding. Be careful brushing  and flossing your teeth or using a toothpick because you may get an infection or bleed  more easily. If you have any dental work done, tell your dentist you are receiving this medicine. Avoid taking products that contain aspirin, acetaminophen, ibuprofen, naproxen, or ketoprofen unless instructed by your doctor. These medicines may hide a fever. Do not become pregnant while taking this medicine or for 6 months after stopping it. Women should inform their doctor if they wish to become pregnant or think they might be pregnant. Men should not father a child while taking this medicine and for 3 months after stopping it. There is a potential for serious side effects to an unborn child. Talk to your health care professional or pharmacist for more information. Do not breast-feed an infant while taking this medicine or for at least 1 week after stopping it. Men should inform their doctors if they wish to father a child. This medicine may lower sperm counts. Talk with your doctor or health care professional if you are concerned about your fertility. What side effects may I notice from receiving this medicine? Side effects that you should report to your doctor or health care professional as soon as possible:  allergic reactions like skin rash, itching or hives, swelling of the face, lips, or tongue  breathing problems  pain, redness, or irritation at site where injected  signs and symptoms of a dangerous change in heartbeat or heart rhythm like chest pain; dizziness; fast or irregular heartbeat; palpitations; feeling faint or lightheaded, falls; breathing problems  signs of decreased platelets or bleeding - bruising, pinpoint red spots on the skin, black, tarry stools, blood in the urine  signs of decreased red blood cells - unusually weak or tired, feeling faint or lightheaded, falls  signs of infection - fever or chills, cough, sore throat, pain or difficulty passing urine  signs and symptoms of kidney injury like trouble passing urine or change in the amount of urine  signs and symptoms  of liver injury like dark yellow or brown urine; general ill feeling or flu-like symptoms; light-colored stools; loss of appetite; nausea; right upper belly pain; unusually weak or tired; yellowing of the eyes or skin  swelling of ankles, feet, hands Side effects that usually do not require medical attention (report to your doctor or health care professional if they continue or are bothersome):  constipation  diarrhea  hair loss  loss of appetite  nausea  rash  vomiting This list may not describe all possible side effects. Call your doctor for medical advice about side effects. You may report side effects to FDA at 1-800-FDA-1088. Where should I keep my medicine? This drug is given in a hospital or clinic and will not be stored at home. NOTE: This sheet is a summary. It may not cover all possible information. If you have questions about this medicine, talk to your doctor, pharmacist, or health care provider.  2020 Elsevier/Gold Standard (2017-12-21 18:06:11) Nanoparticle Albumin-Bound Paclitaxel injection What is this medicine? NANOPARTICLE ALBUMIN-BOUND PACLITAXEL (Na no PAHR ti kuhl al BYOO muhn-bound PAK li TAX el) is a chemotherapy drug. It targets fast dividing cells, like cancer cells, and causes these cells to die. This medicine is used to treat advanced breast cancer, lung cancer, and pancreatic cancer. This medicine may be used for other purposes; ask your health care provider or pharmacist if you have questions. COMMON BRAND NAME(S): Abraxane What should I tell my health care provider before I take this medicine? They need to  know if you have any of these conditions:  kidney disease  liver disease  low blood counts, like low white cell, platelet, or red cell counts  lung or breathing disease, like asthma  tingling of the fingers or toes, or other nerve disorder  an unusual or allergic reaction to paclitaxel, albumin, other chemotherapy, other medicines, foods,  dyes, or preservatives  pregnant or trying to get pregnant  breast-feeding How should I use this medicine? This drug is given as an infusion into a vein. It is administered in a hospital or clinic by a specially trained health care professional. Talk to your pediatrician regarding the use of this medicine in children. Special care may be needed. Overdosage: If you think you have taken too much of this medicine contact a poison control center or emergency room at once. NOTE: This medicine is only for you. Do not share this medicine with others. What if I miss a dose? It is important not to miss your dose. Call your doctor or health care professional if you are unable to keep an appointment. What may interact with this medicine? This medicine may interact with the following medications:  antiviral medicines for hepatitis, HIV or AIDS  certain antibiotics like erythromycin and clarithromycin  certain medicines for fungal infections like ketoconazole and itraconazole  certain medicines for seizures like carbamazepine, phenobarbital, phenytoin  gemfibrozil  nefazodone  rifampin  St. John's wort This list may not describe all possible interactions. Give your health care provider a list of all the medicines, herbs, non-prescription drugs, or dietary supplements you use. Also tell them if you smoke, drink alcohol, or use illegal drugs. Some items may interact with your medicine. What should I watch for while using this medicine? Your condition will be monitored carefully while you are receiving this medicine. You will need important blood work done while you are taking this medicine. This medicine can cause serious allergic reactions. If you experience allergic reactions like skin rash, itching or hives, swelling of the face, lips, or tongue, tell your doctor or health care professional right away. In some cases, you may be given additional medicines to help with side effects. Follow all  directions for their use. This drug may make you feel generally unwell. This is not uncommon, as chemotherapy can affect healthy cells as well as cancer cells. Report any side effects. Continue your course of treatment even though you feel ill unless your doctor tells you to stop. Call your doctor or health care professional for advice if you get a fever, chills or sore throat, or other symptoms of a cold or flu. Do not treat yourself. This drug decreases your body's ability to fight infections. Try to avoid being around people who are sick. This medicine may increase your risk to bruise or bleed. Call your doctor or health care professional if you notice any unusual bleeding. Be careful brushing and flossing your teeth or using a toothpick because you may get an infection or bleed more easily. If you have any dental work done, tell your dentist you are receiving this medicine. Avoid taking products that contain aspirin, acetaminophen, ibuprofen, naproxen, or ketoprofen unless instructed by your doctor. These medicines may hide a fever. Do not become pregnant while taking this medicine or for 6 months after stopping it. Women should inform their doctor if they wish to become pregnant or think they might be pregnant. Men should not father a child while taking this medicine or for 3 months after stopping it.  There is a potential for serious side effects to an unborn child. Talk to your health care professional or pharmacist for more information. Do not breast-feed an infant while taking this medicine or for 2 weeks after stopping it. This medicine may interfere with the ability to get pregnant or to father a child. You should talk to your doctor or health care professional if you are concerned about your fertility. What side effects may I notice from receiving this medicine? Side effects that you should report to your doctor or health care professional as soon as possible:  allergic reactions like skin  rash, itching or hives, swelling of the face, lips, or tongue  breathing problems  changes in vision  fast, irregular heartbeat  low blood pressure  mouth sores  pain, tingling, numbness in the hands or feet  signs of decreased platelets or bleeding - bruising, pinpoint red spots on the skin, black, tarry stools, blood in the urine  signs of decreased red blood cells - unusually weak or tired, feeling faint or lightheaded, falls  signs of infection - fever or chills, cough, sore throat, pain or difficulty passing urine  signs and symptoms of liver injury like dark yellow or brown urine; general ill feeling or flu-like symptoms; light-colored stools; loss of appetite; nausea; right upper belly pain; unusually weak or tired; yellowing of the eyes or skin  swelling of the ankles, feet, hands  unusually slow heartbeat Side effects that usually do not require medical attention (report to your doctor or health care professional if they continue or are bothersome):  diarrhea  hair loss  loss of appetite  nausea, vomiting  tiredness This list may not describe all possible side effects. Call your doctor for medical advice about side effects. You may report side effects to FDA at 1-800-FDA-1088. Where should I keep my medicine? This drug is given in a hospital or clinic and will not be stored at home. NOTE: This sheet is a summary. It may not cover all possible information. If you have questions about this medicine, talk to your doctor, pharmacist, or health care provider.  2020 Elsevier/Gold Standard (2017-05-31 13:03:45) Nanoparticle Albumin-Bound Paclitaxel injection What is this medicine? NANOPARTICLE ALBUMIN-BOUND PACLITAXEL (Na no PAHR ti kuhl al BYOO muhn-bound PAK li TAX el) is a chemotherapy drug. It targets fast dividing cells, like cancer cells, and causes these cells to die. This medicine is used to treat advanced breast cancer, lung cancer, and pancreatic cancer. This  medicine may be used for other purposes; ask your health care provider or pharmacist if you have questions. COMMON BRAND NAME(S): Abraxane What should I tell my health care provider before I take this medicine? They need to know if you have any of these conditions:  kidney disease  liver disease  low blood counts, like low white cell, platelet, or red cell counts  lung or breathing disease, like asthma  tingling of the fingers or toes, or other nerve disorder  an unusual or allergic reaction to paclitaxel, albumin, other chemotherapy, other medicines, foods, dyes, or preservatives  pregnant or trying to get pregnant  breast-feeding How should I use this medicine? This drug is given as an infusion into a vein. It is administered in a hospital or clinic by a specially trained health care professional. Talk to your pediatrician regarding the use of this medicine in children. Special care may be needed. Overdosage: If you think you have taken too much of this medicine contact a poison control center or  emergency room at once. NOTE: This medicine is only for you. Do not share this medicine with others. What if I miss a dose? It is important not to miss your dose. Call your doctor or health care professional if you are unable to keep an appointment. What may interact with this medicine? This medicine may interact with the following medications:  antiviral medicines for hepatitis, HIV or AIDS  certain antibiotics like erythromycin and clarithromycin  certain medicines for fungal infections like ketoconazole and itraconazole  certain medicines for seizures like carbamazepine, phenobarbital, phenytoin  gemfibrozil  nefazodone  rifampin  St. John's wort This list may not describe all possible interactions. Give your health care provider a list of all the medicines, herbs, non-prescription drugs, or dietary supplements you use. Also tell them if you smoke, drink alcohol, or use  illegal drugs. Some items may interact with your medicine. What should I watch for while using this medicine? Your condition will be monitored carefully while you are receiving this medicine. You will need important blood work done while you are taking this medicine. This medicine can cause serious allergic reactions. If you experience allergic reactions like skin rash, itching or hives, swelling of the face, lips, or tongue, tell your doctor or health care professional right away. In some cases, you may be given additional medicines to help with side effects. Follow all directions for their use. This drug may make you feel generally unwell. This is not uncommon, as chemotherapy can affect healthy cells as well as cancer cells. Report any side effects. Continue your course of treatment even though you feel ill unless your doctor tells you to stop. Call your doctor or health care professional for advice if you get a fever, chills or sore throat, or other symptoms of a cold or flu. Do not treat yourself. This drug decreases your body's ability to fight infections. Try to avoid being around people who are sick. This medicine may increase your risk to bruise or bleed. Call your doctor or health care professional if you notice any unusual bleeding. Be careful brushing and flossing your teeth or using a toothpick because you may get an infection or bleed more easily. If you have any dental work done, tell your dentist you are receiving this medicine. Avoid taking products that contain aspirin, acetaminophen, ibuprofen, naproxen, or ketoprofen unless instructed by your doctor. These medicines may hide a fever. Do not become pregnant while taking this medicine or for 6 months after stopping it. Women should inform their doctor if they wish to become pregnant or think they might be pregnant. Men should not father a child while taking this medicine or for 3 months after stopping it. There is a potential for serious  side effects to an unborn child. Talk to your health care professional or pharmacist for more information. Do not breast-feed an infant while taking this medicine or for 2 weeks after stopping it. This medicine may interfere with the ability to get pregnant or to father a child. You should talk to your doctor or health care professional if you are concerned about your fertility. What side effects may I notice from receiving this medicine? Side effects that you should report to your doctor or health care professional as soon as possible:  allergic reactions like skin rash, itching or hives, swelling of the face, lips, or tongue  breathing problems  changes in vision  fast, irregular heartbeat  low blood pressure  mouth sores  pain, tingling, numbness in  the hands or feet  signs of decreased platelets or bleeding - bruising, pinpoint red spots on the skin, black, tarry stools, blood in the urine  signs of decreased red blood cells - unusually weak or tired, feeling faint or lightheaded, falls  signs of infection - fever or chills, cough, sore throat, pain or difficulty passing urine  signs and symptoms of liver injury like dark yellow or brown urine; general ill feeling or flu-like symptoms; light-colored stools; loss of appetite; nausea; right upper belly pain; unusually weak or tired; yellowing of the eyes or skin  swelling of the ankles, feet, hands  unusually slow heartbeat Side effects that usually do not require medical attention (report to your doctor or health care professional if they continue or are bothersome):  diarrhea  hair loss  loss of appetite  nausea, vomiting  tiredness This list may not describe all possible side effects. Call your doctor for medical advice about side effects. You may report side effects to FDA at 1-800-FDA-1088. Where should I keep my medicine? This drug is given in a hospital or clinic and will not be stored at home. NOTE: This sheet  is a summary. It may not cover all possible information. If you have questions about this medicine, talk to your doctor, pharmacist, or health care provider.  2020 Elsevier/Gold Standard (2017-05-31 13:03:45)

## 2019-05-31 NOTE — Progress Notes (Addendum)
Tumor Board Documentation  Craig [name] was presented by Dr Tasia Catchings at our Tumor Board on 05/31/2019, which included representatives from medical oncology, radiation oncology, surgical, radiology, navigation, internal medicine, pathology, research, palliative care, pulmonology.  Craig [name] currently presents as a new patient, for [place], for new positive pathology with history of the following treatments: active survellience, surgical intervention(s).  Additionally, we reviewed previous medical and familial history, history of present illness, and recent lab results along with all available histopathologic and imaging studies. The tumor board considered available treatment options and made the following recommendations: Palliative chemotherapy.   The following procedures/referrals were also placed: No orders of the defined types were placed in this encounter.   Clinical Trial Status: not discussed   Staging used: AJCC Stage Group  AJCC Staging:       Group: Stage 4 Metastaticc Pancreatic Carcinoma   National site-specific guidelines NCCN were discussed with respect to the case.  Tumor board is a meeting of clinicians from various specialty areas who evaluate and discuss patients for whom a multidisciplinary approach is being considered. Final determinations in the plan of care are those of the provider(s). The responsibility for follow up of recommendations given during tumor board is that of the provider.   Today's extended care, comprehensive team conference, Craig [name] was not present for the discussion and was not examined.   Multidisciplinary Tumor Board is a multidisciplinary case peer review process.  Decisions discussed in the Multidisciplinary Tumor Board reflect the opinions of the specialists present at the conference without having examined the patient.  Ultimately, treatment and diagnostic decisions rest with the primary provider(s) and the patient.

## 2019-05-31 NOTE — Telephone Encounter (Signed)
I attempted to contact the patient and his phone does not have voice mail and I was unable to make contact or leave a message.

## 2019-05-31 NOTE — Progress Notes (Signed)
Patient here today for biopsy results.  

## 2019-06-01 ENCOUNTER — Encounter: Payer: Self-pay | Admitting: Hospice and Palliative Medicine

## 2019-06-01 ENCOUNTER — Other Ambulatory Visit
Admission: RE | Admit: 2019-06-01 | Discharge: 2019-06-01 | Disposition: A | Discharge status: Home / Self Care | Payer: Medicare Other | Source: Ambulatory Visit | Attending: Vascular Surgery | Admitting: Vascular Surgery

## 2019-06-01 ENCOUNTER — Inpatient Hospital Stay: Payer: Medicare Other

## 2019-06-01 ENCOUNTER — Other Ambulatory Visit: Payer: Self-pay

## 2019-06-01 ENCOUNTER — Inpatient Hospital Stay (HOSPITAL_BASED_OUTPATIENT_CLINIC_OR_DEPARTMENT_OTHER): Payer: Medicare Other | Admitting: Hospice and Palliative Medicine

## 2019-06-01 ENCOUNTER — Telehealth: Payer: Self-pay | Admitting: *Deleted

## 2019-06-01 ENCOUNTER — Ambulatory Visit: Payer: Medicare Other | Admitting: Oncology

## 2019-06-01 VITALS — BP 170/81 | HR 61 | Temp 97.4°F | Resp 20 | Ht 69.0 in | Wt 196.0 lb

## 2019-06-01 DIAGNOSIS — Z20828 Contact with and (suspected) exposure to other viral communicable diseases: Secondary | ICD-10-CM | POA: Diagnosis not present

## 2019-06-01 DIAGNOSIS — C787 Secondary malignant neoplasm of liver and intrahepatic bile duct: Secondary | ICD-10-CM | POA: Insufficient documentation

## 2019-06-01 DIAGNOSIS — Z515 Encounter for palliative care: Secondary | ICD-10-CM

## 2019-06-01 DIAGNOSIS — Z01812 Encounter for preprocedural laboratory examination: Secondary | ICD-10-CM | POA: Diagnosis present

## 2019-06-01 DIAGNOSIS — C259 Malignant neoplasm of pancreas, unspecified: Secondary | ICD-10-CM | POA: Insufficient documentation

## 2019-06-01 DIAGNOSIS — Z5111 Encounter for antineoplastic chemotherapy: Secondary | ICD-10-CM | POA: Diagnosis not present

## 2019-06-01 DIAGNOSIS — Z7189 Other specified counseling: Secondary | ICD-10-CM | POA: Insufficient documentation

## 2019-06-01 MED ORDER — PROCHLORPERAZINE MALEATE 10 MG PO TABS: 10.0000 mg | ORAL_TABLET | Freq: Four times a day (QID) | ORAL | 1 refills | Status: DC | PRN

## 2019-06-01 MED ORDER — LIDOCAINE-PRILOCAINE 2.5-2.5 % EX CREA: TOPICAL_CREAM | CUTANEOUS | 3 refills | Status: DC

## 2019-06-01 MED ORDER — ONDANSETRON HCL 8 MG PO TABS: 8.0000 mg | ORAL_TABLET | Freq: Two times a day (BID) | ORAL | 1 refills | Status: DC | PRN

## 2019-06-01 NOTE — Progress Notes (Signed)
[facility] request sent to pathology for 05-24-19 liver specimen. [phone]. Received confirmation of receipt.

## 2019-06-01 NOTE — Telephone Encounter (Signed)
I spoke with the patient and he will call me back because he is at an appt.

## 2019-06-01 NOTE — Telephone Encounter (Signed)
I just discussed with both of them yesterday. What specific question does she have?

## 2019-06-01 NOTE — Telephone Encounter (Signed)
Spoke with the patient, he is now scheduled with Dr. Delana Meyer for 06/06/2019 with 9:30 am arrival time to the [facility]. Patient will do his Covid testing today 06/01/2019 at the [facility]. Pre-procedure instructions were discussed.

## 2019-06-01 NOTE — Telephone Encounter (Signed)
Wife called requesting Dr Tasia Catchings return her call to discuss patient treatment plan [phone]

## 2019-06-01 NOTE — Progress Notes (Signed)
Hematology/Oncology  Follow up note [facility] [phone] [phone]   Patient Care Team: Paulene Floor as PCP - General (Physician Assistant) Clent Jacks, RN as Oncology Nurse Navigator  REFERRING PROVIDER: Trinna Post, PA-C  CHIEF COMPLAINTS/REASON FOR VISIT:  Evaluation of abnormal CT  HISTORY OF PRESENTING ILLNESS:   Craig [name] is a  73 y.o.  male with PMH listed below, including CABG x5, PVD, hypertension, former tobacco abuse, former alcohol abuse, iron deficiency anemia, was seen in consultation at the request of  Terrilee Croak, Adriana M, PA-C  for evaluation of abnormal CT Patient recently presented to primary care provider complaining for abdominal pain radiating to his back for about 10 days.  Patient uses Tylenol as needed for pain. Work-up showed elevated amylase, lipase, mild anemia with hemoglobin of 11.3 Acute hepatitis panel negative. 05/16/2019 CT abdomen pelvis with contrast showed poorly marginated heterogeneous hypodense 2.9 cm pancreatic mass at the uncinate process, worrisome for primary pancreatic cancer.  No biliary or pancreatic duct dilation. Several ill defined hypodense liver masses scattered throughout the liver, largest 3.1 cm in the segment 6 right liver lobe. Nonspecific portacaval and aortocaval adenopathy. Extreme medial left lung base 4.4 cm pleural-based mass probably a pleural metastasis.  Additional tiny pulmonary nodules scattered at the right lung base are indeterminate. Mild prostate or megaly  Patient was referred to heme-onc for further evaluation. Patient reports that his abdominal pain was 10 out of 10 a few days ago, he uses Tylenol as needed. Today his pain is getting better, 2 out of 10.  Denies any nausea, vomiting, diarrhea, fever or chills. Married, lives with wife.  Appetite is good.  He weighs 195 pounds today at the clinic. His weight was 204 pounds back in May  2020.  He had a history of 37.5-pack-year smoking history quitting 2018. No longer drinking alcohol.  # ultrasound-guided liver biopsy on 05/21/2019.  Pathology showed fragments of benign hepatic parenchyma showing minimal macro vascular steatosis, otherwise no significant histo pathologic change. Single core of renal tissue showing approximately 25% glomerulosclerosis  # #History of CAD, status post CABG x5.  09/27/2017 echocardiogram showed LVEF 45 to 50%  INTERVAL HISTORY Craig [name] is a 73 y.o. male who has above history reviewed by me today presents for follow up for another discussion of pathology results and management plan.   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  Patient and wife came to the clinic today requesting another visit for further discussion of confirmed cancer diagnosis and management plan. Patient denies any pain today.  No new complaints.   Review of Systems  Constitutional: Positive for fatigue and unexpected weight change. Negative for appetite change, chills and fever.  HENT:   Negative for hearing loss and voice change.   Eyes: Negative for eye problems and icterus.  Respiratory: Negative for chest tightness, cough and shortness of breath.   Cardiovascular: Negative for chest pain and leg swelling.  Gastrointestinal: Negative for abdominal distention and abdominal pain.  Endocrine: Negative for hot flashes.  Genitourinary: Negative for difficulty urinating, dysuria and frequency.   Musculoskeletal: Negative for arthralgias.  Skin: Negative for itching and rash.  Neurological: Negative for light-headedness and numbness.  Hematological: Negative for adenopathy. Does not bruise/bleed easily.  Psychiatric/Behavioral: Negative for confusion.    MEDICAL HISTORY:  Past Medical History:  Diagnosis Date   Allergic rhinitis  CHF (congestive heart failure) (HCC)    Chronic cholecystitis    Coronary artery disease    DDD (degenerative disc disease), cervical    Dyspnea    Early cataract    Erectile dysfunction    GERD (gastroesophageal reflux disease)    Gouty arthritis    Headache    occasional migraines   Hypercalcemia    Hyperlipemia    Hypertension    Palpitations    Peripheral vascular disease (HCC)    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   LIMA to LAD SVG to DISTAL CIRCUMFLEX SVG SEQUENTIALLY to OM1 and OM2 SVG to ACUTE MARGINAL   Spinal stenosis of cervical region    Vitamin D deficiency     SURGICAL HISTORY: Past Surgical History:  Procedure Laterality Date   BACK SURGERY  2018    fusion with screws   CHOLECYSTECTOMY N/A 11/13/2018   Procedure: LAPAROSCOPIC CHOLECYSTECTOMY;  Surgeon: Vickie Epley, MD;  Location: [facility];  Service: General;  Laterality: N/A;   COLONOSCOPY     CORONARY ARTERY BYPASS GRAFT N/A 09/30/2017   Procedure: CORONARY ARTERY BYPASS GRAFTING times five using right and left Saphaneous vein harvested endoscopicly  and left internal mammary artery. (CABG),TEE;  Surgeon: Rexene Alberts, MD;  Location: [place];  Service: Open Heart Surgery;  Laterality: N/A;   LEFT HEART CATH AND CORONARY ANGIOGRAPHY Left 09/06/2017   Procedure: LEFT HEART CATH AND CORONARY ANGIOGRAPHY;  Surgeon: Corey Skains, MD;  Location: [place] [facility];  Service: Cardiovascular;  Laterality: Left;   percutaneous transluminal balloon angioplasty  01/2010   of left lower extremity   TEE WITHOUT CARDIOVERSION N/A 09/30/2017   Procedure: TRANSESOPHAGEAL ECHOCARDIOGRAM (TEE);  Surgeon: Rexene Alberts, MD;  Location: [place];  Service: Open Heart Surgery;  Laterality: N/A;   VASCULAR SURGERY Left 2010   left exernal iliac and superficial femoral artery PTCA and stenting    SOCIAL HISTORY: Social History   Socioeconomic History   Marital status: Married    Spouse name:  Enid Derry   Number of children: 2   Years of education: Not on file   Highest education level: Not on file  Occupational History   Occupation: drove tractors    Comment: retired  Scientist, product/process development strain: Not hard at [employer] insecurity    Worry: Never true    Inability: Never true   Transportation needs    Medical: No    Non-medical: No  Tobacco Use   Smoking status: Former Smoker    Packs/day: 0.75    Years: 50.00    Pack years: 37.50    Types: Cigarettes    Quit date: 09/27/2017    Years since quitting: 1.6   Smokeless tobacco: Former Systems developer    Types: Chew  Substance and Sexual Activity   Alcohol use: Not Currently    Comment: stopped drinking before cabg   Drug use: Not Currently    Types: Marijuana    Comment: stopped smoking before CABG   Sexual activity: Not Currently  Lifestyle   Physical activity    Days per week: 5 days    Minutes per session: 150+ min   Stress: Not at all  Relationships   Social connections    Talks on phone: More than three times a week    Gets together: More than three times a week    Attends religious service: Never    Active member of club or organization: No  Attends meetings of clubs or organizations: Never    Relationship status: Married   Intimate partner violence    Fear of current or ex partner: Not on file    Emotionally abused: Not on file    Physically abused: Not on file    Forced sexual activity: Not on file  Other Topics Concern   Not on file  Social History Narrative   Not on file    FAMILY HISTORY: Family History  Problem Relation Age of Onset   Brain cancer Mother    Emphysema Father    Cancer Brother     ALLERGIES:  is allergic to lipitor [atorvastatin]; zetia [ezetimibe]; lisinopril; protonix [pantoprazole]; and spironolactone.  MEDICATIONS:  Current Outpatient Medications  Medication Sig Dispense Refill   acetaminophen (TYLENOL) 500 MG tablet Take 2 tablets  (1,000 mg total) by mouth every 6 (six) hours. (Patient taking differently: Take 1,000 mg by mouth every 6 (six) hours as needed for moderate pain. ) 30 tablet 0   aspirin EC 81 MG tablet Take 81 mg by mouth daily.     carvedilol (COREG) 6.25 MG tablet Take 6.25 mg by mouth 2 (two) times daily with a meal.  3   diltiazem (CARDIZEM CD) 300 MG 24 hr capsule Take 1 capsule (300 mg total) by mouth daily. 90 capsule 1   dorzolamide-timolol (COSOPT) 22.3-6.8 MG/ML ophthalmic solution INSTILL ONE DROP INTO BOTH EYES TWICE DAILY     ferrous sulfate 325 (65 FE) MG tablet Take 325 mg by mouth daily with breakfast.     gabapentin (NEURONTIN) 300 MG capsule Take 2 capsules (600 mg total) by mouth 3 (three) times daily. 540 capsule 0   hydrochlorothiazide (HYDRODIURIL) 25 MG tablet Take 1 tablet (25 mg total) by mouth daily. 90 tablet 3   latanoprost (XALATAN) 0.005 % ophthalmic solution Place 1 drop into both eyes at bedtime.   3   Multiple Vitamin (MULTIVITAMIN WITH MINERALS) TABS tablet Take 1 tablet by mouth daily.     OMEGA-3 FATTY ACIDS PO Take 1,200 mg by mouth daily.      omeprazole (PRILOSEC) 40 MG capsule TAKE 1 CAPSULE BY MOUTH EVERY DAY 90 capsule 0   rosuvastatin (CRESTOR) 10 MG tablet Take 1 tablet (10 mg total) by mouth daily. 90 tablet 0   sucralfate (CARAFATE) 1 g tablet Take 1 tablet (1 g total) by mouth 4 (four) times daily -  with meals and at bedtime. 28 tablet 0   triamcinolone cream (KENALOG) 0.1 % APPLY 1 APPLICATION TOPICALLY 2 (TWO) TIMES DAILY. TO EXTERNAL EAR 30 g 0   valsartan (DIOVAN) 160 MG tablet Take 1 tablet (160 mg total) by mouth daily. 180 tablet 3   No current facility-administered medications for this visit.      PHYSICAL EXAMINATION: ECOG PERFORMANCE STATUS: 2 - Symptomatic, <50% confined to bed Vitals:   05/31/19 1613  BP: (!) 131/56  Pulse: 66  Temp: 98.3 F (36.8 C)   Filed Weights   05/31/19 1613  Weight: 196 lb (88.9 kg)    Physical  Exam Constitutional:      General: He is not in acute distress. HENT:     Head: Normocephalic and atraumatic.  Eyes:     General: No scleral icterus.    Pupils: Pupils are equal, round, and reactive to light.  Neck:     Musculoskeletal: Normal range of motion and neck supple.  Cardiovascular:     Rate and Rhythm: Normal rate and regular rhythm.  Heart sounds: Normal heart sounds.  Pulmonary:     Effort: Pulmonary effort is normal. No respiratory distress.     Breath sounds: No wheezing.  Abdominal:     General: Bowel sounds are normal. There is no distension.     Palpations: Abdomen is soft. There is no mass.     Tenderness: There is no abdominal tenderness.  Musculoskeletal: Normal range of motion.        General: No deformity.  Skin:    General: Skin is warm and dry.     Findings: No erythema or rash.  Neurological:     General: No focal deficit present.     Mental Status: He is alert and oriented to person, place, and time.     Cranial Nerves: No cranial nerve deficit.     Coordination: Coordination normal.  Psychiatric:        Behavior: Behavior normal.     LABORATORY DATA:  I have reviewed the data as listed Lab Results  Component Value Date   WBC 5.5 05/21/2019   HGB 10.1 (L) 05/21/2019   HCT 32.5 (L) 05/21/2019   MCV 85.1 05/21/2019   PLT 293 05/21/2019   Recent Labs    03/12/19 1020 05/15/19 1111 05/17/19 1212  NA 138 139 140  K 4.4 4.7 4.4  CL 102 103 106  CO2 20 19* 24  GLUCOSE 117* 111* 131*  BUN 16 23 23   CREATININE 1.21 1.27 1.36*  CALCIUM 9.7 9.9 9.4  GFRNONAA 59* 56* 52*  GFRAA 69 65 60*  PROT 7.2 6.8 8.2*  ALBUMIN 4.7 4.3 4.2  AST 19 21 31   ALT 15 18 30   ALKPHOS 77 71 67  BILITOT 0.3 <0.2 0.5   Iron/TIBC/Ferritin/ %Sat    Component Value Date/Time   IRON 119 04/27/2019 0940   TIBC 363 04/27/2019 0940   FERRITIN 58 04/27/2019 0940   IRONPCTSAT 33 04/27/2019 0940      RADIOGRAPHIC STUDIES: I have personally reviewed the  radiological images as listed and agreed with the findings in the report.  US Abdomen Complete  Result Date: 05/15/2019 CLINICAL DATA:  73 year old male with right upper quadrant abdominal pain. Cholecystectomy in 2019. EXAM: ABDOMEN ULTRASOUND COMPLETE COMPARISON:  Chest CT 05/10/2019. Ultrasound 07/24/2018. Lumbar MRI 01/15/2017. FINDINGS: Gallbladder: Surgically absent. Common bile duct: Diameter: 2 millimeters, normal. Liver: Around hypoechoic lesion in the right lobe on image 15 measures up to 20 millimeters diameter. 3 smaller round hypoechoic lesions are suggested in the left lobe (image 25) and central right lobe (image 26). These were not evident in 2019. Background liver echogenicity is within normal limits. Portal vein is patent on color Doppler imaging with normal direction of blood flow towards the liver. IVC: No abnormality visualized. Pancreas: Incompletely visualized due to overlying bowel gas, visualized portions within normal limits. Spleen: Size and appearance within normal limits. Right Kidney: Length: 11.8 centimeters. Chronic left renal lower pole cyst appears not significantly changed since 2018 and benign with no vascular elements (image 47). No right hydronephrosis. Left Kidney: Length: 12.8 centimeters. Stable small benign appearing midpole cyst since 2018, 19 millimeters. No left hydronephrosis. Abdominal aorta: No aneurysm visualized. Other findings: No free fluid. IMPRESSION: 1. Multiple new round hypoechoic liver lesions which were not evident on the 2019 ultrasound range from 8 mm to 20 mm diameter. These are nonspecific but suspicious for hepatic metastatic disease. Given that Chest CT was just recently performed on 05/10/2019, recommend follow-up CT Abdomen and Pelvis with oral and  IV contrast. 2. Otherwise negative abdomen ultrasound status post cholecystectomy. These results will be called to the ordering clinician or representative by the Radiologist Assistant, and  communication documented in the PACS or zVision Dashboard. Electronically Signed   By: Genevie Ann M.D.   On: 05/15/2019 14:02   Ct Abdomen Pelvis W Contrast  Result Date: 05/16/2019 CLINICAL DATA:  Heavy alcohol abuse. Abdominal pain for 10 days. Indeterminate hypoechoic liver lesions on recent sonogram. EXAM: CT ABDOMEN AND PELVIS WITH CONTRAST TECHNIQUE: Multidetector CT imaging of the abdomen and pelvis was performed using the standard protocol following bolus administration of intravenous contrast. CONTRAST:  180mL OMNIPAQUE IOHEXOL 300 MG/ML  SOLN COMPARISON:  05/15/2019 abdominal sonogram. FINDINGS: Lower chest: There is a 4.4 x 2.6 cm pleural based mass at the extreme medial basilar left lung base (series 2/image 12). A few scattered tiny solid pulmonary nodules at the right lung base, largest 4 mm in the right lower lobe (series 3/image 11). Coronary atherosclerosis. Visualized lower sternotomy wires are intact. Hepatobiliary: There are several (at least 8) ill-defined hypodense liver masses scattered throughout the liver, largest 3.1 x 2.7 cm in segment 6 right liver lobe (series 2/image 25) and 1.5 x 1.1 cm in the segment 7 right liver lobe (series 2/image 20). Cholecystectomy. No biliary ductal dilatation. Pancreas: There is a poorly marginated heterogeneous hypodense pancreatic mass at the uncinate process measuring approximately 2.9 x 2.7 cm (series 2/image 31). This mass abuts approximately 30-40% of the circumference of the proximal SMA posteriorly (series 2/image 29). The mass encases a tributary of the SMV (series 2/image 33). No involvement by the mass of the portosplenic venous confluence or main portal vein. No pancreatic duct dilation. Spleen: Normal size. No mass. Adrenals/Urinary Tract: Normal adrenals. Simple bilateral renal cysts, largest 4.5 cm in the lower right kidney. Scattered subcentimeter hypodense renal cortical lesions in both kidneys are too small to characterize. No  hydronephrosis. Normal bladder. Stomach/Bowel: Normal non-distended stomach. Normal caliber small bowel with no small bowel wall thickening. Normal appendix. Normal large bowel with no diverticulosis, large bowel wall thickening or pericolonic fat stranding. Vascular/Lymphatic: Atherosclerotic nonaneurysmal abdominal aorta. Patent portal, splenic, hepatic and renal veins. A few mildly enlarged portacaval nodes, largest 1.4 cm (series 2/image 23). Mildly enlarged 1.0 cm aortocaval node (series 2/image 35). No pelvic adenopathy. Reproductive: Mildly enlarged prostate. Other: No pneumoperitoneum, ascites or focal fluid collection. Musculoskeletal: No aggressive appearing focal osseous lesions. Status post posterior spinal fixation bilaterally at L4-5. Moderate thoracolumbar spondylosis. IMPRESSION: 1. Poorly marginated heterogeneous hypodense 2.9 cm pancreatic mass at the uncinate process, worrisome for primary pancreatic adenocarcinoma. No biliary or pancreatic duct dilation. MRI abdomen without and with IV contrast is indicated for further evaluation. 2. Several ill-defined hypodense liver masses scattered throughout the liver, largest 3.1 cm in the segment 6 right liver lobe, worrisome for liver metastases. 3. Nonspecific portacaval and aortocaval adenopathy, worrisome for metastatic nodes. 4. 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 Additional tiny pulmonary nodules scattered at the right lung base are indeterminate and should be reassessed on follow-up chest CT in 3 months. 5. Mild prostatomegaly. 6.  Aortic Atherosclerosis (ICD10-I70.0). These results will be called to the ordering clinician or representative by the Radiologist Assistant, and communication documented in the PACS or zVision Dashboard. Electronically Signed   By: Ilona Sorrel M.D.   On: 05/16/2019 15:13   US Biopsy (liver)  Result Date: 05/21/2019 INDICATION: Multiple liver lesions. EXAM:  ULTRASOUND DIRECTED  LIVER BIOPSY. MEDICATIONS: None. ANESTHESIA/SEDATION: Moderate (conscious) sedation was employed during this procedure. A total of Versed 4.5 mg and Fentanyl 100 mcg was administered intravenously. Moderate Sedation Time: 37 minutes. The patient's level of consciousness and vital signs were monitored continuously by radiology nursing throughout the procedure under my direct supervision. FLUOROSCOPY TIME:  Fluoroscopy Time: 0 COMPLICATIONS: None immediate. PROCEDURE: After discussing the risks and benefits of this procedure with the patient informed consent was obtained. Abdomen sterilely prepped and draped. Local anesthesia administered 1% lidocaine. IV conscious sedation performed as above. Ultrasound liver biopsy was performed with a 18 gauge needle. Two good samples obtained. No complications. Post procedure patient stable. Discharge instructions given. Follow-up with patient's physician. IMPRESSION: Successful ultrasound directed liver mass biopsy. Electronically Signed   By: Marcello Moores  Register   On: 05/21/2019 09:55   Ct Biopsy  Result Date: 05/24/2019 INDICATION: Probable liver metastasis. EXAM: CT DIRECTED LIVER MASS BIOPSY MEDICATIONS: None. ANESTHESIA/SEDATION: Moderate (conscious) sedation was employed during this procedure. A total of Versed 3.0 mg and Fentanyl 50.0 mcg was administered intravenously. Moderate Sedation Time: 15 minutes. The patient's level of consciousness and vital signs were monitored continuously by radiology nursing throughout the procedure under my direct supervision. FLUOROSCOPY TIME:  Fluoroscopy Time: 9 COMPLICATIONS: None immediate. PROCEDURE: After discussing the risks and benefits of this procedure with the patient informed consent was obtained local anesthesia administered 1% lidocaine. Under CT guidance CT-directed liver mass biopsy was performed without difficulty. Two 13 mm 18 gauge core samples obtained and sent to pathology. Post biopsy CT revealed  no complications. Patient was transferred to specials recovery. Patient was evaluated in specials recovery and noted to be stable. Discharge instructions were given to him and his wife. Follow-up is with oncology. IMPRESSION: Successful CT directed liver mass biopsy. Electronically Signed   By: Marcello Moores  Register   On: 05/24/2019 12:22   Ct Chest Lung Cancer Screening Low Dose Wo Contrast  Result Date: 05/10/2019 CLINICAL DATA:  73 year old male with 38 pack-year history of smoking. Lung cancer screening. EXAM: CT CHEST WITHOUT CONTRAST LOW-DOSE FOR LUNG CANCER SCREENING TECHNIQUE: Multidetector CT imaging of the chest was performed following the standard protocol without IV contrast. COMPARISON:  None. FINDINGS: Cardiovascular: The heart size is normal. No substantial pericardial effusion. Status post CABG. Atherosclerotic calcification is noted in the wall of the thoracic aorta. Mediastinum/Nodes: No mediastinal lymphadenopathy. No evidence for gross hilar lymphadenopathy although assessment is limited by the lack of intravenous contrast on today's study. The esophagus has normal imaging features. There is no axillary lymphadenopathy. Lungs/Pleura: Numerous tiny pulmonary nodules are seen scattered in both lungs, measuring up to a maximum volume derived equivalent diameter of 4.4 mm. No overtly suspicious nodule or mass. No pleural effusion. Centrilobular emphsyema noted. Upper Abdomen: Unremarkable. Musculoskeletal: No worrisome lytic or sclerotic osseous abnormality. Centrilobular emphsyema noted. IMPRESSION: 1. Lung-RADS 2, benign appearance or behavior. Continue annual screening with low-dose chest CT without contrast in 12 months. 2.  Aortic Atherosclerois (ICD10-170.0) 3.  Emphysema. [id number]) Electronically Signed   By: Misty Stanley M.D.   On: 05/10/2019 17:31      ASSESSMENT & PLAN:  1. Pancreatic cancer metastasized to liver ([place])   2. Goals of care, counseling/discussion   3. Liver  metastasis ([place])    # The diagnosis of stage IV pancreatic cancer and care plan were discussed with patient in detail.   Per patient's request, also discussed with wife virtually. Plan to obtain PET scan to complete staging. NCCN guidelines were  reviewed and shared with patient.    #Goals of care the goal of treatment which is to palliate disease, disease related symptoms, improve quality of life and hopefully prolong life was highlighted in our discussion.  Chemotherapy education was provided.  We had discussed the composition of chemotherapy regimen, length of chemo cycle, duration of treatment and the time to assess response to treatment.    I explained to the patient the risks and benefits of chemotherapy gemcitabine and Abraxane including all but not limited to hair loss, mouth sore, nausea, vomiting, diarrhea, low blood counts, bleeding, neuropathy and risk of life threatening infection and even death, secondary malignancy etc.  . Patient and wife voice understanding and willing to proceed chemotherapy.   # Chemotherapy education; refer to vascular surgery Medi- port placement. Antiemetics-Zofran and Compazine; EMLA cream sent to pharmacy  #Refer to palliative care  Supportive care measures are necessary for patient well-being and will be provided as necessary. We spent sufficient time to discuss many aspect of care, questions were answered to patient's satisfaction.  Orders Placed This Encounter  Procedures   NM PET Image Initial (PI) Skull Base To Thigh    Standing Status:   Future    Standing Expiration Date:   05/30/2020    Order Specific Question:   If indicated for the ordered procedure, I authorize the administration of a radiopharmaceutical per Radiology protocol    Answer:   Yes    Order Specific Question:   Preferred imaging location?    Answer:   [facility]    Order Specific Question:   Radiology Contrast Protocol - do NOT remove file path    Answer:    [email or web address]   Ambulatory referral to Vascular Surgery    Referral Priority:   Routine    Referral Type:   Surgical    Referral Reason:   Specialty Services Required    Referred to Provider:   Algernon Huxley, MD    Requested Specialty:   Vascular Surgery    Number of Visits Requested:   1    All questions were answered. The patient knows to call the clinic with any problems questions or concerns.  cc Trinna Post, PA-C    Return of visit: Day 1 of chemotherapy for assessment.  Earlie Server, MD, PhD Hematology Oncology [place] [facility] 06/01/2019

## 2019-06-01 NOTE — Progress Notes (Signed)
START ON PATHWAY REGIMEN - Pancreatic Adenocarcinoma     A cycle is every 28 days:     Nab-paclitaxel (protein bound)      Gemcitabine   **Always confirm dose/schedule in your pharmacy ordering system**  Patient Characteristics: Metastatic Disease, First Line, PS  ?  2, BRCA1/2 and PALB2 Mutation Absent/Unknown Current evidence of distant metastases<= Yes AJCC T Category: T2 AJCC N Category: NX AJCC M Category: M1 AJCC 8 Stage Grouping: IV Line of Therapy: First Line ECOG Performance Status: 2 BRCA1/2 Mutation Status: Awaiting Test Results PALB2 Mutation Status: Awaiting Test Results Intent of Therapy: Non-Curative / Palliative Intent, Discussed with Patient

## 2019-06-01 NOTE — Telephone Encounter (Signed)
I called and got voice mail and left message to call back with specific questions

## 2019-06-01 NOTE — Progress Notes (Signed)
[facility]  Telephone:([phone] [phone]   Name: Stylianos Stradling Date: 06/01/2019 MRN: [id number]  DOB: March 27, 1946  Patient Care Team: Paulene Floor as PCP - General (Physician Assistant) Clent Jacks, RN as Oncology Nurse Navigator    REASON FOR CONSULTATION: Palliative Care consult requested for this 73 y.o. 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  Patient is being initiated on chemotherapy with gemcitabine and Abraxane.  He was referred to palliative care to help address goals and manage ongoing symptoms.   SOCIAL HISTORY:     reports that he quit smoking about 20 months ago. His smoking use included cigarettes. He has a 37.50 pack-year smoking history. He has quit using smokeless tobacco.  His smokeless tobacco use included chew. He reports previous alcohol use. He reports previous drug use. Drug: Marijuana.   Patient is married and lives at home with his wife.  He has a son and a daughter but reports that he is estranged from both.  He had another daughter who is now deceased.  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  Note that patient is illiterate.  He also says that his wife often will not answer the phone and recommends that we call and leave a message on the answering machine and then call her right back.  ADVANCE DIRECTIVES:  Does not have  CODE STATUS:   PAST MEDICAL HISTORY: Past Medical History:  Diagnosis Date   Allergic rhinitis    CHF (congestive heart failure) (HCC)    Chronic cholecystitis    Coronary artery disease    DDD (degenerative disc disease), cervical    Dyspnea    Early cataract    Erectile dysfunction    GERD (gastroesophageal reflux disease)    Gouty arthritis     Headache    occasional migraines   Hypercalcemia    Hyperlipemia    Hypertension    Palpitations    Peripheral vascular disease (HCC)    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   LIMA to LAD SVG to DISTAL CIRCUMFLEX SVG SEQUENTIALLY to OM1 and OM2 SVG to ACUTE MARGINAL   Spinal stenosis of cervical region    Vitamin D deficiency     PAST SURGICAL HISTORY:  Past Surgical History:  Procedure Laterality Date   BACK SURGERY  2018    fusion with screws   CHOLECYSTECTOMY N/A 11/13/2018   Procedure: LAPAROSCOPIC CHOLECYSTECTOMY;  Surgeon: Vickie Epley, MD;  Location: [facility];  Service: General;  Laterality: N/A;   COLONOSCOPY     CORONARY ARTERY BYPASS GRAFT N/A 09/30/2017   Procedure: CORONARY ARTERY BYPASS GRAFTING times five using right and left Saphaneous vein harvested endoscopicly  and left internal mammary artery. (CABG),TEE;  Surgeon: Rexene Alberts, MD;  Location: [place];  Service: Open Heart Surgery;  Laterality: N/A;   LEFT HEART CATH AND CORONARY ANGIOGRAPHY Left 09/06/2017   Procedure: LEFT HEART CATH AND CORONARY ANGIOGRAPHY;  Surgeon: Corey Skains, MD;  Location: [place] [facility];  Service: Cardiovascular;  Laterality: Left;   percutaneous transluminal balloon angioplasty  01/2010   of left lower extremity   TEE WITHOUT CARDIOVERSION N/A 09/30/2017   Procedure: TRANSESOPHAGEAL ECHOCARDIOGRAM (TEE);  Surgeon: Rexene Alberts, MD;  Location: [place];  Service: Open Heart  Surgery;  Laterality: N/A;   VASCULAR SURGERY Left 2010   left exernal iliac and superficial femoral artery PTCA and stenting    HEMATOLOGY/ONCOLOGY HISTORY:  Oncology History  Pancreatic cancer metastasized to liver ([place])  06/01/2019 Initial Diagnosis   Pancreatic cancer metastasized to liver ([place])   06/07/2019 -  Chemotherapy   The patient had PACLitaxel-protein bound (ABRAXANE) chemo infusion 250 mg, 125 mg/m2, Intravenous,  Once, 0 of 6 cycles gemcitabine (GEMZAR) 2,090 mg in sodium  chloride 0.9 % 250 mL chemo infusion, 1,000 mg/m2, Intravenous,  Once, 0 of 6 cycles  for chemotherapy treatment.      ALLERGIES:  is allergic to lipitor [atorvastatin]; zetia [ezetimibe]; lisinopril; protonix [pantoprazole]; and spironolactone.  MEDICATIONS:  Current Outpatient Medications  Medication Sig Dispense Refill   acetaminophen (TYLENOL) 500 MG tablet Take 2 tablets (1,000 mg total) by mouth every 6 (six) hours. (Patient taking differently: Take 1,000 mg by mouth every 6 (six) hours as needed for moderate pain. ) 30 tablet 0   aspirin EC 81 MG tablet Take 81 mg by mouth daily.     carvedilol (COREG) 6.25 MG tablet Take 6.25 mg by mouth 2 (two) times daily with a meal.  3   diltiazem (CARDIZEM CD) 300 MG 24 hr capsule Take 1 capsule (300 mg total) by mouth daily. 90 capsule 1   dorzolamide-timolol (COSOPT) 22.3-6.8 MG/ML ophthalmic solution INSTILL ONE DROP INTO BOTH EYES TWICE DAILY     ferrous sulfate 325 (65 FE) MG tablet Take 325 mg by mouth daily with breakfast.     gabapentin (NEURONTIN) 300 MG capsule Take 2 capsules (600 mg total) by mouth 3 (three) times daily. 540 capsule 0   hydrochlorothiazide (HYDRODIURIL) 25 MG tablet Take 1 tablet (25 mg total) by mouth daily. 90 tablet 3   latanoprost (XALATAN) 0.005 % ophthalmic solution Place 1 drop into both eyes at bedtime.   3   lidocaine-prilocaine (EMLA) cream Apply to affected area once 30 g 3   Multiple Vitamin (MULTIVITAMIN WITH MINERALS) TABS tablet Take 1 tablet by mouth daily.     OMEGA-3 FATTY ACIDS PO Take 1,200 mg by mouth daily.      omeprazole (PRILOSEC) 40 MG capsule TAKE 1 CAPSULE BY MOUTH EVERY DAY 90 capsule 0   ondansetron (ZOFRAN) 8 MG tablet Take 1 tablet (8 mg total) by mouth 2 (two) times daily as needed (Nausea or vomiting). 30 tablet 1   prochlorperazine (COMPAZINE) 10 MG tablet Take 1 tablet (10 mg total) by mouth every 6 (six) hours as needed (Nausea or vomiting). 30 tablet 1    rosuvastatin (CRESTOR) 10 MG tablet Take 1 tablet (10 mg total) by mouth daily. 90 tablet 0   sucralfate (CARAFATE) 1 g tablet Take 1 tablet (1 g total) by mouth 4 (four) times daily -  with meals and at bedtime. 28 tablet 0   triamcinolone cream (KENALOG) 0.1 % APPLY 1 APPLICATION TOPICALLY 2 (TWO) TIMES DAILY. TO EXTERNAL EAR 30 g 0   valsartan (DIOVAN) 160 MG tablet Take 1 tablet (160 mg total) by mouth daily. 180 tablet 3   No current facility-administered medications for this visit.     VITAL SIGNS: There were no vitals taken for this visit. There were no vitals filed for this visit.  Estimated body mass index is 28.94 kg/m as calculated from the following:   Height as of 05/24/19: '5\' 9"'$  (1.753 m).   Weight as of 05/31/19: 196 lb (88.9 kg).  LABS: CBC:  Component Value Date/Time   WBC 5.5 05/21/2019 0744   HGB 10.1 (L) 05/21/2019 0744   HGB 11.2 (L) 05/15/2019 1111   HCT 32.5 (L) 05/21/2019 0744   HCT 32.7 (L) 05/15/2019 1111   PLT 293 05/21/2019 0744   PLT 277 05/15/2019 1111   MCV 85.1 05/21/2019 0744   MCV 80 05/15/2019 1111   NEUTROABS 3.7 05/17/2019 1212   NEUTROABS 3.5 05/15/2019 1111   LYMPHSABS 1.2 05/17/2019 1212   LYMPHSABS 1.2 05/15/2019 1111   MONOABS 0.4 05/17/2019 1212   EOSABS 0.1 05/17/2019 1212   EOSABS 0.2 05/15/2019 1111   BASOSABS 0.0 05/17/2019 1212   BASOSABS 0.1 05/15/2019 1111   Comprehensive Metabolic Panel:    Component Value Date/Time   NA 140 05/17/2019 1212   NA 139 05/15/2019 1111   K 4.4 05/17/2019 1212   CL 106 05/17/2019 1212   CO2 24 05/17/2019 1212   BUN 23 05/17/2019 1212   BUN 23 05/15/2019 1111   CREATININE 1.36 (H) 05/17/2019 1212   CREATININE 0.95 06/17/2017 1035   GLUCOSE 131 (H) 05/17/2019 1212   CALCIUM 9.4 05/17/2019 1212   AST 31 05/17/2019 1212   ALT 30 05/17/2019 1212   ALKPHOS 67 05/17/2019 1212   BILITOT 0.5 05/17/2019 1212   BILITOT <0.2 05/15/2019 1111   PROT 8.2 (H) 05/17/2019 1212   PROT 6.8  05/15/2019 1111   ALBUMIN 4.2 05/17/2019 1212   ALBUMIN 4.3 05/15/2019 1111    RADIOGRAPHIC STUDIES: US Abdomen Complete  Result Date: 05/15/2019 CLINICAL DATA:  73 year old male with right upper quadrant abdominal pain. Cholecystectomy in 2019. EXAM: ABDOMEN ULTRASOUND COMPLETE COMPARISON:  Chest CT 05/10/2019. Ultrasound 07/24/2018. Lumbar MRI 01/15/2017. FINDINGS: Gallbladder: Surgically absent. Common bile duct: Diameter: 2 millimeters, normal. Liver: Around hypoechoic lesion in the right lobe on image 15 measures up to 20 millimeters diameter. 3 smaller round hypoechoic lesions are suggested in the left lobe (image 25) and central right lobe (image 26). These were not evident in 2019. Background liver echogenicity is within normal limits. Portal vein is patent on color Doppler imaging with normal direction of blood flow towards the liver. IVC: No abnormality visualized. Pancreas: Incompletely visualized due to overlying bowel gas, visualized portions within normal limits. Spleen: Size and appearance within normal limits. Right Kidney: Length: 11.8 centimeters. Chronic left renal lower pole cyst appears not significantly changed since 2018 and benign with no vascular elements (image 47). No right hydronephrosis. Left Kidney: Length: 12.8 centimeters. Stable small benign appearing midpole cyst since 2018, 19 millimeters. No left hydronephrosis. Abdominal aorta: No aneurysm visualized. Other findings: No free fluid. IMPRESSION: 1. Multiple new round hypoechoic liver lesions which were not evident on the 2019 ultrasound range from 8 mm to 20 mm diameter. These are nonspecific but suspicious for hepatic metastatic disease. Given that Chest CT was just recently performed on 05/10/2019, recommend follow-up CT Abdomen and Pelvis with oral and IV contrast. 2. Otherwise negative abdomen ultrasound status post cholecystectomy. These results will be called to the ordering clinician or representative by the  Radiologist Assistant, and communication documented in the PACS or zVision Dashboard. Electronically Signed   By: Genevie Ann M.D.   On: 05/15/2019 14:02   Ct Abdomen Pelvis W Contrast  Result Date: 05/16/2019 CLINICAL DATA:  Heavy alcohol abuse. Abdominal pain for 10 days. Indeterminate hypoechoic liver lesions on recent sonogram. EXAM: CT ABDOMEN AND PELVIS WITH CONTRAST TECHNIQUE: Multidetector CT imaging of the abdomen and pelvis was performed using the standard protocol following  bolus administration of intravenous contrast. CONTRAST:  161m OMNIPAQUE IOHEXOL 300 MG/ML  SOLN COMPARISON:  05/15/2019 abdominal sonogram. FINDINGS: Lower chest: There is a 4.4 x 2.6 cm pleural based mass at the extreme medial basilar left lung base (series 2/image 12). A few scattered tiny solid pulmonary nodules at the right lung base, largest 4 mm in the right lower lobe (series 3/image 11). Coronary atherosclerosis. Visualized lower sternotomy wires are intact. Hepatobiliary: There are several (at least 8) ill-defined hypodense liver masses scattered throughout the liver, largest 3.1 x 2.7 cm in segment 6 right liver lobe (series 2/image 25) and 1.5 x 1.1 cm in the segment 7 right liver lobe (series 2/image 20). Cholecystectomy. No biliary ductal dilatation. Pancreas: There is a poorly marginated heterogeneous hypodense pancreatic mass at the uncinate process measuring approximately 2.9 x 2.7 cm (series 2/image 31). This mass abuts approximately 30-40% of the circumference of the proximal SMA posteriorly (series 2/image 29). The mass encases a tributary of the SMV (series 2/image 33). No involvement by the mass of the portosplenic venous confluence or main portal vein. No pancreatic duct dilation. Spleen: Normal size. No mass. Adrenals/Urinary Tract: Normal adrenals. Simple bilateral renal cysts, largest 4.5 cm in the lower right kidney. Scattered subcentimeter hypodense renal cortical lesions in both kidneys are too small to  characterize. No hydronephrosis. Normal bladder. Stomach/Bowel: Normal non-distended stomach. Normal caliber small bowel with no small bowel wall thickening. Normal appendix. Normal large bowel with no diverticulosis, large bowel wall thickening or pericolonic fat stranding. Vascular/Lymphatic: Atherosclerotic nonaneurysmal abdominal aorta. Patent portal, splenic, hepatic and renal veins. A few mildly enlarged portacaval nodes, largest 1.4 cm (series 2/image 23). Mildly enlarged 1.0 cm aortocaval node (series 2/image 35). No pelvic adenopathy. Reproductive: Mildly enlarged prostate. Other: No pneumoperitoneum, ascites or focal fluid collection. Musculoskeletal: No aggressive appearing focal osseous lesions. Status post posterior spinal fixation bilaterally at L4-5. Moderate thoracolumbar spondylosis. IMPRESSION: 1. Poorly marginated heterogeneous hypodense 2.9 cm pancreatic mass at the uncinate process, worrisome for primary pancreatic adenocarcinoma. No biliary or pancreatic duct dilation. MRI abdomen without and with IV contrast is indicated for further evaluation. 2. Several ill-defined hypodense liver masses scattered throughout the liver, largest 3.1 cm in the segment 6 right liver lobe, worrisome for liver metastases. 3. Nonspecific portacaval and aortocaval adenopathy, worrisome for metastatic nodes. 4. 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 Additional tiny pulmonary nodules scattered at the right lung base are indeterminate and should be reassessed on follow-up chest CT in 3 months. 5. Mild prostatomegaly. 6.  Aortic Atherosclerosis (ICD10-I70.0). These results will be called to the ordering clinician or representative by the Radiologist Assistant, and communication documented in the PACS or zVision Dashboard. Electronically Signed   By: JIlona SorrelM.D.   On: 05/16/2019 15:13   [place] (liver[place]  Result Date: 05/21/2019 INDICATION: Multiple liver  lesions. EXAM: ULTRASOUND DIRECTED LIVER BIOPSY. MEDICATIONS: None. ANESTHESIA/SEDATION: Moderate (conscious) sedation was employed during this procedure. A total of Versed 4.5 mg and Fentanyl 100 mcg was administered intravenously. Moderate Sedation Time: 37 minutes. The patient's level of consciousness and vital signs were monitored continuously by radiology nursing throughout the procedure under my direct supervision. FLUOROSCOPY TIME:  Fluoroscopy Time: 0 COMPLICATIONS: None immediate. PROCEDURE: After discussing the risks and benefits of this procedure with the patient informed consent was obtained. Abdomen sterilely prepped and draped. Local anesthesia administered 1% lidocaine. IV conscious sedation performed as above. Ultrasound liver biopsy was performed with a  18 gauge needle. Two good samples obtained. No complications. Post procedure patient stable. Discharge instructions given. Follow-up with patient's physician. IMPRESSION: Successful ultrasound directed liver mass biopsy. Electronically Signed   By: Marcello Moores  Register   On: 05/21/2019 09:55   Ct Biopsy  Result Date: 05/24/2019 INDICATION: Probable liver metastasis. EXAM: CT DIRECTED LIVER MASS BIOPSY MEDICATIONS: None. ANESTHESIA/SEDATION: Moderate (conscious) sedation was employed during this procedure. A total of Versed 3.0 mg and Fentanyl 50.0 mcg was administered intravenously. Moderate Sedation Time: 15 minutes. The patient's level of consciousness and vital signs were monitored continuously by radiology nursing throughout the procedure under my direct supervision. FLUOROSCOPY TIME:  Fluoroscopy Time: 9 COMPLICATIONS: None immediate. PROCEDURE: After discussing the risks and benefits of this procedure with the patient informed consent was obtained local anesthesia administered 1% lidocaine. Under CT guidance CT-directed liver mass biopsy was performed without difficulty. Two 13 mm 18 gauge core samples obtained and sent to pathology. Post  biopsy CT revealed no complications. Patient was transferred to specials recovery. Patient was evaluated in specials recovery and noted to be stable. Discharge instructions were given to him and his wife. Follow-up is with oncology. IMPRESSION: Successful CT directed liver mass biopsy. Electronically Signed   By: Marcello Moores  Register   On: 05/24/2019 12:22   Ct Chest Lung Cancer Screening Low Dose Wo Contrast  Result Date: 05/10/2019 CLINICAL DATA:  73 year old male with 38 pack-year history of smoking. Lung cancer screening. EXAM: CT CHEST WITHOUT CONTRAST LOW-DOSE FOR LUNG CANCER SCREENING TECHNIQUE: Multidetector CT imaging of the chest was performed following the standard protocol without IV contrast. COMPARISON:  None. FINDINGS: Cardiovascular: The heart size is normal. No substantial pericardial effusion. Status post CABG. Atherosclerotic calcification is noted in the wall of the thoracic aorta. Mediastinum/Nodes: No mediastinal lymphadenopathy. No evidence for gross hilar lymphadenopathy although assessment is limited by the lack of intravenous contrast on today's study. The esophagus has normal imaging features. There is no axillary lymphadenopathy. Lungs/Pleura: Numerous tiny pulmonary nodules are seen scattered in both lungs, measuring up to a maximum volume derived equivalent diameter of 4.4 mm. No overtly suspicious nodule or mass. No pleural effusion. Centrilobular emphsyema noted. Upper Abdomen: Unremarkable. Musculoskeletal: No worrisome lytic or sclerotic osseous abnormality. Centrilobular emphsyema noted. IMPRESSION: 1. Lung-RADS 2, benign appearance or behavior. Continue annual screening with low-dose chest CT without contrast in 12 months. 2.  Aortic Atherosclerois (ICD10-170.0) 3.  Emphysema. (ION62-X52.9) Electronically Signed   By: Misty Stanley M.D.   On: 05/10/2019 17:31    PERFORMANCE STATUS (ECOG) : 0 - Asymptomatic  Review of Systems Unless otherwise noted, a complete review of  systems is negative.  Physical Exam General: NAD, frail appearing, thin Cardiovascular: regular rate and rhythm Pulmonary: clear ant fields Abdomen: soft, nontender, + bowel sounds GU: no suprapubic tenderness Extremities: no edema, no joint deformities Skin: no rashes Neurological: Weakness but otherwise nonfocal  IMPRESSION: I met with patient today following his chemo education class.  Patient says that he was struggling to concentrate today.  We agreed that we would have a more in-depth conversation at a later time regarding his goals.  Introduced palliative care services and attempted to establish therapeutic rapport.  Patient denies any distressing symptoms.  He reports doing well.  He plans to leave the cancer center today and "piddle around" repairing an engine.   Patient says that he recognizes that the cancer will be terminal.  He cites Dr. Tasia Catchings giving him a prognosis of 3 months without treatment or 6  months to a year with treatment.  Patient says he would like to try treatment.  I attempted to review with patient a MOST Form.  Note that patient is illiterate.  He plans to take the form home to show his wife and discuss options.  I asked if I could call his wife but patient says that she would not answer the phone.  Case and plan discussed with Dr. Tasia Catchings.  PLAN: -Continue current scope of treatment -MOST Form discussed -RTC in 2 weeks   Patient expressed understanding and was in agreement with this plan. He also understands that He can call the clinic at any time with any questions, concerns, or complaints.     Time Total: 15 minutes  Visit consisted of counseling and education dealing with the complex and emotionally intense issues of symptom management and palliative care in the setting of serious and potentially life-threatening illness.Greater than 50%  of this time was spent counseling and coordinating care related to the above assessment and plan.  Signed  by: Altha Harm, PhD, NP-C [phone] (Work Cell)

## 2019-06-02 LAB — SARS CORONAVIRUS 2 (TAT 6-24 HRS): SARS Coronavirus 2: NEGATIVE

## 2019-06-04 ENCOUNTER — Ambulatory Visit: Payer: Medicare Other | Admitting: Oncology

## 2019-06-04 ENCOUNTER — Other Ambulatory Visit: Payer: Self-pay

## 2019-06-04 ENCOUNTER — Encounter
Admission: RE | Admit: 2019-06-04 | Discharge: 2019-06-04 | Disposition: A | Discharge status: Home / Self Care | Payer: Medicare Other | Source: Ambulatory Visit | Attending: Oncology | Admitting: Oncology

## 2019-06-04 DIAGNOSIS — R918 Other nonspecific abnormal finding of lung field: Secondary | ICD-10-CM | POA: Insufficient documentation

## 2019-06-04 DIAGNOSIS — M899 Disorder of bone, unspecified: Secondary | ICD-10-CM | POA: Insufficient documentation

## 2019-06-04 DIAGNOSIS — I700 Atherosclerosis of aorta: Secondary | ICD-10-CM | POA: Insufficient documentation

## 2019-06-04 DIAGNOSIS — C801 Malignant (primary) neoplasm, unspecified: Secondary | ICD-10-CM | POA: Insufficient documentation

## 2019-06-04 DIAGNOSIS — D490 Neoplasm of unspecified behavior of digestive system: Secondary | ICD-10-CM | POA: Insufficient documentation

## 2019-06-04 DIAGNOSIS — C778 Secondary and unspecified malignant neoplasm of lymph nodes of multiple regions: Secondary | ICD-10-CM | POA: Insufficient documentation

## 2019-06-04 DIAGNOSIS — C787 Secondary malignant neoplasm of liver and intrahepatic bile duct: Secondary | ICD-10-CM | POA: Insufficient documentation

## 2019-06-04 DIAGNOSIS — Z7189 Other specified counseling: Secondary | ICD-10-CM

## 2019-06-04 DIAGNOSIS — Z79899 Other long term (current) drug therapy: Secondary | ICD-10-CM | POA: Insufficient documentation

## 2019-06-04 LAB — GLUCOSE, CAPILLARY: Glucose-Capillary: 123 mg/dL — ABNORMAL HIGH (ref 70–99)

## 2019-06-04 MED ORDER — FLUDEOXYGLUCOSE F - 18 (FDG) INJECTION
10.9800 | Freq: Once | INTRAVENOUS | Status: AC | PRN
  Administered 2019-06-04: 10.98 via INTRAVENOUS

## 2019-06-06 ENCOUNTER — Encounter: Payer: Self-pay | Admitting: Certified Registered Nurse Anesthetist

## 2019-06-06 ENCOUNTER — Other Ambulatory Visit: Payer: Self-pay

## 2019-06-06 ENCOUNTER — Other Ambulatory Visit (INDEPENDENT_AMBULATORY_CARE_PROVIDER_SITE_OTHER): Payer: Self-pay | Admitting: Nurse Practitioner

## 2019-06-06 ENCOUNTER — Encounter
Admission: RE | Disposition: A | Discharge status: Home / Self Care | Payer: Self-pay | Source: Home / Self Care | Attending: Vascular Surgery

## 2019-06-06 ENCOUNTER — Ambulatory Visit
Admission: RE | Admit: 2019-06-06 | Discharge: 2019-06-06 | Disposition: A | Discharge status: Home / Self Care | Payer: Medicare Other | Attending: Vascular Surgery | Admitting: Vascular Surgery

## 2019-06-06 DIAGNOSIS — C259 Malignant neoplasm of pancreas, unspecified: Secondary | ICD-10-CM | POA: Insufficient documentation

## 2019-06-06 DIAGNOSIS — Z9221 Personal history of antineoplastic chemotherapy: Secondary | ICD-10-CM | POA: Insufficient documentation

## 2019-06-06 DIAGNOSIS — D490 Neoplasm of unspecified behavior of digestive system: Secondary | ICD-10-CM

## 2019-06-06 HISTORY — PX: PORTA CATH INSERTION: CATH118285

## 2019-06-06 SURGERY — PORTA CATH INSERTION
Anesthesia: Moderate Sedation

## 2019-06-06 MED ORDER — METHYLPREDNISOLONE SODIUM SUCC 125 MG IJ SOLR: 125.0000 mg | Freq: Once | INTRAMUSCULAR | Status: DC | PRN

## 2019-06-06 MED ORDER — FENTANYL CITRATE (PF) 100 MCG/2ML IJ SOLN
INTRAMUSCULAR | Status: AC
  Filled 2019-06-06: qty 4

## 2019-06-06 MED ORDER — MIDAZOLAM HCL 2 MG/2ML IJ SOLN
INTRAMUSCULAR | Status: AC
  Filled 2019-06-06: qty 2

## 2019-06-06 MED ORDER — MIDAZOLAM HCL 2 MG/2ML IJ SOLN
INTRAMUSCULAR | Status: AC
  Filled 2019-06-06: qty 4

## 2019-06-06 MED ORDER — DIPHENHYDRAMINE HCL 50 MG/ML IJ SOLN: 50.0000 mg | Freq: Once | INTRAMUSCULAR | Status: DC | PRN

## 2019-06-06 MED ORDER — FENTANYL CITRATE (PF) 100 MCG/2ML IJ SOLN
INTRAMUSCULAR | Status: DC | PRN
  Administered 2019-06-06 (×4): 50 ug via INTRAVENOUS

## 2019-06-06 MED ORDER — FAMOTIDINE 20 MG PO TABS: 40.0000 mg | ORAL_TABLET | Freq: Once | ORAL | Status: DC | PRN

## 2019-06-06 MED ORDER — DIPHENHYDRAMINE HCL 50 MG/ML IJ SOLN
INTRAMUSCULAR | Status: AC
  Filled 2019-06-06: qty 1

## 2019-06-06 MED ORDER — HYDROMORPHONE HCL 1 MG/ML IJ SOLN: 1.0000 mg | Freq: Once | INTRAMUSCULAR | Status: DC | PRN

## 2019-06-06 MED ORDER — CEFAZOLIN SODIUM-DEXTROSE 2-4 GM/100ML-% IV SOLN
2.0000 g | Freq: Once | INTRAVENOUS | Status: AC
  Administered 2019-06-06: 2 g via INTRAVENOUS

## 2019-06-06 MED ORDER — SODIUM CHLORIDE 0.9 % IV SOLN: INTRAVENOUS | Status: DC

## 2019-06-06 MED ORDER — MIDAZOLAM HCL 2 MG/2ML IJ SOLN
INTRAMUSCULAR | Status: DC | PRN
  Administered 2019-06-06: 1 mg via INTRAVENOUS
  Administered 2019-06-06: 2 mg via INTRAVENOUS
  Administered 2019-06-06: 1 mg via INTRAVENOUS

## 2019-06-06 MED ORDER — SODIUM CHLORIDE 0.9 % IV SOLN
Freq: Once | INTRAVENOUS | Status: DC
  Filled 2019-06-06: qty 2

## 2019-06-06 MED ORDER — CEFAZOLIN SODIUM-DEXTROSE 2-4 GM/100ML-% IV SOLN
INTRAVENOUS | Status: AC
  Filled 2019-06-06: qty 100

## 2019-06-06 MED ORDER — MIDAZOLAM HCL 2 MG/ML PO SYRP: 8.0000 mg | ORAL_SOLUTION | Freq: Once | ORAL | Status: DC | PRN

## 2019-06-06 MED ORDER — ONDANSETRON HCL 4 MG/2ML IJ SOLN: 4.0000 mg | Freq: Four times a day (QID) | INTRAMUSCULAR | Status: DC | PRN

## 2019-06-06 SURGICAL SUPPLY — 18 items
ADH SKN CLS APL DERMABOND .7 (GAUZE/BANDAGES/DRESSINGS) ×1
APL PRP STRL LF DISP 70% ISPRP (MISCELLANEOUS) ×1
CHLORAPREP W/TINT 26 (MISCELLANEOUS) ×1 IMPLANT
DERMABOND ADVANCED (GAUZE/BANDAGES/DRESSINGS) ×1
DERMABOND ADVANCED .7 DNX12 (GAUZE/BANDAGES/DRESSINGS) IMPLANT
DRAPE INCISE IOBAN 66X45 STRL (DRAPES) ×2 IMPLANT
KIT PORT POWER 8FR ISP CVUE (Port) ×1 IMPLANT
NDL ENTRY 21GA 7CM ECHOTIP (NEEDLE) IMPLANT
NEEDLE ENTRY 21GA 7CM ECHOTIP (NEEDLE) ×2 IMPLANT
PACK ANGIOGRAPHY (CUSTOM PROCEDURE TRAY) ×2 IMPLANT
PAD GROUND ADULT SPLIT (MISCELLANEOUS) ×1 IMPLANT
PENCIL ELECTRO HAND CTR (MISCELLANEOUS) ×1 IMPLANT
SET INTRO CAPELLA COAXIAL (SET/KITS/TRAYS/PACK) ×1 IMPLANT
SPONGE XRAY 4X4 16PLY STRL (MISCELLANEOUS) ×1 IMPLANT
SUT MNCRL AB 4-0 PS2 18 (SUTURE) ×2 IMPLANT
SUT PROLENE 0 CT 1 30 (SUTURE) ×2 IMPLANT
SUT VICRYL+ 3-0 36IN CT-1 (SUTURE) ×1 IMPLANT
TOWEL OR 17X26 4PK STRL BLUE (TOWEL DISPOSABLE) ×1 IMPLANT

## 2019-06-06 NOTE — Op Note (Signed)
OPERATIVE NOTE   PROCEDURE: 1. Placement of a right IJ Infuse-a-Port  PRE-OPERATIVE DIAGNOSIS: Pancreatic cancer  POST-OPERATIVE DIAGNOSIS: Same  SURGEON: Katha Cabal M.D.  ANESTHESIA: Conscious sedation was administered under my direct supervision by the interventional radiology RN. IV Versed plus fentanyl were utilized. Continuous ECG, pulse oximetry and blood pressure was monitored throughout the entire procedure. Conscious sedation was for a total of 30 minutes.  ESTIMATED BLOOD LOSS: Minimal   FINDING(S): 1.  Patent vein  SPECIMEN(S): None  INDICATIONS:   Craig [name] is a 73 y.o. male who presents with pancreatic cancer metastatic.  He is undergoing chemotherapy.  He therefore needs appropriate parenteral access.  Risks and benefits of been reviewed and the patient wishes to have a port placed..  DESCRIPTION: After obtaining full informed written consent, the patient was brought back to the special procedure suite and placed in the supine position. The patient's right neck and chest wall are prepped and draped in sterile fashion. Appropriate timeout was called.  Ultrasound is placed in a sterile sleeve, ultrasound is utilized to avoid vascular injury as well as secondary to lack of appropriate landmarks. The right internal jugular vein is identified. It is echolucent and homogeneous as well as easily compressible indicating patency. An image is recorded for the permanent record.  Access to the vein with a micropuncture needle is done under direct ultrasound visualization.  1% lidocaine is infiltrated into the soft tissue at the base of the neck as well as on the chest wall.  Under direct ultrasound visualization a micro-needle is inserted into the vein followed by the micro-wire. Micro-sheath was then advanced and a J wire is inserted without difficulty under fluoroscopic guidance. A small counterincision was created at the wire insertion site. A transverse incision is  created 2 fingerbreadths below the scapula and a pocket is fashioned using both blunt and sharp dissection. The pocket is tested for appropriate size with the hub of the Infuse-a-Port. The tunneling device is then used to pull the intravascular portion of the catheter from the pocket to the neck counterincision.  Dilator and peel-away sheath were then inserted over the wire and the wire is removed. Catheter is then advanced into the venous system without difficulty. Peel-away sheath was then removed.  Catheter is then positioned under fluoroscopic guidance at the atrial caval junction. It is then transected connected to the hub and the hope is slipped into the subcutaneous pocket on the chest wall. The hub was then accessed percutaneously and aspirates easily and flushes well and is flushed with 30 cc of heparinized saline. The pocket incision is then closed in layers using interrupted 3-0 Vicryl for the subcutaneous tissues and 4-0 Monocryl subcuticular for skin closure. Dermabond is applied. The neck counterincision was closed with 4-0 Monocryl subcuticular and Dermabond as well.  The patient tolerated the procedure well and there were no immediate complications.  COMPLICATIONS: None  CONDITION: Unchanged  Katha Cabal M.D. [facility] Office: [phone]   06/06/2019, 1:16 PM

## 2019-06-06 NOTE — H&P (Signed)
[facility] History & Physical Update  The patient was interviewed and re-examined.  The patient's previous History and Physical has been reviewed and is unchanged.  There is no change in the plan of care. We plan to proceed with the scheduled procedure.  Hortencia Pilar, MD  06/06/2019, 11:58 AM

## 2019-06-07 ENCOUNTER — Other Ambulatory Visit: Payer: Self-pay | Admitting: Oncology

## 2019-06-07 ENCOUNTER — Inpatient Hospital Stay: Payer: Medicare Other

## 2019-06-07 ENCOUNTER — Inpatient Hospital Stay: Payer: Medicare Other | Admitting: *Deleted

## 2019-06-07 ENCOUNTER — Inpatient Hospital Stay (HOSPITAL_BASED_OUTPATIENT_CLINIC_OR_DEPARTMENT_OTHER): Payer: Medicare Other | Admitting: Oncology

## 2019-06-07 ENCOUNTER — Inpatient Hospital Stay (HOSPITAL_BASED_OUTPATIENT_CLINIC_OR_DEPARTMENT_OTHER): Payer: Medicare Other | Admitting: Hospice and Palliative Medicine

## 2019-06-07 ENCOUNTER — Encounter: Payer: Self-pay | Admitting: Oncology

## 2019-06-07 ENCOUNTER — Other Ambulatory Visit: Payer: Self-pay

## 2019-06-07 VITALS — BP 121/70 | HR 67 | Temp 97.4°F | Resp 18 | Wt 191.8 lb

## 2019-06-07 DIAGNOSIS — Z515 Encounter for palliative care: Secondary | ICD-10-CM

## 2019-06-07 DIAGNOSIS — C787 Secondary malignant neoplasm of liver and intrahepatic bile duct: Secondary | ICD-10-CM | POA: Diagnosis not present

## 2019-06-07 DIAGNOSIS — Z5111 Encounter for antineoplastic chemotherapy: Secondary | ICD-10-CM | POA: Diagnosis not present

## 2019-06-07 DIAGNOSIS — Z95828 Presence of other vascular implants and grafts: Secondary | ICD-10-CM

## 2019-06-07 DIAGNOSIS — C259 Malignant neoplasm of pancreas, unspecified: Secondary | ICD-10-CM

## 2019-06-07 DIAGNOSIS — D649 Anemia, unspecified: Secondary | ICD-10-CM

## 2019-06-07 DIAGNOSIS — Z7189 Other specified counseling: Secondary | ICD-10-CM | POA: Diagnosis not present

## 2019-06-07 LAB — CBC WITH DIFFERENTIAL/PLATELET
Abs Immature Granulocytes: 0.01 10*3/uL (ref 0.00–0.07)
Basophils Absolute: 0 10*3/uL (ref 0.0–0.1)
Basophils Relative: 1 %
Eosinophils Absolute: 0.2 10*3/uL (ref 0.0–0.5)
Eosinophils Relative: 4 %
HCT: 32.5 % — ABNORMAL LOW (ref 39.0–52.0)
Hemoglobin: 10.1 g/dL — ABNORMAL LOW (ref 13.0–17.0)
Immature Granulocytes: 0 %
Lymphocytes Relative: 18 %
Lymphs Abs: 1.1 10*3/uL (ref 0.7–4.0)
MCH: 26.6 pg (ref 26.0–34.0)
MCHC: 31.1 g/dL (ref 30.0–36.0)
MCV: 85.5 fL (ref 80.0–100.0)
Monocytes Absolute: 0.5 10*3/uL (ref 0.1–1.0)
Monocytes Relative: 9 %
Neutro Abs: 4.2 10*3/uL (ref 1.7–7.7)
Neutrophils Relative %: 68 %
Platelets: 283 10*3/uL (ref 150–400)
RBC: 3.8 MIL/uL — ABNORMAL LOW (ref 4.22–5.81)
RDW: 15.3 % (ref 11.5–15.5)
WBC: 6.1 10*3/uL (ref 4.0–10.5)
nRBC: 0 % (ref 0.0–0.2)

## 2019-06-07 LAB — COMPREHENSIVE METABOLIC PANEL
ALT: 27 U/L (ref 0–44)
AST: 26 U/L (ref 15–41)
Albumin: 4.1 g/dL (ref 3.5–5.0)
Alkaline Phosphatase: 76 U/L (ref 38–126)
Anion gap: 9 (ref 5–15)
BUN: 20 mg/dL (ref 8–23)
CO2: 22 mmol/L (ref 22–32)
Calcium: 9.4 mg/dL (ref 8.9–10.3)
Chloride: 106 mmol/L (ref 98–111)
Creatinine, Ser: 1.15 mg/dL (ref 0.61–1.24)
GFR calc Af Amer: >60 mL/min (ref >60)
GFR calc non Af Amer: >60 mL/min (ref >60)
Glucose, Bld: 137 mg/dL — ABNORMAL HIGH (ref 70–99)
Potassium: 3.7 mmol/L (ref 3.5–5.1)
Sodium: 137 mmol/L (ref 135–145)
Total Bilirubin: 0.5 mg/dL (ref 0.3–1.2)
Total Protein: 7.7 g/dL (ref 6.5–8.1)

## 2019-06-07 MED ORDER — PROCHLORPERAZINE MALEATE 10 MG PO TABS
10.0000 mg | ORAL_TABLET | Freq: Once | ORAL | Status: AC
  Administered 2019-06-07: 10 mg via ORAL
  Filled 2019-06-07: qty 1

## 2019-06-07 MED ORDER — PACLITAXEL PROTEIN-BOUND CHEMO INJECTION 100 MG
125.0000 mg/m2 | Freq: Once | INTRAVENOUS | Status: AC
  Administered 2019-06-07: 250 mg via INTRAVENOUS
  Filled 2019-06-07: qty 50

## 2019-06-07 MED ORDER — SODIUM CHLORIDE 0.9 % IV SOLN
2000.0000 mg | Freq: Once | INTRAVENOUS | Status: AC
  Administered 2019-06-07: 2000 mg via INTRAVENOUS
  Filled 2019-06-07: qty 52.6

## 2019-06-07 MED ORDER — SODIUM CHLORIDE 0.9 % IV SOLN
Freq: Once | INTRAVENOUS | Status: AC
  Administered 2019-06-07: 10:00:00 via INTRAVENOUS
  Filled 2019-06-07: qty 250

## 2019-06-07 MED ORDER — HEPARIN SOD (PORK) LOCK FLUSH 100 UNIT/ML IV SOLN
500.0000 [IU] | Freq: Once | INTRAVENOUS | Status: AC | PRN
  Administered 2019-06-07: 500 [IU]
  Filled 2019-06-07: qty 5

## 2019-06-07 MED ORDER — SODIUM CHLORIDE 0.9% FLUSH
10.0000 mL | Freq: Once | INTRAVENOUS | Status: AC
  Administered 2019-06-07: 10 mL via INTRAVENOUS
  Filled 2019-06-07: qty 10

## 2019-06-07 NOTE — Progress Notes (Signed)
Patient here today for follow up and treatment consideration regarding pancreatic cancer. Patient denies concerns.

## 2019-06-07 NOTE — Progress Notes (Signed)
[place]  [phone] [phone]   Name: Craig [name] Date: 06/07/2019 MRN: [id number]  DOB: 1945/12/03  Patient Care Team: Paulene Floor as PCP - General (Physician Assistant) Clent Jacks, RN as Oncology Nurse Navigator    REASON FOR CONSULTATION: Palliative Care consult requested for this 73 y.o. 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  Patient is being initiated on chemotherapy with gemcitabine and Abraxane.  He was referred to palliative care to help address goals and manage ongoing symptoms.   SOCIAL HISTORY:     reports that he quit smoking about 20 months ago. His smoking use included cigarettes. He has a 37.50 pack-year smoking history. He has quit using smokeless tobacco.  His smokeless tobacco use included chew. He reports previous alcohol use. He reports previous drug use. Drug: Marijuana.   Patient is married and lives at home with his wife.  He has a son and a daughter but reports that he is estranged from both.  He had another daughter who is now deceased.  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  Note that patient is illiterate.  He also says that his wife often will not answer the phone and recommends that we call and leave a message on the answering machine and then call her right back.  ADVANCE DIRECTIVES:  Does not have  CODE STATUS:   PAST MEDICAL HISTORY: Past Medical History:  Diagnosis Date   Allergic rhinitis    Cancer ([place])    new dx Pancreatic Cancer - Aug 2020   CHF (congestive heart failure) (HCC)    Chronic cholecystitis    Coronary artery disease    DDD (degenerative disc disease), cervical    Dyspnea    Early cataract    Erectile dysfunction    GERD  (gastroesophageal reflux disease)    Gouty arthritis    Headache    occasional migraines   Hypercalcemia    Hyperlipemia    Hypertension    Palpitations    Peripheral vascular disease (HCC)    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   LIMA to LAD SVG to [employer] SVG SEQUENTIALLY to OM1 and OM2 SVG to ACUTE MARGINAL   Spinal stenosis of cervical region    Vitamin D deficiency     PAST SURGICAL HISTORY:  Past Surgical History:  Procedure Laterality Date   BACK SURGERY  2018    fusion with screws   CHOLECYSTECTOMY N/A 11/13/2018   Procedure: LAPAROSCOPIC CHOLECYSTECTOMY;  Surgeon: Vickie Epley, MD;  Location: [facility];  Service: General;  Laterality: N/A;   COLONOSCOPY     CORONARY ARTERY BYPASS GRAFT N/A 09/30/2017   Procedure: CORONARY ARTERY BYPASS GRAFTING times five using right and left Saphaneous vein harvested endoscopicly  and left internal mammary artery. (CABG),TEE;  Surgeon: Rexene Alberts, MD;  Location: [facility];  Service: Open Heart Surgery;  Laterality: N/A;   LEFT HEART CATH AND CORONARY ANGIOGRAPHY Left 09/06/2017   Procedure: LEFT HEART CATH AND CORONARY ANGIOGRAPHY;  Surgeon: Corey Skains, MD;  Location: [facility];  Service: Cardiovascular;  Laterality: Left;   percutaneous transluminal balloon angioplasty  01/2010   of left lower extremity   PORTA CATH INSERTION N/A 06/06/2019   Procedure: PORTA CATH INSERTION;  Surgeon: Katha Cabal, MD;  Location: [facility];  Service: Cardiovascular;  Laterality: N/A;   TEE WITHOUT CARDIOVERSION N/A 09/30/2017   Procedure: TRANSESOPHAGEAL ECHOCARDIOGRAM (TEE);  Surgeon: Rexene Alberts, MD;  Location: [facility];  Service: Open Heart Surgery;  Laterality: N/A;   VASCULAR SURGERY Left 2010   left exernal iliac and superficial femoral artery PTCA and stenting    HEMATOLOGY/ONCOLOGY HISTORY:  Oncology History  Pancreatic cancer metastasized to liver ([place])  06/01/2019 Initial Diagnosis    Pancreatic cancer metastasized to liver ([place])   06/07/2019 -  Chemotherapy   The patient had PACLitaxel-protein bound (ABRAXANE) chemo infusion 250 mg, 125 mg/m2 = 250 mg (100 % of original dose 125 mg/m2), Intravenous,  Once, 1 of 6 cycles Dose modification: 100 mg/m2 (original dose 125 mg/m2, Cycle 1, Reason: Provider Judgment), 125 mg/m2 (original dose 125 mg/m2, Cycle 1, Reason: Provider Judgment) Administration: 250 mg (06/07/2019) gemcitabine (GEMZAR) 2,000 mg in sodium chloride 0.9 % 250 mL chemo infusion, 2,090 mg, Intravenous,  Once, 1 of 6 cycles Administration: 2,000 mg (06/07/2019)  for chemotherapy treatment.      ALLERGIES:  is allergic to lipitor [atorvastatin]; zetia [ezetimibe]; lisinopril; protonix [pantoprazole]; and spironolactone.  MEDICATIONS:  Current Outpatient Medications  Medication Sig Dispense Refill   acetaminophen (TYLENOL) 500 MG tablet Take 2 tablets (1,000 mg total) by mouth every 6 (six) hours. (Patient taking differently: Take 1,000 mg by mouth every 6 (six) hours as needed for moderate pain. ) 30 tablet 0   aspirin EC 81 MG tablet Take 81 mg by mouth daily.     carvedilol (COREG) 6.25 MG tablet Take 6.25 mg by mouth 2 (two) times daily with a meal.  3   diltiazem (CARDIZEM CD) 300 MG 24 hr capsule Take 1 capsule (300 mg total) by mouth daily. 90 capsule 1   dorzolamide-timolol (COSOPT) 22.3-6.8 MG/ML ophthalmic solution INSTILL ONE DROP INTO BOTH EYES TWICE DAILY     ferrous sulfate 325 (65 FE) MG tablet Take 325 mg by mouth daily with breakfast.     gabapentin (NEURONTIN) 300 MG capsule Take 2 capsules (600 mg total) by mouth 3 (three) times daily. 540 capsule 0   hydrochlorothiazide (HYDRODIURIL) 25 MG tablet Take 1 tablet (25 mg total) by mouth daily. 90 tablet 3   latanoprost (XALATAN) 0.005 % ophthalmic solution Place 1 drop into both eyes at bedtime.   3   lidocaine-prilocaine (EMLA) cream Apply to affected area once 30 g 3   Multiple  Vitamin (MULTIVITAMIN WITH MINERALS) TABS tablet Take 1 tablet by mouth daily.     OMEGA-3 FATTY ACIDS PO Take 1,200 mg by mouth daily.      omeprazole (PRILOSEC) 40 MG capsule TAKE 1 CAPSULE BY MOUTH EVERY DAY 90 capsule 0   ondansetron (ZOFRAN) 8 MG tablet Take 1 tablet (8 mg total) by mouth 2 (two) times daily as needed (Nausea or vomiting). (Patient not taking: Reported on 06/01/2019) 30 tablet 1   prochlorperazine (COMPAZINE) 10 MG tablet Take 1 tablet (10 mg total) by mouth every 6 (six) hours as needed (Nausea or vomiting). (Patient not taking: Reported on 06/01/2019) 30 tablet 1   rosuvastatin (CRESTOR) 10 MG tablet Take 1 tablet (10 mg total) by mouth daily. 90 tablet 0   sucralfate (CARAFATE) 1 g tablet Take 1 tablet (1 g total) by mouth 4 (four) times daily -  with meals and at bedtime. (Patient not taking: Reported on 06/06/2019) 28 tablet 0   triamcinolone  cream (KENALOG) 0.1 % APPLY 1 APPLICATION TOPICALLY 2 (TWO) TIMES DAILY. TO EXTERNAL EAR 30 g 0   valsartan (DIOVAN) 160 MG tablet Take 1 tablet (160 mg total) by mouth daily. 180 tablet 3   No current facility-administered medications for this visit.     VITAL SIGNS: There were no vitals taken for this visit. There were no vitals filed for this visit.  Estimated body mass index is 28.32 kg/m as calculated from the following:   Height as of 06/06/19: 5\' 9"  (1.753 m).   Weight as of an earlier encounter on 06/07/19: 191 lb 12.8 oz (87 kg).  LABS: CBC:    Component Value Date/Time   WBC 6.1 06/07/2019 0848   HGB 10.1 (L) 06/07/2019 0848   HGB 11.2 (L) 05/15/2019 1111   HCT 32.5 (L) 06/07/2019 0848   HCT 32.7 (L) 05/15/2019 1111   PLT 283 06/07/2019 0848   PLT 277 05/15/2019 1111   MCV 85.5 06/07/2019 0848   MCV 80 05/15/2019 1111   NEUTROABS 4.2 06/07/2019 0848   NEUTROABS 3.5 05/15/2019 1111   LYMPHSABS 1.1 06/07/2019 0848   LYMPHSABS 1.2 05/15/2019 1111   MONOABS 0.5 06/07/2019 0848   EOSABS 0.2 06/07/2019  0848   EOSABS 0.2 05/15/2019 1111   BASOSABS 0.0 06/07/2019 0848   BASOSABS 0.1 05/15/2019 1111   Comprehensive Metabolic Panel:    Component Value Date/Time   NA 137 06/07/2019 0848   NA 139 05/15/2019 1111   K 3.7 06/07/2019 0848   CL 106 06/07/2019 0848   CO2 22 06/07/2019 0848   BUN 20 06/07/2019 0848   BUN 23 05/15/2019 1111   CREATININE 1.15 06/07/2019 0848   CREATININE 0.95 06/17/2017 1035   GLUCOSE 137 (H) 06/07/2019 0848   CALCIUM 9.4 06/07/2019 0848   AST 26 06/07/2019 0848   ALT 27 06/07/2019 0848   ALKPHOS 76 06/07/2019 0848   BILITOT 0.5 06/07/2019 0848   BILITOT <0.2 05/15/2019 1111   PROT 7.7 06/07/2019 0848   PROT 6.8 05/15/2019 1111   ALBUMIN 4.1 06/07/2019 0848   ALBUMIN 4.3 05/15/2019 1111    RADIOGRAPHIC STUDIES: US Abdomen Complete  Result Date: 05/15/2019 CLINICAL DATA:  73 year old male with right upper quadrant abdominal pain. Cholecystectomy in 2019. EXAM: ABDOMEN ULTRASOUND COMPLETE COMPARISON:  Chest CT 05/10/2019. Ultrasound 07/24/2018. Lumbar MRI 01/15/2017. FINDINGS: Gallbladder: Surgically absent. Common bile duct: Diameter: 2 millimeters, normal. Liver: Around hypoechoic lesion in the right lobe on image 15 measures up to 20 millimeters diameter. 3 smaller round hypoechoic lesions are suggested in the left lobe (image 25) and central right lobe (image 26). These were not evident in 2019. Background liver echogenicity is within normal limits. Portal vein is patent on color Doppler imaging with normal direction of blood flow towards the liver. IVC: No abnormality visualized. Pancreas: Incompletely visualized due to overlying bowel gas, visualized portions within normal limits. Spleen: Size and appearance within normal limits. Right Kidney: Length: 11.8 centimeters. Chronic left renal lower pole cyst appears not significantly changed since 2018 and benign with no vascular elements (image 47). No right hydronephrosis. Left Kidney: Length: 12.8 centimeters.  Stable small benign appearing midpole cyst since 2018, 19 millimeters. No left hydronephrosis. Abdominal aorta: No aneurysm visualized. Other findings: No free fluid. IMPRESSION: 1. Multiple new round hypoechoic liver lesions which were not evident on the 2019 ultrasound range from 8 mm to 20 mm diameter. These are nonspecific but suspicious for hepatic metastatic disease. Given that Chest CT was just recently performed  on 05/10/2019, recommend follow-up CT Abdomen and Pelvis with oral and IV contrast. 2. Otherwise negative abdomen ultrasound status post cholecystectomy. These results will be called to the ordering clinician or representative by the Radiologist Assistant, and communication documented in the PACS or zVision Dashboard. Electronically Signed   By: Genevie Ann M.D.   On: 05/15/2019 14:02   Ct Abdomen Pelvis W Contrast  Result Date: 05/16/2019 CLINICAL DATA:  Heavy alcohol abuse. Abdominal pain for 10 days. Indeterminate hypoechoic liver lesions on recent sonogram. EXAM: CT ABDOMEN AND PELVIS WITH CONTRAST TECHNIQUE: Multidetector CT imaging of the abdomen and pelvis was performed using the standard protocol following bolus administration of intravenous contrast. CONTRAST:  180mL OMNIPAQUE IOHEXOL 300 MG/ML  SOLN COMPARISON:  05/15/2019 abdominal sonogram. FINDINGS: Lower chest: There is a 4.4 x 2.6 cm pleural based mass at the extreme medial basilar left lung base (series 2/image 12). A few scattered tiny solid pulmonary nodules at the right lung base, largest 4 mm in the right lower lobe (series 3/image 11). Coronary atherosclerosis. Visualized lower sternotomy wires are intact. Hepatobiliary: There are several (at least 8) ill-defined hypodense liver masses scattered throughout the liver, largest 3.1 x 2.7 cm in segment 6 right liver lobe (series 2/image 25) and 1.5 x 1.1 cm in the segment 7 right liver lobe (series 2/image 20). Cholecystectomy. No biliary ductal dilatation. Pancreas: There is a poorly  marginated heterogeneous hypodense pancreatic mass at the uncinate process measuring approximately 2.9 x 2.7 cm (series 2/image 31). This mass abuts approximately 30-40% of the circumference of the proximal SMA posteriorly (series 2/image 29). The mass encases a tributary of the SMV (series 2/image 33). No involvement by the mass of the portosplenic venous confluence or main portal vein. No pancreatic duct dilation. Spleen: Normal size. No mass. Adrenals/Urinary Tract: Normal adrenals. Simple bilateral renal cysts, largest 4.5 cm in the lower right kidney. Scattered subcentimeter hypodense renal cortical lesions in both kidneys are too small to characterize. No hydronephrosis. Normal bladder. Stomach/Bowel: Normal non-distended stomach. Normal caliber small bowel with no small bowel wall thickening. Normal appendix. Normal large bowel with no diverticulosis, large bowel wall thickening or pericolonic fat stranding. Vascular/Lymphatic: Atherosclerotic nonaneurysmal abdominal aorta. Patent portal, splenic, hepatic and renal veins. A few mildly enlarged portacaval nodes, largest 1.4 cm (series 2/image 23). Mildly enlarged 1.0 cm aortocaval node (series 2/image 35). No pelvic adenopathy. Reproductive: Mildly enlarged prostate. Other: No pneumoperitoneum, ascites or focal fluid collection. Musculoskeletal: No aggressive appearing focal osseous lesions. Status post posterior spinal fixation bilaterally at L4-5. Moderate thoracolumbar spondylosis. IMPRESSION: 1. Poorly marginated heterogeneous hypodense 2.9 cm pancreatic mass at the uncinate process, worrisome for primary pancreatic adenocarcinoma. No biliary or pancreatic duct dilation. MRI abdomen without and with IV contrast is indicated for further evaluation. 2. Several ill-defined hypodense liver masses scattered throughout the liver, largest 3.1 cm in the segment 6 right liver lobe, worrisome for liver metastases. 3. Nonspecific portacaval and aortocaval adenopathy,  worrisome for metastatic nodes. 4. 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 Additional tiny pulmonary nodules scattered at the right lung base are indeterminate and should be reassessed on follow-up chest CT in 3 months. 5. Mild prostatomegaly. 6.  Aortic Atherosclerosis (ICD10-I70.0). These results will be called to the ordering clinician or representative by the Radiologist Assistant, and communication documented in the PACS or zVision Dashboard. Electronically Signed   By: Ilona Sorrel M.D.   On: 05/16/2019 15:13   Nm Pet Image Initial (  pi) Skull Base To Thigh  Result Date: 06/04/2019 CLINICAL DATA:  Initial treatment strategy for stage IV pancreatic cancer with biopsy confirmed liver metastasis. EXAM: NUCLEAR MEDICINE PET SKULL BASE TO THIGH TECHNIQUE: 11.0 mCi F-18 FDG was injected intravenously. Full-ring PET imaging was performed from the skull base to thigh after the radiotracer. CT data was obtained and used for attenuation correction and anatomic localization. Fasting blood glucose: 123 mg/dl COMPARISON:  05/16/2019 CT abdomen/pelvis. 05/10/2019 screening chest CT. FINDINGS: Mediastinal blood pool activity: SUV max 3.2 Liver activity: SUV max NA NECK: No hypermetabolic lymph nodes in the neck. Incidental CT findings: none CHEST: Bilateral hypermetabolic hilar foci with max SUV 5.2 on the left and 3.5 on the right. No enlarged or hypermetabolic axillary or mediastinal lymph nodes. No hypermetabolic pulmonary findings. A few scattered solid right pulmonary nodules, largest 4 mm in the basilar right lower lobe (series 3/image 125), below PET resolution. The pleural based solid density 4.3 x 2.3 cm structure at the extreme medial left lung base (series 3/image 129) is non hypermetabolic. Incidental CT findings: Three-vessel coronary atherosclerosis status post CABG. Atherosclerotic nonaneurysmal thoracic aorta. Dilated main pulmonary artery (3.6 cm diameter).  Intact sternotomy wires. ABDOMEN/PELVIS: Intensely hypermetabolic uncinate process pancreatic mass measuring approximately 4.2 x 2.6 cm with max SUV 14.4 (series 3/image 163). Enlarged hypermetabolic 1.4 cm portacaval node with max SUV 10.5 (series 3/image 155). Several small hypermetabolic aortocaval and left para-aortic lymph nodes, largest 1.0 cm in the aortocaval chain with max SUV 6.2 (series 3/image 171). Several (at least 5) hypermetabolic liver masses scattered throughout the liver, largest 3.5 cm in the segment 6 right liver lobe with max SUV 12.6 (series 3/image 152), 2.4 cm in the central segment 5 right liver lobe with max SUV 13.6 (series 3/image 147) and 2.3 cm in posterior right liver lobe adjacent to the IVC with max SUV 10.9 (series 3/image 147). No abnormal hypermetabolic activity within the adrenal glands or spleen. No hypermetabolic lymph nodes in the pelvis. Incidental CT findings: Cholecystectomy. Simple 4.6 cm lower right renal cyst. Simple 1.5 cm interpolar left renal cyst. Atherosclerotic nonaneurysmal abdominal aorta. SKELETON: Faint hypermetabolism in the medial right iliac bone with associated faint sclerosis with max SUV 3.3 (series 3/image 215). No additional hypermetabolic osseous foci. Incidental CT findings: none IMPRESSION: 1. Intensely hypermetabolic uncinate process pancreatic mass, compatible with primary pancreatic adenocarcinoma. 2. Hypermetabolic portacaval, aortocaval and left para-aortic nodal metastases. Bilateral hilar nodal hypermetabolism is also compatible with nodal metastatic disease. 3. Several (at least 5) hypermetabolic liver metastases scattered throughout the liver. 4. Equivocal faint hypermetabolism in the medial right iliac bone with associated faint sclerosis, osseous metastasis not excluded, although potentially degenerative. 5. Tiny scattered right pulmonary nodules, below PET resolution, recommend attention on follow-up chest CT in 3-6 months. 6. Chronic  findings include: Aortic Atherosclerosis (ICD10-I70.0). Dilated main pulmonary artery, suggesting pulmonary arterial hypertension. Electronically Signed   By: Ilona Sorrel M.D.   On: 06/04/2019 13:56   US Biopsy (liver)  Result Date: 05/21/2019 INDICATION: Multiple liver lesions. EXAM: ULTRASOUND DIRECTED LIVER BIOPSY. MEDICATIONS: None. ANESTHESIA/SEDATION: Moderate (conscious) sedation was employed during this procedure. A total of Versed 4.5 mg and Fentanyl 100 mcg was administered intravenously. Moderate Sedation Time: 37 minutes. The patient's level of consciousness and vital signs were monitored continuously by radiology nursing throughout the procedure under my direct supervision. FLUOROSCOPY TIME:  Fluoroscopy Time: 0 COMPLICATIONS: None immediate. PROCEDURE: After discussing the risks and benefits of this procedure with the patient informed consent was  obtained. Abdomen sterilely prepped and draped. Local anesthesia administered 1% lidocaine. IV conscious sedation performed as above. Ultrasound liver biopsy was performed with a 18 gauge needle. Two good samples obtained. No complications. Post procedure patient stable. Discharge instructions given. Follow-up with patient's physician. IMPRESSION: Successful ultrasound directed liver mass biopsy. Electronically Signed   By: Marcello Moores  Register   On: 05/21/2019 09:55   Ct Biopsy  Result Date: 05/24/2019 INDICATION: Probable liver metastasis. EXAM: CT DIRECTED LIVER MASS BIOPSY MEDICATIONS: None. ANESTHESIA/SEDATION: Moderate (conscious) sedation was employed during this procedure. A total of Versed 3.0 mg and Fentanyl 50.0 mcg was administered intravenously. Moderate Sedation Time: 15 minutes. The patient's level of consciousness and vital signs were monitored continuously by radiology nursing throughout the procedure under my direct supervision. FLUOROSCOPY TIME:  Fluoroscopy Time: 9 COMPLICATIONS: None immediate. PROCEDURE: After discussing the risks  and benefits of this procedure with the patient informed consent was obtained local anesthesia administered 1% lidocaine. Under CT guidance CT-directed liver mass biopsy was performed without difficulty. Two 13 mm 18 gauge core samples obtained and sent to pathology. Post biopsy CT revealed no complications. Patient was transferred to specials recovery. Patient was evaluated in specials recovery and noted to be stable. Discharge instructions were given to him and his wife. Follow-up is with oncology. IMPRESSION: Successful CT directed liver mass biopsy. Electronically Signed   By: Marcello Moores  Register   On: 05/24/2019 12:22   Ct Chest Lung Cancer Screening Low Dose Wo Contrast  Result Date: 05/10/2019 CLINICAL DATA:  73 year old male with 38 pack-year history of smoking. Lung cancer screening. EXAM: CT CHEST WITHOUT CONTRAST LOW-DOSE FOR LUNG CANCER SCREENING TECHNIQUE: Multidetector CT imaging of the chest was performed following the standard protocol without IV contrast. COMPARISON:  None. FINDINGS: Cardiovascular: The heart size is normal. No substantial pericardial effusion. Status post CABG. Atherosclerotic calcification is noted in the wall of the thoracic aorta. Mediastinum/Nodes: No mediastinal lymphadenopathy. No evidence for gross hilar lymphadenopathy although assessment is limited by the lack of intravenous contrast on today's study. The esophagus has normal imaging features. There is no axillary lymphadenopathy. Lungs/Pleura: Numerous tiny pulmonary nodules are seen scattered in both lungs, measuring up to a maximum volume derived equivalent diameter of 4.4 mm. No overtly suspicious nodule or mass. No pleural effusion. Centrilobular emphsyema noted. Upper Abdomen: Unremarkable. Musculoskeletal: No worrisome lytic or sclerotic osseous abnormality. Centrilobular emphsyema noted. IMPRESSION: 1. Lung-RADS 2, benign appearance or behavior. Continue annual screening with low-dose chest CT without contrast in  12 months. 2.  Aortic Atherosclerois (ICD10-170.0) 3.  Emphysema. [id number]) Electronically Signed   By: Misty Stanley M.D.   On: 05/10/2019 17:31    PERFORMANCE STATUS (ECOG) : 0 - Asymptomatic  Review of Systems Unless otherwise noted, a complete review of systems is negative.  Physical Exam General: NAD, frail appearing, thin Pulmonary: unlabored Extremities: no edema, no joint deformities Skin: no rashes Neurological: Weakness but otherwise nonfocal  IMPRESSION: Routine follow-up visit made today.  Patient was seen in the infusion area while he was receiving treatment.  He reports that he is doing reasonably well without any acute changes or concerns.  He denies any distressing symptoms today.  He says that his oral intake is good.  Weight appears stable at 191 pounds.  I tried calling patient's wife but did not reach her. She had planned to talk with patient more about goals/code status.   PLAN: -Continue current scope of treatment -MOST Form previously discussed -RTC in 1 month   Patient  expressed understanding and was in agreement with this plan. He also understands that He can call the clinic at any time with any questions, concerns, or complaints.     Time Total: 15 minutes  Visit consisted of counseling and education dealing with the complex and emotionally intense issues of symptom management and palliative care in the setting of serious and potentially life-threatening illness.Greater than 50%  of this time was spent counseling and coordinating care related to the above assessment and plan.  Signed by: Altha Harm, PhD, NP-C [phone] (Work Cell)

## 2019-06-07 NOTE — Progress Notes (Signed)
Hematology/Oncology  Follow up note [facility] [phone] [phone]   Patient Care Team: Paulene Floor as PCP - General (Physician Assistant) Clent Jacks, RN as Oncology Nurse Navigator  REFERRING PROVIDER: Trinna Post, PA-C  CHIEF COMPLAINTS/REASON FOR VISIT:  Evaluation of abnormal CT  HISTORY OF PRESENTING ILLNESS:   Craig [name] is a  73 y.o.  male with PMH listed below, including CABG x5, PVD, hypertension, former tobacco abuse, former alcohol abuse, iron deficiency anemia, was seen in consultation at the request of  Terrilee Croak, Adriana M, PA-C  for evaluation of abnormal CT Patient recently presented to primary care provider complaining for abdominal pain radiating to his back for about 10 days.  Patient uses Tylenol as needed for pain. Work-up showed elevated amylase, lipase, mild anemia with hemoglobin of 11.3 Acute hepatitis panel negative. 05/16/2019 CT abdomen pelvis with contrast showed poorly marginated heterogeneous hypodense 2.9 cm pancreatic mass at the uncinate process, worrisome for primary pancreatic cancer.  No biliary or pancreatic duct dilation. Several ill defined hypodense liver masses scattered throughout the liver, largest 3.1 cm in the segment 6 right liver lobe. Nonspecific portacaval and aortocaval adenopathy. Extreme medial left lung base 4.4 cm pleural-based mass probably a pleural metastasis.  Additional tiny pulmonary nodules scattered at the right lung base are indeterminate. Mild prostate or megaly  Patient was referred to heme-onc for further evaluation. Patient reports that his abdominal pain was 10 out of 10 a few days ago, he uses Tylenol as needed. Today his pain is getting better, 2 out of 10.  Denies any nausea, vomiting, diarrhea, fever or chills. Married, lives with wife.  Appetite is good.  He weighs 195 pounds today at the clinic. His weight was 204 pounds back in May  2020.  He had a history of 37.5-pack-year smoking history quitting 2018. No longer drinking alcohol.  # ultrasound-guided liver biopsy on 05/21/2019.  Pathology showed fragments of benign hepatic parenchyma showing minimal macro vascular steatosis, otherwise no significant histo pathologic change. Single core of renal tissue showing approximately 25% glomerulosclerosis  # #History of CAD, status post CABG x5.  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  INTERVAL HISTORY Craig [name] is a 73 y.o. male who has above history reviewed by me today presents for follow up for evaluation prior to first cycle of chemotherapy. Problems and complaints are listed below: Patient has had Mediport placed, also has been to chemotherapy class. He reports having no pain today in the clinic. Reports understanding instructions of antiemetics   Review of Systems  Constitutional: Positive for fatigue and unexpected weight change. Negative for appetite change, chills and fever.  HENT:   Negative for hearing loss and voice change.   Eyes: Negative for eye problems and icterus.  Respiratory: Negative for chest tightness, cough and shortness of breath.   Cardiovascular: Negative for chest pain and leg swelling.  Gastrointestinal: Negative for abdominal distention and abdominal pain.  Endocrine: Negative for hot flashes.  Genitourinary: Negative for difficulty urinating, dysuria and frequency.   Musculoskeletal: Negative for arthralgias.  Skin: Negative for itching and rash.  Neurological: Negative for light-headedness and numbness.  Hematological: Negative for adenopathy. Does not bruise/bleed easily.  Psychiatric/Behavioral: Negative for confusion.    MEDICAL HISTORY:  Past Medical History:  Diagnosis Date   Allergic rhinitis    Cancer ([place])    new dx  Pancreatic Cancer - Aug  2020   CHF (congestive heart failure) (HCC)    Chronic cholecystitis    Coronary artery disease    DDD (degenerative disc disease), cervical    Dyspnea    Early cataract    Erectile dysfunction    GERD (gastroesophageal reflux disease)    Gouty arthritis    Headache    occasional migraines   Hypercalcemia    Hyperlipemia    Hypertension    Palpitations    Peripheral vascular disease (HCC)    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   LIMA to LAD SVG to DISTAL CIRCUMFLEX SVG SEQUENTIALLY to OM1 and OM2 SVG to ACUTE MARGINAL   Spinal stenosis of cervical region    Vitamin D deficiency     SURGICAL HISTORY: Past Surgical History:  Procedure Laterality Date   BACK SURGERY  2018    fusion with screws   CHOLECYSTECTOMY N/A 11/13/2018   Procedure: LAPAROSCOPIC CHOLECYSTECTOMY;  Surgeon: Vickie Epley, MD;  Location: [facility];  Service: General;  Laterality: N/A;   COLONOSCOPY     CORONARY ARTERY BYPASS GRAFT N/A 09/30/2017   Procedure: CORONARY ARTERY BYPASS GRAFTING times five using right and left Saphaneous vein harvested endoscopicly  and left internal mammary artery. (CABG),TEE;  Surgeon: Rexene Alberts, MD;  Location: [place];  Service: Open Heart Surgery;  Laterality: N/A;   LEFT HEART CATH AND CORONARY ANGIOGRAPHY Left 09/06/2017   Procedure: LEFT HEART CATH AND CORONARY ANGIOGRAPHY;  Surgeon: Corey Skains, MD;  Location: [place] [facility];  Service: Cardiovascular;  Laterality: Left;   percutaneous transluminal balloon angioplasty  01/2010   of left lower extremity   PORTA CATH INSERTION N/A 06/06/2019   Procedure: PORTA CATH INSERTION;  Surgeon: Katha Cabal, MD;  Location: [place] [facility];  Service: Cardiovascular;  Laterality: N/A;   TEE WITHOUT CARDIOVERSION N/A 09/30/2017   Procedure: TRANSESOPHAGEAL ECHOCARDIOGRAM (TEE);  Surgeon: Rexene Alberts, MD;  Location: [place];  Service: Open Heart Surgery;  Laterality: N/A;   VASCULAR SURGERY  Left 2010   left exernal iliac and superficial femoral artery PTCA and stenting    SOCIAL HISTORY: Social History   Socioeconomic History   Marital status: Married    Spouse name: Enid Derry   Number of children: 2   Years of education: Not on file   Highest education level: Not on file  Occupational History   Occupation: drove tractors    Comment: retired  Scientist, product/process development strain: Not hard at [employer] insecurity    Worry: Never true    Inability: Never true   Transportation needs    Medical: No    Non-medical: No  Tobacco Use   Smoking status: Former Smoker    Packs/day: 0.75    Years: 50.00    Pack years: 37.50    Types: Cigarettes    Quit date: 09/27/2017    Years since quitting: 1.6   Smokeless tobacco: Former Systems developer    Types: Chew  Substance and Sexual Activity   Alcohol use: Not Currently    Comment: stopped drinking before cabg   Drug use: Not Currently    Types: Marijuana    Comment: stopped smoking before CABG   Sexual activity: Not Currently  Lifestyle   Physical activity    Days per week: 5 days    Minutes per session: 150+ min   Stress: Not at all  Relationships   Social connections  Talks on phone: More than three times a week    Gets together: More than three times a week    Attends religious service: Never    Active member of club or organization: No    Attends meetings of clubs or organizations: Never    Relationship status: Married   Intimate partner violence    Fear of current or ex partner: Not on file    Emotionally abused: Not on file    Physically abused: Not on file    Forced sexual activity: Not on file  Other Topics Concern   Not on file  Social History Narrative   Not on file    FAMILY HISTORY: Family History  Problem Relation Age of Onset   Brain cancer Mother    Emphysema Father    Cancer Brother     ALLERGIES:  is allergic to lipitor [atorvastatin]; zetia [ezetimibe];  lisinopril; protonix [pantoprazole]; and spironolactone.  MEDICATIONS:  Current Outpatient Medications  Medication Sig Dispense Refill   acetaminophen (TYLENOL) 500 MG tablet Take 2 tablets (1,000 mg total) by mouth every 6 (six) hours. (Patient taking differently: Take 1,000 mg by mouth every 6 (six) hours as needed for moderate pain. ) 30 tablet 0   aspirin EC 81 MG tablet Take 81 mg by mouth daily.     carvedilol (COREG) 6.25 MG tablet Take 6.25 mg by mouth 2 (two) times daily with a meal.  3   diltiazem (CARDIZEM CD) 300 MG 24 hr capsule Take 1 capsule (300 mg total) by mouth daily. 90 capsule 1   dorzolamide-timolol (COSOPT) 22.3-6.8 MG/ML ophthalmic solution INSTILL ONE DROP INTO BOTH EYES TWICE DAILY     ferrous sulfate 325 (65 FE) MG tablet Take 325 mg by mouth daily with breakfast.     gabapentin (NEURONTIN) 300 MG capsule Take 2 capsules (600 mg total) by mouth 3 (three) times daily. 540 capsule 0   hydrochlorothiazide (HYDRODIURIL) 25 MG tablet Take 1 tablet (25 mg total) by mouth daily. 90 tablet 3   latanoprost (XALATAN) 0.005 % ophthalmic solution Place 1 drop into both eyes at bedtime.   3   lidocaine-prilocaine (EMLA) cream Apply to affected area once 30 g 3   Multiple Vitamin (MULTIVITAMIN WITH MINERALS) TABS tablet Take 1 tablet by mouth daily.     OMEGA-3 FATTY ACIDS PO Take 1,200 mg by mouth daily.      omeprazole (PRILOSEC) 40 MG capsule TAKE 1 CAPSULE BY MOUTH EVERY DAY 90 capsule 0   rosuvastatin (CRESTOR) 10 MG tablet Take 1 tablet (10 mg total) by mouth daily. 90 tablet 0   triamcinolone cream (KENALOG) 0.1 % APPLY 1 APPLICATION TOPICALLY 2 (TWO) TIMES DAILY. TO EXTERNAL EAR 30 g 0   valsartan (DIOVAN) 160 MG tablet Take 1 tablet (160 mg total) by mouth daily. 180 tablet 3   ondansetron (ZOFRAN) 8 MG tablet Take 1 tablet (8 mg total) by mouth 2 (two) times daily as needed (Nausea or vomiting). (Patient not taking: Reported on 06/01/2019) 30 tablet 1    prochlorperazine (COMPAZINE) 10 MG tablet Take 1 tablet (10 mg total) by mouth every 6 (six) hours as needed (Nausea or vomiting). (Patient not taking: Reported on 06/01/2019) 30 tablet 1   sucralfate (CARAFATE) 1 g tablet Take 1 tablet (1 g total) by mouth 4 (four) times daily -  with meals and at bedtime. (Patient not taking: Reported on 06/06/2019) 28 tablet 0   No current facility-administered medications for this visit.  PHYSICAL EXAMINATION: ECOG PERFORMANCE STATUS: 2 - Symptomatic, <50% confined to bed Vitals:   06/07/19 0923  BP: 121/70  Pulse: 67  Resp: 18  Temp: (!) 97.4 F (36.3 C)   Filed Weights   06/07/19 0923  Weight: 191 lb 12.8 oz (87 kg)    Physical Exam Constitutional:      General: He is not in acute distress. HENT:     Head: Normocephalic and atraumatic.  Eyes:     General: No scleral icterus.    Pupils: Pupils are equal, round, and reactive to light.  Neck:     Musculoskeletal: Normal range of motion and neck supple.  Cardiovascular:     Rate and Rhythm: Normal rate and regular rhythm.     Heart sounds: Normal heart sounds.  Pulmonary:     Effort: Pulmonary effort is normal. No respiratory distress.     Breath sounds: No wheezing.  Abdominal:     General: Bowel sounds are normal. There is no distension.     Palpations: Abdomen is soft. There is no mass.     Tenderness: There is no abdominal tenderness.  Musculoskeletal: Normal range of motion.        General: No deformity.  Skin:    General: Skin is warm and dry.     Findings: No erythema or rash.  Neurological:     General: No focal deficit present.     Mental Status: He is alert and oriented to person, place, and time.     Cranial Nerves: No cranial nerve deficit.     Coordination: Coordination normal.  Psychiatric:        Behavior: Behavior normal.        Thought Content: Thought content normal.     LABORATORY DATA:  I have reviewed the data as listed Lab Results  Component  Value Date   WBC 6.1 06/07/2019   HGB 10.1 (L) 06/07/2019   HCT 32.5 (L) 06/07/2019   MCV 85.5 06/07/2019   PLT 283 06/07/2019   Recent Labs    05/15/19 1111 05/17/19 1212 06/07/19 0848  NA 139 140 137  K 4.7 4.4 3.7  CL 103 106 106  CO2 19* 24 22  GLUCOSE 111* 131* 137*  BUN 23 23 20   CREATININE 1.27 1.36* 1.15  CALCIUM 9.9 9.4 9.4  GFRNONAA 56* 52* >60  GFRAA 65 60* >60  PROT 6.8 8.2* 7.7  ALBUMIN 4.3 4.2 4.1  AST 21 31 26   ALT 18 30 27   ALKPHOS 71 67 76  BILITOT <0.2 0.5 0.5   Iron/TIBC/Ferritin/ %Sat    Component Value Date/Time   IRON 119 04/27/2019 0940   TIBC 363 04/27/2019 0940   FERRITIN 58 04/27/2019 0940   IRONPCTSAT 33 04/27/2019 0940      RADIOGRAPHIC STUDIES: I have personally reviewed the radiological images as listed and agreed with the findings in the report.  US Abdomen Complete  Result Date: 05/15/2019 CLINICAL DATA:  73 year old male with right upper quadrant abdominal pain. Cholecystectomy in 2019. EXAM: ABDOMEN ULTRASOUND COMPLETE COMPARISON:  Chest CT 05/10/2019. Ultrasound 07/24/2018. Lumbar MRI 01/15/2017. FINDINGS: Gallbladder: Surgically absent. Common bile duct: Diameter: 2 millimeters, normal. Liver: Around hypoechoic lesion in the right lobe on image 15 measures up to 20 millimeters diameter. 3 smaller round hypoechoic lesions are suggested in the left lobe (image 25) and central right lobe (image 26). These were not evident in 2019. Background liver echogenicity is within normal limits. Portal vein is patent on color Doppler  imaging with normal direction of blood flow towards the liver. IVC: No abnormality visualized. Pancreas: Incompletely visualized due to overlying bowel gas, visualized portions within normal limits. Spleen: Size and appearance within normal limits. Right Kidney: Length: 11.8 centimeters. Chronic left renal lower pole cyst appears not significantly changed since 2018 and benign with no vascular elements (image 47). No right  hydronephrosis. Left Kidney: Length: 12.8 centimeters. Stable small benign appearing midpole cyst since 2018, 19 millimeters. No left hydronephrosis. Abdominal aorta: No aneurysm visualized. Other findings: No free fluid. IMPRESSION: 1. Multiple new round hypoechoic liver lesions which were not evident on the 2019 ultrasound range from 8 mm to 20 mm diameter. These are nonspecific but suspicious for hepatic metastatic disease. Given that Chest CT was just recently performed on 05/10/2019, recommend follow-up CT Abdomen and Pelvis with oral and IV contrast. 2. Otherwise negative abdomen ultrasound status post cholecystectomy. These results will be called to the ordering clinician or representative by the Radiologist Assistant, and communication documented in the PACS or zVision Dashboard. Electronically Signed   By: Genevie Ann M.D.   On: 05/15/2019 14:02   Ct Abdomen Pelvis W Contrast  Result Date: 05/16/2019 CLINICAL DATA:  Heavy alcohol abuse. Abdominal pain for 10 days. Indeterminate hypoechoic liver lesions on recent sonogram. EXAM: CT ABDOMEN AND PELVIS WITH CONTRAST TECHNIQUE: Multidetector CT imaging of the abdomen and pelvis was performed using the standard protocol following bolus administration of intravenous contrast. CONTRAST:  115mL OMNIPAQUE IOHEXOL 300 MG/ML  SOLN COMPARISON:  05/15/2019 abdominal sonogram. FINDINGS: Lower chest: There is a 4.4 x 2.6 cm pleural based mass at the extreme medial basilar left lung base (series 2/image 12). A few scattered tiny solid pulmonary nodules at the right lung base, largest 4 mm in the right lower lobe (series 3/image 11). Coronary atherosclerosis. Visualized lower sternotomy wires are intact. Hepatobiliary: There are several (at least 8) ill-defined hypodense liver masses scattered throughout the liver, largest 3.1 x 2.7 cm in segment 6 right liver lobe (series 2/image 25) and 1.5 x 1.1 cm in the segment 7 right liver lobe (series 2/image 20). Cholecystectomy. No  biliary ductal dilatation. Pancreas: There is a poorly marginated heterogeneous hypodense pancreatic mass at the uncinate process measuring approximately 2.9 x 2.7 cm (series 2/image 31). This mass abuts approximately 30-40% of the circumference of the proximal SMA posteriorly (series 2/image 29). The mass encases a tributary of the SMV (series 2/image 33). No involvement by the mass of the portosplenic venous confluence or main portal vein. No pancreatic duct dilation. Spleen: Normal size. No mass. Adrenals/Urinary Tract: Normal adrenals. Simple bilateral renal cysts, largest 4.5 cm in the lower right kidney. Scattered subcentimeter hypodense renal cortical lesions in both kidneys are too small to characterize. No hydronephrosis. Normal bladder. Stomach/Bowel: Normal non-distended stomach. Normal caliber small bowel with no small bowel wall thickening. Normal appendix. Normal large bowel with no diverticulosis, large bowel wall thickening or pericolonic fat stranding. Vascular/Lymphatic: Atherosclerotic nonaneurysmal abdominal aorta. Patent portal, splenic, hepatic and renal veins. A few mildly enlarged portacaval nodes, largest 1.4 cm (series 2/image 23). Mildly enlarged 1.0 cm aortocaval node (series 2/image 35). No pelvic adenopathy. Reproductive: Mildly enlarged prostate. Other: No pneumoperitoneum, ascites or focal fluid collection. Musculoskeletal: No aggressive appearing focal osseous lesions. Status post posterior spinal fixation bilaterally at L4-5. Moderate thoracolumbar spondylosis. IMPRESSION: 1. Poorly marginated heterogeneous hypodense 2.9 cm pancreatic mass at the uncinate process, worrisome for primary pancreatic adenocarcinoma. No biliary or pancreatic duct dilation. MRI abdomen without and with  IV contrast is indicated for further evaluation. 2. Several ill-defined hypodense liver masses scattered throughout the liver, largest 3.1 cm in the segment 6 right liver lobe, worrisome for liver  metastases. 3. Nonspecific portacaval and aortocaval adenopathy, worrisome for metastatic nodes. 4. 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 Additional tiny pulmonary nodules scattered at the right lung base are indeterminate and should be reassessed on follow-up chest CT in 3 months. 5. Mild prostatomegaly. 6.  Aortic Atherosclerosis (ICD10-I70.0). These results will be called to the ordering clinician or representative by the Radiologist Assistant, and communication documented in the PACS or zVision Dashboard. Electronically Signed   By: Ilona Sorrel M.D.   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 EXAM: NUCLEAR MEDICINE PET SKULL BASE TO THIGH TECHNIQUE: 11.0 mCi F-18 FDG was injected intravenously. Full-ring PET imaging was performed from the skull base to thigh after the radiotracer. CT data was obtained and used for attenuation correction and anatomic localization. Fasting blood glucose: 123 mg/dl COMPARISON:  05/16/2019 CT abdomen/pelvis. 05/10/2019 screening chest CT. FINDINGS: Mediastinal blood pool activity: SUV max 3.2 Liver activity: SUV max NA NECK: No hypermetabolic lymph nodes in the neck. Incidental CT findings: none CHEST: Bilateral hypermetabolic hilar foci with max SUV 5.2 on the left and 3.5 on the right. No enlarged or hypermetabolic axillary or mediastinal lymph nodes. No hypermetabolic pulmonary findings. A few scattered solid right pulmonary nodules, largest 4 mm in the basilar right lower lobe (series 3/image 125), below PET resolution. The pleural based solid density 4.3 x 2.3 cm structure at the extreme medial left lung base (series 3/image 129) is non hypermetabolic. Incidental CT findings: Three-vessel coronary atherosclerosis status post CABG. Atherosclerotic nonaneurysmal  thoracic aorta. Dilated main pulmonary artery (3.6 cm diameter). Intact sternotomy wires. ABDOMEN/PELVIS: Intensely hypermetabolic uncinate process pancreatic mass measuring approximately 4.2 x 2.6 cm with max SUV 14.4 (series 3/image 163). Enlarged hypermetabolic 1.4 cm portacaval node with max SUV 10.5 (series 3/image 155). Several small hypermetabolic aortocaval and left para-aortic lymph nodes, largest 1.0 cm in the aortocaval chain with max SUV 6.2 (series 3/image 171). Several (at least 5) hypermetabolic liver masses scattered throughout the liver, largest 3.5 cm in the segment 6 right liver lobe with max SUV 12.6 (series 3/image 152), 2.4 cm in the central segment 5 right liver lobe with max SUV 13.6 (series 3/image 147) and 2.3 cm in posterior right liver lobe adjacent to the IVC with max SUV 10.9 (series 3/image 147). No abnormal hypermetabolic activity within the adrenal glands or spleen. No hypermetabolic lymph nodes in the pelvis. Incidental CT findings: Cholecystectomy. Simple 4.6 cm lower right renal cyst. Simple 1.5 cm interpolar left renal cyst. Atherosclerotic nonaneurysmal abdominal aorta. SKELETON: Faint hypermetabolism in the medial right iliac bone with associated faint sclerosis with max SUV 3.3 (series 3/image 215). No additional hypermetabolic osseous foci. Incidental CT findings: none IMPRESSION: 1. Intensely hypermetabolic uncinate process pancreatic mass, compatible with primary pancreatic adenocarcinoma. 2. Hypermetabolic portacaval, aortocaval and left para-aortic nodal metastases. Bilateral hilar nodal hypermetabolism is also compatible with nodal metastatic disease. 3. Several (at least 5) hypermetabolic liver metastases scattered throughout the liver. 4. Equivocal faint hypermetabolism in the medial right iliac bone with associated faint sclerosis, osseous metastasis not excluded, although potentially degenerative. 5. Tiny scattered right pulmonary nodules, below PET resolution,  recommend attention on follow-up chest CT in 3-6 months. 6. Chronic findings include: Aortic Atherosclerosis (ICD10-I70.0). Dilated main pulmonary artery, suggesting pulmonary arterial hypertension. Electronically Signed   By: Ilona Sorrel M.D.   On: 06/04/2019 13:56   US Biopsy (liver)  Result Date: 05/21/2019 INDICATION: Multiple liver lesions. EXAM: ULTRASOUND DIRECTED LIVER BIOPSY. MEDICATIONS: None. ANESTHESIA/SEDATION: Moderate (conscious) sedation was employed during this procedure. A total of Versed 4.5 mg and Fentanyl 100 mcg was administered intravenously. Moderate Sedation Time: 37 minutes. The patient's level of consciousness and vital signs were monitored continuously by radiology nursing throughout the procedure under my direct supervision. FLUOROSCOPY TIME:  Fluoroscopy Time: 0 COMPLICATIONS: None immediate. PROCEDURE: After discussing the risks and benefits of this procedure with the patient informed consent was obtained. Abdomen sterilely prepped and draped. Local anesthesia administered 1% lidocaine. IV conscious sedation performed as above. Ultrasound liver biopsy was performed with a 18 gauge needle. Two good samples obtained. No complications. Post procedure patient stable. Discharge instructions given. Follow-up with patient's physician. IMPRESSION: Successful ultrasound directed liver mass biopsy. Electronically Signed   By: Marcello Moores  Register   On: 05/21/2019 09:55   Ct Biopsy  Result Date: 05/24/2019 INDICATION: Probable liver metastasis. EXAM: CT DIRECTED LIVER MASS BIOPSY MEDICATIONS: None. ANESTHESIA/SEDATION: Moderate (conscious) sedation was employed during this procedure. A total of Versed 3.0 mg and Fentanyl 50.0 mcg was administered intravenously. Moderate Sedation Time: 15 minutes. The patient's level of consciousness and vital signs were monitored continuously by radiology nursing throughout the procedure under my direct supervision. FLUOROSCOPY TIME:  Fluoroscopy Time: 9  COMPLICATIONS: None immediate. PROCEDURE: After discussing the risks and benefits of this procedure with the patient informed consent was obtained local anesthesia administered 1% lidocaine. Under CT guidance CT-directed liver mass biopsy was performed without difficulty. Two 13 mm 18 gauge core samples obtained and sent to pathology. Post biopsy CT revealed no complications. Patient was transferred to specials recovery. Patient was evaluated in specials recovery and noted to be stable. Discharge instructions were given to him and his wife. Follow-up is with oncology. IMPRESSION: Successful CT directed liver mass biopsy. Electronically Signed   By: Marcello Moores  Register   On: 05/24/2019 12:22   Ct Chest Lung Cancer Screening Low Dose Wo Contrast  Result Date: 05/10/2019 CLINICAL DATA:  73 year old male with 38 pack-year history of smoking. Lung cancer screening. EXAM: CT CHEST WITHOUT CONTRAST LOW-DOSE FOR LUNG CANCER SCREENING TECHNIQUE: Multidetector CT imaging of the chest was performed following the standard protocol without IV contrast. COMPARISON:  None. FINDINGS: Cardiovascular: The heart size is normal. No substantial pericardial effusion. Status post CABG. Atherosclerotic calcification is noted in the wall of the thoracic aorta. Mediastinum/Nodes: No mediastinal lymphadenopathy. No evidence for gross hilar lymphadenopathy although assessment is limited by the lack of intravenous contrast on today's study. The esophagus has normal imaging features. There is no axillary lymphadenopathy. Lungs/Pleura: Numerous tiny pulmonary nodules are seen scattered in both lungs, measuring up to a maximum volume derived equivalent diameter of 4.4 mm. No overtly suspicious nodule or mass. No pleural effusion. Centrilobular emphsyema noted. Upper Abdomen: Unremarkable. Musculoskeletal: No worrisome lytic or sclerotic osseous abnormality. Centrilobular emphsyema noted. IMPRESSION: 1. Lung-RADS 2, benign appearance or behavior.  Continue annual screening with low-dose chest CT without contrast in 12 months. 2.  Aortic Atherosclerois (ICD10-170.0) 3.  Emphysema. [id number]) Electronically Signed   By: Misty Stanley M.D.   On: 05/10/2019 17:31      ASSESSMENT & PLAN:  1. Pancreatic cancer metastasized to liver [facility])  2. Port-A-Cath in place   3. Liver metastasis ([place])   4. Goals of care, counseling/discussion   5. Encounter for antineoplastic chemotherapy   6. Normocytic anemia    #Metastatic pancreatic cancer, Labs reviewed and discussed with patient Lab counts acceptable to proceed with cycle 1 gemcitabine and Abraxane treatments. Patient reports that he has antiemetics at home and RN Tillie Rung has went over medication instructions with him.  #Normocytic anemia, hemoglobin 10.1, continue to monitor. History of IDA, on ferrous sulfate.  #Patient has establish care with palliative care service.  Supportive care measures are necessary for patient well-being and will be provided as necessary. We spent sufficient time to discuss many aspect of care, questions were answered to patient's satisfaction. Orders Placed This Encounter  Procedures   Cancer antigen 19-9    Standing Status:   Future    Standing Expiration Date:   06/06/2020    All questions were answered. The patient knows to call the clinic with any problems questions or concerns.  cc Trinna Post, PA-C   Return of visit: Day 1 of chemotherapy for assessment.  Earlie Server, MD, PhD Hematology Oncology [place] at [facility] 06/07/2019

## 2019-06-11 ENCOUNTER — Telehealth: Payer: Self-pay

## 2019-06-11 NOTE — Telephone Encounter (Signed)
Telephone call to patient for follow up after first chemo infusion he received on 06/07/2019.  Pt states infusion went great.  States feeling good and no side effects of chemo.  Patient thanked me for calling and said he has the "magnet" on his fridge if he has any concerns he will call.

## 2019-06-12 ENCOUNTER — Other Ambulatory Visit: Payer: Self-pay

## 2019-06-12 ENCOUNTER — Ambulatory Visit: Payer: Medicare Other | Admitting: Gastroenterology

## 2019-06-12 VITALS — BP 108/62 | HR 83 | Temp 98.2°F | Ht 69.0 in | Wt 191.4 lb

## 2019-06-12 DIAGNOSIS — D509 Iron deficiency anemia, unspecified: Secondary | ICD-10-CM | POA: Diagnosis not present

## 2019-06-12 NOTE — Progress Notes (Signed)
Jonathon Bellows MD, MRCP(U.K) [address]  [place]  [place], [place] [address]  Main: [phone]  Fax: [phone]   Gastroenterology Consultation  Referring Provider:     Paulene Floor Primary Care Physician:  Paulene Floor Primary Gastroenterologist:  Dr. Jonathon Bellows  Reason for Consultation:     Iron deficiency anemia        HPI:   Craig [name] is a 73 y.o. y/o male referred for consultation & management  by Dr. Terrilee Croak, Wendee Beavers, PA-C.    He has been referred for iron deficiency anemia.  History of CABG, former tobacco user and history of alcohol abuse.  He sees Dr. Tasia Catchings for possible pancreatic cancer with liver metastasis based on CT-guided liver biopsy on 05/24/2019.  He has been on chemotherapy.  He has been evaluated by palliative care.  It appears that the referral for iron deficiency anemia was made in July 2020.  Recent labs on 06/07/2019 demonstrates a hemoglobin 10 g with an MCV of 85.5.  CA-19-9 is greater than 9000 colonoscopy back in 2011 demonstrates internal hemorrhoids.  Iron studies 1 month back are normalized.  He has been on iron supplementation.  He denies any overt blood loss in his stool or urine.  Denies any complaints presently.  He is not aware of his recent PET scan results.  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   LIMA to LAD SVG to [place] SVG SEQUENTIALLY to OM1 and OM2 SVG to ACUTE MARGINAL   Spinal  stenosis of cervical region    Vitamin D deficiency     Past Surgical History:  Procedure Laterality Date   BACK SURGERY  2018    fusion with screws   CHOLECYSTECTOMY N/A 11/13/2018   Procedure: LAPAROSCOPIC CHOLECYSTECTOMY;  Surgeon: Vickie Epley, MD;  Location: [facility] ORS;  Service: General;  Laterality: N/A;   COLONOSCOPY     CORONARY ARTERY BYPASS GRAFT N/A 09/30/2017   Procedure: CORONARY ARTERY BYPASS GRAFTING times five using right and left Saphaneous vein harvested endoscopicly  and left internal mammary artery. (CABG),TEE;  Surgeon: Rexene Alberts, MD;  Location: [place];  Service: Open Heart Surgery;  Laterality: N/A;   LEFT HEART CATH AND CORONARY ANGIOGRAPHY Left 09/06/2017   Procedure: LEFT HEART CATH AND CORONARY ANGIOGRAPHY;  Surgeon: Corey Skains, MD;  Location: [place] CV [facility];  Service: Cardiovascular;  Laterality: Left;   percutaneous transluminal balloon angioplasty  01/2010   of left lower extremity   PORTA CATH INSERTION N/A 06/06/2019   Procedure: PORTA CATH INSERTION;  Surgeon: Katha Cabal, MD;  Location: [place] [facility];  Service: Cardiovascular;  Laterality: N/A;   TEE WITHOUT CARDIOVERSION N/A 09/30/2017   Procedure: TRANSESOPHAGEAL ECHOCARDIOGRAM (TEE);  Surgeon: Rexene Alberts, MD;  Location: [place];  Service: Open Heart Surgery;  Laterality:  N/A;   VASCULAR SURGERY Left 2010   left exernal iliac and superficial femoral artery PTCA and stenting    Prior to Admission medications   Medication Sig Start Date End Date Taking? Authorizing Provider  acetaminophen (TYLENOL) 500 MG tablet Take 2 tablets (1,000 mg total) by mouth every 6 (six) hours. Patient taking differently: Take 1,000 mg by mouth every 6 (six) hours as needed for moderate pain.  10/05/17   [name], PA-C  aspirin EC 81 MG tablet Take 81 mg by mouth daily.    [provider]  carvedilol (COREG) 6.25 MG tablet Take 6.25 mg by mouth 2 (two) times daily  with a meal. 03/01/18   [provider]  diltiazem (CARDIZEM CD) 300 MG 24 hr capsule Take 1 capsule (300 mg total) by mouth daily. 01/22/19   Mar Daring, PA-C  dorzolamide-timolol (COSOPT) 22.3-6.8 MG/ML ophthalmic solution INSTILL ONE DROP INTO BOTH EYES TWICE DAILY 11/08/18   [provider]  ferrous sulfate 325 (65 FE) MG tablet Take 325 mg by mouth daily with breakfast.    [provider]  gabapentin (NEURONTIN) 300 MG capsule Take 2 capsules (600 mg total) by mouth 3 (three) times daily. 04/20/19   Trinna Post, PA-C  hydrochlorothiazide (HYDRODIURIL) 25 MG tablet Take 1 tablet (25 mg total) by mouth daily. 08/24/18   Carmon Ginsberg, PA  latanoprost (XALATAN) 0.005 % ophthalmic solution Place 1 drop into both eyes at bedtime.  07/15/18   [provider]  lidocaine-prilocaine (EMLA) cream Apply to affected area once 06/01/19   Earlie Server, MD  Multiple Vitamin (MULTIVITAMIN WITH MINERALS) TABS tablet Take 1 tablet by mouth daily.    [provider]  OMEGA-3 FATTY ACIDS PO Take 1,200 mg by mouth daily.     [provider]  omeprazole (PRILOSEC) 40 MG capsule TAKE 1 CAPSULE BY MOUTH EVERY DAY 05/17/19   Carles Collet M, PA-C  ondansetron (ZOFRAN) 8 MG tablet Take 1 tablet (8 mg total) by mouth 2 (two) times daily as needed (Nausea or vomiting). Patient not taking: Reported on 06/01/2019 06/01/19   Earlie Server, MD  prochlorperazine (COMPAZINE) 10 MG tablet Take 1 tablet (10 mg total) by mouth every 6 (six) hours as needed (Nausea or vomiting). Patient not taking: Reported on 06/01/2019 06/01/19   Earlie Server, MD  rosuvastatin (CRESTOR) 10 MG tablet Take 1 tablet (10 mg total) by mouth daily. 04/27/19   Trinna Post, PA-C  sucralfate (CARAFATE) 1 g tablet Take 1 tablet (1 g total) by mouth 4 (four) times daily -  with meals and at bedtime. Patient not taking: Reported on 06/06/2019 05/15/19   Trinna Post, PA-C  triamcinolone cream (KENALOG)  0.1 % APPLY 1 APPLICATION TOPICALLY 2 (TWO) TIMES DAILY. TO EXTERNAL EAR 11/10/18   Carmon Ginsberg, PA  valsartan (DIOVAN) 160 MG tablet Take 1 tablet (160 mg total) by mouth daily. 03/09/19   Chrismon, Vickki Muff, PA    Family History  Problem Relation Age of Onset   Brain cancer Mother    Emphysema Father    Cancer Brother      Social History   Tobacco Use   Smoking status: Former Smoker    Packs/day: 0.75    Years: 50.00    Pack years: 37.50    Types: Cigarettes    Quit date: 09/27/2017    Years since quitting: 1.7   Smokeless tobacco: Former Systems developer    Types: Chew  Substance Use Topics  Alcohol use: Not Currently    Comment: stopped drinking before cabg   Drug use: Not Currently    Types: Marijuana    Comment: stopped smoking before CABG    Allergies as of 06/12/2019 - Review Complete 06/12/2019  Allergen Reaction Noted   Lipitor [atorvastatin] Other (See Comments) 05/02/2015   Zetia [ezetimibe] Other (See Comments) 05/02/2015   Lisinopril Cough 05/02/2015   Protonix [pantoprazole] Other (See Comments) 05/02/2015   Spironolactone Other (See Comments) 06/28/2018    Review of Systems:    All systems reviewed and negative except where noted in HPI.   Physical Exam:  BP 108/62    Pulse 83    Temp 98.2 F (36.8 C)    Ht 5\' 9"  (1.753 m)    Wt 191 lb 6.4 oz (86.8 kg)    BMI 28.26 kg/m  No LMP for male patient. Psych:  Alert and cooperative. Normal mood and affect. General:   Alert,  Well-developed, well-nourished, pleasant and cooperative in NAD Head:  Normocephalic and atraumatic. Eyes:  Sclera clear, no icterus.   Conjunctiva pink. Ears:  Normal auditory acuity. Nose:  No deformity, discharge, or lesions. Mouth:  No deformity or lesions,oropharynx pink & moist. Neck:  Supple; no masses or thyromegaly. Lungs:  Respirations even and unlabored.  Clear throughout to auscultation.   No wheezes, crackles, or rhonchi. No acute distress. Heart:  Regular rate and  rhythm; no murmurs, clicks, rubs, or gallops. Abdomen:  Normal bowel sounds.  No bruits.  Soft, non-tender and non-distended without masses, hepatosplenomegaly or hernias noted.  No guarding or rebound tenderness.    Neurologic:  Alert and oriented x3;  grossly normal neurologically. Skin:  Intact without significant lesions or rashes. No jaundice. Lymph Nodes:  No significant cervical adenopathy. Psych:  Alert and cooperative. Normal mood and affect.  Imaging Studies: US Abdomen Complete  Result Date: 05/15/2019 CLINICAL DATA:  73 year old male with right upper quadrant abdominal pain. Cholecystectomy in 2019. EXAM: ABDOMEN ULTRASOUND COMPLETE COMPARISON:  Chest CT 05/10/2019. Ultrasound 07/24/2018. Lumbar MRI 01/15/2017. FINDINGS: Gallbladder: Surgically absent. Common bile duct: Diameter: 2 millimeters, normal. Liver: Around hypoechoic lesion in the right lobe on image 15 measures up to 20 millimeters diameter. 3 smaller round hypoechoic lesions are suggested in the left lobe (image 25) and central right lobe (image 26). These were not evident in 2019. Background liver echogenicity is within normal limits. Portal vein is patent on color Doppler imaging with normal direction of blood flow towards the liver. IVC: No abnormality visualized. Pancreas: Incompletely visualized due to overlying bowel gas, visualized portions within normal limits. Spleen: Size and appearance within normal limits. Right Kidney: Length: 11.8 centimeters. Chronic left renal lower pole cyst appears not significantly changed since 2018 and benign with no vascular elements (image 47). No right hydronephrosis. Left Kidney: Length: 12.8 centimeters. Stable small benign appearing midpole cyst since 2018, 19 millimeters. No left hydronephrosis. Abdominal aorta: No aneurysm visualized. Other findings: No free fluid. IMPRESSION: 1. Multiple new round hypoechoic liver lesions which were not evident on the 2019 ultrasound range from 8 mm to  20 mm diameter. These are nonspecific but suspicious for hepatic metastatic disease. Given that Chest CT was just recently performed on 05/10/2019, recommend follow-up CT Abdomen and Pelvis with oral and IV contrast. 2. Otherwise negative abdomen ultrasound status post cholecystectomy. These results will be called to the ordering clinician or representative by the Radiologist Assistant, and communication documented in the PACS or zVision Dashboard. Electronically Signed   By:  Genevie Ann M.D.   On: 05/15/2019 14:02   Ct Abdomen Pelvis W Contrast  Result Date: 05/16/2019 CLINICAL DATA:  Heavy alcohol abuse. Abdominal pain for 10 days. Indeterminate hypoechoic liver lesions on recent sonogram. EXAM: CT ABDOMEN AND PELVIS WITH CONTRAST TECHNIQUE: Multidetector CT imaging of the abdomen and pelvis was performed using the standard protocol following bolus administration of intravenous contrast. CONTRAST:  119mL OMNIPAQUE IOHEXOL 300 MG/ML  SOLN COMPARISON:  05/15/2019 abdominal sonogram. FINDINGS: Lower chest: There is a 4.4 x 2.6 cm pleural based mass at the extreme medial basilar left lung base (series 2/image 12). A few scattered tiny solid pulmonary nodules at the right lung base, largest 4 mm in the right lower lobe (series 3/image 11). Coronary atherosclerosis. Visualized lower sternotomy wires are intact. Hepatobiliary: There are several (at least 8) ill-defined hypodense liver masses scattered throughout the liver, largest 3.1 x 2.7 cm in segment 6 right liver lobe (series 2/image 25) and 1.5 x 1.1 cm in the segment 7 right liver lobe (series 2/image 20). Cholecystectomy. No biliary ductal dilatation. Pancreas: There is a poorly marginated heterogeneous hypodense pancreatic mass at the uncinate process measuring approximately 2.9 x 2.7 cm (series 2/image 31). This mass abuts approximately 30-40% of the circumference of the proximal SMA posteriorly (series 2/image 29). The mass encases a tributary of the SMV  (series 2/image 33). No involvement by the mass of the portosplenic venous confluence or main portal vein. No pancreatic duct dilation. Spleen: Normal size. No mass. Adrenals/Urinary Tract: Normal adrenals. Simple bilateral renal cysts, largest 4.5 cm in the lower right kidney. Scattered subcentimeter hypodense renal cortical lesions in both kidneys are too small to characterize. No hydronephrosis. Normal bladder. Stomach/Bowel: Normal non-distended stomach. Normal caliber small bowel with no small bowel wall thickening. Normal appendix. Normal large bowel with no diverticulosis, large bowel wall thickening or pericolonic fat stranding. Vascular/Lymphatic: Atherosclerotic nonaneurysmal abdominal aorta. Patent portal, splenic, hepatic and renal veins. A few mildly enlarged portacaval nodes, largest 1.4 cm (series 2/image 23). Mildly enlarged 1.0 cm aortocaval node (series 2/image 35). No pelvic adenopathy. Reproductive: Mildly enlarged prostate. Other: No pneumoperitoneum, ascites or focal fluid collection. Musculoskeletal: No aggressive appearing focal osseous lesions. Status post posterior spinal fixation bilaterally at L4-5. Moderate thoracolumbar spondylosis. IMPRESSION: 1. Poorly marginated heterogeneous hypodense 2.9 cm pancreatic mass at the uncinate process, worrisome for primary pancreatic adenocarcinoma. No biliary or pancreatic duct dilation. MRI abdomen without and with IV contrast is indicated for further evaluation. 2. Several ill-defined hypodense liver masses scattered throughout the liver, largest 3.1 cm in the segment 6 right liver lobe, worrisome for liver metastases. 3. Nonspecific portacaval and aortocaval adenopathy, worrisome for metastatic nodes. 4. 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 Additional tiny pulmonary nodules scattered at the right lung base are indeterminate and should be reassessed on follow-up chest CT in 3 months. 5. Mild  prostatomegaly. 6.  Aortic Atherosclerosis (ICD10-I70.0). These results will be called to the ordering clinician or representative by the Radiologist Assistant, and communication documented in the PACS or zVision Dashboard. Electronically Signed   By: Ilona Sorrel M.D.   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 EXAM: NUCLEAR MEDICINE PET SKULL BASE TO THIGH TECHNIQUE: 11.0 mCi F-18 FDG was injected intravenously. Full-ring PET imaging was performed from the skull base  to thigh after the radiotracer. CT data was obtained and used for attenuation correction and anatomic localization. Fasting blood glucose: 123 mg/dl COMPARISON:  05/16/2019 CT abdomen/pelvis. 05/10/2019 screening chest CT. FINDINGS: Mediastinal blood pool activity: SUV max 3.2 Liver activity: SUV max NA NECK: No hypermetabolic lymph nodes in the neck. Incidental CT findings: none CHEST: Bilateral hypermetabolic hilar foci with max SUV 5.2 on the left and 3.5 on the right. No enlarged or hypermetabolic axillary or mediastinal lymph nodes. No hypermetabolic pulmonary findings. A few scattered solid right pulmonary nodules, largest 4 mm in the basilar right lower lobe (series 3/image 125), below PET resolution. The pleural based solid density 4.3 x 2.3 cm structure at the extreme medial left lung base (series 3/image 129) is non hypermetabolic. Incidental CT findings: Three-vessel coronary atherosclerosis status post CABG. Atherosclerotic nonaneurysmal thoracic aorta. Dilated main pulmonary artery (3.6 cm diameter). Intact sternotomy wires. ABDOMEN/PELVIS: Intensely hypermetabolic uncinate process pancreatic mass measuring approximately 4.2 x 2.6 cm with max SUV 14.4 (series 3/image 163). Enlarged hypermetabolic 1.4 cm portacaval node with max SUV 10.5 (series 3/image 155). Several small  hypermetabolic aortocaval and left para-aortic lymph nodes, largest 1.0 cm in the aortocaval chain with max SUV 6.2 (series 3/image 171). Several (at least 5) hypermetabolic liver masses scattered throughout the liver, largest 3.5 cm in the segment 6 right liver lobe with max SUV 12.6 (series 3/image 152), 2.4 cm in the central segment 5 right liver lobe with max SUV 13.6 (series 3/image 147) and 2.3 cm in posterior right liver lobe adjacent to the IVC with max SUV 10.9 (series 3/image 147). No abnormal hypermetabolic activity within the adrenal glands or spleen. No hypermetabolic lymph nodes in the pelvis. Incidental CT findings: Cholecystectomy. Simple 4.6 cm lower right renal cyst. Simple 1.5 cm interpolar left renal cyst. Atherosclerotic nonaneurysmal abdominal aorta. SKELETON: Faint hypermetabolism in the medial right iliac bone with associated faint sclerosis with max SUV 3.3 (series 3/image 215). No additional hypermetabolic osseous foci. Incidental CT findings: none IMPRESSION: 1. Intensely hypermetabolic uncinate process pancreatic mass, compatible with primary pancreatic adenocarcinoma. 2. Hypermetabolic portacaval, aortocaval and left para-aortic nodal metastases. Bilateral hilar nodal hypermetabolism is also compatible with nodal metastatic disease. 3. Several (at least 5) hypermetabolic liver metastases scattered throughout the liver. 4. Equivocal faint hypermetabolism in the medial right iliac bone with associated faint sclerosis, osseous metastasis not excluded, although potentially degenerative. 5. Tiny scattered right pulmonary nodules, below PET resolution, recommend attention on follow-up chest CT in 3-6 months. 6. Chronic findings include: Aortic Atherosclerosis (ICD10-I70.0). Dilated main pulmonary artery, suggesting pulmonary arterial hypertension. Electronically Signed   By: Ilona Sorrel M.D.   On: 06/04/2019 13:56   US Biopsy (liver)  Result Date: 05/21/2019 INDICATION: Multiple liver  lesions. EXAM: ULTRASOUND DIRECTED LIVER BIOPSY. MEDICATIONS: None. ANESTHESIA/SEDATION: Moderate (conscious) sedation was employed during this procedure. A total of Versed 4.5 mg and Fentanyl 100 mcg was administered intravenously. Moderate Sedation Time: 37 minutes. The patient's level of consciousness and vital signs were monitored continuously by radiology nursing throughout the procedure under my direct supervision. FLUOROSCOPY TIME:  Fluoroscopy Time: 0 COMPLICATIONS: None immediate. PROCEDURE: After discussing the risks and benefits of this procedure with the patient informed consent was obtained. Abdomen sterilely prepped and draped. Local anesthesia administered 1% lidocaine. IV conscious sedation performed as above. Ultrasound liver biopsy was performed with a 18 gauge needle. Two good samples obtained. No complications. Post procedure patient stable. Discharge instructions given. Follow-up with patient's physician. IMPRESSION: Successful ultrasound directed liver mass  biopsy. Electronically Signed   By: Marcello Moores  Register   On: 05/21/2019 09:55   Ct Biopsy  Result Date: 05/24/2019 INDICATION: Probable liver metastasis. EXAM: CT DIRECTED LIVER MASS BIOPSY MEDICATIONS: None. ANESTHESIA/SEDATION: Moderate (conscious) sedation was employed during this procedure. A total of Versed 3.0 mg and Fentanyl 50.0 mcg was administered intravenously. Moderate Sedation Time: 15 minutes. The patient's level of consciousness and vital signs were monitored continuously by radiology nursing throughout the procedure under my direct supervision. FLUOROSCOPY TIME:  Fluoroscopy Time: 9 COMPLICATIONS: None immediate. PROCEDURE: After discussing the risks and benefits of this procedure with the patient informed consent was obtained local anesthesia administered 1% lidocaine. Under CT guidance CT-directed liver mass biopsy was performed without difficulty. Two 13 mm 18 gauge core samples obtained and sent to pathology. Post  biopsy CT revealed no complications. Patient was transferred to specials recovery. Patient was evaluated in specials recovery and noted to be stable. Discharge instructions were given to him and his wife. Follow-up is with oncology. IMPRESSION: Successful CT directed liver mass biopsy. Electronically Signed   By: Marcello Moores  Register   On: 05/24/2019 12:22    Assessment and Plan:   Yegor Bech is a 73 y.o. y/o male has been referred for iron deficiency anemia back in July 2020.  He has recently been diagnosed with metastatic pancreatic cancer.  I discussed with him that in view of the metastatic pancreatic cancer I do not feel that it would be appropriate to evaluate him for iron deficiency anemia at this point of time.  His hemoglobin has been stable and in fact he responded to oral iron with normalization of his iron studies.  The benefits of this procedure are probably outweighed by the risks and overall goals of care.  I informed him that I will discuss with Dr. Tasia Catchings and if felt that it is recommended to proceed with endoscopy I will have my office contact him.  He also mentioned he was not aware of the recent PET scan results.  I will inform Dr. Collie Siad office to go over it once again with him  Follow up in as needed  Dr Jonathon Bellows MD,MRCP(U.K)

## 2019-06-13 ENCOUNTER — Other Ambulatory Visit: Payer: Self-pay | Admitting: Family Medicine

## 2019-06-13 DIAGNOSIS — R5383 Other fatigue: Secondary | ICD-10-CM

## 2019-06-13 DIAGNOSIS — D649 Anemia, unspecified: Secondary | ICD-10-CM

## 2019-06-13 NOTE — Progress Notes (Signed)
Patient is here for follow up, he is doing well no complaints  

## 2019-06-14 ENCOUNTER — Inpatient Hospital Stay: Payer: Medicare Other | Attending: Oncology

## 2019-06-14 ENCOUNTER — Inpatient Hospital Stay: Payer: Medicare Other

## 2019-06-14 ENCOUNTER — Inpatient Hospital Stay (HOSPITAL_BASED_OUTPATIENT_CLINIC_OR_DEPARTMENT_OTHER): Payer: Medicare Other | Admitting: Oncology

## 2019-06-14 ENCOUNTER — Encounter: Payer: Self-pay | Admitting: Oncology

## 2019-06-14 ENCOUNTER — Other Ambulatory Visit: Payer: Self-pay

## 2019-06-14 VITALS — BP 117/47 | HR 79 | Temp 97.8°F | Wt 190.0 lb

## 2019-06-14 DIAGNOSIS — R634 Abnormal weight loss: Secondary | ICD-10-CM | POA: Diagnosis not present

## 2019-06-14 DIAGNOSIS — C259 Malignant neoplasm of pancreas, unspecified: Secondary | ICD-10-CM | POA: Insufficient documentation

## 2019-06-14 DIAGNOSIS — E785 Hyperlipidemia, unspecified: Secondary | ICD-10-CM | POA: Insufficient documentation

## 2019-06-14 DIAGNOSIS — E869 Volume depletion, unspecified: Secondary | ICD-10-CM | POA: Insufficient documentation

## 2019-06-14 DIAGNOSIS — C787 Secondary malignant neoplasm of liver and intrahepatic bile duct: Secondary | ICD-10-CM | POA: Diagnosis not present

## 2019-06-14 DIAGNOSIS — R160 Hepatomegaly, not elsewhere classified: Secondary | ICD-10-CM | POA: Insufficient documentation

## 2019-06-14 DIAGNOSIS — K1231 Oral mucositis (ulcerative) due to antineoplastic therapy: Secondary | ICD-10-CM | POA: Insufficient documentation

## 2019-06-14 DIAGNOSIS — N281 Cyst of kidney, acquired: Secondary | ICD-10-CM | POA: Insufficient documentation

## 2019-06-14 DIAGNOSIS — R739 Hyperglycemia, unspecified: Secondary | ICD-10-CM | POA: Insufficient documentation

## 2019-06-14 DIAGNOSIS — Z5111 Encounter for antineoplastic chemotherapy: Secondary | ICD-10-CM

## 2019-06-14 DIAGNOSIS — I110 Hypertensive heart disease with heart failure: Secondary | ICD-10-CM | POA: Diagnosis not present

## 2019-06-14 DIAGNOSIS — R197 Diarrhea, unspecified: Secondary | ICD-10-CM | POA: Insufficient documentation

## 2019-06-14 DIAGNOSIS — R599 Enlarged lymph nodes, unspecified: Secondary | ICD-10-CM | POA: Insufficient documentation

## 2019-06-14 DIAGNOSIS — D649 Anemia, unspecified: Secondary | ICD-10-CM | POA: Diagnosis not present

## 2019-06-14 DIAGNOSIS — I700 Atherosclerosis of aorta: Secondary | ICD-10-CM | POA: Diagnosis not present

## 2019-06-14 DIAGNOSIS — K869 Disease of pancreas, unspecified: Secondary | ICD-10-CM | POA: Diagnosis not present

## 2019-06-14 DIAGNOSIS — I739 Peripheral vascular disease, unspecified: Secondary | ICD-10-CM | POA: Insufficient documentation

## 2019-06-14 DIAGNOSIS — Z87891 Personal history of nicotine dependence: Secondary | ICD-10-CM | POA: Insufficient documentation

## 2019-06-14 DIAGNOSIS — G893 Neoplasm related pain (acute) (chronic): Secondary | ICD-10-CM | POA: Insufficient documentation

## 2019-06-14 DIAGNOSIS — T451X5A Adverse effect of antineoplastic and immunosuppressive drugs, initial encounter: Secondary | ICD-10-CM | POA: Diagnosis not present

## 2019-06-14 DIAGNOSIS — Z825 Family history of asthma and other chronic lower respiratory diseases: Secondary | ICD-10-CM | POA: Insufficient documentation

## 2019-06-14 DIAGNOSIS — N400 Enlarged prostate without lower urinary tract symptoms: Secondary | ICD-10-CM | POA: Insufficient documentation

## 2019-06-14 DIAGNOSIS — D6481 Anemia due to antineoplastic chemotherapy: Secondary | ICD-10-CM | POA: Diagnosis not present

## 2019-06-14 DIAGNOSIS — G629 Polyneuropathy, unspecified: Secondary | ICD-10-CM | POA: Insufficient documentation

## 2019-06-14 DIAGNOSIS — R748 Abnormal levels of other serum enzymes: Secondary | ICD-10-CM | POA: Diagnosis not present

## 2019-06-14 DIAGNOSIS — R109 Unspecified abdominal pain: Secondary | ICD-10-CM | POA: Insufficient documentation

## 2019-06-14 DIAGNOSIS — Z809 Family history of malignant neoplasm, unspecified: Secondary | ICD-10-CM | POA: Insufficient documentation

## 2019-06-14 DIAGNOSIS — C772 Secondary and unspecified malignant neoplasm of intra-abdominal lymph nodes: Secondary | ICD-10-CM | POA: Diagnosis not present

## 2019-06-14 DIAGNOSIS — I2510 Atherosclerotic heart disease of native coronary artery without angina pectoris: Secondary | ICD-10-CM | POA: Insufficient documentation

## 2019-06-14 DIAGNOSIS — R918 Other nonspecific abnormal finding of lung field: Secondary | ICD-10-CM | POA: Insufficient documentation

## 2019-06-14 DIAGNOSIS — Z808 Family history of malignant neoplasm of other organs or systems: Secondary | ICD-10-CM | POA: Insufficient documentation

## 2019-06-14 DIAGNOSIS — R5383 Other fatigue: Secondary | ICD-10-CM | POA: Diagnosis not present

## 2019-06-14 DIAGNOSIS — R740 Nonspecific elevation of levels of transaminase and lactic acid dehydrogenase [LDH]: Secondary | ICD-10-CM | POA: Insufficient documentation

## 2019-06-14 DIAGNOSIS — R945 Abnormal results of liver function studies: Secondary | ICD-10-CM | POA: Insufficient documentation

## 2019-06-14 DIAGNOSIS — I959 Hypotension, unspecified: Secondary | ICD-10-CM | POA: Insufficient documentation

## 2019-06-14 DIAGNOSIS — J984 Other disorders of lung: Secondary | ICD-10-CM | POA: Insufficient documentation

## 2019-06-14 DIAGNOSIS — F1010 Alcohol abuse, uncomplicated: Secondary | ICD-10-CM | POA: Insufficient documentation

## 2019-06-14 DIAGNOSIS — K219 Gastro-esophageal reflux disease without esophagitis: Secondary | ICD-10-CM | POA: Diagnosis not present

## 2019-06-14 DIAGNOSIS — Z951 Presence of aortocoronary bypass graft: Secondary | ICD-10-CM | POA: Insufficient documentation

## 2019-06-14 DIAGNOSIS — R55 Syncope and collapse: Secondary | ICD-10-CM | POA: Diagnosis not present

## 2019-06-14 DIAGNOSIS — N179 Acute kidney failure, unspecified: Secondary | ICD-10-CM | POA: Insufficient documentation

## 2019-06-14 DIAGNOSIS — Z9049 Acquired absence of other specified parts of digestive tract: Secondary | ICD-10-CM | POA: Insufficient documentation

## 2019-06-14 DIAGNOSIS — R0981 Nasal congestion: Secondary | ICD-10-CM | POA: Insufficient documentation

## 2019-06-14 DIAGNOSIS — E860 Dehydration: Secondary | ICD-10-CM | POA: Insufficient documentation

## 2019-06-14 DIAGNOSIS — Z888 Allergy status to other drugs, medicaments and biological substances status: Secondary | ICD-10-CM | POA: Insufficient documentation

## 2019-06-14 DIAGNOSIS — Z79899 Other long term (current) drug therapy: Secondary | ICD-10-CM | POA: Insufficient documentation

## 2019-06-14 LAB — COMPREHENSIVE METABOLIC PANEL
ALT: 93 U/L — ABNORMAL HIGH (ref 0–44)
AST: 87 U/L — ABNORMAL HIGH (ref 15–41)
Albumin: 3.3 g/dL — ABNORMAL LOW (ref 3.5–5.0)
Alkaline Phosphatase: 74 U/L (ref 38–126)
Anion gap: 10 (ref 5–15)
BUN: 33 mg/dL — ABNORMAL HIGH (ref 8–23)
CO2: 21 mmol/L — ABNORMAL LOW (ref 22–32)
Calcium: 9 mg/dL (ref 8.9–10.3)
Chloride: 106 mmol/L (ref 98–111)
Creatinine, Ser: 1.39 mg/dL — ABNORMAL HIGH (ref 0.61–1.24)
GFR calc Af Amer: 58 mL/min — ABNORMAL LOW (ref >60)
GFR calc non Af Amer: 50 mL/min — ABNORMAL LOW (ref >60)
Glucose, Bld: 214 mg/dL — ABNORMAL HIGH (ref 70–99)
Potassium: 3.8 mmol/L (ref 3.5–5.1)
Sodium: 137 mmol/L (ref 135–145)
Total Bilirubin: 0.5 mg/dL (ref 0.3–1.2)
Total Protein: 7.2 g/dL (ref 6.5–8.1)

## 2019-06-14 LAB — CBC WITH DIFFERENTIAL/PLATELET
Abs Immature Granulocytes: 0.04 10*3/uL (ref 0.00–0.07)
Basophils Absolute: 0 10*3/uL (ref 0.0–0.1)
Basophils Relative: 0 %
Eosinophils Absolute: 0.2 10*3/uL (ref 0.0–0.5)
Eosinophils Relative: 3 %
HCT: 28.6 % — ABNORMAL LOW (ref 39.0–52.0)
Hemoglobin: 9 g/dL — ABNORMAL LOW (ref 13.0–17.0)
Immature Granulocytes: 1 %
Lymphocytes Relative: 16 %
Lymphs Abs: 0.7 10*3/uL (ref 0.7–4.0)
MCH: 27.1 pg (ref 26.0–34.0)
MCHC: 31.5 g/dL (ref 30.0–36.0)
MCV: 86.1 fL (ref 80.0–100.0)
Monocytes Absolute: 0.3 10*3/uL (ref 0.1–1.0)
Monocytes Relative: 6 %
Neutro Abs: 3.2 10*3/uL (ref 1.7–7.7)
Neutrophils Relative %: 74 %
Platelets: 188 10*3/uL (ref 150–400)
RBC: 3.32 MIL/uL — ABNORMAL LOW (ref 4.22–5.81)
RDW: 15.2 % (ref 11.5–15.5)
WBC: 4.4 10*3/uL (ref 4.0–10.5)
nRBC: 0 % (ref 0.0–0.2)

## 2019-06-14 LAB — RETIC PANEL
Immature Retic Fract: 17.1 % — ABNORMAL HIGH (ref 2.3–15.9)
RBC.: 3.32 MIL/uL — ABNORMAL LOW (ref 4.22–5.81)
Retic Count, Absolute: 12.9 10*3/uL — ABNORMAL LOW (ref 19.0–186.0)
Retic Ct Pct: 0.4 % (ref 0.4–3.1)
Reticulocyte Hemoglobin: 35.1 pg (ref >27.9)

## 2019-06-14 MED ORDER — SODIUM CHLORIDE 0.9 % IV SOLN
Freq: Once | INTRAVENOUS | Status: AC
  Administered 2019-06-14: 10:00:00 via INTRAVENOUS
  Filled 2019-06-14: qty 250

## 2019-06-14 MED ORDER — PACLITAXEL PROTEIN-BOUND CHEMO INJECTION 100 MG
125.0000 mg/m2 | Freq: Once | INTRAVENOUS | Status: AC
  Administered 2019-06-14: 250 mg via INTRAVENOUS
  Filled 2019-06-14: qty 50

## 2019-06-14 MED ORDER — SODIUM CHLORIDE 0.9 % IV SOLN
2000.0000 mg | Freq: Once | INTRAVENOUS | Status: AC
  Administered 2019-06-14: 2000 mg via INTRAVENOUS
  Filled 2019-06-14: qty 52.6

## 2019-06-14 MED ORDER — HEPARIN SOD (PORK) LOCK FLUSH 100 UNIT/ML IV SOLN
500.0000 [IU] | Freq: Once | INTRAVENOUS | Status: AC | PRN
  Administered 2019-06-14: 500 [IU]
  Filled 2019-06-14: qty 5

## 2019-06-14 MED ORDER — PROCHLORPERAZINE MALEATE 10 MG PO TABS
10.0000 mg | ORAL_TABLET | Freq: Once | ORAL | Status: AC
  Administered 2019-06-14: 10 mg via ORAL
  Filled 2019-06-14: qty 1

## 2019-06-14 MED ORDER — SODIUM CHLORIDE 0.9% FLUSH
10.0000 mL | Freq: Once | INTRAVENOUS | Status: AC
  Administered 2019-06-14: 10 mL via INTRAVENOUS
  Filled 2019-06-14: qty 10

## 2019-06-15 ENCOUNTER — Other Ambulatory Visit: Payer: Self-pay

## 2019-06-15 ENCOUNTER — Inpatient Hospital Stay (HOSPITAL_BASED_OUTPATIENT_CLINIC_OR_DEPARTMENT_OTHER): Payer: Medicare Other | Admitting: Oncology

## 2019-06-15 DIAGNOSIS — C259 Malignant neoplasm of pancreas, unspecified: Secondary | ICD-10-CM

## 2019-06-15 DIAGNOSIS — C787 Secondary malignant neoplasm of liver and intrahepatic bile duct: Secondary | ICD-10-CM | POA: Diagnosis not present

## 2019-06-15 DIAGNOSIS — Z5111 Encounter for antineoplastic chemotherapy: Secondary | ICD-10-CM | POA: Diagnosis not present

## 2019-06-15 MED ORDER — TRIAMCINOLONE ACETONIDE 0.5 % EX OINT: 1.0000 "application " | TOPICAL_OINTMENT | Freq: Three times a day (TID) | CUTANEOUS | 0 refills | Status: DC

## 2019-06-15 MED ORDER — PREDNISONE 50 MG PO TABS: ORAL_TABLET | ORAL | 0 refills | Status: DC

## 2019-06-15 NOTE — Progress Notes (Signed)
[place]  [phone] [phone]  Patient Care Team: Paulene Floor as PCP - General (Physician Assistant) Clent Jacks, RN as Oncology Nurse Navigator   Name of the patient: Craig [name]  [id number]  1945-11-05   Date of visit: 06/15/19  Diagnosis- Pancreatic Cancer   Chief complaint/Reason for visit- Initial Meeting for [facility], preparing for starting chemotherapy  Heme/Onc history:  Oncology History  Pancreatic cancer metastasized to liver ([place])  06/01/2019 Initial Diagnosis   Pancreatic cancer metastasized to liver [facility])   06/07/2019 -  Chemotherapy   The patient had PACLitaxel-protein bound (ABRAXANE) chemo infusion 250 mg, 125 mg/m2 = 250 mg (100 % of original dose 125 mg/m2), Intravenous,  Once, 1 of 6 cycles Dose modification: 100 mg/m2 (original dose 125 mg/m2, Cycle 1, Reason: Provider Judgment), 125 mg/m2 (original dose 125 mg/m2, Cycle 1, Reason: Provider Judgment) Administration: 250 mg (06/07/2019), 250 mg (06/14/2019) gemcitabine (GEMZAR) 2,000 mg in sodium chloride 0.9 % 250 mL chemo infusion, 2,090 mg, Intravenous,  Once, 1 of 6 cycles Administration: 2,000 mg (06/07/2019), 2,000 mg (06/14/2019)  for chemotherapy treatment.      Interval history-Craig [name] is a 73 year old male who presents to chemo care clinic today for initial meeting in preparation for starting chemotherapy. I introduced the chemo care clinic and we discussed that the role of the clinic is to assist those who are at an increased risk of emergency room visits and/or complications during the course of chemotherapy treatment. We discussed that the increased risk takes into account factors such as age, performance status, and co-morbidities. We also discussed that for some, this might include barriers to care such as not having a primary care provider, lack of insurance/transportation, or not being able to  afford medications. We discussed that the goal of the program is to help prevent unplanned ER visits and help reduce complications during chemotherapy. We do this by discussing specific risk factors to each individual and identifying ways that we can help improve these risk factors and reduce barriers to care.   ECOG FS:0 - Asymptomatic  Review of systems- Review of Systems  Constitutional: Positive for malaise/fatigue.  Skin: Positive for itching and rash.       Bilateral lower extremities, face and abdomen  Neurological: Positive for sensory change (Tingling sensation in bilateral feet and hands.).  Psychiatric/Behavioral: The patient is nervous/anxious.      Current treatment- S/p C1D8 gemcitabine and Abraxane  Allergies  Allergen Reactions   Lipitor [Atorvastatin] Other (See Comments)    MYALGIA   Zetia [Ezetimibe] Other (See Comments)    MYALGIA   Lisinopril Cough   Protonix [Pantoprazole] Other (See Comments)    headache   Spironolactone Other (See Comments)    Breast tenderness    Past Medical History:  Diagnosis Date   Allergic rhinitis    Cancer ([place])    new dx Pancreatic Cancer - Aug 2020   CHF (congestive heart failure) (HCC)    Chronic cholecystitis    Coronary artery disease    DDD (degenerative disc disease), cervical    Dyspnea    Early cataract    Erectile dysfunction    GERD (gastroesophageal reflux disease)    Gouty arthritis    Headache    occasional migraines   Hypercalcemia    Hyperlipemia    Hypertension    Palpitations    Peripheral vascular disease (HCC)    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   LIMA to LAD SVG to DISTAL CIRCUMFLEX SVG SEQUENTIALLY to OM1 and OM2 SVG to ACUTE MARGINAL   Spinal stenosis of cervical region    Vitamin D deficiency     Past Surgical History:  Procedure Laterality Date   BACK SURGERY  2018    fusion with screws   CHOLECYSTECTOMY N/A 11/13/2018   Procedure: LAPAROSCOPIC CHOLECYSTECTOMY;   Surgeon: Vickie Epley, MD;  Location: [facility] OR[facility];  Service: General;  Laterality: N/A;   COLONOSCOPY     CORONARY ARTERY BYPASS GRAFT N/A 09/30/2017   Procedure: CORONARY ARTERY BYPASS GRAFTING times five using right and left Saphaneous vein harvested endoscopicly  and left internal mammary artery. (CABG),TEE;  Surgeon: Rexene Alberts, MD;  Location: [facility];  Service: Open Heart Surgery;  Laterality: N/A;   LEFT HEART CATH AND CORONARY ANGIOGRAPHY Left 09/06/2017   Procedure: LEFT HEART CATH AND CORONARY ANGIOGRAPHY;  Surgeon: Corey Skains, MD;  Location: [place] [facility];  Service: Cardiovascular;  Laterality: Left;   percutaneous transluminal balloon angioplasty  01/2010   of left lower extremity   PORTA CATH INSERTION N/A 06/06/2019   Procedure: PORTA CATH INSERTION;  Surgeon: Katha Cabal, MD;  Location: [place] [facility];  Service: Cardiovascular;  Laterality: N/A;   TEE WITHOUT CARDIOVERSION N/A 09/30/2017   Procedure: TRANSESOPHAGEAL ECHOCARDIOGRAM (TEE);  Surgeon: Rexene Alberts, MD;  Location: [facility];  Service: Open Heart Surgery;  Laterality: N/A;   VASCULAR SURGERY Left 2010   left exernal iliac and superficial femoral artery PTCA and stenting    Social History   Socioeconomic History   Marital status: Married    Spouse name: Enid Derry   Number of children: 2   Years of education: Not on file   Highest education level: Not on file  Occupational History   Occupation: drove tractors    Comment: retired  Scientist, product/process development strain: Not hard at [employer] insecurity    Worry: Never true    Inability: Never true   Transportation needs    Medical: No    Non-medical: No  Tobacco Use   Smoking status: Former Smoker    Packs/day: 0.75    Years: 50.00    Pack years: 37.50    Types: Cigarettes    Quit date: 09/27/2017    Years since quitting: 1.7   Smokeless tobacco: Former Systems developer    Types: Chew  Substance and Sexual  Activity   Alcohol use: Not Currently    Comment: stopped drinking before cabg   Drug use: Not Currently    Types: Marijuana    Comment: stopped smoking before CABG   Sexual activity: Not Currently  Lifestyle   Physical activity    Days per week: 5 days    Minutes per session: 150+ min   Stress: Not at all  Relationships   Social connections    Talks on phone: More than three times a week    Gets together: More than three times a week    Attends religious service: Never    Active member of club or organization: No    Attends meetings of clubs or organizations: Never    Relationship status: Married   Intimate partner violence    Fear of current or ex partner: Not on file    Emotionally abused: Not on file    Physically abused: Not on file    Forced sexual activity: Not on file  Other Topics  Concern   Not on file  Social History Narrative   Not on file    Family History  Problem Relation Age of Onset   Brain cancer Mother    Emphysema Father    Cancer Brother      Current Outpatient Medications:    acetaminophen (TYLENOL) 500 MG tablet, Take 2 tablets (1,000 mg total) by mouth every 6 (six) hours. (Patient taking differently: Take 1,000 mg by mouth every 6 (six) hours as needed for moderate pain. ), Disp: 30 tablet, Rfl: 0   aspirin EC 81 MG tablet, Take 81 mg by mouth daily., Disp: , Rfl:    carvedilol (COREG) 6.25 MG tablet, Take 6.25 mg by mouth 2 (two) times daily with a meal., Disp: , Rfl: 3   diltiazem (CARDIZEM CD) 300 MG 24 hr capsule, Take 1 capsule (300 mg total) by mouth daily., Disp: 90 capsule, Rfl: 1   dorzolamide-timolol (COSOPT) 22.3-6.8 MG/ML ophthalmic solution, INSTILL ONE DROP INTO BOTH EYES TWICE DAILY, Disp: , Rfl:    ferrous sulfate 325 (65 FE) MG tablet, TAKE 1 TABLET BY MOUTH EVERY DAY WITH BREAKFAST, Disp: 90 tablet, Rfl: 1   gabapentin (NEURONTIN) 300 MG capsule, Take 2 capsules (600 mg total) by mouth 3 (three) times daily.,  Disp: 540 capsule, Rfl: 0   hydrochlorothiazide (HYDRODIURIL) 25 MG tablet, Take 1 tablet (25 mg total) by mouth daily., Disp: 90 tablet, Rfl: 3   latanoprost (XALATAN) 0.005 % ophthalmic solution, Place 1 drop into both eyes at bedtime. , Disp: , Rfl: 3   lidocaine-prilocaine (EMLA) cream, Apply to affected area once, Disp: 30 g, Rfl: 3   Multiple Vitamin (MULTIVITAMIN WITH MINERALS) TABS tablet, Take 1 tablet by mouth daily., Disp: , Rfl:    OMEGA-3 FATTY ACIDS PO, Take 1,200 mg by mouth daily. , Disp: , Rfl:    omeprazole (PRILOSEC) 40 MG capsule, TAKE 1 CAPSULE BY MOUTH EVERY DAY, Disp: 90 capsule, Rfl: 0   ondansetron (ZOFRAN) 8 MG tablet, Take 1 tablet (8 mg total) by mouth 2 (two) times daily as needed (Nausea or vomiting)., Disp: 30 tablet, Rfl: 1   predniSONE (DELTASONE) 50 MG tablet, Take one tablet (50 mg) daily X 5 days., Disp: 5 tablet, Rfl: 0   prochlorperazine (COMPAZINE) 10 MG tablet, Take 1 tablet (10 mg total) by mouth every 6 (six) hours as needed (Nausea or vomiting)., Disp: 30 tablet, Rfl: 1   rosuvastatin (CRESTOR) 10 MG tablet, Take 1 tablet (10 mg total) by mouth daily., Disp: 90 tablet, Rfl: 0   sucralfate (CARAFATE) 1 g tablet, Take 1 tablet (1 g total) by mouth 4 (four) times daily -  with meals and at bedtime., Disp: 28 tablet, Rfl: 0   triamcinolone cream (KENALOG) 0.1 %, APPLY 1 APPLICATION TOPICALLY 2 (TWO) TIMES DAILY. TO EXTERNAL EAR, Disp: 30 g, Rfl: 0   triamcinolone ointment (KENALOG) 0.5 %, Apply 1 application topically 3 (three) times daily., Disp: 30 g, Rfl: 0   valsartan (DIOVAN) 160 MG tablet, Take 1 tablet (160 mg total) by mouth daily., Disp: 180 tablet, Rfl: 3  Physical exam: There were no vitals filed for this visit. Physical Exam Constitutional:      Appearance: Normal appearance.  Cardiovascular:     Rate and Rhythm: Normal rate.  Pulmonary:     Effort: Pulmonary effort is normal.     Breath sounds: Normal breath sounds.    Abdominal:     General: Bowel sounds are normal.  Skin:  General: Skin is dry.     Findings: Erythema and rash present.  Neurological:     Mental Status: He is alert.      CMP Latest Ref Rng & Units 06/14/2019  Glucose 70 - 99 mg/dL 214(H)  BUN 8 - 23 mg/dL 33(H)  Creatinine 0.61 - 1.24 mg/dL 1.39(H)  Sodium 135 - 145 mmol/L 137  Potassium 3.5 - 5.1 mmol/L 3.8  Chloride 98 - 111 mmol/L 106  CO2 22 - 32 mmol/L 21(L)  Calcium 8.9 - 10.3 mg/dL 9.0  Total Protein 6.5 - 8.1 g/dL 7.2  Total Bilirubin 0.3 - 1.2 mg/dL 0.5  Alkaline Phos 38 - 126 U/L 74  AST 15 - 41 U/L 87(H)  ALT 0 - 44 U/L 93(H)   CBC Latest Ref Rng & Units 06/14/2019  WBC 4.0 - 10.5 K/uL 4.4  Hemoglobin 13.0 - 17.0 g/dL 9.0(L)  Hematocrit 39.0 - 52.0 % 28.6(L)  Platelets 150 - 400 K/uL 188    No images are attached to the encounter.  Ct Abdomen Pelvis W Contrast  Result Date: 05/16/2019 CLINICAL DATA:  Heavy alcohol abuse. Abdominal pain for 10 days. Indeterminate hypoechoic liver lesions on recent sonogram. EXAM: CT ABDOMEN AND PELVIS WITH CONTRAST TECHNIQUE: Multidetector CT imaging of the abdomen and pelvis was performed using the standard protocol following bolus administration of intravenous contrast. CONTRAST:  143m OMNIPAQUE IOHEXOL 300 MG/ML  SOLN COMPARISON:  05/15/2019 abdominal sonogram. FINDINGS: Lower chest: There is a 4.4 x 2.6 cm pleural based mass at the extreme medial basilar left lung base (series 2/image 12). A few scattered tiny solid pulmonary nodules at the right lung base, largest 4 mm in the right lower lobe (series 3/image 11). Coronary atherosclerosis. Visualized lower sternotomy wires are intact. Hepatobiliary: There are several (at least 8) ill-defined hypodense liver masses scattered throughout the liver, largest 3.1 x 2.7 cm in segment 6 right liver lobe (series 2/image 25) and 1.5 x 1.1 cm in the segment 7 right liver lobe (series 2/image 20). Cholecystectomy. No biliary ductal dilatation.  Pancreas: There is a poorly marginated heterogeneous hypodense pancreatic mass at the uncinate process measuring approximately 2.9 x 2.7 cm (series 2/image 31). This mass abuts approximately 30-40% of the circumference of the proximal SMA posteriorly (series 2/image 29). The mass encases a tributary of the SMV (series 2/image 33). No involvement by the mass of the portosplenic venous confluence or main portal vein. No pancreatic duct dilation. Spleen: Normal size. No mass. Adrenals/Urinary Tract: Normal adrenals. Simple bilateral renal cysts, largest 4.5 cm in the lower right kidney. Scattered subcentimeter hypodense renal cortical lesions in both kidneys are too small to characterize. No hydronephrosis. Normal bladder. Stomach/Bowel: Normal non-distended stomach. Normal caliber small bowel with no small bowel wall thickening. Normal appendix. Normal large bowel with no diverticulosis, large bowel wall thickening or pericolonic fat stranding. Vascular/Lymphatic: Atherosclerotic nonaneurysmal abdominal aorta. Patent portal, splenic, hepatic and renal veins. A few mildly enlarged portacaval nodes, largest 1.4 cm (series 2/image 23). Mildly enlarged 1.0 cm aortocaval node (series 2/image 35). No pelvic adenopathy. Reproductive: Mildly enlarged prostate. Other: No pneumoperitoneum, ascites or focal fluid collection. Musculoskeletal: No aggressive appearing focal osseous lesions. Status post posterior spinal fixation bilaterally at L4-5. Moderate thoracolumbar spondylosis. IMPRESSION: 1. Poorly marginated heterogeneous hypodense 2.9 cm pancreatic mass at the uncinate process, worrisome for primary pancreatic adenocarcinoma. No biliary or pancreatic duct dilation. MRI abdomen without and with IV contrast is indicated for further evaluation. 2. Several ill-defined hypodense liver masses scattered throughout  the liver, largest 3.1 cm in the segment 6 right liver lobe, worrisome for liver metastases. 3. Nonspecific  portacaval and aortocaval adenopathy, worrisome for metastatic nodes. 4. 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 Additional tiny pulmonary nodules scattered at the right lung base are indeterminate and should be reassessed on follow-up chest CT in 3 months. 5. Mild prostatomegaly. 6.  Aortic Atherosclerosis (ICD10-I70.0). These results will be called to the ordering clinician or representative by the Radiologist Assistant, and communication documented in the PACS or zVision Dashboard. Electronically Signed   By: Ilona Sorrel M.D.   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 EXAM: NUCLEAR MEDICINE PET SKULL BASE TO THIGH TECHNIQUE: 11.0 mCi F-18 FDG was injected intravenously. Full-ring PET imaging was performed from the skull base to thigh after the radiotracer. CT data was obtained and used for attenuation correction and anatomic localization. Fasting blood glucose: 123 mg/dl COMPARISON:  05/16/2019 CT abdomen/pelvis. 05/10/2019 screening chest CT. FINDINGS: Mediastinal blood pool activity: SUV max 3.2 Liver activity: SUV max NA NECK: No hypermetabolic lymph nodes in the neck. Incidental CT findings: none CHEST: Bilateral hypermetabolic hilar foci with max SUV 5.2 on the left and 3.5 on the right. No enlarged or hypermetabolic axillary or mediastinal lymph nodes. No hypermetabolic pulmonary findings. A few scattered solid right pulmonary nodules, largest 4 mm in the basilar right lower lobe (series 3/image 125), below PET resolution. The pleural based solid density 4.3 x 2.3 cm structure at the extreme medial left lung base (series 3/image 129) is non hypermetabolic. Incidental CT findings: Three-vessel coronary atherosclerosis status post CABG. Atherosclerotic nonaneurysmal thoracic aorta. Dilated  main pulmonary artery (3.6 cm diameter). Intact sternotomy wires. ABDOMEN/PELVIS: Intensely hypermetabolic uncinate process pancreatic mass measuring approximately 4.2 x 2.6 cm with max SUV 14.4 (series 3/image 163). Enlarged hypermetabolic 1.4 cm portacaval node with max SUV 10.5 (series 3/image 155). Several small hypermetabolic aortocaval and left para-aortic lymph nodes, largest 1.0 cm in the aortocaval chain with max SUV 6.2 (series 3/image 171). Several (at least 5) hypermetabolic liver masses scattered throughout the liver, largest 3.5 cm in the segment 6 right liver lobe with max SUV 12.6 (series 3/image 152), 2.4 cm in the central segment 5 right liver lobe with max SUV 13.6 (series 3/image 147) and 2.3 cm in posterior right liver lobe adjacent to the IVC with max SUV 10.9 (series 3/image 147). No abnormal hypermetabolic activity within the adrenal glands or spleen. No hypermetabolic lymph nodes in the pelvis. Incidental CT findings: Cholecystectomy. Simple 4.6 cm lower right renal cyst. Simple 1.5 cm interpolar left renal cyst. Atherosclerotic nonaneurysmal abdominal aorta. SKELETON: Faint hypermetabolism in the medial right iliac bone with associated faint sclerosis with max SUV 3.3 (series 3/image 215). No additional hypermetabolic osseous foci. Incidental CT findings: none IMPRESSION: 1. Intensely hypermetabolic uncinate process pancreatic mass, compatible with primary pancreatic adenocarcinoma. 2. Hypermetabolic portacaval, aortocaval and left para-aortic nodal metastases. Bilateral hilar nodal hypermetabolism is also compatible with nodal metastatic disease. 3. Several (at least 5) hypermetabolic liver metastases scattered throughout the liver. 4. Equivocal faint hypermetabolism in the medial right iliac bone with associated faint sclerosis, osseous metastasis not excluded, although potentially degenerative. 5. Tiny scattered right pulmonary nodules, below PET resolution, recommend attention on  follow-up chest CT in 3-6 months. 6. Chronic findings include: Aortic  Atherosclerosis (ICD10-I70.0). Dilated main pulmonary artery, suggesting pulmonary arterial hypertension. Electronically Signed   By: Ilona Sorrel M.D.   On: 06/04/2019 13:56   US Biopsy (liver)  Result Date: 05/21/2019 INDICATION: Multiple liver lesions. EXAM: ULTRASOUND DIRECTED LIVER BIOPSY. MEDICATIONS: None. ANESTHESIA/SEDATION: Moderate (conscious) sedation was employed during this procedure. A total of Versed 4.5 mg and Fentanyl 100 mcg was administered intravenously. Moderate Sedation Time: 37 minutes. The patient's level of consciousness and vital signs were monitored continuously by radiology nursing throughout the procedure under my direct supervision. FLUOROSCOPY TIME:  Fluoroscopy Time: 0 COMPLICATIONS: None immediate. PROCEDURE: After discussing the risks and benefits of this procedure with the patient informed consent was obtained. Abdomen sterilely prepped and draped. Local anesthesia administered 1% lidocaine. IV conscious sedation performed as above. Ultrasound liver biopsy was performed with a 18 gauge needle. Two good samples obtained. No complications. Post procedure patient stable. Discharge instructions given. Follow-up with patient's physician. IMPRESSION: Successful ultrasound directed liver mass biopsy. Electronically Signed   By: Marcello Moores  Register   On: 05/21/2019 09:55   Ct Biopsy  Result Date: 05/24/2019 INDICATION: Probable liver metastasis. EXAM: CT DIRECTED LIVER MASS BIOPSY MEDICATIONS: None. ANESTHESIA/SEDATION: Moderate (conscious) sedation was employed during this procedure. A total of Versed 3.0 mg and Fentanyl 50.0 mcg was administered intravenously. Moderate Sedation Time: 15 minutes. The patient's level of consciousness and vital signs were monitored continuously by radiology nursing throughout the procedure under my direct supervision. FLUOROSCOPY TIME:  Fluoroscopy Time: 9 COMPLICATIONS: None  immediate. PROCEDURE: After discussing the risks and benefits of this procedure with the patient informed consent was obtained local anesthesia administered 1% lidocaine. Under CT guidance CT-directed liver mass biopsy was performed without difficulty. Two 13 mm 18 gauge core samples obtained and sent to pathology. Post biopsy CT revealed no complications. Patient was transferred to specials recovery. Patient was evaluated in specials recovery and noted to be stable. Discharge instructions were given to him and his wife. Follow-up is with oncology. IMPRESSION: Successful CT directed liver mass biopsy. Electronically Signed   By: Marcello Moores  Register   On: 05/24/2019 12:22   Assessment and plan- Patient is a 74 y.o. male who presents to [facility] for initial meeting in preparation for starting chemotherapy for the treatment of metastatic pancreatic cancer.   Metastatic pancreatic cancer: Patient was referred by his primary care doctor for evaluation of abnormal CT scan.  He initially presented for complaints of abdominal pain radiating to his back for about 10 days.  Used Tylenol as needed for pain.  Work-up showed elevated amylase, lipase, mild anemia and hemoglobin of 11.3.  CT abdomen on 05/16/2019 revealed poorly marginated heterogeneous hypodense 2.9 cm pancreatic mass worrisome for cancer.  No biliary or pancreatic duct dilatation.  Several ill-defined hypodense liver masses scattered throughout the liver largest measuring 3.1 cm.  Nonspecific portacaval and aortocaval adenopathy.  Left lung base 4.4 cm pleural-based mass probably a pleural metastasis.  He was referred to hematology oncology for further evaluation.  Reported abdominal pain 10 out of 10.  Approximately a 20 pound weight loss.  Met with Dr. Tasia Catchings and it was decided to begin gemcitabine and Abraxane on day 1, 8 and 15 every 28 days.  Chemo Care Clinic/High Risk for ER/Hospitalization during chemotherapy- We discussed the role of the chemo  care clinic and identified patient specific risk factors. I discussed that patient was identified as high risk primarily based on patient's age and stage of cancer.  He currently has a primary  care doctor and sees her regularly.  He currently has health insurance and is covered by [employer].  He lives at home with his wife.  He does have a few biological children but does not see them.    Past Medical History:  Diagnosis Date   Allergic rhinitis    Cancer [facility])    new dx Pancreatic Cancer - Aug 2020   CHF (congestive heart failure) (HCC)    Chronic cholecystitis    Coronary artery disease    DDD (degenerative disc disease), cervical    Dyspnea    Early cataract    Erectile dysfunction    GERD (gastroesophageal reflux disease)    Gouty arthritis    Headache    occasional migraines   Hypercalcemia    Hyperlipemia    Hypertension    Palpitations    Peripheral vascular disease (HCC)    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   LIMA to LAD SVG to DISTAL CIRCUMFLEX SVG SEQUENTIALLY to OM1 and OM2 SVG to ACUTE MARGINAL   Spinal stenosis of cervical region    Vitamin D deficiency     Social Determinants of Health- we discussed that social determinants of health may have significant impacts on health and outcomes for cancer patients.  Today we discussed specific social determinants of performance status, alcohol use, depression, financial needs, food insecurity, housing, interpersonal violence, social connections, stress, tobacco use, and transportation.    At this time he denies any areas of need.  He has reliable transportation and currently is driving himself to and from clinic.  His performance status is 0.  He denies any food or financial insecurity.  He lives at home with his wife.  He is currently retired.  He denies any depression or violence in the home.  He currently does not drink alcohol.  He is a former smoker.   Today, patient states he feels "okay".  He received cycle 1 day 8  gemcitabine and Abraxane yesterday.  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  States the neuropathy has been a chronic problem but was significantly worse last night.  He is not currently taking any medication for this.  He also admits to a rash on bilateral lower extremities, face and abdomen.  Unclear of when rash began but is bothersome and causing insomnia.  Spoke with Dr. Tasia Catchings who recommends steroids x5 days.  Rx prednisone 50 mg daily x5 days.  I will also prescribe him some steroid cream for spot treatment.  I recommend he start taking Benadryl/Claritin or Zyrtec daily to help with the itching.  5. Palliative Care-he has been referred to palliative care for stage IV disease.  He met with Billey Chang, palliative NP on 06/07/2019 to discuss goals of care.  We also discussed the role of the Symptom Management Clinic at [facility] for acute issues and methods of contacting clinic/provider. He denies needing specific assistance at this time and He will be followed by Mariea Clonts, RN (Nurse Navigator).    Visit Diagnosis 1. Pancreatic cancer metastasized to liver [facility])     Patient expressed understanding and was in agreement with this plan. He also understands that He can call clinic at any time with any questions, concerns, or complaints.   A total of (25) minutes of face-to-face time was spent with this patient with greater than 50% of that time in counseling and care-coordination.  Faythe Casa, AGNP-C [facility] [phone] (work cell) [phone] (office)  CC:  Dr. Tasia Catchings

## 2019-06-15 NOTE — Patient Instructions (Signed)
I am so sorry that you are having problems with rash and neuropathy.  I have sent a prescription in for a cream and steroids for you to start taking today for 5 days.  Dr. Tasia Catchings will evaluate you next week.   I also recommend you start to take either Zyrtec or Claritin which is an antihistamine to help with the itching.  You can also take a Benadryl if itching continues.  Apply the cream to both your legs and face.  Start taking your steroid today.  He will take that for a total of 5 days.  Both prescriptions were called into your pharmacy.  Faythe Casa, NP 06/15/2019 11:01 AM  Please call with questions [phone].

## 2019-06-15 NOTE — Progress Notes (Signed)
Hematology/Oncology  Follow up note [facility] [phone] [phone]   Patient Care Team: Paulene Floor as PCP - General (Physician Assistant) Clent Jacks, RN as Oncology Nurse Navigator  REFERRING PROVIDER: Trinna Post, PA-C  CHIEF COMPLAINTS/REASON FOR VISIT:  Evaluation of abnormal CT  HISTORY OF PRESENTING ILLNESS:   Craig [name] is a  73 y.o.  male with PMH listed below, including CABG x5, PVD, hypertension, former tobacco abuse, former alcohol abuse, iron deficiency anemia, was seen in consultation at the request of  Terrilee Croak, Adriana M, PA-C  for evaluation of abnormal CT Patient recently presented to primary care provider complaining for abdominal pain radiating to his back for about 10 days.  Patient uses Tylenol as needed for pain. Work-up showed elevated amylase, lipase, mild anemia with hemoglobin of 11.3 Acute hepatitis panel negative. 05/16/2019 CT abdomen pelvis with contrast showed poorly marginated heterogeneous hypodense 2.9 cm pancreatic mass at the uncinate process, worrisome for primary pancreatic cancer.  No biliary or pancreatic duct dilation. Several ill defined hypodense liver masses scattered throughout the liver, largest 3.1 cm in the segment 6 right liver lobe. Nonspecific portacaval and aortocaval adenopathy. Extreme medial left lung base 4.4 cm pleural-based mass probably a pleural metastasis.  Additional tiny pulmonary nodules scattered at the right lung base are indeterminate. Mild prostate or megaly  Patient was referred to heme-onc for further evaluation. Patient reports that his abdominal pain was 10 out of 10 a few days ago, he uses Tylenol as needed. Today his pain is getting better, 2 out of 10.  Denies any nausea, vomiting, diarrhea, fever or chills. Married, lives with wife.  Appetite is good.  He weighs 195 pounds today at the clinic. His weight was 204 pounds back in May  2020.  He had a history of 37.5-pack-year smoking history quitting 2018. No longer drinking alcohol.  # ultrasound-guided liver biopsy on 05/21/2019.  Pathology showed fragments of benign hepatic parenchyma showing minimal macro vascular steatosis, otherwise no significant histo pathologic change. Single core of renal tissue showing approximately 25% glomerulosclerosis  # #History of CAD, status post CABG x5.  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  INTERVAL HISTORY Craig [name] is a 73 y.o. male who has above history reviewed by me today presents for follow up for evaluation prior to first cycle of chemotherapy. Problems and complaints are listed below: Patient reports feeling tired.  Otherwise no new complaints. Denies any nausea, vomiting, fever, chills. Denies any pain today. Appetite is fair.  Review of Systems  Constitutional: Positive for fatigue and unexpected weight change. Negative for appetite change, chills and fever.  HENT:   Negative for hearing loss and voice change.   Eyes: Negative for eye problems and icterus.  Respiratory: Negative for chest tightness, cough and shortness of breath.   Cardiovascular: Negative for chest pain and leg swelling.  Gastrointestinal: Negative for abdominal distention and abdominal pain.  Endocrine: Negative for hot flashes.  Genitourinary: Negative for difficulty urinating, dysuria and frequency.   Musculoskeletal: Negative for arthralgias.  Skin: Negative for itching and rash.  Neurological: Negative for light-headedness and numbness.  Hematological: Negative for adenopathy. Does not bruise/bleed easily.  Psychiatric/Behavioral: Negative for confusion.    MEDICAL HISTORY:  Past Medical History:  Diagnosis Date   Allergic rhinitis    Cancer [facility])    new dx Pancreatic Cancer - Aug  2020   CHF  (congestive heart failure) (HCC)    Chronic cholecystitis    Coronary artery disease    DDD (degenerative disc disease), cervical    Dyspnea    Early cataract    Erectile dysfunction    GERD (gastroesophageal reflux disease)    Gouty arthritis    Headache    occasional migraines   Hypercalcemia    Hyperlipemia    Hypertension    Palpitations    Peripheral vascular disease (HCC)    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   LIMA to LAD SVG to DISTAL CIRCUMFLEX SVG SEQUENTIALLY to OM1 and OM2 SVG to ACUTE MARGINAL   Spinal stenosis of cervical region    Vitamin D deficiency     SURGICAL HISTORY: Past Surgical History:  Procedure Laterality Date   BACK SURGERY  2018    fusion with screws   CHOLECYSTECTOMY N/A 11/13/2018   Procedure: LAPAROSCOPIC CHOLECYSTECTOMY;  Surgeon: Vickie Epley, MD;  Location: [facility] ORS;  Service: General;  Laterality: N/A;   COLONOSCOPY     CORONARY ARTERY BYPASS GRAFT N/A 09/30/2017   Procedure: CORONARY ARTERY BYPASS GRAFTING times five using right and left Saphaneous vein harvested endoscopicly  and left internal mammary artery. (CABG),TEE;  Surgeon: Rexene Alberts, MD;  Location: [place];  Service: Open Heart Surgery;  Laterality: N/A;   LEFT HEART CATH AND CORONARY ANGIOGRAPHY Left 09/06/2017   Procedure: LEFT HEART CATH AND CORONARY ANGIOGRAPHY;  Surgeon: Corey Skains, MD;  Location: [place] [facility];  Service: Cardiovascular;  Laterality: Left;   percutaneous transluminal balloon angioplasty  01/2010   of left lower extremity   PORTA CATH INSERTION N/A 06/06/2019   Procedure: PORTA CATH INSERTION;  Surgeon: Katha Cabal, MD;  Location: [place] [facility];  Service: Cardiovascular;  Laterality: N/A;   TEE WITHOUT CARDIOVERSION N/A 09/30/2017   Procedure: TRANSESOPHAGEAL ECHOCARDIOGRAM (TEE);  Surgeon: Rexene Alberts, MD;  Location: [place];  Service: Open Heart Surgery;  Laterality: N/A;   VASCULAR SURGERY Left 2010    left exernal iliac and superficial femoral artery PTCA and stenting    SOCIAL HISTORY: Social History   Socioeconomic History   Marital status: Married    Spouse name: Enid Derry   Number of children: 2   Years of education: Not on file   Highest education level: Not on file  Occupational History   Occupation: drove tractors    Comment: retired  Scientist, product/process development strain: Not hard at [employer] insecurity    Worry: Never true    Inability: Never true   Transportation needs    Medical: No    Non-medical: No  Tobacco Use   Smoking status: Former Smoker    Packs/day: 0.75    Years: 50.00    Pack years: 37.50    Types: Cigarettes    Quit date: 09/27/2017    Years since quitting: 1.7   Smokeless tobacco: Former Systems developer    Types: Chew  Substance and Sexual Activity   Alcohol use: Not Currently    Comment: stopped drinking before cabg   Drug use: Not Currently    Types: Marijuana    Comment: stopped smoking before CABG   Sexual activity: Not Currently  Lifestyle   Physical activity    Days per week: 5 days    Minutes per session: 150+ min   Stress: Not at all  Relationships   Social connections    Talks on phone:  More than three times a week    Gets together: More than three times a week    Attends religious service: Never    Active member of club or organization: No    Attends meetings of clubs or organizations: Never    Relationship status: Married   Intimate partner violence    Fear of current or ex partner: Not on file    Emotionally abused: Not on file    Physically abused: Not on file    Forced sexual activity: Not on file  Other Topics Concern   Not on file  Social History Narrative   Not on file    FAMILY HISTORY: Family History  Problem Relation Age of Onset   Brain cancer Mother    Emphysema Father    Cancer Brother     ALLERGIES:  is allergic to lipitor [atorvastatin]; zetia [ezetimibe]; lisinopril; protonix  [pantoprazole]; and spironolactone.  MEDICATIONS:  Current Outpatient Medications  Medication Sig Dispense Refill   acetaminophen (TYLENOL) 500 MG tablet Take 2 tablets (1,000 mg total) by mouth every 6 (six) hours. (Patient taking differently: Take 1,000 mg by mouth every 6 (six) hours as needed for moderate pain. ) 30 tablet 0   aspirin EC 81 MG tablet Take 81 mg by mouth daily.     carvedilol (COREG) 6.25 MG tablet Take 6.25 mg by mouth 2 (two) times daily with a meal.  3   diltiazem (CARDIZEM CD) 300 MG 24 hr capsule Take 1 capsule (300 mg total) by mouth daily. 90 capsule 1   dorzolamide-timolol (COSOPT) 22.3-6.8 MG/ML ophthalmic solution INSTILL ONE DROP INTO BOTH EYES TWICE DAILY     ferrous sulfate 325 (65 FE) MG tablet TAKE 1 TABLET BY MOUTH EVERY DAY WITH BREAKFAST 90 tablet 1   gabapentin (NEURONTIN) 300 MG capsule Take 2 capsules (600 mg total) by mouth 3 (three) times daily. 540 capsule 0   hydrochlorothiazide (HYDRODIURIL) 25 MG tablet Take 1 tablet (25 mg total) by mouth daily. 90 tablet 3   latanoprost (XALATAN) 0.005 % ophthalmic solution Place 1 drop into both eyes at bedtime.   3   lidocaine-prilocaine (EMLA) cream Apply to affected area once 30 g 3   Multiple Vitamin (MULTIVITAMIN WITH MINERALS) TABS tablet Take 1 tablet by mouth daily.     OMEGA-3 FATTY ACIDS PO Take 1,200 mg by mouth daily.      omeprazole (PRILOSEC) 40 MG capsule TAKE 1 CAPSULE BY MOUTH EVERY DAY 90 capsule 0   ondansetron (ZOFRAN) 8 MG tablet Take 1 tablet (8 mg total) by mouth 2 (two) times daily as needed (Nausea or vomiting). 30 tablet 1   prochlorperazine (COMPAZINE) 10 MG tablet Take 1 tablet (10 mg total) by mouth every 6 (six) hours as needed (Nausea or vomiting). 30 tablet 1   rosuvastatin (CRESTOR) 10 MG tablet Take 1 tablet (10 mg total) by mouth daily. 90 tablet 0   sucralfate (CARAFATE) 1 g tablet Take 1 tablet (1 g total) by mouth 4 (four) times daily -  with meals and at  bedtime. 28 tablet 0   triamcinolone cream (KENALOG) 0.1 % APPLY 1 APPLICATION TOPICALLY 2 (TWO) TIMES DAILY. TO EXTERNAL EAR 30 g 0   valsartan (DIOVAN) 160 MG tablet Take 1 tablet (160 mg total) by mouth daily. 180 tablet 3   No current facility-administered medications for this visit.      PHYSICAL EXAMINATION: ECOG PERFORMANCE STATUS: 2 - Symptomatic, <50% confined to bed Vitals:   06/14/19  0900  BP: (!) 117/47  Pulse: 79  Temp: 97.8 F (36.6 C)   Filed Weights   06/14/19 0900  Weight: 190 lb (86.2 kg)    Physical Exam Constitutional:      General: He is not in acute distress.    Comments: Walk in   HENT:     Head: Normocephalic and atraumatic.  Eyes:     General: No scleral icterus.    Pupils: Pupils are equal, round, and reactive to light.  Neck:     Musculoskeletal: Normal range of motion and neck supple.  Cardiovascular:     Rate and Rhythm: Normal rate and regular rhythm.     Heart sounds: Normal heart sounds.  Pulmonary:     Effort: Pulmonary effort is normal. No respiratory distress.     Breath sounds: No wheezing.  Abdominal:     General: Bowel sounds are normal. There is no distension.     Palpations: Abdomen is soft. There is no mass.     Tenderness: There is no abdominal tenderness.  Musculoskeletal: Normal range of motion.        General: No deformity.  Skin:    General: Skin is warm and dry.     Findings: No erythema or rash.  Neurological:     General: No focal deficit present.     Mental Status: He is alert and oriented to person, place, and time.     Cranial Nerves: No cranial nerve deficit.     Coordination: Coordination normal.  Psychiatric:        Behavior: Behavior normal.        Thought Content: Thought content normal.     LABORATORY DATA:  I have reviewed the data as listed Lab Results  Component Value Date   WBC 4.4 06/14/2019   HGB 9.0 (L) 06/14/2019   HCT 28.6 (L) 06/14/2019   MCV 86.1 06/14/2019   PLT 188 06/14/2019    Recent Labs    05/17/19 1212 06/07/19 0848 06/14/19 0835  NA 140 137 137  K 4.4 3.7 3.8  CL 106 106 106  CO2 24 22 21*  GLUCOSE 131* 137* 214*  BUN 23 20 33*  CREATININE 1.36* 1.15 1.39*  CALCIUM 9.4 9.4 9.0  GFRNONAA 52* >60 50*  GFRAA 60* >60 58*  PROT 8.2* 7.7 7.2  ALBUMIN 4.2 4.1 3.3*  AST 31 26 87*  ALT 30 27 93*  ALKPHOS 67 76 74  BILITOT 0.5 0.5 0.5   Iron/TIBC/Ferritin/ %Sat    Component Value Date/Time   IRON 119 04/27/2019 0940   TIBC 363 04/27/2019 0940   FERRITIN 58 04/27/2019 0940   IRONPCTSAT 33 04/27/2019 0940      RADIOGRAPHIC STUDIES: I have personally reviewed the radiological images as listed and agreed with the findings in the report.  Ct Abdomen Pelvis W Contrast  Result Date: 05/16/2019 CLINICAL DATA:  Heavy alcohol abuse. Abdominal pain for 10 days. Indeterminate hypoechoic liver lesions on recent sonogram. EXAM: CT ABDOMEN AND PELVIS WITH CONTRAST TECHNIQUE: Multidetector CT imaging of the abdomen and pelvis was performed using the standard protocol following bolus administration of intravenous contrast. CONTRAST:  176mL OMNIPAQUE IOHEXOL 300 MG/ML  SOLN COMPARISON:  05/15/2019 abdominal sonogram. FINDINGS: Lower chest: There is a 4.4 x 2.6 cm pleural based mass at the extreme medial basilar left lung base (series 2/image 12). A few scattered tiny solid pulmonary nodules at the right lung base, largest 4 mm in the right lower lobe (series 3/image  11). Coronary atherosclerosis. Visualized lower sternotomy wires are intact. Hepatobiliary: There are several (at least 8) ill-defined hypodense liver masses scattered throughout the liver, largest 3.1 x 2.7 cm in segment 6 right liver lobe (series 2/image 25) and 1.5 x 1.1 cm in the segment 7 right liver lobe (series 2/image 20). Cholecystectomy. No biliary ductal dilatation. Pancreas: There is a poorly marginated heterogeneous hypodense pancreatic mass at the uncinate process measuring approximately 2.9 x 2.7  cm (series 2/image 31). This mass abuts approximately 30-40% of the circumference of the proximal SMA posteriorly (series 2/image 29). The mass encases a tributary of the SMV (series 2/image 33). No involvement by the mass of the portosplenic venous confluence or main portal vein. No pancreatic duct dilation. Spleen: Normal size. No mass. Adrenals/Urinary Tract: Normal adrenals. Simple bilateral renal cysts, largest 4.5 cm in the lower right kidney. Scattered subcentimeter hypodense renal cortical lesions in both kidneys are too small to characterize. No hydronephrosis. Normal bladder. Stomach/Bowel: Normal non-distended stomach. Normal caliber small bowel with no small bowel wall thickening. Normal appendix. Normal large bowel with no diverticulosis, large bowel wall thickening or pericolonic fat stranding. Vascular/Lymphatic: Atherosclerotic nonaneurysmal abdominal aorta. Patent portal, splenic, hepatic and renal veins. A few mildly enlarged portacaval nodes, largest 1.4 cm (series 2/image 23). Mildly enlarged 1.0 cm aortocaval node (series 2/image 35). No pelvic adenopathy. Reproductive: Mildly enlarged prostate. Other: No pneumoperitoneum, ascites or focal fluid collection. Musculoskeletal: No aggressive appearing focal osseous lesions. Status post posterior spinal fixation bilaterally at L4-5. Moderate thoracolumbar spondylosis. IMPRESSION: 1. Poorly marginated heterogeneous hypodense 2.9 cm pancreatic mass at the uncinate process, worrisome for primary pancreatic adenocarcinoma. No biliary or pancreatic duct dilation. MRI abdomen without and with IV contrast is indicated for further evaluation. 2. Several ill-defined hypodense liver masses scattered throughout the liver, largest 3.1 cm in the segment 6 right liver lobe, worrisome for liver metastases. 3. Nonspecific portacaval and aortocaval adenopathy, worrisome for metastatic nodes. 4. Extreme medial left lung base 4.4 cm pleural based mass, probably a  pleural metastasis. Additional tiny pulmonary nodules scattered at the right lung base are indeterminate and should be reassessed on follow-up chest CT in 3 months. 5. Mild prostatomegaly. 6.  Aortic Atherosclerosis (ICD10-I70.0). These results will be called to the ordering clinician or representative by the Radiologist Assistant, and communication documented in the PACS or zVision Dashboard. Electronically Signed   By: Ilona Sorrel M.D.   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 EXAM: NUCLEAR MEDICINE PET SKULL BASE TO THIGH TECHNIQUE: 11.0 mCi F-18 FDG was injected intravenously. Full-ring PET imaging was performed from the skull base to thigh after the radiotracer. CT data was obtained and used for attenuation correction and anatomic localization. Fasting blood glucose: 123 mg/dl COMPARISON:  05/16/2019 CT abdomen/pelvis. 05/10/2019 screening chest CT. FINDINGS: Mediastinal blood pool activity: SUV max 3.2 Liver activity: SUV max NA NECK: No hypermetabolic lymph nodes in the neck. Incidental CT findings: none CHEST: Bilateral hypermetabolic hilar foci with max SUV 5.2 on the left and 3.5 on the right. No enlarged or hypermetabolic axillary or mediastinal lymph nodes. No hypermetabolic pulmonary findings. A few scattered solid right pulmonary nodules, largest 4 mm in the basilar right lower lobe (series 3/image 125), below PET resolution. The pleural based solid density 4.3 x 2.3 cm structure at the extreme medial left lung base (series  3/image 129) is non hypermetabolic. Incidental CT findings: Three-vessel coronary atherosclerosis status post CABG. Atherosclerotic nonaneurysmal thoracic aorta. Dilated main pulmonary artery (3.6 cm diameter). Intact sternotomy wires. ABDOMEN/PELVIS: Intensely hypermetabolic uncinate process pancreatic mass  measuring approximately 4.2 x 2.6 cm with max SUV 14.4 (series 3/image 163). Enlarged hypermetabolic 1.4 cm portacaval node with max SUV 10.5 (series 3/image 155). Several small hypermetabolic aortocaval and left para-aortic lymph nodes, largest 1.0 cm in the aortocaval chain with max SUV 6.2 (series 3/image 171). Several (at least 5) hypermetabolic liver masses scattered throughout the liver, largest 3.5 cm in the segment 6 right liver lobe with max SUV 12.6 (series 3/image 152), 2.4 cm in the central segment 5 right liver lobe with max SUV 13.6 (series 3/image 147) and 2.3 cm in posterior right liver lobe adjacent to the IVC with max SUV 10.9 (series 3/image 147). No abnormal hypermetabolic activity within the adrenal glands or spleen. No hypermetabolic lymph nodes in the pelvis. Incidental CT findings: Cholecystectomy. Simple 4.6 cm lower right renal cyst. Simple 1.5 cm interpolar left renal cyst. Atherosclerotic nonaneurysmal abdominal aorta. SKELETON: Faint hypermetabolism in the medial right iliac bone with associated faint sclerosis with max SUV 3.3 (series 3/image 215). No additional hypermetabolic osseous foci. Incidental CT findings: none IMPRESSION: 1. Intensely hypermetabolic uncinate process pancreatic mass, compatible with primary pancreatic adenocarcinoma. 2. Hypermetabolic portacaval, aortocaval and left para-aortic nodal metastases. Bilateral hilar nodal hypermetabolism is also compatible with nodal metastatic disease. 3. Several (at least 5) hypermetabolic liver metastases scattered throughout the liver. 4. Equivocal faint hypermetabolism in the medial right iliac bone with associated faint sclerosis, osseous metastasis not excluded, although potentially degenerative. 5. Tiny scattered right pulmonary nodules, below PET resolution, recommend attention on follow-up chest CT in 3-6 months. 6. Chronic findings include: Aortic Atherosclerosis (ICD10-I70.0). Dilated main pulmonary artery, suggesting  pulmonary arterial hypertension. Electronically Signed   By: Ilona Sorrel M.D.   On: 06/04/2019 13:56   US Biopsy (liver)  Result Date: 05/21/2019 INDICATION: Multiple liver lesions. EXAM: ULTRASOUND DIRECTED LIVER BIOPSY. MEDICATIONS: None. ANESTHESIA/SEDATION: Moderate (conscious) sedation was employed during this procedure. A total of Versed 4.5 mg and Fentanyl 100 mcg was administered intravenously. Moderate Sedation Time: 37 minutes. The patient's level of consciousness and vital signs were monitored continuously by radiology nursing throughout the procedure under my direct supervision. FLUOROSCOPY TIME:  Fluoroscopy Time: 0 COMPLICATIONS: None immediate. PROCEDURE: After discussing the risks and benefits of this procedure with the patient informed consent was obtained. Abdomen sterilely prepped and draped. Local anesthesia administered 1% lidocaine. IV conscious sedation performed as above. Ultrasound liver biopsy was performed with a 18 gauge needle. Two good samples obtained. No complications. Post procedure patient stable. Discharge instructions given. Follow-up with patient's physician. IMPRESSION: Successful ultrasound directed liver mass biopsy. Electronically Signed   By: Marcello Moores  Register   On: 05/21/2019 09:55   Ct Biopsy  Result Date: 05/24/2019 INDICATION: Probable liver metastasis. EXAM: CT DIRECTED LIVER MASS BIOPSY MEDICATIONS: None. ANESTHESIA/SEDATION: Moderate (conscious) sedation was employed during this procedure. A total of Versed 3.0 mg and Fentanyl 50.0 mcg was administered intravenously. Moderate Sedation Time: 15 minutes. The patient's level of consciousness and vital signs were monitored continuously by radiology nursing throughout the procedure under my direct supervision. FLUOROSCOPY TIME:  Fluoroscopy Time: 9 COMPLICATIONS: None immediate. PROCEDURE: After discussing the risks and benefits of this procedure with the patient informed consent was obtained local anesthesia  administered 1% lidocaine. Under CT guidance CT-directed liver mass biopsy was performed without difficulty. Two  13 mm 18 gauge core samples obtained and sent to pathology. Post biopsy CT revealed no complications. Patient was transferred to specials recovery. Patient was evaluated in specials recovery and noted to be stable. Discharge instructions were given to him and his wife. Follow-up is with oncology. IMPRESSION: Successful CT directed liver mass biopsy. Electronically Signed   By: Marcello Moores  Register   On: 05/24/2019 12:22      ASSESSMENT & PLAN:  1. Pancreatic cancer metastasized to liver ([place])   2. Liver metastasis ([place])   3. Encounter for antineoplastic chemotherapy   4. Normocytic anemia    #Metastatic pancreatic cancer, Labs are reviewed and discussed with patient. Lab counts acceptable to proceed with cycle 1 day 8 gemcitabine and Abraxane treatment today. He tolerates day 1 treatment very well   #Normocytic anemia, hemoglobin 9,  continue to monitor.  Chemotherapy-induced  He also has a history of iron deficiency.  Continue oral iron supplementation.  #Patient understands that her condition is not curable.  Continue follow-up with palliative care service.    All questions were answered. The patient knows to call the clinic with any problems questions or concerns.  cc Trinna Post, PA-C   Return of visit: Day 15 of chemotherapy for assessment.  Earlie Server, MD, PhD Hematology Oncology [place] at [facility] 06/15/2019

## 2019-06-20 ENCOUNTER — Other Ambulatory Visit: Payer: Self-pay

## 2019-06-20 ENCOUNTER — Encounter: Payer: Self-pay | Admitting: Oncology

## 2019-06-20 NOTE — Progress Notes (Signed)
Pre visit screening completed unable to complete med rec. Patient reports that earlier this week while working outdoors he became dehydrated and experienced and episode of syncope.911 was called he was diagnosed with dehydration.

## 2019-06-21 ENCOUNTER — Inpatient Hospital Stay: Payer: Medicare Other | Admitting: Oncology

## 2019-06-21 ENCOUNTER — Inpatient Hospital Stay: Payer: Medicare Other

## 2019-06-21 ENCOUNTER — Encounter: Payer: Self-pay | Admitting: Oncology

## 2019-06-21 ENCOUNTER — Other Ambulatory Visit: Payer: Self-pay

## 2019-06-21 VITALS — BP 119/70 | HR 81 | Temp 97.3°F | Resp 18 | Wt 185.2 lb

## 2019-06-21 DIAGNOSIS — D649 Anemia, unspecified: Secondary | ICD-10-CM

## 2019-06-21 DIAGNOSIS — C259 Malignant neoplasm of pancreas, unspecified: Secondary | ICD-10-CM

## 2019-06-21 DIAGNOSIS — E860 Dehydration: Secondary | ICD-10-CM

## 2019-06-21 DIAGNOSIS — Z5111 Encounter for antineoplastic chemotherapy: Secondary | ICD-10-CM | POA: Diagnosis not present

## 2019-06-21 DIAGNOSIS — C787 Secondary malignant neoplasm of liver and intrahepatic bile duct: Secondary | ICD-10-CM | POA: Diagnosis not present

## 2019-06-21 HISTORY — DX: Dehydration: E86.0

## 2019-06-21 LAB — COMPREHENSIVE METABOLIC PANEL
ALT: 312 U/L — ABNORMAL HIGH (ref 0–44)
AST: 129 U/L — ABNORMAL HIGH (ref 15–41)
Albumin: 3.4 g/dL — ABNORMAL LOW (ref 3.5–5.0)
Alkaline Phosphatase: 69 U/L (ref 38–126)
Anion gap: 12 (ref 5–15)
BUN: 41 mg/dL — ABNORMAL HIGH (ref 8–23)
CO2: 20 mmol/L — ABNORMAL LOW (ref 22–32)
Calcium: 9.3 mg/dL (ref 8.9–10.3)
Chloride: 100 mmol/L (ref 98–111)
Creatinine, Ser: 1.33 mg/dL — ABNORMAL HIGH (ref 0.61–1.24)
GFR calc Af Amer: >60 mL/min (ref >60)
GFR calc non Af Amer: 53 mL/min — ABNORMAL LOW (ref >60)
Glucose, Bld: 179 mg/dL — ABNORMAL HIGH (ref 70–99)
Potassium: 4.2 mmol/L (ref 3.5–5.1)
Sodium: 132 mmol/L — ABNORMAL LOW (ref 135–145)
Total Bilirubin: 0.5 mg/dL (ref 0.3–1.2)
Total Protein: 7.3 g/dL (ref 6.5–8.1)

## 2019-06-21 LAB — CBC WITH DIFFERENTIAL/PLATELET
Abs Immature Granulocytes: 0.44 10*3/uL — ABNORMAL HIGH (ref 0.00–0.07)
Basophils Absolute: 0 10*3/uL (ref 0.0–0.1)
Basophils Relative: 0 %
Eosinophils Absolute: 0 10*3/uL (ref 0.0–0.5)
Eosinophils Relative: 0 %
HCT: 30.2 % — ABNORMAL LOW (ref 39.0–52.0)
Hemoglobin: 9.6 g/dL — ABNORMAL LOW (ref 13.0–17.0)
Immature Granulocytes: 6 %
Lymphocytes Relative: 14 %
Lymphs Abs: 1.1 10*3/uL (ref 0.7–4.0)
MCH: 27.5 pg (ref 26.0–34.0)
MCHC: 31.8 g/dL (ref 30.0–36.0)
MCV: 86.5 fL (ref 80.0–100.0)
Monocytes Absolute: 0.3 10*3/uL (ref 0.1–1.0)
Monocytes Relative: 4 %
Neutro Abs: 5.6 10*3/uL (ref 1.7–7.7)
Neutrophils Relative %: 76 %
Platelets: 246 10*3/uL (ref 150–400)
RBC: 3.49 MIL/uL — ABNORMAL LOW (ref 4.22–5.81)
RDW: 14.9 % (ref 11.5–15.5)
Smear Review: NORMAL
WBC: 7.5 10*3/uL (ref 4.0–10.5)
nRBC: 0 % (ref 0.0–0.2)

## 2019-06-21 MED ORDER — SODIUM CHLORIDE 0.9 % IV SOLN
Freq: Once | INTRAVENOUS | Status: AC
  Administered 2019-06-21: 10:00:00 via INTRAVENOUS
  Filled 2019-06-21: qty 250

## 2019-06-21 MED ORDER — SODIUM CHLORIDE 0.9 % IV SOLN
Freq: Once | INTRAVENOUS | Status: AC
  Administered 2019-06-21: 1000 mL via INTRAVENOUS
  Filled 2019-06-21: qty 250

## 2019-06-21 MED ORDER — SODIUM CHLORIDE 0.9 % IV SOLN
2000.0000 mg | Freq: Once | INTRAVENOUS | Status: AC
  Administered 2019-06-21: 2000 mg via INTRAVENOUS
  Filled 2019-06-21: qty 52.6

## 2019-06-21 MED ORDER — PACLITAXEL PROTEIN-BOUND CHEMO INJECTION 100 MG
125.0000 mg/m2 | Freq: Once | INTRAVENOUS | Status: AC
  Administered 2019-06-21: 250 mg via INTRAVENOUS
  Filled 2019-06-21: qty 50

## 2019-06-21 MED ORDER — SODIUM CHLORIDE 0.9% FLUSH
10.0000 mL | INTRAVENOUS | Status: DC | PRN
  Administered 2019-06-21: 10 mL via INTRAVENOUS
  Filled 2019-06-21: qty 10

## 2019-06-21 MED ORDER — PROCHLORPERAZINE MALEATE 10 MG PO TABS
10.0000 mg | ORAL_TABLET | Freq: Once | ORAL | Status: AC
  Administered 2019-06-21: 10 mg via ORAL
  Filled 2019-06-21: qty 1

## 2019-06-21 MED ORDER — HEPARIN SOD (PORK) LOCK FLUSH 100 UNIT/ML IV SOLN
500.0000 [IU] | Freq: Once | INTRAVENOUS | Status: AC
  Administered 2019-06-21: 500 [IU] via INTRAVENOUS
  Filled 2019-06-21: qty 5

## 2019-06-21 MED ORDER — SODIUM CHLORIDE 0.9 % IV SOLN: 1000.0000 mg/m2 | Freq: Once | INTRAVENOUS | Status: DC

## 2019-06-21 NOTE — Progress Notes (Signed)
Hematology/Oncology  Follow up note [facility] [phone] [phone]   Patient Care Team: Paulene Floor as PCP - General (Physician Assistant) Clent Jacks, RN as Oncology Nurse Navigator  REFERRING PROVIDER: Trinna Post, PA-C  CHIEF COMPLAINTS/REASON FOR VISIT:  Evaluation of abnormal CT  HISTORY OF PRESENTING ILLNESS:   Craig [name] is a  73 y.o.  male with PMH listed below, including CABG x5, PVD, hypertension, former tobacco abuse, former alcohol abuse, iron deficiency anemia, was seen in consultation at the request of  Terrilee Croak, Adriana M, PA-C  for evaluation of abnormal CT Patient recently presented to primary care provider complaining for abdominal pain radiating to his back for about 10 days.  Patient uses Tylenol as needed for pain. Work-up showed elevated amylase, lipase, mild anemia with hemoglobin of 11.3 Acute hepatitis panel negative. 05/16/2019 CT abdomen pelvis with contrast showed poorly marginated heterogeneous hypodense 2.9 cm pancreatic mass at the uncinate process, worrisome for primary pancreatic cancer.  No biliary or pancreatic duct dilation. Several ill defined hypodense liver masses scattered throughout the liver, largest 3.1 cm in the segment 6 right liver lobe. Nonspecific portacaval and aortocaval adenopathy. Extreme medial left lung base 4.4 cm pleural-based mass probably a pleural metastasis.  Additional tiny pulmonary nodules scattered at the right lung base are indeterminate. Mild prostate or megaly  Patient was referred to heme-onc for further evaluation. Patient reports that his abdominal pain was 10 out of 10 a few days ago, he uses Tylenol as needed. Today his pain is getting better, 2 out of 10.  Denies any nausea, vomiting, diarrhea, fever or chills. Married, lives with wife.  Appetite is good.  He weighs 195 pounds today at the clinic. His weight was 204 pounds back in May  2020.  He had a history of 37.5-pack-year smoking history quitting 2018. No longer drinking alcohol.  # ultrasound-guided liver biopsy on 05/21/2019.  Pathology showed fragments of benign hepatic parenchyma showing minimal macro vascular steatosis, otherwise no significant histo pathologic change. Single core of renal tissue showing approximately 25% glomerulosclerosis  # #History of CAD, status post CABG x5.  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  INTERVAL HISTORY Craig [name] is a 73 y.o. male who has above history reviewed by me today presents for follow up for evaluation prior to first cycle of chemotherapy. Problems and complaints are listed below: Patient reports feeling okay today. Earlier this week, he was working outdoors and he became dehydrated and experienced an episode of syncope. 911 was called patient was diagnosed with dehydration. Patient was advised to oral hydration. Denies any nausea, vomiting, fever, chills, diarrhea. Denies any pain today Appetite is fair.  Review of Systems  Constitutional: Positive for fatigue and unexpected weight change. Negative for appetite change, chills and fever.  HENT:   Negative for hearing loss and voice change.   Eyes: Negative for eye problems and icterus.  Respiratory: Negative for chest tightness, cough and shortness of breath.   Cardiovascular: Negative for chest pain and leg swelling.  Gastrointestinal: Negative for abdominal distention and abdominal pain.  Endocrine: Negative for hot flashes.  Genitourinary: Negative for difficulty urinating, dysuria and frequency.   Musculoskeletal: Negative for arthralgias.  Skin: Negative for itching and rash.  Neurological: Negative for light-headedness and numbness.  Hematological: Negative for adenopathy. Does not bruise/bleed easily.   Psychiatric/Behavioral: Negative for confusion.  MEDICAL HISTORY:  Past Medical History:  Diagnosis Date   Allergic rhinitis    Cancer [facility])    new dx Pancreatic Cancer - Aug 2020   CHF (congestive heart failure) (HCC)    Chronic cholecystitis    Coronary artery disease    DDD (degenerative disc disease), cervical    Dehydration 06/21/2019   Dyspnea    Early cataract    Erectile dysfunction    GERD (gastroesophageal reflux disease)    Gouty arthritis    Headache    occasional migraines   Hypercalcemia    Hyperlipemia    Hypertension    Palpitations    Peripheral vascular disease (HCC)    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   LIMA to LAD SVG to [place] SVG SEQUENTIALLY to OM1 and OM2 SVG to ACUTE MARGINAL   Spinal stenosis of cervical region    Vitamin D deficiency     SURGICAL HISTORY: Past Surgical History:  Procedure Laterality Date   BACK SURGERY  2018    fusion with screws   CHOLECYSTECTOMY N/A 11/13/2018   Procedure: LAPAROSCOPIC CHOLECYSTECTOMY;  Surgeon: Vickie Epley, MD;  Location: [facility];  Service: General;  Laterality: N/A;   COLONOSCOPY     CORONARY ARTERY BYPASS GRAFT N/A 09/30/2017   Procedure: CORONARY ARTERY BYPASS GRAFTING times five using right and left Saphaneous vein harvested endoscopicly  and left internal mammary artery. (CABG),TEE;  Surgeon: Rexene Alberts, MD;  Location: [place];  Service: Open Heart Surgery;  Laterality: N/A;   LEFT HEART CATH AND CORONARY ANGIOGRAPHY Left 09/06/2017   Procedure: LEFT HEART CATH AND CORONARY ANGIOGRAPHY;  Surgeon: Corey Skains, MD;  Location: [place] [facility];  Service: Cardiovascular;  Laterality: Left;   percutaneous transluminal balloon angioplasty  01/2010   of left lower extremity   PORTA CATH INSERTION N/A 06/06/2019   Procedure: PORTA CATH INSERTION;  Surgeon: Katha Cabal, MD;  Location: [place] [facility];  Service: Cardiovascular;  Laterality: N/A;    TEE WITHOUT CARDIOVERSION N/A 09/30/2017   Procedure: TRANSESOPHAGEAL ECHOCARDIOGRAM (TEE);  Surgeon: Rexene Alberts, MD;  Location: [place];  Service: Open Heart Surgery;  Laterality: N/A;   VASCULAR SURGERY Left 2010   left exernal iliac and superficial femoral artery PTCA and stenting    SOCIAL HISTORY: Social History   Socioeconomic History   Marital status: Married    Spouse name: Enid Derry   Number of children: 2   Years of education: Not on file   Highest education level: Not on file  Occupational History   Occupation: drove tractors    Comment: retired  Scientist, product/process development strain: Not hard at [employer] insecurity    Worry: Never true    Inability: Never true   Transportation needs    Medical: No    Non-medical: No  Tobacco Use   Smoking status: Former Smoker    Packs/day: 0.75    Years: 50.00    Pack years: 37.50    Types: Cigarettes    Quit date: 09/27/2017    Years since quitting: 1.7   Smokeless tobacco: Former Systems developer    Types: Chew  Substance and Sexual Activity   Alcohol use: Not Currently    Comment: stopped drinking before cabg   Drug use: Not Currently    Types: Marijuana    Comment: stopped smoking before CABG   Sexual activity: Not Currently  Lifestyle   Physical activity    Days  per week: 5 days    Minutes per session: 150+ min   Stress: Not at all  Relationships   Social connections    Talks on phone: More than three times a week    Gets together: More than three times a week    Attends religious service: Never    Active member of club or organization: No    Attends meetings of clubs or organizations: Never    Relationship status: Married   Intimate partner violence    Fear of current or ex partner: Not on file    Emotionally abused: Not on file    Physically abused: Not on file    Forced sexual activity: Not on file  Other Topics Concern   Not on file  Social History Narrative   Not on file     FAMILY HISTORY: Family History  Problem Relation Age of Onset   Brain cancer Mother    Emphysema Father    Cancer Brother     ALLERGIES:  is allergic to lipitor [atorvastatin]; zetia [ezetimibe]; lisinopril; protonix [pantoprazole]; and spironolactone.  MEDICATIONS:  Current Outpatient Medications  Medication Sig Dispense Refill   acetaminophen (TYLENOL) 500 MG tablet Take 2 tablets (1,000 mg total) by mouth every 6 (six) hours. (Patient taking differently: Take 1,000 mg by mouth every 6 (six) hours as needed for moderate pain. ) 30 tablet 0   aspirin EC 81 MG tablet Take 81 mg by mouth daily.     carvedilol (COREG) 6.25 MG tablet Take 6.25 mg by mouth 2 (two) times daily with a meal.  3   diltiazem (CARDIZEM CD) 300 MG 24 hr capsule Take 1 capsule (300 mg total) by mouth daily. 90 capsule 1   dorzolamide-timolol (COSOPT) 22.3-6.8 MG/ML ophthalmic solution INSTILL ONE DROP INTO BOTH EYES TWICE DAILY     ferrous sulfate 325 (65 FE) MG tablet TAKE 1 TABLET BY MOUTH EVERY DAY WITH BREAKFAST 90 tablet 1   gabapentin (NEURONTIN) 300 MG capsule Take 2 capsules (600 mg total) by mouth 3 (three) times daily. 540 capsule 0   hydrochlorothiazide (HYDRODIURIL) 25 MG tablet Take 1 tablet (25 mg total) by mouth daily. 90 tablet 3   latanoprost (XALATAN) 0.005 % ophthalmic solution Place 1 drop into both eyes at bedtime.   3   lidocaine-prilocaine (EMLA) cream Apply to affected area once 30 g 3   Multiple Vitamin (MULTIVITAMIN WITH MINERALS) TABS tablet Take 1 tablet by mouth daily.     OMEGA-3 FATTY ACIDS PO Take 1,200 mg by mouth daily.      omeprazole (PRILOSEC) 40 MG capsule TAKE 1 CAPSULE BY MOUTH EVERY DAY 90 capsule 0   ondansetron (ZOFRAN) 8 MG tablet Take 1 tablet (8 mg total) by mouth 2 (two) times daily as needed (Nausea or vomiting). 30 tablet 1   prochlorperazine (COMPAZINE) 10 MG tablet Take 1 tablet (10 mg total) by mouth every 6 (six) hours as needed (Nausea or  vomiting). 30 tablet 1   rosuvastatin (CRESTOR) 10 MG tablet Take 1 tablet (10 mg total) by mouth daily. 90 tablet 0   sucralfate (CARAFATE) 1 g tablet Take 1 tablet (1 g total) by mouth 4 (four) times daily -  with meals and at bedtime. 28 tablet 0   triamcinolone cream (KENALOG) 0.1 % APPLY 1 APPLICATION TOPICALLY 2 (TWO) TIMES DAILY. TO EXTERNAL EAR 30 g 0   triamcinolone ointment (KENALOG) 0.5 % Apply 1 application topically 3 (three) times daily. 30 g 0  valsartan (DIOVAN) 160 MG tablet Take 1 tablet (160 mg total) by mouth daily. 180 tablet 3   predniSONE (DELTASONE) 50 MG tablet Take one tablet (50 mg) daily X 5 days. (Patient not taking: Reported on 06/21/2019) 5 tablet 0   No current facility-administered medications for this visit.    Facility-Administered Medications Ordered in Other Visits  Medication Dose Route Frequency Provider Last Rate Last Dose   sodium chloride flush (NS) 0.9 % injection 10 mL  10 mL Intravenous PRN Earlie Server, MD   10 mL at 06/21/19 0818     PHYSICAL EXAMINATION: ECOG PERFORMANCE STATUS: 1 - Symptomatic but completely ambulatory Vitals:   06/21/19 0843  BP: 119/70  Pulse: 81  Resp: 18  Temp: (!) 97.3 F (36.3 C)   Filed Weights   06/21/19 0843  Weight: 185 lb 3.2 oz (84 kg)    Physical Exam Constitutional:      General: He is not in acute distress.    Comments: Walk in   HENT:     Head: Normocephalic and atraumatic.  Eyes:     General: No scleral icterus.    Pupils: Pupils are equal, round, and reactive to light.  Neck:     Musculoskeletal: Normal range of motion and neck supple.  Cardiovascular:     Rate and Rhythm: Normal rate and regular rhythm.     Heart sounds: Normal heart sounds.  Pulmonary:     Effort: Pulmonary effort is normal. No respiratory distress.     Breath sounds: No wheezing.  Abdominal:     General: Bowel sounds are normal. There is no distension.     Palpations: Abdomen is soft. There is no mass.      Tenderness: There is no abdominal tenderness.  Musculoskeletal: Normal range of motion.        General: No deformity.  Skin:    General: Skin is warm and dry.     Findings: No erythema or rash.  Neurological:     General: No focal deficit present.     Mental Status: He is alert and oriented to person, place, and time.     Cranial Nerves: No cranial nerve deficit.     Coordination: Coordination normal.  Psychiatric:        Behavior: Behavior normal.        Thought Content: Thought content normal.     LABORATORY DATA:  I have reviewed the data as listed Lab Results  Component Value Date   WBC 7.5 06/21/2019   HGB 9.6 (L) 06/21/2019   HCT 30.2 (L) 06/21/2019   MCV 86.5 06/21/2019   PLT 246 06/21/2019   Recent Labs    06/07/19 0848 06/14/19 0835 06/21/19 0805  NA 137 137 132*  K 3.7 3.8 4.2  CL 106 106 100  CO2 22 21* 20*  GLUCOSE 137* 214* 179*  BUN 20 33* 41*  CREATININE 1.15 1.39* 1.33*  CALCIUM 9.4 9.0 9.3  GFRNONAA >60 50* 53*  GFRAA >60 58* >60  PROT 7.7 7.2 7.3  ALBUMIN 4.1 3.3* 3.4*  AST 26 87* 129*  ALT 27 93* 312*  ALKPHOS 76 74 69  BILITOT 0.5 0.5 0.5   Iron/TIBC/Ferritin/ %Sat    Component Value Date/Time   IRON 119 04/27/2019 0940   TIBC 363 04/27/2019 0940   FERRITIN 58 04/27/2019 0940   IRONPCTSAT 33 04/27/2019 0940      RADIOGRAPHIC STUDIES: I have personally reviewed the radiological images as listed and agreed with the  findings in the report.  Nm Pet Image Initial (pi) Skull Base To Thigh  Result Date: 06/04/2019 CLINICAL DATA:  Initial treatment strategy for stage IV pancreatic cancer with biopsy confirmed liver metastasis. EXAM: NUCLEAR MEDICINE PET SKULL BASE TO THIGH TECHNIQUE: 11.0 mCi F-18 FDG was injected intravenously. Full-ring PET imaging was performed from the skull base to thigh after the radiotracer. CT data was obtained and used for attenuation correction and anatomic localization. Fasting blood glucose: 123 mg/dl COMPARISON:   05/16/2019 CT abdomen/pelvis. 05/10/2019 screening chest CT. FINDINGS: Mediastinal blood pool activity: SUV max 3.2 Liver activity: SUV max NA NECK: No hypermetabolic lymph nodes in the neck. Incidental CT findings: none CHEST: Bilateral hypermetabolic hilar foci with max SUV 5.2 on the left and 3.5 on the right. No enlarged or hypermetabolic axillary or mediastinal lymph nodes. No hypermetabolic pulmonary findings. A few scattered solid right pulmonary nodules, largest 4 mm in the basilar right lower lobe (series 3/image 125), below PET resolution. The pleural based solid density 4.3 x 2.3 cm structure at the extreme medial left lung base (series 3/image 129) is non hypermetabolic. Incidental CT findings: Three-vessel coronary atherosclerosis status post CABG. Atherosclerotic nonaneurysmal thoracic aorta. Dilated main pulmonary artery (3.6 cm diameter). Intact sternotomy wires. ABDOMEN/PELVIS: Intensely hypermetabolic uncinate process pancreatic mass measuring approximately 4.2 x 2.6 cm with max SUV 14.4 (series 3/image 163). Enlarged hypermetabolic 1.4 cm portacaval node with max SUV 10.5 (series 3/image 155). Several small hypermetabolic aortocaval and left para-aortic lymph nodes, largest 1.0 cm in the aortocaval chain with max SUV 6.2 (series 3/image 171). Several (at least 5) hypermetabolic liver masses scattered throughout the liver, largest 3.5 cm in the segment 6 right liver lobe with max SUV 12.6 (series 3/image 152), 2.4 cm in the central segment 5 right liver lobe with max SUV 13.6 (series 3/image 147) and 2.3 cm in posterior right liver lobe adjacent to the IVC with max SUV 10.9 (series 3/image 147). No abnormal hypermetabolic activity within the adrenal glands or spleen. No hypermetabolic lymph nodes in the pelvis. Incidental CT findings: Cholecystectomy. Simple 4.6 cm lower right renal cyst. Simple 1.5 cm interpolar left renal cyst. Atherosclerotic nonaneurysmal abdominal aorta. SKELETON: Faint  hypermetabolism in the medial right iliac bone with associated faint sclerosis with max SUV 3.3 (series 3/image 215). No additional hypermetabolic osseous foci. Incidental CT findings: none IMPRESSION: 1. Intensely hypermetabolic uncinate process pancreatic mass, compatible with primary pancreatic adenocarcinoma. 2. Hypermetabolic portacaval, aortocaval and left para-aortic nodal metastases. Bilateral hilar nodal hypermetabolism is also compatible with nodal metastatic disease. 3. Several (at least 5) hypermetabolic liver metastases scattered throughout the liver. 4. Equivocal faint hypermetabolism in the medial right iliac bone with associated faint sclerosis, osseous metastasis not excluded, although potentially degenerative. 5. Tiny scattered right pulmonary nodules, below PET resolution, recommend attention on follow-up chest CT in 3-6 months. 6. Chronic findings include: Aortic Atherosclerosis (ICD10-I70.0). Dilated main pulmonary artery, suggesting pulmonary arterial hypertension. Electronically Signed   By: Ilona Sorrel M.D.   On: 06/04/2019 13:56   Ct Biopsy  Result Date: 05/24/2019 INDICATION: Probable liver metastasis. EXAM: CT DIRECTED LIVER MASS BIOPSY MEDICATIONS: None. ANESTHESIA/SEDATION: Moderate (conscious) sedation was employed during this procedure. A total of Versed 3.0 mg and Fentanyl 50.0 mcg was administered intravenously. Moderate Sedation Time: 15 minutes. The patient's level of consciousness and vital signs were monitored continuously by radiology nursing throughout the procedure under my direct supervision. FLUOROSCOPY TIME:  Fluoroscopy Time: 9 COMPLICATIONS: None immediate. PROCEDURE: After discussing the risks and benefits  of this procedure with the patient informed consent was obtained local anesthesia administered 1% lidocaine. Under CT guidance CT-directed liver mass biopsy was performed without difficulty. Two 13 mm 18 gauge core samples obtained and sent to pathology. Post  biopsy CT revealed no complications. Patient was transferred to specials recovery. Patient was evaluated in specials recovery and noted to be stable. Discharge instructions were given to him and his wife. Follow-up is with oncology. IMPRESSION: Successful CT directed liver mass biopsy. Electronically Signed   By: Marcello Moores  Register   On: 05/24/2019 12:22      ASSESSMENT & PLAN:  1. Pancreatic cancer metastasized to liver (HCC)   2. Dehydration   3. Normocytic anemia   4. Liver metastasis ([place])   5. Encounter for antineoplastic chemotherapy    #Metastatic pancreatic cancer, Labs reviewed and discussed with patient. Lab counts are acceptable to proceed with cycle 1 day 15 gemcitabine and Abraxane treatment today.   Tolerated day 1 and day 8 treatments well. Omniseq test showed PD-L1 50% TPS, TMB high, negative for BRCA1/2 or NTRK fusion.  MSI could not be completed.    #Syncope event/dehydration Recommend patient to continue stay well-hydrated. His creatinine 1.33, proceed with 1 L of IV fluid today.  #Normocytic anemia, hemoglobin 9.6, chemotherapy-induced.  Has a history of iron deficiency. Continue oral iron supplementation.  Continue to monitor.  #Follow-up with palliative care service Will need to refer to genetic testing  All questions were answered. The patient knows to call the clinic with any problems questions or concerns.  cc Trinna Post, PA-C   Return of visit:2 weeks.   Earlie Server, MD, PhD Hematology Oncology [place] [facility] 06/21/2019

## 2019-06-21 NOTE — Progress Notes (Signed)
MD, Dr. Tasia Catchings, notified and already aware of today's ALT and AST results. Per MD order: proceed with scheduled Abraxane and Gemzar treatment today.

## 2019-06-25 ENCOUNTER — Telehealth: Payer: Self-pay

## 2019-06-25 NOTE — Telephone Encounter (Signed)
Omniseq gene profiling is back and is scanned under the media tab.

## 2019-06-26 ENCOUNTER — Inpatient Hospital Stay: Payer: Medicare Other

## 2019-06-26 ENCOUNTER — Inpatient Hospital Stay (HOSPITAL_BASED_OUTPATIENT_CLINIC_OR_DEPARTMENT_OTHER): Payer: Medicare Other | Admitting: Nurse Practitioner

## 2019-06-26 ENCOUNTER — Telehealth: Payer: Self-pay | Admitting: *Deleted

## 2019-06-26 ENCOUNTER — Other Ambulatory Visit: Payer: Self-pay

## 2019-06-26 ENCOUNTER — Other Ambulatory Visit: Payer: Self-pay | Admitting: *Deleted

## 2019-06-26 ENCOUNTER — Telehealth: Payer: Self-pay

## 2019-06-26 VITALS — BP 152/70 | HR 80 | Temp 97.0°F | Resp 18

## 2019-06-26 DIAGNOSIS — Z95828 Presence of other vascular implants and grafts: Secondary | ICD-10-CM

## 2019-06-26 DIAGNOSIS — R197 Diarrhea, unspecified: Secondary | ICD-10-CM

## 2019-06-26 DIAGNOSIS — N179 Acute kidney failure, unspecified: Secondary | ICD-10-CM | POA: Diagnosis not present

## 2019-06-26 DIAGNOSIS — C787 Secondary malignant neoplasm of liver and intrahepatic bile duct: Secondary | ICD-10-CM

## 2019-06-26 DIAGNOSIS — I959 Hypotension, unspecified: Secondary | ICD-10-CM

## 2019-06-26 DIAGNOSIS — C259 Malignant neoplasm of pancreas, unspecified: Secondary | ICD-10-CM

## 2019-06-26 DIAGNOSIS — Z5111 Encounter for antineoplastic chemotherapy: Secondary | ICD-10-CM | POA: Diagnosis not present

## 2019-06-26 DIAGNOSIS — K1231 Oral mucositis (ulcerative) due to antineoplastic therapy: Secondary | ICD-10-CM | POA: Diagnosis not present

## 2019-06-26 LAB — CBC WITH DIFFERENTIAL/PLATELET
Band Neutrophils: 4 %
Basophils Absolute: 0 10*3/uL (ref 0.0–0.1)
Basophils Relative: 1 %
Blasts: 0 %
Eosinophils Absolute: 0 10*3/uL (ref 0.0–0.5)
Eosinophils Relative: 1 %
HCT: 26.4 % — ABNORMAL LOW (ref 39.0–52.0)
Hemoglobin: 8.4 g/dL — ABNORMAL LOW (ref 13.0–17.0)
Lymphocytes Relative: 23 %
Lymphs Abs: 0.4 10*3/uL — ABNORMAL LOW (ref 0.7–4.0)
MCH: 27.2 pg (ref 26.0–34.0)
MCHC: 31.8 g/dL (ref 30.0–36.0)
MCV: 85.4 fL (ref 80.0–100.0)
Metamyelocytes Relative: 1 %
Monocytes Absolute: 0.1 10*3/uL (ref 0.1–1.0)
Monocytes Relative: 4 %
Myelocytes: 1 %
Neutro Abs: 1.3 10*3/uL — ABNORMAL LOW (ref 1.7–7.7)
Neutrophils Relative %: 65 %
Other: 0 %
Platelets: 309 10*3/uL (ref 150–400)
Promyelocytes Relative: 0 %
RBC: 3.09 MIL/uL — ABNORMAL LOW (ref 4.22–5.81)
RDW: 14.6 % (ref 11.5–15.5)
Smear Review: ADEQUATE
WBC: 1.8 10*3/uL — ABNORMAL LOW (ref 4.0–10.5)
nRBC: 0 % (ref 0.0–0.2)
nRBC: 0 /100 WBC

## 2019-06-26 LAB — COMPREHENSIVE METABOLIC PANEL
ALT: 327 U/L — ABNORMAL HIGH (ref 0–44)
AST: 129 U/L — ABNORMAL HIGH (ref 15–41)
Albumin: 3.1 g/dL — ABNORMAL LOW (ref 3.5–5.0)
Alkaline Phosphatase: 71 U/L (ref 38–126)
Anion gap: 13 (ref 5–15)
BUN: 46 mg/dL — ABNORMAL HIGH (ref 8–23)
CO2: 20 mmol/L — ABNORMAL LOW (ref 22–32)
Calcium: 8.9 mg/dL (ref 8.9–10.3)
Chloride: 99 mmol/L (ref 98–111)
Creatinine, Ser: 2.11 mg/dL — ABNORMAL HIGH (ref 0.61–1.24)
GFR calc Af Amer: 35 mL/min — ABNORMAL LOW (ref >60)
GFR calc non Af Amer: 30 mL/min — ABNORMAL LOW (ref >60)
Glucose, Bld: 131 mg/dL — ABNORMAL HIGH (ref 70–99)
Potassium: 4 mmol/L (ref 3.5–5.1)
Sodium: 132 mmol/L — ABNORMAL LOW (ref 135–145)
Total Bilirubin: 1.1 mg/dL (ref 0.3–1.2)
Total Protein: 7.3 g/dL (ref 6.5–8.1)

## 2019-06-26 LAB — MAGNESIUM: Magnesium: 2.3 mg/dL (ref 1.7–2.4)

## 2019-06-26 MED ORDER — SODIUM CHLORIDE 0.9 % IV SOLN
Freq: Once | INTRAVENOUS | Status: AC
  Administered 2019-06-26: 12:00:00 via INTRAVENOUS
  Filled 2019-06-26: qty 250

## 2019-06-26 MED ORDER — DIPHENOXYLATE-ATROPINE 2.5-0.025 MG PO TABS: 1.0000 | ORAL_TABLET | Freq: Four times a day (QID) | ORAL | 0 refills | Status: DC | PRN

## 2019-06-26 MED ORDER — MAGIC MOUTHWASH W/LIDOCAINE: 5.0000 mL | Freq: Four times a day (QID) | ORAL | 0 refills | Status: DC | PRN

## 2019-06-26 MED ORDER — HEPARIN SOD (PORK) LOCK FLUSH 100 UNIT/ML IV SOLN
500.0000 [IU] | Freq: Once | INTRAVENOUS | Status: AC
  Administered 2019-06-26: 500 [IU] via INTRAVENOUS

## 2019-06-26 MED ORDER — SODIUM CHLORIDE 0.9% FLUSH
10.0000 mL | Freq: Once | INTRAVENOUS | Status: AC
  Administered 2019-06-26: 10 mL via INTRAVENOUS
  Filled 2019-06-26: qty 10

## 2019-06-26 NOTE — Telephone Encounter (Signed)
If Dr. Tasia Catchings is not able to accommodate we can see him in [facility]. Please contact patient for [name] visit with labs including stool studies. Thanks!

## 2019-06-26 NOTE — Telephone Encounter (Signed)
Spoke to patient via telephone. He will be here at 11:00 for labs and to see [facility] with possible IV fluids. CBC, MET C, MAG, and stool studies were ordered.

## 2019-06-26 NOTE — Telephone Encounter (Signed)
This was handled by another nurse

## 2019-06-26 NOTE — Progress Notes (Signed)
Symptom Management [place]  [phone] [phone]  Patient Care Team: Paulene Floor as PCP - General (Physician Assistant) Clent Jacks, RN as Oncology Nurse Navigator   Name of the patient: Craig [name]  BJ:[id number]  [birth year]   Date of visit: 06/26/19  Diagnosis- Pancreatic Cancer  Chief complaint/ Reason for visit- Diarrhea  Heme/Onc history:  Oncology History  Pancreatic cancer metastasized to liver ([place])  06/01/2019 Initial Diagnosis   Pancreatic cancer metastasized to liver [facility])   06/07/2019 -  Chemotherapy   The patient had PACLitaxel-protein bound (ABRAXANE) chemo infusion 250 mg, 125 mg/m2 = 250 mg (100 % of original dose 125 mg/m2), Intravenous,  Once, 1 of 6 cycles Dose modification: 100 mg/m2 (original dose 125 mg/m2, Cycle 1, Reason: Provider Judgment), 125 mg/m2 (original dose 125 mg/m2, Cycle 1, Reason: Provider Judgment) Administration: 250 mg (06/07/2019), 250 mg (06/14/2019), 250 mg (06/21/2019) gemcitabine (GEMZAR) 2,000 mg in sodium chloride 0.9 % 250 mL chemo infusion, 2,090 mg, Intravenous,  Once, 1 of 6 cycles Administration: 2,000 mg (06/07/2019), 2,000 mg (06/14/2019)  for chemotherapy treatment.      Interval history- Craig [name], 73 year old male diagnosed with pancreatic cancer metastasized to liver, who presents to symptom management clinic for diarrhea.  He initiated chemotherapy on 06/21/2019  He previously experienced syncopal episode after working outdoors and he became dehydrated.  911 was called and he was advised to increase oral fluids.  On 06/21/2019 his creatinine was 1.33 and he received additional liter of IV fluids along with his chemotherapy.  Today, he says that he has been having diarrhea and that "everything runs straight through him including water". Symptoms started Sunday and have persisted since that time. He complains of multiple, nearly constant diarrhea.  Described as watery. He hasn't taken any anti-diarrheal medications. Nothing seems to make symptoms better. Says he feels tired and dizzy due to symptoms and is afraid to leave the house or eat or drink. He hasn't been evaluated for these symptoms previously. Denies fever or illness. Denies nausea, vomiting. Denies abdominal pain. Endorses weight loss.   He takes Valsartan, hydrochlorothiazide, Cardizem, and carvedilol; last doses were last night. He doesn't regularly check his pressure at home. Is followed by Dr. Nehemiah Massed.   He also complains of sore mouth which started after chemotherapy. He says it feels tender and painful to swallow.   ECOG FS:2 - Symptomatic, <50% confined to bed  Review of systems- Review of Systems  Constitutional: Positive for malaise/fatigue and weight loss. Negative for chills, diaphoresis and fever.  HENT: Negative for congestion and sore throat.   Eyes: Negative for blurred vision and redness.  Respiratory: Negative for cough, sputum production and shortness of breath.   Cardiovascular: Negative for chest pain and palpitations.  Gastrointestinal: Positive for diarrhea. Negative for abdominal pain, blood in stool, constipation, heartburn, melena, nausea and vomiting.  Musculoskeletal: Negative for falls, joint pain and myalgias.  Skin: Negative for itching and rash.  Neurological: Positive for dizziness and weakness. Negative for loss of consciousness and headaches.  Endo/Heme/Allergies: Negative for environmental allergies. Does not bruise/bleed easily.  Psychiatric/Behavioral: Negative for depression. The patient is not nervous/anxious and does not have insomnia.      Current treatment-gemcitabine and Abraxane  Allergies  Allergen Reactions   Lipitor [Atorvastatin] Other (See Comments)    MYALGIA   Zetia [Ezetimibe] Other (See Comments)    MYALGIA   Lisinopril Cough   Protonix [Pantoprazole] Other (See Comments)  headache   Spironolactone Other  (See Comments)    Breast tenderness    Past Medical History:  Diagnosis Date   Allergic rhinitis    Cancer ([place])    new dx Pancreatic Cancer - Aug 2020   CHF (congestive heart failure) (HCC)    Chronic cholecystitis    Coronary artery disease    DDD (degenerative disc disease), cervical    Dehydration 06/21/2019   Dyspnea    Early cataract    Erectile dysfunction    GERD (gastroesophageal reflux disease)    Gouty arthritis    Headache    occasional migraines   Hypercalcemia    Hyperlipemia    Hypertension    Palpitations    Peripheral vascular disease (HCC)    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   LIMA to LAD SVG to [place] SVG SEQUENTIALLY to OM1 and OM2 SVG to ACUTE MARGINAL   Spinal stenosis of cervical region    Vitamin D deficiency     Past Surgical History:  Procedure Laterality Date   BACK SURGERY  2018    fusion with screws   CHOLECYSTECTOMY N/A 11/13/2018   Procedure: LAPAROSCOPIC CHOLECYSTECTOMY;  Surgeon: Vickie Epley, MD;  Location: [facility];  Service: General;  Laterality: N/A;   COLONOSCOPY     CORONARY ARTERY BYPASS GRAFT N/A 09/30/2017   Procedure: CORONARY ARTERY BYPASS GRAFTING times five using right and left Saphaneous vein harvested endoscopicly  and left internal mammary artery. (CABG),TEE;  Surgeon: Rexene Alberts, MD;  Location: [place];  Service: Open Heart Surgery;  Laterality: N/A;   LEFT HEART CATH AND CORONARY ANGIOGRAPHY Left 09/06/2017   Procedure: LEFT HEART CATH AND CORONARY ANGIOGRAPHY;  Surgeon: Corey Skains, MD;  Location: [place] [facility];  Service: Cardiovascular;  Laterality: Left;   percutaneous transluminal balloon angioplasty  01/2010   of left lower extremity   PORTA CATH INSERTION N/A 06/06/2019   Procedure: PORTA CATH INSERTION;  Surgeon: Katha Cabal, MD;  Location: [place] [facility];  Service: Cardiovascular;  Laterality: N/A;   TEE WITHOUT CARDIOVERSION N/A 09/30/2017    Procedure: TRANSESOPHAGEAL ECHOCARDIOGRAM (TEE);  Surgeon: Rexene Alberts, MD;  Location: [place];  Service: Open Heart Surgery;  Laterality: N/A;   VASCULAR SURGERY Left 2010   left exernal iliac and superficial femoral artery PTCA and stenting    Social History   Socioeconomic History   Marital status: Married    Spouse name: Enid Derry   Number of children: 2   Years of education: Not on file   Highest education level: Not on file  Occupational History   Occupation: drove tractors    Comment: retired  Scientist, product/process development strain: Not hard at [employer] insecurity    Worry: Never true    Inability: Never true   Transportation needs    Medical: No    Non-medical: No  Tobacco Use   Smoking status: Former Smoker    Packs/day: 0.75    Years: 50.00    Pack years: 37.50    Types: Cigarettes    Quit date: 09/27/2017    Years since quitting: 1.7   Smokeless tobacco: Former Systems developer    Types: Chew  Substance and Sexual Activity   Alcohol use: Not Currently    Comment: stopped drinking before cabg   Drug use: Not Currently    Types: Marijuana    Comment: stopped smoking before CABG   Sexual activity: Not Currently  Lifestyle   Physical activity    Days per week: 5 days    Minutes per session: 150+ min   Stress: Not at all  Relationships   Social connections    Talks on phone: More than three times a week    Gets together: More than three times a week    Attends religious service: Never    Active member of club or organization: No    Attends meetings of clubs or organizations: Never    Relationship status: Married   Intimate partner violence    Fear of current or ex partner: Not on file    Emotionally abused: Not on file    Physically abused: Not on file    Forced sexual activity: Not on file  Other Topics Concern   Not on file  Social History Narrative   Not on file    Family History  Problem Relation Age of Onset   Brain cancer  Mother    Emphysema Father    Cancer Brother      Current Outpatient Medications:    acetaminophen (TYLENOL) 500 MG tablet, Take 2 tablets (1,000 mg total) by mouth every 6 (six) hours. (Patient taking differently: Take 1,000 mg by mouth every 6 (six) hours as needed for moderate pain. ), Disp: 30 tablet, Rfl: 0   aspirin EC 81 MG tablet, Take 81 mg by mouth daily., Disp: , Rfl:    carvedilol (COREG) 6.25 MG tablet, Take 6.25 mg by mouth 2 (two) times daily with a meal., Disp: , Rfl: 3   diltiazem (CARDIZEM CD) 300 MG 24 hr capsule, Take 1 capsule (300 mg total) by mouth daily., Disp: 90 capsule, Rfl: 1   dorzolamide-timolol (COSOPT) 22.3-6.8 MG/ML ophthalmic solution, INSTILL ONE DROP INTO BOTH EYES TWICE DAILY, Disp: , Rfl:    ferrous sulfate 325 (65 FE) MG tablet, TAKE 1 TABLET BY MOUTH EVERY DAY WITH BREAKFAST, Disp: 90 tablet, Rfl: 1   gabapentin (NEURONTIN) 300 MG capsule, Take 2 capsules (600 mg total) by mouth 3 (three) times daily., Disp: 540 capsule, Rfl: 0   hydrochlorothiazide (HYDRODIURIL) 25 MG tablet, Take 1 tablet (25 mg total) by mouth daily., Disp: 90 tablet, Rfl: 3   latanoprost (XALATAN) 0.005 % ophthalmic solution, Place 1 drop into both eyes at bedtime. , Disp: , Rfl: 3   lidocaine-prilocaine (EMLA) cream, Apply to affected area once, Disp: 30 g, Rfl: 3   Multiple Vitamin (MULTIVITAMIN WITH MINERALS) TABS tablet, Take 1 tablet by mouth daily., Disp: , Rfl:    OMEGA-3 FATTY ACIDS PO, Take 1,200 mg by mouth daily. , Disp: , Rfl:    omeprazole (PRILOSEC) 40 MG capsule, TAKE 1 CAPSULE BY MOUTH EVERY DAY, Disp: 90 capsule, Rfl: 0   ondansetron (ZOFRAN) 8 MG tablet, Take 1 tablet (8 mg total) by mouth 2 (two) times daily as needed (Nausea or vomiting)., Disp: 30 tablet, Rfl: 1   prochlorperazine (COMPAZINE) 10 MG tablet, Take 1 tablet (10 mg total) by mouth every 6 (six) hours as needed (Nausea or vomiting)., Disp: 30 tablet, Rfl: 1   rosuvastatin (CRESTOR) 10  MG tablet, Take 1 tablet (10 mg total) by mouth daily., Disp: 90 tablet, Rfl: 0   sucralfate (CARAFATE) 1 g tablet, Take 1 tablet (1 g total) by mouth 4 (four) times daily -  with meals and at bedtime., Disp: 28 tablet, Rfl: 0   triamcinolone cream (KENALOG) 0.1 %, APPLY 1 APPLICATION TOPICALLY 2 (TWO) TIMES DAILY. TO EXTERNAL EAR, Disp:  30 g, Rfl: 0   triamcinolone ointment (KENALOG) 0.5 %, Apply 1 application topically 3 (three) times daily., Disp: 30 g, Rfl: 0   valsartan (DIOVAN) 160 MG tablet, Take 1 tablet (160 mg total) by mouth daily., Disp: 180 tablet, Rfl: 3  Physical exam:  Vitals:   06/26/19 1135  BP: (!) 84/62  Pulse: 94  Resp: 18  Temp: (!) 97 F (36.1 C)  TempSrc: Tympanic   Physical Exam Constitutional:      General: He is not in acute distress.    Appearance: Normal appearance.     Comments: Wearing mask; unaccompanied  HENT:     Head: Normocephalic and atraumatic.     Nose: No congestion or rhinorrhea.     Mouth/Throat:     Mouth: Mucous membranes are dry.     Pharynx: Posterior oropharyngeal erythema present.  Eyes:     General: No scleral icterus.    Conjunctiva/sclera: Conjunctivae normal.  Cardiovascular:     Rate and Rhythm: Normal rate and regular rhythm.     Pulses: Normal pulses.  Pulmonary:     Effort: Pulmonary effort is normal.     Breath sounds: Normal breath sounds.  Abdominal:     General: There is no distension.     Palpations: Abdomen is soft.     Tenderness: There is no abdominal tenderness.  Musculoskeletal:     Right lower leg: No edema.     Left lower leg: No edema.  Skin:    General: Skin is warm and dry.  Neurological:     Mental Status: He is alert and oriented to person, place, and time.     Motor: No weakness.  Psychiatric:        Mood and Affect: Mood normal.        Behavior: Behavior normal.      CMP Latest Ref Rng & Units 06/26/2019  Glucose 70 - 99 mg/dL 131(H)  BUN 8 - 23 mg/dL 46(H)  Creatinine 0.61 - 1.24  mg/dL 2.11(H)  Sodium 135 - 145 mmol/L 132(L)  Potassium 3.5 - 5.1 mmol/L 4.0  Chloride 98 - 111 mmol/L 99  CO2 22 - 32 mmol/L 20(L)  Calcium 8.9 - 10.3 mg/dL 8.9  Total Protein 6.5 - 8.1 g/dL 7.3  Total Bilirubin 0.3 - 1.2 mg/dL 1.1  Alkaline Phos 38 - 126 U/L 71  AST 15 - 41 U/L 129(H)  ALT 0 - 44 U/L 327(H)   CBC Latest Ref Rng & Units 06/26/2019  WBC 4.0 - 10.5 K/uL 1.8(L)  Hemoglobin 13.0 - 17.0 g/dL 8.4(L)  Hematocrit 39.0 - 52.0 % 26.4(L)  Platelets 150 - 400 K/uL 309    No images are attached to the encounter.  Nm Pet Image Initial (pi) Skull Base To Thigh  Result Date: 06/04/2019 CLINICAL DATA:  Initial treatment strategy for stage IV pancreatic cancer with biopsy confirmed liver metastasis. EXAM: NUCLEAR MEDICINE PET SKULL BASE TO THIGH TECHNIQUE: 11.0 mCi F-18 FDG was injected intravenously. Full-ring PET imaging was performed from the skull base to thigh after the radiotracer. CT data was obtained and used for attenuation correction and anatomic localization. Fasting blood glucose: 123 mg/dl COMPARISON:  05/16/2019 CT abdomen/pelvis. 05/10/2019 screening chest CT. FINDINGS: Mediastinal blood pool activity: SUV max 3.2 Liver activity: SUV max NA NECK: No hypermetabolic lymph nodes in the neck. Incidental CT findings: none CHEST: Bilateral hypermetabolic hilar foci with max SUV 5.2 on the left and 3.5 on the right. No enlarged or hypermetabolic axillary  or mediastinal lymph nodes. No hypermetabolic pulmonary findings. A few scattered solid right pulmonary nodules, largest 4 mm in the basilar right lower lobe (series 3/image 125), below PET resolution. The pleural based solid density 4.3 x 2.3 cm structure at the extreme medial left lung base (series 3/image 129) is non hypermetabolic. Incidental CT findings: Three-vessel coronary atherosclerosis status post CABG. Atherosclerotic nonaneurysmal thoracic aorta. Dilated main pulmonary artery (3.6 cm diameter). Intact sternotomy wires.  ABDOMEN/PELVIS: Intensely hypermetabolic uncinate process pancreatic mass measuring approximately 4.2 x 2.6 cm with max SUV 14.4 (series 3/image 163). Enlarged hypermetabolic 1.4 cm portacaval node with max SUV 10.5 (series 3/image 155). Several small hypermetabolic aortocaval and left para-aortic lymph nodes, largest 1.0 cm in the aortocaval chain with max SUV 6.2 (series 3/image 171). Several (at least 5) hypermetabolic liver masses scattered throughout the liver, largest 3.5 cm in the segment 6 right liver lobe with max SUV 12.6 (series 3/image 152), 2.4 cm in the central segment 5 right liver lobe with max SUV 13.6 (series 3/image 147) and 2.3 cm in posterior right liver lobe adjacent to the IVC with max SUV 10.9 (series 3/image 147). No abnormal hypermetabolic activity within the adrenal glands or spleen. No hypermetabolic lymph nodes in the pelvis. Incidental CT findings: Cholecystectomy. Simple 4.6 cm lower right renal cyst. Simple 1.5 cm interpolar left renal cyst. Atherosclerotic nonaneurysmal abdominal aorta. SKELETON: Faint hypermetabolism in the medial right iliac bone with associated faint sclerosis with max SUV 3.3 (series 3/image 215). No additional hypermetabolic osseous foci. Incidental CT findings: none IMPRESSION: 1. Intensely hypermetabolic uncinate process pancreatic mass, compatible with primary pancreatic adenocarcinoma. 2. Hypermetabolic portacaval, aortocaval and left para-aortic nodal metastases. Bilateral hilar nodal hypermetabolism is also compatible with nodal metastatic disease. 3. Several (at least 5) hypermetabolic liver metastases scattered throughout the liver. 4. Equivocal faint hypermetabolism in the medial right iliac bone with associated faint sclerosis, osseous metastasis not excluded, although potentially degenerative. 5. Tiny scattered right pulmonary nodules, below PET resolution, recommend attention on follow-up chest CT in 3-6 months. 6. Chronic findings include: Aortic  Atherosclerosis (ICD10-I70.0). Dilated main pulmonary artery, suggesting pulmonary arterial hypertension. Electronically Signed   By: Ilona Sorrel M.D.   On: 06/04/2019 13:56    Assessment and plan- Patient is a 73 y.o. male diagnosed with pancreatic cancer, currently on chemotherapy, who presents to symptom management clinic for diarrhea.  1. Diarrhea- suspect chemotherapy induced. Will check stool studies to evaluate further. Start lomotil.  Continue to monitor  2. Increased LFTs- avoid tylenol, alcohol and other hepatotoxic substances. Keep statin for now but if symptoms worsen consider holding.  Continue to monitor  3. Increased creatinine/AKI- likely secondary to fluid loss & diarrhea. Creatnine 2.11 today. IV fluids in clinic today. Recheck labs later this week.   4. Hypotension- likely secondary to fluid/GI loss. Discussed with Haskell Riling, PA at [facility] office symptoms and medications. She agrees with recommendation to hold hctz and valsartan today. Continue carvedilol and diltiazem given improvement in BP after fluids. Consider holding carvedilol if symptoms persist.  Encouraged checking blood pressure at home  5. Chemotherapy induced mucositis- start magic mouthwash qid prn for pain.   Disposition:  - rtc on 06/28/2019 for labs (CBC & CMP) and re-evaluation in Symptom Management Clinic. Consider IV fluids at that time.     Visit Diagnosis 1. Diarrhea, unspecified type   2. AKI (acute kidney injury) ([place])   3. Hypotension, unspecified hypotension type   4. Mucositis due to chemotherapy  Patient expressed understanding and was in agreement with this plan. He also understands that He can call clinic at any time with any questions, concerns, or complaints.   Thank you for allowing me to participate in the care of this very pleasant patient.   Beckey Rutter, DNP, AGNP-C [place] at [facility] [phone] (work cell) [phone] (office)

## 2019-06-26 NOTE — Telephone Encounter (Signed)
-----   Message from Clancy Gourd sent at 06/26/2019  8:56 AM EDT ----- Regarding: Pt Requests An Appointment Contact: [phone] Hello,  I received an answering service message from this pt on today at 7:59 am stating that he would like to make an appt due to him keep having to run to the bathroom. He is requesting a callback at [phone] to discuss. Thanks.

## 2019-06-26 NOTE — Telephone Encounter (Signed)
Can [facility] evaluate him today?

## 2019-06-27 ENCOUNTER — Other Ambulatory Visit: Payer: Self-pay

## 2019-06-28 ENCOUNTER — Encounter: Payer: Self-pay | Admitting: Nurse Practitioner

## 2019-06-28 ENCOUNTER — Other Ambulatory Visit: Payer: Self-pay

## 2019-06-28 ENCOUNTER — Inpatient Hospital Stay (HOSPITAL_BASED_OUTPATIENT_CLINIC_OR_DEPARTMENT_OTHER): Payer: Medicare Other | Admitting: Nurse Practitioner

## 2019-06-28 ENCOUNTER — Inpatient Hospital Stay: Payer: Medicare Other

## 2019-06-28 ENCOUNTER — Other Ambulatory Visit: Payer: Self-pay | Admitting: Oncology

## 2019-06-28 ENCOUNTER — Other Ambulatory Visit: Payer: Self-pay | Admitting: *Deleted

## 2019-06-28 VITALS — BP 111/66 | HR 93 | Temp 97.0°F | Wt 179.8 lb

## 2019-06-28 DIAGNOSIS — Z550 Illiteracy and low-level literacy: Secondary | ICD-10-CM

## 2019-06-28 DIAGNOSIS — C259 Malignant neoplasm of pancreas, unspecified: Secondary | ICD-10-CM

## 2019-06-28 DIAGNOSIS — R945 Abnormal results of liver function studies: Secondary | ICD-10-CM | POA: Diagnosis not present

## 2019-06-28 DIAGNOSIS — C787 Secondary malignant neoplasm of liver and intrahepatic bile duct: Secondary | ICD-10-CM

## 2019-06-28 DIAGNOSIS — D6481 Anemia due to antineoplastic chemotherapy: Secondary | ICD-10-CM

## 2019-06-28 DIAGNOSIS — N179 Acute kidney failure, unspecified: Secondary | ICD-10-CM

## 2019-06-28 DIAGNOSIS — I951 Orthostatic hypotension: Secondary | ICD-10-CM

## 2019-06-28 DIAGNOSIS — Z5111 Encounter for antineoplastic chemotherapy: Secondary | ICD-10-CM | POA: Diagnosis not present

## 2019-06-28 DIAGNOSIS — T451X5A Adverse effect of antineoplastic and immunosuppressive drugs, initial encounter: Secondary | ICD-10-CM

## 2019-06-28 DIAGNOSIS — Z95828 Presence of other vascular implants and grafts: Secondary | ICD-10-CM

## 2019-06-28 DIAGNOSIS — R739 Hyperglycemia, unspecified: Secondary | ICD-10-CM

## 2019-06-28 DIAGNOSIS — R197 Diarrhea, unspecified: Secondary | ICD-10-CM

## 2019-06-28 DIAGNOSIS — R7989 Other specified abnormal findings of blood chemistry: Secondary | ICD-10-CM

## 2019-06-28 LAB — CBC WITH DIFFERENTIAL/PLATELET
Abs Immature Granulocytes: 0.25 10*3/uL — ABNORMAL HIGH (ref 0.00–0.07)
Basophils Absolute: 0 10*3/uL (ref 0.0–0.1)
Basophils Relative: 1 %
Eosinophils Absolute: 0 10*3/uL (ref 0.0–0.5)
Eosinophils Relative: 0 %
HCT: 30.7 % — ABNORMAL LOW (ref 39.0–52.0)
Hemoglobin: 9.7 g/dL — ABNORMAL LOW (ref 13.0–17.0)
Immature Granulocytes: 5 %
Lymphocytes Relative: 13 %
Lymphs Abs: 0.6 10*3/uL — ABNORMAL LOW (ref 0.7–4.0)
MCH: 27.3 pg (ref 26.0–34.0)
MCHC: 31.6 g/dL (ref 30.0–36.0)
MCV: 86.5 fL (ref 80.0–100.0)
Monocytes Absolute: 0.3 10*3/uL (ref 0.1–1.0)
Monocytes Relative: 5 %
Neutro Abs: 3.6 10*3/uL (ref 1.7–7.7)
Neutrophils Relative %: 76 %
Platelets: 331 10*3/uL (ref 150–400)
RBC: 3.55 MIL/uL — ABNORMAL LOW (ref 4.22–5.81)
RDW: 14.9 % (ref 11.5–15.5)
WBC: 4.8 10*3/uL (ref 4.0–10.5)
nRBC: 0 % (ref 0.0–0.2)

## 2019-06-28 LAB — COMPREHENSIVE METABOLIC PANEL
ALT: 368 U/L — ABNORMAL HIGH (ref 0–44)
AST: 223 U/L — ABNORMAL HIGH (ref 15–41)
Albumin: 3.3 g/dL — ABNORMAL LOW (ref 3.5–5.0)
Alkaline Phosphatase: 123 U/L (ref 38–126)
Anion gap: 11 (ref 5–15)
BUN: 41 mg/dL — ABNORMAL HIGH (ref 8–23)
CO2: 18 mmol/L — ABNORMAL LOW (ref 22–32)
Calcium: 9.3 mg/dL (ref 8.9–10.3)
Chloride: 106 mmol/L (ref 98–111)
Creatinine, Ser: 1.71 mg/dL — ABNORMAL HIGH (ref 0.61–1.24)
GFR calc Af Amer: 45 mL/min — ABNORMAL LOW (ref >60)
GFR calc non Af Amer: 39 mL/min — ABNORMAL LOW (ref >60)
Glucose, Bld: 186 mg/dL — ABNORMAL HIGH (ref 70–99)
Potassium: 4.7 mmol/L (ref 3.5–5.1)
Sodium: 135 mmol/L (ref 135–145)
Total Bilirubin: 0.7 mg/dL (ref 0.3–1.2)
Total Protein: 7.6 g/dL (ref 6.5–8.1)

## 2019-06-28 MED ORDER — SODIUM CHLORIDE 0.9 % IV SOLN
Freq: Once | INTRAVENOUS | Status: AC
  Administered 2019-06-28: 12:00:00 via INTRAVENOUS
  Filled 2019-06-28: qty 250

## 2019-06-28 MED ORDER — HEPARIN SOD (PORK) LOCK FLUSH 100 UNIT/ML IV SOLN
500.0000 [IU] | Freq: Once | INTRAVENOUS | Status: AC
  Administered 2019-06-28: 500 [IU] via INTRAVENOUS

## 2019-06-28 MED ORDER — SODIUM CHLORIDE 0.9% FLUSH
10.0000 mL | INTRAVENOUS | Status: DC | PRN
  Administered 2019-06-28: 10 mL via INTRAVENOUS
  Filled 2019-06-28: qty 10

## 2019-06-28 NOTE — Progress Notes (Signed)
[facility]  [phone] Fax:(336) [phone]  Patient Care Team: Paulene Floor as PCP - General (Physician Assistant) Clent Jacks, RN as Oncology Nurse Navigator   Name of the patient: Craig [name]  [id number]  1946-09-22   Date of visit: 06/28/19  Diagnosis- Pancreatic Cancer  Chief complaint/ Reason for visit- Re-evaluation of diarrhea & abnormal labs  Heme/Onc history:  Oncology History  Pancreatic cancer metastasized to liver ([place])  06/01/2019 Initial Diagnosis   Pancreatic cancer metastasized to liver [facility])   06/07/2019 -  Chemotherapy   The patient had PACLitaxel-protein bound (ABRAXANE) chemo infusion 250 mg, 125 mg/m2 = 250 mg (100 % of original dose 125 mg/m2), Intravenous,  Once, 1 of 6 cycles Dose modification: 100 mg/m2 (original dose 125 mg/m2, Cycle 1, Reason: Provider Judgment), 125 mg/m2 (original dose 125 mg/m2, Cycle 1, Reason: Provider Judgment) Administration: 250 mg (06/07/2019), 250 mg (06/14/2019), 250 mg (06/21/2019) gemcitabine (GEMZAR) 2,000 mg in sodium chloride 0.9 % 250 mL chemo infusion, 2,090 mg, Intravenous,  Once, 1 of 6 cycles Administration: 2,000 mg (06/07/2019), 2,000 mg (06/14/2019)  for chemotherapy treatment.      Interval history- Craig [name], 73 year old male diagnosed with pancreatic cancer metastasized to liver, who presents to symptom management clinic for diarrhea.  He initiated chemotherapy on 06/21/2019. He has been taking lomotil for diarrhea and now has 1-2 episodes per day which is significantly improved. He says he has not been holding his blood pressure medication and took the 4 medications yesterday then felt very light headed and had to come lay down. Eating and drinking has improved but not yet to baseline. He has not taken bp medication today.  He says he has very limited reading capabilities and has a difficult time with his medications.  He asks that I  include his daughter, Ivin Booty, in discussing medication changes.  He feels that his mouth soreness is somewhat improved.  He has been using mouthwash he was prescribed.  He denies dizziness, recent fevers or illness.  Denies chest pain.  Denies nausea or vomiting.  Denies constipation.  Denies urinary complaints.  ECOG FS:2 - Symptomatic, <50% confined to bed  Review of systems- Review of Systems  Constitutional: Positive for malaise/fatigue and weight loss. Negative for chills and fever.  HENT: Positive for sore throat. Negative for hearing loss, nosebleeds and tinnitus.   Eyes: Negative for blurred vision and double vision.  Respiratory: Negative for cough, hemoptysis, shortness of breath and wheezing.   Cardiovascular: Negative for chest pain, palpitations and leg swelling.  Gastrointestinal: Positive for diarrhea. Negative for abdominal pain, blood in stool, constipation, melena, nausea and vomiting.  Genitourinary: Negative for dysuria and urgency.  Musculoskeletal: Negative for back pain, falls, joint pain and myalgias.  Skin: Negative for itching and rash.  Neurological: Positive for dizziness and weakness. Negative for tingling, sensory change, loss of consciousness and headaches.  Endo/Heme/Allergies: Negative for environmental allergies. Does not bruise/bleed easily.  Psychiatric/Behavioral: Negative for depression. The patient is not nervous/anxious and does not have insomnia.      Current treatment-gemcitabine and Abraxane  Allergies  Allergen Reactions   Lipitor [Atorvastatin] Other (See Comments)    MYALGIA   Zetia [Ezetimibe] Other (See Comments)    MYALGIA   Lisinopril Cough   Protonix [Pantoprazole] Other (See Comments)    headache   Spironolactone Other (See Comments)    Breast tenderness    Past Medical History:  Diagnosis Date   Allergic rhinitis  Cancer [facility])    new dx Pancreatic Cancer - Aug 2020   CHF (congestive heart failure) (HCC)     Chronic cholecystitis    Coronary artery disease    DDD (degenerative disc disease), cervical    Dehydration 06/21/2019   Dyspnea    Early cataract    Erectile dysfunction    GERD (gastroesophageal reflux disease)    Gouty arthritis    Headache    occasional migraines   Hypercalcemia    Hyperlipemia    Hypertension    Palpitations    Peripheral vascular disease (HCC)    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   LIMA to LAD SVG to DISTAL CIRCUMFLEX SVG SEQUENTIALLY to OM1 and OM2 SVG to ACUTE MARGINAL   Spinal stenosis of cervical region    Vitamin D deficiency     Past Surgical History:  Procedure Laterality Date   BACK SURGERY  2018    fusion with screws   CHOLECYSTECTOMY N/A 11/13/2018   Procedure: LAPAROSCOPIC CHOLECYSTECTOMY;  Surgeon: Vickie Epley, MD;  Location: [facility] ORS;  Service: General;  Laterality: N/A;   COLONOSCOPY     CORONARY ARTERY BYPASS GRAFT N/A 09/30/2017   Procedure: CORONARY ARTERY BYPASS GRAFTING times five using right and left Saphaneous vein harvested endoscopicly  and left internal mammary artery. (CABG),TEE;  Surgeon: Rexene Alberts, MD;  Location: [place];  Service: Open Heart Surgery;  Laterality: N/A;   LEFT HEART CATH AND CORONARY ANGIOGRAPHY Left 09/06/2017   Procedure: LEFT HEART CATH AND CORONARY ANGIOGRAPHY;  Surgeon: Corey Skains, MD;  Location: [place] CV LAB;  Service: Cardiovascular;  Laterality: Left;   percutaneous transluminal balloon angioplasty  01/2010   of left lower extremity   PORTA CATH INSERTION N/A 06/06/2019   Procedure: PORTA CATH INSERTION;  Surgeon: Katha Cabal, MD;  Location: [place] CV LAB;  Service: Cardiovascular;  Laterality: N/A;   TEE WITHOUT CARDIOVERSION N/A 09/30/2017   Procedure: TRANSESOPHAGEAL ECHOCARDIOGRAM (TEE);  Surgeon: Rexene Alberts, MD;  Location: [place];  Service: Open Heart Surgery;  Laterality: N/A;   VASCULAR SURGERY Left 2010   left exernal iliac and superficial  femoral artery PTCA and stenting    Social History   Socioeconomic History   Marital status: Married    Spouse name: Enid Derry   Number of children: 2   Years of education: Not on file   Highest education level: Not on file  Occupational History   Occupation: drove tractors    Comment: retired  Scientist, product/process development strain: Not hard at [employer] insecurity    Worry: Never true    Inability: Never true   Transportation needs    Medical: No    Non-medical: No  Tobacco Use   Smoking status: Former Smoker    Packs/day: 0.75    Years: 50.00    Pack years: 37.50    Types: Cigarettes    Quit date: 09/27/2017    Years since quitting: 1.7   Smokeless tobacco: Former Systems developer    Types: Chew  Substance and Sexual Activity   Alcohol use: Not Currently    Comment: stopped drinking before cabg   Drug use: Not Currently    Types: Marijuana    Comment: stopped smoking before CABG   Sexual activity: Not Currently  Lifestyle   Physical activity    Days per week: 5 days    Minutes per session: 150+ min   Stress: Not at all  Relationships   Social connections    Talks on phone: More than three times a week    Gets together: More than three times a week    Attends religious service: Never    Active member of club or organization: No    Attends meetings of clubs or organizations: Never    Relationship status: Married   Intimate partner violence    Fear of current or ex partner: Not on file    Emotionally abused: Not on file    Physically abused: Not on file    Forced sexual activity: Not on file  Other Topics Concern   Not on file  Social History Narrative   Not on file    Family History  Problem Relation Age of Onset   Brain cancer Mother    Emphysema Father    Cancer Brother      Current Outpatient Medications:    acetaminophen (TYLENOL) 500 MG tablet, Take 2 tablets (1,000 mg total) by mouth every 6 (six) hours. (Patient taking  differently: Take 1,000 mg by mouth every 6 (six) hours as needed for moderate pain. ), Disp: 30 tablet, Rfl: 0   aspirin EC 81 MG tablet, Take 81 mg by mouth daily., Disp: , Rfl:    carvedilol (COREG) 6.25 MG tablet, Take 6.25 mg by mouth 2 (two) times daily with a meal., Disp: , Rfl: 3   diltiazem (CARDIZEM CD) 300 MG 24 hr capsule, Take 1 capsule (300 mg total) by mouth daily., Disp: 90 capsule, Rfl: 1   diphenoxylate-atropine (LOMOTIL) 2.5-0.025 MG tablet, Take 1 tablet by mouth 4 (four) times daily as needed for diarrhea or loose stools., Disp: 30 tablet, Rfl: 0   dorzolamide-timolol (COSOPT) 22.3-6.8 MG/ML ophthalmic solution, INSTILL ONE DROP INTO BOTH EYES TWICE DAILY, Disp: , Rfl:    ferrous sulfate 325 (65 FE) MG tablet, TAKE 1 TABLET BY MOUTH EVERY DAY WITH BREAKFAST, Disp: 90 tablet, Rfl: 1   gabapentin (NEURONTIN) 300 MG capsule, Take 2 capsules (600 mg total) by mouth 3 (three) times daily., Disp: 540 capsule, Rfl: 0   hydrochlorothiazide (HYDRODIURIL) 25 MG tablet, Take 1 tablet (25 mg total) by mouth daily., Disp: 90 tablet, Rfl: 3   rosuvastatin (CRESTOR) 10 MG tablet, Take 1 tablet (10 mg total) by mouth daily., Disp: 90 tablet, Rfl: 0   sucralfate (CARAFATE) 1 g tablet, Take 1 tablet (1 g total) by mouth 4 (four) times daily -  with meals and at bedtime., Disp: 28 tablet, Rfl: 0   triamcinolone cream (KENALOG) 0.1 %, APPLY 1 APPLICATION TOPICALLY 2 (TWO) TIMES DAILY. TO EXTERNAL EAR, Disp: 30 g, Rfl: 0   triamcinolone ointment (KENALOG) 0.5 %, Apply 1 application topically 3 (three) times daily., Disp: 30 g, Rfl: 0   valsartan (DIOVAN) 160 MG tablet, Take 1 tablet (160 mg total) by mouth daily., Disp: 180 tablet, Rfl: 3   latanoprost (XALATAN) 0.005 % ophthalmic solution, Place 1 drop into both eyes at bedtime. , Disp: , Rfl: 3   lidocaine-prilocaine (EMLA) cream, Apply to affected area once, Disp: 30 g, Rfl: 3   magic mouthwash w/lidocaine SOLN, Take 5 mLs by mouth  4 (four) times daily as needed for mouth pain., Disp: 480 mL, Rfl: 0   Multiple Vitamin (MULTIVITAMIN WITH MINERALS) TABS tablet, Take 1 tablet by mouth daily., Disp: , Rfl:    OMEGA-3 FATTY ACIDS PO, Take 1,200 mg by mouth daily. , Disp: , Rfl:    omeprazole (PRILOSEC) 40 MG capsule,  TAKE 1 CAPSULE BY MOUTH EVERY DAY, Disp: 90 capsule, Rfl: 0   ondansetron (ZOFRAN) 8 MG tablet, Take 1 tablet (8 mg total) by mouth 2 (two) times daily as needed (Nausea or vomiting)., Disp: 30 tablet, Rfl: 1   prochlorperazine (COMPAZINE) 10 MG tablet, Take 1 tablet (10 mg total) by mouth every 6 (six) hours as needed (Nausea or vomiting)., Disp: 30 tablet, Rfl: 1  Physical exam:  Vitals:   06/28/19 1047 06/28/19 1100  BP:  111/66  Pulse:  93  Temp:  (!) 97 F (36.1 C)  TempSrc:  Tympanic  Weight: 179 lb 12.8 oz (81.6 kg)    Physical Exam Constitutional:      General: He is not in acute distress. HENT:     Head: Normocephalic and atraumatic.     Nose: No rhinorrhea.     Mouth/Throat:     Mouth: Mucous membranes are dry.     Pharynx: Oropharynx is clear.  Eyes:     General: No scleral icterus.    Conjunctiva/sclera: Conjunctivae normal.  Cardiovascular:     Rate and Rhythm: Normal rate and regular rhythm.  Pulmonary:     Effort: Pulmonary effort is normal.     Breath sounds: Normal breath sounds.  Abdominal:     General: There is no distension.     Palpations: Abdomen is soft.     Tenderness: There is no abdominal tenderness.  Musculoskeletal:     Right lower leg: No edema.     Left lower leg: No edema.     Comments: No aids  Skin:    General: Skin is warm and dry.  Neurological:     Mental Status: He is alert and oriented to person, place, and time.     Motor: No weakness.  Psychiatric:        Mood and Affect: Mood is anxious.        Behavior: Behavior normal.      CMP Latest Ref Rng & Units 06/26/2019  Glucose 70 - 99 mg/dL 131(H)  BUN 8 - 23 mg/dL 46(H)  Creatinine 0.61 -  1.24 mg/dL 2.11(H)  Sodium 135 - 145 mmol/L 132(L)  Potassium 3.5 - 5.1 mmol/L 4.0  Chloride 98 - 111 mmol/L 99  CO2 22 - 32 mmol/L 20(L)  Calcium 8.9 - 10.3 mg/dL 8.9  Total Protein 6.5 - 8.1 g/dL 7.3  Total Bilirubin 0.3 - 1.2 mg/dL 1.1  Alkaline Phos 38 - 126 U/L 71  AST 15 - 41 U/L 129(H)  ALT 0 - 44 U/L 327(H)   CBC Latest Ref Rng & Units 06/28/2019  WBC 4.0 - 10.5 K/uL 4.8  Hemoglobin 13.0 - 17.0 g/dL 9.7(L)  Hematocrit 39.0 - 52.0 % 30.7(L)  Platelets 150 - 400 K/uL 331    No images are attached to the encounter.  Nm Pet Image Initial (pi) Skull Base To Thigh  Result Date: 06/04/2019 CLINICAL DATA:  Initial treatment strategy for stage IV pancreatic cancer with biopsy confirmed liver metastasis. EXAM: NUCLEAR MEDICINE PET SKULL BASE TO THIGH TECHNIQUE: 11.0 mCi F-18 FDG was injected intravenously. Full-ring PET imaging was performed from the skull base to thigh after the radiotracer. CT data was obtained and used for attenuation correction and anatomic localization. Fasting blood glucose: 123 mg/dl COMPARISON:  05/16/2019 CT abdomen/pelvis. 05/10/2019 screening chest CT. FINDINGS: Mediastinal blood pool activity: SUV max 3.2 Liver activity: SUV max NA NECK: No hypermetabolic lymph nodes in the neck. Incidental CT findings: none CHEST: Bilateral hypermetabolic  hilar foci with max SUV 5.2 on the left and 3.5 on the right. No enlarged or hypermetabolic axillary or mediastinal lymph nodes. No hypermetabolic pulmonary findings. A few scattered solid right pulmonary nodules, largest 4 mm in the basilar right lower lobe (series 3/image 125), below PET resolution. The pleural based solid density 4.3 x 2.3 cm structure at the extreme medial left lung base (series 3/image 129) is non hypermetabolic. Incidental CT findings: Three-vessel coronary atherosclerosis status post CABG. Atherosclerotic nonaneurysmal thoracic aorta. Dilated main pulmonary artery (3.6 cm diameter). Intact sternotomy wires.  ABDOMEN/PELVIS: Intensely hypermetabolic uncinate process pancreatic mass measuring approximately 4.2 x 2.6 cm with max SUV 14.4 (series 3/image 163). Enlarged hypermetabolic 1.4 cm portacaval node with max SUV 10.5 (series 3/image 155). Several small hypermetabolic aortocaval and left para-aortic lymph nodes, largest 1.0 cm in the aortocaval chain with max SUV 6.2 (series 3/image 171). Several (at least 5) hypermetabolic liver masses scattered throughout the liver, largest 3.5 cm in the segment 6 right liver lobe with max SUV 12.6 (series 3/image 152), 2.4 cm in the central segment 5 right liver lobe with max SUV 13.6 (series 3/image 147) and 2.3 cm in posterior right liver lobe adjacent to the IVC with max SUV 10.9 (series 3/image 147). No abnormal hypermetabolic activity within the adrenal glands or spleen. No hypermetabolic lymph nodes in the pelvis. Incidental CT findings: Cholecystectomy. Simple 4.6 cm lower right renal cyst. Simple 1.5 cm interpolar left renal cyst. Atherosclerotic nonaneurysmal abdominal aorta. SKELETON: Faint hypermetabolism in the medial right iliac bone with associated faint sclerosis with max SUV 3.3 (series 3/image 215). No additional hypermetabolic osseous foci. Incidental CT findings: none IMPRESSION: 1. Intensely hypermetabolic uncinate process pancreatic mass, compatible with primary pancreatic adenocarcinoma. 2. Hypermetabolic portacaval, aortocaval and left para-aortic nodal metastases. Bilateral hilar nodal hypermetabolism is also compatible with nodal metastatic disease. 3. Several (at least 5) hypermetabolic liver metastases scattered throughout the liver. 4. Equivocal faint hypermetabolism in the medial right iliac bone with associated faint sclerosis, osseous metastasis not excluded, although potentially degenerative. 5. Tiny scattered right pulmonary nodules, below PET resolution, recommend attention on follow-up chest CT in 3-6 months. 6. Chronic findings include: Aortic  Atherosclerosis (ICD10-I70.0). Dilated main pulmonary artery, suggesting pulmonary arterial hypertension. Electronically Signed   By: Ilona Sorrel M.D.   On: 06/04/2019 13:56    Assessment and plan- Patient is a 73 y.o. male diagnosed with pancreatic cancer, currently receiving gemcitabine and Abraxane, who presents to symptom management clinic for diarrhea and hypotension.  1. Diarrhea- awaiting stool samples but suspect chemo associated diarrhea. Discussed food choices that may improve or exacerbate diarrhea. Encouraged continued oral hydration & nutrition drinks.   2. AKI- creatinine 2.11 on 9/15, s/p iv fluids on 05/26/2019. Creatinine 1.71 today, BUN 41. GFR 45. Improved though still elevated from baseline. Likely secondary to GI fluid loss. IV fluids again today.   3. Abnormal LFTs- AST 223 (elevated), ALT 368 (elevated). Alk phos 123 (normal). Discussed in detail with Dr. Tasia Catchings who feels likely secondary to chemotherapy and recommends holding statin, tylenol, avoiding alcohol, and monitoring at this time. Will recheck labs next week to re-evaluate.   4. Orthostatic Hypotension- likely secondary to fluid loss, weight loss, and medications. Again discussed cardiology's recommendation to hold hctz and valsartan. Continue coreg and cardizem at this time.   5. Low level literacy- patient endorses low literacy level of himself and his wife. Discussed and return teaching demonstrated by patient regarding medications to take and not take. Also discussed  with his daughter Ivin Booty who will assist with medication management at home. May consider home health for medication management in the future.   6. Hyperglycemia- some history of elevated blood sugars. Denies history of diabetes but some serum glucose readings > 200 in his past. Will refer back to pcp for further evaluation and management   7. Normocytic anemia- hmg improved today at 9.7. Likely secondary to chemotherapy. Continue to monitor.    Disposition - RTC on Monday, 07/02/2019 to recheck labs (CBC, CMP), re-evaluation in Symptom Management Clinic, and possible IV fluids.   Visit Diagnosis 1. Pancreatic cancer metastasized to liver ([place])   2. Diarrhea, unspecified type   3. AKI (acute kidney injury) ([place])   4. Abnormal LFTs   5. Orthostatic hypotension   6. Low-level of literacy   7. Hyperglycemia   8. Anemia due to antineoplastic chemotherapy     Patient expressed understanding and was in agreement with this plan. He also understands that He can call clinic at any time with any questions, concerns, or complaints.   Thank you for allowing me to participate in the care of this very pleasant patient.   Beckey Rutter, DNP, AGNP-C [facility] [place] (work cell) [phone] (office)  CC: Faythe Casa, NP, Dr. Tasia Catchings, Carles Collet, [place] (PCP)

## 2019-06-29 ENCOUNTER — Encounter: Payer: Self-pay | Admitting: Nurse Practitioner

## 2019-06-29 ENCOUNTER — Other Ambulatory Visit: Payer: Self-pay

## 2019-06-29 DIAGNOSIS — R739 Hyperglycemia, unspecified: Secondary | ICD-10-CM | POA: Insufficient documentation

## 2019-06-29 DIAGNOSIS — N179 Acute kidney failure, unspecified: Secondary | ICD-10-CM | POA: Insufficient documentation

## 2019-06-29 DIAGNOSIS — R945 Abnormal results of liver function studies: Secondary | ICD-10-CM | POA: Insufficient documentation

## 2019-06-29 DIAGNOSIS — T451X5A Adverse effect of antineoplastic and immunosuppressive drugs, initial encounter: Secondary | ICD-10-CM | POA: Insufficient documentation

## 2019-06-29 DIAGNOSIS — R197 Diarrhea, unspecified: Secondary | ICD-10-CM | POA: Insufficient documentation

## 2019-06-29 DIAGNOSIS — Z550 Illiteracy and low-level literacy: Secondary | ICD-10-CM | POA: Insufficient documentation

## 2019-06-29 DIAGNOSIS — D6481 Anemia due to antineoplastic chemotherapy: Secondary | ICD-10-CM | POA: Insufficient documentation

## 2019-06-29 DIAGNOSIS — R7989 Other specified abnormal findings of blood chemistry: Secondary | ICD-10-CM | POA: Insufficient documentation

## 2019-06-29 DIAGNOSIS — I951 Orthostatic hypotension: Secondary | ICD-10-CM | POA: Insufficient documentation

## 2019-07-02 ENCOUNTER — Inpatient Hospital Stay: Payer: Medicare Other

## 2019-07-02 ENCOUNTER — Other Ambulatory Visit: Payer: Self-pay

## 2019-07-02 ENCOUNTER — Inpatient Hospital Stay (HOSPITAL_BASED_OUTPATIENT_CLINIC_OR_DEPARTMENT_OTHER): Payer: Medicare Other | Admitting: Oncology

## 2019-07-02 VITALS — BP 132/64 | HR 79 | Temp 98.0°F | Resp 18

## 2019-07-02 DIAGNOSIS — Z95828 Presence of other vascular implants and grafts: Secondary | ICD-10-CM

## 2019-07-02 DIAGNOSIS — C787 Secondary malignant neoplasm of liver and intrahepatic bile duct: Secondary | ICD-10-CM

## 2019-07-02 DIAGNOSIS — C259 Malignant neoplasm of pancreas, unspecified: Secondary | ICD-10-CM | POA: Diagnosis not present

## 2019-07-02 DIAGNOSIS — Z5111 Encounter for antineoplastic chemotherapy: Secondary | ICD-10-CM | POA: Diagnosis not present

## 2019-07-02 LAB — CBC WITH DIFFERENTIAL/PLATELET
Abs Immature Granulocytes: 0.67 10*3/uL — ABNORMAL HIGH (ref 0.00–0.07)
Basophils Absolute: 0.1 10*3/uL (ref 0.0–0.1)
Basophils Relative: 1 %
Eosinophils Absolute: 0 10*3/uL (ref 0.0–0.5)
Eosinophils Relative: 0 %
HCT: 27.8 % — ABNORMAL LOW (ref 39.0–52.0)
Hemoglobin: 8.6 g/dL — ABNORMAL LOW (ref 13.0–17.0)
Immature Granulocytes: 11 %
Lymphocytes Relative: 13 %
Lymphs Abs: 0.8 10*3/uL (ref 0.7–4.0)
MCH: 27.7 pg (ref 26.0–34.0)
MCHC: 30.9 g/dL (ref 30.0–36.0)
MCV: 89.4 fL (ref 80.0–100.0)
Monocytes Absolute: 0.9 10*3/uL (ref 0.1–1.0)
Monocytes Relative: 15 %
Neutro Abs: 3.5 10*3/uL (ref 1.7–7.7)
Neutrophils Relative %: 60 %
Platelets: 277 10*3/uL (ref 150–400)
RBC: 3.11 MIL/uL — ABNORMAL LOW (ref 4.22–5.81)
RDW: 16.5 % — ABNORMAL HIGH (ref 11.5–15.5)
Smear Review: ADEQUATE
WBC: 5.9 10*3/uL (ref 4.0–10.5)
nRBC: 1 % — ABNORMAL HIGH (ref 0.0–0.2)

## 2019-07-02 LAB — COMPREHENSIVE METABOLIC PANEL
ALT: 311 U/L — ABNORMAL HIGH (ref 0–44)
AST: 141 U/L — ABNORMAL HIGH (ref 15–41)
Albumin: 3 g/dL — ABNORMAL LOW (ref 3.5–5.0)
Alkaline Phosphatase: 82 U/L (ref 38–126)
Anion gap: 10 (ref 5–15)
BUN: 20 mg/dL (ref 8–23)
CO2: 21 mmol/L — ABNORMAL LOW (ref 22–32)
Calcium: 9 mg/dL (ref 8.9–10.3)
Chloride: 105 mmol/L (ref 98–111)
Creatinine, Ser: 1.2 mg/dL (ref 0.61–1.24)
GFR calc Af Amer: >60 mL/min (ref >60)
GFR calc non Af Amer: >60 mL/min (ref >60)
Glucose, Bld: 197 mg/dL — ABNORMAL HIGH (ref 70–99)
Potassium: 4.4 mmol/L (ref 3.5–5.1)
Sodium: 136 mmol/L (ref 135–145)
Total Bilirubin: 0.6 mg/dL (ref 0.3–1.2)
Total Protein: 6.7 g/dL (ref 6.5–8.1)

## 2019-07-02 MED ORDER — OXYCODONE HCL 5 MG PO TABS: 5.0000 mg | ORAL_TABLET | Freq: Three times a day (TID) | ORAL | 0 refills | Status: DC | PRN

## 2019-07-02 MED ORDER — SODIUM CHLORIDE 0.9% FLUSH
10.0000 mL | Freq: Once | INTRAVENOUS | Status: AC
  Administered 2019-07-02: 10 mL via INTRAVENOUS
  Filled 2019-07-02: qty 10

## 2019-07-02 MED ORDER — HEPARIN SOD (PORK) LOCK FLUSH 100 UNIT/ML IV SOLN
500.0000 [IU] | Freq: Once | INTRAVENOUS | Status: AC
  Administered 2019-07-02: 500 [IU] via INTRAVENOUS

## 2019-07-02 NOTE — Progress Notes (Signed)
Symptom Management Consult note [facility]  [phone] [phone]  Patient Care Team: Paulene Floor as PCP - General (Physician Assistant) Clent Jacks, RN as Oncology Nurse Navigator   Name of the patient: Craig [name]  BJ:[id number]  June 17, 1946   Date of visit: 07/02/2019   Diagnosis-pancreatic cancer  Chief complaint/ Reason for visit-follow-up-reevaluation of abnormal labs  Heme/Onc history:  Oncology History  Pancreatic cancer metastasized to liver ([place])  06/01/2019 Initial Diagnosis   Pancreatic cancer metastasized to liver [facility])   06/07/2019 -  Chemotherapy   The patient had PACLitaxel-protein bound (ABRAXANE) chemo infusion 250 mg, 125 mg/m2 = 250 mg (100 % of original dose 125 mg/m2), Intravenous,  Once, 1 of 6 cycles Dose modification: 100 mg/m2 (original dose 125 mg/m2, Cycle 1, Reason: Provider Judgment), 125 mg/m2 (original dose 125 mg/m2, Cycle 1, Reason: Provider Judgment) Administration: 250 mg (06/07/2019), 250 mg (06/14/2019), 250 mg (06/21/2019) gemcitabine (GEMZAR) 2,000 mg in sodium chloride 0.9 % 250 mL chemo infusion, 2,090 mg, Intravenous,  Once, 1 of 6 cycles Administration: 2,000 mg (06/07/2019), 2,000 mg (06/14/2019)  for chemotherapy treatment.      Interval history-Patient presents to [facility] today for follow-up of abnormal lab, diarrhea and dehydration after first cycle of chemotherapy with gemcitabine and Abraxane.  He was evaluated twice last week in our clinic on 06/26/2019 and 06/28/2019 and received IV fluids.  His first chemotherapy was on 06/21/2019.  He was instructed to continue Lomotil and Imodium.  He was taken off 2 of his 4 blood pressure medicines due to orthostatic hypotension.  He was given a prescription for Magic mouthwash for oral lesions.  Today, patient states he is much improved.  His diarrhea has resolved.  He denies any dizziness or weakness.  He reports he is eating well.  He ate  2 helpings of spaghetti yesterday.  He is drinking more fluids.  Admits to intermittent epigastric pain that he noticed yesterday.  He is not taking anything for the pain because he was told to stop Tylenol.  Pain resolves on its own.  He also admits to nosebleeds that began last week.  Has some sinus congestion and nasal "stuffiness".  He denies any fevers, chest pain, nausea, vomiting, constipation or diarrhea.  He has no urinary complaints.  ECOG FS:1 - Symptomatic but completely ambulatory  Review of systems- Review of Systems  HENT: Positive for congestion.   Gastrointestinal: Positive for abdominal pain.  Psychiatric/Behavioral: The patient is nervous/anxious.      Current treatment-status post cycle 1 Abraxane and gemcitabine  Allergies  Allergen Reactions  . Lipitor [Atorvastatin] Other (See Comments)    MYALGIA  . Zetia [Ezetimibe] Other (See Comments)    MYALGIA  . Lisinopril Cough  . Protonix [Pantoprazole] Other (See Comments)    headache  . Spironolactone Other (See Comments)    Breast tenderness     Past Medical History:  Diagnosis Date  . Allergic rhinitis   . Cancer [facility])    new dx Pancreatic Cancer - Aug 2020  . CHF (congestive heart failure) ([place])   . Chronic cholecystitis   . Coronary artery disease   . DDD (degenerative disc disease), cervical   . Dehydration 06/21/2019  . Dyspnea   . Early cataract   . Erectile dysfunction   . GERD (gastroesophageal reflux disease)   . Gouty arthritis   . Headache    occasional migraines  . Hypercalcemia   . Hyperlipemia   .  Hypertension   . Palpitations   . Peripheral vascular disease ([place])   . 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 Spinal stenosis of cervical region   . Vitamin D deficiency      Past Surgical History:  Procedure Laterality Date  . BACK SURGERY  2018    fusion with screws  . CHOLECYSTECTOMY N/A 11/13/2018    Procedure: LAPAROSCOPIC CHOLECYSTECTOMY;  Surgeon: Vickie Epley, MD;  Location: [facility] ORS;  Service: General;  Laterality: N/A;  . COLONOSCOPY    . CORONARY ARTERY BYPASS GRAFT N/A 09/30/2017   Procedure: CORONARY ARTERY BYPASS GRAFTING times five using right and left Saphaneous vein harvested endoscopicly  and left internal mammary artery. (CABG),TEE;  Surgeon: Rexene Alberts, MD;  Location: [place];  Service: Open Heart Surgery;  Laterality: N/A;  . LEFT HEART CATH AND CORONARY ANGIOGRAPHY Left 09/06/2017   Procedure: LEFT HEART CATH AND CORONARY ANGIOGRAPHY;  Surgeon: Corey Skains, MD;  Location: [place] CV LAB;  Service: Cardiovascular;  Laterality: Left;  . percutaneous transluminal balloon angioplasty  01/2010   of left lower extremity  . PORTA CATH INSERTION N/A 06/06/2019   Procedure: PORTA CATH INSERTION;  Surgeon: Katha Cabal, MD;  Location: [place] CV LAB;  Service: Cardiovascular;  Laterality: N/A;  . TEE WITHOUT CARDIOVERSION N/A 09/30/2017   Procedure: TRANSESOPHAGEAL ECHOCARDIOGRAM (TEE);  Surgeon: Rexene Alberts, MD;  Location: [place];  Service: Open Heart Surgery;  Laterality: N/A;  . VASCULAR SURGERY Left 2010   left exernal iliac and superficial femoral artery PTCA and stenting    Social History   Socioeconomic History  . Marital status: Married    Spouse name: Enid Derry  . Number of children: 2  . Years of education: Not on file  . Highest education level: Not on file  Occupational History  . 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 Financial resource strain: Not hard at all  . Food insecurity    Worry: Never true    Inability: Never true  . Transportation needs    Medical: No    Non-medical: No  Tobacco Use  . Smoking status: Former Smoker    Packs/day: 0.75    Years: 50.00    Pack years: 37.50    Types: Cigarettes    Quit date: 09/27/2017    Years since quitting: 1.7  . Smokeless tobacco: Former Systems developer     Types: Chew  Substance and Sexual Activity  . Alcohol use: Not Currently    Comment: stopped drinking before cabg  . Drug use: Not Currently    Types: Marijuana    Comment: stopped smoking before CABG  . Sexual activity: Not Currently  Lifestyle  . Physical activity    Days per week: 5 days    Minutes per session: 150+ min  . Stress: Not at all  Relationships  . Social connections    Talks on phone: More than three times a week    Gets together: More than three times a week    Attends religious service: Never    Active member of club or organization: No    Attends meetings of clubs or organizations: Never    Relationship status: Married  . Intimate partner violence    Fear of current or ex partner: Not on file    Emotionally abused: Not on file  Physically abused: Not on file    Forced sexual activity: Not on file  Other Topics Concern  . Not on file  Social History Narrative  . Not on file    Family History  Problem Relation Age of Onset  . Brain cancer Mother   . Emphysema Father   . Cancer Brother      Current Outpatient Medications:  .  aspirin EC 81 MG tablet, Take 81 mg by mouth daily., Disp: , Rfl:  .  carvedilol (COREG) 6.25 MG tablet, Take 6.25 mg by mouth 2 (two) times daily with a meal., Disp: , Rfl: 3 .  diltiazem (CARDIZEM CD) 300 MG 24 hr capsule, Take 1 capsule (300 mg total) by mouth daily., Disp: 90 capsule, Rfl: 1 .  diphenoxylate-atropine (LOMOTIL) 2.5-0.025 MG tablet, Take 1 tablet by mouth 4 (four) times daily as needed for diarrhea or loose stools., Disp: 30 tablet, Rfl: 0 .  dorzolamide-timolol (COSOPT) 22.3-6.8 MG/ML ophthalmic solution, INSTILL ONE DROP INTO BOTH EYES TWICE DAILY, Disp: , Rfl:  .  ferrous sulfate 325 (65 FE) MG tablet, TAKE 1 TABLET BY MOUTH EVERY DAY WITH BREAKFAST, Disp: 90 tablet, Rfl: 1 .  gabapentin (NEURONTIN) 300 MG capsule, Take 2 capsules (600 mg total) by mouth 3 (three) times daily., Disp: 540 capsule, Rfl: 0 .   hydrochlorothiazide (HYDRODIURIL) 25 MG tablet, Take 1 tablet (25 mg total) by mouth daily., Disp: 90 tablet, Rfl: 3 .  latanoprost (XALATAN) 0.005 % ophthalmic solution, Place 1 drop into both eyes at bedtime. , Disp: , Rfl: 3 .  lidocaine-prilocaine (EMLA) cream, Apply to affected area once, Disp: 30 g, Rfl: 3 .  Multiple Vitamin (MULTIVITAMIN WITH MINERALS) TABS tablet, Take 1 tablet by mouth daily., Disp: , Rfl:  .  OMEGA-3 FATTY ACIDS PO, Take 1,200 mg by mouth daily. , Disp: , Rfl:  .  omeprazole (PRILOSEC) 40 MG capsule, TAKE 1 CAPSULE BY MOUTH EVERY DAY, Disp: 90 capsule, Rfl: 0 .  ondansetron (ZOFRAN) 8 MG tablet, Take 1 tablet (8 mg total) by mouth 2 (two) times daily as needed (Nausea or vomiting)., Disp: 30 tablet, Rfl: 1 .  prochlorperazine (COMPAZINE) 10 MG tablet, Take 1 tablet (10 mg total) by mouth every 6 (six) hours as needed (Nausea or vomiting)., Disp: 30 tablet, Rfl: 1 .  rosuvastatin (CRESTOR) 10 MG tablet, Take 1 tablet (10 mg total) by mouth daily., Disp: 90 tablet, Rfl: 0 .  sucralfate (CARAFATE) 1 g tablet, Take 1 tablet (1 g total) by mouth 4 (four) times daily -  with meals and at bedtime., Disp: 28 tablet, Rfl: 0 .  triamcinolone cream (KENALOG) 0.1 %, APPLY 1 APPLICATION TOPICALLY 2 (TWO) TIMES DAILY. TO EXTERNAL EAR, Disp: 30 g, Rfl: 0 .  triamcinolone ointment (KENALOG) 0.5 %, Apply 1 application topically 3 (three) times daily., Disp: 30 g, Rfl: 0 .  valsartan (DIOVAN) 160 MG tablet, Take 1 tablet (160 mg total) by mouth daily., Disp: 180 tablet, Rfl: 3 .  magic mouthwash w/lidocaine SOLN, Take 5 mLs by mouth 4 (four) times daily as needed for mouth pain. (Patient not taking: Reported on 07/02/2019), Disp: 480 mL, Rfl: 0 .  oxyCODONE (ROXICODONE) 5 MG immediate release tablet, Take 1 tablet (5 mg total) by mouth every 8 (eight) hours as needed for severe pain., Disp: 30 tablet, Rfl: 0  Physical exam:  Vitals:   07/02/19 1102  BP: 132/64  Pulse: 79  Resp: 18   Temp: 98 F (36.7 C)  TempSrc: Tympanic   Physical Exam Constitutional:      Appearance: Normal appearance.  Abdominal:     Tenderness: There is abdominal tenderness in the epigastric area.  Neurological:     Mental Status: He is alert.      CMP Latest Ref Rng & Units 07/02/2019  Glucose 70 - 99 mg/dL 197(H)  BUN 8 - 23 mg/dL 20  Creatinine 0.61 - 1.24 mg/dL 1.20  Sodium 135 - 145 mmol/L 136  Potassium 3.5 - 5.1 mmol/L 4.4  Chloride 98 - 111 mmol/L 105  CO2 22 - 32 mmol/L 21(L)  Calcium 8.9 - 10.3 mg/dL 9.0  Total Protein 6.5 - 8.1 g/dL 6.7  Total Bilirubin 0.3 - 1.2 mg/dL 0.6  Alkaline Phos 38 - 126 U/L 82  AST 15 - 41 U/L 141(H)  ALT 0 - 44 U/L 311(H)   CBC Latest Ref Rng & Units 07/02/2019  WBC 4.0 - 10.5 K/uL 5.9  Hemoglobin 13.0 - 17.0 g/dL 8.6(L)  Hematocrit 39.0 - 52.0 % 27.8(L)  Platelets 150 - 400 K/uL 277    No images are attached to the encounter.  Nm Pet Image Initial (pi) Skull Base To Thigh  Result Date: 06/04/2019 CLINICAL DATA:  Initial treatment strategy for stage IV pancreatic cancer with biopsy confirmed liver metastasis. EXAM: NUCLEAR MEDICINE PET SKULL BASE TO THIGH TECHNIQUE: 11.0 mCi F-18 FDG was injected intravenously. Full-ring PET imaging was performed from the skull base to thigh after the radiotracer. CT data was obtained and used for attenuation correction and anatomic localization. Fasting blood glucose: 123 mg/dl COMPARISON:  05/16/2019 CT abdomen/pelvis. 05/10/2019 screening chest CT. FINDINGS: Mediastinal blood pool activity: SUV max 3.2 Liver activity: SUV max NA NECK: No hypermetabolic lymph nodes in the neck. Incidental CT findings: none CHEST: Bilateral hypermetabolic hilar foci with max SUV 5.2 on the left and 3.5 on the right. No enlarged or hypermetabolic axillary or mediastinal lymph nodes. No hypermetabolic pulmonary findings. A few scattered solid right pulmonary nodules, largest 4 mm in the basilar right lower lobe (series 3/image  125), below PET resolution. The pleural based solid density 4.3 x 2.3 cm structure at the extreme medial left lung base (series 3/image 129) is non hypermetabolic. Incidental CT findings: Three-vessel coronary atherosclerosis status post CABG. Atherosclerotic nonaneurysmal thoracic aorta. Dilated main pulmonary artery (3.6 cm diameter). Intact sternotomy wires. ABDOMEN/PELVIS: Intensely hypermetabolic uncinate process pancreatic mass measuring approximately 4.2 x 2.6 cm with max SUV 14.4 (series 3/image 163). Enlarged hypermetabolic 1.4 cm portacaval node with max SUV 10.5 (series 3/image 155). Several small hypermetabolic aortocaval and left para-aortic lymph nodes, largest 1.0 cm in the aortocaval chain with max SUV 6.2 (series 3/image 171). Several (at least 5) hypermetabolic liver masses scattered throughout the liver, largest 3.5 cm in the segment 6 right liver lobe with max SUV 12.6 (series 3/image 152), 2.4 cm in the central segment 5 right liver lobe with max SUV 13.6 (series 3/image 147) and 2.3 cm in posterior right liver lobe adjacent to the IVC with max SUV 10.9 (series 3/image 147). No abnormal hypermetabolic activity within the adrenal glands or spleen. No hypermetabolic lymph nodes in the pelvis. Incidental CT findings: Cholecystectomy. Simple 4.6 cm lower right renal cyst. Simple 1.5 cm interpolar left renal cyst. Atherosclerotic nonaneurysmal abdominal aorta. SKELETON: Faint hypermetabolism in the medial right iliac bone with associated faint sclerosis with max SUV 3.3 (series 3/image 215). No additional hypermetabolic osseous foci. Incidental CT findings: none IMPRESSION: 1. Intensely hypermetabolic uncinate process pancreatic mass,  compatible with primary pancreatic adenocarcinoma. 2. Hypermetabolic portacaval, aortocaval and left para-aortic nodal metastases. Bilateral hilar nodal hypermetabolism is also compatible with nodal metastatic disease. 3. Several (at least 5) hypermetabolic liver  metastases scattered throughout the liver. 4. Equivocal faint hypermetabolism in the medial right iliac bone with associated faint sclerosis, osseous metastasis not excluded, although potentially degenerative. 5. Tiny scattered right pulmonary nodules, below PET resolution, recommend attention on follow-up chest CT in 3-6 months. 6. Chronic findings include: Aortic Atherosclerosis (ICD10-I70.0). Dilated main pulmonary artery, suggesting pulmonary arterial hypertension. Electronically Signed   By: Ilona Sorrel M.D.   On: 06/04/2019 13:56     Assessment and plan- Patient is a 73 y.o. male who presents to [facility] for follow-up of dehydration and diarrhea.  Pancreatic cancer with metastasis to the liver: Recent diagnosis.  Started on gemcitabine and Abraxane last given on 06/21/2019.  Diarrhea/dehydration: Essentially resolved.  Suspect chemo associated diarrhea.  Continue Lomotil and Imodium as needed.    Hypotension: Blood pressure has improved.  Continue to hold HCTZ and valsartan.  Continue Coreg and Cardizem.  Acute kidney injury: Resolved.  Transaminitis: AST 141 and ALT 311.  Continue to hold statin, Tylenol, avoid alcohol and continue to monitor.  Has known history of metastatic disease to his liver.  Neoplasm related pain: Epigastric region.  Previously used Tylenol but was instructed to hold due to elevated LFTs.  Discussed with [employer] and can try oxycodone 5 mg every 8 hours as needed for pain.  Mouth sores/mucositis: Continue Magic mouthwash as needed for mouth sores.  Nosebleeds: Platelet count ok. Unclear. Possible viral sinusitis. Continue to monitor. Can use saline nasal spray for lubrication.   Plan: Continue current scope of treatment. Continue to push fluids. Rx oxycodone 5 mg every 8 hours as needed for pain. Continue to hold Tylenol, statin, HCTZ and valsartan.   Disposition: RTC as scheduled for additional cycles of gemcitabine and Abraxane. Begin oxycodone 5 mg as  needed for pain.  Visit Diagnosis 1. Pancreatic cancer metastasized to liver [facility])     Patient expressed understanding and was in agreement with this plan. He also understands that He can call clinic at any time with any questions, concerns, or complaints.   Greater than 50% was spent in counseling and coordination of care with this patient including but not limited to discussion of the relevant topics above (See A&P) including, but not limited to diagnosis and management of acute and chronic medical conditions.   Thank you for allowing me to participate in the care of this very pleasant patient.    Jacquelin Hawking, NP [place] at [facility] Cell - [phone] Pager- [phone] 07/02/2019 1:31 PM

## 2019-07-04 ENCOUNTER — Encounter: Payer: Self-pay | Admitting: Oncology

## 2019-07-04 ENCOUNTER — Other Ambulatory Visit: Payer: Self-pay

## 2019-07-04 NOTE — Progress Notes (Signed)
Patient reports feeling much better after IVF on Monday.

## 2019-07-05 ENCOUNTER — Inpatient Hospital Stay (HOSPITAL_BASED_OUTPATIENT_CLINIC_OR_DEPARTMENT_OTHER): Payer: Medicare Other | Admitting: Oncology

## 2019-07-05 ENCOUNTER — Inpatient Hospital Stay: Payer: Medicare Other

## 2019-07-05 ENCOUNTER — Other Ambulatory Visit: Payer: Self-pay

## 2019-07-05 ENCOUNTER — Inpatient Hospital Stay (HOSPITAL_BASED_OUTPATIENT_CLINIC_OR_DEPARTMENT_OTHER): Payer: Medicare Other | Admitting: Hospice and Palliative Medicine

## 2019-07-05 VITALS — BP 136/67 | HR 84 | Temp 97.1°F | Resp 18 | Wt 181.4 lb

## 2019-07-05 DIAGNOSIS — C787 Secondary malignant neoplasm of liver and intrahepatic bile duct: Secondary | ICD-10-CM

## 2019-07-05 DIAGNOSIS — Z5111 Encounter for antineoplastic chemotherapy: Secondary | ICD-10-CM

## 2019-07-05 DIAGNOSIS — G629 Polyneuropathy, unspecified: Secondary | ICD-10-CM | POA: Diagnosis not present

## 2019-07-05 DIAGNOSIS — Z515 Encounter for palliative care: Secondary | ICD-10-CM | POA: Diagnosis not present

## 2019-07-05 DIAGNOSIS — R945 Abnormal results of liver function studies: Secondary | ICD-10-CM

## 2019-07-05 DIAGNOSIS — C259 Malignant neoplasm of pancreas, unspecified: Secondary | ICD-10-CM

## 2019-07-05 DIAGNOSIS — Z95828 Presence of other vascular implants and grafts: Secondary | ICD-10-CM

## 2019-07-05 DIAGNOSIS — R7989 Other specified abnormal findings of blood chemistry: Secondary | ICD-10-CM

## 2019-07-05 DIAGNOSIS — Z7189 Other specified counseling: Secondary | ICD-10-CM

## 2019-07-05 LAB — COMPREHENSIVE METABOLIC PANEL
ALT: 237 U/L — ABNORMAL HIGH (ref 0–44)
AST: 83 U/L — ABNORMAL HIGH (ref 15–41)
Albumin: 3.2 g/dL — ABNORMAL LOW (ref 3.5–5.0)
Alkaline Phosphatase: 70 U/L (ref 38–126)
Anion gap: 11 (ref 5–15)
BUN: 20 mg/dL (ref 8–23)
CO2: 21 mmol/L — ABNORMAL LOW (ref 22–32)
Calcium: 9.1 mg/dL (ref 8.9–10.3)
Chloride: 107 mmol/L (ref 98–111)
Creatinine, Ser: 1.08 mg/dL (ref 0.61–1.24)
GFR calc Af Amer: >60 mL/min (ref >60)
GFR calc non Af Amer: >60 mL/min (ref >60)
Glucose, Bld: 183 mg/dL — ABNORMAL HIGH (ref 70–99)
Potassium: 4.6 mmol/L (ref 3.5–5.1)
Sodium: 139 mmol/L (ref 135–145)
Total Bilirubin: 0.5 mg/dL (ref 0.3–1.2)
Total Protein: 6.8 g/dL (ref 6.5–8.1)

## 2019-07-05 LAB — CBC WITH DIFFERENTIAL/PLATELET
Abs Immature Granulocytes: 0.22 10*3/uL — ABNORMAL HIGH (ref 0.00–0.07)
Basophils Absolute: 0.1 10*3/uL (ref 0.0–0.1)
Basophils Relative: 1 %
Eosinophils Absolute: 0 10*3/uL (ref 0.0–0.5)
Eosinophils Relative: 0 %
HCT: 27.3 % — ABNORMAL LOW (ref 39.0–52.0)
Hemoglobin: 8.3 g/dL — ABNORMAL LOW (ref 13.0–17.0)
Immature Granulocytes: 5 %
Lymphocytes Relative: 14 %
Lymphs Abs: 0.7 10*3/uL (ref 0.7–4.0)
MCH: 27.7 pg (ref 26.0–34.0)
MCHC: 30.4 g/dL (ref 30.0–36.0)
MCV: 91 fL (ref 80.0–100.0)
Monocytes Absolute: 0.6 10*3/uL (ref 0.1–1.0)
Monocytes Relative: 11 %
Neutro Abs: 3.3 10*3/uL (ref 1.7–7.7)
Neutrophils Relative %: 69 %
Platelets: 387 10*3/uL (ref 150–400)
RBC: 3 MIL/uL — ABNORMAL LOW (ref 4.22–5.81)
RDW: 17.1 % — ABNORMAL HIGH (ref 11.5–15.5)
WBC: 4.8 10*3/uL (ref 4.0–10.5)
nRBC: 0.4 % — ABNORMAL HIGH (ref 0.0–0.2)

## 2019-07-05 MED ORDER — SODIUM CHLORIDE 0.9 % IV SOLN
Freq: Once | INTRAVENOUS | Status: AC
  Administered 2019-07-05: 10:00:00 via INTRAVENOUS
  Filled 2019-07-05: qty 250

## 2019-07-05 MED ORDER — HEPARIN SOD (PORK) LOCK FLUSH 100 UNIT/ML IV SOLN
500.0000 [IU] | Freq: Once | INTRAVENOUS | Status: AC
  Administered 2019-07-05: 500 [IU] via INTRAVENOUS
  Filled 2019-07-05: qty 5

## 2019-07-05 MED ORDER — PACLITAXEL PROTEIN-BOUND CHEMO INJECTION 100 MG
100.0000 mg/m2 | Freq: Once | INTRAVENOUS | Status: AC
  Administered 2019-07-05: 200 mg via INTRAVENOUS
  Filled 2019-07-05: qty 40

## 2019-07-05 MED ORDER — SODIUM CHLORIDE 0.9 % IV SOLN
2000.0000 mg | Freq: Once | INTRAVENOUS | Status: AC
  Administered 2019-07-05: 2000 mg via INTRAVENOUS
  Filled 2019-07-05: qty 52.6

## 2019-07-05 MED ORDER — SODIUM CHLORIDE 0.9% FLUSH
10.0000 mL | INTRAVENOUS | Status: DC | PRN
  Administered 2019-07-05: 10 mL via INTRAVENOUS
  Filled 2019-07-05: qty 10

## 2019-07-05 MED ORDER — PROCHLORPERAZINE MALEATE 10 MG PO TABS
10.0000 mg | ORAL_TABLET | Freq: Once | ORAL | Status: AC
  Administered 2019-07-05: 10 mg via ORAL
  Filled 2019-07-05: qty 1

## 2019-07-05 NOTE — Progress Notes (Signed)
Labs reviewed, proceed with treatment today per Dr. Tasia Catchings

## 2019-07-05 NOTE — Progress Notes (Signed)
Hematology/Oncology  Follow up note [facility] Telephone:([phone] Fax:([phone]) [phone]   Patient Care Team: Paulene Floor as PCP - General (Physician Assistant) Clent Jacks, RN as Oncology Nurse Navigator  REFERRING PROVIDER: Trinna Post, PA-C  CHIEF COMPLAINTS/REASON FOR VISIT:  Evaluation of abnormal CT  HISTORY OF PRESENTING ILLNESS:   Craig [name] is a  73 y.o.  male with PMH listed below, including CABG x5, PVD, hypertension, former tobacco abuse, former alcohol abuse, iron deficiency anemia, was seen in consultation at the request of  Terrilee Croak, Adriana M, PA-C  for evaluation of abnormal CT Patient recently presented to primary care provider complaining for abdominal pain radiating to his back for about 10 days.  Patient uses Tylenol as needed for pain. Work-up showed elevated amylase, lipase, mild anemia with hemoglobin of 11.3 Acute hepatitis panel negative. 05/16/2019 CT abdomen pelvis with contrast showed poorly marginated heterogeneous hypodense 2.9 cm pancreatic mass at the uncinate process, worrisome for primary pancreatic cancer.  No biliary or pancreatic duct dilation. Several ill defined hypodense liver masses scattered throughout the liver, largest 3.1 cm in the segment 6 right liver lobe. Nonspecific portacaval and aortocaval adenopathy. Extreme medial left lung base 4.4 cm pleural-based mass probably a pleural metastasis.  Additional tiny pulmonary nodules scattered at the right lung base are indeterminate. Mild prostate or megaly  Patient was referred to heme-onc for further evaluation. Patient reports that his abdominal pain was 10 out of 10 a few days ago, he uses Tylenol as needed. Today his pain is getting better, 2 out of 10.  Denies any nausea, vomiting, diarrhea, fever or chills. Married, lives with wife.  Appetite is good.  He weighs 195 pounds today at the clinic. His weight was 204 pounds back in May 2020.   He had a history of 37.5-pack-year smoking history quitting 2018. No longer drinking alcohol.  # ultrasound-guided liver biopsy on 05/21/2019.  Pathology showed fragments of benign hepatic parenchyma showing minimal macro vascular steatosis, otherwise no significant histo pathologic change. Single core of renal tissue showing approximately 25% glomerulosclerosis  # #History of CAD, status post CABG x5.  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  # Omniseq test showed PD-L1 50% TPS, TMB high, negative for BRCA1/2 or NTRK fusion.  MSI could not be completed.   INTERVAL HISTORY Craig [name] is a 73 y.o. male who has above history reviewed by me today presents for follow up for evaluation prior to first cycle of chemotherapy. Problems and complaints are listed below: Patient was seen by symptom management NP last week due to dehydration and diarrhea.  He was given supportive care with IVF and was advise to use anti diarrhea medications. Lomotil and Imodium.  His HCTZ and Valsatan were held. Continue Coreg and Cardizem.  Today he reports Diarrhea has improved.  No more loose bowel movements.  He feels a lot better today as well. He denies any pain today.  No nausea, fever, chills. #He has bilateral lower extremity/bottom of feet numbness and tingling, constant.  Symptoms are worst at night. He is not sure if he is taking gabapentin or not.  Review of Systems  Constitutional: Positive for fatigue and unexpected weight change. Negative for appetite change, chills and fever.  HENT:   Negative for hearing loss and voice change.   Eyes: Negative for eye problems and icterus.  Respiratory: Negative for  chest tightness, cough and shortness of breath.   Cardiovascular: Negative for chest pain and leg swelling.  Gastrointestinal: Negative for abdominal distention and  abdominal pain.  Endocrine: Negative for hot flashes.  Genitourinary: Negative for difficulty urinating, dysuria and frequency.   Musculoskeletal: Negative for arthralgias.  Skin: Negative for itching and rash.  Neurological: Negative for light-headedness and numbness.  Hematological: Negative for adenopathy. Does not bruise/bleed easily.  Psychiatric/Behavioral: Negative for confusion.    MEDICAL HISTORY:  Past Medical History:  Diagnosis Date  . Allergic rhinitis   . Cancer [facility])    new dx Pancreatic Cancer - Aug 2020  . CHF (congestive heart failure) ([place])   . Chronic cholecystitis   . Coronary artery disease   . DDD (degenerative disc disease), cervical   . Dehydration 06/21/2019  . Dyspnea   . Early cataract   . Erectile dysfunction   . GERD (gastroesophageal reflux disease)   . Gouty arthritis   . Headache    occasional migraines  . Hypercalcemia   . Hyperlipemia   . Hypertension   . Palpitations   . Peripheral vascular disease ([place])   . 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   LIMA to LAD SVG to [place] SVG SEQUENTIALLY to OM1 and OM2 SVG to ACUTE MARGINAL  . Spinal stenosis of cervical region   . Vitamin D deficiency     SURGICAL HISTORY: Past Surgical History:  Procedure Laterality Date  . BACK SURGERY  2018    fusion with screws  . CHOLECYSTECTOMY N/A 11/13/2018   Procedure: LAPAROSCOPIC CHOLECYSTECTOMY;  Surgeon: Vickie Epley, MD;  Location: [facility] ORS;  Service: General;  Laterality: N/A;  . COLONOSCOPY    . CORONARY ARTERY BYPASS GRAFT N/A 09/30/2017   Procedure: CORONARY ARTERY BYPASS GRAFTING times five using right and left Saphaneous vein harvested endoscopicly  and left internal mammary artery. (CABG),TEE;  Surgeon: Rexene Alberts, MD;  Location: [place];  Service: Open Heart Surgery;  Laterality: N/A;  . LEFT HEART CATH AND CORONARY ANGIOGRAPHY Left 09/06/2017   Procedure: LEFT HEART CATH AND CORONARY ANGIOGRAPHY;  Surgeon: Corey Skains, MD;   Location: [place] [facility];  Service: Cardiovascular;  Laterality: Left;  . percutaneous transluminal balloon angioplasty  01/2010   of left lower extremity  . PORTA CATH INSERTION N/A 06/06/2019   Procedure: PORTA CATH INSERTION;  Surgeon: Katha Cabal, MD;  Location: [place] [facility];  Service: Cardiovascular;  Laterality: N/A;  . TEE WITHOUT CARDIOVERSION N/A 09/30/2017   Procedure: TRANSESOPHAGEAL ECHOCARDIOGRAM (TEE);  Surgeon: Rexene Alberts, MD;  Location: [place];  Service: Open Heart Surgery;  Laterality: N/A;  . VASCULAR SURGERY Left 2010   left exernal iliac and superficial femoral artery PTCA and stenting    SOCIAL HISTORY: Social History   Socioeconomic History  . Marital status: Married    Spouse name: Enid Derry  . Number of children: 2  . Years of education: Not on file  . Highest education level: Not on file  Occupational History  . 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 Financial resource strain: Not hard at all  . Food insecurity    Worry: Never true    Inability: Never true  . Transportation needs    Medical: No    Non-medical: No  Tobacco Use  . Smoking status: Former Smoker    Packs/day: 0.75    Years: 50.00    Pack years: 37.50    Types:  Cigarettes    Quit date: 09/27/2017    Years since quitting: 1.7  . Smokeless tobacco: Former Systems developer    Types: Chew  Substance and Sexual Activity  . Alcohol use: Not Currently    Comment: stopped drinking before cabg  . Drug use: Not Currently    Types: Marijuana    Comment: stopped smoking before CABG  . Sexual activity: Not Currently  Lifestyle  . Physical activity    Days per week: 5 days    Minutes per session: 150+ min  . Stress: Not at all  Relationships  . Social connections    Talks on phone: More than three times a week    Gets together: More than three times a week    Attends religious service: Never    Active member of club or organization: No    Attends  meetings of clubs or organizations: Never    Relationship status: Married  . Intimate partner violence    Fear of current or ex partner: Not on file    Emotionally abused: Not on file    Physically abused: Not on file    Forced sexual activity: Not on file  Other Topics Concern  . Not on file  Social History Narrative  . Not on file    FAMILY HISTORY: Family History  Problem Relation Age of Onset  . Brain cancer Mother   . Emphysema Father   . Cancer Brother     ALLERGIES:  is allergic to lipitor [atorvastatin]; zetia [ezetimibe]; lisinopril; protonix [pantoprazole]; and spironolactone.  MEDICATIONS:  Current Outpatient Medications  Medication Sig Dispense Refill  . aspirin EC 81 MG tablet Take 81 mg by mouth daily.    . carvedilol (COREG) 6.25 MG tablet Take 6.25 mg by mouth 2 (two) times daily with a meal.  3  . diltiazem (CARDIZEM CD) 300 MG 24 hr capsule Take 1 capsule (300 mg total) by mouth daily. 90 capsule 1  . diphenoxylate-atropine (LOMOTIL) 2.5-0.025 MG tablet Take 1 tablet by mouth 4 (four) times daily as needed for diarrhea or loose stools. 30 tablet 0  . dorzolamide-timolol (COSOPT) 22.3-6.8 MG/ML ophthalmic solution INSTILL ONE DROP INTO BOTH EYES TWICE DAILY    . gabapentin (NEURONTIN) 300 MG capsule Take 2 capsules (600 mg total) by mouth 3 (three) times daily. 540 capsule 0  . latanoprost (XALATAN) 0.005 % ophthalmic solution Place 1 drop into both eyes at bedtime.   3  . lidocaine-prilocaine (EMLA) cream Apply to affected area once 30 g 3  . magic mouthwash w/lidocaine SOLN Take 5 mLs by mouth 4 (four) times daily as needed for mouth pain. 480 mL 0  . Multiple Vitamin (MULTIVITAMIN WITH MINERALS) TABS tablet Take 1 tablet by mouth daily.    . OMEGA-3 FATTY ACIDS PO Take 1,200 mg by mouth daily.     [name] Kitchen omeprazole (PRILOSEC) 40 MG capsule TAKE 1 CAPSULE BY MOUTH EVERY DAY 90 capsule 0  . ondansetron (ZOFRAN) 8 MG tablet Take 1 tablet (8 mg total) by mouth 2 (two)  times daily as needed (Nausea or vomiting). 30 tablet 1  . oxyCODONE (ROXICODONE) 5 MG immediate release tablet Take 1 tablet (5 mg total) by mouth every 8 (eight) hours as needed for severe pain. 30 tablet 0  . prochlorperazine (COMPAZINE) 10 MG tablet Take 1 tablet (10 mg total) by mouth every 6 (six) hours as needed (Nausea or vomiting). 30 tablet 1  . triamcinolone cream (KENALOG) 0.1 % APPLY 1 APPLICATION TOPICALLY  2 (TWO) TIMES DAILY. TO EXTERNAL EAR 30 g 0  . triamcinolone ointment (KENALOG) 0.5 % Apply 1 application topically 3 (three) times daily. 30 g 0  . ferrous sulfate 325 (65 FE) MG tablet TAKE 1 TABLET BY MOUTH EVERY DAY WITH BREAKFAST (Patient not taking: Reported on 07/04/2019) 90 tablet 1  . hydrochlorothiazide (HYDRODIURIL) 25 MG tablet Take 1 tablet (25 mg total) by mouth daily. (Patient not taking: Reported on 07/04/2019) 90 tablet 3  . rosuvastatin (CRESTOR) 10 MG tablet Take 1 tablet (10 mg total) by mouth daily. 90 tablet 0  . sucralfate (CARAFATE) 1 g tablet Take 1 tablet (1 g total) by mouth 4 (four) times daily -  with meals and at bedtime. (Patient not taking: Reported on 07/04/2019) 28 tablet 0  . valsartan (DIOVAN) 160 MG tablet Take 1 tablet (160 mg total) by mouth daily. (Patient not taking: Reported on 07/04/2019) 180 tablet 3   No current facility-administered medications for this visit.    Facility-Administered Medications Ordered in Other Visits  Medication Dose Route Frequency Provider Last Rate Last Dose  . sodium chloride flush (NS) 0.9 % injection 10 mL  10 mL Intravenous PRN Earlie Server, MD   10 mL at 07/05/19 0837     PHYSICAL EXAMINATION: ECOG PERFORMANCE STATUS: 1 - Symptomatic but completely ambulatory Vitals:   07/04/19 1702  BP: 136/67  Pulse: 84  Resp: 18  Temp: (!) 97.1 F (36.2 C)   Filed Weights   07/04/19 1702  Weight: 181 lb 6.4 oz (82.3 kg)    Physical Exam Constitutional:      General: He is not in acute distress.    Comments: Walk  in   HENT:     Head: Normocephalic and atraumatic.  Eyes:     General: No scleral icterus.    Pupils: Pupils are equal, round, and reactive to light.  Neck:     Musculoskeletal: Normal range of motion and neck supple.  Cardiovascular:     Rate and Rhythm: Normal rate and regular rhythm.     Heart sounds: Normal heart sounds.  Pulmonary:     Effort: Pulmonary effort is normal. No respiratory distress.     Breath sounds: No wheezing.  Abdominal:     General: Bowel sounds are normal. There is no distension.     Palpations: Abdomen is soft. There is no mass.     Tenderness: There is no abdominal tenderness.  Musculoskeletal: Normal range of motion.        General: No deformity.  Skin:    General: Skin is warm and dry.     Findings: No erythema or rash.  Neurological:     General: No focal deficit present.     Mental Status: He is alert and oriented to person, place, and time.     Cranial Nerves: No cranial nerve deficit.     Coordination: Coordination normal.  Psychiatric:        Behavior: Behavior normal.        Thought Content: Thought content normal.     LABORATORY DATA:  I have reviewed the data as listed Lab Results  Component Value Date   WBC 4.8 07/05/2019   HGB 8.3 (L) 07/05/2019   HCT 27.3 (L) 07/05/2019   MCV 91.0 07/05/2019   PLT 387 07/05/2019   Recent Labs    06/28/19 1050 07/02/19 1052 07/05/19 0837  NA 135 136 139  K 4.7 4.4 4.6  CL 106 105 107  CO2  18* 21* 21*  GLUCOSE 186* 197* 183*  BUN 41* 20 20  CREATININE 1.71* 1.20 1.08  CALCIUM 9.3 9.0 9.1  GFRNONAA 39* >60 >60  GFRAA 45* >60 >60  PROT 7.6 6.7 6.8  ALBUMIN 3.3* 3.0* 3.2*  AST 223* 141* 83*  ALT 368* 311* 237*  ALKPHOS 123 82 70  BILITOT 0.7 0.6 0.5   Iron/TIBC/Ferritin/ %Sat    Component Value Date/Time   IRON 119 04/27/2019 0940   TIBC 363 04/27/2019 0940   FERRITIN 58 04/27/2019 0940   IRONPCTSAT 33 04/27/2019 0940      RADIOGRAPHIC STUDIES: I have personally reviewed  the radiological images as listed and agreed with the findings in the report.  No results found.    ASSESSMENT & PLAN:  1. Pancreatic cancer metastasized to liver ([place])   2. Abnormal LFTs   3. Encounter for antineoplastic chemotherapy   4. Neuropathy    #Metastatic pancreatic cancer, He tolerated chemotherapy with moderate difficulties. Labs reviewed and discussed with patient. Counts acceptable to proceed with cycle 2-day 1 gemcitabine and Abraxane.  #Pre-existing neuropathy, gabapentin 600 mg 3 times daily were included on his medication list though he is not sure if he is taking it or not. He reports that his bilateral lower extremity/bottom feet neuropathy is getting worse, constant. I suggest patient to make sure that he is taking gabapentin 600 mg 3 times daily. I will decrease Abraxane dosage due to the worsening of neuropathy.  #Dehydration/diarrhea, symptom has improved.  Encourage oral hydration. #Transaminitis, trending down.  Okay to proceed with treatments. His blood pressure is normal today.  Continue hold HCTZ and Vasaltan.  Labs reviewed and discussed with patient. Lab cycle 2-day 1 cycle 1 day 15 gemcitabine and Abraxane treatment today.   Tolerated day 1 and day 8 treatments well.  #Normocytic anemia, hemoglobin 8.3, chemotherapy-induced.  Has a history of iron deficiency. Continue monitor.  Continue oral iron supplementation.[name] Kitchen  #Follow-up with palliative care service Will need to refer to genetic testing  All questions were answered. The patient knows to call the clinic with any problems questions or concerns.  cc Trinna Post, PA-C   Return of visit: [place], MD, PhD Hematology Oncology [place] at [facility] 07/05/2019

## 2019-07-06 LAB — CANCER ANTIGEN 19-9: CA 19-9: 2115 U/mL — ABNORMAL HIGH (ref 0–35)

## 2019-07-06 NOTE — Progress Notes (Signed)
[facility]  [phone] [phone]   Name: Craig [name] Date: 07/06/2019 MRN: [id number]  DOB: [birth year]  Patient Care Team: Paulene Floor as PCP - General (Physician Assistant) Clent Jacks, RN as Oncology Nurse Navigator    REASON FOR CONSULTATION: Palliative Care consult requested for this 73 y.o. 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  Patient is being initiated on chemotherapy with gemcitabine and Abraxane.  He was referred to palliative care to help address goals and manage ongoing symptoms.   SOCIAL HISTORY:     reports that he quit smoking about 21 months ago. His smoking use included cigarettes. He has a 37.50 pack-year smoking history. He has quit using smokeless tobacco.  His smokeless tobacco use included chew. He reports previous alcohol use. He reports previous drug use. Drug: Marijuana.   Patient is married and lives at home with his wife.  He has a son and a daughter but reports that he is estranged from both.  He had another daughter who is now deceased.  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  Note that patient is illiterate.  He also says that his wife often will not answer the phone and recommends that we call and leave a message on the answering machine and then call her right back.  ADVANCE DIRECTIVES:  Does not have  CODE STATUS:   PAST MEDICAL HISTORY: Past Medical History:  Diagnosis Date  . Allergic rhinitis   . Cancer [facility])    new dx Pancreatic Cancer - Aug 2020  . CHF (congestive heart failure) ([place])   . Chronic cholecystitis   . Coronary artery disease   . DDD (degenerative disc disease), cervical   . Dehydration 06/21/2019  . Dyspnea   . Early cataract   . Erectile  dysfunction   . GERD (gastroesophageal reflux disease)   . Gouty arthritis   . Headache    occasional migraines  . Hypercalcemia   . Hyperlipemia   . Hypertension   . Palpitations   . Peripheral vascular disease ([place])   . 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   LIMA to LAD SVG to [place] SVG SEQUENTIALLY to OM1 and OM2 SVG to ACUTE MARGINAL  . Spinal stenosis of cervical region   . Vitamin D deficiency     PAST SURGICAL HISTORY:  Past Surgical History:  Procedure Laterality Date  . BACK SURGERY  2018    fusion with screws  . CHOLECYSTECTOMY N/A 11/13/2018   Procedure: LAPAROSCOPIC CHOLECYSTECTOMY;  Surgeon: Vickie Epley, MD;  Location: [facility];  Service: General;  Laterality: N/A;  . COLONOSCOPY    . CORONARY ARTERY BYPASS GRAFT N/A 09/30/2017   Procedure: CORONARY ARTERY BYPASS GRAFTING times five using right and left Saphaneous vein harvested endoscopicly  and left internal mammary artery. (CABG),TEE;  Surgeon: Rexene Alberts, MD;  Location: [place];  Service: Open Heart Surgery;  Laterality: N/A;  . LEFT HEART CATH AND CORONARY ANGIOGRAPHY Left 09/06/2017   Procedure: LEFT HEART CATH AND CORONARY ANGIOGRAPHY;  Surgeon: Corey Skains, MD;  Location: [place] [facility];  Service: Cardiovascular;  Laterality: Left;  . percutaneous transluminal balloon angioplasty  01/2010   of left lower extremity  . PORTA CATH INSERTION N/A 06/06/2019  Procedure: PORTA CATH INSERTION;  Surgeon: Katha Cabal, MD;  Location: [place] [facility];  Service: Cardiovascular;  Laterality: N/A;  . TEE WITHOUT CARDIOVERSION N/A 09/30/2017   Procedure: TRANSESOPHAGEAL ECHOCARDIOGRAM (TEE);  Surgeon: Rexene Alberts, MD;  Location: [facility];  Service: Open Heart Surgery;  Laterality: N/A;  . VASCULAR SURGERY Left 2010   left exernal iliac and superficial femoral artery PTCA and stenting    HEMATOLOGY/ONCOLOGY HISTORY:  Oncology History  Pancreatic cancer metastasized to liver ([place])   06/01/2019 Initial Diagnosis   Pancreatic cancer metastasized to liver ([place])   06/07/2019 -  Chemotherapy   The patient had PACLitaxel-protein bound (ABRAXANE) chemo infusion 250 mg, 125 mg/m2 = 250 mg (100 % of original dose 125 mg/m2), Intravenous,  Once, 2 of 6 cycles Dose modification: 100 mg/m2 (original dose 125 mg/m2, Cycle 1, Reason: Provider Judgment), 125 mg/m2 (original dose 125 mg/m2, Cycle 1, Reason: Provider Judgment), 100 mg/m2 (original dose 125 mg/m2, Cycle 5, Reason: Dose not tolerated, Comment: neuropathy) Administration: 250 mg (06/07/2019), 250 mg (06/14/2019), 250 mg (06/21/2019), 200 mg (07/05/2019) gemcitabine (GEMZAR) 2,000 mg in sodium chloride 0.9 % 250 mL chemo infusion, 2,090 mg, Intravenous,  Once, 2 of 6 cycles Administration: 2,000 mg (06/07/2019), 2,000 mg (06/14/2019), 2,000 mg (07/05/2019)  for chemotherapy treatment.      ALLERGIES:  is allergic to lipitor [atorvastatin]; zetia [ezetimibe]; lisinopril; protonix [pantoprazole]; and spironolactone.  MEDICATIONS:  Current Outpatient Medications  Medication Sig Dispense Refill  . aspirin EC 81 MG tablet Take 81 mg by mouth daily.    . carvedilol (COREG) 6.25 MG tablet Take 6.25 mg by mouth 2 (two) times daily with a meal.  3  . diltiazem (CARDIZEM CD) 300 MG 24 hr capsule Take 1 capsule (300 mg total) by mouth daily. 90 capsule 1  . diphenoxylate-atropine (LOMOTIL) 2.5-0.025 MG tablet Take 1 tablet by mouth 4 (four) times daily as needed for diarrhea or loose stools. 30 tablet 0  . dorzolamide-timolol (COSOPT) 22.3-6.8 MG/ML ophthalmic solution INSTILL ONE DROP INTO BOTH EYES TWICE DAILY    . ferrous sulfate 325 (65 FE) MG tablet TAKE 1 TABLET BY MOUTH EVERY DAY WITH BREAKFAST (Patient not taking: Reported on 07/04/2019) 90 tablet 1  . gabapentin (NEURONTIN) 300 MG capsule Take 2 capsules (600 mg total) by mouth 3 (three) times daily. 540 capsule 0  . hydrochlorothiazide (HYDRODIURIL) 25 MG tablet Take 1 tablet (25 mg  total) by mouth daily. (Patient not taking: Reported on 07/04/2019) 90 tablet 3  . latanoprost (XALATAN) 0.005 % ophthalmic solution Place 1 drop into both eyes at bedtime.   3  . lidocaine-prilocaine (EMLA) cream Apply to affected area once 30 g 3  . magic mouthwash w/lidocaine SOLN Take 5 mLs by mouth 4 (four) times daily as needed for mouth pain. 480 mL 0  . Multiple Vitamin (MULTIVITAMIN WITH MINERALS) TABS tablet Take 1 tablet by mouth daily.    . OMEGA-3 FATTY ACIDS PO Take 1,200 mg by mouth daily.     [name] Kitchen omeprazole (PRILOSEC) 40 MG capsule TAKE 1 CAPSULE BY MOUTH EVERY DAY 90 capsule 0  . ondansetron (ZOFRAN) 8 MG tablet Take 1 tablet (8 mg total) by mouth 2 (two) times daily as needed (Nausea or vomiting). 30 tablet 1  . oxyCODONE (ROXICODONE) 5 MG immediate release tablet Take 1 tablet (5 mg total) by mouth every 8 (eight) hours as needed for severe pain. 30 tablet 0  . prochlorperazine (COMPAZINE) 10 MG tablet Take 1 tablet (  10 mg total) by mouth every 6 (six) hours as needed (Nausea or vomiting). 30 tablet 1  . rosuvastatin (CRESTOR) 10 MG tablet Take 1 tablet (10 mg total) by mouth daily. 90 tablet 0  . sucralfate (CARAFATE) 1 g tablet Take 1 tablet (1 g total) by mouth 4 (four) times daily -  with meals and at bedtime. (Patient not taking: Reported on 07/04/2019) 28 tablet 0  . triamcinolone cream (KENALOG) 0.1 % APPLY 1 APPLICATION TOPICALLY 2 (TWO) TIMES DAILY. TO EXTERNAL EAR 30 g 0  . triamcinolone ointment (KENALOG) 0.5 % Apply 1 application topically 3 (three) times daily. 30 g 0  . valsartan (DIOVAN) 160 MG tablet Take 1 tablet (160 mg total) by mouth daily. (Patient not taking: Reported on 07/04/2019) 180 tablet 3   No current facility-administered medications for this visit.     VITAL SIGNS: There were no vitals taken for this visit. There were no vitals filed for this visit.  Estimated body mass index is 26.79 kg/m as calculated from the following:   Height as of 06/12/19: 5'  9" (1.753 m).   Weight as of 07/04/19: 181 lb 6.4 oz (82.3 kg).  LABS: CBC:    Component Value Date/Time   WBC 4.8 07/05/2019 0837   HGB 8.3 (L) 07/05/2019 0837   HGB 11.2 (L) 05/15/2019 1111   HCT 27.3 (L) 07/05/2019 0837   HCT 32.7 (L) 05/15/2019 1111   PLT 387 07/05/2019 0837   PLT 277 05/15/2019 1111   MCV 91.0 07/05/2019 0837   MCV 80 05/15/2019 1111   NEUTROABS 3.3 07/05/2019 0837   NEUTROABS 3.5 05/15/2019 1111   LYMPHSABS 0.7 07/05/2019 0837   LYMPHSABS 1.2 05/15/2019 1111   MONOABS 0.6 07/05/2019 0837   EOSABS 0.0 07/05/2019 0837   EOSABS 0.2 05/15/2019 1111   BASOSABS 0.1 07/05/2019 0837   BASOSABS 0.1 05/15/2019 1111   Comprehensive Metabolic Panel:    Component Value Date/Time   NA 139 07/05/2019 0837   NA 139 05/15/2019 1111   K 4.6 07/05/2019 0837   CL 107 07/05/2019 0837   CO2 21 (L) 07/05/2019 0837   BUN 20 07/05/2019 0837   BUN 23 05/15/2019 1111   CREATININE 1.08 07/05/2019 0837   CREATININE 0.95 06/17/2017 1035   GLUCOSE 183 (H) 07/05/2019 0837   CALCIUM 9.1 07/05/2019 0837   AST 83 (H) 07/05/2019 0837   ALT 237 (H) 07/05/2019 0837   ALKPHOS 70 07/05/2019 0837   BILITOT 0.5 07/05/2019 0837   BILITOT <0.2 05/15/2019 1111   PROT 6.8 07/05/2019 0837   PROT 6.8 05/15/2019 1111   ALBUMIN 3.2 (L) 07/05/2019 0837   ALBUMIN 4.3 05/15/2019 1111    RADIOGRAPHIC STUDIES: No results found.  PERFORMANCE STATUS (ECOG) : 0 - Asymptomatic  Review of Systems Unless otherwise noted, a complete review of systems is negative.  Physical Exam General: NAD, frail appearing, thin Pulmonary: unlabored Extremities: no edema, no joint deformities Skin: no rashes Neurological: Weakness but otherwise nonfocal  IMPRESSION: Routine follow-up visit made today.  Patient was seen in the infusion area while he was receiving treatment.  Patient reports that he is doing well.  He denies any acute changes or concerns today.  He reports his symptoms are well  controlled.  I called and spoke with patient's wife.  She says that she is illiterate but seemed to have limited understanding regarding the staging of the cancer.  She did not verbalize an understanding that the cancer is not curable.  We discussed that it is advanced stage and may eventually result in his his passing.  We discussed future decision making.  She says that patient did not give her the MOST Form as we have previously discussed.  She says that she has tried talking with patient about decision-making but that he is told her that he "does not want talk about it."  She would like for me to readdress the topic when patient is seen in the future.  PLAN: -Continue current scope of treatment -MOST Form previously discussed -RTC in 1 month   Patient expressed understanding and was in agreement with this plan. He also understands that He can call the clinic at any time with any questions, concerns, or complaints.     Time Total: 15 minutes  Visit consisted of counseling and education dealing with the complex and emotionally intense issues of symptom management and palliative care in the setting of serious and potentially life-threatening illness.Greater than 50%  of this time was spent counseling and coordinating care related to the above assessment and plan.  Signed by: Altha Harm, PhD, NP-C [phone] (Work Cell)

## 2019-07-11 ENCOUNTER — Other Ambulatory Visit: Payer: Self-pay

## 2019-07-11 ENCOUNTER — Encounter: Payer: Self-pay | Admitting: Oncology

## 2019-07-11 NOTE — Progress Notes (Signed)
Pre-visit assessment completed prior to [place] appointment on 07/12/2019 with Dr. Tasia Catchings.  Concerns: Pt reports new sudden onset of back pain that began on Tuesday 07/09/2019 and has continued without change. The pain is not associated with activity and began while at rest. It is nonspecific in location and is described as a sharp, "all over my back" pain. He has not taken any medication for relief. He initially states that the pain level is 2 when he is busy moving about but admits that without destraction, it is really about an 8.  Note: He is illiterate and does not know the names of his medication. He will bring all of his current medication to clinic tomorrow for a more thorough review. He states that he takes 6 of them everyday, a few of them only when he needs them, and states that some of them, he doesn't take at all.

## 2019-07-12 ENCOUNTER — Inpatient Hospital Stay: Payer: Medicare Other | Attending: Oncology

## 2019-07-12 ENCOUNTER — Other Ambulatory Visit: Payer: Self-pay

## 2019-07-12 ENCOUNTER — Encounter: Payer: Self-pay | Admitting: Oncology

## 2019-07-12 ENCOUNTER — Inpatient Hospital Stay (HOSPITAL_BASED_OUTPATIENT_CLINIC_OR_DEPARTMENT_OTHER): Payer: Medicare Other | Admitting: Oncology

## 2019-07-12 ENCOUNTER — Inpatient Hospital Stay: Payer: Medicare Other

## 2019-07-12 VITALS — BP 131/71 | HR 87 | Temp 96.4°F | Resp 18 | Wt 181.4 lb

## 2019-07-12 DIAGNOSIS — C787 Secondary malignant neoplasm of liver and intrahepatic bile duct: Secondary | ICD-10-CM | POA: Diagnosis not present

## 2019-07-12 DIAGNOSIS — J984 Other disorders of lung: Secondary | ICD-10-CM | POA: Insufficient documentation

## 2019-07-12 DIAGNOSIS — Z9049 Acquired absence of other specified parts of digestive tract: Secondary | ICD-10-CM | POA: Insufficient documentation

## 2019-07-12 DIAGNOSIS — R7401 Elevation of levels of liver transaminase levels: Secondary | ICD-10-CM | POA: Insufficient documentation

## 2019-07-12 DIAGNOSIS — C772 Secondary and unspecified malignant neoplasm of intra-abdominal lymph nodes: Secondary | ICD-10-CM | POA: Insufficient documentation

## 2019-07-12 DIAGNOSIS — Z5111 Encounter for antineoplastic chemotherapy: Secondary | ICD-10-CM | POA: Insufficient documentation

## 2019-07-12 DIAGNOSIS — R109 Unspecified abdominal pain: Secondary | ICD-10-CM | POA: Diagnosis not present

## 2019-07-12 DIAGNOSIS — R5383 Other fatigue: Secondary | ICD-10-CM | POA: Diagnosis not present

## 2019-07-12 DIAGNOSIS — Z888 Allergy status to other drugs, medicaments and biological substances status: Secondary | ICD-10-CM | POA: Insufficient documentation

## 2019-07-12 DIAGNOSIS — R55 Syncope and collapse: Secondary | ICD-10-CM | POA: Insufficient documentation

## 2019-07-12 DIAGNOSIS — Z87891 Personal history of nicotine dependence: Secondary | ICD-10-CM | POA: Insufficient documentation

## 2019-07-12 DIAGNOSIS — K219 Gastro-esophageal reflux disease without esophagitis: Secondary | ICD-10-CM | POA: Insufficient documentation

## 2019-07-12 DIAGNOSIS — C259 Malignant neoplasm of pancreas, unspecified: Secondary | ICD-10-CM | POA: Diagnosis present

## 2019-07-12 DIAGNOSIS — M25469 Effusion, unspecified knee: Secondary | ICD-10-CM | POA: Insufficient documentation

## 2019-07-12 DIAGNOSIS — R1011 Right upper quadrant pain: Secondary | ICD-10-CM | POA: Insufficient documentation

## 2019-07-12 DIAGNOSIS — R160 Hepatomegaly, not elsewhere classified: Secondary | ICD-10-CM | POA: Diagnosis not present

## 2019-07-12 DIAGNOSIS — I739 Peripheral vascular disease, unspecified: Secondary | ICD-10-CM | POA: Diagnosis not present

## 2019-07-12 DIAGNOSIS — I110 Hypertensive heart disease with heart failure: Secondary | ICD-10-CM | POA: Insufficient documentation

## 2019-07-12 DIAGNOSIS — Z7189 Other specified counseling: Secondary | ICD-10-CM

## 2019-07-12 DIAGNOSIS — R599 Enlarged lymph nodes, unspecified: Secondary | ICD-10-CM | POA: Diagnosis not present

## 2019-07-12 DIAGNOSIS — D6481 Anemia due to antineoplastic chemotherapy: Secondary | ICD-10-CM | POA: Insufficient documentation

## 2019-07-12 DIAGNOSIS — G629 Polyneuropathy, unspecified: Secondary | ICD-10-CM | POA: Diagnosis not present

## 2019-07-12 DIAGNOSIS — J439 Emphysema, unspecified: Secondary | ICD-10-CM | POA: Insufficient documentation

## 2019-07-12 DIAGNOSIS — Z79899 Other long term (current) drug therapy: Secondary | ICD-10-CM | POA: Insufficient documentation

## 2019-07-12 DIAGNOSIS — R200 Anesthesia of skin: Secondary | ICD-10-CM | POA: Insufficient documentation

## 2019-07-12 DIAGNOSIS — D649 Anemia, unspecified: Secondary | ICD-10-CM | POA: Diagnosis not present

## 2019-07-12 DIAGNOSIS — R948 Abnormal results of function studies of other organs and systems: Secondary | ICD-10-CM | POA: Diagnosis not present

## 2019-07-12 DIAGNOSIS — N281 Cyst of kidney, acquired: Secondary | ICD-10-CM | POA: Insufficient documentation

## 2019-07-12 DIAGNOSIS — F1010 Alcohol abuse, uncomplicated: Secondary | ICD-10-CM | POA: Insufficient documentation

## 2019-07-12 DIAGNOSIS — I2510 Atherosclerotic heart disease of native coronary artery without angina pectoris: Secondary | ICD-10-CM | POA: Diagnosis not present

## 2019-07-12 DIAGNOSIS — I700 Atherosclerosis of aorta: Secondary | ICD-10-CM | POA: Insufficient documentation

## 2019-07-12 DIAGNOSIS — I509 Heart failure, unspecified: Secondary | ICD-10-CM | POA: Diagnosis not present

## 2019-07-12 DIAGNOSIS — R918 Other nonspecific abnormal finding of lung field: Secondary | ICD-10-CM | POA: Insufficient documentation

## 2019-07-12 DIAGNOSIS — N400 Enlarged prostate without lower urinary tract symptoms: Secondary | ICD-10-CM | POA: Insufficient documentation

## 2019-07-12 DIAGNOSIS — E785 Hyperlipidemia, unspecified: Secondary | ICD-10-CM | POA: Insufficient documentation

## 2019-07-12 DIAGNOSIS — Z95828 Presence of other vascular implants and grafts: Secondary | ICD-10-CM

## 2019-07-12 LAB — CBC WITH DIFFERENTIAL/PLATELET
Abs Immature Granulocytes: 0.78 10*3/uL — ABNORMAL HIGH (ref 0.00–0.07)
Basophils Absolute: 0.1 10*3/uL (ref 0.0–0.1)
Basophils Relative: 1 %
Eosinophils Absolute: 0 10*3/uL (ref 0.0–0.5)
Eosinophils Relative: 0 %
HCT: 26.2 % — ABNORMAL LOW (ref 39.0–52.0)
Hemoglobin: 8 g/dL — ABNORMAL LOW (ref 13.0–17.0)
Immature Granulocytes: 13 %
Lymphocytes Relative: 11 %
Lymphs Abs: 0.6 10*3/uL — ABNORMAL LOW (ref 0.7–4.0)
MCH: 27.9 pg (ref 26.0–34.0)
MCHC: 30.5 g/dL (ref 30.0–36.0)
MCV: 91.3 fL (ref 80.0–100.0)
Monocytes Absolute: 0.3 10*3/uL (ref 0.1–1.0)
Monocytes Relative: 4 %
Neutro Abs: 4.1 10*3/uL (ref 1.7–7.7)
Neutrophils Relative %: 71 %
Platelets: 338 10*3/uL (ref 150–400)
RBC: 2.87 MIL/uL — ABNORMAL LOW (ref 4.22–5.81)
RDW: 16.8 % — ABNORMAL HIGH (ref 11.5–15.5)
Smear Review: ADEQUATE
WBC: 5.9 10*3/uL (ref 4.0–10.5)
nRBC: 3.7 % — ABNORMAL HIGH (ref 0.0–0.2)

## 2019-07-12 LAB — COMPREHENSIVE METABOLIC PANEL
ALT: 182 U/L — ABNORMAL HIGH (ref 0–44)
AST: 62 U/L — ABNORMAL HIGH (ref 15–41)
Albumin: 3.2 g/dL — ABNORMAL LOW (ref 3.5–5.0)
Alkaline Phosphatase: 67 U/L (ref 38–126)
Anion gap: 8 (ref 5–15)
BUN: 21 mg/dL (ref 8–23)
CO2: 21 mmol/L — ABNORMAL LOW (ref 22–32)
Calcium: 9.1 mg/dL (ref 8.9–10.3)
Chloride: 110 mmol/L (ref 98–111)
Creatinine, Ser: 1.1 mg/dL (ref 0.61–1.24)
GFR calc Af Amer: >60 mL/min (ref >60)
GFR calc non Af Amer: >60 mL/min (ref >60)
Glucose, Bld: 190 mg/dL — ABNORMAL HIGH (ref 70–99)
Potassium: 4 mmol/L (ref 3.5–5.1)
Sodium: 139 mmol/L (ref 135–145)
Total Bilirubin: 0.5 mg/dL (ref 0.3–1.2)
Total Protein: 6.8 g/dL (ref 6.5–8.1)

## 2019-07-12 MED ORDER — SODIUM CHLORIDE 0.9 % IV SOLN
Freq: Once | INTRAVENOUS | Status: AC
  Administered 2019-07-12: 09:00:00 via INTRAVENOUS
  Filled 2019-07-12: qty 250

## 2019-07-12 MED ORDER — SODIUM CHLORIDE 0.9% FLUSH
10.0000 mL | Freq: Once | INTRAVENOUS | Status: AC
  Administered 2019-07-12: 09:00:00 10 mL via INTRAVENOUS
  Filled 2019-07-12: qty 10

## 2019-07-12 MED ORDER — SODIUM CHLORIDE 0.9 % IV SOLN
1600.0000 mg | Freq: Once | INTRAVENOUS | Status: AC
  Administered 2019-07-12: 1600 mg via INTRAVENOUS
  Filled 2019-07-12: qty 26.3

## 2019-07-12 MED ORDER — PACLITAXEL PROTEIN-BOUND CHEMO INJECTION 100 MG
100.0000 mg/m2 | Freq: Once | INTRAVENOUS | Status: AC
  Administered 2019-07-12: 200 mg via INTRAVENOUS
  Filled 2019-07-12: qty 40

## 2019-07-12 MED ORDER — HEPARIN SOD (PORK) LOCK FLUSH 100 UNIT/ML IV SOLN
500.0000 [IU] | Freq: Once | INTRAVENOUS | Status: DC | PRN
  Filled 2019-07-12: qty 5

## 2019-07-12 MED ORDER — HEPARIN SOD (PORK) LOCK FLUSH 100 UNIT/ML IV SOLN
500.0000 [IU] | Freq: Once | INTRAVENOUS | Status: AC
  Administered 2019-07-12: 500 [IU] via INTRAVENOUS

## 2019-07-12 MED ORDER — PROCHLORPERAZINE MALEATE 10 MG PO TABS
10.0000 mg | ORAL_TABLET | Freq: Once | ORAL | Status: AC
  Administered 2019-07-12: 10 mg via ORAL
  Filled 2019-07-12: qty 1

## 2019-07-13 NOTE — Progress Notes (Signed)
Hematology/Oncology  Follow up note Craig [name] [phone] [phone]   Patient Care Team: Craig [name] as PCP - General (Physician Assistant) Craig Jacks, RN as Oncology Nurse Navigator  REFERRING PROVIDER: Trinna Post, PA-C  CHIEF COMPLAINTS/REASON FOR VISIT:  Evaluation of abnormal CT  HISTORY OF PRESENTING ILLNESS:   Craig [name] is a  73 y.o.  male with PMH listed below, including CABG x5, PVD, hypertension, former tobacco abuse, former alcohol abuse, iron deficiency anemia, was seen in consultation at the request of  Craig [name], Craig M, PA-C  for evaluation of abnormal CT Patient recently presented to primary care provider complaining for abdominal pain radiating to his back for about 10 days.  Patient uses Tylenol as needed for pain. Work-up showed elevated amylase, lipase, mild anemia with hemoglobin of 11.3 Acute hepatitis panel negative. 05/16/2019 CT abdomen pelvis with contrast showed poorly marginated heterogeneous hypodense 2.9 cm pancreatic mass at the uncinate process, worrisome for primary pancreatic cancer.  No biliary or pancreatic duct dilation. Several ill defined hypodense liver masses scattered throughout the liver, largest 3.1 cm in the segment 6 right liver lobe. Nonspecific portacaval and aortocaval adenopathy. Extreme medial left lung base 4.4 cm pleural-based mass probably a pleural metastasis.  Additional tiny pulmonary nodules scattered at the right lung base are indeterminate. Mild prostate or megaly  Patient was referred to heme-onc for further evaluation. Patient reports that his abdominal pain was 10 out of 10 a few days ago, he uses Tylenol as needed. Today his pain is getting better, 2 out of 10.  Denies any nausea, vomiting, diarrhea, fever or chills. Married, lives with wife.  Appetite is good.  He weighs 195 pounds today at the clinic. His weight was 204 pounds back in May 2020.   He had a history of 37.5-pack-year smoking history quitting 2018. No longer drinking alcohol.  # ultrasound-guided liver biopsy on 05/21/2019.  Pathology showed fragments of benign hepatic parenchyma showing minimal macro vascular steatosis, otherwise no significant histo pathologic change. Single core of renal tissue showing approximately 25% glomerulosclerosis  # #History of CAD, status [name] CABG x5.  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  # Omniseq test showed PD-L1 50% TPS, TMB high, negative for BRCA1/2 or NTRK fusion.  MSI could not be completed.   INTERVAL HISTORY Craig [name] is a 73 y.o. male who has above history reviewed by me today presents for follow up for evaluation prior to first cycle of chemotherapy. Problems and complaints are listed below: Feeling well today.  Denies any pain today. He did reported to RN yesterday that he had acute back pain on 07/09/2019 not associated with any activity. Worsen with movement.  No pain today.  Blood medication has been adjusted to Coreg and Cardizem, not taking HCTZ. Denies lightheaded.  Appetite is fair.  Weight is stable.  Increased numbness of his finger tips.   Review of Systems  Constitutional: Positive for fatigue and unexpected weight change. Negative for appetite change, chills and fever.  HENT:   Negative for hearing loss and voice change.   Eyes: Negative for eye problems and icterus.  Respiratory: Negative for chest tightness, cough and shortness of breath.   Cardiovascular: Negative for chest pain and leg swelling.  Gastrointestinal: Negative for abdominal distention and abdominal pain.  Endocrine: Negative for hot flashes.  Genitourinary: Negative for difficulty urinating, dysuria  and frequency.   Musculoskeletal: Negative for arthralgias.  Skin: Negative for itching and rash.   Neurological: Negative for light-headedness and numbness.  Hematological: Negative for adenopathy. Does not bruise/bleed easily.  Psychiatric/Behavioral: Negative for confusion.    MEDICAL HISTORY:  Past Medical History:  Diagnosis Date  . Allergic rhinitis   . Cancer [name])    new dx Pancreatic Cancer - Aug 2020  . CHF (congestive heart failure) ([place])   . Chronic cholecystitis   . Coronary artery disease   . DDD (degenerative disc disease), cervical   . Dehydration 06/21/2019  . Dyspnea   . Early cataract   . Erectile dysfunction   . GERD (gastroesophageal reflux disease)   . Gouty arthritis   . Headache    occasional migraines  . Hypercalcemia   . Hyperlipemia   . Hypertension   . Palpitations   . Peripheral vascular disease ([place])   . 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   LIMA to LAD SVG to [place] SVG SEQUENTIALLY to OM1 and OM2 SVG to ACUTE MARGINAL  . Spinal stenosis of cervical region   . Vitamin D deficiency     SURGICAL HISTORY: Past Surgical History:  Procedure Laterality Date  . BACK SURGERY  2018    fusion with screws  . CHOLECYSTECTOMY N/A 11/13/2018   Procedure: LAPAROSCOPIC CHOLECYSTECTOMY;  Surgeon: Vickie Epley, MD;  Location: [facility];  Service: General;  Laterality: N/A;  . COLONOSCOPY    . CORONARY ARTERY BYPASS GRAFT N/A 09/30/2017   Procedure: CORONARY ARTERY BYPASS GRAFTING times five using right and left Saphaneous vein harvested endoscopicly  and left internal mammary artery. (CABG),TEE;  Surgeon: Rexene Alberts, MD;  Location: [place];  Service: Open Heart Surgery;  Laterality: N/A;  . LEFT HEART CATH AND CORONARY ANGIOGRAPHY Left 09/06/2017   Procedure: LEFT HEART CATH AND CORONARY ANGIOGRAPHY;  Surgeon: Corey Skains, MD;  Location: [place] [facility];  Service: Cardiovascular;  Laterality: Left;  . percutaneous transluminal balloon angioplasty  01/2010   of left lower extremity  . PORTA CATH INSERTION N/A 06/06/2019   Procedure: PORTA  CATH INSERTION;  Surgeon: Katha Cabal, MD;  Location: [place] [facility];  Service: Cardiovascular;  Laterality: N/A;  . TEE WITHOUT CARDIOVERSION N/A 09/30/2017   Procedure: TRANSESOPHAGEAL ECHOCARDIOGRAM (TEE);  Surgeon: Rexene Alberts, MD;  Location: [place];  Service: Open Heart Surgery;  Laterality: N/A;  . VASCULAR SURGERY Left 2010   left exernal iliac and superficial femoral artery PTCA and stenting    SOCIAL HISTORY: Social History   Socioeconomic History  . Marital status: Married    Spouse name: Enid Derry  . Number of children: 2  . Years of education: Not on file  . Highest education level: Not on file  Occupational History  . 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 Financial resource strain: Not hard at all  . Food insecurity    Worry: Never true    Inability: Never true  . Transportation needs    Medical: No    Non-medical: No  Tobacco Use  . Smoking status: Former Smoker    Packs/day: 0.75    Years: 50.00    Pack years: 37.50    Types: Cigarettes    Quit date: 09/27/2017    Years since quitting: 1.7  . Smokeless tobacco: Former Systems developer    Types: Chew  Substance and Sexual Activity  . Alcohol use: Not Currently  Comment: stopped drinking before cabg  . Drug use: Not Currently    Types: Marijuana    Comment: stopped smoking before CABG  . Sexual activity: Not Currently  Lifestyle  . Physical activity    Days per week: 5 days    Minutes per session: 150+ min  . Stress: Not at all  Relationships  . Social connections    Talks on phone: More than three times a week    Gets together: More than three times a week    Attends religious service: Never    Active member of club or organization: No    Attends meetings of clubs or organizations: Never    Relationship status: Married  . Intimate partner violence    Fear of current or ex partner: Not on file    Emotionally abused: Not on file    Physically abused: Not on file     Forced sexual activity: Not on file  Other Topics Concern  . Not on file  Social History Narrative  . Not on file    FAMILY HISTORY: Family History  Problem Relation Age of Onset  . Brain cancer Mother   . Emphysema Father   . Cancer Brother     ALLERGIES:  is allergic to lipitor [atorvastatin]; zetia [ezetimibe]; lisinopril; protonix [pantoprazole]; and spironolactone.  MEDICATIONS:  Current Outpatient Medications  Medication Sig Dispense Refill  . aspirin EC 81 MG tablet Take 81 mg by mouth daily.    . carvedilol (COREG) 6.25 MG tablet Take 6.25 mg by mouth 2 (two) times daily with a meal.  3  . diltiazem (CARDIZEM CD) 300 MG 24 hr capsule Take 1 capsule (300 mg total) by mouth daily. 90 capsule 1  . dorzolamide-timolol (COSOPT) 22.3-6.8 MG/ML ophthalmic solution INSTILL ONE DROP INTO BOTH EYES TWICE DAILY    . ferrous sulfate 325 (65 FE) MG tablet TAKE 1 TABLET BY MOUTH EVERY DAY WITH BREAKFAST 90 tablet 1  . gabapentin (NEURONTIN) 300 MG capsule Take 2 capsules (600 mg total) by mouth 3 (three) times daily. 540 capsule 0  . latanoprost (XALATAN) 0.005 % ophthalmic solution Place 1 drop into both eyes at bedtime.   3  . lidocaine-prilocaine (EMLA) cream Apply to affected area once 30 g 3  . magic mouthwash w/lidocaine SOLN Take 5 mLs by mouth 4 (four) times daily as needed for mouth pain. 480 mL 0  . OMEGA-3 FATTY ACIDS PO Take 1,200 mg by mouth daily.     [name] Kitchen omeprazole (PRILOSEC) 40 MG capsule TAKE 1 CAPSULE BY MOUTH EVERY DAY 90 capsule 0  . diphenoxylate-atropine (LOMOTIL) 2.5-0.025 MG tablet Take 1 tablet by mouth 4 (four) times daily as needed for diarrhea or loose stools. (Patient not taking: Reported on 07/12/2019) 30 tablet 0  . hydrochlorothiazide (HYDRODIURIL) 25 MG tablet Take 1 tablet (25 mg total) by mouth daily. (Patient not taking: Reported on 07/04/2019) 90 tablet 3  . Multiple Vitamin (MULTIVITAMIN WITH MINERALS) TABS tablet Take 1 tablet by mouth daily.    .  ondansetron (ZOFRAN) 8 MG tablet Take 1 tablet (8 mg total) by mouth 2 (two) times daily as needed (Nausea or vomiting). (Patient not taking: Reported on 07/12/2019) 30 tablet 1  . oxyCODONE (ROXICODONE) 5 MG immediate release tablet Take 1 tablet (5 mg total) by mouth every 8 (eight) hours as needed for severe pain. (Patient not taking: Reported on 07/12/2019) 30 tablet 0  . prochlorperazine (COMPAZINE) 10 MG tablet Take 1 tablet (10 mg total)  by mouth every 6 (six) hours as needed (Nausea or vomiting). (Patient not taking: Reported on 07/12/2019) 30 tablet 1  . rosuvastatin (CRESTOR) 10 MG tablet Take 1 tablet (10 mg total) by mouth daily. (Patient not taking: Reported on 07/12/2019) 90 tablet 0  . sucralfate (CARAFATE) 1 g tablet Take 1 tablet (1 g total) by mouth 4 (four) times daily -  with meals and at bedtime. (Patient not taking: Reported on 07/04/2019) 28 tablet 0  . triamcinolone cream (KENALOG) 0.1 % APPLY 1 APPLICATION TOPICALLY 2 (TWO) TIMES DAILY. TO EXTERNAL EAR (Patient not taking: Reported on 07/12/2019) 30 g 0  . triamcinolone ointment (KENALOG) 0.5 % Apply 1 application topically 3 (three) times daily. (Patient not taking: Reported on 07/12/2019) 30 g 0  . valsartan (DIOVAN) 160 MG tablet Take 1 tablet (160 mg total) by mouth daily. (Patient not taking: Reported on 07/04/2019) 180 tablet 3   No current facility-administered medications for this visit.      PHYSICAL EXAMINATION: ECOG PERFORMANCE STATUS: 1 - Symptomatic but completely ambulatory Vitals:   07/12/19 0839  BP: 131/71  Pulse: 87  Resp: 18  Temp: (!) 96.4 F (35.8 C)  SpO2: 100%   Filed Weights   07/12/19 0839  Weight: 181 lb 6.4 oz (82.3 kg)    Physical Exam Constitutional:      General: He is not in acute distress.    Comments: Walk in   HENT:     Head: Normocephalic and atraumatic.  Eyes:     General: No scleral icterus.    Pupils: Pupils are equal, round, and reactive to light.  Neck:      Musculoskeletal: Normal range of motion and neck supple.  Cardiovascular:     Rate and Rhythm: Normal rate and regular rhythm.     Heart sounds: Normal heart sounds.  Pulmonary:     Effort: Pulmonary effort is normal. No respiratory distress.     Breath sounds: No wheezing.  Abdominal:     General: Bowel sounds are normal. There is no distension.     Palpations: Abdomen is soft. There is no mass.     Tenderness: There is no abdominal tenderness.  Musculoskeletal: Normal range of motion.        General: No deformity.  Skin:    General: Skin is warm and dry.     Findings: No erythema or rash.  Neurological:     General: No focal deficit present.     Mental Status: He is alert and oriented to person, place, and time.     Cranial Nerves: No cranial nerve deficit.     Coordination: Coordination normal.  Psychiatric:        Behavior: Behavior normal.        Thought Content: Thought content normal.     LABORATORY DATA:  I have reviewed the data as listed Lab Results  Component Value Date   WBC 5.9 07/12/2019   HGB 8.0 (L) 07/12/2019   HCT 26.2 (L) 07/12/2019   MCV 91.3 07/12/2019   PLT 338 07/12/2019   Recent Labs    07/02/19 1052 07/05/19 0837 07/12/19 0831  NA 136 139 139  K 4.4 4.6 4.0  CL 105 107 110  CO2 21* 21* 21*  GLUCOSE 197* 183* 190*  BUN '20 20 21  '$ CREATININE 1.20 1.08 1.10  CALCIUM 9.0 9.1 9.1  GFRNONAA >60 >60 >60  GFRAA >60 >60 >60  PROT 6.7 6.8 6.8  ALBUMIN 3.0* 3.2* 3.2*  AST 141* 83* 62*  ALT 311* 237* 182*  ALKPHOS 82 70 67  BILITOT 0.6 0.5 0.5   Iron/TIBC/Ferritin/ %Sat    Component Value Date/Time   IRON 119 04/27/2019 0940   TIBC 363 04/27/2019 0940   FERRITIN 58 04/27/2019 0940   IRONPCTSAT 33 04/27/2019 0940      RADIOGRAPHIC STUDIES: I have personally reviewed the radiological images as listed and agreed with the findings in the report.  No results found.    ASSESSMENT & PLAN:  1. Pancreatic cancer metastasized to liver  ([place])   2. Port-A-Cath in place   3. Liver metastasis ([place])   4. Goals of care, counseling/discussion   5. Encounter for antineoplastic chemotherapy    #Metastatic pancreatic cancer, He tolerated chemotherapy with moderate difficulties. Labs reviewed and discussed with patient. Counts acceptable to Proceed with cycle 2 -day 8 gemcitabine and Abraxane.  Dehydration/diarrhea, symptom has improved.  Encourage oral hydration. #Transaminitis, trending down.  Okay to proceed with treatments.  #Pre-existing neuropathy, recommend to continue gabapentin 600 mg 3 times daily were   #Normocytic anemia, hemoglobin 8, chemotherapy-induced.  Has a history of iron deficiency. monitor.  Will need to refer to genetic testing  All questions were answered. The patient knows to call the clinic with any problems questions or concerns.  cc Craig Post, PA-C   Return of visit: [place], MD, PhD Hematology Oncology [place] [facility] 07/13/2019

## 2019-07-14 ENCOUNTER — Other Ambulatory Visit: Payer: Self-pay | Admitting: Physician Assistant

## 2019-07-14 DIAGNOSIS — I10 Essential (primary) hypertension: Secondary | ICD-10-CM

## 2019-07-15 ENCOUNTER — Other Ambulatory Visit: Payer: Self-pay | Admitting: Physician Assistant

## 2019-07-18 ENCOUNTER — Encounter: Payer: Self-pay | Admitting: Oncology

## 2019-07-18 ENCOUNTER — Other Ambulatory Visit: Payer: Self-pay

## 2019-07-18 NOTE — Progress Notes (Signed)
Patient pre screened for office appointment, no questions or concerns today. 

## 2019-07-19 ENCOUNTER — Inpatient Hospital Stay: Payer: Medicare Other

## 2019-07-19 ENCOUNTER — Other Ambulatory Visit: Payer: Self-pay

## 2019-07-19 ENCOUNTER — Inpatient Hospital Stay (HOSPITAL_BASED_OUTPATIENT_CLINIC_OR_DEPARTMENT_OTHER): Payer: Medicare Other | Admitting: Oncology

## 2019-07-19 VITALS — BP 138/87 | HR 79 | Temp 96.8°F | Resp 18 | Wt 178.2 lb

## 2019-07-19 DIAGNOSIS — C787 Secondary malignant neoplasm of liver and intrahepatic bile duct: Secondary | ICD-10-CM

## 2019-07-19 DIAGNOSIS — C259 Malignant neoplasm of pancreas, unspecified: Secondary | ICD-10-CM

## 2019-07-19 DIAGNOSIS — Z95828 Presence of other vascular implants and grafts: Secondary | ICD-10-CM

## 2019-07-19 DIAGNOSIS — R5383 Other fatigue: Secondary | ICD-10-CM | POA: Diagnosis not present

## 2019-07-19 DIAGNOSIS — G629 Polyneuropathy, unspecified: Secondary | ICD-10-CM | POA: Diagnosis not present

## 2019-07-19 DIAGNOSIS — Z5111 Encounter for antineoplastic chemotherapy: Secondary | ICD-10-CM | POA: Diagnosis not present

## 2019-07-19 LAB — CBC WITH DIFFERENTIAL/PLATELET
Abs Immature Granulocytes: 0.25 10*3/uL — ABNORMAL HIGH (ref 0.00–0.07)
Basophils Absolute: 0 10*3/uL (ref 0.0–0.1)
Basophils Relative: 1 %
Eosinophils Absolute: 0 10*3/uL (ref 0.0–0.5)
Eosinophils Relative: 1 %
HCT: 25.2 % — ABNORMAL LOW (ref 39.0–52.0)
Hemoglobin: 7.8 g/dL — ABNORMAL LOW (ref 13.0–17.0)
Immature Granulocytes: 6 %
Lymphocytes Relative: 16 %
Lymphs Abs: 0.7 10*3/uL (ref 0.7–4.0)
MCH: 28.2 pg (ref 26.0–34.0)
MCHC: 31 g/dL (ref 30.0–36.0)
MCV: 91 fL (ref 80.0–100.0)
Monocytes Absolute: 0.2 10*3/uL (ref 0.1–1.0)
Monocytes Relative: 6 %
Neutro Abs: 2.8 10*3/uL (ref 1.7–7.7)
Neutrophils Relative %: 70 %
Platelets: 147 10*3/uL — ABNORMAL LOW (ref 150–400)
RBC: 2.77 MIL/uL — ABNORMAL LOW (ref 4.22–5.81)
RDW: 17.2 % — ABNORMAL HIGH (ref 11.5–15.5)
Smear Review: NORMAL
WBC: 4 10*3/uL (ref 4.0–10.5)
nRBC: 4 % — ABNORMAL HIGH (ref 0.0–0.2)

## 2019-07-19 LAB — COMPREHENSIVE METABOLIC PANEL
ALT: 210 U/L — ABNORMAL HIGH (ref 0–44)
AST: 83 U/L — ABNORMAL HIGH (ref 15–41)
Albumin: 3.5 g/dL (ref 3.5–5.0)
Alkaline Phosphatase: 66 U/L (ref 38–126)
Anion gap: 9 (ref 5–15)
BUN: 22 mg/dL (ref 8–23)
CO2: 21 mmol/L — ABNORMAL LOW (ref 22–32)
Calcium: 9.1 mg/dL (ref 8.9–10.3)
Chloride: 106 mmol/L (ref 98–111)
Creatinine, Ser: 1.23 mg/dL (ref 0.61–1.24)
GFR calc Af Amer: >60 mL/min (ref >60)
GFR calc non Af Amer: 58 mL/min — ABNORMAL LOW (ref >60)
Glucose, Bld: 145 mg/dL — ABNORMAL HIGH (ref 70–99)
Potassium: 4.1 mmol/L (ref 3.5–5.1)
Sodium: 136 mmol/L (ref 135–145)
Total Bilirubin: 0.5 mg/dL (ref 0.3–1.2)
Total Protein: 6.9 g/dL (ref 6.5–8.1)

## 2019-07-19 MED ORDER — HEPARIN SOD (PORK) LOCK FLUSH 100 UNIT/ML IV SOLN
500.0000 [IU] | Freq: Once | INTRAVENOUS | Status: AC
  Administered 2019-07-19: 500 [IU] via INTRAVENOUS

## 2019-07-19 MED ORDER — SODIUM CHLORIDE 0.9% FLUSH
10.0000 mL | Freq: Once | INTRAVENOUS | Status: AC
  Administered 2019-07-19: 10 mL via INTRAVENOUS
  Filled 2019-07-19: qty 10

## 2019-07-19 MED ORDER — HEPARIN SOD (PORK) LOCK FLUSH 100 UNIT/ML IV SOLN
INTRAVENOUS | Status: AC
  Filled 2019-07-19: qty 5

## 2019-07-19 NOTE — Progress Notes (Signed)
Hematology/Oncology  Follow up note Craig [name] [phone] [phone]   Patient Care Team: Paulene Floor as PCP - General (Physician Assistant) Clent Jacks, RN as Oncology Nurse Navigator  REFERRING PROVIDER: Trinna Post, PA-C  CHIEF COMPLAINTS/REASON FOR VISIT:  Evaluation of abnormal CT  HISTORY OF PRESENTING ILLNESS:   Craig [name] is a  73 y.o.  male with PMH listed below, including CABG x5, PVD, hypertension, former tobacco abuse, former alcohol abuse, iron deficiency anemia, was seen in consultation at the request of  Terrilee Croak, Adriana M, PA-C  for evaluation of abnormal CT Patient recently presented to primary care provider complaining for abdominal pain radiating to his back for about 10 days.  Patient uses Tylenol as needed for pain. Work-up showed elevated amylase, lipase, mild anemia with hemoglobin of 11.3 Acute hepatitis panel negative. 05/16/2019 CT abdomen pelvis with contrast showed poorly marginated heterogeneous hypodense 2.9 cm pancreatic mass at the uncinate process, worrisome for primary pancreatic cancer.  No biliary or pancreatic duct dilation. Several ill defined hypodense liver masses scattered throughout the liver, largest 3.1 cm in the segment 6 right liver lobe. Nonspecific portacaval and aortocaval adenopathy. Extreme medial left lung base 4.4 cm pleural-based mass probably a pleural metastasis.  Additional tiny pulmonary nodules scattered at the right lung base are indeterminate. Mild prostate or megaly  Patient was referred to heme-onc for further evaluation. Patient reports that his abdominal pain was 10 out of 10 a few days ago, he uses Tylenol as needed. Today his pain is getting better, 2 out of 10.  Denies any nausea, vomiting, diarrhea, fever or chills. Married, lives with wife.  Appetite is good.  He weighs 195 pounds today at the clinic. His weight was 204 pounds back in May  2020.  He had a history of 37.5-pack-year smoking history quitting 2018. No longer drinking alcohol.  # ultrasound-guided liver biopsy on 05/21/2019.  Pathology showed fragments of benign hepatic parenchyma showing minimal macro vascular steatosis, otherwise no significant histo pathologic change. Single core of renal tissue showing approximately 25% glomerulosclerosis  # #History of CAD, status post CABG x5.  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  # Omniseq test showed PD-L1 50% TPS, TMB high, negative for BRCA1/2 or NTRK fusion.  MSI could not be completed.   INTERVAL HISTORY Craig [name] is a 73 y.o. male who has above history reviewed by me today presents for follow up for evaluation prior to first cycle of chemotherapy. Problems and complaints are listed below: Reports feeling very tired and fatigue.  Appetite is good but continues to loose weight.  Increased numbness of his toes.  Denies any nausea, vomiting, diarrhea. Fever or chills. No pain.    Review of Systems  Constitutional: Positive for fatigue and unexpected weight change. Negative for appetite change, chills and fever.  HENT:   Negative for hearing loss and voice change.   Eyes: Negative for eye problems and icterus.  Respiratory: Negative for chest tightness, cough and shortness of breath.   Cardiovascular: Negative for chest pain and leg swelling.  Gastrointestinal: Negative for abdominal distention and abdominal pain.  Endocrine: Negative for hot flashes.  Genitourinary: Negative for difficulty urinating, dysuria and frequency.   Musculoskeletal: Negative for arthralgias.  Skin: Negative for itching and rash.  Neurological: Negative for light-headedness and numbness.  Hematological: Negative for adenopathy. Does not bruise/bleed easily.  Psychiatric/Behavioral: Negative for  confusion.    MEDICAL HISTORY:  Past Medical History:  Diagnosis Date   Allergic rhinitis    Cancer [facility])    new dx Pancreatic Cancer - Aug 2020   CHF (congestive heart failure) (HCC)    Chronic cholecystitis    Coronary artery disease    DDD (degenerative disc disease), cervical    Dehydration 06/21/2019   Dyspnea    Early cataract    Erectile dysfunction    GERD (gastroesophageal reflux disease)    Gouty arthritis    Headache    occasional migraines   Hypercalcemia    Hyperlipemia    Hypertension    Palpitations    Peripheral vascular disease (HCC)    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   LIMA to LAD SVG to [place] SVG SEQUENTIALLY to OM1 and OM2 SVG to ACUTE MARGINAL   Spinal stenosis of cervical region    Vitamin D deficiency     SURGICAL HISTORY: Past Surgical History:  Procedure Laterality Date   BACK SURGERY  2018    fusion with screws   CHOLECYSTECTOMY N/A 11/13/2018   Procedure: LAPAROSCOPIC CHOLECYSTECTOMY;  Surgeon: Vickie Epley, MD;  Location: [facility];  Service: General;  Laterality: N/A;   COLONOSCOPY     CORONARY ARTERY BYPASS GRAFT N/A 09/30/2017   Procedure: CORONARY ARTERY BYPASS GRAFTING times five using right and left Saphaneous vein harvested endoscopicly  and left internal mammary artery. (CABG),TEE;  Surgeon: Rexene Alberts, MD;  Location: [place];  Service: Open Heart Surgery;  Laterality: N/A;   LEFT HEART CATH AND CORONARY ANGIOGRAPHY Left 09/06/2017   Procedure: LEFT HEART CATH AND CORONARY ANGIOGRAPHY;  Surgeon: Corey Skains, MD;  Location: [place] [facility];  Service: Cardiovascular;  Laterality: Left;   percutaneous transluminal balloon angioplasty  01/2010   of left lower extremity   PORTA CATH INSERTION N/A 06/06/2019   Procedure: PORTA CATH INSERTION;  Surgeon: Katha Cabal, MD;  Location: [place] [facility];  Service: Cardiovascular;  Laterality: N/A;   TEE WITHOUT CARDIOVERSION N/A  09/30/2017   Procedure: TRANSESOPHAGEAL ECHOCARDIOGRAM (TEE);  Surgeon: Rexene Alberts, MD;  Location: [place];  Service: Open Heart Surgery;  Laterality: N/A;   VASCULAR SURGERY Left 2010   left exernal iliac and superficial femoral artery PTCA and stenting    SOCIAL HISTORY: Social History   Socioeconomic History   Marital status: Married    Spouse name: Enid Derry   Number of children: 2   Years of education: Not on file   Highest education level: Not on file  Occupational History   Occupation: drove tractors    Comment: retired  Scientist, product/process development strain: Not hard at [employer] insecurity    Worry: Never true    Inability: Never true   Transportation needs    Medical: No    Non-medical: No  Tobacco Use   Smoking status: Former Smoker    Packs/day: 0.75    Years: 50.00    Pack years: 37.50    Types: Cigarettes    Quit date: 09/27/2017    Years since quitting: 1.8   Smokeless tobacco: Former Systems developer    Types: Chew  Substance and Sexual Activity   Alcohol use: Not Currently    Comment: stopped drinking before cabg   Drug use: Not Currently    Types: Marijuana    Comment: stopped smoking before CABG   Sexual activity: Not Currently  Lifestyle  Physical activity    Days per week: 5 days    Minutes per session: 150+ min   Stress: Not at all  Relationships   Social connections    Talks on phone: More than three times a week    Gets together: More than three times a week    Attends religious service: Never    Active member of club or organization: No    Attends meetings of clubs or organizations: Never    Relationship status: Married   Intimate partner violence    Fear of current or ex partner: Not on file    Emotionally abused: Not on file    Physically abused: Not on file    Forced sexual activity: Not on file  Other Topics Concern   Not on file  Social History Narrative   Not on file    FAMILY HISTORY: Family History   Problem Relation Age of Onset   Brain cancer Mother    Emphysema Father    Cancer Brother     ALLERGIES:  is allergic to lipitor [atorvastatin]; zetia [ezetimibe]; lisinopril; protonix [pantoprazole]; and spironolactone.  MEDICATIONS:  Current Outpatient Medications  Medication Sig Dispense Refill   aspirin EC 81 MG tablet Take 81 mg by mouth daily.     carvedilol (COREG) 6.25 MG tablet Take 6.25 mg by mouth 2 (two) times daily with a meal.  3   diltiazem (CARDIZEM CD) 300 MG 24 hr capsule TAKE 1 CAPSULE BY MOUTH EVERY DAY 90 capsule 1   diphenoxylate-atropine (LOMOTIL) 2.5-0.025 MG tablet Take 1 tablet by mouth 4 (four) times daily as needed for diarrhea or loose stools. 30 tablet 0   dorzolamide-timolol (COSOPT) 22.3-6.8 MG/ML ophthalmic solution INSTILL ONE DROP INTO BOTH EYES TWICE DAILY     ferrous sulfate 325 (65 FE) MG tablet TAKE 1 TABLET BY MOUTH EVERY DAY WITH BREAKFAST 90 tablet 1   gabapentin (NEURONTIN) 300 MG capsule TAKE 2 CAPSULES (600 MG TOTAL) BY MOUTH 3 (THREE) TIMES DAILY. 540 capsule 0   hydrochlorothiazide (HYDRODIURIL) 25 MG tablet Take 1 tablet (25 mg total) by mouth daily. 90 tablet 3   latanoprost (XALATAN) 0.005 % ophthalmic solution Place 1 drop into both eyes at bedtime.   3   lidocaine-prilocaine (EMLA) cream Apply to affected area once 30 g 3   magic mouthwash w/lidocaine SOLN Take 5 mLs by mouth 4 (four) times daily as needed for mouth pain. 480 mL 0   Multiple Vitamin (MULTIVITAMIN WITH MINERALS) TABS tablet Take 1 tablet by mouth daily.     OMEGA-3 FATTY ACIDS PO Take 1,200 mg by mouth daily.      omeprazole (PRILOSEC) 40 MG capsule TAKE 1 CAPSULE BY MOUTH EVERY DAY 90 capsule 0   ondansetron (ZOFRAN) 8 MG tablet Take 1 tablet (8 mg total) by mouth 2 (two) times daily as needed (Nausea or vomiting). 30 tablet 1   oxyCODONE (ROXICODONE) 5 MG immediate release tablet Take 1 tablet (5 mg total) by mouth every 8 (eight) hours as needed for  severe pain. 30 tablet 0   prochlorperazine (COMPAZINE) 10 MG tablet Take 1 tablet (10 mg total) by mouth every 6 (six) hours as needed (Nausea or vomiting). (Patient not taking: Reported on 07/12/2019) 30 tablet 1   rosuvastatin (CRESTOR) 10 MG tablet Take 1 tablet (10 mg total) by mouth daily. (Patient not taking: Reported on 07/12/2019) 90 tablet 0   sucralfate (CARAFATE) 1 g tablet Take 1 tablet (1 g total) by mouth  4 (four) times daily -  with meals and at bedtime. (Patient not taking: Reported on 07/04/2019) 28 tablet 0   triamcinolone cream (KENALOG) 0.1 % APPLY 1 APPLICATION TOPICALLY 2 (TWO) TIMES DAILY. TO EXTERNAL EAR (Patient not taking: Reported on 07/12/2019) 30 g 0   triamcinolone ointment (KENALOG) 0.5 % Apply 1 application topically 3 (three) times daily. (Patient not taking: Reported on 07/12/2019) 30 g 0   valsartan (DIOVAN) 160 MG tablet Take 1 tablet (160 mg total) by mouth daily. (Patient not taking: Reported on 07/04/2019) 180 tablet 3   No current facility-administered medications for this visit.      PHYSICAL EXAMINATION: ECOG PERFORMANCE STATUS: 1 - Symptomatic but completely ambulatory Vitals:   07/19/19 0850  BP: 138/87  Pulse: 79  Resp: 18  Temp: (!) 96.8 F (36 C)   Filed Weights   07/19/19 0850  Weight: 178 lb 3.2 oz (80.8 kg)    Physical Exam Constitutional:      General: He is not in acute distress.    Comments: Walk in   HENT:     Head: Normocephalic and atraumatic.  Eyes:     General: No scleral icterus.    Pupils: Pupils are equal, round, and reactive to light.  Neck:     Musculoskeletal: Normal range of motion and neck supple.  Cardiovascular:     Rate and Rhythm: Normal rate and regular rhythm.     Heart sounds: Normal heart sounds.  Pulmonary:     Effort: Pulmonary effort is normal. No respiratory distress.     Breath sounds: No wheezing.  Abdominal:     General: Bowel sounds are normal. There is no distension.     Palpations:  Abdomen is soft. There is no mass.     Tenderness: There is no abdominal tenderness.  Musculoskeletal: Normal range of motion.        General: No deformity.  Skin:    General: Skin is warm and dry.     Findings: No erythema or rash.  Neurological:     General: No focal deficit present.     Mental Status: He is alert and oriented to person, place, and time.     Cranial Nerves: No cranial nerve deficit.     Coordination: Coordination normal.  Psychiatric:        Behavior: Behavior normal.        Thought Content: Thought content normal.     LABORATORY DATA:  I have reviewed the data as listed Lab Results  Component Value Date   WBC 4.0 07/19/2019   HGB 7.8 (L) 07/19/2019   HCT 25.2 (L) 07/19/2019   MCV 91.0 07/19/2019   PLT 147 (L) 07/19/2019   Recent Labs    07/05/19 0837 07/12/19 0831 07/19/19 0819  NA 139 139 136  K 4.6 4.0 4.1  CL 107 110 106  CO2 21* 21* 21*  GLUCOSE 183* 190* 145*  BUN _0 CREATININE 1.08 1.10 1.23  CALCIUM 9.1 9.1 9.1  GFRNONAA >60 >60 58*  GFRAA >60 >60 >60  PROT 6.8 6.8 6.9  ALBUMIN 3.2* 3.2* 3.5  AST 83* 62* 83*  ALT 237* 182* 210*  ALKPHOS 70 67 66  BILITOT 0.5 0.5 0.5   Iron/TIBC/Ferritin/ %Sat    Component Value Date/Time   IRON 119 04/27/2019 0940   TIBC 363 04/27/2019 0940   FERRITIN 58 04/27/2019 0940   IRONPCTSAT 33 04/27/2019 0940      RADIOGRAPHIC STUDIES: I have  personally reviewed the radiological images as listed and agreed with the findings in the report. US Abdomen Complete  Result Date: 05/15/2019 CLINICAL DATA:  72 year old male with right upper quadrant abdominal pain. Cholecystectomy in 2019. EXAM: ABDOMEN ULTRASOUND COMPLETE COMPARISON:  Chest CT 05/10/2019. Ultrasound 07/24/2018. Lumbar MRI 01/15/2017. FINDINGS: Gallbladder: Surgically absent. Common bile duct: Diameter: 2 millimeters, normal. Liver: Around hypoechoic lesion in the right lobe on image 15 measures up to 20 millimeters diameter. 3 smaller  round hypoechoic lesions are suggested in the left lobe (image 25) and central right lobe (image 26). These were not evident in 2019. Background liver echogenicity is within normal limits. Portal vein is patent on color Doppler imaging with normal direction of blood flow towards the liver. IVC: No abnormality visualized. Pancreas: Incompletely visualized due to overlying bowel gas, visualized portions within normal limits. Spleen: Size and appearance within normal limits. Right Kidney: Length: 11.8 centimeters. Chronic left renal lower pole cyst appears not significantly changed since 2018 and benign with no vascular elements (image 47). No right hydronephrosis. Left Kidney: Length: 12.8 centimeters. Stable small benign appearing midpole cyst since 2018, 19 millimeters. No left hydronephrosis. Abdominal aorta: No aneurysm visualized. Other findings: No free fluid. IMPRESSION: 1. Multiple new round hypoechoic liver lesions which were not evident on the 2019 ultrasound range from 8 mm to 20 mm diameter. These are nonspecific but suspicious for hepatic metastatic disease. Given that Chest CT was just recently performed on 05/10/2019, recommend follow-up CT Abdomen and Pelvis with oral and IV contrast. 2. Otherwise negative abdomen ultrasound status post cholecystectomy. These results will be called to the ordering clinician or representative by the Radiologist Assistant, and communication documented in the PACS or zVision Dashboard. Electronically Signed   By: Genevie Ann M.D.   On: 05/15/2019 14:02   Ct Abdomen Pelvis W Contrast  Result Date: 05/16/2019 CLINICAL DATA:  Heavy alcohol abuse. Abdominal pain for 10 days. Indeterminate hypoechoic liver lesions on recent sonogram. EXAM: CT ABDOMEN AND PELVIS WITH CONTRAST TECHNIQUE: Multidetector CT imaging of the abdomen and pelvis was performed using the standard protocol following bolus administration of intravenous contrast. CONTRAST:  178m OMNIPAQUE IOHEXOL 300 MG/ML   SOLN COMPARISON:  05/15/2019 abdominal sonogram. FINDINGS: Lower chest: There is a 4.4 x 2.6 cm pleural based mass at the extreme medial basilar left lung base (series 2/image 12). A few scattered tiny solid pulmonary nodules at the right lung base, largest 4 mm in the right lower lobe (series 3/image 11). Coronary atherosclerosis. Visualized lower sternotomy wires are intact. Hepatobiliary: There are several (at least 8) ill-defined hypodense liver masses scattered throughout the liver, largest 3.1 x 2.7 cm in segment 6 right liver lobe (series 2/image 25) and 1.5 x 1.1 cm in the segment 7 right liver lobe (series 2/image 20). Cholecystectomy. No biliary ductal dilatation. Pancreas: There is a poorly marginated heterogeneous hypodense pancreatic mass at the uncinate process measuring approximately 2.9 x 2.7 cm (series 2/image 31). This mass abuts approximately 30-40% of the circumference of the proximal SMA posteriorly (series 2/image 29). The mass encases a tributary of the SMV (series 2/image 33). No involvement by the mass of the portosplenic venous confluence or main portal vein. No pancreatic duct dilation. Spleen: Normal size. No mass. Adrenals/Urinary Tract: Normal adrenals. Simple bilateral renal cysts, largest 4.5 cm in the lower right kidney. Scattered subcentimeter hypodense renal cortical lesions in both kidneys are too small to characterize. No hydronephrosis. Normal bladder. Stomach/Bowel: Normal non-distended stomach. Normal caliber small bowel with  no small bowel wall thickening. Normal appendix. Normal large bowel with no diverticulosis, large bowel wall thickening or pericolonic fat stranding. Vascular/Lymphatic: Atherosclerotic nonaneurysmal abdominal aorta. Patent portal, splenic, hepatic and renal veins. A few mildly enlarged portacaval nodes, largest 1.4 cm (series 2/image 23). Mildly enlarged 1.0 cm aortocaval node (series 2/image 35). No pelvic adenopathy. Reproductive: Mildly enlarged  prostate. Other: No pneumoperitoneum, ascites or focal fluid collection. Musculoskeletal: No aggressive appearing focal osseous lesions. Status post posterior spinal fixation bilaterally at L4-5. Moderate thoracolumbar spondylosis. IMPRESSION: 1. Poorly marginated heterogeneous hypodense 2.9 cm pancreatic mass at the uncinate process, worrisome for primary pancreatic adenocarcinoma. No biliary or pancreatic duct dilation. MRI abdomen without and with IV contrast is indicated for further evaluation. 2. Several ill-defined hypodense liver masses scattered throughout the liver, largest 3.1 cm in the segment 6 right liver lobe, worrisome for liver metastases. 3. Nonspecific portacaval and aortocaval adenopathy, worrisome for metastatic nodes. 4. 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 Additional tiny pulmonary nodules scattered at the right lung base are indeterminate and should be reassessed on follow-up chest CT in 3 months. 5. Mild prostatomegaly. 6.  Aortic Atherosclerosis (ICD10-I70.0). These results will be called to the ordering clinician or representative by the Radiologist Assistant, and communication documented in the PACS or zVision Dashboard. Electronically Signed   By: Ilona Sorrel M.D.   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 EXAM: NUCLEAR MEDICINE PET SKULL BASE TO THIGH TECHNIQUE: 11.0 mCi F-18 FDG was injected intravenously. Full-ring PET imaging was performed from the skull base to thigh after the radiotracer. CT data was obtained and used for attenuation correction and anatomic localization. Fasting blood glucose: 123 mg/dl COMPARISON:  05/16/2019 CT abdomen/pelvis. 05/10/2019 screening chest CT. FINDINGS: Mediastinal blood pool activity: SUV max 3.2 Liver activity: SUV max NA NECK:  No hypermetabolic lymph nodes in the neck. Incidental CT findings: none CHEST: Bilateral hypermetabolic hilar foci with max SUV 5.2 on the left and 3.5 on the right. No enlarged or hypermetabolic axillary or mediastinal lymph nodes. No hypermetabolic pulmonary findings. A few scattered solid right pulmonary nodules, largest 4 mm in the basilar right lower lobe (series 3/image 125), below PET resolution. The pleural based solid density 4.3 x 2.3 cm structure at the extreme medial left lung base (series 3/image 129) is non hypermetabolic. Incidental CT findings: Three-vessel coronary atherosclerosis status post CABG. Atherosclerotic nonaneurysmal thoracic aorta. Dilated main pulmonary artery (3.6 cm diameter). Intact sternotomy wires. ABDOMEN/PELVIS: Intensely hypermetabolic uncinate process pancreatic mass measuring approximately 4.2 x 2.6 cm with max SUV 14.4 (series 3/image 163). Enlarged hypermetabolic 1.4 cm portacaval node with max SUV 10.5 (series 3/image 155). Several small hypermetabolic aortocaval and left para-aortic lymph nodes, largest 1.0 cm in the aortocaval chain with max SUV 6.2 (series 3/image 171). Several (at least 5) hypermetabolic liver masses scattered throughout the liver, largest 3.5 cm in the segment 6 right liver lobe with max SUV 12.6 (series 3/image 152), 2.4 cm in the central segment 5 right liver lobe with max SUV 13.6 (series 3/image 147) and 2.3 cm in posterior right liver lobe adjacent to the IVC with max SUV 10.9 (series 3/image 147). No abnormal hypermetabolic activity within the adrenal glands or spleen. No hypermetabolic lymph nodes in the pelvis. Incidental CT findings: Cholecystectomy. Simple 4.6 cm lower right  renal cyst. Simple 1.5 cm interpolar left renal cyst. Atherosclerotic nonaneurysmal abdominal aorta. SKELETON: Faint hypermetabolism in the medial right iliac bone with associated faint sclerosis with max SUV 3.3 (series 3/image 215). No additional hypermetabolic osseous  foci. Incidental CT findings: none IMPRESSION: 1. Intensely hypermetabolic uncinate process pancreatic mass, compatible with primary pancreatic adenocarcinoma. 2. Hypermetabolic portacaval, aortocaval and left para-aortic nodal metastases. Bilateral hilar nodal hypermetabolism is also compatible with nodal metastatic disease. 3. Several (at least 5) hypermetabolic liver metastases scattered throughout the liver. 4. Equivocal faint hypermetabolism in the medial right iliac bone with associated faint sclerosis, osseous metastasis not excluded, although potentially degenerative. 5. Tiny scattered right pulmonary nodules, below PET resolution, recommend attention on follow-up chest CT in 3-6 months. 6. Chronic findings include: Aortic Atherosclerosis (ICD10-I70.0). Dilated main pulmonary artery, suggesting pulmonary arterial hypertension. Electronically Signed   By: Ilona Sorrel M.D.   On: 06/04/2019 13:56   US Biopsy (liver)  Result Date: 05/21/2019 INDICATION: Multiple liver lesions. EXAM: ULTRASOUND DIRECTED LIVER BIOPSY. MEDICATIONS: None. ANESTHESIA/SEDATION: Moderate (conscious) sedation was employed during this procedure. A total of Versed 4.5 mg and Fentanyl 100 mcg was administered intravenously. Moderate Sedation Time: 37 minutes. The patient's level of consciousness and vital signs were monitored continuously by radiology nursing throughout the procedure under my direct supervision. FLUOROSCOPY TIME:  Fluoroscopy Time: 0 COMPLICATIONS: None immediate. PROCEDURE: After discussing the risks and benefits of this procedure with the patient informed consent was obtained. Abdomen sterilely prepped and draped. Local anesthesia administered 1% lidocaine. IV conscious sedation performed as above. Ultrasound liver biopsy was performed with a 18 gauge needle. Two good samples obtained. No complications. Post procedure patient stable. Discharge instructions given. Follow-up with patient's physician. IMPRESSION:  Successful ultrasound directed liver mass biopsy. Electronically Signed   By: Marcello Moores  Register   On: 05/21/2019 09:55   Ct Biopsy  Result Date: 05/24/2019 INDICATION: Probable liver metastasis. EXAM: CT DIRECTED LIVER MASS BIOPSY MEDICATIONS: None. ANESTHESIA/SEDATION: Moderate (conscious) sedation was employed during this procedure. A total of Versed 3.0 mg and Fentanyl 50.0 mcg was administered intravenously. Moderate Sedation Time: 15 minutes. The patient's level of consciousness and vital signs were monitored continuously by radiology nursing throughout the procedure under my direct supervision. FLUOROSCOPY TIME:  Fluoroscopy Time: 9 COMPLICATIONS: None immediate. PROCEDURE: After discussing the risks and benefits of this procedure with the patient informed consent was obtained local anesthesia administered 1% lidocaine. Under CT guidance CT-directed liver mass biopsy was performed without difficulty. Two 13 mm 18 gauge core samples obtained and sent to pathology. Post biopsy CT revealed no complications. Patient was transferred to specials recovery. Patient was evaluated in [facility] recovery and noted to be stable. Discharge instructions were given to him and his wife. Follow-up is with oncology. IMPRESSION: Successful CT directed liver mass biopsy. Electronically Signed   By: Marcello Moores  Register   On: 05/24/2019 12:22   Ct Chest Lung Cancer Screening Low Dose Wo Contrast  Result Date: 05/10/2019 CLINICAL DATA:  73 year old male with 38 pack-year history of smoking. Lung cancer screening. EXAM: CT CHEST WITHOUT CONTRAST LOW-DOSE FOR LUNG CANCER SCREENING TECHNIQUE: Multidetector CT imaging of the chest was performed following the standard protocol without IV contrast. COMPARISON:  None. FINDINGS: Cardiovascular: The heart size is normal. No substantial pericardial effusion. Status post CABG. Atherosclerotic calcification is noted in the wall of the thoracic aorta. Mediastinum/Nodes: No mediastinal  lymphadenopathy. No evidence for gross hilar lymphadenopathy although assessment is limited by the lack of intravenous contrast on today's study. The  esophagus has normal imaging features. There is no axillary lymphadenopathy. Lungs/Pleura: Numerous tiny pulmonary nodules are seen scattered in both lungs, measuring up to a maximum volume derived equivalent diameter of 4.4 mm. No overtly suspicious nodule or mass. No pleural effusion. Centrilobular emphsyema noted. Upper Abdomen: Unremarkable. Musculoskeletal: No worrisome lytic or sclerotic osseous abnormality. Centrilobular emphsyema noted. IMPRESSION: 1. Lung-RADS 2, benign appearance or behavior. Continue annual screening with low-dose chest CT without contrast in 12 months. 2.  Aortic Atherosclerois (ICD10-170.0) 3.  Emphysema. ([id number]) Electronically Signed   By: Misty Stanley M.D.   On: 05/10/2019 17:31      ASSESSMENT & PLAN:  1. Pancreatic cancer metastasized to liver ([place])   2. Liver metastasis ([place])   3. Neuropathy   4. Fatigue, unspecified type    #Metastatic pancreatic cancer Tolerates chemotherapy with moderate difficulties.  Fatigue has gotten worse.  Anemia, hemoglobin dropped below 8, today at 7.8.chemotherapy related.  Hold chemotherapy today.  Will switch to Gemcitabine/Abraxane 2 weeks on and 1 week off.   # Transaminitis, stable monitor.  # weight loss, continue follow up with Dietitian.  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  All questions were answered. The patient knows to call the clinic with any problems questions or concerns.  cc Trinna Post, PA-C   Return of visit: [place], MD, PhD Hematology Oncology [place] [name] 07/19/2019

## 2019-07-19 NOTE — Progress Notes (Signed)
Per Elizabeth RN per Dr. Yu no treatment at this time. Pt stable at discharge.  

## 2019-07-21 ENCOUNTER — Other Ambulatory Visit: Payer: Self-pay | Admitting: Physician Assistant

## 2019-07-21 DIAGNOSIS — E782 Mixed hyperlipidemia: Secondary | ICD-10-CM

## 2019-07-25 ENCOUNTER — Encounter: Payer: Self-pay | Admitting: Oncology

## 2019-07-25 ENCOUNTER — Other Ambulatory Visit: Payer: Self-pay

## 2019-07-25 NOTE — Progress Notes (Signed)
Patient pre screened for office appointment, no questions or concerns today. 

## 2019-07-26 ENCOUNTER — Inpatient Hospital Stay: Payer: Medicare Other

## 2019-07-26 ENCOUNTER — Inpatient Hospital Stay (HOSPITAL_BASED_OUTPATIENT_CLINIC_OR_DEPARTMENT_OTHER): Payer: Medicare Other | Admitting: Oncology

## 2019-07-26 ENCOUNTER — Other Ambulatory Visit: Payer: Self-pay

## 2019-07-26 VITALS — BP 114/70 | HR 93 | Temp 98.0°F | Resp 18 | Wt 182.8 lb

## 2019-07-26 DIAGNOSIS — G629 Polyneuropathy, unspecified: Secondary | ICD-10-CM | POA: Diagnosis not present

## 2019-07-26 DIAGNOSIS — C787 Secondary malignant neoplasm of liver and intrahepatic bile duct: Secondary | ICD-10-CM | POA: Diagnosis not present

## 2019-07-26 DIAGNOSIS — Z5111 Encounter for antineoplastic chemotherapy: Secondary | ICD-10-CM | POA: Diagnosis not present

## 2019-07-26 DIAGNOSIS — C259 Malignant neoplasm of pancreas, unspecified: Secondary | ICD-10-CM | POA: Diagnosis not present

## 2019-07-26 DIAGNOSIS — R5383 Other fatigue: Secondary | ICD-10-CM

## 2019-07-26 LAB — CBC WITH DIFFERENTIAL/PLATELET
Abs Immature Granulocytes: 0.09 10*3/uL — ABNORMAL HIGH (ref 0.00–0.07)
Basophils Absolute: 0.1 10*3/uL (ref 0.0–0.1)
Basophils Relative: 1 %
Eosinophils Absolute: 0.1 10*3/uL (ref 0.0–0.5)
Eosinophils Relative: 2 %
HCT: 30.9 % — ABNORMAL LOW (ref 39.0–52.0)
Hemoglobin: 9.2 g/dL — ABNORMAL LOW (ref 13.0–17.0)
Immature Granulocytes: 2 %
Lymphocytes Relative: 12 %
Lymphs Abs: 0.8 10*3/uL (ref 0.7–4.0)
MCH: 28 pg (ref 26.0–34.0)
MCHC: 29.8 g/dL — ABNORMAL LOW (ref 30.0–36.0)
MCV: 94.2 fL (ref 80.0–100.0)
Monocytes Absolute: 0.8 10*3/uL (ref 0.1–1.0)
Monocytes Relative: 13 %
Neutro Abs: 4.3 10*3/uL (ref 1.7–7.7)
Neutrophils Relative %: 70 %
Platelets: 322 10*3/uL (ref 150–400)
RBC: 3.28 MIL/uL — ABNORMAL LOW (ref 4.22–5.81)
RDW: 20.3 % — ABNORMAL HIGH (ref 11.5–15.5)
WBC: 6.1 10*3/uL (ref 4.0–10.5)
nRBC: 0 % (ref 0.0–0.2)

## 2019-07-26 LAB — COMPREHENSIVE METABOLIC PANEL
ALT: 71 U/L — ABNORMAL HIGH (ref 0–44)
AST: 28 U/L (ref 15–41)
Albumin: 3.3 g/dL — ABNORMAL LOW (ref 3.5–5.0)
Alkaline Phosphatase: 64 U/L (ref 38–126)
Anion gap: 7 (ref 5–15)
BUN: 17 mg/dL (ref 8–23)
CO2: 23 mmol/L (ref 22–32)
Calcium: 9 mg/dL (ref 8.9–10.3)
Chloride: 106 mmol/L (ref 98–111)
Creatinine, Ser: 0.98 mg/dL (ref 0.61–1.24)
GFR calc Af Amer: >60 mL/min (ref >60)
GFR calc non Af Amer: >60 mL/min (ref >60)
Glucose, Bld: 211 mg/dL — ABNORMAL HIGH (ref 70–99)
Potassium: 4 mmol/L (ref 3.5–5.1)
Sodium: 136 mmol/L (ref 135–145)
Total Bilirubin: 0.8 mg/dL (ref 0.3–1.2)
Total Protein: 6.5 g/dL (ref 6.5–8.1)

## 2019-07-26 MED ORDER — SODIUM CHLORIDE 0.9 % IV SOLN
Freq: Once | INTRAVENOUS | Status: AC
  Administered 2019-07-26: 10:00:00 via INTRAVENOUS
  Filled 2019-07-26: qty 250

## 2019-07-26 MED ORDER — SODIUM CHLORIDE 0.9 % IV SOLN
1600.0000 mg | Freq: Once | INTRAVENOUS | Status: AC
  Administered 2019-07-26: 1600 mg via INTRAVENOUS
  Filled 2019-07-26: qty 26.3

## 2019-07-26 MED ORDER — HEPARIN SOD (PORK) LOCK FLUSH 100 UNIT/ML IV SOLN
500.0000 [IU] | Freq: Once | INTRAVENOUS | Status: AC | PRN
  Administered 2019-07-26: 500 [IU]
  Filled 2019-07-26: qty 5

## 2019-07-26 MED ORDER — PACLITAXEL PROTEIN-BOUND CHEMO INJECTION 100 MG
100.0000 mg/m2 | Freq: Once | INTRAVENOUS | Status: AC
  Administered 2019-07-26: 200 mg via INTRAVENOUS
  Filled 2019-07-26: qty 40

## 2019-07-26 MED ORDER — SODIUM CHLORIDE 0.9% FLUSH
10.0000 mL | Freq: Once | INTRAVENOUS | Status: AC
  Administered 2019-07-26: 09:00:00 10 mL via INTRAVENOUS
  Filled 2019-07-26: qty 10

## 2019-07-26 MED ORDER — PROCHLORPERAZINE MALEATE 10 MG PO TABS
10.0000 mg | ORAL_TABLET | Freq: Once | ORAL | Status: AC
  Administered 2019-07-26: 10 mg via ORAL
  Filled 2019-07-26: qty 1

## 2019-07-26 NOTE — Progress Notes (Signed)
Hematology/Oncology  Follow up note [facility] Telephone:([phone]) [phone] [phone]) [phone]   Patient Care Team: Paulene Floor as PCP - General (Physician Assistant) Clent Jacks, RN as Oncology Nurse Navigator  REFERRING PROVIDER: Trinna Post, PA-C  CHIEF COMPLAINTS/REASON FOR VISIT:  Evaluation of abnormal CT  HISTORY OF PRESENTING ILLNESS:   Craig [name] is a  73 y.o.  male with PMH listed below, including CABG x5, PVD, hypertension, former tobacco abuse, former alcohol abuse, iron deficiency anemia, was seen in consultation at the request of  Terrilee Croak, Adriana M, PA-C  for evaluation of abnormal CT Patient recently presented to primary care provider complaining for abdominal pain radiating to his back for about 10 days.  Patient uses Tylenol as needed for pain. Work-up showed elevated amylase, lipase, mild anemia with hemoglobin of 11.3 Acute hepatitis panel negative. 05/16/2019 CT abdomen pelvis with contrast showed poorly marginated heterogeneous hypodense 2.9 cm pancreatic mass at the uncinate process, worrisome for primary pancreatic cancer.  No biliary or pancreatic duct dilation. Several ill defined hypodense liver masses scattered throughout the liver, largest 3.1 cm in the segment 6 right liver lobe. Nonspecific portacaval and aortocaval adenopathy. Extreme medial left lung base 4.4 cm pleural-based mass probably a pleural metastasis.  Additional tiny pulmonary nodules scattered at the right lung base are indeterminate. Mild prostate or megaly  Patient was referred to heme-onc for further evaluation. Patient reports that his abdominal pain was 10 out of 10 a few days ago, he uses Tylenol as needed. Today his pain is getting better, 2 out of 10.  Denies any nausea, vomiting, diarrhea, fever or chills. Married, lives with wife.  Appetite is good.  He weighs 195 pounds today at the clinic. His weight was 204 pounds back in May  2020.  He had a history of 37.5-pack-year smoking history quitting 2018. No longer drinking alcohol.  # ultrasound-guided liver biopsy on 05/21/2019.  Pathology showed fragments of benign hepatic parenchyma showing minimal macro vascular steatosis, otherwise no significant histo pathologic change. Single core of renal tissue showing approximately 25% glomerulosclerosis  # #History of CAD, status post CABG x5.  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  # Omniseq test showed PD-L1 50% TPS, TMB high, negative for BRCA1/2 or NTRK fusion.  MSI could not be completed.   INTERVAL HISTORY Craig [name] is a 73 y.o. male who has above history reviewed by me today presents for follow up for evaluation prior to first cycle of chemotherapy. Problems and complaints are listed below: Patient reports feeling okay today. Fatigued and tired has slightly improved. Appetite remains good. Numbness of his toes, stable. Denies any fever, chills, nausea, vomiting, diarrhea.  Denies any pain today  Review of Systems  Constitutional: Positive for fatigue and unexpected weight change. Negative for appetite change, chills and fever.  HENT:   Negative for hearing loss and voice change.   Eyes: Negative for eye problems and icterus.  Respiratory: Negative for chest tightness, cough and shortness of breath.   Cardiovascular: Negative for chest pain and leg swelling.  Gastrointestinal: Negative for abdominal distention and abdominal pain.  Endocrine: Negative for hot flashes.  Genitourinary: Negative for difficulty urinating, dysuria and frequency.   Musculoskeletal: Negative for arthralgias.  Skin: Negative for itching and rash.  Neurological: Negative for light-headedness and numbness.  Hematological: Negative for adenopathy. Does not bruise/bleed easily.  Psychiatric/Behavioral:  Negative for confusion.    MEDICAL HISTORY:  Past Medical History:  Diagnosis Date   Allergic rhinitis    Cancer [facility])    new dx Pancreatic Cancer - Aug 2020   CHF (congestive heart failure) (HCC)    Chronic cholecystitis    Coronary artery disease    DDD (degenerative disc disease), cervical    Dehydration 06/21/2019   Dyspnea    Early cataract    Erectile dysfunction    GERD (gastroesophageal reflux disease)    Gouty arthritis    Headache    occasional migraines   Hypercalcemia    Hyperlipemia    Hypertension    Palpitations    Peripheral vascular disease (HCC)    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   LIMA to LAD SVG to [place] SVG SEQUENTIALLY to OM1 and OM2 SVG to ACUTE MARGINAL   Spinal stenosis of cervical region    Vitamin D deficiency     SURGICAL HISTORY: Past Surgical History:  Procedure Laterality Date   BACK SURGERY  2018    fusion with screws   CHOLECYSTECTOMY N/A 11/13/2018   Procedure: LAPAROSCOPIC CHOLECYSTECTOMY;  Surgeon: Vickie Epley, MD;  Location: [facility] ORS;  Service: General;  Laterality: N/A;   COLONOSCOPY     CORONARY ARTERY BYPASS GRAFT N/A 09/30/2017   Procedure: CORONARY ARTERY BYPASS GRAFTING times five using right and left Saphaneous vein harvested endoscopicly  and left internal mammary artery. (CABG),TEE;  Surgeon: Rexene Alberts, MD;  Location: [place];  Service: Open Heart Surgery;  Laterality: N/A;   LEFT HEART CATH AND CORONARY ANGIOGRAPHY Left 09/06/2017   Procedure: LEFT HEART CATH AND CORONARY ANGIOGRAPHY;  Surgeon: Corey Skains, MD;  Location: [place] [facility];  Service: Cardiovascular;  Laterality: Left;   percutaneous transluminal balloon angioplasty  01/2010   of left lower extremity   PORTA CATH INSERTION N/A 06/06/2019   Procedure: PORTA CATH INSERTION;  Surgeon: Katha Cabal, MD;  Location: [place] [facility];  Service: Cardiovascular;  Laterality: N/A;   TEE WITHOUT CARDIOVERSION  N/A 09/30/2017   Procedure: TRANSESOPHAGEAL ECHOCARDIOGRAM (TEE);  Surgeon: Rexene Alberts, MD;  Location: [place];  Service: Open Heart Surgery;  Laterality: N/A;   VASCULAR SURGERY Left 2010   left exernal iliac and superficial femoral artery PTCA and stenting    SOCIAL HISTORY: Social History   Socioeconomic History   Marital status: Married    Spouse name: Enid Derry   Number of children: 2   Years of education: Not on file   Highest education level: Not on file  Occupational History   Occupation: drove tractors    Comment: retired  Scientist, product/process development strain: Not hard at [employer] insecurity    Worry: Never true    Inability: Never true   Transportation needs    Medical: No    Non-medical: No  Tobacco Use   Smoking status: Former Smoker    Packs/day: 0.75    Years: 50.00    Pack years: 37.50    Types: Cigarettes    Quit date: 09/27/2017    Years since quitting: 1.8   Smokeless tobacco: Former Systems developer    Types: Chew  Substance and Sexual Activity   Alcohol use: Not Currently    Comment: stopped drinking before cabg   Drug use: Not Currently    Types: Marijuana    Comment: stopped smoking before CABG   Sexual activity: Not Currently  Lifestyle  Physical activity    Days per week: 5 days    Minutes per session: 150+ min   Stress: Not at all  Relationships   Social connections    Talks on phone: More than three times a week    Gets together: More than three times a week    Attends religious service: Never    Active member of club or organization: No    Attends meetings of clubs or organizations: Never    Relationship status: Married   Intimate partner violence    Fear of current or ex partner: Not on file    Emotionally abused: Not on file    Physically abused: Not on file    Forced sexual activity: Not on file  Other Topics Concern   Not on file  Social History Narrative   Not on file    FAMILY HISTORY: Family History   Problem Relation Age of Onset   Brain cancer Mother    Emphysema Father    Cancer Brother     ALLERGIES:  is allergic to lipitor [atorvastatin]; zetia [ezetimibe]; lisinopril; protonix [pantoprazole]; and spironolactone.  MEDICATIONS:  Current Outpatient Medications  Medication Sig Dispense Refill   aspirin EC 81 MG tablet Take 81 mg by mouth daily.     carvedilol (COREG) 6.25 MG tablet Take 6.25 mg by mouth 2 (two) times daily with a meal.  3   diltiazem (CARDIZEM CD) 300 MG 24 hr capsule TAKE 1 CAPSULE BY MOUTH EVERY DAY 90 capsule 1   diphenoxylate-atropine (LOMOTIL) 2.5-0.025 MG tablet Take 1 tablet by mouth 4 (four) times daily as needed for diarrhea or loose stools. 30 tablet 0   dorzolamide-timolol (COSOPT) 22.3-6.8 MG/ML ophthalmic solution INSTILL ONE DROP INTO BOTH EYES TWICE DAILY     ferrous sulfate 325 (65 FE) MG tablet TAKE 1 TABLET BY MOUTH EVERY DAY WITH BREAKFAST 90 tablet 1   gabapentin (NEURONTIN) 300 MG capsule TAKE 2 CAPSULES (600 MG TOTAL) BY MOUTH 3 (THREE) TIMES DAILY. 540 capsule 0   hydrochlorothiazide (HYDRODIURIL) 25 MG tablet Take 1 tablet (25 mg total) by mouth daily. 90 tablet 3   latanoprost (XALATAN) 0.005 % ophthalmic solution Place 1 drop into both eyes at bedtime.   3   lidocaine-prilocaine (EMLA) cream Apply to affected area once 30 g 3   magic mouthwash w/lidocaine SOLN Take 5 mLs by mouth 4 (four) times daily as needed for mouth pain. 480 mL 0   Multiple Vitamin (MULTIVITAMIN WITH MINERALS) TABS tablet Take 1 tablet by mouth daily.     OMEGA-3 FATTY ACIDS PO Take 1,200 mg by mouth daily.      omeprazole (PRILOSEC) 40 MG capsule TAKE 1 CAPSULE BY MOUTH EVERY DAY 90 capsule 0   ondansetron (ZOFRAN) 8 MG tablet Take 1 tablet (8 mg total) by mouth 2 (two) times daily as needed (Nausea or vomiting). 30 tablet 1   oxyCODONE (ROXICODONE) 5 MG immediate release tablet Take 1 tablet (5 mg total) by mouth every 8 (eight) hours as needed for  severe pain. 30 tablet 0   prochlorperazine (COMPAZINE) 10 MG tablet Take 1 tablet (10 mg total) by mouth every 6 (six) hours as needed (Nausea or vomiting). 30 tablet 1   rosuvastatin (CRESTOR) 10 MG tablet TAKE 1 TABLET BY MOUTH EVERY DAY 90 tablet 0   sucralfate (CARAFATE) 1 g tablet Take 1 tablet (1 g total) by mouth 4 (four) times daily -  with meals and at bedtime. 28 tablet 0  triamcinolone cream (KENALOG) 0.1 % APPLY 1 APPLICATION TOPICALLY 2 (TWO) TIMES DAILY. TO EXTERNAL EAR 30 g 0   triamcinolone ointment (KENALOG) 0.5 % Apply 1 application topically 3 (three) times daily. (Patient not taking: Reported on 07/12/2019) 30 g 0   valsartan (DIOVAN) 160 MG tablet Take 1 tablet (160 mg total) by mouth daily. (Patient not taking: Reported on 07/04/2019) 180 tablet 3   No current facility-administered medications for this visit.      PHYSICAL EXAMINATION: ECOG PERFORMANCE STATUS: 1 - Symptomatic but completely ambulatory Vitals:   07/26/19 0843  BP: 114/70  Pulse: 93  Resp: 18  Temp: 98 F (36.7 C)   Filed Weights   07/26/19 0843  Weight: 182 lb 12.8 oz (82.9 kg)    Physical Exam Constitutional:      General: He is not in acute distress.    Comments: Walk in   HENT:     Head: Normocephalic and atraumatic.  Eyes:     General: No scleral icterus.    Pupils: Pupils are equal, round, and reactive to light.  Neck:     Musculoskeletal: Normal range of motion and neck supple.  Cardiovascular:     Rate and Rhythm: Normal rate and regular rhythm.     Heart sounds: Normal heart sounds.  Pulmonary:     Effort: Pulmonary effort is normal. No respiratory distress.     Breath sounds: No wheezing.  Abdominal:     General: Bowel sounds are normal. There is no distension.     Palpations: Abdomen is soft. There is no mass.     Tenderness: There is no abdominal tenderness.  Musculoskeletal: Normal range of motion.        General: No deformity.  Skin:    General: Skin is warm  and dry.     Findings: No erythema or rash.  Neurological:     General: No focal deficit present.     Mental Status: He is alert and oriented to person, place, and time.     Cranial Nerves: No cranial nerve deficit.     Coordination: Coordination normal.  Psychiatric:        Behavior: Behavior normal.        Thought Content: Thought content normal.     LABORATORY DATA:  I have reviewed the data as listed Lab Results  Component Value Date   WBC 6.1 07/26/2019   HGB 9.2 (L) 07/26/2019   HCT 30.9 (L) 07/26/2019   MCV 94.2 07/26/2019   PLT 322 07/26/2019   Recent Labs    07/12/19 0831 07/19/19 0819 07/26/19 0831  NA 139 136 136  K 4.0 4.1 4.0  CL 110 106 106  CO2 21* 21* 23  GLUCOSE 190* 145* 211*  BUN '21 22 17  '$ CREATININE 1.10 1.23 0.98  CALCIUM 9.1 9.1 9.0  GFRNONAA >60 58* >60  GFRAA >60 >60 >60  PROT 6.8 6.9 6.5  ALBUMIN 3.2* 3.5 3.3*  AST 62* 83* 28  ALT 182* 210* 71*  ALKPHOS 67 66 64  BILITOT 0.5 0.5 0.8   Iron/TIBC/Ferritin/ %Sat    Component Value Date/Time   IRON 119 04/27/2019 0940   TIBC 363 04/27/2019 0940   FERRITIN 58 04/27/2019 0940   IRONPCTSAT 33 04/27/2019 0940      RADIOGRAPHIC STUDIES: I have personally reviewed the radiological images as listed and agreed with the findings in the report. US Abdomen Complete  Result Date: 05/15/2019 CLINICAL DATA:  73 year old male with right upper  quadrant abdominal pain. Cholecystectomy in 2019. EXAM: ABDOMEN ULTRASOUND COMPLETE COMPARISON:  Chest CT 05/10/2019. Ultrasound 07/24/2018. Lumbar MRI 01/15/2017. FINDINGS: Gallbladder: Surgically absent. Common bile duct: Diameter: 2 millimeters, normal. Liver: Around hypoechoic lesion in the right lobe on image 15 measures up to 20 millimeters diameter. 3 smaller round hypoechoic lesions are suggested in the left lobe (image 25) and central right lobe (image 26). These were not evident in 2019. Background liver echogenicity is within normal limits. Portal vein  is patent on color Doppler imaging with normal direction of blood flow towards the liver. IVC: No abnormality visualized. Pancreas: Incompletely visualized due to overlying bowel gas, visualized portions within normal limits. Spleen: Size and appearance within normal limits. Right Kidney: Length: 11.8 centimeters. Chronic left renal lower pole cyst appears not significantly changed since 2018 and benign with no vascular elements (image 47). No right hydronephrosis. Left Kidney: Length: 12.8 centimeters. Stable small benign appearing midpole cyst since 2018, 19 millimeters. No left hydronephrosis. Abdominal aorta: No aneurysm visualized. Other findings: No free fluid. IMPRESSION: 1. Multiple new round hypoechoic liver lesions which were not evident on the 2019 ultrasound range from 8 mm to 20 mm diameter. These are nonspecific but suspicious for hepatic metastatic disease. Given that Chest CT was just recently performed on 05/10/2019, recommend follow-up CT Abdomen and Pelvis with oral and IV contrast. 2. Otherwise negative abdomen ultrasound status post cholecystectomy. These results will be called to the ordering clinician or representative by the Radiologist Assistant, and communication documented in the PACS or zVision Dashboard. Electronically Signed   By: Genevie Ann M.D.   On: 05/15/2019 14:02   Ct Abdomen Pelvis W Contrast  Result Date: 05/16/2019 CLINICAL DATA:  Heavy alcohol abuse. Abdominal pain for 10 days. Indeterminate hypoechoic liver lesions on recent sonogram. EXAM: CT ABDOMEN AND PELVIS WITH CONTRAST TECHNIQUE: Multidetector CT imaging of the abdomen and pelvis was performed using the standard protocol following bolus administration of intravenous contrast. CONTRAST:  163m OMNIPAQUE IOHEXOL 300 MG/ML  SOLN COMPARISON:  05/15/2019 abdominal sonogram. FINDINGS: Lower chest: There is a 4.4 x 2.6 cm pleural based mass at the extreme medial basilar left lung base (series 2/image 12). A few scattered tiny  solid pulmonary nodules at the right lung base, largest 4 mm in the right lower lobe (series 3/image 11). Coronary atherosclerosis. Visualized lower sternotomy wires are intact. Hepatobiliary: There are several (at least 8) ill-defined hypodense liver masses scattered throughout the liver, largest 3.1 x 2.7 cm in segment 6 right liver lobe (series 2/image 25) and 1.5 x 1.1 cm in the segment 7 right liver lobe (series 2/image 20). Cholecystectomy. No biliary ductal dilatation. Pancreas: There is a poorly marginated heterogeneous hypodense pancreatic mass at the uncinate process measuring approximately 2.9 x 2.7 cm (series 2/image 31). This mass abuts approximately 30-40% of the circumference of the proximal SMA posteriorly (series 2/image 29). The mass encases a tributary of the SMV (series 2/image 33). No involvement by the mass of the portosplenic venous confluence or main portal vein. No pancreatic duct dilation. Spleen: Normal size. No mass. Adrenals/Urinary Tract: Normal adrenals. Simple bilateral renal cysts, largest 4.5 cm in the lower right kidney. Scattered subcentimeter hypodense renal cortical lesions in both kidneys are too small to characterize. No hydronephrosis. Normal bladder. Stomach/Bowel: Normal non-distended stomach. Normal caliber small bowel with no small bowel wall thickening. Normal appendix. Normal large bowel with no diverticulosis, large bowel wall thickening or pericolonic fat stranding. Vascular/Lymphatic: Atherosclerotic nonaneurysmal abdominal aorta. Patent portal, splenic, hepatic  and renal veins. A few mildly enlarged portacaval nodes, largest 1.4 cm (series 2/image 23). Mildly enlarged 1.0 cm aortocaval node (series 2/image 35). No pelvic adenopathy. Reproductive: Mildly enlarged prostate. Other: No pneumoperitoneum, ascites or focal fluid collection. Musculoskeletal: No aggressive appearing focal osseous lesions. Status post posterior spinal fixation bilaterally at L4-5. Moderate  thoracolumbar spondylosis. IMPRESSION: 1. Poorly marginated heterogeneous hypodense 2.9 cm pancreatic mass at the uncinate process, worrisome for primary pancreatic adenocarcinoma. No biliary or pancreatic duct dilation. MRI abdomen without and with IV contrast is indicated for further evaluation. 2. Several ill-defined hypodense liver masses scattered throughout the liver, largest 3.1 cm in the segment 6 right liver lobe, worrisome for liver metastases. 3. Nonspecific portacaval and aortocaval adenopathy, worrisome for metastatic nodes. 4. 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 Additional tiny pulmonary nodules scattered at the right lung base are indeterminate and should be reassessed on follow-up chest CT in 3 months. 5. Mild prostatomegaly. 6.  Aortic Atherosclerosis (ICD10-I70.0). These results will be called to the ordering clinician or representative by the Radiologist Assistant, and communication documented in the PACS or zVision Dashboard. Electronically Signed   By: Ilona Sorrel M.D.   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 EXAM: NUCLEAR MEDICINE PET SKULL BASE TO THIGH TECHNIQUE: 11.0 mCi F-18 FDG was injected intravenously. Full-ring PET imaging was performed from the skull base to thigh after the radiotracer. CT data was obtained and used for attenuation correction and anatomic localization. Fasting blood glucose: 123 mg/dl COMPARISON:  05/16/2019 CT abdomen/pelvis. 05/10/2019 screening chest CT. FINDINGS: Mediastinal blood pool activity: SUV max 3.2 Liver activity: SUV max NA NECK: No hypermetabolic lymph nodes in the neck. Incidental CT findings: none CHEST: Bilateral hypermetabolic hilar foci with max SUV 5.2 on the left and 3.5 on the right. No enlarged or hypermetabolic axillary  or mediastinal lymph nodes. No hypermetabolic pulmonary findings. A few scattered solid right pulmonary nodules, largest 4 mm in the basilar right lower lobe (series 3/image 125), below PET resolution. The pleural based solid density 4.3 x 2.3 cm structure at the extreme medial left lung base (series 3/image 129) is non hypermetabolic. Incidental CT findings: Three-vessel coronary atherosclerosis status post CABG. Atherosclerotic nonaneurysmal thoracic aorta. Dilated main pulmonary artery (3.6 cm diameter). Intact sternotomy wires. ABDOMEN/PELVIS: Intensely hypermetabolic uncinate process pancreatic mass measuring approximately 4.2 x 2.6 cm with max SUV 14.4 (series 3/image 163). Enlarged hypermetabolic 1.4 cm portacaval node with max SUV 10.5 (series 3/image 155). Several small hypermetabolic aortocaval and left para-aortic lymph nodes, largest 1.0 cm in the aortocaval chain with max SUV 6.2 (series 3/image 171). Several (at least 5) hypermetabolic liver masses scattered throughout the liver, largest 3.5 cm in the segment 6 right liver lobe with max SUV 12.6 (series 3/image 152), 2.4 cm in the central segment 5 right liver lobe with max SUV 13.6 (series 3/image 147) and 2.3 cm in posterior right liver lobe adjacent to the IVC with max SUV 10.9 (series 3/image 147). No abnormal hypermetabolic activity within the adrenal glands or spleen. No hypermetabolic lymph nodes in the pelvis. Incidental CT findings: Cholecystectomy. Simple 4.6 cm lower right renal cyst. Simple 1.5 cm interpolar left renal cyst. Atherosclerotic nonaneurysmal abdominal aorta. SKELETON: Faint hypermetabolism in the medial right iliac bone with associated faint sclerosis with max SUV 3.3 (  series 3/image 215). No additional hypermetabolic osseous foci. Incidental CT findings: none IMPRESSION: 1. Intensely hypermetabolic uncinate process pancreatic mass, compatible with primary pancreatic adenocarcinoma. 2. Hypermetabolic portacaval, aortocaval and  left para-aortic nodal metastases. Bilateral hilar nodal hypermetabolism is also compatible with nodal metastatic disease. 3. Several (at least 5) hypermetabolic liver metastases scattered throughout the liver. 4. Equivocal faint hypermetabolism in the medial right iliac bone with associated faint sclerosis, osseous metastasis not excluded, although potentially degenerative. 5. Tiny scattered right pulmonary nodules, below PET resolution, recommend attention on follow-up chest CT in 3-6 months. 6. Chronic findings include: Aortic Atherosclerosis (ICD10-I70.0). Dilated main pulmonary artery, suggesting pulmonary arterial hypertension. Electronically Signed   By: Ilona Sorrel M.D.   On: 06/04/2019 13:56   US Biopsy (liver)  Result Date: 05/21/2019 INDICATION: Multiple liver lesions. EXAM: ULTRASOUND DIRECTED LIVER BIOPSY. MEDICATIONS: None. ANESTHESIA/SEDATION: Moderate (conscious) sedation was employed during this procedure. A total of Versed 4.5 mg and Fentanyl 100 mcg was administered intravenously. Moderate Sedation Time: 37 minutes. The patient's level of consciousness and vital signs were monitored continuously by radiology nursing throughout the procedure under my direct supervision. FLUOROSCOPY TIME:  Fluoroscopy Time: 0 COMPLICATIONS: None immediate. PROCEDURE: After discussing the risks and benefits of this procedure with the patient informed consent was obtained. Abdomen sterilely prepped and draped. Local anesthesia administered 1% lidocaine. IV conscious sedation performed as above. Ultrasound liver biopsy was performed with a 18 gauge needle. Two good samples obtained. No complications. Post procedure patient stable. Discharge instructions given. Follow-up with patient's physician. IMPRESSION: Successful ultrasound directed liver mass biopsy. Electronically Signed   By: Marcello Moores  Register   On: 05/21/2019 09:55   Ct Biopsy  Result Date: 05/24/2019 INDICATION: Probable liver metastasis. EXAM: CT  DIRECTED LIVER MASS BIOPSY MEDICATIONS: None. ANESTHESIA/SEDATION: Moderate (conscious) sedation was employed during this procedure. A total of Versed 3.0 mg and Fentanyl 50.0 mcg was administered intravenously. Moderate Sedation Time: 15 minutes. The patient's level of consciousness and vital signs were monitored continuously by radiology nursing throughout the procedure under my direct supervision. FLUOROSCOPY TIME:  Fluoroscopy Time: 9 COMPLICATIONS: None immediate. PROCEDURE: After discussing the risks and benefits of this procedure with the patient informed consent was obtained local anesthesia administered 1% lidocaine. Under CT guidance CT-directed liver mass biopsy was performed without difficulty. Two 13 mm 18 gauge core samples obtained and sent to pathology. Post biopsy CT revealed no complications. Patient was transferred to specials recovery. Patient was evaluated in specials recovery and noted to be stable. Discharge instructions were given to him and his wife. Follow-up is with oncology. IMPRESSION: Successful CT directed liver mass biopsy. Electronically Signed   By: Marcello Moores  Register   On: 05/24/2019 12:22   Ct Chest Lung Cancer Screening Low Dose Wo Contrast  Result Date: 05/10/2019 CLINICAL DATA:  73 year old male with 38 pack-year history of smoking. Lung cancer screening. EXAM: CT CHEST WITHOUT CONTRAST LOW-DOSE FOR LUNG CANCER SCREENING TECHNIQUE: Multidetector CT imaging of the chest was performed following the standard protocol without IV contrast. COMPARISON:  None. FINDINGS: Cardiovascular: The heart size is normal. No substantial pericardial effusion. Status post CABG. Atherosclerotic calcification is noted in the wall of the thoracic aorta. Mediastinum/Nodes: No mediastinal lymphadenopathy. No evidence for gross hilar lymphadenopathy although assessment is limited by the lack of intravenous contrast on today's study. The esophagus has normal imaging features. There is no axillary  lymphadenopathy. Lungs/Pleura: Numerous tiny pulmonary nodules are seen scattered in both lungs, measuring up to a maximum volume derived equivalent diameter  of 4.4 mm. No overtly suspicious nodule or mass. No pleural effusion. Centrilobular emphsyema noted. Upper Abdomen: Unremarkable. Musculoskeletal: No worrisome lytic or sclerotic osseous abnormality. Centrilobular emphsyema noted. IMPRESSION: 1. Lung-RADS 2, benign appearance or behavior. Continue annual screening with low-dose chest CT without contrast in 12 months. 2.  Aortic Atherosclerois (ICD10-170.0) 3.  Emphysema. ([id number]) Electronically Signed   By: Misty Stanley M.D.   On: 05/10/2019 17:31      ASSESSMENT & PLAN:  1. Pancreatic cancer metastasized to liver ([place])   2. Liver metastasis ([place])   3. Neuropathy   4. Fatigue, unspecified type   5. Encounter for antineoplastic chemotherapy    #Metastatic pancreatic cancer He tolerates chemotherapy with mild to moderate difficulties. Labs are reviewed and discussed with patient.  Counts are stable. Received with cycle 3 D1 Gemcitabine and Abraxane  # Anemia, pre-existing and worsened by chemotherapy. Improved comparing to last visit.  # Transaminitis, improved.  # weight loss, continue follow up with Dietitian. Gained 4 pounds since last visit.  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 The patient knows to call the clinic with any problems questions or concerns.  cc Trinna Post, PA-C   Return of visit: [place], MD, PhD Hematology Oncology [place] at [facility] 07/26/2019

## 2019-07-30 ENCOUNTER — Encounter: Payer: Medicare Other | Admitting: Hospice and Palliative Medicine

## 2019-07-31 ENCOUNTER — Ambulatory Visit (INDEPENDENT_AMBULATORY_CARE_PROVIDER_SITE_OTHER): Payer: Medicare Other | Admitting: Physician Assistant

## 2019-07-31 ENCOUNTER — Other Ambulatory Visit: Payer: Self-pay

## 2019-07-31 ENCOUNTER — Encounter: Payer: Self-pay | Admitting: Physician Assistant

## 2019-07-31 VITALS — BP 101/56 | HR 99 | Temp 96.6°F | Resp 15 | Wt 180.6 lb

## 2019-07-31 DIAGNOSIS — I10 Essential (primary) hypertension: Secondary | ICD-10-CM

## 2019-07-31 DIAGNOSIS — R945 Abnormal results of liver function studies: Secondary | ICD-10-CM

## 2019-07-31 DIAGNOSIS — C259 Malignant neoplasm of pancreas, unspecified: Secondary | ICD-10-CM

## 2019-07-31 DIAGNOSIS — R7303 Prediabetes: Secondary | ICD-10-CM

## 2019-07-31 DIAGNOSIS — C787 Secondary malignant neoplasm of liver and intrahepatic bile duct: Secondary | ICD-10-CM

## 2019-07-31 DIAGNOSIS — R7989 Other specified abnormal findings of blood chemistry: Secondary | ICD-10-CM

## 2019-07-31 NOTE — Progress Notes (Signed)
Patient: Craig [name] Male    DOB: 1946-09-06   73 y.o.   MRN: [id number] Visit Date: 07/31/2019  Today's Provider: Trinna Post, PA-C   Chief Complaint  Patient presents with  . Hypertension   Subjective:     HPI   In the interim, patient has been diagnosed with pancreatic cancer than has metastasized to liver. He is currently going through chemotherapy at the [facility]. Reports decreased energy and low appetite. Has had weight loss. Some of his blood pressure medications like valsartan and HCTZ have been discontinued due to hypotension. He has also been discontinued off his crestor due to elevated liver enzymes.  His wife was taking him to chemo therapy but was not allowed to stay due to [place] so he transports himself now.    Hypertension, follow-up:  BP Readings from Last 3 Encounters:  07/31/19 (!) 101/56  07/26/19 114/70  07/19/19 138/87    He was last seen for hypertension 5 months ago.  BP at that visit was 126/62. Management changes since that visit include decreasing Diovan to 160mg . He reports excellent compliance with treatment. He is having side effects.  He is not exercising. He is not adherent to low salt diet.   Outside blood pressures are n. He is experiencing fatigue and near-syncope and swelling in lower extremity.  Patient denies chest pain, chest pressure/discomfort, claudication, dyspnea, exertional chest pressure/discomfort, irregular heart beat, lower extremity edema, near-syncope, orthopnea, palpitations, paroxysmal nocturnal dyspnea, syncope and tachypnea.   Cardiovascular risk factors include advanced age (older than 3 for men, 14 for women), hypertension and male gender.  Use of agents associated with hypertension: NSAIDS.     Weight trend: stable Wt Readings from Last 3 Encounters:  07/31/19 180 lb 9.6 oz (81.9 kg)  07/26/19 182 lb 12.8 oz (82.9 kg)  07/19/19 178 lb 3.2 oz (80.8 kg)    Current diet: well  balanced  ------------------------------------------------------------------------  Allergies  Allergen Reactions  . Lipitor [Atorvastatin] Other (See Comments)    MYALGIA  . Zetia [Ezetimibe] Other (See Comments)    MYALGIA  . Lisinopril Cough  . Protonix [Pantoprazole] Other (See Comments)    headache  . Spironolactone Other (See Comments)    Breast tenderness     Current Outpatient Medications:  .  aspirin EC 81 MG tablet, Take 81 mg by mouth daily., Disp: , Rfl:  .  carvedilol (COREG) 6.25 MG tablet, Take 6.25 mg by mouth 2 (two) times daily with a meal., Disp: , Rfl: 3 .  diltiazem (CARDIZEM CD) 300 MG 24 hr capsule, TAKE 1 CAPSULE BY MOUTH EVERY DAY, Disp: 90 capsule, Rfl: 1 .  dorzolamide-timolol (COSOPT) 22.3-6.8 MG/ML ophthalmic solution, INSTILL ONE DROP INTO BOTH EYES TWICE DAILY, Disp: , Rfl:  .  ferrous sulfate 325 (65 FE) MG tablet, TAKE 1 TABLET BY MOUTH EVERY DAY WITH BREAKFAST, Disp: 90 tablet, Rfl: 1 .  gabapentin (NEURONTIN) 300 MG capsule, TAKE 2 CAPSULES (600 MG TOTAL) BY MOUTH 3 (THREE) TIMES DAILY., Disp: 540 capsule, Rfl: 0 .  latanoprost (XALATAN) 0.005 % ophthalmic solution, Place 1 drop into both eyes at bedtime. , Disp: , Rfl: 3 .  OMEGA-3 FATTY ACIDS PO, Take 1,200 mg by mouth daily. , Disp: , Rfl:  .  omeprazole (PRILOSEC) 40 MG capsule, TAKE 1 CAPSULE BY MOUTH EVERY DAY, Disp: 90 capsule, Rfl: 0 .  diphenoxylate-atropine (LOMOTIL) 2.5-0.025 MG tablet, Take 1 tablet by mouth 4 (four) times daily as  needed for diarrhea Craig loose stools. (Patient not taking: Reported on 07/31/2019), Disp: 30 tablet, Rfl: 0 .  hydrochlorothiazide (HYDRODIURIL) 25 MG tablet, Take 1 tablet (25 mg total) by mouth daily. (Patient not taking: Reported on 07/31/2019), Disp: 90 tablet, Rfl: 3 .  lidocaine-prilocaine (EMLA) cream, Apply to affected area once (Patient not taking: Reported on 07/31/2019), Disp: 30 g, Rfl: 3 .  magic mouthwash w/lidocaine SOLN, Take 5 mLs by mouth 4  (four) times daily as needed for mouth pain. (Patient not taking: Reported on 07/31/2019), Disp: 480 mL, Rfl: 0 .  Multiple Vitamin (MULTIVITAMIN WITH MINERALS) TABS tablet, Take 1 tablet by mouth daily., Disp: , Rfl:  .  ondansetron (ZOFRAN) 8 MG tablet, Take 1 tablet (8 mg total) by mouth 2 (two) times daily as needed (Nausea Craig vomiting). (Patient not taking: Reported on 07/31/2019), Disp: 30 tablet, Rfl: 1 .  oxyCODONE (ROXICODONE) 5 MG immediate release tablet, Take 1 tablet (5 mg total) by mouth every 8 (eight) hours as needed for severe pain. (Patient not taking: Reported on 07/31/2019), Disp: 30 tablet, Rfl: 0 .  prochlorperazine (COMPAZINE) 10 MG tablet, Take 1 tablet (10 mg total) by mouth every 6 (six) hours as needed (Nausea Craig vomiting). (Patient not taking: Reported on 07/31/2019), Disp: 30 tablet, Rfl: 1 .  rosuvastatin (CRESTOR) 10 MG tablet, TAKE 1 TABLET BY MOUTH EVERY DAY (Patient not taking: Reported on 07/31/2019), Disp: 90 tablet, Rfl: 0 .  sucralfate (CARAFATE) 1 g tablet, Take 1 tablet (1 g total) by mouth 4 (four) times daily -  with meals and at bedtime. (Patient not taking: Reported on 07/31/2019), Disp: 28 tablet, Rfl: 0 .  triamcinolone cream (KENALOG) 0.1 %, APPLY 1 APPLICATION TOPICALLY 2 (TWO) TIMES DAILY. TO EXTERNAL EAR (Patient not taking: Reported on 07/31/2019), Disp: 30 g, Rfl: 0 .  triamcinolone ointment (KENALOG) 0.5 %, Apply 1 application topically 3 (three) times daily. (Patient not taking: Reported on 07/12/2019), Disp: 30 g, Rfl: 0 .  valsartan (DIOVAN) 160 MG tablet, Take 1 tablet (160 mg total) by mouth daily. (Patient not taking: Reported on 07/04/2019), Disp: 180 tablet, Rfl: 3  Review of Systems  Social History   Tobacco Use  . Smoking status: Former Smoker    Packs/day: 0.75    Years: 50.00    Pack years: 37.50    Types: Cigarettes    Quit date: 09/27/2017    Years since quitting: 1.8  . Smokeless tobacco: Former Systems developer    Types: Chew  Substance  Use Topics  . Alcohol use: Not Currently    Comment: stopped drinking before cabg      Objective:   BP (!) 101/56   Pulse 99   Temp (!) 96.6 F (35.9 C) (Oral)   Resp 15   Wt 180 lb 9.6 oz (81.9 kg)   BMI 26.67 kg/m  Vitals:   07/31/19 0853  BP: (!) 101/56  Pulse: 99  Resp: 15  Temp: (!) 96.6 F (35.9 C)  TempSrc: Oral  Weight: 180 lb 9.6 oz (81.9 kg)  Body mass index is 26.67 kg/m.   Physical Exam Constitutional:      Appearance: Normal appearance.     Comments: Thinner and more tired appearing than usual.   Cardiovascular:     Rate and Rhythm: Normal rate and regular rhythm.     Heart sounds: Normal heart sounds.  Pulmonary:     Effort: Pulmonary effort is normal.     Breath sounds: Normal breath sounds.  Skin:    General: Skin is warm and dry.  Neurological:     Mental Status: He is alert and oriented to person, place, and time. Mental status is at baseline.  Psychiatric:        Mood and Affect: Mood normal.        Behavior: Behavior normal.      No results found for any visits on 07/31/19.     Assessment & Plan    1. Abnormal LFTs  His crestor was discontinued due to elevated LFTs. He will continue to hold this medication.  2. Prediabetes  Present but I do not think this needs Craig will need treatment.   3. Pancreatic cancer metastasized to liver [facility])  Followed by oncology and currently undergoing chemotherapy.  4. Hypertension, unspecified type  His valsartan and HCTZ were discontinued due to hypotension. His BP is low normal today and he remains on coreg and diltiazem largely for CAD, THN, and LVH. I think we can leave our follow up open ended as he has many oncology visits and his BP is being managed largely based on his symptoms and any hypotension he may be having at the time. Encouraged to push fluids.   The entirety of the information documented in the History of Present Illness, Review of Systems and Physical Exam were personally  obtained by me. Portions of this information were initially documented by [employer] and reviewed by me for thoroughness and accuracy.   F/u PRN        Trinna Post, PA-C  [facility]

## 2019-07-31 NOTE — Patient Instructions (Signed)

## 2019-08-01 ENCOUNTER — Encounter: Payer: Self-pay | Admitting: Oncology

## 2019-08-01 ENCOUNTER — Other Ambulatory Visit: Payer: Self-pay

## 2019-08-01 NOTE — Progress Notes (Signed)
Pre-visit assessment completed prior to [place] appointment with Dr. Tasia Catchings on 08/02/2019.       Craig [name] states that he very concerned because he is experiencing worsening side effects following his chemo treatments. After his last treatment, he reports worsening neuropathy in his feet ("Now it feels like I'm walking on rubber") and new itching and discoloration in his hands ("looks like I have a big grease spot on my palms"). He also states that he had a "pins & needles" sensation all over his body for 48 hours after chemo and that a large painful "knot" developed on his left knee which resolved after approximately 12 hours.

## 2019-08-02 ENCOUNTER — Inpatient Hospital Stay: Payer: Medicare Other

## 2019-08-02 ENCOUNTER — Other Ambulatory Visit: Payer: Self-pay

## 2019-08-02 ENCOUNTER — Inpatient Hospital Stay (HOSPITAL_BASED_OUTPATIENT_CLINIC_OR_DEPARTMENT_OTHER): Payer: Medicare Other | Admitting: Oncology

## 2019-08-02 ENCOUNTER — Inpatient Hospital Stay (HOSPITAL_BASED_OUTPATIENT_CLINIC_OR_DEPARTMENT_OTHER): Payer: Medicare Other | Admitting: Hospice and Palliative Medicine

## 2019-08-02 ENCOUNTER — Encounter: Payer: Self-pay | Admitting: Oncology

## 2019-08-02 VITALS — BP 124/65 | HR 90 | Temp 97.7°F | Resp 18 | Wt 180.1 lb

## 2019-08-02 DIAGNOSIS — R5383 Other fatigue: Secondary | ICD-10-CM

## 2019-08-02 DIAGNOSIS — C787 Secondary malignant neoplasm of liver and intrahepatic bile duct: Secondary | ICD-10-CM

## 2019-08-02 DIAGNOSIS — G629 Polyneuropathy, unspecified: Secondary | ICD-10-CM | POA: Diagnosis not present

## 2019-08-02 DIAGNOSIS — T451X5A Adverse effect of antineoplastic and immunosuppressive drugs, initial encounter: Secondary | ICD-10-CM

## 2019-08-02 DIAGNOSIS — C259 Malignant neoplasm of pancreas, unspecified: Secondary | ICD-10-CM

## 2019-08-02 DIAGNOSIS — Z5111 Encounter for antineoplastic chemotherapy: Secondary | ICD-10-CM | POA: Diagnosis not present

## 2019-08-02 DIAGNOSIS — Z515 Encounter for palliative care: Secondary | ICD-10-CM

## 2019-08-02 DIAGNOSIS — D6481 Anemia due to antineoplastic chemotherapy: Secondary | ICD-10-CM

## 2019-08-02 LAB — COMPREHENSIVE METABOLIC PANEL
ALT: 95 U/L — ABNORMAL HIGH (ref 0–44)
AST: 56 U/L — ABNORMAL HIGH (ref 15–41)
Albumin: 3.1 g/dL — ABNORMAL LOW (ref 3.5–5.0)
Alkaline Phosphatase: 54 U/L (ref 38–126)
Anion gap: 8 (ref 5–15)
BUN: 10 mg/dL (ref 8–23)
CO2: 21 mmol/L — ABNORMAL LOW (ref 22–32)
Calcium: 9.2 mg/dL (ref 8.9–10.3)
Chloride: 111 mmol/L (ref 98–111)
Creatinine, Ser: 0.92 mg/dL (ref 0.61–1.24)
GFR calc Af Amer: >60 mL/min (ref >60)
GFR calc non Af Amer: >60 mL/min (ref >60)
Glucose, Bld: 159 mg/dL — ABNORMAL HIGH (ref 70–99)
Potassium: 3.6 mmol/L (ref 3.5–5.1)
Sodium: 140 mmol/L (ref 135–145)
Total Bilirubin: 0.6 mg/dL (ref 0.3–1.2)
Total Protein: 6.9 g/dL (ref 6.5–8.1)

## 2019-08-02 LAB — CBC WITH DIFFERENTIAL/PLATELET
Abs Immature Granulocytes: 0.08 10*3/uL — ABNORMAL HIGH (ref 0.00–0.07)
Basophils Absolute: 0 10*3/uL (ref 0.0–0.1)
Basophils Relative: 1 %
Eosinophils Absolute: 0.1 10*3/uL (ref 0.0–0.5)
Eosinophils Relative: 2 %
HCT: 26.5 % — ABNORMAL LOW (ref 39.0–52.0)
Hemoglobin: 8.1 g/dL — ABNORMAL LOW (ref 13.0–17.0)
Immature Granulocytes: 2 %
Lymphocytes Relative: 15 %
Lymphs Abs: 0.6 10*3/uL — ABNORMAL LOW (ref 0.7–4.0)
MCH: 28.5 pg (ref 26.0–34.0)
MCHC: 30.6 g/dL (ref 30.0–36.0)
MCV: 93.3 fL (ref 80.0–100.0)
Monocytes Absolute: 0.4 10*3/uL (ref 0.1–1.0)
Monocytes Relative: 9 %
Neutro Abs: 2.9 10*3/uL (ref 1.7–7.7)
Neutrophils Relative %: 71 %
Platelets: 247 10*3/uL (ref 150–400)
RBC: 2.84 MIL/uL — ABNORMAL LOW (ref 4.22–5.81)
RDW: 18.3 % — ABNORMAL HIGH (ref 11.5–15.5)
WBC: 4.1 10*3/uL (ref 4.0–10.5)
nRBC: 2.2 % — ABNORMAL HIGH (ref 0.0–0.2)

## 2019-08-02 MED ORDER — PACLITAXEL PROTEIN-BOUND CHEMO INJECTION 100 MG
100.0000 mg/m2 | Freq: Once | INTRAVENOUS | Status: AC
  Administered 2019-08-02: 200 mg via INTRAVENOUS
  Filled 2019-08-02: qty 40

## 2019-08-02 MED ORDER — SODIUM CHLORIDE 0.9 % IV SOLN
Freq: Once | INTRAVENOUS | Status: AC
  Administered 2019-08-02: 10:00:00 via INTRAVENOUS
  Filled 2019-08-02: qty 250

## 2019-08-02 MED ORDER — SODIUM CHLORIDE 0.9% FLUSH
10.0000 mL | INTRAVENOUS | Status: DC | PRN
  Administered 2019-08-02: 10 mL via INTRAVENOUS
  Filled 2019-08-02: qty 10

## 2019-08-02 MED ORDER — HEPARIN SOD (PORK) LOCK FLUSH 100 UNIT/ML IV SOLN
500.0000 [IU] | Freq: Once | INTRAVENOUS | Status: AC
  Administered 2019-08-02: 500 [IU] via INTRAVENOUS
  Filled 2019-08-02: qty 5

## 2019-08-02 MED ORDER — SODIUM CHLORIDE 0.9 % IV SOLN
1600.0000 mg | Freq: Once | INTRAVENOUS | Status: AC
  Administered 2019-08-02: 1600 mg via INTRAVENOUS
  Filled 2019-08-02: qty 26.3

## 2019-08-02 MED ORDER — PROCHLORPERAZINE MALEATE 10 MG PO TABS
10.0000 mg | ORAL_TABLET | Freq: Once | ORAL | Status: AC
  Administered 2019-08-02: 10 mg via ORAL
  Filled 2019-08-02: qty 1

## 2019-08-02 NOTE — Progress Notes (Signed)
°Hematology/Oncology  Follow up note °[facility] °[phone] [phone] ° ° °Patient Care Team: °Pollak, Adriana M, PA-C as PCP - General (Physician Assistant) °Stanton, Kristi D, RN as Oncology Nurse Navigator ° °REFERRING PROVIDER: °Pollak, Adriana M, PA-C  °CHIEF COMPLAINTS/REASON FOR VISIT:  °Follow up for treatment of pancreatic cancer ° °HISTORY OF PRESENTING ILLNESS:  ° °Craig [name] is a  73 y.o.  male with PMH listed below, including CABG x5, PVD, hypertension, former tobacco abuse, former alcohol abuse, iron deficiency anemia, was seen in consultation at the request of  Pollak, Adriana M, PA-C  for evaluation of abnormal CT °Patient recently presented to primary care provider complaining for abdominal pain radiating to his back for about 10 days.  Patient uses Tylenol as needed for pain. °Work-up showed elevated amylase, lipase, mild anemia with hemoglobin of 11.3 °Acute hepatitis panel negative. °05/16/2019 CT abdomen pelvis with contrast showed poorly marginated heterogeneous hypodense 2.9 cm pancreatic mass at the uncinate process, worrisome for primary pancreatic cancer.  No biliary or pancreatic duct dilation. °Several ill defined hypodense liver masses scattered throughout the liver, largest 3.1 cm in the segment 6 right liver lobe. °Nonspecific portacaval and aortocaval adenopathy. °Extreme medial left lung base 4.4 cm pleural-based mass probably a pleural metastasis.  Additional tiny pulmonary nodules scattered at the right lung base are indeterminate. °Mild prostate or megaly ° °Patient was referred to heme-onc for further evaluation. °Patient reports that his abdominal pain was 10 out of 10 a few days ago, he uses Tylenol as needed. °Today his pain is getting better, 2 out of 10.  Denies any nausea, vomiting, diarrhea, fever or chills. °Married, lives with wife.  Appetite is good.  He weighs 195 pounds today at the clinic. °His weight was 204 pounds  back in May 2020. ° °He had a history of 37.5-pack-year smoking history quitting 2018. °No longer drinking alcohol. ° °# ultrasound-guided liver biopsy on 05/21/2019.  Pathology showed fragments of benign hepatic parenchyma showing minimal macro vascular steatosis, otherwise no significant histo pathologic change. °Single core of renal tissue showing approximately 25% glomerulosclerosis ° °# #History of CAD, status post CABG x5.  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 ° °# Omniseq test showed PD-L1 50% TPS, TMB high, negative for BRCA1/2 or NTRK fusion.  °MSI could not be completed.  ° °INTERVAL HISTORY °Craig [name] is a 73 y.o. male who has above history reviewed by me today presents for follow up for evaluation prior to first cycle of chemotherapy. °Problems and complaints are listed below: °He reports having "pins and needles" for 2-3 days after treatments, mostly his feet, "feels like walking on rubber". Also developed itching and discoloration in his palms. ° °Denies fever, chills, nausea, vomiting, diarrhea, chest pain, shortness of breath, abdominal pain, urinary symptoms, lower extremity swelling.  °Fatigue: reports worsening fatigue. Chronic onset, perisistent, no aggravating or improving factors, no associated symptoms.  ° ° ° °Review of Systems  °Constitutional: Positive for fatigue and unexpected weight change. Negative for appetite change, chills and fever.  °HENT:   Negative for hearing loss and voice change.   °Eyes: Negative for eye problems and icterus.  °Respiratory: Negative for chest tightness, cough and shortness of breath.   °Cardiovascular: Negative for chest pain and leg swelling.  °Gastrointestinal: Negative for abdominal distention and abdominal pain.  °Endocrine: Negative for hot flashes.  °Genitourinary: Negative for   for difficulty urinating, dysuria and  frequency.   Musculoskeletal: Negative for arthralgias.  Skin: Negative for itching and rash.  Neurological: Positive for numbness. Negative for light-headedness.  Hematological: Negative for adenopathy. Does not bruise/bleed easily.  Psychiatric/Behavioral: Negative for confusion.    MEDICAL HISTORY:  Past Medical History:  Diagnosis Date   Allergic rhinitis    Cancer [facility])    new dx Pancreatic Cancer - Aug 2020   CHF (congestive heart failure) (HCC)    Chronic cholecystitis    Coronary artery disease    DDD (degenerative disc disease), cervical    Dehydration 06/21/2019   Dyspnea    Early cataract    Erectile dysfunction    GERD (gastroesophageal reflux disease)    Gouty arthritis    Headache    occasional migraines   Hypercalcemia    Hyperlipemia    Hypertension    Palpitations    Peripheral vascular disease (HCC)    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   LIMA to LAD SVG to [place] SVG SEQUENTIALLY to OM1 and OM2 SVG to ACUTE MARGINAL   Spinal stenosis of cervical region    Vitamin D deficiency     SURGICAL HISTORY: Past Surgical History:  Procedure Laterality Date   BACK SURGERY  2018    fusion with screws   CHOLECYSTECTOMY N/A 11/13/2018   Procedure: LAPAROSCOPIC CHOLECYSTECTOMY;  Surgeon: Vickie Epley, MD;  Location: [facility];  Service: General;  Laterality: N/A;   COLONOSCOPY     CORONARY ARTERY BYPASS GRAFT N/A 09/30/2017   Procedure: CORONARY ARTERY BYPASS GRAFTING times five using right and left Saphaneous vein harvested endoscopicly  and left internal mammary artery. (CABG),TEE;  Surgeon: Rexene Alberts, MD;  Location: [place];  Service: Open Heart Surgery;  Laterality: N/A;   LEFT HEART CATH AND CORONARY ANGIOGRAPHY Left 09/06/2017   Procedure: LEFT HEART CATH AND CORONARY ANGIOGRAPHY;  Surgeon: Corey Skains, MD;  Location: [place] [facility];  Service: Cardiovascular;  Laterality: Left;   percutaneous transluminal balloon  angioplasty  01/2010   of left lower extremity   PORTA CATH INSERTION N/A 06/06/2019   Procedure: PORTA CATH INSERTION;  Surgeon: Katha Cabal, MD;  Location: [place] [facility];  Service: Cardiovascular;  Laterality: N/A;   TEE WITHOUT CARDIOVERSION N/A 09/30/2017   Procedure: TRANSESOPHAGEAL ECHOCARDIOGRAM (TEE);  Surgeon: Rexene Alberts, MD;  Location: [place];  Service: Open Heart Surgery;  Laterality: N/A;   VASCULAR SURGERY Left 2010   left exernal iliac and superficial femoral artery PTCA and stenting    SOCIAL HISTORY: Social History   Socioeconomic History   Marital status: Married    Spouse name: Enid Derry   Number of children: 2   Years of education: Not on file   Highest education level: Not on file  Occupational History   Occupation: drove tractors    Comment: retired  Scientist, product/process development strain: Not hard at [employer] insecurity    Worry: Never true    Inability: Never true   Transportation needs    Medical: No    Non-medical: No  Tobacco Use   Smoking status: Former Smoker    Packs/day: 0.75    Years: 50.00    Pack years: 37.50    Types: Cigarettes    Quit date: 09/27/2017    Years since quitting: 1.8   Smokeless tobacco: Former Systems developer    Types: Chew  Substance and Sexual Activity   Alcohol use:  Not Currently    Comment: stopped drinking before cabg   Drug use: Not Currently    Types: Marijuana    Comment: stopped smoking before CABG   Sexual activity: Not Currently  Lifestyle   Physical activity    Days per week: 5 days    Minutes per session: 150+ min   Stress: Not at all  Relationships   Social connections    Talks on phone: More than three times a week    Gets together: More than three times a week    Attends religious service: Never    Active member of club or organization: No    Attends meetings of clubs or organizations: Never    Relationship status: Married   Intimate partner violence    Fear of  current or ex partner: Not on file    Emotionally abused: Not on file    Physically abused: Not on file    Forced sexual activity: Not on file  Other Topics Concern   Not on file  Social History Narrative   Not on file    FAMILY HISTORY: Family History  Problem Relation Age of Onset   Brain cancer Mother    Emphysema Father    Cancer Brother     ALLERGIES:  is allergic to lipitor [atorvastatin]; zetia [ezetimibe]; lisinopril; protonix [pantoprazole]; and spironolactone.  MEDICATIONS:  Current Outpatient Medications  Medication Sig Dispense Refill   aspirin EC 81 MG tablet Take 81 mg by mouth daily.     carvedilol (COREG) 6.25 MG tablet Take 6.25 mg by mouth 2 (two) times daily with a meal.  3   diltiazem (CARDIZEM CD) 300 MG 24 hr capsule TAKE 1 CAPSULE BY MOUTH EVERY DAY 90 capsule 1   dorzolamide-timolol (COSOPT) 22.3-6.8 MG/ML ophthalmic solution INSTILL ONE DROP INTO BOTH EYES TWICE DAILY     ferrous sulfate 325 (65 FE) MG tablet TAKE 1 TABLET BY MOUTH EVERY DAY WITH BREAKFAST 90 tablet 1   gabapentin (NEURONTIN) 300 MG capsule TAKE 2 CAPSULES (600 MG TOTAL) BY MOUTH 3 (THREE) TIMES DAILY. 540 capsule 0   latanoprost (XALATAN) 0.005 % ophthalmic solution Place 1 drop into both eyes at bedtime.   3   Multiple Vitamin (MULTIVITAMIN WITH MINERALS) TABS tablet Take 1 tablet by mouth daily.     OMEGA-3 FATTY ACIDS PO Take 1,200 mg by mouth daily.      omeprazole (PRILOSEC) 40 MG capsule TAKE 1 CAPSULE BY MOUTH EVERY DAY 90 capsule 0   No current facility-administered medications for this visit.    Facility-Administered Medications Ordered in Other Visits  Medication Dose Route Frequency Provider Last Rate Last Dose   sodium chloride flush (NS) 0.9 % injection 10 mL  10 mL Intravenous PRN Earlie Server, MD   10 mL at 08/02/19 0834     PHYSICAL EXAMINATION: ECOG PERFORMANCE STATUS: 1 - Symptomatic but completely ambulatory Vitals:   08/02/19 0847  BP: 124/65    Pulse: 90  Resp: 18  Temp: 97.7 F (36.5 C)   Filed Weights   08/02/19 0847  Weight: 180 lb 1.6 oz (81.7 kg)    Physical Exam Constitutional:      General: He is not in acute distress.    Comments: Walk in   HENT:     Head: Normocephalic and atraumatic.  Eyes:     General: No scleral icterus.    Pupils: Pupils are equal, round, and reactive to light.  Neck:     Musculoskeletal: Normal  range of motion and neck supple.  Cardiovascular:     Rate and Rhythm: Normal rate and regular rhythm.     Heart sounds: Normal heart sounds.  Pulmonary:     Effort: Pulmonary effort is normal. No respiratory distress.     Breath sounds: No wheezing.  Abdominal:     General: Bowel sounds are normal. There is no distension.     Palpations: Abdomen is soft. There is no mass.     Tenderness: There is no abdominal tenderness.  Musculoskeletal: Normal range of motion.        General: No deformity.  Skin:    General: Skin is warm and dry.     Findings: No erythema or rash.  Neurological:     General: No focal deficit present.     Mental Status: He is alert and oriented to person, place, and time.     Cranial Nerves: No cranial nerve deficit.     Coordination: Coordination normal.  Psychiatric:        Behavior: Behavior normal.        Thought Content: Thought content normal.     LABORATORY DATA:  I have reviewed the data as listed Lab Results  Component Value Date   WBC 4.1 08/02/2019   HGB 8.1 (L) 08/02/2019   HCT 26.5 (L) 08/02/2019   MCV 93.3 08/02/2019   PLT 247 08/02/2019   Recent Labs    07/19/19 0819 07/26/19 0831 08/02/19 0825  NA 136 136 140  K 4.1 4.0 3.6  CL 106 106 111  CO2 21* 23 21*  GLUCOSE 145* 211* 159*  BUN _0 CREATININE 1.23 0.98 0.92  CALCIUM 9.1 9.0 9.2  GFRNONAA 58* >60 >60  GFRAA >60 >60 >60  PROT 6.9 6.5 6.9  ALBUMIN 3.5 3.3* 3.1*  AST 83* 28 56*  ALT 210* 71* 95*  ALKPHOS 66 64 54  BILITOT 0.5 0.8 0.6   Iron/TIBC/Ferritin/ %Sat     Component Value Date/Time   IRON 119 04/27/2019 0940   TIBC 363 04/27/2019 0940   FERRITIN 58 04/27/2019 0940   IRONPCTSAT 33 04/27/2019 0940      RADIOGRAPHIC STUDIES: I have personally reviewed the radiological images as listed and agreed with the findings in the report. US Abdomen Complete  Result Date: 05/15/2019 CLINICAL DATA:  73 year old male with right upper quadrant abdominal pain. Cholecystectomy in 2019. EXAM: ABDOMEN ULTRASOUND COMPLETE COMPARISON:  Chest CT 05/10/2019. Ultrasound 07/24/2018. Lumbar MRI 01/15/2017. FINDINGS: Gallbladder: Surgically absent. Common bile duct: Diameter: 2 millimeters, normal. Liver: Around hypoechoic lesion in the right lobe on image 15 measures up to 20 millimeters diameter. 3 smaller round hypoechoic lesions are suggested in the left lobe (image 25) and central right lobe (image 26). These were not evident in 2019. Background liver echogenicity is within normal limits. Portal vein is patent on color Doppler imaging with normal direction of blood flow towards the liver. IVC: No abnormality visualized. Pancreas: Incompletely visualized due to overlying bowel gas, visualized portions within normal limits. Spleen: Size and appearance within normal limits. Right Kidney: Length: 11.8 centimeters. Chronic left renal lower pole cyst appears not significantly changed since 2018 and benign with no vascular elements (image 47). No right hydronephrosis. Left Kidney: Length: 12.8 centimeters. Stable small benign appearing midpole cyst since 2018, 19 millimeters. No left hydronephrosis. Abdominal aorta: No aneurysm visualized. Other findings: No free fluid. IMPRESSION: 1. Multiple new round hypoechoic liver lesions which were not evident on the 2019 ultrasound range  8 mm to 20 mm diameter. These are nonspecific but suspicious for hepatic metastatic disease. Given that Chest CT was just recently performed on 05/10/2019, recommend follow-up CT Abdomen and Pelvis with  oral and IV contrast. 2. Otherwise negative abdomen ultrasound status post cholecystectomy. These results will be called to the ordering clinician or representative by the Radiologist Assistant, and communication documented in the PACS or zVision Dashboard. Electronically Signed   By: H  Hall M.D.   On: 05/15/2019 14:02  ° °Ct Abdomen Pelvis W Contrast ° °Result Date: 05/16/2019 °CLINICAL DATA:  Heavy alcohol abuse. Abdominal pain for 10 days. Indeterminate hypoechoic liver lesions on recent sonogram. EXAM: CT ABDOMEN AND PELVIS WITH CONTRAST TECHNIQUE: Multidetector CT imaging of the abdomen and pelvis was performed using the standard protocol following bolus administration of intravenous contrast. CONTRAST:  100mL OMNIPAQUE IOHEXOL 300 MG/ML  SOLN COMPARISON:  05/15/2019 abdominal sonogram. FINDINGS: Lower chest: There is a 4.4 x 2.6 cm pleural based mass at the extreme medial basilar left lung base (series 2/image 12). A few scattered tiny solid pulmonary nodules at the right lung base, largest 4 mm in the right lower lobe (series 3/image 11). Coronary atherosclerosis. Visualized lower sternotomy wires are intact. Hepatobiliary: There are several (at least 8) ill-defined hypodense liver masses scattered throughout the liver, largest 3.1 x 2.7 cm in segment 6 right liver lobe (series 2/image 25) and 1.5 x 1.1 cm in the segment 7 right liver lobe (series 2/image 20). Cholecystectomy. No biliary ductal dilatation. Pancreas: There is a poorly marginated heterogeneous hypodense pancreatic mass at the uncinate process measuring approximately 2.9 x 2.7 cm (series 2/image 31). This mass abuts approximately 30-40% of the circumference of the proximal SMA posteriorly (series 2/image 29). The mass encases a tributary of the SMV (series 2/image 33). No involvement by the mass of the portosplenic venous confluence or main portal vein. No pancreatic duct dilation. Spleen: Normal size. No mass. Adrenals/Urinary Tract: Normal  adrenals. Simple bilateral renal cysts, largest 4.5 cm in the lower right kidney. Scattered subcentimeter hypodense renal cortical lesions in both kidneys are too small to characterize. No hydronephrosis. Normal bladder. Stomach/Bowel: Normal non-distended stomach. Normal caliber small bowel with no small bowel wall thickening. Normal appendix. Normal large bowel with no diverticulosis, large bowel wall thickening or pericolonic fat stranding. Vascular/Lymphatic: Atherosclerotic nonaneurysmal abdominal aorta. Patent portal, splenic, hepatic and renal veins. A few mildly enlarged portacaval nodes, largest 1.4 cm (series 2/image 23). Mildly enlarged 1.0 cm aortocaval node (series 2/image 35). No pelvic adenopathy. Reproductive: Mildly enlarged prostate. Other: No pneumoperitoneum, ascites or focal fluid collection. Musculoskeletal: No aggressive appearing focal osseous lesions. Status post posterior spinal fixation bilaterally at L4-5. Moderate thoracolumbar spondylosis. IMPRESSION: 1. Poorly marginated heterogeneous hypodense 2.9 cm pancreatic mass at the uncinate process, worrisome for primary pancreatic adenocarcinoma. No biliary or pancreatic duct dilation. MRI abdomen without and with IV contrast is indicated for further evaluation. 2. Several ill-defined hypodense liver masses scattered throughout the liver, largest 3.1 cm in the segment 6 right liver lobe, worrisome for liver metastases. 3. Nonspecific portacaval and aortocaval adenopathy, worrisome for metastatic nodes. 4. 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 Additional tiny pulmonary nodules scattered at the right lung base are indeterminate and should be reassessed on follow-up chest CT in 3 months. 5. Mild prostatomegaly. 6.  Aortic Atherosclerosis (ICD10-I70.0). These results will be called to the ordering clinician or representative by the Radiologist Assistant, and communication documented in the PACS   or  zVision Dashboard. Electronically Signed   By: Jason A Poff M.D.   On: 05/16/2019 15:13  ° °Nm Pet Image Initial (pi) Skull Base To Thigh ° °Result Date: 06/04/2019 °CLINICAL DATA:  Initial treatment strategy for stage IV pancreatic cancer with biopsy confirmed liver metastasis. EXAM: NUCLEAR MEDICINE PET SKULL BASE TO THIGH TECHNIQUE: 11.0 mCi F-18 FDG was injected intravenously. Full-ring PET imaging was performed from the skull base to thigh after the radiotracer. CT data was obtained and used for attenuation correction and anatomic localization. Fasting blood glucose: 123 mg/dl COMPARISON:  05/16/2019 CT abdomen/pelvis. 05/10/2019 screening chest CT. FINDINGS: Mediastinal blood pool activity: SUV max 3.2 Liver activity: SUV max NA NECK: No hypermetabolic lymph nodes in the neck. Incidental CT findings: none CHEST: Bilateral hypermetabolic hilar foci with max SUV 5.2 on the left and 3.5 on the right. No enlarged or hypermetabolic axillary or mediastinal lymph nodes. No hypermetabolic pulmonary findings. A few scattered solid right pulmonary nodules, largest 4 mm in the basilar right lower lobe (series 3/image 125), below PET resolution. The pleural based solid density 4.3 x 2.3 cm structure at the extreme medial left lung base (series 3/image 129) is non hypermetabolic. Incidental CT findings: Three-vessel coronary atherosclerosis status post CABG. Atherosclerotic nonaneurysmal thoracic aorta. Dilated main pulmonary artery (3.6 cm diameter). Intact sternotomy wires. ABDOMEN/PELVIS: Intensely hypermetabolic uncinate process pancreatic mass measuring approximately 4.2 x 2.6 cm with max SUV 14.4 (series 3/image 163). Enlarged hypermetabolic 1.4 cm portacaval node with max SUV 10.5 (series 3/image 155). Several small hypermetabolic aortocaval and left para-aortic lymph nodes, largest 1.0 cm in the aortocaval chain with max SUV 6.2 (series 3/image 171). Several (at least 5) hypermetabolic liver masses scattered  throughout the liver, largest 3.5 cm in the segment 6 right liver lobe with max SUV 12.6 (series 3/image 152), 2.4 cm in the central segment 5 right liver lobe with max SUV 13.6 (series 3/image 147) and 2.3 cm in posterior right liver lobe adjacent to the IVC with max SUV 10.9 (series 3/image 147). No abnormal hypermetabolic activity within the adrenal glands or spleen. No hypermetabolic lymph nodes in the pelvis. Incidental CT findings: Cholecystectomy. Simple 4.6 cm lower right renal cyst. Simple 1.5 cm interpolar left renal cyst. Atherosclerotic nonaneurysmal abdominal aorta. SKELETON: Faint hypermetabolism in the medial right iliac bone with associated faint sclerosis with max SUV 3.3 (series 3/image 215). No additional hypermetabolic osseous foci. Incidental CT findings: none IMPRESSION: 1. Intensely hypermetabolic uncinate process pancreatic mass, compatible with primary pancreatic adenocarcinoma. 2. Hypermetabolic portacaval, aortocaval and left para-aortic nodal metastases. Bilateral hilar nodal hypermetabolism is also compatible with nodal metastatic disease. 3. Several (at least 5) hypermetabolic liver metastases scattered throughout the liver. 4. Equivocal faint hypermetabolism in the medial right iliac bone with associated faint sclerosis, osseous metastasis not excluded, although potentially degenerative. 5. Tiny scattered right pulmonary nodules, below PET resolution, recommend attention on follow-up chest CT in 3-6 months. 6. Chronic findings include: Aortic Atherosclerosis (ICD10-I70.0). Dilated main pulmonary artery, suggesting pulmonary arterial hypertension. Electronically Signed   By: Jason A Poff M.D.   On: 06/04/2019 13:56  ° °Us Biopsy (liver) ° °Result Date: 05/21/2019 °INDICATION: Multiple liver lesions. EXAM: ULTRASOUND DIRECTED LIVER BIOPSY. MEDICATIONS: None. ANESTHESIA/SEDATION: Moderate (conscious) sedation was employed during this procedure. A total of Versed 4.5 mg and Fentanyl 100 mcg  was administered intravenously. Moderate Sedation Time: 37 minutes. The patient's level of consciousness and vital signs were monitored continuously by radiology nursing throughout the procedure under my direct supervision. FLUOROSCOPY   FLUOROSCOPY TIME:  Fluoroscopy Time: 0 COMPLICATIONS: None immediate. PROCEDURE: After discussing the risks and benefits of this procedure with the patient informed consent was obtained. Abdomen sterilely prepped and draped. Local anesthesia administered 1% lidocaine. IV conscious sedation performed as above. Ultrasound liver biopsy was performed with a 18 gauge needle. Two good samples obtained. No complications. Post procedure patient stable. Discharge instructions given. Follow-up with patient's physician. IMPRESSION: Successful ultrasound directed liver mass biopsy. Electronically Signed   By: Marcello Moores  Register   On: 05/21/2019 09:55   Ct Biopsy  Result Date: 05/24/2019 INDICATION: Probable liver metastasis. EXAM: CT DIRECTED LIVER MASS BIOPSY MEDICATIONS: None. ANESTHESIA/SEDATION: Moderate (conscious) sedation was employed during this procedure. A total of Versed 3.0 mg and Fentanyl 50.0 mcg was administered intravenously. Moderate Sedation Time: 15 minutes. The patient's level of consciousness and vital signs were monitored continuously by radiology nursing throughout the procedure under my direct supervision. FLUOROSCOPY TIME:  Fluoroscopy Time: 9 COMPLICATIONS: None immediate. PROCEDURE: After discussing the risks and benefits of this procedure with the patient informed consent was obtained local anesthesia administered 1% lidocaine. Under CT guidance CT-directed liver mass biopsy was performed without difficulty. Two 13 mm 18 gauge core samples obtained and sent to pathology. Post biopsy CT revealed no complications. Patient was transferred to specials recovery. Patient was evaluated in specials recovery and noted to be stable. Discharge instructions were given to him and his wife.  Follow-up is with oncology. IMPRESSION: Successful CT directed liver mass biopsy. Electronically Signed   By: Marcello Moores  Register   On: 05/24/2019 12:22   Ct Chest Lung Cancer Screening Low Dose Wo Contrast  Result Date: 05/10/2019 CLINICAL DATA:  73 year old male with 38 pack-year history of smoking. Lung cancer screening. EXAM: CT CHEST WITHOUT CONTRAST LOW-DOSE FOR LUNG CANCER SCREENING TECHNIQUE: Multidetector CT imaging of the chest was performed following the standard protocol without IV contrast. COMPARISON:  None. FINDINGS: Cardiovascular: The heart size is normal. No substantial pericardial effusion. Status post CABG. Atherosclerotic calcification is noted in the wall of the thoracic aorta. Mediastinum/Nodes: No mediastinal lymphadenopathy. No evidence for gross hilar lymphadenopathy although assessment is limited by the lack of intravenous contrast on today's study. The esophagus has normal imaging features. There is no axillary lymphadenopathy. Lungs/Pleura: Numerous tiny pulmonary nodules are seen scattered in both lungs, measuring up to a maximum volume derived equivalent diameter of 4.4 mm. No overtly suspicious nodule or mass. No pleural effusion. Centrilobular emphsyema noted. Upper Abdomen: Unremarkable. Musculoskeletal: No worrisome lytic or sclerotic osseous abnormality. Centrilobular emphsyema noted. IMPRESSION: 1. Lung-RADS 2, benign appearance or behavior. Continue annual screening with low-dose chest CT without contrast in 12 months. 2.  Aortic Atherosclerois (ICD10-170.0) 3.  Emphysema. (RAQ76-A26.9) Electronically Signed   By: Misty Stanley M.D.   On: 05/10/2019 17:31      ASSESSMENT & PLAN:  1. Pancreatic cancer metastasized to liver ([place])   2. Liver metastasis ([place])   3. Neuropathy   4. Fatigue, unspecified type   5. Encounter for antineoplastic chemotherapy   6. Anemia due to antineoplastic chemotherapy    #Metastatic pancreatic cancer Labs are reviewed and discussed with  patient. Counts are acceptable to proceed with cycle 3 D8 gemcitabine and Abraxane.  CA 19.9 is trending down, indicating good response.   #pre existing Neuropathy, worsened.  Continue gabapentin 638m TID. Close monitoring. Intermittent.  If further worsening, will need to dose decrease abraxane.   # Anemia, pre-existing and worsened by chemotherapy. Anticipate that anemia will get worse after today's  Discussed with him about repeat Cbc next week for possible blood transfusion and he agreed.  ° °#Fatigue, multifactorial, chemotherapy, anemia, malignancy.   ° °Will need to refer to genetic testing ° °All questions were answered. The patient knows to call the clinic with any problems questions or concerns. ° °cc °Pollak, Adriana M, PA-C  ° °Return of visit: 1 WEEK to repeat blood work and possible blood transfusion.  2 weeks follow-up for next cycle of treatments. ° °Zhou Yu, MD, PhD °Hematology Oncology °[facility] °08/02/2019 ° °

## 2019-08-02 NOTE — Progress Notes (Signed)
[place]  [phone] [phone]   Name: Craig [name] Date: 08/02/2019 MRN: [id number]  DOB: [birth year]  Patient Care Team: Paulene Floor as PCP - General (Physician Assistant) Clent Jacks, RN as Oncology Nurse Navigator    REASON FOR CONSULTATION: Palliative Care consult requested for this 73 y.o. 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  Patient is being initiated on chemotherapy with gemcitabine and Abraxane.  He was referred to palliative care to help address goals and manage ongoing symptoms.   SOCIAL HISTORY:     reports that he quit smoking about 22 months ago. His smoking use included cigarettes. He has a 37.50 pack-year smoking history. He has quit using smokeless tobacco.  His smokeless tobacco use included chew. He reports previous alcohol use. He reports previous drug use. Drug: Marijuana.   Patient is married and lives at home with his wife.  He has a son and a daughter but reports that he is estranged from both.  He had another daughter who is now deceased.  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  Note that patient is illiterate.  He also says that his wife often will not answer the phone and recommends that we call and leave a message on the answering machine and then call her right back.  ADVANCE DIRECTIVES:  Does not have  CODE STATUS:   PAST MEDICAL HISTORY: Past Medical History:  Diagnosis Date  . Allergic rhinitis   . Cancer [facility])    new dx Pancreatic Cancer - Aug 2020  . CHF (congestive heart failure) ([place])   . Chronic cholecystitis   . Coronary artery disease   . DDD (degenerative disc disease), cervical   . Dehydration 06/21/2019  . Dyspnea   . Early cataract   . Erectile  dysfunction   . GERD (gastroesophageal reflux disease)   . Gouty arthritis   . Headache    occasional migraines  . Hypercalcemia   . Hyperlipemia   . Hypertension   . Palpitations   . Peripheral vascular disease ([place])   . 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   LIMA to LAD SVG to [place] SVG SEQUENTIALLY to OM1 and OM2 SVG to ACUTE MARGINAL  . Spinal stenosis of cervical region   . Vitamin D deficiency     PAST SURGICAL HISTORY:  Past Surgical History:  Procedure Laterality Date  . BACK SURGERY  2018    fusion with screws  . CHOLECYSTECTOMY N/A 11/13/2018   Procedure: LAPAROSCOPIC CHOLECYSTECTOMY;  Surgeon: Vickie Epley, MD;  Location: [facility] ORS;  Service: General;  Laterality: N/A;  . COLONOSCOPY    . CORONARY ARTERY BYPASS GRAFT N/A 09/30/2017   Procedure: CORONARY ARTERY BYPASS GRAFTING times five using right and left Saphaneous vein harvested endoscopicly  and left internal mammary artery. (CABG),TEE;  Surgeon: Rexene Alberts, MD;  Location: [place];  Service: Open Heart Surgery;  Laterality: N/A;  . LEFT HEART CATH AND CORONARY ANGIOGRAPHY Left 09/06/2017   Procedure: LEFT HEART CATH AND CORONARY ANGIOGRAPHY;  Surgeon: Corey Skains, MD;  Location: [place] [facility];  Service: Cardiovascular;  Laterality: Left;  . percutaneous transluminal balloon angioplasty  01/2010   of left lower extremity  . PORTA CATH INSERTION N/A 06/06/2019  Procedure: PORTA CATH INSERTION;  Surgeon: Katha Cabal, MD;  Location: [place] CV [facility];  Service: Cardiovascular;  Laterality: N/A;  . TEE WITHOUT CARDIOVERSION N/A 09/30/2017   Procedure: TRANSESOPHAGEAL ECHOCARDIOGRAM (TEE);  Surgeon: Rexene Alberts, MD;  Location: [place];  Service: Open Heart Surgery;  Laterality: N/A;  . VASCULAR SURGERY Left 2010   left exernal iliac and superficial femoral artery PTCA and stenting    HEMATOLOGY/ONCOLOGY HISTORY:  Oncology History  Pancreatic cancer metastasized to liver ([place])   06/01/2019 Initial Diagnosis   Pancreatic cancer metastasized to liver ([place])   06/07/2019 -  Chemotherapy   The patient had PACLitaxel-protein bound (ABRAXANE) chemo infusion 250 mg, 125 mg/m2 = 250 mg (100 % of original dose 125 mg/m2), Intravenous,  Once, 3 of 6 cycles Dose modification: 100 mg/m2 (original dose 125 mg/m2, Cycle 1, Reason: Provider Judgment), 125 mg/m2 (original dose 125 mg/m2, Cycle 1, Reason: Provider Judgment), 100 mg/m2 (original dose 125 mg/m2, Cycle 5, Reason: Dose not tolerated, Comment: neuropathy), 90 mg/m2 (original dose 125 mg/m2, Cycle 5, Reason: Dose not tolerated, Comment: neuropathy), 100 mg/m2 (original dose 125 mg/m2, Cycle 5, Reason: Dose not tolerated) Administration: 250 mg (06/07/2019), 250 mg (06/14/2019), 250 mg (06/21/2019), 200 mg (07/05/2019), 200 mg (07/12/2019), 200 mg (07/26/2019) gemcitabine (GEMZAR) 2,000 mg in sodium chloride 0.9 % 250 mL chemo infusion, 2,090 mg, Intravenous,  Once, 3 of 6 cycles Dose modification: 800 mg/m2 (original dose 1,000 mg/m2, Cycle 5, Reason: Dose not tolerated) Administration: 2,000 mg (06/07/2019), 2,000 mg (06/14/2019), 2,000 mg (07/05/2019), 1,600 mg (07/12/2019), 1,600 mg (07/26/2019)  for chemotherapy treatment.      ALLERGIES:  is allergic to lipitor [atorvastatin]; zetia [ezetimibe]; lisinopril; protonix [pantoprazole]; and spironolactone.  MEDICATIONS:  Current Outpatient Medications  Medication Sig Dispense Refill  . aspirin EC 81 MG tablet Take 81 mg by mouth daily.    . carvedilol (COREG) 6.25 MG tablet Take 6.25 mg by mouth 2 (two) times daily with a meal.  3  . diltiazem (CARDIZEM CD) 300 MG 24 hr capsule TAKE 1 CAPSULE BY MOUTH EVERY DAY 90 capsule 1  . dorzolamide-timolol (COSOPT) 22.3-6.8 MG/ML ophthalmic solution INSTILL ONE DROP INTO BOTH EYES TWICE DAILY    . ferrous sulfate 325 (65 FE) MG tablet TAKE 1 TABLET BY MOUTH EVERY DAY WITH BREAKFAST 90 tablet 1  . gabapentin (NEURONTIN) 300 MG capsule TAKE 2  CAPSULES (600 MG TOTAL) BY MOUTH 3 (THREE) TIMES DAILY. 540 capsule 0  . latanoprost (XALATAN) 0.005 % ophthalmic solution Place 1 drop into both eyes at bedtime.   3  . Multiple Vitamin (MULTIVITAMIN WITH MINERALS) TABS tablet Take 1 tablet by mouth daily.    . OMEGA-3 FATTY ACIDS PO Take 1,200 mg by mouth daily.     [name] Kitchen omeprazole (PRILOSEC) 40 MG capsule TAKE 1 CAPSULE BY MOUTH EVERY DAY 90 capsule 0   No current facility-administered medications for this visit.    Facility-Administered Medications Ordered in Other Visits  Medication Dose Route Frequency Provider Last Rate Last Dose  . sodium chloride flush (NS) 0.9 % injection 10 mL  10 mL Intravenous PRN Earlie Server, MD   10 mL at 08/02/19 [id number]    VITAL SIGNS: There were no vitals taken for this visit. There were no vitals filed for this visit.  Estimated body mass index is 26.6 kg/m as calculated from the following:   Height as of 06/12/19: 5\' 9"  (1.753 m).   Weight as of an earlier encounter on 08/02/19: 180  lb 1.6 oz (81.7 kg).  LABS: CBC:    Component Value Date/Time   WBC 4.1 08/02/2019 0825   HGB 8.1 (L) 08/02/2019 0825   HGB 11.2 (L) 05/15/2019 1111   HCT 26.5 (L) 08/02/2019 0825   HCT 32.7 (L) 05/15/2019 1111   PLT 247 08/02/2019 0825   PLT 277 05/15/2019 1111   MCV 93.3 08/02/2019 0825   MCV 80 05/15/2019 1111   NEUTROABS 2.9 08/02/2019 0825   NEUTROABS 3.5 05/15/2019 1111   LYMPHSABS 0.6 (L) 08/02/2019 0825   LYMPHSABS 1.2 05/15/2019 1111   MONOABS 0.4 08/02/2019 0825   EOSABS 0.1 08/02/2019 0825   EOSABS 0.2 05/15/2019 1111   BASOSABS 0.0 08/02/2019 0825   BASOSABS 0.1 05/15/2019 1111   Comprehensive Metabolic Panel:    Component Value Date/Time   NA 140 08/02/2019 0825   NA 139 05/15/2019 1111   K 3.6 08/02/2019 0825   CL 111 08/02/2019 0825   CO2 21 (L) 08/02/2019 0825   BUN 10 08/02/2019 0825   BUN 23 05/15/2019 1111   CREATININE 0.92 08/02/2019 0825   CREATININE 0.95 06/17/2017 1035   GLUCOSE 159  (H) 08/02/2019 0825   CALCIUM 9.2 08/02/2019 0825   AST 56 (H) 08/02/2019 0825   ALT 95 (H) 08/02/2019 0825   ALKPHOS 54 08/02/2019 0825   BILITOT 0.6 08/02/2019 0825   BILITOT <0.2 05/15/2019 1111   PROT 6.9 08/02/2019 0825   PROT 6.8 05/15/2019 1111   ALBUMIN 3.1 (L) 08/02/2019 0825   ALBUMIN 4.3 05/15/2019 1111    RADIOGRAPHIC STUDIES: No results found.  PERFORMANCE STATUS (ECOG) : 0 - Asymptomatic  Review of Systems Unless otherwise noted, a complete review of systems is negative.  Physical Exam General: NAD, frail appearing, thin Pulmonary: unlabored Extremities: no edema, no joint deformities Skin: no rashes Neurological: Weakness but otherwise nonfocal  IMPRESSION: Routine follow-up visit made today.  Patient was seen in the infusion area while he was receiving treatment.  Patient reports that he is doing reasonably well.  He denied any acute changes or concerns.  He denies any distressing symptoms at present.  He reports that appetite is good.  Weight appears stable to 180 pounds.  No changes in performance status.  Patient did say that he had swelling to the knee recently and impaired his mobility for about a day.  He says that he has had that occur in the past and of had to have fluid drawn off the knee.  Discussed referral to Ortho but patient declined.  We will plan to have a more in-depth conversation regarding goals, decision-making, and advance care planning when patient can be seen in a private exam room.  PLAN: -Continue current scope of treatment -MOST Form previously discussed -RTC in 1 month   Patient expressed understanding and was in agreement with this plan. He also understands that He can call the clinic at any time with any questions, concerns, or complaints.     Time Total: 15 minutes  Visit consisted of counseling and education dealing with the complex and emotionally intense issues of symptom management and palliative care in the setting of  serious and potentially life-threatening illness.Greater than 50%  of this time was spent counseling and coordinating care related to the above assessment and plan.  Signed by: Altha Harm, PhD, NP-C [phone] (Work Cell)

## 2019-08-03 LAB — CANCER ANTIGEN 19-9: CA 19-9: 253 U/mL — ABNORMAL HIGH (ref 0–35)

## 2019-08-06 ENCOUNTER — Other Ambulatory Visit: Payer: Self-pay

## 2019-08-07 ENCOUNTER — Other Ambulatory Visit: Payer: Self-pay

## 2019-08-07 ENCOUNTER — Other Ambulatory Visit: Payer: Self-pay | Admitting: Oncology

## 2019-08-07 ENCOUNTER — Inpatient Hospital Stay: Payer: Medicare Other

## 2019-08-07 DIAGNOSIS — D6481 Anemia due to antineoplastic chemotherapy: Secondary | ICD-10-CM

## 2019-08-07 DIAGNOSIS — D649 Anemia, unspecified: Secondary | ICD-10-CM

## 2019-08-07 DIAGNOSIS — Z95828 Presence of other vascular implants and grafts: Secondary | ICD-10-CM

## 2019-08-07 DIAGNOSIS — T451X5A Adverse effect of antineoplastic and immunosuppressive drugs, initial encounter: Secondary | ICD-10-CM

## 2019-08-07 DIAGNOSIS — Z5111 Encounter for antineoplastic chemotherapy: Secondary | ICD-10-CM | POA: Diagnosis not present

## 2019-08-07 LAB — CBC WITH DIFFERENTIAL/PLATELET
Abs Immature Granulocytes: 0 10*3/uL (ref 0.00–0.07)
Band Neutrophils: 5 %
Basophils Absolute: 0 10*3/uL (ref 0.0–0.1)
Basophils Relative: 1 %
Blasts: 0 %
Eosinophils Absolute: 0 10*3/uL (ref 0.0–0.5)
Eosinophils Relative: 2 %
HCT: 24.1 % — ABNORMAL LOW (ref 39.0–52.0)
Hemoglobin: 7.3 g/dL — ABNORMAL LOW (ref 13.0–17.0)
Lymphocytes Relative: 15 %
Lymphs Abs: 0.2 10*3/uL — ABNORMAL LOW (ref 0.7–4.0)
MCH: 28.2 pg (ref 26.0–34.0)
MCHC: 30.3 g/dL (ref 30.0–36.0)
MCV: 93.1 fL (ref 80.0–100.0)
Metamyelocytes Relative: 0 %
Monocytes Absolute: 0 10*3/uL — ABNORMAL LOW (ref 0.1–1.0)
Monocytes Relative: 2 %
Myelocytes: 0 %
Neutro Abs: 1.2 10*3/uL — ABNORMAL LOW (ref 1.7–7.7)
Neutrophils Relative %: 75 %
Other: 0 %
Platelets: 157 10*3/uL (ref 150–400)
Promyelocytes Relative: 0 %
RBC: 2.59 MIL/uL — ABNORMAL LOW (ref 4.22–5.81)
RDW: 18.1 % — ABNORMAL HIGH (ref 11.5–15.5)
Smear Review: ADEQUATE
WBC: 1.4 10*3/uL — CL (ref 4.0–10.5)
nRBC: 0 % (ref 0.0–0.2)
nRBC: 0 /100 WBC

## 2019-08-07 LAB — PREPARE RBC (CROSSMATCH)

## 2019-08-07 LAB — SAMPLE TO BLOOD BANK

## 2019-08-07 LAB — ABO/RH: ABO/RH(D): A POS

## 2019-08-07 MED ORDER — SODIUM CHLORIDE 0.9% FLUSH
10.0000 mL | Freq: Once | INTRAVENOUS | Status: AC
  Administered 2019-08-07: 10 mL via INTRAVENOUS
  Filled 2019-08-07: qty 10

## 2019-08-07 MED ORDER — SODIUM CHLORIDE 0.9% FLUSH
10.0000 mL | INTRAVENOUS | Status: AC | PRN
  Filled 2019-08-07: qty 10

## 2019-08-07 MED ORDER — SODIUM CHLORIDE 0.9% IV SOLUTION
250.0000 mL | Freq: Once | INTRAVENOUS | Status: AC
  Administered 2019-08-07: 12:00:00 250 mL via INTRAVENOUS
  Filled 2019-08-07: qty 250

## 2019-08-07 MED ORDER — DIPHENHYDRAMINE HCL 25 MG PO CAPS
25.0000 mg | ORAL_CAPSULE | Freq: Once | ORAL | Status: AC
  Administered 2019-08-07: 25 mg via ORAL
  Filled 2019-08-07: qty 1

## 2019-08-07 MED ORDER — HEPARIN SOD (PORK) LOCK FLUSH 100 UNIT/ML IV SOLN
500.0000 [IU] | Freq: Every day | INTRAVENOUS | Status: AC | PRN
  Administered 2019-08-07: 500 [IU]
  Filled 2019-08-07: qty 5

## 2019-08-07 MED ORDER — ACETAMINOPHEN 325 MG PO TABS
650.0000 mg | ORAL_TABLET | Freq: Once | ORAL | Status: AC
  Administered 2019-08-07: 12:00:00 650 mg via ORAL
  Filled 2019-08-07: qty 2

## 2019-08-08 LAB — BPAM RBC
Blood Product Expiration Date: 202011032359
ISSUE DATE / TIME: 202010271148
Unit Type and Rh: 9500

## 2019-08-08 LAB — TYPE AND SCREEN
ABO/RH(D): A POS
Antibody Screen: NEGATIVE
Unit division: 0

## 2019-08-11 ENCOUNTER — Other Ambulatory Visit: Payer: Self-pay | Admitting: Physician Assistant

## 2019-08-11 DIAGNOSIS — K219 Gastro-esophageal reflux disease without esophagitis: Secondary | ICD-10-CM

## 2019-08-13 NOTE — Telephone Encounter (Signed)
L.O.V. was on 07/31/2019

## 2019-08-15 ENCOUNTER — Encounter: Payer: Self-pay | Admitting: Oncology

## 2019-08-15 ENCOUNTER — Other Ambulatory Visit: Payer: Self-pay

## 2019-08-15 NOTE — Progress Notes (Signed)
Patient prescreened for appointment. No concerns voiced. Denies pain.  

## 2019-08-16 ENCOUNTER — Inpatient Hospital Stay: Payer: Medicare Other | Admitting: Hospice and Palliative Medicine

## 2019-08-16 ENCOUNTER — Ambulatory Visit: Payer: Medicare Other | Admitting: Hospice and Palliative Medicine

## 2019-08-16 ENCOUNTER — Inpatient Hospital Stay: Payer: Medicare Other | Attending: Oncology

## 2019-08-16 ENCOUNTER — Other Ambulatory Visit: Payer: Self-pay

## 2019-08-16 ENCOUNTER — Inpatient Hospital Stay (HOSPITAL_BASED_OUTPATIENT_CLINIC_OR_DEPARTMENT_OTHER): Payer: Medicare Other | Admitting: Oncology

## 2019-08-16 ENCOUNTER — Encounter: Payer: Self-pay | Admitting: Oncology

## 2019-08-16 ENCOUNTER — Inpatient Hospital Stay: Payer: Medicare Other

## 2019-08-16 VITALS — BP 139/70 | HR 99 | Temp 98.9°F | Resp 18 | Wt 182.3 lb

## 2019-08-16 DIAGNOSIS — D6481 Anemia due to antineoplastic chemotherapy: Secondary | ICD-10-CM | POA: Diagnosis not present

## 2019-08-16 DIAGNOSIS — G629 Polyneuropathy, unspecified: Secondary | ICD-10-CM | POA: Insufficient documentation

## 2019-08-16 DIAGNOSIS — I739 Peripheral vascular disease, unspecified: Secondary | ICD-10-CM | POA: Diagnosis not present

## 2019-08-16 DIAGNOSIS — C787 Secondary malignant neoplasm of liver and intrahepatic bile duct: Secondary | ICD-10-CM | POA: Insufficient documentation

## 2019-08-16 DIAGNOSIS — R599 Enlarged lymph nodes, unspecified: Secondary | ICD-10-CM | POA: Insufficient documentation

## 2019-08-16 DIAGNOSIS — C778 Secondary and unspecified malignant neoplasm of lymph nodes of multiple regions: Secondary | ICD-10-CM | POA: Diagnosis not present

## 2019-08-16 DIAGNOSIS — R5383 Other fatigue: Secondary | ICD-10-CM | POA: Insufficient documentation

## 2019-08-16 DIAGNOSIS — C259 Malignant neoplasm of pancreas, unspecified: Secondary | ICD-10-CM

## 2019-08-16 DIAGNOSIS — T451X5A Adverse effect of antineoplastic and immunosuppressive drugs, initial encounter: Secondary | ICD-10-CM | POA: Insufficient documentation

## 2019-08-16 DIAGNOSIS — Z66 Do not resuscitate: Secondary | ICD-10-CM | POA: Diagnosis not present

## 2019-08-16 DIAGNOSIS — R55 Syncope and collapse: Secondary | ICD-10-CM | POA: Insufficient documentation

## 2019-08-16 DIAGNOSIS — I2510 Atherosclerotic heart disease of native coronary artery without angina pectoris: Secondary | ICD-10-CM | POA: Diagnosis not present

## 2019-08-16 DIAGNOSIS — K219 Gastro-esophageal reflux disease without esophagitis: Secondary | ICD-10-CM | POA: Insufficient documentation

## 2019-08-16 DIAGNOSIS — Z808 Family history of malignant neoplasm of other organs or systems: Secondary | ICD-10-CM | POA: Insufficient documentation

## 2019-08-16 DIAGNOSIS — R918 Other nonspecific abnormal finding of lung field: Secondary | ICD-10-CM | POA: Insufficient documentation

## 2019-08-16 DIAGNOSIS — Z836 Family history of other diseases of the respiratory system: Secondary | ICD-10-CM | POA: Insufficient documentation

## 2019-08-16 DIAGNOSIS — E785 Hyperlipidemia, unspecified: Secondary | ICD-10-CM | POA: Insufficient documentation

## 2019-08-16 DIAGNOSIS — Z5111 Encounter for antineoplastic chemotherapy: Secondary | ICD-10-CM | POA: Diagnosis not present

## 2019-08-16 DIAGNOSIS — R160 Hepatomegaly, not elsewhere classified: Secondary | ICD-10-CM | POA: Insufficient documentation

## 2019-08-16 DIAGNOSIS — J984 Other disorders of lung: Secondary | ICD-10-CM | POA: Insufficient documentation

## 2019-08-16 DIAGNOSIS — R109 Unspecified abdominal pain: Secondary | ICD-10-CM | POA: Insufficient documentation

## 2019-08-16 DIAGNOSIS — I110 Hypertensive heart disease with heart failure: Secondary | ICD-10-CM | POA: Insufficient documentation

## 2019-08-16 DIAGNOSIS — N281 Cyst of kidney, acquired: Secondary | ICD-10-CM | POA: Diagnosis not present

## 2019-08-16 DIAGNOSIS — Z79899 Other long term (current) drug therapy: Secondary | ICD-10-CM | POA: Diagnosis not present

## 2019-08-16 DIAGNOSIS — C7951 Secondary malignant neoplasm of bone: Secondary | ICD-10-CM | POA: Insufficient documentation

## 2019-08-16 DIAGNOSIS — Z9049 Acquired absence of other specified parts of digestive tract: Secondary | ICD-10-CM | POA: Insufficient documentation

## 2019-08-16 DIAGNOSIS — Z888 Allergy status to other drugs, medicaments and biological substances status: Secondary | ICD-10-CM | POA: Diagnosis not present

## 2019-08-16 DIAGNOSIS — Z809 Family history of malignant neoplasm, unspecified: Secondary | ICD-10-CM | POA: Insufficient documentation

## 2019-08-16 LAB — COMPREHENSIVE METABOLIC PANEL
ALT: 52 U/L — ABNORMAL HIGH (ref 0–44)
AST: 33 U/L (ref 15–41)
Albumin: 3.2 g/dL — ABNORMAL LOW (ref 3.5–5.0)
Alkaline Phosphatase: 62 U/L (ref 38–126)
Anion gap: 9 (ref 5–15)
BUN: 11 mg/dL (ref 8–23)
CO2: 21 mmol/L — ABNORMAL LOW (ref 22–32)
Calcium: 9 mg/dL (ref 8.9–10.3)
Chloride: 108 mmol/L (ref 98–111)
Creatinine, Ser: 0.94 mg/dL (ref 0.61–1.24)
GFR calc Af Amer: >60 mL/min (ref >60)
GFR calc non Af Amer: >60 mL/min (ref >60)
Glucose, Bld: 161 mg/dL — ABNORMAL HIGH (ref 70–99)
Potassium: 3.6 mmol/L (ref 3.5–5.1)
Sodium: 138 mmol/L (ref 135–145)
Total Bilirubin: 0.7 mg/dL (ref 0.3–1.2)
Total Protein: 6.6 g/dL (ref 6.5–8.1)

## 2019-08-16 LAB — CBC WITH DIFFERENTIAL/PLATELET
Abs Immature Granulocytes: 0.03 10*3/uL (ref 0.00–0.07)
Basophils Absolute: 0.1 10*3/uL (ref 0.0–0.1)
Basophils Relative: 3 %
Eosinophils Absolute: 0.1 10*3/uL (ref 0.0–0.5)
Eosinophils Relative: 3 %
HCT: 30.3 % — ABNORMAL LOW (ref 39.0–52.0)
Hemoglobin: 9.2 g/dL — ABNORMAL LOW (ref 13.0–17.0)
Immature Granulocytes: 1 %
Lymphocytes Relative: 16 %
Lymphs Abs: 0.7 10*3/uL (ref 0.7–4.0)
MCH: 28.8 pg (ref 26.0–34.0)
MCHC: 30.4 g/dL (ref 30.0–36.0)
MCV: 95 fL (ref 80.0–100.0)
Monocytes Absolute: 0.7 10*3/uL (ref 0.1–1.0)
Monocytes Relative: 18 %
Neutro Abs: 2.4 10*3/uL (ref 1.7–7.7)
Neutrophils Relative %: 59 %
Platelets: 492 10*3/uL — ABNORMAL HIGH (ref 150–400)
RBC: 3.19 MIL/uL — ABNORMAL LOW (ref 4.22–5.81)
RDW: 18.5 % — ABNORMAL HIGH (ref 11.5–15.5)
WBC: 4 10*3/uL (ref 4.0–10.5)
nRBC: 0 % (ref 0.0–0.2)

## 2019-08-16 MED ORDER — SODIUM CHLORIDE 0.9% FLUSH
10.0000 mL | INTRAVENOUS | Status: DC | PRN
  Administered 2019-08-16: 10 mL via INTRAVENOUS
  Filled 2019-08-16: qty 10

## 2019-08-16 MED ORDER — PROCHLORPERAZINE MALEATE 10 MG PO TABS
10.0000 mg | ORAL_TABLET | Freq: Once | ORAL | Status: AC
  Administered 2019-08-16: 10 mg via ORAL
  Filled 2019-08-16: qty 1

## 2019-08-16 MED ORDER — PACLITAXEL PROTEIN-BOUND CHEMO INJECTION 100 MG
100.0000 mg/m2 | Freq: Once | INTRAVENOUS | Status: AC
  Administered 2019-08-16: 10:00:00 200 mg via INTRAVENOUS
  Filled 2019-08-16: qty 40

## 2019-08-16 MED ORDER — SODIUM CHLORIDE 0.9 % IV SOLN
1600.0000 mg | Freq: Once | INTRAVENOUS | Status: AC
  Administered 2019-08-16: 1600 mg via INTRAVENOUS
  Filled 2019-08-16: qty 26.3

## 2019-08-16 MED ORDER — SODIUM CHLORIDE 0.9 % IV SOLN
Freq: Once | INTRAVENOUS | Status: AC
  Administered 2019-08-16: 09:00:00 via INTRAVENOUS
  Filled 2019-08-16: qty 250

## 2019-08-16 MED ORDER — HEPARIN SOD (PORK) LOCK FLUSH 100 UNIT/ML IV SOLN
500.0000 [IU] | Freq: Once | INTRAVENOUS | Status: AC
  Administered 2019-08-16: 500 [IU] via INTRAVENOUS
  Filled 2019-08-16: qty 5

## 2019-08-16 NOTE — Progress Notes (Signed)
Hematology/Oncology  Follow up note [facility] [phone] [phone]   Patient Care Team: Paulene Floor as PCP - General (Physician Assistant) Clent Jacks, RN as Oncology Nurse Navigator  REFERRING PROVIDER: Trinna Post, PA-C  CHIEF COMPLAINTS/REASON FOR VISIT:  Follow up for treatment of pancreatic cancer  HISTORY OF PRESENTING ILLNESS:   Craig [name] is a  73 y.o.  male with PMH listed below, including CABG x5, PVD, hypertension, former tobacco abuse, former alcohol abuse, iron deficiency anemia, was seen in consultation at the request of  Terrilee Croak, Adriana M, PA-C  for evaluation of abnormal CT Patient recently presented to primary care provider complaining for abdominal pain radiating to his back for about 10 days.  Patient uses Tylenol as needed for pain. Work-up showed elevated amylase, lipase, mild anemia with hemoglobin of 11.3 Acute hepatitis panel negative. 05/16/2019 CT abdomen pelvis with contrast showed poorly marginated heterogeneous hypodense 2.9 cm pancreatic mass at the uncinate process, worrisome for primary pancreatic cancer.  No biliary or pancreatic duct dilation. Several ill defined hypodense liver masses scattered throughout the liver, largest 3.1 cm in the segment 6 right liver lobe. Nonspecific portacaval and aortocaval adenopathy. Extreme medial left lung base 4.4 cm pleural-based mass probably a pleural metastasis.  Additional tiny pulmonary nodules scattered at the right lung base are indeterminate. Mild prostate or megaly  Patient was referred to heme-onc for further evaluation. Patient reports that his abdominal pain was 10 out of 10 a few days ago, he uses Tylenol as needed. Today his pain is getting better, 2 out of 10.  Denies any nausea, vomiting, diarrhea, fever or chills. Married, lives with wife.  Appetite is good.  He weighs 195 pounds today at the clinic. His weight was 204 pounds  back in May 2020.  He had a history of 37.5-pack-year smoking history quitting 2018. No longer drinking alcohol.  # ultrasound-guided liver biopsy on 05/21/2019.  Pathology showed fragments of benign hepatic parenchyma showing minimal macro vascular steatosis, otherwise no significant histo pathologic change. Single core of renal tissue showing approximately 25% glomerulosclerosis  # #History of CAD, status post CABG x5.  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  # Omniseq test showed PD-L1 50% TPS, TMB high, negative for BRCA1/2 or NTRK fusion.  MSI could not be completed.   INTERVAL HISTORY [name] is a 73 y.o. male who has above history reviewed by me today presents for follow up for evaluation prior to first cycle of chemotherapy. Problems and complaints are listed below: Patient is status post 1 unit of PRBC transfusion last week. Today he reports feeling well.  Fatigue has significantly improved. Neuropathy chronic, stable.  Denies any fever, chills, nausea, vomiting, diarrhea, chest pain, shortness of breath, abdominal pain urinary symptoms lower extremity swelling.     Review of Systems  Constitutional: Positive for fatigue and unexpected weight change. Negative for appetite change, chills and fever.  HENT:   Negative for hearing loss and voice change.   Eyes: Negative for eye problems and icterus.  Respiratory: Negative for chest tightness, cough and shortness of breath.   Cardiovascular: Negative for chest pain and leg swelling.  Gastrointestinal: Negative for abdominal distention and abdominal pain.  Endocrine: Negative for hot flashes.  Genitourinary: Negative for difficulty urinating, dysuria and frequency.   Musculoskeletal: Negative for arthralgias.  Skin: Negative for itching and rash.  Neurological: Positive for numbness. Negative  for light-headedness.  Hematological: Negative for adenopathy. Does not bruise/bleed easily.  Psychiatric/Behavioral: Negative for confusion.    MEDICAL HISTORY:  Past Medical History:  Diagnosis Date   Allergic rhinitis    Cancer [facility])    new dx Pancreatic Cancer - Aug 2020   CHF (congestive heart failure) (HCC)    Chronic cholecystitis    Coronary artery disease    DDD (degenerative disc disease), cervical    Dehydration 06/21/2019   Dyspnea    Early cataract    Erectile dysfunction    GERD (gastroesophageal reflux disease)    Gouty arthritis    Headache    occasional migraines   Hypercalcemia    Hyperlipemia    Hypertension    Palpitations    Peripheral vascular disease (HCC)    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   LIMA to LAD SVG to [place] SVG SEQUENTIALLY to OM1 and OM2 SVG to ACUTE MARGINAL   Spinal stenosis of cervical region    Vitamin D deficiency     SURGICAL HISTORY: Past Surgical History:  Procedure Laterality Date   BACK SURGERY  2018    fusion with screws   CHOLECYSTECTOMY N/A 11/13/2018   Procedure: LAPAROSCOPIC CHOLECYSTECTOMY;  Surgeon: Vickie Epley, MD;  Location: [facility] ORS;  Service: General;  Laterality: N/A;   COLONOSCOPY     CORONARY ARTERY BYPASS GRAFT N/A 09/30/2017   Procedure: CORONARY ARTERY BYPASS GRAFTING times five using right and left Saphaneous vein harvested endoscopicly  and left internal mammary artery. (CABG),TEE;  Surgeon: Rexene Alberts, MD;  Location: [place];  Service: Open Heart Surgery;  Laterality: N/A;   LEFT HEART CATH AND CORONARY ANGIOGRAPHY Left 09/06/2017   Procedure: LEFT HEART CATH AND CORONARY ANGIOGRAPHY;  Surgeon: Corey Skains, MD;  Location: [place] CV LAB;  Service: Cardiovascular;  Laterality: Left;   percutaneous transluminal balloon angioplasty  01/2010   of left lower extremity   PORTA CATH INSERTION N/A 06/06/2019   Procedure: PORTA CATH INSERTION;  Surgeon: Katha Cabal, MD;  Location: [place] CV LAB;  Service: Cardiovascular;  Laterality: N/A;   TEE WITHOUT CARDIOVERSION N/A 09/30/2017   Procedure: TRANSESOPHAGEAL ECHOCARDIOGRAM (TEE);  Surgeon: Rexene Alberts, MD;  Location: [place];  Service: Open Heart Surgery;  Laterality: N/A;   VASCULAR SURGERY Left 2010   left exernal iliac and superficial femoral artery PTCA and stenting    SOCIAL HISTORY: Social History   Socioeconomic History   Marital status: Married    Spouse name: Enid Derry   Number of children: 2   Years of education: Not on file   Highest education level: Not on file  Occupational History   Occupation: drove tractors    Comment: retired  Scientist, product/process development strain: Not hard at [employer] insecurity    Worry: Never true    Inability: Never true   Transportation needs    Medical: No    Non-medical: No  Tobacco Use   Smoking status: Former Smoker    Packs/day: 0.75    Years: 50.00    Pack years: 37.50    Types: Cigarettes    Quit date: 09/27/2017    Years since quitting: 1.8   Smokeless tobacco: Former Systems developer    Types: Chew  Substance and Sexual Activity   Alcohol use: Not Currently    Comment: stopped drinking before cabg   Drug use: Not Currently    Types:  Marijuana    Comment: stopped smoking before CABG   Sexual activity: Not Currently  Lifestyle   Physical activity    Days per week: 5 days    Minutes per session: 150+ min   Stress: Not at all  Relationships   Social connections    Talks on phone: More than three times a week    Gets together: More than three times a week    Attends religious service: Never    Active member of club or organization: No    Attends meetings of clubs or organizations: Never    Relationship status: Married   Intimate partner violence    Fear of current or ex partner: Not on file    Emotionally abused: Not on file    Physically abused: Not on file    Forced sexual activity: Not on  file  Other Topics Concern   Not on file  Social History Narrative   Not on file    FAMILY HISTORY: Family History  Problem Relation Age of Onset   Brain cancer Mother    Emphysema Father    Cancer Brother     ALLERGIES:  is allergic to lipitor [atorvastatin]; zetia [ezetimibe]; lisinopril; protonix [pantoprazole]; and spironolactone.  MEDICATIONS:  Current Outpatient Medications  Medication Sig Dispense Refill   aspirin EC 81 MG tablet Take 81 mg by mouth daily.     carvedilol (COREG) 6.25 MG tablet Take 6.25 mg by mouth 2 (two) times daily with a meal.  3   diltiazem (CARDIZEM CD) 300 MG 24 hr capsule TAKE 1 CAPSULE BY MOUTH EVERY DAY 90 capsule 1   dorzolamide-timolol (COSOPT) 22.3-6.8 MG/ML ophthalmic solution INSTILL ONE DROP INTO BOTH EYES TWICE DAILY     ferrous sulfate 325 (65 FE) MG tablet TAKE 1 TABLET BY MOUTH EVERY DAY WITH BREAKFAST 90 tablet 1   gabapentin (NEURONTIN) 300 MG capsule TAKE 2 CAPSULES (600 MG TOTAL) BY MOUTH 3 (THREE) TIMES DAILY. 540 capsule 0   latanoprost (XALATAN) 0.005 % ophthalmic solution Place 1 drop into both eyes at bedtime.   3   Multiple Vitamin (MULTIVITAMIN WITH MINERALS) TABS tablet Take 1 tablet by mouth daily.     omeprazole (PRILOSEC) 40 MG capsule TAKE 1 CAPSULE BY MOUTH EVERY DAY 90 capsule 0   OMEGA-3 FATTY ACIDS PO Take 1,200 mg by mouth daily.      No current facility-administered medications for this visit.    Facility-Administered Medications Ordered in Other Visits  Medication Dose Route Frequency Provider Last Rate Last Dose   sodium chloride flush (NS) 0.9 % injection 10 mL  10 mL Intracatheter PRN Earlie Server, MD       sodium chloride flush (NS) 0.9 % injection 10 mL  10 mL Intravenous PRN Earlie Server, MD   10 mL at 08/16/19 0829     PHYSICAL EXAMINATION: ECOG PERFORMANCE STATUS: 1 - Symptomatic but completely ambulatory Vitals:   08/16/19 0835  BP: 139/70  Pulse: 99  Resp: 18  Temp: 98.9 F (37.2 C)     Filed Weights   08/16/19 0835  Weight: 182 lb 4.8 oz (82.7 kg)    Physical Exam Constitutional:      General: He is not in acute distress.    Comments: Walk in   HENT:     Head: Normocephalic and atraumatic.  Eyes:     General: No scleral icterus.    Pupils: Pupils are equal, round, and reactive to light.  Neck:  Musculoskeletal: Normal range of motion and neck supple.  Cardiovascular:     Rate and Rhythm: Normal rate and regular rhythm.     Heart sounds: Normal heart sounds.  Pulmonary:     Effort: Pulmonary effort is normal. No respiratory distress.     Breath sounds: No wheezing.  Abdominal:     General: Bowel sounds are normal. There is no distension.     Palpations: Abdomen is soft. There is no mass.     Tenderness: There is no abdominal tenderness.  Musculoskeletal: Normal range of motion.        General: No deformity.  Skin:    General: Skin is warm and dry.     Findings: No erythema or rash.  Neurological:     General: No focal deficit present.     Mental Status: He is alert and oriented to person, place, and time.     Cranial Nerves: No cranial nerve deficit.     Coordination: Coordination normal.  Psychiatric:        Behavior: Behavior normal.        Thought Content: Thought content normal.     LABORATORY DATA:  I have reviewed the data as listed Lab Results  Component Value Date   WBC 4.0 08/16/2019   HGB 9.2 (L) 08/16/2019   HCT 30.3 (L) 08/16/2019   MCV 95.0 08/16/2019   PLT 492 (H) 08/16/2019   Recent Labs    07/26/19 0831 08/02/19 0825 08/16/19 0820  NA 136 140 138  K 4.0 3.6 3.6  CL 106 111 108  CO2 23 21* 21*  GLUCOSE 211* 159* 161*  BUN '17 10 11  '$ CREATININE 0.98 0.92 0.94  CALCIUM 9.0 9.2 9.0  GFRNONAA >60 >60 >60  GFRAA >60 >60 >60  PROT 6.5 6.9 6.6  ALBUMIN 3.3* 3.1* 3.2*  AST 28 56* 33  ALT 71* 95* 52*  ALKPHOS 64 54 62  BILITOT 0.8 0.6 0.7   Iron/TIBC/Ferritin/ %Sat    Component Value Date/Time   IRON 119  04/27/2019 0940   TIBC 363 04/27/2019 0940   FERRITIN 58 04/27/2019 0940   IRONPCTSAT 33 04/27/2019 0940      RADIOGRAPHIC STUDIES: I have personally reviewed the radiological images as listed and agreed with the findings in the report. Nm Pet Image Initial (pi) Skull Base To Thigh  Result Date: 06/04/2019 CLINICAL DATA:  Initial treatment strategy for stage IV pancreatic cancer with biopsy confirmed liver metastasis. EXAM: NUCLEAR MEDICINE PET SKULL BASE TO THIGH TECHNIQUE: 11.0 mCi F-18 FDG was injected intravenously. Full-ring PET imaging was performed from the skull base to thigh after the radiotracer. CT data was obtained and used for attenuation correction and anatomic localization. Fasting blood glucose: 123 mg/dl COMPARISON:  05/16/2019 CT abdomen/pelvis. 05/10/2019 screening chest CT. FINDINGS: Mediastinal blood pool activity: SUV max 3.2 Liver activity: SUV max NA NECK: No hypermetabolic lymph nodes in the neck. Incidental CT findings: none CHEST: Bilateral hypermetabolic hilar foci with max SUV 5.2 on the left and 3.5 on the right. No enlarged or hypermetabolic axillary or mediastinal lymph nodes. No hypermetabolic pulmonary findings. A few scattered solid right pulmonary nodules, largest 4 mm in the basilar right lower lobe (series 3/image 125), below PET resolution. The pleural based solid density 4.3 x 2.3 cm structure at the extreme medial left lung base (series 3/image 129) is non hypermetabolic. Incidental CT findings: Three-vessel coronary atherosclerosis status post CABG. Atherosclerotic nonaneurysmal thoracic aorta. Dilated main pulmonary artery (3.6 cm diameter). Intact  sternotomy wires. ABDOMEN/PELVIS: Intensely hypermetabolic uncinate process pancreatic mass measuring approximately 4.2 x 2.6 cm with max SUV 14.4 (series 3/image 163). Enlarged hypermetabolic 1.4 cm portacaval node with max SUV 10.5 (series 3/image 155). Several small hypermetabolic aortocaval and left para-aortic  lymph nodes, largest 1.0 cm in the aortocaval chain with max SUV 6.2 (series 3/image 171). Several (at least 5) hypermetabolic liver masses scattered throughout the liver, largest 3.5 cm in the segment 6 right liver lobe with max SUV 12.6 (series 3/image 152), 2.4 cm in the central segment 5 right liver lobe with max SUV 13.6 (series 3/image 147) and 2.3 cm in posterior right liver lobe adjacent to the IVC with max SUV 10.9 (series 3/image 147). No abnormal hypermetabolic activity within the adrenal glands or spleen. No hypermetabolic lymph nodes in the pelvis. Incidental CT findings: Cholecystectomy. Simple 4.6 cm lower right renal cyst. Simple 1.5 cm interpolar left renal cyst. Atherosclerotic nonaneurysmal abdominal aorta. SKELETON: Faint hypermetabolism in the medial right iliac bone with associated faint sclerosis with max SUV 3.3 (series 3/image 215). No additional hypermetabolic osseous foci. Incidental CT findings: none IMPRESSION: 1. Intensely hypermetabolic uncinate process pancreatic mass, compatible with primary pancreatic adenocarcinoma. 2. Hypermetabolic portacaval, aortocaval and left para-aortic nodal metastases. Bilateral hilar nodal hypermetabolism is also compatible with nodal metastatic disease. 3. Several (at least 5) hypermetabolic liver metastases scattered throughout the liver. 4. Equivocal faint hypermetabolism in the medial right iliac bone with associated faint sclerosis, osseous metastasis not excluded, although potentially degenerative. 5. Tiny scattered right pulmonary nodules, below PET resolution, recommend attention on follow-up chest CT in 3-6 months. 6. Chronic findings include: Aortic Atherosclerosis (ICD10-I70.0). Dilated main pulmonary artery, suggesting pulmonary arterial hypertension. Electronically Signed   By: Ilona Sorrel M.D.   On: 06/04/2019 13:56   US Biopsy (liver)  Result Date: 05/21/2019 INDICATION: Multiple liver lesions. EXAM: ULTRASOUND DIRECTED LIVER BIOPSY.  MEDICATIONS: None. ANESTHESIA/SEDATION: Moderate (conscious) sedation was employed during this procedure. A total of Versed 4.5 mg and Fentanyl 100 mcg was administered intravenously. Moderate Sedation Time: 37 minutes. The patient's level of consciousness and vital signs were monitored continuously by radiology nursing throughout the procedure under my direct supervision. FLUOROSCOPY TIME:  Fluoroscopy Time: 0 COMPLICATIONS: None immediate. PROCEDURE: After discussing the risks and benefits of this procedure with the patient informed consent was obtained. Abdomen sterilely prepped and draped. Local anesthesia administered 1% lidocaine. IV conscious sedation performed as above. Ultrasound liver biopsy was performed with a 18 gauge needle. Two good samples obtained. No complications. Post procedure patient stable. Discharge instructions given. Follow-up with patient's physician. IMPRESSION: Successful ultrasound directed liver mass biopsy. Electronically Signed   By: Marcello Moores  Register   On: 05/21/2019 09:55   Ct Biopsy  Result Date: 05/24/2019 INDICATION: Probable liver metastasis. EXAM: CT DIRECTED LIVER MASS BIOPSY MEDICATIONS: None. ANESTHESIA/SEDATION: Moderate (conscious) sedation was employed during this procedure. A total of Versed 3.0 mg and Fentanyl 50.0 mcg was administered intravenously. Moderate Sedation Time: 15 minutes. The patient's level of consciousness and vital signs were monitored continuously by radiology nursing throughout the procedure under my direct supervision. FLUOROSCOPY TIME:  Fluoroscopy Time: 9 COMPLICATIONS: None immediate. PROCEDURE: After discussing the risks and benefits of this procedure with the patient informed consent was obtained local anesthesia administered 1% lidocaine. Under CT guidance CT-directed liver mass biopsy was performed without difficulty. Two 13 mm 18 gauge core samples obtained and sent to pathology. Post biopsy CT revealed no complications. Patient was  transferred to specials recovery. Patient was evaluated  in specials recovery and noted to be stable. Discharge instructions were given to him and his wife. Follow-up is with oncology. IMPRESSION: Successful CT directed liver mass biopsy. Electronically Signed   By: Marcello Moores  Register   On: 05/24/2019 12:22      ASSESSMENT & PLAN:  1. Pancreatic cancer metastasized to liver ([place])   2. Anemia due to antineoplastic chemotherapy   3. Fatigue, unspecified type   4. Liver metastasis ([place])   5. Encounter for antineoplastic chemotherapy   6. Neuropathy    #Metastatic pancreatic cancer Labs are reviewed and discussed with patient. Counts acceptable to proceed with cycle 4-day 1 gemcitabine and Abraxane. CA 19-9 is trending down.  Indicating good response. Plan repeat images after cycle 4 treatments.  #Pre-existing neuropathy, continue gabapentin 600 mg 3 times daily.  Close monitoring.  #Anemia, pre-existing and secondary to chemotherapy, improved after 1 unit of PRBC transfusion.  #Fatigue, multifactorial, chemotherapy, anemia, malignancy.  Improved.  I discussed with patient about genetic testing.  He is not interested.  Encourage patient to bring his wife to clinic visit and will discuss the issue issue.  All questions were answered. The patient knows to call the clinic with any problems questions or concerns.  cc Trinna Post, PA-C   Return of visit: 1 WEEK  follow-up for next cycle of treatments.  Earlie Server, MD, PhD Hematology Oncology [place] at [facility] 08/16/2019

## 2019-08-17 LAB — CANCER ANTIGEN 19-9: CA 19-9: 118 U/mL — ABNORMAL HIGH (ref 0–35)

## 2019-08-22 ENCOUNTER — Other Ambulatory Visit: Payer: Self-pay

## 2019-08-22 ENCOUNTER — Encounter: Payer: Self-pay | Admitting: Oncology

## 2019-08-22 NOTE — Progress Notes (Signed)
Patient stated that he had been doing well today but the days after chemo he does not feel good. Patient stated that he gets tired, weak, and sleeps all the time. Patient stated that his appetite is not the best because he just doesn't get hungry. Patient would like to know if his chemo dosage could be reduced and see if this will help him with his energy and appetite.

## 2019-08-23 ENCOUNTER — Inpatient Hospital Stay (HOSPITAL_BASED_OUTPATIENT_CLINIC_OR_DEPARTMENT_OTHER): Payer: Medicare Other | Admitting: Hospice and Palliative Medicine

## 2019-08-23 ENCOUNTER — Other Ambulatory Visit: Payer: Self-pay

## 2019-08-23 ENCOUNTER — Inpatient Hospital Stay: Payer: Medicare Other

## 2019-08-23 ENCOUNTER — Inpatient Hospital Stay (HOSPITAL_BASED_OUTPATIENT_CLINIC_OR_DEPARTMENT_OTHER): Payer: Medicare Other | Admitting: Oncology

## 2019-08-23 VITALS — BP 119/65 | HR 87 | Temp 97.3°F | Resp 18 | Wt 178.9 lb

## 2019-08-23 DIAGNOSIS — C787 Secondary malignant neoplasm of liver and intrahepatic bile duct: Secondary | ICD-10-CM | POA: Diagnosis not present

## 2019-08-23 DIAGNOSIS — Z515 Encounter for palliative care: Secondary | ICD-10-CM

## 2019-08-23 DIAGNOSIS — R5383 Other fatigue: Secondary | ICD-10-CM

## 2019-08-23 DIAGNOSIS — C259 Malignant neoplasm of pancreas, unspecified: Secondary | ICD-10-CM

## 2019-08-23 DIAGNOSIS — G629 Polyneuropathy, unspecified: Secondary | ICD-10-CM | POA: Diagnosis not present

## 2019-08-23 DIAGNOSIS — T451X5A Adverse effect of antineoplastic and immunosuppressive drugs, initial encounter: Secondary | ICD-10-CM

## 2019-08-23 DIAGNOSIS — Z7189 Other specified counseling: Secondary | ICD-10-CM

## 2019-08-23 DIAGNOSIS — D6481 Anemia due to antineoplastic chemotherapy: Secondary | ICD-10-CM | POA: Diagnosis not present

## 2019-08-23 DIAGNOSIS — Z5111 Encounter for antineoplastic chemotherapy: Secondary | ICD-10-CM | POA: Diagnosis not present

## 2019-08-23 LAB — COMPREHENSIVE METABOLIC PANEL
ALT: 44 U/L (ref 0–44)
AST: 33 U/L (ref 15–41)
Albumin: 3.1 g/dL — ABNORMAL LOW (ref 3.5–5.0)
Alkaline Phosphatase: 51 U/L (ref 38–126)
Anion gap: 6 (ref 5–15)
BUN: 12 mg/dL (ref 8–23)
CO2: 20 mmol/L — ABNORMAL LOW (ref 22–32)
Calcium: 9 mg/dL (ref 8.9–10.3)
Chloride: 106 mmol/L (ref 98–111)
Creatinine, Ser: 1.07 mg/dL (ref 0.61–1.24)
GFR calc Af Amer: >60 mL/min (ref >60)
GFR calc non Af Amer: >60 mL/min (ref >60)
Glucose, Bld: 154 mg/dL — ABNORMAL HIGH (ref 70–99)
Potassium: 3.7 mmol/L (ref 3.5–5.1)
Sodium: 132 mmol/L — ABNORMAL LOW (ref 135–145)
Total Bilirubin: 0.7 mg/dL (ref 0.3–1.2)
Total Protein: 6.9 g/dL (ref 6.5–8.1)

## 2019-08-23 LAB — CBC WITH DIFFERENTIAL/PLATELET
Abs Immature Granulocytes: 0.14 10*3/uL — ABNORMAL HIGH (ref 0.00–0.07)
Basophils Absolute: 0.1 10*3/uL (ref 0.0–0.1)
Basophils Relative: 1 %
Eosinophils Absolute: 0.2 10*3/uL (ref 0.0–0.5)
Eosinophils Relative: 2 %
HCT: 28.1 % — ABNORMAL LOW (ref 39.0–52.0)
Hemoglobin: 8.6 g/dL — ABNORMAL LOW (ref 13.0–17.0)
Immature Granulocytes: 2 %
Lymphocytes Relative: 10 %
Lymphs Abs: 0.6 10*3/uL — ABNORMAL LOW (ref 0.7–4.0)
MCH: 28.7 pg (ref 26.0–34.0)
MCHC: 30.6 g/dL (ref 30.0–36.0)
MCV: 93.7 fL (ref 80.0–100.0)
Monocytes Absolute: 0.5 10*3/uL (ref 0.1–1.0)
Monocytes Relative: 7 %
Neutro Abs: 4.8 10*3/uL (ref 1.7–7.7)
Neutrophils Relative %: 78 %
Platelets: 295 10*3/uL (ref 150–400)
RBC: 3 MIL/uL — ABNORMAL LOW (ref 4.22–5.81)
RDW: 17.3 % — ABNORMAL HIGH (ref 11.5–15.5)
WBC: 6.2 10*3/uL (ref 4.0–10.5)
nRBC: 0.8 % — ABNORMAL HIGH (ref 0.0–0.2)

## 2019-08-23 MED ORDER — HEPARIN SOD (PORK) LOCK FLUSH 100 UNIT/ML IV SOLN
500.0000 [IU] | Freq: Once | INTRAVENOUS | Status: AC
  Administered 2019-08-23: 500 [IU] via INTRAVENOUS
  Filled 2019-08-23: qty 5

## 2019-08-23 NOTE — Progress Notes (Signed)
Hematology/Oncology  Follow up note [facility] [phone] [phone]   Patient Care Team: Paulene Floor as PCP - General (Physician Assistant) Clent Jacks, RN as Oncology Nurse Navigator  REFERRING PROVIDER: Trinna Post, PA-C  CHIEF COMPLAINTS/REASON FOR VISIT:  Follow up for treatment of pancreatic cancer  HISTORY OF PRESENTING ILLNESS:   Craig [name] is a  73 y.o.  male with PMH listed below, including CABG x5, PVD, hypertension, former tobacco abuse, former alcohol abuse, iron deficiency anemia, was seen in consultation at the request of  Terrilee Croak, Adriana M, PA-C  for evaluation of abnormal CT Patient recently presented to primary care provider complaining for abdominal pain radiating to his back for about 10 days.  Patient uses Tylenol as needed for pain. Work-up showed elevated amylase, lipase, mild anemia with hemoglobin of 11.3 Acute hepatitis panel negative. 05/16/2019 CT abdomen pelvis with contrast showed poorly marginated heterogeneous hypodense 2.9 cm pancreatic mass at the uncinate process, worrisome for primary pancreatic cancer.  No biliary or pancreatic duct dilation. Several ill defined hypodense liver masses scattered throughout the liver, largest 3.1 cm in the segment 6 right liver lobe. Nonspecific portacaval and aortocaval adenopathy. Extreme medial left lung base 4.4 cm pleural-based mass probably a pleural metastasis.  Additional tiny pulmonary nodules scattered at the right lung base are indeterminate. Mild prostate or megaly  Patient was referred to heme-onc for further evaluation. Patient reports that his abdominal pain was 10 out of 10 a few days ago, he uses Tylenol as needed. Today his pain is getting better, 2 out of 10.  Denies any nausea, vomiting, diarrhea, fever or chills. Married, lives with wife.  Appetite is good.  He weighs 195 pounds today at the clinic. His weight was 204 pounds  back in May 2020.  He had a history of 37.5-pack-year smoking history quitting 2018. No longer drinking alcohol.  # ultrasound-guided liver biopsy on 05/21/2019.  Pathology showed fragments of benign hepatic parenchyma showing minimal macro vascular steatosis, otherwise no significant histo pathologic change. Single core of renal tissue showing approximately 25% glomerulosclerosis  # #History of CAD, status post CABG x5.  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  # Omniseq test showed PD-L1 50% TPS, TMB high, negative for BRCA1/2 or NTRK fusion.  MSI could not be completed.   INTERVAL HISTORY Craig [name] is a 72 y.o. male who has above history reviewed by me today presents for follow up for evaluation prior to chemotherapy. Problems and complaints are listed below: Patient reports feeling profoundly weak and fatigue for 3-4 days after last week's chemotherapy.  He starts to feel better since yesterday, but does not feel back to his baseline yet.  Denies any fever,chills, cough, nausea or vomiting.  He continues to have numbness and tingling of his toes and finger tips. He has sensation of walking on soft surface.    Review of Systems  Constitutional: Positive for fatigue and unexpected weight change. Negative for appetite change, chills and fever.  HENT:   Negative for hearing loss and voice change.   Eyes: Negative for eye problems and icterus.  Respiratory: Negative for chest tightness, cough and shortness of breath.   Cardiovascular: Negative for chest pain and leg swelling.  Gastrointestinal: Negative for abdominal distention and abdominal pain.  Endocrine: Negative for hot flashes.  Genitourinary: Negative for difficulty urinating, dysuria and frequency.  Musculoskeletal: Negative for arthralgias.  Skin: Negative for itching and rash.  Neurological:  Positive for numbness. Negative for light-headedness.  Hematological: Negative for adenopathy. Does not bruise/bleed easily.  Psychiatric/Behavioral: Negative for confusion.    MEDICAL HISTORY:  Past Medical History:  Diagnosis Date   Allergic rhinitis    Cancer [facility])    new dx Pancreatic Cancer - Aug 2020   CHF (congestive heart failure) (HCC)    Chronic cholecystitis    Coronary artery disease    DDD (degenerative disc disease), cervical    Dehydration 06/21/2019   Dyspnea    Early cataract    Erectile dysfunction    GERD (gastroesophageal reflux disease)    Gouty arthritis    Headache    occasional migraines   Hypercalcemia    Hyperlipemia    Hypertension    Palpitations    Peripheral vascular disease (HCC)    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   LIMA to LAD SVG to [place] SVG SEQUENTIALLY to OM1 and OM2 SVG to ACUTE MARGINAL   Spinal stenosis of cervical region    Vitamin D deficiency     SURGICAL HISTORY: Past Surgical History:  Procedure Laterality Date   BACK SURGERY  2018    fusion with screws   CHOLECYSTECTOMY N/A 11/13/2018   Procedure: LAPAROSCOPIC CHOLECYSTECTOMY;  Surgeon: Vickie Epley, MD;  Location: [facility] ORS;  Service: General;  Laterality: N/A;   COLONOSCOPY     CORONARY ARTERY BYPASS GRAFT N/A 09/30/2017   Procedure: CORONARY ARTERY BYPASS GRAFTING times five using right and left Saphaneous vein harvested endoscopicly  and left internal mammary artery. (CABG),TEE;  Surgeon: Rexene Alberts, MD;  Location: [place];  Service: Open Heart Surgery;  Laterality: N/A;   LEFT HEART CATH AND CORONARY ANGIOGRAPHY Left 09/06/2017   Procedure: LEFT HEART CATH AND CORONARY ANGIOGRAPHY;  Surgeon: Corey Skains, MD;  Location: [place] CV LAB;  Service: Cardiovascular;  Laterality: Left;   percutaneous transluminal balloon angioplasty  01/2010   of left lower extremity   PORTA CATH INSERTION N/A 06/06/2019   Procedure: PORTA CATH  INSERTION;  Surgeon: Katha Cabal, MD;  Location: [place] CV LAB;  Service: Cardiovascular;  Laterality: N/A;   TEE WITHOUT CARDIOVERSION N/A 09/30/2017   Procedure: TRANSESOPHAGEAL ECHOCARDIOGRAM (TEE);  Surgeon: Rexene Alberts, MD;  Location: [place];  Service: Open Heart Surgery;  Laterality: N/A;   VASCULAR SURGERY Left 2010   left exernal iliac and superficial femoral artery PTCA and stenting    SOCIAL HISTORY: Social History   Socioeconomic History   Marital status: Married    Spouse name: Enid Derry   Number of children: 2   Years of education: Not on file   Highest education level: Not on file  Occupational History   Occupation: drove tractors    Comment: retired  Scientist, product/process development strain: Not hard at [employer] insecurity    Worry: Never true    Inability: Never true   Transportation needs    Medical: No    Non-medical: No  Tobacco Use   Smoking status: Former Smoker    Packs/day: 0.75    Years: 50.00    Pack years: 37.50    Types: Cigarettes    Quit date: 09/27/2017    Years since quitting: 1.9   Smokeless tobacco: Former Systems developer    Types: Chew  Substance and Sexual Activity   Alcohol use: Not Currently    Comment: stopped drinking  before cabg   Drug use: Not Currently    Types: Marijuana    Comment: stopped smoking before CABG   Sexual activity: Not Currently  Lifestyle   Physical activity    Days per week: 5 days    Minutes per session: 150+ min   Stress: Not at all  Relationships   Social connections    Talks on phone: More than three times a week    Gets together: More than three times a week    Attends religious service: Never    Active member of club or organization: No    Attends meetings of clubs or organizations: Never    Relationship status: Married   Intimate partner violence    Fear of current or ex partner: Not on file    Emotionally abused: Not on file    Physically abused: Not on file     Forced sexual activity: Not on file  Other Topics Concern   Not on file  Social History Narrative   Not on file    FAMILY HISTORY: Family History  Problem Relation Age of Onset   Brain cancer Mother    Emphysema Father    Cancer Brother     ALLERGIES:  is allergic to lipitor [atorvastatin]; zetia [ezetimibe]; lisinopril; protonix [pantoprazole]; and spironolactone.  MEDICATIONS:  Current Outpatient Medications  Medication Sig Dispense Refill   aspirin EC 81 MG tablet Take 81 mg by mouth daily.     carvedilol (COREG) 6.25 MG tablet Take 6.25 mg by mouth 2 (two) times daily with a meal.  3   diltiazem (CARDIZEM CD) 300 MG 24 hr capsule TAKE 1 CAPSULE BY MOUTH EVERY DAY 90 capsule 1   dorzolamide-timolol (COSOPT) 22.3-6.8 MG/ML ophthalmic solution INSTILL ONE DROP INTO BOTH EYES TWICE DAILY     ferrous sulfate 325 (65 FE) MG tablet TAKE 1 TABLET BY MOUTH EVERY DAY WITH BREAKFAST 90 tablet 1   gabapentin (NEURONTIN) 300 MG capsule TAKE 2 CAPSULES (600 MG TOTAL) BY MOUTH 3 (THREE) TIMES DAILY. 540 capsule 0   latanoprost (XALATAN) 0.005 % ophthalmic solution Place 1 drop into both eyes at bedtime.   3   Multiple Vitamin (MULTIVITAMIN WITH MINERALS) TABS tablet Take 1 tablet by mouth daily.     OMEGA-3 FATTY ACIDS PO Take 1,200 mg by mouth daily.      omeprazole (PRILOSEC) 40 MG capsule TAKE 1 CAPSULE BY MOUTH EVERY DAY (Patient not taking: Reported on 08/22/2019) 90 capsule 0   No current facility-administered medications for this visit.    Facility-Administered Medications Ordered in Other Visits  Medication Dose Route Frequency Provider Last Rate Last Dose   sodium chloride flush (NS) 0.9 % injection 10 mL  10 mL Intracatheter PRN Earlie Server, MD         PHYSICAL EXAMINATION: ECOG PERFORMANCE STATUS: 1 - Symptomatic but completely ambulatory Vitals:   08/22/19 1002  BP: 119/65  Pulse: 87  Resp: 18  Temp: (!) 97.3 F (36.3 C)   Filed Weights   08/22/19 1002    Weight: 178 lb 14.4 oz (81.1 kg)    Physical Exam Constitutional:      General: He is not in acute distress.    Comments: Walk in   HENT:     Head: Normocephalic and atraumatic.  Eyes:     General: No scleral icterus.    Pupils: Pupils are equal, round, and reactive to light.  Neck:     Musculoskeletal: Normal range of motion and  neck supple.  Cardiovascular:     Rate and Rhythm: Normal rate and regular rhythm.     Heart sounds: Normal heart sounds.  Pulmonary:     Effort: Pulmonary effort is normal. No respiratory distress.     Breath sounds: No wheezing.  Abdominal:     General: Bowel sounds are normal. There is no distension.     Palpations: Abdomen is soft. There is no mass.     Tenderness: There is no abdominal tenderness.  Musculoskeletal: Normal range of motion.        General: No deformity.  Skin:    General: Skin is warm and dry.     Findings: No erythema or rash.  Neurological:     General: No focal deficit present.     Mental Status: He is alert and oriented to person, place, and time.     Cranial Nerves: No cranial nerve deficit.     Coordination: Coordination normal.  Psychiatric:        Behavior: Behavior normal.        Thought Content: Thought content normal.     LABORATORY DATA:  I have reviewed the data as listed Lab Results  Component Value Date   WBC 6.2 08/23/2019   HGB 8.6 (L) 08/23/2019   HCT 28.1 (L) 08/23/2019   MCV 93.7 08/23/2019   PLT 295 08/23/2019   Recent Labs    08/02/19 0825 08/16/19 0820 08/23/19 0807  NA 140 138 132*  K 3.6 3.6 3.7  CL 111 108 106  CO2 21* 21* 20*  GLUCOSE 159* 161* 154*  BUN '10 11 12  '$ CREATININE 0.92 0.94 1.07  CALCIUM 9.2 9.0 9.0  GFRNONAA >60 >60 >60  GFRAA >60 >60 >60  PROT 6.9 6.6 6.9  ALBUMIN 3.1* 3.2* 3.1*  AST 56* 33 33  ALT 95* 52* 44  ALKPHOS 54 62 51  BILITOT 0.6 0.7 0.7   Iron/TIBC/Ferritin/ %Sat    Component Value Date/Time   IRON 119 04/27/2019 0940   TIBC 363 04/27/2019 0940    FERRITIN 58 04/27/2019 0940   IRONPCTSAT 33 04/27/2019 0940      RADIOGRAPHIC STUDIES: I have personally reviewed the radiological images as listed and agreed with the findings in the report. Nm Pet Image Initial (pi) Skull Base To Thigh  Result Date: 06/04/2019 CLINICAL DATA:  Initial treatment strategy for stage IV pancreatic cancer with biopsy confirmed liver metastasis. EXAM: NUCLEAR MEDICINE PET SKULL BASE TO THIGH TECHNIQUE: 11.0 mCi F-18 FDG was injected intravenously. Full-ring PET imaging was performed from the skull base to thigh after the radiotracer. CT data was obtained and used for attenuation correction and anatomic localization. Fasting blood glucose: 123 mg/dl COMPARISON:  05/16/2019 CT abdomen/pelvis. 05/10/2019 screening chest CT. FINDINGS: Mediastinal blood pool activity: SUV max 3.2 Liver activity: SUV max NA NECK: No hypermetabolic lymph nodes in the neck. Incidental CT findings: none CHEST: Bilateral hypermetabolic hilar foci with max SUV 5.2 on the left and 3.5 on the right. No enlarged or hypermetabolic axillary or mediastinal lymph nodes. No hypermetabolic pulmonary findings. A few scattered solid right pulmonary nodules, largest 4 mm in the basilar right lower lobe (series 3/image 125), below PET resolution. The pleural based solid density 4.3 x 2.3 cm structure at the extreme medial left lung base (series 3/image 129) is non hypermetabolic. Incidental CT findings: Three-vessel coronary atherosclerosis status post CABG. Atherosclerotic nonaneurysmal thoracic aorta. Dilated main pulmonary artery (3.6 cm diameter). Intact sternotomy wires. ABDOMEN/PELVIS: Intensely hypermetabolic uncinate process  pancreatic mass measuring approximately 4.2 x 2.6 cm with max SUV 14.4 (series 3/image 163). Enlarged hypermetabolic 1.4 cm portacaval node with max SUV 10.5 (series 3/image 155). Several small hypermetabolic aortocaval and left para-aortic lymph nodes, largest 1.0 cm in the aortocaval  chain with max SUV 6.2 (series 3/image 171). Several (at least 5) hypermetabolic liver masses scattered throughout the liver, largest 3.5 cm in the segment 6 right liver lobe with max SUV 12.6 (series 3/image 152), 2.4 cm in the central segment 5 right liver lobe with max SUV 13.6 (series 3/image 147) and 2.3 cm in posterior right liver lobe adjacent to the IVC with max SUV 10.9 (series 3/image 147). No abnormal hypermetabolic activity within the adrenal glands or spleen. No hypermetabolic lymph nodes in the pelvis. Incidental CT findings: Cholecystectomy. Simple 4.6 cm lower right renal cyst. Simple 1.5 cm interpolar left renal cyst. Atherosclerotic nonaneurysmal abdominal aorta. SKELETON: Faint hypermetabolism in the medial right iliac bone with associated faint sclerosis with max SUV 3.3 (series 3/image 215). No additional hypermetabolic osseous foci. Incidental CT findings: none IMPRESSION: 1. Intensely hypermetabolic uncinate process pancreatic mass, compatible with primary pancreatic adenocarcinoma. 2. Hypermetabolic portacaval, aortocaval and left para-aortic nodal metastases. Bilateral hilar nodal hypermetabolism is also compatible with nodal metastatic disease. 3. Several (at least 5) hypermetabolic liver metastases scattered throughout the liver. 4. Equivocal faint hypermetabolism in the medial right iliac bone with associated faint sclerosis, osseous metastasis not excluded, although potentially degenerative. 5. Tiny scattered right pulmonary nodules, below PET resolution, recommend attention on follow-up chest CT in 3-6 months. 6. Chronic findings include: Aortic Atherosclerosis (ICD10-I70.0). Dilated main pulmonary artery, suggesting pulmonary arterial hypertension. Electronically Signed   By: Ilona Sorrel M.D.   On: 06/04/2019 13:56      ASSESSMENT & PLAN:  1. Pancreatic cancer metastasized to liver ([place])   2. Anemia due to antineoplastic chemotherapy   3. Fatigue, unspecified type   4.  Neuropathy   5. Liver metastasis ([place])    #Metastatic pancreatic cancer- liver mets, hilar nodal metastasis, ?bone mets, Labs are reviewed and discussed with patient. Counts are stable.  CA19.9 has trended down nicely, indicate good response.   # Fatigue, likely due to chemotherapy/worsenging anemia.  He is not enthusiastic about getting chemotherapy today.  Hold chemo today to allow him to recover.  Re-evaluate in one week for chemotherapy.- consider dose reduction.  Plan CT chest abdomen pelvis in 2 weeks.   #Pre-existing neuropathy, continue gabapentin 600 mg 3 times daily.   #Anemia, pre-existing and secondary to chemotherapy, worsened today. Continue to monitor.   I discussed with patient about genetic testing.  He is not interested.  Encourage patient to bring his wife to clinic visit and will discuss the issue issue.  All questions were answered. The patient knows to call the clinic with any problems questions or concerns.  cc Trinna Post, PA-C   Return of visit: 1 WEEK  follow-up for next cycle of treatments.  Earlie Server, MD, PhD Hematology Oncology [place] at [facility] 08/23/2019

## 2019-08-23 NOTE — Progress Notes (Signed)
[place]  [phone] [phone]   Name: Craig [name] Date: 08/23/2019 MRN: [id number]  DOB: [birth year]  Patient Care Team: Paulene Floor as PCP - General (Physician Assistant) Clent Jacks, RN as Oncology Nurse Navigator    REASON FOR CONSULTATION: Palliative Care consult requested for this 73 y.o. 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  Patient is being initiated on chemotherapy with gemcitabine and Abraxane.  He was referred to palliative care to help address goals and manage ongoing symptoms.   SOCIAL HISTORY:     reports that he quit smoking about 22 months ago. His smoking use included cigarettes. He has a 37.50 pack-year smoking history. He has quit using smokeless tobacco.  His smokeless tobacco use included chew. He reports previous alcohol use. He reports previous drug use. Drug: Marijuana.   Patient is married and lives at home with his wife.  He has a son and a daughter but reports that he is estranged from both.  He had another daughter who is now deceased.  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  Note that patient is illiterate.  He also says that his wife often will not answer the phone and recommends that we call and leave a message on the answering machine and then call her right back.  ADVANCE DIRECTIVES:  Does not have  CODE STATUS:   PAST MEDICAL HISTORY: Past Medical History:  Diagnosis Date  . Allergic rhinitis   . Cancer [facility])    new dx Pancreatic Cancer - Aug 2020  . CHF (congestive heart failure) ([place])   . Chronic cholecystitis   . Coronary artery disease   . DDD (degenerative disc disease), cervical   . Dehydration 06/21/2019  . Dyspnea   . Early cataract   . Erectile  dysfunction   . GERD (gastroesophageal reflux disease)   . Gouty arthritis   . Headache    occasional migraines  . Hypercalcemia   . Hyperlipemia   . Hypertension   . Palpitations   . Peripheral vascular disease ([place])   . 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   LIMA to LAD SVG to [place] SVG SEQUENTIALLY to OM1 and OM2 SVG to ACUTE MARGINAL  . Spinal stenosis of cervical region   . Vitamin D deficiency     PAST SURGICAL HISTORY:  Past Surgical History:  Procedure Laterality Date  . BACK SURGERY  2018    fusion with screws  . CHOLECYSTECTOMY N/A 11/13/2018   Procedure: LAPAROSCOPIC CHOLECYSTECTOMY;  Surgeon: Vickie Epley, MD;  Location: [facility] ORS;  Service: General;  Laterality: N/A;  . COLONOSCOPY    . CORONARY ARTERY BYPASS GRAFT N/A 09/30/2017   Procedure: CORONARY ARTERY BYPASS GRAFTING times five using right and left Saphaneous vein harvested endoscopicly  and left internal mammary artery. (CABG),TEE;  Surgeon: Rexene Alberts, MD;  Location: [place];  Service: Open Heart Surgery;  Laterality: N/A;  . LEFT HEART CATH AND CORONARY ANGIOGRAPHY Left 09/06/2017   Procedure: LEFT HEART CATH AND CORONARY ANGIOGRAPHY;  Surgeon: Corey Skains, MD;  Location: [place] CV LAB;  Service: Cardiovascular;  Laterality: Left;  . percutaneous transluminal balloon angioplasty  01/2010   of left lower extremity  . PORTA CATH INSERTION N/A 06/06/2019  Procedure: PORTA CATH INSERTION;  Surgeon: Katha Cabal, MD;  Location: [place] CV LAB;  Service: Cardiovascular;  Laterality: N/A;  . TEE WITHOUT CARDIOVERSION N/A 09/30/2017   Procedure: TRANSESOPHAGEAL ECHOCARDIOGRAM (TEE);  Surgeon: Rexene Alberts, MD;  Location: [place];  Service: Open Heart Surgery;  Laterality: N/A;  . VASCULAR SURGERY Left 2010   left exernal iliac and superficial femoral artery PTCA and stenting    HEMATOLOGY/ONCOLOGY HISTORY:  Oncology History  Pancreatic cancer metastasized to liver ([place])   06/01/2019 Initial Diagnosis   Pancreatic cancer metastasized to liver ([place])   06/07/2019 -  Chemotherapy   The patient had PACLitaxel-protein bound (ABRAXANE) chemo infusion 250 mg, 125 mg/m2 = 250 mg (100 % of original dose 125 mg/m2), Intravenous,  Once, 4 of 6 cycles Dose modification: 100 mg/m2 (original dose 125 mg/m2, Cycle 1, Reason: Provider Judgment), 125 mg/m2 (original dose 125 mg/m2, Cycle 1, Reason: Provider Judgment), 100 mg/m2 (original dose 125 mg/m2, Cycle 5, Reason: Dose not tolerated, Comment: neuropathy), 90 mg/m2 (original dose 125 mg/m2, Cycle 5, Reason: Dose not tolerated, Comment: neuropathy), 100 mg/m2 (original dose 125 mg/m2, Cycle 5, Reason: Dose not tolerated) Administration: 250 mg (06/07/2019), 250 mg (06/14/2019), 250 mg (06/21/2019), 200 mg (07/05/2019), 200 mg (07/12/2019), 200 mg (07/26/2019), 200 mg (08/02/2019), 200 mg (08/16/2019) gemcitabine (GEMZAR) 2,000 mg in sodium chloride 0.9 % 250 mL chemo infusion, 2,090 mg, Intravenous,  Once, 4 of 6 cycles Dose modification: 800 mg/m2 (original dose 1,000 mg/m2, Cycle 5, Reason: Dose not tolerated) Administration: 2,000 mg (06/07/2019), 2,000 mg (06/14/2019), 2,000 mg (07/05/2019), 1,600 mg (07/12/2019), 1,600 mg (07/26/2019), 1,600 mg (08/02/2019), 1,600 mg (08/16/2019)  for chemotherapy treatment.      ALLERGIES:  is allergic to lipitor [atorvastatin]; zetia [ezetimibe]; lisinopril; protonix [pantoprazole]; and spironolactone.  MEDICATIONS:  Current Outpatient Medications  Medication Sig Dispense Refill  . aspirin EC 81 MG tablet Take 81 mg by mouth daily.    . carvedilol (COREG) 6.25 MG tablet Take 6.25 mg by mouth 2 (two) times daily with a meal.  3  . diltiazem (CARDIZEM CD) 300 MG 24 hr capsule TAKE 1 CAPSULE BY MOUTH EVERY DAY 90 capsule 1  . dorzolamide-timolol (COSOPT) 22.3-6.8 MG/ML ophthalmic solution INSTILL ONE DROP INTO BOTH EYES TWICE DAILY    . ferrous sulfate 325 (65 FE) MG tablet TAKE 1 TABLET BY MOUTH  EVERY DAY WITH BREAKFAST 90 tablet 1  . gabapentin (NEURONTIN) 300 MG capsule TAKE 2 CAPSULES (600 MG TOTAL) BY MOUTH 3 (THREE) TIMES DAILY. 540 capsule 0  . latanoprost (XALATAN) 0.005 % ophthalmic solution Place 1 drop into both eyes at bedtime.   3  . Multiple Vitamin (MULTIVITAMIN WITH MINERALS) TABS tablet Take 1 tablet by mouth daily.    . OMEGA-3 FATTY ACIDS PO Take 1,200 mg by mouth daily.     [name] Kitchen omeprazole (PRILOSEC) 40 MG capsule TAKE 1 CAPSULE BY MOUTH EVERY DAY (Patient not taking: Reported on 08/22/2019) 90 capsule 0   No current facility-administered medications for this visit.    Facility-Administered Medications Ordered in Other Visits  Medication Dose Route Frequency Provider Last Rate Last Dose  . sodium chloride flush (NS) 0.9 % injection 10 mL  10 mL Intracatheter PRN Earlie Server, MD        VITAL SIGNS: There were no vitals taken for this visit. There were no vitals filed for this visit.  Estimated body mass index is 26.42 kg/m as calculated from the following:   Height as of 06/12/19:  5\' 9"  (1.753 m).   Weight as of 08/22/19: 178 lb 14.4 oz (81.1 kg).  LABS: CBC:    Component Value Date/Time   WBC 6.2 08/23/2019 0807   HGB 8.6 (L) 08/23/2019 0807   HGB 11.2 (L) 05/15/2019 1111   HCT 28.1 (L) 08/23/2019 0807   HCT 32.7 (L) 05/15/2019 1111   PLT 295 08/23/2019 0807   PLT 277 05/15/2019 1111   MCV 93.7 08/23/2019 0807   MCV 80 05/15/2019 1111   NEUTROABS 4.8 08/23/2019 0807   NEUTROABS 3.5 05/15/2019 1111   LYMPHSABS 0.6 (L) 08/23/2019 0807   LYMPHSABS 1.2 05/15/2019 1111   MONOABS 0.5 08/23/2019 0807   EOSABS 0.2 08/23/2019 0807   EOSABS 0.2 05/15/2019 1111   BASOSABS 0.1 08/23/2019 0807   BASOSABS 0.1 05/15/2019 1111   Comprehensive Metabolic Panel:    Component Value Date/Time   NA 132 (L) 08/23/2019 0807   NA 139 05/15/2019 1111   K 3.7 08/23/2019 0807   CL 106 08/23/2019 0807   CO2 20 (L) 08/23/2019 0807   BUN 12 08/23/2019 0807   BUN 23  05/15/2019 1111   CREATININE 1.07 08/23/2019 0807   CREATININE 0.95 06/17/2017 1035   GLUCOSE 154 (H) 08/23/2019 0807   CALCIUM 9.0 08/23/2019 0807   AST 33 08/23/2019 0807   ALT 44 08/23/2019 0807   ALKPHOS 51 08/23/2019 0807   BILITOT 0.7 08/23/2019 0807   BILITOT <0.2 05/15/2019 1111   PROT 6.9 08/23/2019 0807   PROT 6.8 05/15/2019 1111   ALBUMIN 3.1 (L) 08/23/2019 0807   ALBUMIN 4.3 05/15/2019 1111    RADIOGRAPHIC STUDIES: No results found.  PERFORMANCE STATUS (ECOG) : 0 - Asymptomatic  Review of Systems Unless otherwise noted, a complete review of systems is negative.  Physical Exam General: NAD, frail appearing, thin Pulmonary: unlabored Extremities: no edema, no joint deformities Skin: no rashes Neurological: Weakness but otherwise nonfocal  IMPRESSION: Routine follow-up visit made today.   Patient says that he has had more fatigue associated with chemotherapy recently.  Decision was made to withhold treatment today and patient says he is grateful.  He asks about going hunting.  Patient is requested to bring his wife to the next visit.  I again reviewed with him a MOST Form.  He says he does not think he would want resuscitation or other aggressive measures at end-of-life but patient did not want to complete the MOST form today.  Hopefully his wife attending the next visit may help facilitate decision-making.  Case and plan discussed with Dr. Tasia Catchings  PLAN: -Continue current scope of treatment -MOST Form previously discussed -RTC in 1 week   Patient expressed understanding and was in agreement with this plan. He also understands that He can call the clinic at any time with any questions, concerns, or complaints.     Time Total: 30 minutes  Visit consisted of counseling and education dealing with the complex and emotionally intense issues of symptom management and palliative care in the setting of serious and potentially life-threatening illness.Greater than 50%   of this time was spent counseling and coordinating care related to the above assessment and plan.  Signed by: Altha Harm, PhD, NP-C [phone] (Work Cell)

## 2019-08-23 NOTE — Progress Notes (Signed)
Pre-assessment done for MD visit.

## 2019-08-30 ENCOUNTER — Encounter: Payer: Self-pay | Admitting: Oncology

## 2019-08-30 ENCOUNTER — Other Ambulatory Visit: Payer: Self-pay

## 2019-08-30 NOTE — Progress Notes (Signed)
Patient prescreened for appointment. No concerns voiced.  

## 2019-08-31 ENCOUNTER — Other Ambulatory Visit: Payer: Self-pay

## 2019-08-31 ENCOUNTER — Inpatient Hospital Stay: Payer: Medicare Other

## 2019-08-31 ENCOUNTER — Inpatient Hospital Stay (HOSPITAL_BASED_OUTPATIENT_CLINIC_OR_DEPARTMENT_OTHER): Payer: Medicare Other | Admitting: Oncology

## 2019-08-31 ENCOUNTER — Inpatient Hospital Stay (HOSPITAL_BASED_OUTPATIENT_CLINIC_OR_DEPARTMENT_OTHER): Payer: Medicare Other | Admitting: Hospice and Palliative Medicine

## 2019-08-31 VITALS — BP 144/82 | HR 80 | Temp 98.6°F | Resp 18 | Wt 179.8 lb

## 2019-08-31 DIAGNOSIS — C787 Secondary malignant neoplasm of liver and intrahepatic bile duct: Secondary | ICD-10-CM

## 2019-08-31 DIAGNOSIS — Z7189 Other specified counseling: Secondary | ICD-10-CM | POA: Diagnosis not present

## 2019-08-31 DIAGNOSIS — T451X5A Adverse effect of antineoplastic and immunosuppressive drugs, initial encounter: Secondary | ICD-10-CM

## 2019-08-31 DIAGNOSIS — C259 Malignant neoplasm of pancreas, unspecified: Secondary | ICD-10-CM

## 2019-08-31 DIAGNOSIS — R5383 Other fatigue: Secondary | ICD-10-CM

## 2019-08-31 DIAGNOSIS — D6481 Anemia due to antineoplastic chemotherapy: Secondary | ICD-10-CM

## 2019-08-31 DIAGNOSIS — G629 Polyneuropathy, unspecified: Secondary | ICD-10-CM

## 2019-08-31 DIAGNOSIS — Z5111 Encounter for antineoplastic chemotherapy: Secondary | ICD-10-CM | POA: Diagnosis not present

## 2019-08-31 DIAGNOSIS — Z515 Encounter for palliative care: Secondary | ICD-10-CM

## 2019-08-31 LAB — COMPREHENSIVE METABOLIC PANEL
ALT: 33 U/L (ref 0–44)
AST: 28 U/L (ref 15–41)
Albumin: 3.3 g/dL — ABNORMAL LOW (ref 3.5–5.0)
Alkaline Phosphatase: 58 U/L (ref 38–126)
Anion gap: 7 (ref 5–15)
BUN: 14 mg/dL (ref 8–23)
CO2: 23 mmol/L (ref 22–32)
Calcium: 9.1 mg/dL (ref 8.9–10.3)
Chloride: 106 mmol/L (ref 98–111)
Creatinine, Ser: 1.1 mg/dL (ref 0.61–1.24)
GFR calc Af Amer: >60 mL/min (ref >60)
GFR calc non Af Amer: >60 mL/min (ref >60)
Glucose, Bld: 106 mg/dL — ABNORMAL HIGH (ref 70–99)
Potassium: 4.1 mmol/L (ref 3.5–5.1)
Sodium: 136 mmol/L (ref 135–145)
Total Bilirubin: 0.6 mg/dL (ref 0.3–1.2)
Total Protein: 7.1 g/dL (ref 6.5–8.1)

## 2019-08-31 LAB — CBC WITH DIFFERENTIAL/PLATELET
Abs Immature Granulocytes: 0.07 10*3/uL (ref 0.00–0.07)
Basophils Absolute: 0.1 10*3/uL (ref 0.0–0.1)
Basophils Relative: 3 %
Eosinophils Absolute: 0.4 10*3/uL (ref 0.0–0.5)
Eosinophils Relative: 8 %
HCT: 32.1 % — ABNORMAL LOW (ref 39.0–52.0)
Hemoglobin: 9.5 g/dL — ABNORMAL LOW (ref 13.0–17.0)
Immature Granulocytes: 1 %
Lymphocytes Relative: 14 %
Lymphs Abs: 0.7 10*3/uL (ref 0.7–4.0)
MCH: 28 pg (ref 26.0–34.0)
MCHC: 29.6 g/dL — ABNORMAL LOW (ref 30.0–36.0)
MCV: 94.7 fL (ref 80.0–100.0)
Monocytes Absolute: 1 10*3/uL (ref 0.1–1.0)
Monocytes Relative: 18 %
Neutro Abs: 3 10*3/uL (ref 1.7–7.7)
Neutrophils Relative %: 56 %
Platelets: 363 10*3/uL (ref 150–400)
RBC: 3.39 MIL/uL — ABNORMAL LOW (ref 4.22–5.81)
RDW: 17.6 % — ABNORMAL HIGH (ref 11.5–15.5)
WBC: 5.3 10*3/uL (ref 4.0–10.5)
nRBC: 0 % (ref 0.0–0.2)

## 2019-08-31 MED ORDER — SODIUM CHLORIDE 0.9 % IV SOLN
1600.0000 mg | Freq: Once | INTRAVENOUS | Status: AC
  Administered 2019-08-31: 1600 mg via INTRAVENOUS
  Filled 2019-08-31: qty 15.78

## 2019-08-31 MED ORDER — SODIUM CHLORIDE 0.9% FLUSH
10.0000 mL | Freq: Once | INTRAVENOUS | Status: AC
  Administered 2019-08-31: 10 mL via INTRAVENOUS
  Filled 2019-08-31: qty 10

## 2019-08-31 MED ORDER — PACLITAXEL PROTEIN-BOUND CHEMO INJECTION 100 MG
100.0000 mg/m2 | Freq: Once | INTRAVENOUS | Status: AC
  Administered 2019-08-31: 200 mg via INTRAVENOUS
  Filled 2019-08-31: qty 40

## 2019-08-31 MED ORDER — PROCHLORPERAZINE MALEATE 10 MG PO TABS
10.0000 mg | ORAL_TABLET | Freq: Once | ORAL | Status: AC
  Administered 2019-08-31: 10 mg via ORAL
  Filled 2019-08-31: qty 1

## 2019-08-31 MED ORDER — HEPARIN SOD (PORK) LOCK FLUSH 100 UNIT/ML IV SOLN
500.0000 [IU] | Freq: Once | INTRAVENOUS | Status: AC | PRN
  Administered 2019-08-31: 500 [IU]
  Filled 2019-08-31 (×2): qty 5

## 2019-08-31 MED ORDER — SODIUM CHLORIDE 0.9 % IV SOLN
Freq: Once | INTRAVENOUS | Status: AC
  Administered 2019-08-31: 10:00:00 via INTRAVENOUS
  Filled 2019-08-31: qty 250

## 2019-08-31 NOTE — Progress Notes (Signed)
[place]  [phone] [phone]   Name: Craig [name] Date: 08/31/2019 MRN: [id number]  DOB: Nov 19, 1945  Patient Care Team: Paulene Floor as PCP - General (Physician Assistant) Clent Jacks, RN as Oncology Nurse Navigator    REASON FOR CONSULTATION: Palliative Care consult requested for this 73 y.o. 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  Patient is being initiated on chemotherapy with gemcitabine and Abraxane.  He was referred to palliative care to help address goals and manage ongoing symptoms.   SOCIAL HISTORY:     reports that he quit smoking about 23 months ago. His smoking use included cigarettes. He has a 37.50 pack-year smoking history. He has quit using smokeless tobacco.  His smokeless tobacco use included chew. He reports previous alcohol use. He reports previous drug use. Drug: Marijuana.   Patient is married and lives at home with his wife.  He has a son and a daughter but reports that he is estranged from both.  He had another daughter who is now deceased.  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  Note that patient is illiterate.  He also says that his wife often will not answer the phone and recommends that we call and leave a message on the answering machine and then call her right back.  ADVANCE DIRECTIVES:  Does not have  CODE STATUS: DNR/DNI (MOST form completed on 08/31/2019)  PAST MEDICAL HISTORY: Past Medical History:  Diagnosis Date  . Allergic rhinitis   . Cancer [facility])    new dx Pancreatic Cancer - Aug 2020  . CHF (congestive heart failure) ([place])   . Chronic cholecystitis   . Coronary artery disease   . DDD (degenerative disc disease), cervical   . Dehydration 06/21/2019  .  Dyspnea   . Early cataract   . Erectile dysfunction   . GERD (gastroesophageal reflux disease)   . Gouty arthritis   . Headache    occasional migraines  . Hypercalcemia   . Hyperlipemia   . Hypertension   . Palpitations   . Peripheral vascular disease ([place])   . 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   LIMA to LAD SVG to [place] SVG SEQUENTIALLY to OM1 and OM2 SVG to ACUTE MARGINAL  . Spinal stenosis of cervical region   . Vitamin D deficiency     PAST SURGICAL HISTORY:  Past Surgical History:  Procedure Laterality Date  . BACK SURGERY  2018    fusion with screws  . CHOLECYSTECTOMY N/A 11/13/2018   Procedure: LAPAROSCOPIC CHOLECYSTECTOMY;  Surgeon: Vickie Epley, MD;  Location: [facility] ORS;  Service: General;  Laterality: N/A;  . COLONOSCOPY    . CORONARY ARTERY BYPASS GRAFT N/A 09/30/2017   Procedure: CORONARY ARTERY BYPASS GRAFTING times five using right and left Saphaneous vein harvested endoscopicly  and left internal mammary artery. (CABG),TEE;  Surgeon: Rexene Alberts, MD;  Location: [place];  Service: Open Heart Surgery;  Laterality: N/A;  . LEFT HEART CATH AND CORONARY ANGIOGRAPHY Left 09/06/2017   Procedure: LEFT HEART CATH AND CORONARY ANGIOGRAPHY;  Surgeon: Corey Skains, MD;  Location: [place] CV LAB;  Service: Cardiovascular;  Laterality: Left;  . percutaneous transluminal balloon angioplasty  01/2010   of left lower extremity  . PORTA CATH  INSERTION N/A 06/06/2019   Procedure: PORTA CATH INSERTION;  Surgeon: Katha Cabal, MD;  Location: [place] CV LAB;  Service: Cardiovascular;  Laterality: N/A;  . TEE WITHOUT CARDIOVERSION N/A 09/30/2017   Procedure: TRANSESOPHAGEAL ECHOCARDIOGRAM (TEE);  Surgeon: Rexene Alberts, MD;  Location: [place];  Service: Open Heart Surgery;  Laterality: N/A;  . VASCULAR SURGERY Left 2010   left exernal iliac and superficial femoral artery PTCA and stenting    HEMATOLOGY/ONCOLOGY HISTORY:  Oncology History  Pancreatic  cancer metastasized to liver ([place])  06/01/2019 Initial Diagnosis   Pancreatic cancer metastasized to liver ([place])   06/07/2019 -  Chemotherapy   The patient had PACLitaxel-protein bound (ABRAXANE) chemo infusion 250 mg, 125 mg/m2 = 250 mg (100 % of original dose 125 mg/m2), Intravenous,  Once, 4 of 6 cycles Dose modification: 100 mg/m2 (original dose 125 mg/m2, Cycle 1, Reason: Provider Judgment), 125 mg/m2 (original dose 125 mg/m2, Cycle 1, Reason: Provider Judgment), 100 mg/m2 (original dose 125 mg/m2, Cycle 5, Reason: Dose not tolerated, Comment: neuropathy), 90 mg/m2 (original dose 125 mg/m2, Cycle 5, Reason: Dose not tolerated, Comment: neuropathy), 100 mg/m2 (original dose 125 mg/m2, Cycle 5, Reason: Dose not tolerated) Administration: 250 mg (06/07/2019), 250 mg (06/14/2019), 250 mg (06/21/2019), 200 mg (07/05/2019), 200 mg (07/12/2019), 200 mg (07/26/2019), 200 mg (08/02/2019), 200 mg (08/16/2019) gemcitabine (GEMZAR) 2,000 mg in sodium chloride 0.9 % 250 mL chemo infusion, 2,090 mg, Intravenous,  Once, 4 of 6 cycles Dose modification: 800 mg/m2 (original dose 1,000 mg/m2, Cycle 5, Reason: Dose not tolerated) Administration: 2,000 mg (06/07/2019), 2,000 mg (06/14/2019), 2,000 mg (07/05/2019), 1,600 mg (07/12/2019), 1,600 mg (07/26/2019), 1,600 mg (08/02/2019), 1,600 mg (08/16/2019)  for chemotherapy treatment.      ALLERGIES:  is allergic to lipitor [atorvastatin]; zetia [ezetimibe]; lisinopril; protonix [pantoprazole]; and spironolactone.  MEDICATIONS:  Current Outpatient Medications  Medication Sig Dispense Refill  . aspirin EC 81 MG tablet Take 81 mg by mouth daily.    . carvedilol (COREG) 6.25 MG tablet Take 6.25 mg by mouth 2 (two) times daily with a meal.  3  . diltiazem (CARDIZEM CD) 300 MG 24 hr capsule TAKE 1 CAPSULE BY MOUTH EVERY DAY 90 capsule 1  . dorzolamide-timolol (COSOPT) 22.3-6.8 MG/ML ophthalmic solution INSTILL ONE DROP INTO BOTH EYES TWICE DAILY    . ferrous sulfate 325 (65 FE)  MG tablet TAKE 1 TABLET BY MOUTH EVERY DAY WITH BREAKFAST 90 tablet 1  . gabapentin (NEURONTIN) 300 MG capsule TAKE 2 CAPSULES (600 MG TOTAL) BY MOUTH 3 (THREE) TIMES DAILY. 540 capsule 0  . latanoprost (XALATAN) 0.005 % ophthalmic solution Place 1 drop into both eyes at bedtime.   3  . Multiple Vitamin (MULTIVITAMIN WITH MINERALS) TABS tablet Take 1 tablet by mouth daily.    . OMEGA-3 FATTY ACIDS PO Take 1,200 mg by mouth daily.     [name] Kitchen omeprazole (PRILOSEC) 40 MG capsule TAKE 1 CAPSULE BY MOUTH EVERY DAY 90 capsule 0   No current facility-administered medications for this visit.    Facility-Administered Medications Ordered in Other Visits  Medication Dose Route Frequency Provider Last Rate Last Dose  . gemcitabine (GEMZAR) 1,600 mg in sodium chloride 0.9 % 250 mL chemo infusion  1,600 mg Intravenous Once Earlie Server, MD      . heparin lock flush 100 unit/mL  500 Units Intracatheter Once PRN Earlie Server, MD      . PACLitaxel-protein bound (ABRAXANE) chemo infusion 200 mg  100 mg/m2 (Order-Specific) Intravenous Once Earlie Server,  MD 80 mL/hr at 08/31/19 1057 200 mg at 08/31/19 1057  . sodium chloride flush (NS) 0.9 % injection 10 mL  10 mL Intracatheter PRN Earlie Server, MD        VITAL SIGNS: There were no vitals taken for this visit. There were no vitals filed for this visit.  Estimated body mass index is 26.55 kg/m as calculated from the following:   Height as of 06/12/19: 5\' 9"  (1.753 m).   Weight as of an earlier encounter on 08/31/19: 179 lb 12.8 oz (81.6 kg).  LABS: CBC:    Component Value Date/Time   WBC 5.3 08/31/2019 0817   HGB 9.5 (L) 08/31/2019 0817   HGB 11.2 (L) 05/15/2019 1111   HCT 32.1 (L) 08/31/2019 0817   HCT 32.7 (L) 05/15/2019 1111   PLT 363 08/31/2019 0817   PLT 277 05/15/2019 1111   MCV 94.7 08/31/2019 0817   MCV 80 05/15/2019 1111   NEUTROABS 3.0 08/31/2019 0817   NEUTROABS 3.5 05/15/2019 1111   LYMPHSABS 0.7 08/31/2019 0817   LYMPHSABS 1.2 05/15/2019 1111   MONOABS  1.0 08/31/2019 0817   EOSABS 0.4 08/31/2019 0817   EOSABS 0.2 05/15/2019 1111   BASOSABS 0.1 08/31/2019 0817   BASOSABS 0.1 05/15/2019 1111   Comprehensive Metabolic Panel:    Component Value Date/Time   NA 136 08/31/2019 0817   NA 139 05/15/2019 1111   K 4.1 08/31/2019 0817   CL 106 08/31/2019 0817   CO2 23 08/31/2019 0817   BUN 14 08/31/2019 0817   BUN 23 05/15/2019 1111   CREATININE 1.10 08/31/2019 0817   CREATININE 0.95 06/17/2017 1035   GLUCOSE 106 (H) 08/31/2019 0817   CALCIUM 9.1 08/31/2019 0817   AST 28 08/31/2019 0817   ALT 33 08/31/2019 0817   ALKPHOS 58 08/31/2019 0817   BILITOT 0.6 08/31/2019 0817   BILITOT <0.2 05/15/2019 1111   PROT 7.1 08/31/2019 0817   PROT 6.8 05/15/2019 1111   ALBUMIN 3.3 (L) 08/31/2019 0817   ALBUMIN 4.3 05/15/2019 1111    RADIOGRAPHIC STUDIES: No results found.  PERFORMANCE STATUS (ECOG) : 0 - Asymptomatic  Review of Systems Unless otherwise noted, a complete review of systems is negative.  Physical Exam General: NAD, frail appearing, thin Pulmonary: unlabored Extremities: no edema, no joint deformities Skin: no rashes Neurological: Weakness but otherwise nonfocal  IMPRESSION: Routine follow-up visit made today.  Patient was accompanied by his wife.  Patient reports that overall he is doing well.  He denies any acute changes or concerns.  He has had peripheral neuropathy and may ultimately require dose reduction of his Abraxane.  Patient is on gabapentin 600 mg 3 times daily.  Could consider adding duloxetine.  We discussed CODE STATUS.  Patient says that he would not want to be resuscitated or have his life prolonged artificially on machines and his wife agrees with those decisions.  He would want to be a DNR/DNI.  Patient would be in agreement with short-term hospitalization if necessary to treat the treatable.  I completed a MOST form today. The patient and family outlined their wishes for the following treatment decisions:   Cardiopulmonary Resuscitation: Do Not Attempt Resuscitation (DNR/No CPR)  Medical Interventions: Limited Additional Interventions: Use medical treatment, IV fluids and cardiac monitoring as indicated, DO NOT USE intubation or mechanical ventilation. May consider use of less invasive airway support such as BiPAP or CPAP. Also provide comfort measures. Transfer to the hospital if indicated. Avoid intensive care.   Antibiotics: Antibiotics  if indicated  IV Fluids: IV fluids if indicated  Feeding Tube: No feeding tube   Case and plan discussed with Dr. Tasia Catchings  PLAN: -Continue current scope of treatment -DNR/DNI -MOST form completed -Follow up telephone visit in 2-3 weeks   Patient expressed understanding and was in agreement with this plan. He also understands that He can call the clinic at any time with any questions, concerns, or complaints.     Time Total: 20 minutes  Visit consisted of counseling and education dealing with the complex and emotionally intense issues of symptom management and palliative care in the setting of serious and potentially life-threatening illness.Greater than 50%  of this time was spent counseling and coordinating care related to the above assessment and plan.  Signed by: Altha Harm, PhD, NP-C [phone] (Work Cell)

## 2019-08-31 NOTE — Progress Notes (Signed)
Hematology/Oncology  Follow up note [facility] Telephone:([phone] [phone]   Patient Care Team: Paulene Floor as PCP - General (Physician Assistant) Clent Jacks, RN as Oncology Nurse Navigator  REFERRING PROVIDER: Trinna Post, PA-C  CHIEF COMPLAINTS/REASON FOR VISIT:  Follow up for treatment of pancreatic cancer  HISTORY OF PRESENTING ILLNESS:   Craig [name] is a  73 y.o.  male with PMH listed below, including CABG x5, PVD, hypertension, former tobacco abuse, former alcohol abuse, iron deficiency anemia, was seen in consultation at the request of  Terrilee Croak, Adriana M, PA-C  for evaluation of abnormal CT Patient recently presented to primary care provider complaining for abdominal pain radiating to his back for about 10 days.  Patient uses Tylenol as needed for pain. Work-up showed elevated amylase, lipase, mild anemia with hemoglobin of 11.3 Acute hepatitis panel negative. 05/16/2019 CT abdomen pelvis with contrast showed poorly marginated heterogeneous hypodense 2.9 cm pancreatic mass at the uncinate process, worrisome for primary pancreatic cancer.  No biliary or pancreatic duct dilation. Several ill defined hypodense liver masses scattered throughout the liver, largest 3.1 cm in the segment 6 right liver lobe. Nonspecific portacaval and aortocaval adenopathy. Extreme medial left lung base 4.4 cm pleural-based mass probably a pleural metastasis.  Additional tiny pulmonary nodules scattered at the right lung base are indeterminate. Mild prostate or megaly  Patient was referred to heme-onc for further evaluation. Patient reports that his abdominal pain was 10 out of 10 a few days ago, he uses Tylenol as needed. Today his pain is getting better, 2 out of 10.  Denies any nausea, vomiting, diarrhea, fever or chills. Married, lives with wife.  Appetite is good.  He weighs 195 pounds today at the clinic. His weight was 204 pounds  back in May 2020.  He had a history of 37.5-pack-year smoking history quitting 2018. No longer drinking alcohol.  # ultrasound-guided liver biopsy on 05/21/2019.  Pathology showed fragments of benign hepatic parenchyma showing minimal macro vascular steatosis, otherwise no significant histo pathologic change. Single core of renal tissue showing approximately 25% glomerulosclerosis  # #History of CAD, status post CABG x5.  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  # Omniseq test showed PD-L1 50% TPS, TMB high, negative for BRCA1/2 or NTRK fusion.  MSI could not be completed.   INTERVAL HISTORY Craig [name] is a 73 y.o. male who has above history reviewed by me today presents for follow up for evaluation prior to chemotherapy. Problems and complaints are listed below: Patient was accompanied by wife today. Chemotherapy was held last week due to profound fatigue. Patient reported that fatigue has improved since last visit.   He has no new complaints today. Continues to have numbness/tingling of his toes in the fingertips.  He has sensation of walking on soft service. He is taking gabapentin 600 mg 3 times daily Denies any fever, chills, cough, nausea vomiting .[name] Kitchen    Review of Systems  Constitutional: Positive for fatigue and unexpected weight change. Negative for appetite change, chills and fever.  HENT:   Negative for hearing loss and voice change.   Eyes: Negative for eye problems and icterus.  Respiratory: Negative for chest tightness, cough and shortness of breath.   Cardiovascular: Negative for chest pain and leg swelling.  Gastrointestinal: Negative for abdominal distention and abdominal pain.  Endocrine: Negative for hot flashes.  Genitourinary:  Negative for difficulty urinating, dysuria and frequency.   Musculoskeletal: Negative for arthralgias.   Skin: Negative for itching and rash.  Neurological: Positive for numbness. Negative for light-headedness.  Hematological: Negative for adenopathy. Does not bruise/bleed easily.  Psychiatric/Behavioral: Negative for confusion.    MEDICAL HISTORY:  Past Medical History:  Diagnosis Date  . Allergic rhinitis   . Cancer [facility])    new dx Pancreatic Cancer - Aug 2020  . CHF (congestive heart failure) ([place])   . Chronic cholecystitis   . Coronary artery disease   . DDD (degenerative disc disease), cervical   . Dehydration 06/21/2019  . Dyspnea   . Early cataract   . Erectile dysfunction   . GERD (gastroesophageal reflux disease)   . Gouty arthritis   . Headache    occasional migraines  . Hypercalcemia   . Hyperlipemia   . Hypertension   . Palpitations   . Peripheral vascular disease ([place])   . 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   LIMA to LAD SVG to [place] SVG SEQUENTIALLY to OM1 and OM2 SVG to ACUTE MARGINAL  . Spinal stenosis of cervical region   . Vitamin D deficiency     SURGICAL HISTORY: Past Surgical History:  Procedure Laterality Date  . BACK SURGERY  2018    fusion with screws  . CHOLECYSTECTOMY N/A 11/13/2018   Procedure: LAPAROSCOPIC CHOLECYSTECTOMY;  Surgeon: Vickie Epley, MD;  Location: [facility];  Service: General;  Laterality: N/A;  . COLONOSCOPY    . CORONARY ARTERY BYPASS GRAFT N/A 09/30/2017   Procedure: CORONARY ARTERY BYPASS GRAFTING times five using right and left Saphaneous vein harvested endoscopicly  and left internal mammary artery. (CABG),TEE;  Surgeon: Rexene Alberts, MD;  Location: [place];  Service: Open Heart Surgery;  Laterality: N/A;  . LEFT HEART CATH AND CORONARY ANGIOGRAPHY Left 09/06/2017   Procedure: LEFT HEART CATH AND CORONARY ANGIOGRAPHY;  Surgeon: Corey Skains, MD;  Location: [place] [facility];  Service: Cardiovascular;  Laterality: Left;  . percutaneous transluminal balloon angioplasty  01/2010   of left lower extremity  . PORTA  CATH INSERTION N/A 06/06/2019   Procedure: PORTA CATH INSERTION;  Surgeon: Katha Cabal, MD;  Location: [place] [facility];  Service: Cardiovascular;  Laterality: N/A;  . TEE WITHOUT CARDIOVERSION N/A 09/30/2017   Procedure: TRANSESOPHAGEAL ECHOCARDIOGRAM (TEE);  Surgeon: Rexene Alberts, MD;  Location: [place];  Service: Open Heart Surgery;  Laterality: N/A;  . VASCULAR SURGERY Left 2010   left exernal iliac and superficial femoral artery PTCA and stenting    SOCIAL HISTORY: Social History   Socioeconomic History  . Marital status: Married    Spouse name: Enid Derry  . Number of children: 2  . Years of education: Not on file  . Highest education level: Not on file  Occupational History  . 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 Financial resource strain: Not hard at all  . Food insecurity    Worry: Never true    Inability: Never true  . Transportation needs    Medical: No    Non-medical: No  Tobacco Use  . Smoking status: Former Smoker    Packs/day: 0.75    Years: 50.00    Pack years: 37.50    Types: Cigarettes    Quit date: 09/27/2017    Years since quitting: 1.9  . Smokeless tobacco: Former Systems developer    Types: Chew  Substance and Sexual Activity  . Alcohol  use: Not Currently    Comment: stopped drinking before cabg  . Drug use: Not Currently    Types: Marijuana    Comment: stopped smoking before CABG  . Sexual activity: Not Currently  Lifestyle  . Physical activity    Days per week: 5 days    Minutes per session: 150+ min  . Stress: Not at all  Relationships  . Social connections    Talks on phone: More than three times a week    Gets together: More than three times a week    Attends religious service: Never    Active member of club or organization: No    Attends meetings of clubs or organizations: Never    Relationship status: Married  . Intimate partner violence    Fear of current or ex partner: Not on file    Emotionally abused:  Not on file    Physically abused: Not on file    Forced sexual activity: Not on file  Other Topics Concern  . Not on file  Social History Narrative  . Not on file    FAMILY HISTORY: Family History  Problem Relation Age of Onset  . Brain cancer Mother   . Emphysema Father   . Cancer Brother     ALLERGIES:  is allergic to lipitor [atorvastatin]; zetia [ezetimibe]; lisinopril; protonix [pantoprazole]; and spironolactone.  MEDICATIONS:  Current Outpatient Medications  Medication Sig Dispense Refill  . aspirin EC 81 MG tablet Take 81 mg by mouth daily.    . carvedilol (COREG) 6.25 MG tablet Take 6.25 mg by mouth 2 (two) times daily with a meal.  3  . diltiazem (CARDIZEM CD) 300 MG 24 hr capsule TAKE 1 CAPSULE BY MOUTH EVERY DAY 90 capsule 1  . dorzolamide-timolol (COSOPT) 22.3-6.8 MG/ML ophthalmic solution INSTILL ONE DROP INTO BOTH EYES TWICE DAILY    . ferrous sulfate 325 (65 FE) MG tablet TAKE 1 TABLET BY MOUTH EVERY DAY WITH BREAKFAST 90 tablet 1  . gabapentin (NEURONTIN) 300 MG capsule TAKE 2 CAPSULES (600 MG TOTAL) BY MOUTH 3 (THREE) TIMES DAILY. 540 capsule 0  . latanoprost (XALATAN) 0.005 % ophthalmic solution Place 1 drop into both eyes at bedtime.   3  . Multiple Vitamin (MULTIVITAMIN WITH MINERALS) TABS tablet Take 1 tablet by mouth daily.    . OMEGA-3 FATTY ACIDS PO Take 1,200 mg by mouth daily.     [name] Kitchen omeprazole (PRILOSEC) 40 MG capsule TAKE 1 CAPSULE BY MOUTH EVERY DAY 90 capsule 0   No current facility-administered medications for this visit.    Facility-Administered Medications Ordered in Other Visits  Medication Dose Route Frequency Provider Last Rate Last Dose  . sodium chloride flush (NS) 0.9 % injection 10 mL  10 mL Intracatheter PRN Earlie Server, MD         PHYSICAL EXAMINATION: ECOG PERFORMANCE STATUS: 1 - Symptomatic but completely ambulatory Vitals:   08/31/19 0855  BP: (!) 144/82  Pulse: 80  Resp: 18  Temp: 98.6 F (37 C)   Filed Weights   08/31/19  0855  Weight: 179 lb 12.8 oz (81.6 kg)    Physical Exam Constitutional:      General: He is not in acute distress.    Comments: Walk in   HENT:     Head: Normocephalic and atraumatic.  Eyes:     General: No scleral icterus.    Pupils: Pupils are equal, round, and reactive to light.  Neck:     Musculoskeletal: Normal range of  motion and neck supple.  Cardiovascular:     Rate and Rhythm: Normal rate and regular rhythm.     Heart sounds: Normal heart sounds.  Pulmonary:     Effort: Pulmonary effort is normal. No respiratory distress.     Breath sounds: No wheezing.  Abdominal:     General: Bowel sounds are normal. There is no distension.     Palpations: Abdomen is soft. There is no mass.     Tenderness: There is no abdominal tenderness.  Musculoskeletal: Normal range of motion.        General: No deformity.  Skin:    General: Skin is warm and dry.     Findings: No erythema or rash.  Neurological:     General: No focal deficit present.     Mental Status: He is alert and oriented to person, place, and time.     Cranial Nerves: No cranial nerve deficit.     Coordination: Coordination normal.  Psychiatric:        Behavior: Behavior normal.        Thought Content: Thought content normal.     LABORATORY DATA:  I have reviewed the data as listed Lab Results  Component Value Date   WBC 5.3 08/31/2019   HGB 9.5 (L) 08/31/2019   HCT 32.1 (L) 08/31/2019   MCV 94.7 08/31/2019   PLT 363 08/31/2019   Recent Labs    08/16/19 0820 08/23/19 0807 08/31/19 0817  NA 138 132* 136  K 3.6 3.7 4.1  CL 108 106 106  CO2 21* 20* 23  GLUCOSE 161* 154* 106*  BUN '11 12 14  '$ CREATININE 0.94 1.07 1.10  CALCIUM 9.0 9.0 9.1  GFRNONAA >60 >60 >60  GFRAA >60 >60 >60  PROT 6.6 6.9 7.1  ALBUMIN 3.2* 3.1* 3.3*  AST 33 33 28  ALT 52* 44 33  ALKPHOS 62 51 58  BILITOT 0.7 0.7 0.6   Iron/TIBC/Ferritin/ %Sat    Component Value Date/Time   IRON 119 04/27/2019 0940   TIBC 363 04/27/2019  0940   FERRITIN 58 04/27/2019 0940   IRONPCTSAT 33 04/27/2019 0940      RADIOGRAPHIC STUDIES: I have personally reviewed the radiological images as listed and agreed with the findings in the report. Nm Pet Image Initial (pi) Skull Base To Thigh  Result Date: 06/04/2019 CLINICAL DATA:  Initial treatment strategy for stage IV pancreatic cancer with biopsy confirmed liver metastasis. EXAM: NUCLEAR MEDICINE PET SKULL BASE TO THIGH TECHNIQUE: 11.0 mCi F-18 FDG was injected intravenously. Full-ring PET imaging was performed from the skull base to thigh after the radiotracer. CT data was obtained and used for attenuation correction and anatomic localization. Fasting blood glucose: 123 mg/dl COMPARISON:  05/16/2019 CT abdomen/pelvis. 05/10/2019 screening chest CT. FINDINGS: Mediastinal blood pool activity: SUV max 3.2 Liver activity: SUV max NA NECK: No hypermetabolic lymph nodes in the neck. Incidental CT findings: none CHEST: Bilateral hypermetabolic hilar foci with max SUV 5.2 on the left and 3.5 on the right. No enlarged or hypermetabolic axillary or mediastinal lymph nodes. No hypermetabolic pulmonary findings. A few scattered solid right pulmonary nodules, largest 4 mm in the basilar right lower lobe (series 3/image 125), below PET resolution. The pleural based solid density 4.3 x 2.3 cm structure at the extreme medial left lung base (series 3/image 129) is non hypermetabolic. Incidental CT findings: Three-vessel coronary atherosclerosis status post CABG. Atherosclerotic nonaneurysmal thoracic aorta. Dilated main pulmonary artery (3.6 cm diameter). Intact sternotomy wires. ABDOMEN/PELVIS: Intensely hypermetabolic  uncinate process pancreatic mass measuring approximately 4.2 x 2.6 cm with max SUV 14.4 (series 3/image 163). Enlarged hypermetabolic 1.4 cm portacaval node with max SUV 10.5 (series 3/image 155). Several small hypermetabolic aortocaval and left para-aortic lymph nodes, largest 1.0 cm in the  aortocaval chain with max SUV 6.2 (series 3/image 171). Several (at least 5) hypermetabolic liver masses scattered throughout the liver, largest 3.5 cm in the segment 6 right liver lobe with max SUV 12.6 (series 3/image 152), 2.4 cm in the central segment 5 right liver lobe with max SUV 13.6 (series 3/image 147) and 2.3 cm in posterior right liver lobe adjacent to the IVC with max SUV 10.9 (series 3/image 147). No abnormal hypermetabolic activity within the adrenal glands or spleen. No hypermetabolic lymph nodes in the pelvis. Incidental CT findings: Cholecystectomy. Simple 4.6 cm lower right renal cyst. Simple 1.5 cm interpolar left renal cyst. Atherosclerotic nonaneurysmal abdominal aorta. SKELETON: Faint hypermetabolism in the medial right iliac bone with associated faint sclerosis with max SUV 3.3 (series 3/image 215). No additional hypermetabolic osseous foci. Incidental CT findings: none IMPRESSION: 1. Intensely hypermetabolic uncinate process pancreatic mass, compatible with primary pancreatic adenocarcinoma. 2. Hypermetabolic portacaval, aortocaval and left para-aortic nodal metastases. Bilateral hilar nodal hypermetabolism is also compatible with nodal metastatic disease. 3. Several (at least 5) hypermetabolic liver metastases scattered throughout the liver. 4. Equivocal faint hypermetabolism in the medial right iliac bone with associated faint sclerosis, osseous metastasis not excluded, although potentially degenerative. 5. Tiny scattered right pulmonary nodules, below PET resolution, recommend attention on follow-up chest CT in 3-6 months. 6. Chronic findings include: Aortic Atherosclerosis (ICD10-I70.0). Dilated main pulmonary artery, suggesting pulmonary arterial hypertension. Electronically Signed   By: Ilona Sorrel M.D.   On: 06/04/2019 13:56      ASSESSMENT & PLAN:  1. Pancreatic cancer metastasized to liver ([place])   2. Anemia due to antineoplastic chemotherapy   3. Encounter for  antineoplastic chemotherapy   4. Fatigue, unspecified type   5. Neuropathy   6. Goals of care, counseling/discussion    #Metastatic pancreatic cancer- liver mets, hilar nodal metastasis, ?bone mets, Labs are reviewed and discussed with patient. Counts are acceptable to proceed with day 8 gemcitabine and Abraxane. CA 19-9 has been trending down nicely.  Indicating good response. Plan obtaining CT abdomen pelvis with contrast for documentation of radiographic response.  #Fatigue, improved.  Likely due to chemotherapy/worsening of anemia. #Anemia, hemoglobin improved compared to last week.  Continue to monitor. 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  Continue gabapentin 600 mg 3 times daily. #Goals of care, palliative.  I had a lengthy discussion with both wife and patient.  They both understand that patient's condition is not curable.  All questions answered to their satisfaction.  Follow-up with palliative care. I discussed about genetic testing with patient and wife..  Patient declined. All questions were answered. The patient knows to call the clinic with any problems questions or concerns.  cc Trinna Post, PA-C  Orders Placed This Encounter  Procedures  . CT Abdomen Pelvis W Contrast    Standing Status:   Future    Standing Expiration Date:   08/30/2020    Order Specific Question:   If indicated for the ordered procedure, I authorize the administration of contrast media per Radiology protocol    Answer:   Yes    Order Specific Question:   Preferred imaging location?    Answer:   [facility]    Order Specific Question:   Is Oral Contrast  requested for this exam?    Answer:   Yes, Per Radiology protocol    Order Specific Question:   Radiology Contrast Protocol - do NOT remove file path    Answer:   [email or web address]    Return of visit: 2 weeks for cycle 5 chemotherapy evaluation.  Earlie Server, MD, PhD Hematology Oncology [place] at [facility] 08/31/2019

## 2019-09-03 ENCOUNTER — Telehealth: Payer: Self-pay | Admitting: *Deleted

## 2019-09-03 NOTE — Progress Notes (Signed)
Patient: Craig [name] Male    DOB: Dec 03, 1945   73 y.o.   MRN: [id number] Visit Date: 09/04/2019  Today's Provider: Trinna Post, PA-C   Chief Complaint  Patient presents with  . Ear Pain   Subjective:     HPI Patient presents today for ear problems since Sunday, 09/02/2019. Patient states that his left ear fills stopped up and both ear has a popping sound. Patient states that he is not having any ear pain or other symptoms at the moment.  Wt Readings from Last 3 Encounters:  09/04/19 175 lb 6.4 oz (79.6 kg)  08/31/19 179 lb 12.8 oz (81.6 kg)  08/22/19 178 lb 14.4 oz (81.1 kg)      Allergies  Allergen Reactions  . Lipitor [Atorvastatin] Other (See Comments)    MYALGIA  . Zetia [Ezetimibe] Other (See Comments)    MYALGIA  . Lisinopril Cough  . Protonix [Pantoprazole] Other (See Comments)    headache  . Spironolactone Other (See Comments)    Breast tenderness     Current Outpatient Medications:  .  aspirin EC 81 MG tablet, Take 81 mg by mouth daily., Disp: , Rfl:  .  carvedilol (COREG) 6.25 MG tablet, Take 6.25 mg by mouth 2 (two) times daily with a meal., Disp: , Rfl: 3 .  diltiazem (CARDIZEM CD) 300 MG 24 hr capsule, TAKE 1 CAPSULE BY MOUTH EVERY DAY, Disp: 90 capsule, Rfl: 1 .  dorzolamide-timolol (COSOPT) 22.3-6.8 MG/ML ophthalmic solution, INSTILL ONE DROP INTO BOTH EYES TWICE DAILY, Disp: , Rfl:  .  ferrous sulfate 325 (65 FE) MG tablet, TAKE 1 TABLET BY MOUTH EVERY DAY WITH BREAKFAST, Disp: 90 tablet, Rfl: 1 .  gabapentin (NEURONTIN) 300 MG capsule, TAKE 2 CAPSULES (600 MG TOTAL) BY MOUTH 3 (THREE) TIMES DAILY., Disp: 540 capsule, Rfl: 0 .  latanoprost (XALATAN) 0.005 % ophthalmic solution, Place 1 drop into both eyes at bedtime. , Disp: , Rfl: 3 .  Multiple Vitamin (MULTIVITAMIN WITH MINERALS) TABS tablet, Take 1 tablet by mouth daily., Disp: , Rfl:  .  OMEGA-3 FATTY ACIDS PO, Take 1,200 mg by mouth daily. , Disp: , Rfl:  .  omeprazole (PRILOSEC)  40 MG capsule, TAKE 1 CAPSULE BY MOUTH EVERY DAY, Disp: 90 capsule, Rfl: 0 No current facility-administered medications for this visit.   Facility-Administered Medications Ordered in Other Visits:  .  sodium chloride flush (NS) 0.9 % injection 10 mL, 10 mL, Intracatheter, PRN, Earlie Server, MD  Review of Systems  Social History   Tobacco Use  . Smoking status: Former Smoker    Packs/day: 0.75    Years: 50.00    Pack years: 37.50    Types: Cigarettes    Quit date: 09/27/2017    Years since quitting: 1.9  . Smokeless tobacco: Former Systems developer    Types: Chew  Substance Use Topics  . Alcohol use: Not Currently    Comment: stopped drinking before cabg      Objective:   BP (!) 108/59 (BP Location: Left Arm, Patient Position: Sitting, Cuff Size: Normal)   Pulse 97   Temp (!) 96.9 F (36.1 C) (Temporal)   Wt 175 lb 6.4 oz (79.6 kg)   BMI 25.90 kg/m  Vitals:   09/04/19 0902  BP: (!) 108/59  Pulse: 97  Temp: (!) 96.9 F (36.1 C)  TempSrc: Temporal  Weight: 175 lb 6.4 oz (79.6 kg)  Body mass index is 25.9 kg/m.   Physical Exam Constitutional:  Appearance: Normal appearance.  HENT:     Ears:     Comments: Some wax in right ear but TM is visible. Left TM is completely occluded by wax. This resolves after lavage.  Neurological:     Mental Status: He is alert and oriented to person, place, and time. Mental status is at baseline.  Psychiatric:        Mood and Affect: Mood normal.        Behavior: Behavior normal.      No results found for any visits on 09/04/19.     Assessment & Plan    1. Impacted cerumen of left ear  Resolved after lavage.   The entirety of the information documented in the History of Present Illness, Review of Systems and Physical Exam were personally obtained by me. Portions of this information were initially documented by [facility], CMA and reviewed by me for thoroughness and accuracy.   F/u PRN        Trinna Post, PA-C   [facility]

## 2019-09-03 NOTE — Telephone Encounter (Signed)
Patient called reporting that he awoke yesterday and his ear is "stopped up". He is asking what he should do for it. Please advise

## 2019-09-03 NOTE — Telephone Encounter (Signed)
Patient does not want a phone visit, he thinks he has a wax plug as this has happened before and his PCP washed his ear out and it helped. He said he does not see his PCP any more and to forget about it. He is going to go to [facility] and get something over the counter to clean his ear out

## 2019-09-03 NOTE — Telephone Encounter (Signed)
We aren't able to flush ears here at this clinic. Recommend he see his pcp or he can try otc debrox drops. If symptoms don't improve, please notify us.

## 2019-09-03 NOTE — Telephone Encounter (Signed)
He does not have video capability on his phone. Please advise

## 2019-09-03 NOTE — Telephone Encounter (Signed)
Please schedule for virtual Symptom Management appointment. Thanks!

## 2019-09-04 ENCOUNTER — Encounter: Payer: Self-pay | Admitting: Physician Assistant

## 2019-09-04 ENCOUNTER — Other Ambulatory Visit: Payer: Self-pay

## 2019-09-04 ENCOUNTER — Ambulatory Visit (INDEPENDENT_AMBULATORY_CARE_PROVIDER_SITE_OTHER): Payer: Medicare Other | Admitting: Physician Assistant

## 2019-09-04 VITALS — BP 108/59 | HR 97 | Temp 96.9°F | Wt 175.4 lb

## 2019-09-04 DIAGNOSIS — H6122 Impacted cerumen, left ear: Secondary | ICD-10-CM

## 2019-09-04 NOTE — Patient Instructions (Signed)
Earwax Buildup, Adult The ears produce a substance called earwax that helps keep bacteria out of the ear and protects the skin in the ear canal. Occasionally, earwax can build up in the ear and cause discomfort or hearing loss. What increases the risk? This condition is more likely to develop in people who:  Are male.  Are elderly.  Naturally produce more earwax.  Clean their ears often with cotton swabs.  Use earplugs often.  Use in-ear headphones often.  Wear hearing aids.  Have narrow ear canals.  Have earwax that is overly thick or sticky.  Have eczema.  Are dehydrated.  Have excess hair in the ear canal. What are the signs or symptoms? Symptoms of this condition include:  Reduced or muffled hearing.  A feeling of fullness in the ear or feeling that the ear is plugged.  Fluid coming from the ear.  Ear pain.  Ear itch.  Ringing in the ear.  Coughing.  An obvious piece of earwax that can be seen inside the ear canal. How is this diagnosed? This condition may be diagnosed based on:  Your symptoms.  Your medical history.  An ear exam. During the exam, your health care provider will look into your ear with an instrument called an otoscope. You may have tests, including a hearing test. How is this treated? This condition may be treated by:  Using ear drops to soften the earwax.  Having the earwax removed by a health care provider. The health care provider may: ? Flush the ear with water. ? Use an instrument that has a loop on the end (curette). ? Use a suction device.  Surgery to remove the wax buildup. This may be done in severe cases. Follow these instructions at home:   Take over-the-counter and prescription medicines only as told by your health care provider.  Do not put any objects, including cotton swabs, into your ear. You can clean the opening of your ear canal with a washcloth or facial tissue.  Follow instructions from your health care  provider about cleaning your ears. Do not over-clean your ears.  Drink enough fluid to keep your urine clear or pale yellow. This will help to thin the earwax.  Keep all follow-up visits as told by your health care provider. If earwax builds up in your ears often or if you use hearing aids, consider seeing your health care provider for routine, preventive ear cleanings. Ask your health care provider how often you should schedule your cleanings.  If you have hearing aids, clean them according to instructions from the manufacturer and your health care provider. Contact a health care provider if:  You have ear pain.  You develop a fever.  You have blood, pus, or other fluid coming from your ear.  You have hearing loss.  You have ringing in your ears that does not go away.  Your symptoms do not improve with treatment.  You feel like the room is spinning (vertigo). Summary  Earwax can build up in the ear and cause discomfort or hearing loss.  The most common symptoms of this condition include reduced or muffled hearing and a feeling of fullness in the ear or feeling that the ear is plugged.  This condition may be diagnosed based on your symptoms, your medical history, and an ear exam.  This condition may be treated by using ear drops to soften the earwax or by having the earwax removed by a health care provider.  Do not put any   objects, including cotton swabs, into your ear. You can clean the opening of your ear canal with a washcloth or facial tissue. This information is not intended to replace advice given to you by your health care provider. Make sure you discuss any questions you have with your health care provider. Document Released: 11/04/2004 Document Revised: 09/09/2017 Document Reviewed: 12/08/2016 [employer] Patient Education  2020 [employer]  

## 2019-09-05 ENCOUNTER — Telehealth: Payer: Self-pay | Admitting: *Deleted

## 2019-09-05 NOTE — Telephone Encounter (Signed)
Patient called and reports he feels his bp is low and that he gets dizzy when he stands. I advised that he go to the ER but he stated " I don't feel that bad" I advised that he increase his fluid intake and if he does not fell better to please go to ER

## 2019-09-05 NOTE — Telephone Encounter (Signed)
Agreed.  Thank you

## 2019-09-10 ENCOUNTER — Ambulatory Visit
Admission: RE | Admit: 2019-09-10 | Discharge: 2019-09-10 | Disposition: A | Discharge status: Home / Self Care | Payer: Medicare Other | Source: Ambulatory Visit | Attending: Oncology | Admitting: Oncology

## 2019-09-10 ENCOUNTER — Other Ambulatory Visit: Payer: Self-pay

## 2019-09-10 DIAGNOSIS — C259 Malignant neoplasm of pancreas, unspecified: Secondary | ICD-10-CM

## 2019-09-10 DIAGNOSIS — C787 Secondary malignant neoplasm of liver and intrahepatic bile duct: Secondary | ICD-10-CM | POA: Insufficient documentation

## 2019-09-10 MED ORDER — IOHEXOL 300 MG/ML  SOLN
100.0000 mL | Freq: Once | INTRAMUSCULAR | Status: AC | PRN
  Administered 2019-09-10: 100 mL via INTRAVENOUS

## 2019-09-13 ENCOUNTER — Encounter: Payer: Self-pay | Admitting: Oncology

## 2019-09-13 ENCOUNTER — Other Ambulatory Visit: Payer: Self-pay

## 2019-09-13 NOTE — Progress Notes (Signed)
Patient prescreened for appointment. He states that on Tuesday, he was feeling dizzy whenever stood up and believed bp was high. He feels better today.

## 2019-09-14 ENCOUNTER — Inpatient Hospital Stay: Payer: Medicare Other | Attending: Oncology

## 2019-09-14 ENCOUNTER — Inpatient Hospital Stay: Payer: Medicare Other

## 2019-09-14 ENCOUNTER — Other Ambulatory Visit: Payer: Self-pay

## 2019-09-14 ENCOUNTER — Inpatient Hospital Stay (HOSPITAL_BASED_OUTPATIENT_CLINIC_OR_DEPARTMENT_OTHER): Payer: Medicare Other | Admitting: Oncology

## 2019-09-14 VITALS — BP 174/77 | HR 85 | Temp 98.2°F | Resp 16 | Wt 179.5 lb

## 2019-09-14 DIAGNOSIS — Z9049 Acquired absence of other specified parts of digestive tract: Secondary | ICD-10-CM | POA: Insufficient documentation

## 2019-09-14 DIAGNOSIS — Z888 Allergy status to other drugs, medicaments and biological substances status: Secondary | ICD-10-CM | POA: Insufficient documentation

## 2019-09-14 DIAGNOSIS — Z87891 Personal history of nicotine dependence: Secondary | ICD-10-CM | POA: Insufficient documentation

## 2019-09-14 DIAGNOSIS — E785 Hyperlipidemia, unspecified: Secondary | ICD-10-CM | POA: Insufficient documentation

## 2019-09-14 DIAGNOSIS — N400 Enlarged prostate without lower urinary tract symptoms: Secondary | ICD-10-CM | POA: Diagnosis not present

## 2019-09-14 DIAGNOSIS — Z5111 Encounter for antineoplastic chemotherapy: Secondary | ICD-10-CM | POA: Diagnosis not present

## 2019-09-14 DIAGNOSIS — Z79899 Other long term (current) drug therapy: Secondary | ICD-10-CM | POA: Insufficient documentation

## 2019-09-14 DIAGNOSIS — R599 Enlarged lymph nodes, unspecified: Secondary | ICD-10-CM | POA: Insufficient documentation

## 2019-09-14 DIAGNOSIS — C7951 Secondary malignant neoplasm of bone: Secondary | ICD-10-CM | POA: Insufficient documentation

## 2019-09-14 DIAGNOSIS — C787 Secondary malignant neoplasm of liver and intrahepatic bile duct: Secondary | ICD-10-CM | POA: Diagnosis not present

## 2019-09-14 DIAGNOSIS — T451X5A Adverse effect of antineoplastic and immunosuppressive drugs, initial encounter: Secondary | ICD-10-CM | POA: Insufficient documentation

## 2019-09-14 DIAGNOSIS — G629 Polyneuropathy, unspecified: Secondary | ICD-10-CM

## 2019-09-14 DIAGNOSIS — K219 Gastro-esophageal reflux disease without esophagitis: Secondary | ICD-10-CM | POA: Insufficient documentation

## 2019-09-14 DIAGNOSIS — R160 Hepatomegaly, not elsewhere classified: Secondary | ICD-10-CM | POA: Insufficient documentation

## 2019-09-14 DIAGNOSIS — Z95828 Presence of other vascular implants and grafts: Secondary | ICD-10-CM

## 2019-09-14 DIAGNOSIS — R5383 Other fatigue: Secondary | ICD-10-CM

## 2019-09-14 DIAGNOSIS — R109 Unspecified abdominal pain: Secondary | ICD-10-CM | POA: Insufficient documentation

## 2019-09-14 DIAGNOSIS — Z809 Family history of malignant neoplasm, unspecified: Secondary | ICD-10-CM | POA: Insufficient documentation

## 2019-09-14 DIAGNOSIS — C771 Secondary and unspecified malignant neoplasm of intrathoracic lymph nodes: Secondary | ICD-10-CM | POA: Insufficient documentation

## 2019-09-14 DIAGNOSIS — J90 Pleural effusion, not elsewhere classified: Secondary | ICD-10-CM | POA: Insufficient documentation

## 2019-09-14 DIAGNOSIS — C259 Malignant neoplasm of pancreas, unspecified: Secondary | ICD-10-CM

## 2019-09-14 DIAGNOSIS — E042 Nontoxic multinodular goiter: Secondary | ICD-10-CM | POA: Diagnosis not present

## 2019-09-14 DIAGNOSIS — I2510 Atherosclerotic heart disease of native coronary artery without angina pectoris: Secondary | ICD-10-CM | POA: Insufficient documentation

## 2019-09-14 DIAGNOSIS — R5382 Chronic fatigue, unspecified: Secondary | ICD-10-CM | POA: Diagnosis not present

## 2019-09-14 DIAGNOSIS — I700 Atherosclerosis of aorta: Secondary | ICD-10-CM | POA: Insufficient documentation

## 2019-09-14 DIAGNOSIS — D6481 Anemia due to antineoplastic chemotherapy: Secondary | ICD-10-CM

## 2019-09-14 DIAGNOSIS — I110 Hypertensive heart disease with heart failure: Secondary | ICD-10-CM | POA: Diagnosis not present

## 2019-09-14 DIAGNOSIS — I739 Peripheral vascular disease, unspecified: Secondary | ICD-10-CM | POA: Diagnosis not present

## 2019-09-14 DIAGNOSIS — Z808 Family history of malignant neoplasm of other organs or systems: Secondary | ICD-10-CM | POA: Insufficient documentation

## 2019-09-14 DIAGNOSIS — Z836 Family history of other diseases of the respiratory system: Secondary | ICD-10-CM | POA: Insufficient documentation

## 2019-09-14 DIAGNOSIS — N281 Cyst of kidney, acquired: Secondary | ICD-10-CM | POA: Diagnosis not present

## 2019-09-14 LAB — CBC WITH DIFFERENTIAL/PLATELET
Abs Immature Granulocytes: 0.05 10*3/uL (ref 0.00–0.07)
Basophils Absolute: 0.1 10*3/uL (ref 0.0–0.1)
Basophils Relative: 1 %
Eosinophils Absolute: 0.3 10*3/uL (ref 0.0–0.5)
Eosinophils Relative: 5 %
HCT: 31.6 % — ABNORMAL LOW (ref 39.0–52.0)
Hemoglobin: 9.3 g/dL — ABNORMAL LOW (ref 13.0–17.0)
Immature Granulocytes: 1 %
Lymphocytes Relative: 14 %
Lymphs Abs: 0.8 10*3/uL (ref 0.7–4.0)
MCH: 28.4 pg (ref 26.0–34.0)
MCHC: 29.4 g/dL — ABNORMAL LOW (ref 30.0–36.0)
MCV: 96.3 fL (ref 80.0–100.0)
Monocytes Absolute: 0.9 10*3/uL (ref 0.1–1.0)
Monocytes Relative: 16 %
Neutro Abs: 3.5 10*3/uL (ref 1.7–7.7)
Neutrophils Relative %: 63 %
Platelets: 401 10*3/uL — ABNORMAL HIGH (ref 150–400)
RBC: 3.28 MIL/uL — ABNORMAL LOW (ref 4.22–5.81)
RDW: 17.9 % — ABNORMAL HIGH (ref 11.5–15.5)
WBC: 5.5 10*3/uL (ref 4.0–10.5)
nRBC: 0 % (ref 0.0–0.2)

## 2019-09-14 LAB — COMPREHENSIVE METABOLIC PANEL
ALT: 24 U/L (ref 0–44)
AST: 20 U/L (ref 15–41)
Albumin: 3.2 g/dL — ABNORMAL LOW (ref 3.5–5.0)
Alkaline Phosphatase: 62 U/L (ref 38–126)
Anion gap: 9 (ref 5–15)
BUN: 17 mg/dL (ref 8–23)
CO2: 22 mmol/L (ref 22–32)
Calcium: 9 mg/dL (ref 8.9–10.3)
Chloride: 106 mmol/L (ref 98–111)
Creatinine, Ser: 0.88 mg/dL (ref 0.61–1.24)
GFR calc Af Amer: >60 mL/min (ref >60)
GFR calc non Af Amer: >60 mL/min (ref >60)
Glucose, Bld: 173 mg/dL — ABNORMAL HIGH (ref 70–99)
Potassium: 4.2 mmol/L (ref 3.5–5.1)
Sodium: 137 mmol/L (ref 135–145)
Total Bilirubin: 0.5 mg/dL (ref 0.3–1.2)
Total Protein: 7.2 g/dL (ref 6.5–8.1)

## 2019-09-14 MED ORDER — SODIUM CHLORIDE 0.9 % IV SOLN
2000.0000 mg | Freq: Once | INTRAVENOUS | Status: AC
  Administered 2019-09-14: 2000 mg via INTRAVENOUS
  Filled 2019-09-14: qty 52.6

## 2019-09-14 MED ORDER — SODIUM CHLORIDE 0.9 % IV SOLN
Freq: Once | INTRAVENOUS | Status: AC
  Administered 2019-09-14: 10:00:00 via INTRAVENOUS
  Filled 2019-09-14: qty 250

## 2019-09-14 MED ORDER — HEPARIN SOD (PORK) LOCK FLUSH 100 UNIT/ML IV SOLN
500.0000 [IU] | Freq: Once | INTRAVENOUS | Status: AC | PRN
  Administered 2019-09-14: 500 [IU]
  Filled 2019-09-14: qty 5

## 2019-09-14 MED ORDER — PROCHLORPERAZINE MALEATE 10 MG PO TABS
10.0000 mg | ORAL_TABLET | Freq: Once | ORAL | Status: AC
  Administered 2019-09-14: 10 mg via ORAL
  Filled 2019-09-14: qty 1

## 2019-09-14 MED ORDER — SODIUM CHLORIDE 0.9% FLUSH
10.0000 mL | Freq: Once | INTRAVENOUS | Status: AC
  Administered 2019-09-14: 10 mL via INTRAVENOUS
  Filled 2019-09-14: qty 10

## 2019-09-14 NOTE — Progress Notes (Signed)
Increasing gemzar dose to 1000mg /m2 per MD, no abraxane today.

## 2019-09-15 LAB — CANCER ANTIGEN 19-9: CA 19-9: 209 U/mL — ABNORMAL HIGH (ref 0–35)

## 2019-09-15 NOTE — Progress Notes (Signed)
Hematology/Oncology  Follow up note [facility] [phone] [phone]   Patient Care Team: Paulene Floor as PCP - General (Physician Assistant) Clent Jacks, RN as Oncology Nurse Navigator  REFERRING PROVIDER: Trinna Post, PA-C  CHIEF COMPLAINTS/REASON FOR VISIT:  Follow up for treatment of pancreatic cancer  HISTORY OF PRESENTING ILLNESS:   Craig [name] is a  73 y.o.  male with PMH listed below, including CABG x5, PVD, hypertension, former tobacco abuse, former alcohol abuse, iron deficiency anemia, was seen in consultation at the request of  Terrilee Croak, Adriana M, PA-C  for evaluation of abnormal CT Patient recently presented to primary care provider complaining for abdominal pain radiating to his back for about 10 days.  Patient uses Tylenol as needed for pain. Work-up showed elevated amylase, lipase, mild anemia with hemoglobin of 11.3 Acute hepatitis panel negative. 05/16/2019 CT abdomen pelvis with contrast showed poorly marginated heterogeneous hypodense 2.9 cm pancreatic mass at the uncinate process, worrisome for primary pancreatic cancer.  No biliary or pancreatic duct dilation. Several ill defined hypodense liver masses scattered throughout the liver, largest 3.1 cm in the segment 6 right liver lobe. Nonspecific portacaval and aortocaval adenopathy. Extreme medial left lung base 4.4 cm pleural-based mass probably a pleural metastasis.  Additional tiny pulmonary nodules scattered at the right lung base are indeterminate. Mild prostate or megaly  Patient was referred to heme-onc for further evaluation. Patient reports that his abdominal pain was 10 out of 10 a few days ago, he uses Tylenol as needed. Today his pain is getting better, 2 out of 10.  Denies any nausea, vomiting, diarrhea, fever or chills. Married, lives with wife.  Appetite is good.  He weighs 195 pounds today at the clinic. His weight was 204 pounds  back in May 2020.  He had a history of 37.5-pack-year smoking history quitting 2018. No longer drinking alcohol.  # ultrasound-guided liver biopsy on 05/21/2019.  Pathology showed fragments of benign hepatic parenchyma showing minimal macro vascular steatosis, otherwise no significant histo pathologic change. Single core of renal tissue showing approximately 25% glomerulosclerosis  # #History of CAD, status post CABG x5.  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  # Omniseq test showed PD-L1 50% TPS, TMB high, negative for BRCA1/2 or NTRK fusion.  MSI could not be completed.   INTERVAL HISTORY Rohit [name] is a 73 y.o. male who has above history reviewed by me today presents for follow up for evaluation prior to chemotherapy. Problems and complaints are listed below: Patient reports feeling dizzy for a few days ago.  Currently he does not feel any dizziness. He denies any fever, chills, nausea or vomiting. Continue to have numbness and tingling of toes and fingertips.  He has sensation of walking on soft surface He takes gabapentin 600 mg 3 times daily.  Does not feel symptom has improved any. Chronic fatigue unchanged. Appetite is good.  Weight is stable. ..    Review of Systems  Constitutional: Positive for fatigue. Negative for appetite change, chills, fever and unexpected weight change.  HENT:   Negative for hearing loss and voice change.   Eyes: Negative for eye problems and icterus.  Respiratory: Negative for chest tightness, cough and shortness of breath.   Cardiovascular: Negative for chest pain and leg swelling.  Gastrointestinal: Negative for abdominal distention and abdominal pain.  Endocrine: Negative for hot flashes.  Genitourinary: Negative for difficulty urinating, dysuria and frequency.   Musculoskeletal: Negative for arthralgias.  Skin:  Negative for itching and rash.  Neurological: Positive for numbness. Negative for light-headedness.  Hematological: Negative for adenopathy. Does not bruise/bleed easily.  Psychiatric/Behavioral: Negative for confusion.    MEDICAL HISTORY:  Past Medical History:  Diagnosis Date   Allergic rhinitis    Cancer [facility])    new dx Pancreatic Cancer - Aug 2020   CHF (congestive heart failure) (HCC)    Chronic cholecystitis    Coronary artery disease    DDD (degenerative disc disease), cervical    Dehydration 06/21/2019   Dyspnea    Early cataract    Erectile dysfunction    GERD (gastroesophageal reflux disease)    Gouty arthritis    Headache    occasional migraines   Hypercalcemia    Hyperlipemia    Hypertension    Palpitations    Peripheral vascular disease (HCC)    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   LIMA to LAD SVG to [place] SVG SEQUENTIALLY to OM1 and OM2 SVG to ACUTE MARGINAL   Spinal stenosis of cervical region    Vitamin D deficiency     SURGICAL HISTORY: Past Surgical History:  Procedure Laterality Date   BACK SURGERY  2018    fusion with screws   CHOLECYSTECTOMY N/A 11/13/2018   Procedure: LAPAROSCOPIC CHOLECYSTECTOMY;  Surgeon: Vickie Epley, MD;  Location: [facility] ORS;  Service: General;  Laterality: N/A;   COLONOSCOPY     CORONARY ARTERY BYPASS GRAFT N/A 09/30/2017   Procedure: CORONARY ARTERY BYPASS GRAFTING times five using right and left Saphaneous vein harvested endoscopicly  and left internal mammary artery. (CABG),TEE;  Surgeon: Rexene Alberts, MD;  Location: [place];  Service: Open Heart Surgery;  Laterality: N/A;   LEFT HEART CATH AND CORONARY ANGIOGRAPHY Left 09/06/2017   Procedure: LEFT HEART CATH AND CORONARY ANGIOGRAPHY;  Surgeon: Corey Skains, MD;  Location: [place] [facility];  Service: Cardiovascular;  Laterality: Left;   percutaneous transluminal balloon angioplasty  01/2010   of left lower extremity   PORTA CATH  INSERTION N/A 06/06/2019   Procedure: PORTA CATH INSERTION;  Surgeon: Katha Cabal, MD;  Location: [place] [facility];  Service: Cardiovascular;  Laterality: N/A;   TEE WITHOUT CARDIOVERSION N/A 09/30/2017   Procedure: TRANSESOPHAGEAL ECHOCARDIOGRAM (TEE);  Surgeon: Rexene Alberts, MD;  Location: [place];  Service: Open Heart Surgery;  Laterality: N/A;   VASCULAR SURGERY Left 2010   left exernal iliac and superficial femoral artery PTCA and stenting    SOCIAL HISTORY: Social History   Socioeconomic History   Marital status: Married    Spouse name: Enid Derry   Number of children: 2   Years of education: Not on file   Highest education level: Not on file  Occupational History   Occupation: drove tractors    Comment: retired  Scientist, product/process development strain: Not hard at [employer] insecurity    Worry: Never true    Inability: Never true   Transportation needs    Medical: No    Non-medical: No  Tobacco Use   Smoking status: Former Smoker    Packs/day: 0.75    Years: 50.00    Pack years: 37.50    Types: Cigarettes    Quit date: 09/27/2017    Years since quitting: 1.9   Smokeless tobacco: Former Systems developer    Types: Chew  Substance and Sexual Activity  Alcohol use: Not Currently    Comment: stopped drinking before cabg   Drug use: Not Currently    Types: Marijuana    Comment: stopped smoking before CABG   Sexual activity: Not Currently  Lifestyle   Physical activity    Days per week: 5 days    Minutes per session: 150+ min   Stress: Not at all  Relationships   Social connections    Talks on phone: More than three times a week    Gets together: More than three times a week    Attends religious service: Never    Active member of club or organization: No    Attends meetings of clubs or organizations: Never    Relationship status: Married   Intimate partner violence    Fear of current or ex partner: Not on file    Emotionally abused: Not  on file    Physically abused: Not on file    Forced sexual activity: Not on file  Other Topics Concern   Not on file  Social History Narrative   Not on file    FAMILY HISTORY: Family History  Problem Relation Age of Onset   Brain cancer Mother    Emphysema Father    Cancer Brother     ALLERGIES:  is allergic to lipitor [atorvastatin]; zetia [ezetimibe]; lisinopril; protonix [pantoprazole]; and spironolactone.  MEDICATIONS:  Current Outpatient Medications  Medication Sig Dispense Refill   aspirin EC 81 MG tablet Take 81 mg by mouth daily.     carvedilol (COREG) 6.25 MG tablet Take 6.25 mg by mouth 2 (two) times daily with a meal.  3   diltiazem (CARDIZEM CD) 300 MG 24 hr capsule TAKE 1 CAPSULE BY MOUTH EVERY DAY 90 capsule 1   dorzolamide-timolol (COSOPT) 22.3-6.8 MG/ML ophthalmic solution INSTILL ONE DROP INTO BOTH EYES TWICE DAILY     ferrous sulfate 325 (65 FE) MG tablet TAKE 1 TABLET BY MOUTH EVERY DAY WITH BREAKFAST 90 tablet 1   gabapentin (NEURONTIN) 300 MG capsule TAKE 2 CAPSULES (600 MG TOTAL) BY MOUTH 3 (THREE) TIMES DAILY. 540 capsule 0   latanoprost (XALATAN) 0.005 % ophthalmic solution Place 1 drop into both eyes at bedtime.   3   lidocaine-prilocaine (EMLA) cream APPLY TO AFFECTED AREA ONCE AS DIRECTED     Multiple Vitamin (MULTIVITAMIN WITH MINERALS) TABS tablet Take 1 tablet by mouth daily.     OMEGA-3 FATTY ACIDS PO Take 1,200 mg by mouth daily.      omeprazole (PRILOSEC) 40 MG capsule TAKE 1 CAPSULE BY MOUTH EVERY DAY 90 capsule 0   No current facility-administered medications for this visit.    Facility-Administered Medications Ordered in Other Visits  Medication Dose Route Frequency Provider Last Rate Last Dose   sodium chloride flush (NS) 0.9 % injection 10 mL  10 mL Intracatheter PRN Earlie Server, MD         PHYSICAL EXAMINATION: ECOG PERFORMANCE STATUS: 1 - Symptomatic but completely ambulatory Vitals:   09/14/19 0909  BP: (!) 174/77    Pulse: 85  Resp: 16  Temp: 98.2 F (36.8 C)  SpO2: 98%   Filed Weights   09/14/19 0909  Weight: 179 lb 8 oz (81.4 kg)    Physical Exam Constitutional:      General: He is not in acute distress.    Comments: Walk in   HENT:     Head: Normocephalic and atraumatic.  Eyes:     General: No scleral icterus.  Pupils: Pupils are equal, round, and reactive to light.  Neck:     Musculoskeletal: Normal range of motion and neck supple.  Cardiovascular:     Rate and Rhythm: Normal rate and regular rhythm.     Heart sounds: Normal heart sounds.  Pulmonary:     Effort: Pulmonary effort is normal. No respiratory distress.     Breath sounds: No wheezing.  Abdominal:     General: Bowel sounds are normal. There is no distension.     Palpations: Abdomen is soft. There is no mass.     Tenderness: There is no abdominal tenderness.  Musculoskeletal: Normal range of motion.        General: No deformity.  Skin:    General: Skin is warm and dry.     Findings: No erythema or rash.  Neurological:     General: No focal deficit present.     Mental Status: He is alert and oriented to person, place, and time.     Cranial Nerves: No cranial nerve deficit.     Coordination: Coordination normal.  Psychiatric:        Behavior: Behavior normal.        Thought Content: Thought content normal.     LABORATORY DATA:  I have reviewed the data as listed Lab Results  Component Value Date   WBC 5.5 09/14/2019   HGB 9.3 (L) 09/14/2019   HCT 31.6 (L) 09/14/2019   MCV 96.3 09/14/2019   PLT 401 (H) 09/14/2019   Recent Labs    08/23/19 0807 08/31/19 0817 09/14/19 0843  NA 132* 136 137  K 3.7 4.1 4.2  CL 106 106 106  CO2 20* 23 22  GLUCOSE 154* 106* 173*  BUN '12 14 17  '$ CREATININE 1.07 1.10 0.88  CALCIUM 9.0 9.1 9.0  GFRNONAA >60 >60 >60  GFRAA >60 >60 >60  PROT 6.9 7.1 7.2  ALBUMIN 3.1* 3.3* 3.2*  AST 33 28 20  ALT 44 33 24  ALKPHOS 51 58 62  BILITOT 0.7 0.6 0.5   Iron/TIBC/Ferritin/  %Sat    Component Value Date/Time   IRON 119 04/27/2019 0940   TIBC 363 04/27/2019 0940   FERRITIN 58 04/27/2019 0940   IRONPCTSAT 33 04/27/2019 0940      RADIOGRAPHIC STUDIES: I have personally reviewed the radiological images as listed and agreed with the findings in the report. Ct Chest W Contrast  Result Date: 09/10/2019 CLINICAL DATA:  Restaging metastatic pancreatic cancer. EXAM: CT CHEST, ABDOMEN, AND PELVIS WITH CONTRAST TECHNIQUE: Multidetector CT imaging of the chest, abdomen and pelvis was performed following the standard protocol during bolus administration of intravenous contrast. CONTRAST:  141m OMNIPAQUE IOHEXOL 300 MG/ML  SOLN COMPARISON:  PET-CT 06/04/2019 FINDINGS: CT CHEST FINDINGS Cardiovascular: The heart is normal in size. No pericardial effusion. Stable tortuosity and advanced atherosclerotic calcifications involving the thoracic aorta. 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 Stable advanced three-vessel coronary artery calcifications. Normal appearance of the pulmonary arteries. Mediastinum/Nodes: Stable small scattered mediastinal and hilar lymph nodes. 7.5 mm right paratracheal node on image 11/504. 6 mm right paratracheal node on image 18/504. 5 mm prevascular lymph node on image 18/504. 6.5 mm subcarinal lymph node on image 26/504. The esophagus is grossly normal. Stable small scattered thyroid nodules. Lungs/Pleura: Stable emphysematous changes. No acute pulmonary findings. A few tiny scattered subpleural pulmonary nodules are stable and likely benign. Musculoskeletal: No chest wall masses are identified. Right-sided Port-A-Cath is stable. No supraclavicular or axillary adenopathy. No worrisome  bone lesions to suggest metastatic disease. CT ABDOMEN PELVIS FINDINGS Hepatobiliary: No hepatic lesions are identified. The small hepatic metastatic lesions have resolved. No new lesions. The portal and hepatic veins are patent. The gallbladder is surgically  absent. No common bile duct dilatation. Pancreas: Ill-defined lesion in the lower pancreatic head/uncinate process measures approximately 3 x 3 cm and appears relatively stable. The pancreatic body and tail appear normal. Spleen: Normal size.  No focal lesions. Adrenals/Urinary Tract: The adrenal glands and kidneys are unremarkable. Stable renal cysts. The bladder is unremarkable. Stomach/Bowel: The stomach, duodenum, small bowel and colon are grossly normal. No acute inflammatory changes, mass lesions or obstructive findings. The terminal ileum and appendix are normal. Vascular/Lymphatic: Stable advanced atherosclerotic calcifications involving the aorta, iliac arteries and branch vessels. No aneurysm or dissection. Marked interval reduction in size of the hypermetabolic upper abdominal lymph nodes. 7 mm node between the portal vein and IVC on image 56/504 previously measured 14.5 mm. 5.5 mm interaortocaval lymph node on image 65/504 previously measured 10.5 cm. Reproductive: Enlarged prostate gland with median lobe hypertrophy impressing on the base of the bladder. The seminal vesicles appear normal. Other: Small amount of free pelvic fluid is noted. No inguinal mass or adenopathy. 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 Musculoskeletal: No significant bony findings. Stable lumbar fusion hardware. IMPRESSION: 1. CT findings suggest a good response to treatment. No discernible residual hepatic metastatic lesions are identified. 2. Significant interval reduction in size of the abdominal lymph nodes. The pancreatic lesions difficult to measure exactly but appears relatively stable in size. 3. Stable small scattered mediastinal and hilar lymph nodes. 4. Stable small scattered subpleural pulmonary nodules, likely benign. 5. Stable pleural lesion at the left lung base, this is likely benign fibrous tumor  of the pleura and was not hypermetabolic on the previous PET-CT. Electronically Signed   By: Marijo Sanes M.D.   On: 09/10/2019 10:01   Ct Abdomen Pelvis W Contrast  Result Date: 09/10/2019 CLINICAL DATA:  Restaging metastatic pancreatic cancer. EXAM: CT CHEST, ABDOMEN, AND PELVIS WITH CONTRAST TECHNIQUE: Multidetector CT imaging of the chest, abdomen and pelvis was performed following the standard protocol during bolus administration of intravenous contrast. CONTRAST:  179m OMNIPAQUE IOHEXOL 300 MG/ML  SOLN COMPARISON:  PET-CT 06/04/2019 FINDINGS: CT CHEST FINDINGS Cardiovascular: The heart is normal in size. No pericardial effusion. Stable tortuosity and advanced atherosclerotic calcifications involving the thoracic aorta. 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 Stable advanced three-vessel coronary artery calcifications. Normal appearance of the pulmonary arteries. Mediastinum/Nodes: Stable small scattered mediastinal and hilar lymph nodes. 7.5 mm right paratracheal node on image 11/504. 6 mm right paratracheal node on image 18/504. 5 mm prevascular lymph node on image 18/504. 6.5 mm subcarinal lymph node on image 26/504. The esophagus is grossly normal. Stable small scattered thyroid nodules. Lungs/Pleura: Stable emphysematous changes. No acute pulmonary findings. A few tiny scattered subpleural pulmonary nodules are stable and likely benign. Musculoskeletal: No chest wall masses are identified. Right-sided Port-A-Cath is stable. No supraclavicular or axillary adenopathy. No worrisome bone lesions to suggest metastatic disease. CT ABDOMEN PELVIS FINDINGS Hepatobiliary: No hepatic lesions are identified. The small hepatic metastatic lesions have resolved. No new lesions. The portal and hepatic veins are patent. The gallbladder is surgically absent. No common bile duct dilatation. Pancreas: Ill-defined lesion in the lower pancreatic head/uncinate process measures approximately 3 x 3  cm  and appears relatively stable. The pancreatic body and tail appear normal. Spleen: Normal size.  No focal lesions. Adrenals/Urinary Tract: The adrenal glands and kidneys are unremarkable. Stable renal cysts. The bladder is unremarkable. Stomach/Bowel: The stomach, duodenum, small bowel and colon are grossly normal. No acute inflammatory changes, mass lesions or obstructive findings. The terminal ileum and appendix are normal. Vascular/Lymphatic: Stable advanced atherosclerotic calcifications involving the aorta, iliac arteries and branch vessels. No aneurysm or dissection. Marked interval reduction in size of the hypermetabolic upper abdominal lymph nodes. 7 mm node between the portal vein and IVC on image 56/504 previously measured 14.5 mm. 5.5 mm interaortocaval lymph node on image 65/504 previously measured 10.5 cm. Reproductive: Enlarged prostate gland with median lobe hypertrophy impressing on the base of the bladder. The seminal vesicles appear normal. Other: Small amount of free pelvic fluid is noted. No inguinal mass or adenopathy. 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 Musculoskeletal: No significant bony findings. Stable lumbar fusion hardware. IMPRESSION: 1. CT findings suggest a good response to treatment. No discernible residual hepatic metastatic lesions are identified. 2. Significant interval reduction in size of the abdominal lymph nodes. The pancreatic lesions difficult to measure exactly but appears relatively stable in size. 3. Stable small scattered mediastinal and hilar lymph nodes. 4. Stable small scattered subpleural pulmonary nodules, likely benign. 5. 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 Electronically Signed   By: Marijo Sanes M.D.   On: 09/10/2019 10:01       ASSESSMENT & PLAN:  1. Pancreatic cancer metastasized to liver ([place])   2. Encounter for antineoplastic chemotherapy   3. Anemia due to antineoplastic chemotherapy   4. Neuropathy    #Metastatic pancreatic cancer- liver mets, hilar nodal metastasis, ?bone mets, Interval CT chest abdomen pelvis was independently viewed by me and discussed with patient. CT findings suggest a good response.  Liver metastasis have completely resolved.  Significant interval reduction in size of abdominal lymph nodes.  Pancreatic lesions appears relatively stable.  Small stable mediastinal and hilar nodes.  Small scattered subpleural pulmonary nodules.  Stable pleural effusion. CA 19-9 trended down with a nadir of 118.  Today trended up to 209. Labs reviewed and discussed with patient.  Continue gemcitabine.  Increase dose to 1000 mg/m. Hold Abraxane for now given worsening of neuropathy.  #Fatigue, stable. #Anemia, due to chemotherapy.  Continue to monitor. 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  Continue gabapentin 603 times daily.  All questions were answered. The patient knows to call the clinic with any problems questions or concerns.   Return of visit: 1 week for gemcitabine  Earlie Server, MD, PhD Hematology Oncology [place] at [facility] 09/15/2019

## 2019-09-17 ENCOUNTER — Telehealth: Payer: Self-pay

## 2019-09-17 NOTE — Telephone Encounter (Signed)
Nutrition Assessment   Reason for Assessment:  Patient identified on Malnutrition Screening report for weight loss    ASSESSMENT:  73 year old male with metastatic pancreatic cancer to liver.  Past medical history of CABG, PVD, HTN, tobacco and etoh abuse, Fe deficiency anemia.  Patient followed by Dr. Tasia Catchings.  Receiving gemcitibine dose reduced.    Spoke with patient via phone to introduce self and service at the cancer center.  Patient reports that his appetite is better after having dose reduced on chemo. Reports taste buds are coming back.  Reports that this am ate eggs, bacon and 2 muffins.  Has not eaten lunch. Supper is usually whatever his wife prepares, meat and vegetables or spaghetti.  Drinks 1 ensure every am.      Nutrition Focused Physical Exam: deferred   Medications: fe sulfate, MVI, omega 3 fatty acid, prilosec   Labs: glucose 173   Anthropometrics:   Height: 69 inches Weight: 179 lb 8 oz on 12/4 UBW: 200-220s.  Last weight 204 lb in May 2020 BMI: 26  12% weight loss in the last 6 1/2 months   Estimated Energy Needs  Kcals: 2025-2430 Protein: 101-121 g Fluid: > 2 L   NUTRITION DIAGNOSIS: Inadequate oral intake related to cancer related treatment side effects as evidenced by 12% weight loss in the last 6 1/2 months    INTERVENTION:  Discussed with patient good sources of protein. Food examples provided. Encouraged high calorie foods as well.  Encouraged patient to continue to drink ensure daily, may need to increase to 2 per day if weight continues to drop.  Patient reports that he has contact information in phone.    MONITORING, EVALUATION, GOAL: Patient will consume adequate calories and protein to prevent weight from decreasing.     Next Visit: phone f/u Monday, Jan 11  Shandell Jallow B. Zenia Resides, [place], [place] Registered Dietitian [phone] (pager)

## 2019-09-21 ENCOUNTER — Other Ambulatory Visit: Payer: Self-pay

## 2019-09-21 ENCOUNTER — Encounter: Payer: Self-pay | Admitting: Oncology

## 2019-09-21 ENCOUNTER — Inpatient Hospital Stay (HOSPITAL_BASED_OUTPATIENT_CLINIC_OR_DEPARTMENT_OTHER): Payer: Medicare Other | Admitting: Oncology

## 2019-09-21 ENCOUNTER — Inpatient Hospital Stay (HOSPITAL_BASED_OUTPATIENT_CLINIC_OR_DEPARTMENT_OTHER): Payer: Medicare Other | Admitting: Hospice and Palliative Medicine

## 2019-09-21 ENCOUNTER — Inpatient Hospital Stay: Payer: Medicare Other

## 2019-09-21 VITALS — BP 129/73 | HR 78 | Temp 96.1°F | Resp 18 | Wt 177.7 lb

## 2019-09-21 DIAGNOSIS — C787 Secondary malignant neoplasm of liver and intrahepatic bile duct: Secondary | ICD-10-CM

## 2019-09-21 DIAGNOSIS — C259 Malignant neoplasm of pancreas, unspecified: Secondary | ICD-10-CM

## 2019-09-21 DIAGNOSIS — T451X5A Adverse effect of antineoplastic and immunosuppressive drugs, initial encounter: Secondary | ICD-10-CM

## 2019-09-21 DIAGNOSIS — Z5111 Encounter for antineoplastic chemotherapy: Secondary | ICD-10-CM

## 2019-09-21 DIAGNOSIS — Z515 Encounter for palliative care: Secondary | ICD-10-CM

## 2019-09-21 DIAGNOSIS — G629 Polyneuropathy, unspecified: Secondary | ICD-10-CM | POA: Diagnosis not present

## 2019-09-21 DIAGNOSIS — R5383 Other fatigue: Secondary | ICD-10-CM

## 2019-09-21 DIAGNOSIS — D6481 Anemia due to antineoplastic chemotherapy: Secondary | ICD-10-CM | POA: Diagnosis not present

## 2019-09-21 LAB — COMPREHENSIVE METABOLIC PANEL
ALT: 26 U/L (ref 0–44)
AST: 23 U/L (ref 15–41)
Albumin: 3.2 g/dL — ABNORMAL LOW (ref 3.5–5.0)
Alkaline Phosphatase: 51 U/L (ref 38–126)
Anion gap: 8 (ref 5–15)
BUN: 16 mg/dL (ref 8–23)
CO2: 23 mmol/L (ref 22–32)
Calcium: 9.2 mg/dL (ref 8.9–10.3)
Chloride: 107 mmol/L (ref 98–111)
Creatinine, Ser: 1 mg/dL (ref 0.61–1.24)
GFR calc Af Amer: >60 mL/min (ref >60)
GFR calc non Af Amer: >60 mL/min (ref >60)
Glucose, Bld: 108 mg/dL — ABNORMAL HIGH (ref 70–99)
Potassium: 3.7 mmol/L (ref 3.5–5.1)
Sodium: 138 mmol/L (ref 135–145)
Total Bilirubin: 0.4 mg/dL (ref 0.3–1.2)
Total Protein: 7.4 g/dL (ref 6.5–8.1)

## 2019-09-21 LAB — CBC WITH DIFFERENTIAL/PLATELET
Abs Immature Granulocytes: 0.07 10*3/uL (ref 0.00–0.07)
Basophils Absolute: 0 10*3/uL (ref 0.0–0.1)
Basophils Relative: 1 %
Eosinophils Absolute: 0 10*3/uL (ref 0.0–0.5)
Eosinophils Relative: 1 %
HCT: 29.8 % — ABNORMAL LOW (ref 39.0–52.0)
Hemoglobin: 8.7 g/dL — ABNORMAL LOW (ref 13.0–17.0)
Immature Granulocytes: 2 %
Lymphocytes Relative: 25 %
Lymphs Abs: 0.9 10*3/uL (ref 0.7–4.0)
MCH: 27.8 pg (ref 26.0–34.0)
MCHC: 29.2 g/dL — ABNORMAL LOW (ref 30.0–36.0)
MCV: 95.2 fL (ref 80.0–100.0)
Monocytes Absolute: 0.6 10*3/uL (ref 0.1–1.0)
Monocytes Relative: 18 %
Neutro Abs: 1.9 10*3/uL (ref 1.7–7.7)
Neutrophils Relative %: 53 %
Platelets: 282 10*3/uL (ref 150–400)
RBC: 3.13 MIL/uL — ABNORMAL LOW (ref 4.22–5.81)
RDW: 16.9 % — ABNORMAL HIGH (ref 11.5–15.5)
WBC: 3.5 10*3/uL — ABNORMAL LOW (ref 4.0–10.5)
nRBC: 0 % (ref 0.0–0.2)

## 2019-09-21 MED ORDER — PROCHLORPERAZINE MALEATE 10 MG PO TABS
10.0000 mg | ORAL_TABLET | Freq: Once | ORAL | Status: AC
  Administered 2019-09-21: 10 mg via ORAL
  Filled 2019-09-21: qty 1

## 2019-09-21 MED ORDER — SODIUM CHLORIDE 0.9 % IV SOLN
Freq: Once | INTRAVENOUS | Status: AC
  Administered 2019-09-21: 10:00:00 via INTRAVENOUS
  Filled 2019-09-21: qty 250

## 2019-09-21 MED ORDER — SODIUM CHLORIDE 0.9% FLUSH
10.0000 mL | INTRAVENOUS | Status: DC | PRN
  Administered 2019-09-21: 10 mL via INTRAVENOUS
  Filled 2019-09-21: qty 10

## 2019-09-21 MED ORDER — SODIUM CHLORIDE 0.9 % IV SOLN
2000.0000 mg | Freq: Once | INTRAVENOUS | Status: AC
  Administered 2019-09-21: 2000 mg via INTRAVENOUS
  Filled 2019-09-21: qty 52.6

## 2019-09-21 MED ORDER — HEPARIN SOD (PORK) LOCK FLUSH 100 UNIT/ML IV SOLN
500.0000 [IU] | Freq: Once | INTRAVENOUS | Status: AC
  Administered 2019-09-21: 500 [IU] via INTRAVENOUS
  Filled 2019-09-21: qty 5

## 2019-09-21 NOTE — Progress Notes (Signed)
[place]  [phone] [phone]   Name: Craig [name] Date: 09/21/2019 MRN: [id number]  DOB: [birth year]  Patient Care Team: Paulene Floor as PCP - General (Physician Assistant) Clent Jacks, RN as Oncology Nurse Navigator    REASON FOR CONSULTATION: Palliative Care consult requested for this 73 y.o. 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  Patient is being initiated on chemotherapy with gemcitabine and Abraxane.  He was referred to palliative care to help address goals and manage ongoing symptoms.   SOCIAL HISTORY:     reports that he quit smoking about 1 years ago. His smoking use included cigarettes. He has a 37.50 pack-year smoking history. He has quit using smokeless tobacco.  His smokeless tobacco use included chew. He reports previous alcohol use. He reports previous drug use. Drug: Marijuana.   Patient is married and lives at home with his wife.  He has a son and a daughter but reports that he is estranged from both.  He had another daughter who is now deceased.  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  Note that patient is illiterate.  He also says that his wife often will not answer the phone and recommends that we call and leave a message on the answering machine and then call her right back.  ADVANCE DIRECTIVES:  Does not have  CODE STATUS: DNR/DNI (MOST form completed on 08/31/2019)  PAST MEDICAL HISTORY: Past Medical History:  Diagnosis Date  . Allergic rhinitis   . Cancer [facility])    new dx Pancreatic Cancer - Aug 2020  . CHF (congestive heart failure) ([place])   . Chronic cholecystitis   . Coronary artery disease   . DDD (degenerative disc disease), cervical   . Dehydration 06/21/2019  .  Dyspnea   . Early cataract   . Erectile dysfunction   . GERD (gastroesophageal reflux disease)   . Gouty arthritis   . Headache    occasional migraines  . Hypercalcemia   . Hyperlipemia   . Hypertension   . Palpitations   . Peripheral vascular disease ([place])   . 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   LIMA to LAD SVG to [place] SVG SEQUENTIALLY to OM1 and OM2 SVG to ACUTE MARGINAL  . Spinal stenosis of cervical region   . Vitamin D deficiency     PAST SURGICAL HISTORY:  Past Surgical History:  Procedure Laterality Date  . BACK SURGERY  2018    fusion with screws  . CHOLECYSTECTOMY N/A 11/13/2018   Procedure: LAPAROSCOPIC CHOLECYSTECTOMY;  Surgeon: Vickie Epley, MD;  Location: [facility] ORS;  Service: General;  Laterality: N/A;  . COLONOSCOPY    . CORONARY ARTERY BYPASS GRAFT N/A 09/30/2017   Procedure: CORONARY ARTERY BYPASS GRAFTING times five using right and left Saphaneous vein harvested endoscopicly  and left internal mammary artery. (CABG),TEE;  Surgeon: Rexene Alberts, MD;  Location: [place];  Service: Open Heart Surgery;  Laterality: N/A;  . LEFT HEART CATH AND CORONARY ANGIOGRAPHY Left 09/06/2017   Procedure: LEFT HEART CATH AND CORONARY ANGIOGRAPHY;  Surgeon: Corey Skains, MD;  Location: [place] [facility];  Service: Cardiovascular;  Laterality: Left;  . percutaneous transluminal balloon angioplasty  01/2010   of left lower extremity  . PORTA CATH  INSERTION N/A 06/06/2019   Procedure: PORTA CATH INSERTION;  Surgeon: Katha Cabal, MD;  Location: [place] [facility];  Service: Cardiovascular;  Laterality: N/A;  . TEE WITHOUT CARDIOVERSION N/A 09/30/2017   Procedure: TRANSESOPHAGEAL ECHOCARDIOGRAM (TEE);  Surgeon: Rexene Alberts, MD;  Location: [place];  Service: Open Heart Surgery;  Laterality: N/A;  . VASCULAR SURGERY Left 2010   left exernal iliac and superficial femoral artery PTCA and stenting    HEMATOLOGY/ONCOLOGY HISTORY:  Oncology History  Pancreatic  cancer metastasized to liver ([place])  06/01/2019 Initial Diagnosis   Pancreatic cancer metastasized to liver ([place])   06/07/2019 -  Chemotherapy   The patient had PACLitaxel-protein bound (ABRAXANE) chemo infusion 250 mg, 125 mg/m2 = 250 mg (100 % of original dose 125 mg/m2), Intravenous,  Once, 4 of 4 cycles Dose modification: 100 mg/m2 (original dose 125 mg/m2, Cycle 1, Reason: Provider Judgment), 125 mg/m2 (original dose 125 mg/m2, Cycle 1, Reason: Provider Judgment), 100 mg/m2 (original dose 125 mg/m2, Cycle 2, Reason: Dose not tolerated, Comment: neuropathy), 90 mg/m2 (original dose 125 mg/m2, Cycle 3, Reason: Dose not tolerated, Comment: neuropathy), 100 mg/m2 (original dose 125 mg/m2, Cycle 3, Reason: Dose not tolerated) Administration: 250 mg (06/07/2019), 250 mg (06/14/2019), 250 mg (06/21/2019), 200 mg (07/05/2019), 200 mg (07/12/2019), 200 mg (07/26/2019), 200 mg (08/02/2019), 200 mg (08/16/2019), 200 mg (08/31/2019) gemcitabine (GEMZAR) 2,000 mg in sodium chloride 0.9 % 250 mL chemo infusion, 2,090 mg, Intravenous,  Once, 5 of 7 cycles Dose modification: 800 mg/m2 (original dose 1,000 mg/m2, Cycle 5, Reason: Dose not tolerated), 1,000 mg/m2 (original dose 1,000 mg/m2, Cycle 5, Reason: Provider Judgment) Administration: 2,000 mg (06/07/2019), 2,000 mg (09/14/2019), 2,000 mg (09/21/2019), 2,000 mg (06/14/2019), 2,000 mg (07/05/2019), 1,600 mg (07/12/2019), 1,600 mg (07/26/2019), 1,600 mg (08/02/2019), 1,600 mg (08/16/2019), 1,600 mg (08/31/2019)  for chemotherapy treatment.      ALLERGIES:  is allergic to lipitor [atorvastatin]; zetia [ezetimibe]; lisinopril; protonix [pantoprazole]; and spironolactone.  MEDICATIONS:  Current Outpatient Medications  Medication Sig Dispense Refill  . aspirin EC 81 MG tablet Take 81 mg by mouth daily.    . carvedilol (COREG) 6.25 MG tablet Take 6.25 mg by mouth 2 (two) times daily with a meal.  3  . diltiazem (CARDIZEM CD) 300 MG 24 hr capsule TAKE 1 CAPSULE BY MOUTH  EVERY DAY 90 capsule 1  . dorzolamide-timolol (COSOPT) 22.3-6.8 MG/ML ophthalmic solution INSTILL ONE DROP INTO BOTH EYES TWICE DAILY    . ferrous sulfate 325 (65 FE) MG tablet TAKE 1 TABLET BY MOUTH EVERY DAY WITH BREAKFAST 90 tablet 1  . gabapentin (NEURONTIN) 300 MG capsule TAKE 2 CAPSULES (600 MG TOTAL) BY MOUTH 3 (THREE) TIMES DAILY. 540 capsule 0  . latanoprost (XALATAN) 0.005 % ophthalmic solution Place 1 drop into both eyes at bedtime.   3  . lidocaine-prilocaine (EMLA) cream APPLY TO AFFECTED AREA ONCE AS DIRECTED    . Multiple Vitamin (MULTIVITAMIN WITH MINERALS) TABS tablet Take 1 tablet by mouth daily.    . OMEGA-3 FATTY ACIDS PO Take 1,200 mg by mouth daily.     [name] Kitchen omeprazole (PRILOSEC) 40 MG capsule TAKE 1 CAPSULE BY MOUTH EVERY DAY 90 capsule 0   No current facility-administered medications for this visit.   Facility-Administered Medications Ordered in Other Visits  Medication Dose Route Frequency Provider Last Rate Last Admin  . sodium chloride flush (NS) 0.9 % injection 10 mL  10 mL Intracatheter PRN Earlie Server, MD        VITAL SIGNS:  There were no vitals taken for this visit. There were no vitals filed for this visit.  Estimated body mass index is 26.24 kg/m as calculated from the following:   Height as of 06/12/19: 5\' 9"  (1.753 m).   Weight as of an earlier encounter on 09/21/19: 177 lb 11.2 oz (80.6 kg).  LABS: CBC:    Component Value Date/Time   WBC 3.5 (L) 09/21/2019 0818   HGB 8.7 (L) 09/21/2019 0818   HGB 11.2 (L) 05/15/2019 1111   HCT 29.8 (L) 09/21/2019 0818   HCT 32.7 (L) 05/15/2019 1111   PLT 282 09/21/2019 0818   PLT 277 05/15/2019 1111   MCV 95.2 09/21/2019 0818   MCV 80 05/15/2019 1111   NEUTROABS 1.9 09/21/2019 0818   NEUTROABS 3.5 05/15/2019 1111   LYMPHSABS 0.9 09/21/2019 0818   LYMPHSABS 1.2 05/15/2019 1111   MONOABS 0.6 09/21/2019 0818   EOSABS 0.0 09/21/2019 0818   EOSABS 0.2 05/15/2019 1111   BASOSABS 0.0 09/21/2019 0818   BASOSABS 0.1  05/15/2019 1111   Comprehensive Metabolic Panel:    Component Value Date/Time   NA 138 09/21/2019 0818   NA 139 05/15/2019 1111   K 3.7 09/21/2019 0818   CL 107 09/21/2019 0818   CO2 23 09/21/2019 0818   BUN 16 09/21/2019 0818   BUN 23 05/15/2019 1111   CREATININE 1.00 09/21/2019 0818   CREATININE 0.95 06/17/2017 1035   GLUCOSE 108 (H) 09/21/2019 0818   CALCIUM 9.2 09/21/2019 0818   AST 23 09/21/2019 0818   ALT 26 09/21/2019 0818   ALKPHOS 51 09/21/2019 0818   BILITOT 0.4 09/21/2019 0818   BILITOT <0.2 05/15/2019 1111   PROT 7.4 09/21/2019 0818   PROT 6.8 05/15/2019 1111   ALBUMIN 3.2 (L) 09/21/2019 0818   ALBUMIN 4.3 05/15/2019 1111    RADIOGRAPHIC STUDIES: CT Chest W Contrast  Result Date: 09/10/2019 CLINICAL DATA:  Restaging metastatic pancreatic cancer. EXAM: CT CHEST, ABDOMEN, AND PELVIS WITH CONTRAST TECHNIQUE: Multidetector CT imaging of the chest, abdomen and pelvis was performed following the standard protocol during bolus administration of intravenous contrast. CONTRAST:  165mL OMNIPAQUE IOHEXOL 300 MG/ML  SOLN COMPARISON:  PET-CT 06/04/2019 FINDINGS: CT CHEST FINDINGS Cardiovascular: The heart is normal in size. No pericardial effusion. Stable tortuosity and advanced atherosclerotic calcifications involving the thoracic aorta. 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 Stable advanced three-vessel coronary artery calcifications. Normal appearance of the pulmonary arteries. Mediastinum/Nodes: Stable small scattered mediastinal and hilar lymph nodes. 7.5 mm right paratracheal node on image 11/504. 6 mm right paratracheal node on image 18/504. 5 mm prevascular lymph node on image 18/504. 6.5 mm subcarinal lymph node on image 26/504. The esophagus is grossly normal. Stable small scattered thyroid nodules. Lungs/Pleura: Stable emphysematous changes. No acute pulmonary findings. A few tiny scattered subpleural pulmonary nodules are stable and likely benign.  Musculoskeletal: No chest wall masses are identified. Right-sided Port-A-Cath is stable. No supraclavicular or axillary adenopathy. No worrisome bone lesions to suggest metastatic disease. CT ABDOMEN PELVIS FINDINGS Hepatobiliary: No hepatic lesions are identified. The small hepatic metastatic lesions have resolved. No new lesions. The portal and hepatic veins are patent. The gallbladder is surgically absent. No common bile duct dilatation. Pancreas: Ill-defined lesion in the lower pancreatic head/uncinate process measures approximately 3 x 3 cm and appears relatively stable. The pancreatic body and tail appear normal. Spleen: Normal size.  No focal lesions. Adrenals/Urinary Tract: The adrenal glands and kidneys are unremarkable. Stable renal cysts. The bladder is unremarkable.  Stomach/Bowel: The stomach, duodenum, small bowel and colon are grossly normal. No acute inflammatory changes, mass lesions or obstructive findings. The terminal ileum and appendix are normal. Vascular/Lymphatic: Stable advanced atherosclerotic calcifications involving the aorta, iliac arteries and branch vessels. No aneurysm or dissection. Marked interval reduction in size of the hypermetabolic upper abdominal lymph nodes. 7 mm node between the portal vein and IVC on image 56/504 previously measured 14.5 mm. 5.5 mm interaortocaval lymph node on image 65/504 previously measured 10.5 cm. Reproductive: Enlarged prostate gland with median lobe hypertrophy impressing on the base of the bladder. The seminal vesicles appear normal. Other: Small amount of free pelvic fluid is noted. No inguinal mass or adenopathy. 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 Musculoskeletal: No significant bony findings. Stable lumbar fusion hardware. IMPRESSION: 1. CT findings suggest a good response to treatment. No discernible residual hepatic metastatic  lesions are identified. 2. Significant interval reduction in size of the abdominal lymph nodes. The pancreatic lesions difficult to measure exactly but appears relatively stable in size. 3. Stable small scattered mediastinal and hilar lymph nodes. 4. Stable small scattered subpleural pulmonary nodules, likely benign. 5. 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 Electronically Signed   By: Marijo Sanes M.D.   On: 09/10/2019 10:01   CT Abdomen Pelvis W Contrast  Result Date: 09/10/2019 CLINICAL DATA:  Restaging metastatic pancreatic cancer. EXAM: CT CHEST, ABDOMEN, AND PELVIS WITH CONTRAST TECHNIQUE: Multidetector CT imaging of the chest, abdomen and pelvis was performed following the standard protocol during bolus administration of intravenous contrast. CONTRAST:  117mL OMNIPAQUE IOHEXOL 300 MG/ML  SOLN COMPARISON:  PET-CT 06/04/2019 FINDINGS: CT CHEST FINDINGS Cardiovascular: The heart is normal in size. No pericardial effusion. Stable tortuosity and advanced atherosclerotic calcifications involving the thoracic aorta. 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 Stable advanced three-vessel coronary artery calcifications. Normal appearance of the pulmonary arteries. Mediastinum/Nodes: Stable small scattered mediastinal and hilar lymph nodes. 7.5 mm right paratracheal node on image 11/504. 6 mm right paratracheal node on image 18/504. 5 mm prevascular lymph node on image 18/504. 6.5 mm subcarinal lymph node on image 26/504. The esophagus is grossly normal. Stable small scattered thyroid nodules. Lungs/Pleura: Stable emphysematous changes. No acute pulmonary findings. A few tiny scattered subpleural pulmonary nodules are stable and likely benign. Musculoskeletal: No chest wall masses are identified. Right-sided Port-A-Cath is stable. No supraclavicular or axillary adenopathy. No worrisome bone  lesions to suggest metastatic disease. CT ABDOMEN PELVIS FINDINGS Hepatobiliary: No hepatic lesions are identified. The small hepatic metastatic lesions have resolved. No new lesions. The portal and hepatic veins are patent. The gallbladder is surgically absent. No common bile duct dilatation. Pancreas: Ill-defined lesion in the lower pancreatic head/uncinate process measures approximately 3 x 3 cm and appears relatively stable. The pancreatic body and tail appear normal. Spleen: Normal size.  No focal lesions. Adrenals/Urinary Tract: The adrenal glands and kidneys are unremarkable. Stable renal cysts. The bladder is unremarkable. Stomach/Bowel: The stomach, duodenum, small bowel and colon are grossly normal. No acute inflammatory changes, mass lesions or obstructive findings. The terminal ileum and appendix are normal. Vascular/Lymphatic: Stable advanced atherosclerotic calcifications involving the aorta, iliac arteries and branch vessels. No aneurysm or dissection. Marked interval reduction in size of the hypermetabolic upper abdominal lymph nodes. 7  mm node between the portal vein and IVC on image 56/504 previously measured 14.5 mm. 5.5 mm interaortocaval lymph node on image 65/504 previously measured 10.5 cm. Reproductive: Enlarged prostate gland with median lobe hypertrophy impressing on the base of the bladder. The seminal vesicles appear normal. Other: Small amount of free pelvic fluid is noted. No inguinal mass or adenopathy. 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 Musculoskeletal: No significant bony findings. Stable lumbar fusion hardware. IMPRESSION: 1. CT findings suggest a good response to treatment. No discernible residual hepatic metastatic lesions are identified. 2. Significant interval reduction in size of the abdominal lymph nodes. The pancreatic lesions difficult to measure exactly  but appears relatively stable in size. 3. Stable small scattered mediastinal and hilar lymph nodes. 4. Stable small scattered subpleural pulmonary nodules, likely benign. 5. 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 Electronically Signed   By: Marijo Sanes M.D.   On: 09/10/2019 10:01    PERFORMANCE STATUS (ECOG) : 0 - Asymptomatic  Review of Systems Unless otherwise noted, a complete review of systems is negative.  Physical Exam General: NAD, frail appearing, thin Pulmonary: unlabored Extremities: no edema, no joint deformities Skin: no rashes Neurological: Weakness but otherwise nonfocal  IMPRESSION: Routine follow-up visit made today.  Patient was seen in the infusion area while he was receiving treatment.  Patient reports he is doing reasonably well.  He denies any acute changes or concerns.  He denies any distressing symptoms today.  Patient would like to speak with social work regarding financial resources.  He describes the cost of transportation and medications as becoming burdensome.  We will schedule a visit with Elease Etienne.  PLAN: -Continue current scope of treatment -Referral to Barnabas Lister, [place] 3[place]4 [place]   Patient expressed understanding and was in agreement with this plan. He also understands that He can call the clinic at any time with any questions, concerns, or complaints.     Time Total: 15 minutes  Visit consisted of counseling and education dealing with the complex and emotionally intense issues of symptom management and palliative care in the setting of serious and potentially life-threatening illness.Greater than 50%  of this time was spent counseling and coordinating care related to the above assessment and plan.  Signed by: Altha Harm, PhD, NP-C [phone] (Work Cell)

## 2019-09-21 NOTE — Progress Notes (Signed)
Patient here for follow up. No concerns voiced today.  

## 2019-09-22 NOTE — Progress Notes (Signed)
Hematology/Oncology  Follow up note [facility] [phone] [phone]   Patient Care Team: Paulene Floor as PCP - General (Physician Assistant) Clent Jacks, RN as Oncology Nurse Navigator  REFERRING PROVIDER: Trinna Post, PA-C  CHIEF COMPLAINTS/REASON FOR VISIT:  Follow up for treatment of pancreatic cancer  HISTORY OF PRESENTING ILLNESS:   Craig [name] is a  73 y.o.  male with PMH listed below, including CABG x5, PVD, hypertension, former tobacco abuse, former alcohol abuse, iron deficiency anemia, was seen in consultation at the request of  Terrilee Croak, Adriana M, PA-C  for evaluation of abnormal CT Patient recently presented to primary care provider complaining for abdominal pain radiating to his back for about 10 days.  Patient uses Tylenol as needed for pain. Work-up showed elevated amylase, lipase, mild anemia with hemoglobin of 11.3 Acute hepatitis panel negative. 05/16/2019 CT abdomen pelvis with contrast showed poorly marginated heterogeneous hypodense 2.9 cm pancreatic mass at the uncinate process, worrisome for primary pancreatic cancer.  No biliary or pancreatic duct dilation. Several ill defined hypodense liver masses scattered throughout the liver, largest 3.1 cm in the segment 6 right liver lobe. Nonspecific portacaval and aortocaval adenopathy. Extreme medial left lung base 4.4 cm pleural-based mass probably a pleural metastasis.  Additional tiny pulmonary nodules scattered at the right lung base are indeterminate. Mild prostate or megaly  Patient was referred to heme-onc for further evaluation. Patient reports that his abdominal pain was 10 out of 10 a few days ago, he uses Tylenol as needed. Today his pain is getting better, 2 out of 10.  Denies any nausea, vomiting, diarrhea, fever or chills. Married, lives with wife.  Appetite is good.  He weighs 195 pounds today at the clinic. His weight was 204 pounds  back in May 2020.  He had a history of 37.5-pack-year smoking history quitting 2018. No longer drinking alcohol.  # ultrasound-guided liver biopsy on 05/21/2019.  Pathology showed fragments of benign hepatic parenchyma showing minimal macro vascular steatosis, otherwise no significant histo pathologic change. Single core of renal tissue showing approximately 25% glomerulosclerosis  # #History of CAD, status post CABG x5.  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  # Omniseq test showed PD-L1 50% TPS, TMB high, negative for BRCA1/2 or NTRK fusion.  MSI could not be completed.   INTERVAL HISTORY Craig [name] is a 73 y.o. male who has above history reviewed by me today presents for follow up for evaluation prior to chemotherapy. Problems and complaints are listed below: Patient continues to feel tired and fatigued.  No dizziness. Denies any fever, chills, nausea or vomiting.   Continue to have neuropathy with numbness and tingling of toes and fingertips.   Patient reports taking gabapentin 600 mg 3 times a day.  Appetite is good. Patient is in the clinic by himself. Review of Systems  Constitutional: Positive for fatigue. Negative for appetite change, chills, fever and unexpected weight change.  HENT:   Negative for hearing loss and voice change.   Eyes: Negative for eye problems and icterus.  Respiratory: Negative for chest tightness, cough and shortness of breath.   Cardiovascular: Negative for chest pain and leg swelling.  Gastrointestinal: Negative for abdominal distention and abdominal pain.  Endocrine: Negative for hot flashes.  Genitourinary: Negative for difficulty urinating, dysuria and frequency.   Musculoskeletal: Negative for arthralgias.  Skin: Negative for itching  and rash.  Neurological: Positive for numbness. Negative for light-headedness.    Hematological: Negative for adenopathy. Does not bruise/bleed easily.  Psychiatric/Behavioral: Negative for confusion.    MEDICAL HISTORY:  Past Medical History:  Diagnosis Date  . Allergic rhinitis   . Cancer [facility])    new dx Pancreatic Cancer - Aug 2020  . CHF (congestive heart failure) ([place])   . Chronic cholecystitis   . Coronary artery disease   . DDD (degenerative disc disease), cervical   . Dehydration 06/21/2019  . Dyspnea   . Early cataract   . Erectile dysfunction   . GERD (gastroesophageal reflux disease)   . Gouty arthritis   . Headache    occasional migraines  . Hypercalcemia   . Hyperlipemia   . Hypertension   . Palpitations   . Peripheral vascular disease ([place])   . 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   LIMA to LAD SVG to [place] SVG SEQUENTIALLY to OM1 and OM2 SVG to ACUTE MARGINAL  . Spinal stenosis of cervical region   . Vitamin D deficiency     SURGICAL HISTORY: Past Surgical History:  Procedure Laterality Date  . BACK SURGERY  2018    fusion with screws  . CHOLECYSTECTOMY N/A 11/13/2018   Procedure: LAPAROSCOPIC CHOLECYSTECTOMY;  Surgeon: Vickie Epley, MD;  Location: [facility];  Service: General;  Laterality: N/A;  . COLONOSCOPY    . CORONARY ARTERY BYPASS GRAFT N/A 09/30/2017   Procedure: CORONARY ARTERY BYPASS GRAFTING times five using right and left Saphaneous vein harvested endoscopicly  and left internal mammary artery. (CABG),TEE;  Surgeon: Rexene Alberts, MD;  Location: [place];  Service: Open Heart Surgery;  Laterality: N/A;  . LEFT HEART CATH AND CORONARY ANGIOGRAPHY Left 09/06/2017   Procedure: LEFT HEART CATH AND CORONARY ANGIOGRAPHY;  Surgeon: Corey Skains, MD;  Location: [place] [facility];  Service: Cardiovascular;  Laterality: Left;  . percutaneous transluminal balloon angioplasty  01/2010   of left lower extremity  . PORTA CATH INSERTION N/A 06/06/2019   Procedure: PORTA CATH INSERTION;  Surgeon: Katha Cabal, MD;   Location: [place] [facility];  Service: Cardiovascular;  Laterality: N/A;  . TEE WITHOUT CARDIOVERSION N/A 09/30/2017   Procedure: TRANSESOPHAGEAL ECHOCARDIOGRAM (TEE);  Surgeon: Rexene Alberts, MD;  Location: [place];  Service: Open Heart Surgery;  Laterality: N/A;  . VASCULAR SURGERY Left 2010   left exernal iliac and superficial femoral artery PTCA and stenting    SOCIAL HISTORY: Social History   Socioeconomic History  . Marital status: Married    Spouse name: Enid Derry  . Number of children: 2  . Years of education: Not on file  . Highest education level: Not on file  Occupational History  . Occupation: drove tractors    Comment: retired  Tobacco Use  . Smoking status: Former Smoker    Packs/day: 0.75    Years: 50.00    Pack years: 37.50    Types: Cigarettes    Quit date: 09/27/2017    Years since quitting: 1.9  . Smokeless tobacco: Former Systems developer    Types: Chew  Substance and Sexual Activity  . Alcohol use: Not Currently    Comment: stopped drinking before cabg  . Drug use: Not Currently    Types: Marijuana    Comment: stopped smoking before CABG  . Sexual activity: Not Currently  Other Topics Concern  . Not on file  Social History Narrative  . Not on file   Social Determinants of  Health   Financial Resource Strain: Low Risk   . Difficulty of Paying Living Expenses: Not hard at all  Food Insecurity: No Food Insecurity  . Worried About Charity fundraiser in the Last Year: Never true  . Ran Out of Food in the Last Year: Never true  Transportation Needs: No Transportation Needs  . Lack of Transportation (Medical): No  . Lack of Transportation (Non-Medical): No  Physical Activity: Sufficiently Active  . Days of Exercise per Week: 5 days  . Minutes of Exercise per Session: 150+ min  Stress: No Stress Concern Present  . Feeling of Stress : Not at all  Social Connections: Somewhat Isolated  . Frequency of Communication with Friends and Family: More than three  times a week  . Frequency of Social Gatherings with Friends and Family: More than three times a week  . Attends Religious Services: Never  . Active Member of Clubs or Organizations: No  . Attends Archivist Meetings: Never  . Marital Status: Married  Human resources officer Violence:   . Fear of Current or Ex-Partner: Not on file  . Emotionally Abused: Not on file  . Physically Abused: Not on file  . Sexually Abused: Not on file    FAMILY HISTORY: Family History  Problem Relation Age of Onset  . Brain cancer Mother   . Emphysema Father   . Cancer Brother     ALLERGIES:  is allergic to lipitor [atorvastatin]; zetia [ezetimibe]; lisinopril; protonix [pantoprazole]; and spironolactone.  MEDICATIONS:  Current Outpatient Medications  Medication Sig Dispense Refill  . aspirin EC 81 MG tablet Take 81 mg by mouth daily.    . carvedilol (COREG) 6.25 MG tablet Take 6.25 mg by mouth 2 (two) times daily with a meal.  3  . diltiazem (CARDIZEM CD) 300 MG 24 hr capsule TAKE 1 CAPSULE BY MOUTH EVERY DAY 90 capsule 1  . dorzolamide-timolol (COSOPT) 22.3-6.8 MG/ML ophthalmic solution INSTILL ONE DROP INTO BOTH EYES TWICE DAILY    . ferrous sulfate 325 (65 FE) MG tablet TAKE 1 TABLET BY MOUTH EVERY DAY WITH BREAKFAST 90 tablet 1  . gabapentin (NEURONTIN) 300 MG capsule TAKE 2 CAPSULES (600 MG TOTAL) BY MOUTH 3 (THREE) TIMES DAILY. 540 capsule 0  . latanoprost (XALATAN) 0.005 % ophthalmic solution Place 1 drop into both eyes at bedtime.   3  . lidocaine-prilocaine (EMLA) cream APPLY TO AFFECTED AREA ONCE AS DIRECTED    . Multiple Vitamin (MULTIVITAMIN WITH MINERALS) TABS tablet Take 1 tablet by mouth daily.    . OMEGA-3 FATTY ACIDS PO Take 1,200 mg by mouth daily.     [name] Kitchen omeprazole (PRILOSEC) 40 MG capsule TAKE 1 CAPSULE BY MOUTH EVERY DAY 90 capsule 0   No current facility-administered medications for this visit.   Facility-Administered Medications Ordered in Other Visits  Medication Dose  Route Frequency Provider Last Rate Last Admin  . sodium chloride flush (NS) 0.9 % injection 10 mL  10 mL Intracatheter PRN Earlie Server, MD         PHYSICAL EXAMINATION: ECOG PERFORMANCE STATUS: 1 - Symptomatic but completely ambulatory Vitals:   09/21/19 0843  BP: 129/73  Pulse: 78  Resp: 18  Temp: (!) 96.1 F (35.6 C)   Filed Weights   09/21/19 0843  Weight: 177 lb 11.2 oz (80.6 kg)    Physical Exam Constitutional:      General: He is not in acute distress.    Comments: Walk in   HENT:  Head: Normocephalic and atraumatic.  Eyes:     General: No scleral icterus.    Pupils: Pupils are equal, round, and reactive to light.  Cardiovascular:     Rate and Rhythm: Normal rate and regular rhythm.     Heart sounds: Normal heart sounds.  Pulmonary:     Effort: Pulmonary effort is normal. No respiratory distress.     Breath sounds: No wheezing.  Abdominal:     General: Bowel sounds are normal. There is no distension.     Palpations: Abdomen is soft. There is no mass.     Tenderness: There is no abdominal tenderness.  Musculoskeletal:        General: No deformity. Normal range of motion.     Cervical back: Normal range of motion and neck supple.  Skin:    General: Skin is warm and dry.     Findings: No erythema or rash.     Comments: Mediport+  Neurological:     General: No focal deficit present.     Mental Status: He is alert and oriented to person, place, and time.     Cranial Nerves: No cranial nerve deficit.     Coordination: Coordination normal.  Psychiatric:        Behavior: Behavior normal.        Thought Content: Thought content normal.     LABORATORY DATA:  I have reviewed the data as listed Lab Results  Component Value Date   WBC 3.5 (L) 09/21/2019   HGB 8.7 (L) 09/21/2019   HCT 29.8 (L) 09/21/2019   MCV 95.2 09/21/2019   PLT 282 09/21/2019   Recent Labs    08/31/19 0817 09/14/19 0843 09/21/19 0818  NA 136 137 138  K 4.1 4.2 3.7  CL 106 106 107   CO2 '23 22 23  '$ GLUCOSE 106* 173* 108*  BUN '14 17 16  '$ CREATININE 1.10 0.88 1.00  CALCIUM 9.1 9.0 9.2  GFRNONAA >60 >60 >60  GFRAA >60 >60 >60  PROT 7.1 7.2 7.4  ALBUMIN 3.3* 3.2* 3.2*  AST '28 20 23  '$ ALT 33 24 26  ALKPHOS 58 62 51  BILITOT 0.6 0.5 0.4   Iron/TIBC/Ferritin/ %Sat    Component Value Date/Time   IRON 119 04/27/2019 0940   TIBC 363 04/27/2019 0940   FERRITIN 58 04/27/2019 0940   IRONPCTSAT 33 04/27/2019 0940      RADIOGRAPHIC STUDIES: I have personally reviewed the radiological images as listed and agreed with the findings in the report. CT Chest W Contrast  Result Date: 09/10/2019 CLINICAL DATA:  Restaging metastatic pancreatic cancer. EXAM: CT CHEST, ABDOMEN, AND PELVIS WITH CONTRAST TECHNIQUE: Multidetector CT imaging of the chest, abdomen and pelvis was performed following the standard protocol during bolus administration of intravenous contrast. CONTRAST:  136m OMNIPAQUE IOHEXOL 300 MG/ML  SOLN COMPARISON:  PET-CT 06/04/2019 FINDINGS: CT CHEST FINDINGS Cardiovascular: The heart is normal in size. No pericardial effusion. Stable tortuosity and advanced atherosclerotic calcifications involving the thoracic aorta. 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 Stable advanced three-vessel coronary artery calcifications. Normal appearance of the pulmonary arteries. Mediastinum/Nodes: Stable small scattered mediastinal and hilar lymph nodes. 7.5 mm right paratracheal node on image 11/504. 6 mm right paratracheal node on image 18/504. 5 mm prevascular lymph node on image 18/504. 6.5 mm subcarinal lymph node on image 26/504. The esophagus is grossly normal. Stable small scattered thyroid nodules. Lungs/Pleura: Stable emphysematous changes. No acute pulmonary findings. A few tiny scattered subpleural pulmonary nodules are  stable and likely benign. Musculoskeletal: No chest wall masses are identified. Right-sided Port-A-Cath is stable. No supraclavicular or axillary  adenopathy. No worrisome bone lesions to suggest metastatic disease. CT ABDOMEN PELVIS FINDINGS Hepatobiliary: No hepatic lesions are identified. The small hepatic metastatic lesions have resolved. No new lesions. The portal and hepatic veins are patent. The gallbladder is surgically absent. No common bile duct dilatation. Pancreas: Ill-defined lesion in the lower pancreatic head/uncinate process measures approximately 3 x 3 cm and appears relatively stable. The pancreatic body and tail appear normal. Spleen: Normal size.  No focal lesions. Adrenals/Urinary Tract: The adrenal glands and kidneys are unremarkable. Stable renal cysts. The bladder is unremarkable. Stomach/Bowel: The stomach, duodenum, small bowel and colon are grossly normal. No acute inflammatory changes, mass lesions or obstructive findings. The terminal ileum and appendix are normal. Vascular/Lymphatic: Stable advanced atherosclerotic calcifications involving the aorta, iliac arteries and branch vessels. No aneurysm or dissection. Marked interval reduction in size of the hypermetabolic upper abdominal lymph nodes. 7 mm node between the portal vein and IVC on image 56/504 previously measured 14.5 mm. 5.5 mm interaortocaval lymph node on image 65/504 previously measured 10.5 cm. Reproductive: Enlarged prostate gland with median lobe hypertrophy impressing on the base of the bladder. The seminal vesicles appear normal. Other: Small amount of free pelvic fluid is noted. No inguinal mass or adenopathy. 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 Musculoskeletal: No significant bony findings. Stable lumbar fusion hardware. IMPRESSION: 1. CT findings suggest a good response to treatment. No discernible residual hepatic metastatic lesions are identified. 2. Significant interval reduction in size of the abdominal lymph nodes. The pancreatic lesions  difficult to measure exactly but appears relatively stable in size. 3. Stable small scattered mediastinal and hilar lymph nodes. 4. Stable small scattered subpleural pulmonary nodules, likely benign. 5. 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 Electronically Signed   By: Marijo Sanes M.D.   On: 09/10/2019 10:01   CT Abdomen Pelvis W Contrast  Result Date: 09/10/2019 CLINICAL DATA:  Restaging metastatic pancreatic cancer. EXAM: CT CHEST, ABDOMEN, AND PELVIS WITH CONTRAST TECHNIQUE: Multidetector CT imaging of the chest, abdomen and pelvis was performed following the standard protocol during bolus administration of intravenous contrast. CONTRAST:  199m OMNIPAQUE IOHEXOL 300 MG/ML  SOLN COMPARISON:  PET-CT 06/04/2019 FINDINGS: CT CHEST FINDINGS Cardiovascular: The heart is normal in size. No pericardial effusion. Stable tortuosity and advanced atherosclerotic calcifications involving the thoracic aorta. 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 Stable advanced three-vessel coronary artery calcifications. Normal appearance of the pulmonary arteries. Mediastinum/Nodes: Stable small scattered mediastinal and hilar lymph nodes. 7.5 mm right paratracheal node on image 11/504. 6 mm right paratracheal node on image 18/504. 5 mm prevascular lymph node on image 18/504. 6.5 mm subcarinal lymph node on image 26/504. The esophagus is grossly normal. Stable small scattered thyroid nodules. Lungs/Pleura: Stable emphysematous changes. No acute pulmonary findings. A few tiny scattered subpleural pulmonary nodules are stable and likely benign. Musculoskeletal: No chest wall masses are identified. Right-sided Port-A-Cath is stable. No supraclavicular or axillary adenopathy. No worrisome bone lesions to suggest metastatic disease. CT ABDOMEN PELVIS FINDINGS Hepatobiliary: No hepatic lesions are identified.  The small hepatic metastatic lesions have resolved. No new lesions. The portal and hepatic veins are patent. The  gallbladder is surgically absent. No common bile duct dilatation. Pancreas: Ill-defined lesion in the lower pancreatic head/uncinate process measures approximately 3 x 3 cm and appears relatively stable. The pancreatic body and tail appear normal. Spleen: Normal size.  No focal lesions. Adrenals/Urinary Tract: The adrenal glands and kidneys are unremarkable. Stable renal cysts. The bladder is unremarkable. Stomach/Bowel: The stomach, duodenum, small bowel and colon are grossly normal. No acute inflammatory changes, mass lesions or obstructive findings. The terminal ileum and appendix are normal. Vascular/Lymphatic: Stable advanced atherosclerotic calcifications involving the aorta, iliac arteries and branch vessels. No aneurysm or dissection. Marked interval reduction in size of the hypermetabolic upper abdominal lymph nodes. 7 mm node between the portal vein and IVC on image 56/504 previously measured 14.5 mm. 5.5 mm interaortocaval lymph node on image 65/504 previously measured 10.5 cm. Reproductive: Enlarged prostate gland with median lobe hypertrophy impressing on the base of the bladder. The seminal vesicles appear normal. Other: Small amount of free pelvic fluid is noted. No inguinal mass or adenopathy. 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 Musculoskeletal: No significant bony findings. Stable lumbar fusion hardware. IMPRESSION: 1. CT findings suggest a good response to treatment. No discernible residual hepatic metastatic lesions are identified. 2. Significant interval reduction in size of the abdominal lymph nodes. The pancreatic lesions difficult to measure exactly but appears relatively stable in size. 3. Stable small scattered mediastinal and hilar lymph nodes. 4. Stable small  scattered subpleural pulmonary nodules, likely benign. 5. 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 Electronically Signed   By: Marijo Sanes M.D.   On: 09/10/2019 10:01      ASSESSMENT & PLAN:  1. Pancreatic cancer metastasized to liver ([place])   2. Encounter for antineoplastic chemotherapy   3. Neuropathy   4. Anemia due to antineoplastic chemotherapy   5. Fatigue, unspecified type    #Metastatic pancreatic cancer- liver mets, hilar nodal metastasis, ?bone mets, Labs reviewed and discussed with patient. Counts acceptable to proceed with gemcitabine.  Abraxane is currently on hold due to neuropathy.  #Anemia, secondary to chemotherapy.  Hemoglobin has worsened.  Repeat CBC in 1 week, hold tubes.  Possible blood transfusion if symptomatic will hemoglobin less than 7.  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  Continue gabapentin 600  3 times daily.  All questions were answered. The patient knows to call the clinic with any problems questions or concerns.   Return of visit: 1 week, lab +/-blood transfusion, 2 weeks, lab MD gemcitabine.  Earlie Server, MD, PhD Hematology Oncology [place] at [facility] 09/22/2019

## 2019-09-26 ENCOUNTER — Other Ambulatory Visit: Payer: Self-pay

## 2019-09-26 ENCOUNTER — Inpatient Hospital Stay: Payer: Medicare Other

## 2019-09-26 DIAGNOSIS — Z5111 Encounter for antineoplastic chemotherapy: Secondary | ICD-10-CM | POA: Diagnosis not present

## 2019-09-26 DIAGNOSIS — C259 Malignant neoplasm of pancreas, unspecified: Secondary | ICD-10-CM

## 2019-09-26 LAB — CBC WITH DIFFERENTIAL/PLATELET
Abs Immature Granulocytes: 0.02 10*3/uL (ref 0.00–0.07)
Basophils Absolute: 0 10*3/uL (ref 0.0–0.1)
Basophils Relative: 1 %
Eosinophils Absolute: 0.1 10*3/uL (ref 0.0–0.5)
Eosinophils Relative: 2 %
HCT: 28.5 % — ABNORMAL LOW (ref 39.0–52.0)
Hemoglobin: 8.5 g/dL — ABNORMAL LOW (ref 13.0–17.0)
Immature Granulocytes: 1 %
Lymphocytes Relative: 14 %
Lymphs Abs: 0.6 10*3/uL — ABNORMAL LOW (ref 0.7–4.0)
MCH: 28.1 pg (ref 26.0–34.0)
MCHC: 29.8 g/dL — ABNORMAL LOW (ref 30.0–36.0)
MCV: 94.1 fL (ref 80.0–100.0)
Monocytes Absolute: 0.1 10*3/uL (ref 0.1–1.0)
Monocytes Relative: 3 %
Neutro Abs: 3.3 10*3/uL (ref 1.7–7.7)
Neutrophils Relative %: 79 %
Platelets: 163 10*3/uL (ref 150–400)
RBC: 3.03 MIL/uL — ABNORMAL LOW (ref 4.22–5.81)
RDW: 17 % — ABNORMAL HIGH (ref 11.5–15.5)
WBC: 4.2 10*3/uL (ref 4.0–10.5)
nRBC: 0 % (ref 0.0–0.2)

## 2019-09-26 LAB — COMPREHENSIVE METABOLIC PANEL
ALT: 28 U/L (ref 0–44)
AST: 30 U/L (ref 15–41)
Albumin: 3.1 g/dL — ABNORMAL LOW (ref 3.5–5.0)
Alkaline Phosphatase: 49 U/L (ref 38–126)
Anion gap: 10 (ref 5–15)
BUN: 19 mg/dL (ref 8–23)
CO2: 22 mmol/L (ref 22–32)
Calcium: 9.4 mg/dL (ref 8.9–10.3)
Chloride: 106 mmol/L (ref 98–111)
Creatinine, Ser: 0.96 mg/dL (ref 0.61–1.24)
GFR calc Af Amer: >60 mL/min (ref >60)
GFR calc non Af Amer: >60 mL/min (ref >60)
Glucose, Bld: 172 mg/dL — ABNORMAL HIGH (ref 70–99)
Potassium: 4 mmol/L (ref 3.5–5.1)
Sodium: 138 mmol/L (ref 135–145)
Total Bilirubin: 0.5 mg/dL (ref 0.3–1.2)
Total Protein: 7 g/dL (ref 6.5–8.1)

## 2019-09-26 MED ORDER — HEPARIN SOD (PORK) LOCK FLUSH 100 UNIT/ML IV SOLN
INTRAVENOUS | Status: AC
  Filled 2019-09-26: qty 5

## 2019-09-26 MED ORDER — HEPARIN SOD (PORK) LOCK FLUSH 100 UNIT/ML IV SOLN
500.0000 [IU] | Freq: Once | INTRAVENOUS | Status: AC
  Administered 2019-09-26: 500 [IU] via INTRAVENOUS
  Filled 2019-09-26: qty 5

## 2019-10-02 ENCOUNTER — Other Ambulatory Visit: Payer: Self-pay

## 2019-10-02 NOTE — Progress Notes (Signed)
Patient pre screened for office appointment, no questions or concerns today. Patient reminded of upcoming appointment time and date. 

## 2019-10-02 NOTE — Progress Notes (Signed)
PSN spoke with patient via telephone. PSN gave patient information about applying for financial hardship with [employer].  Patient will also receive help for monthly expenses through the [employer].

## 2019-10-03 ENCOUNTER — Encounter: Payer: Self-pay | Admitting: Oncology

## 2019-10-03 ENCOUNTER — Inpatient Hospital Stay: Payer: Medicare Other

## 2019-10-03 ENCOUNTER — Other Ambulatory Visit: Payer: Self-pay

## 2019-10-03 ENCOUNTER — Inpatient Hospital Stay (HOSPITAL_BASED_OUTPATIENT_CLINIC_OR_DEPARTMENT_OTHER): Payer: Medicare Other | Admitting: Oncology

## 2019-10-03 VITALS — BP 159/88 | HR 79 | Temp 96.3°F | Resp 18 | Wt 179.8 lb

## 2019-10-03 DIAGNOSIS — C259 Malignant neoplasm of pancreas, unspecified: Secondary | ICD-10-CM

## 2019-10-03 DIAGNOSIS — D6481 Anemia due to antineoplastic chemotherapy: Secondary | ICD-10-CM | POA: Diagnosis not present

## 2019-10-03 DIAGNOSIS — Z7189 Other specified counseling: Secondary | ICD-10-CM

## 2019-10-03 DIAGNOSIS — C787 Secondary malignant neoplasm of liver and intrahepatic bile duct: Secondary | ICD-10-CM

## 2019-10-03 DIAGNOSIS — R5383 Other fatigue: Secondary | ICD-10-CM

## 2019-10-03 DIAGNOSIS — G629 Polyneuropathy, unspecified: Secondary | ICD-10-CM

## 2019-10-03 DIAGNOSIS — Z5111 Encounter for antineoplastic chemotherapy: Secondary | ICD-10-CM

## 2019-10-03 DIAGNOSIS — T451X5A Adverse effect of antineoplastic and immunosuppressive drugs, initial encounter: Secondary | ICD-10-CM

## 2019-10-03 LAB — COMPREHENSIVE METABOLIC PANEL
ALT: 18 U/L (ref 0–44)
AST: 19 U/L (ref 15–41)
Albumin: 3.4 g/dL — ABNORMAL LOW (ref 3.5–5.0)
Alkaline Phosphatase: 54 U/L (ref 38–126)
Anion gap: 9 (ref 5–15)
BUN: 15 mg/dL (ref 8–23)
CO2: 24 mmol/L (ref 22–32)
Calcium: 9.3 mg/dL (ref 8.9–10.3)
Chloride: 107 mmol/L (ref 98–111)
Creatinine, Ser: 0.97 mg/dL (ref 0.61–1.24)
GFR calc Af Amer: >60 mL/min (ref >60)
GFR calc non Af Amer: >60 mL/min (ref >60)
Glucose, Bld: 106 mg/dL — ABNORMAL HIGH (ref 70–99)
Potassium: 3.9 mmol/L (ref 3.5–5.1)
Sodium: 140 mmol/L (ref 135–145)
Total Bilirubin: 0.7 mg/dL (ref 0.3–1.2)
Total Protein: 6.9 g/dL (ref 6.5–8.1)

## 2019-10-03 LAB — CBC WITH DIFFERENTIAL/PLATELET
Abs Immature Granulocytes: 0.07 10*3/uL (ref 0.00–0.07)
Basophils Absolute: 0.1 10*3/uL (ref 0.0–0.1)
Basophils Relative: 1 %
Eosinophils Absolute: 0.1 10*3/uL (ref 0.0–0.5)
Eosinophils Relative: 2 %
HCT: 31.4 % — ABNORMAL LOW (ref 39.0–52.0)
Hemoglobin: 9.2 g/dL — ABNORMAL LOW (ref 13.0–17.0)
Immature Granulocytes: 1 %
Lymphocytes Relative: 16 %
Lymphs Abs: 0.9 10*3/uL (ref 0.7–4.0)
MCH: 28 pg (ref 26.0–34.0)
MCHC: 29.3 g/dL — ABNORMAL LOW (ref 30.0–36.0)
MCV: 95.4 fL (ref 80.0–100.0)
Monocytes Absolute: 0.8 10*3/uL (ref 0.1–1.0)
Monocytes Relative: 14 %
Neutro Abs: 3.8 10*3/uL (ref 1.7–7.7)
Neutrophils Relative %: 66 %
Platelets: 425 10*3/uL — ABNORMAL HIGH (ref 150–400)
RBC: 3.29 MIL/uL — ABNORMAL LOW (ref 4.22–5.81)
RDW: 18.2 % — ABNORMAL HIGH (ref 11.5–15.5)
WBC: 5.8 10*3/uL (ref 4.0–10.5)
nRBC: 0 % (ref 0.0–0.2)

## 2019-10-03 MED ORDER — PROCHLORPERAZINE MALEATE 10 MG PO TABS
10.0000 mg | ORAL_TABLET | Freq: Once | ORAL | Status: AC
  Administered 2019-10-03: 10 mg via ORAL
  Filled 2019-10-03: qty 1

## 2019-10-03 MED ORDER — HEPARIN SOD (PORK) LOCK FLUSH 100 UNIT/ML IV SOLN
INTRAVENOUS | Status: AC
  Filled 2019-10-03: qty 5

## 2019-10-03 MED ORDER — SODIUM CHLORIDE 0.9 % IV SOLN
Freq: Once | INTRAVENOUS | Status: AC
  Filled 2019-10-03: qty 250

## 2019-10-03 MED ORDER — HEPARIN SOD (PORK) LOCK FLUSH 100 UNIT/ML IV SOLN
500.0000 [IU] | Freq: Once | INTRAVENOUS | Status: AC
  Administered 2019-10-03: 500 [IU] via INTRAVENOUS
  Filled 2019-10-03: qty 5

## 2019-10-03 MED ORDER — HEPARIN SOD (PORK) LOCK FLUSH 100 UNIT/ML IV SOLN
500.0000 [IU] | Freq: Once | INTRAVENOUS | Status: DC | PRN
  Filled 2019-10-03: qty 5

## 2019-10-03 MED ORDER — SODIUM CHLORIDE 0.9 % IV SOLN
2000.0000 mg | Freq: Once | INTRAVENOUS | Status: AC
  Administered 2019-10-03: 2000 mg via INTRAVENOUS
  Filled 2019-10-03: qty 52.6

## 2019-10-03 MED ORDER — SODIUM CHLORIDE 0.9% FLUSH
10.0000 mL | Freq: Once | INTRAVENOUS | Status: AC
  Administered 2019-10-03: 10 mL via INTRAVENOUS
  Filled 2019-10-03: qty 10

## 2019-10-03 NOTE — Progress Notes (Signed)
Hematology/Oncology  Follow up note [facility] [phone] [phone]   Patient Care Team: Paulene Floor as PCP - General (Physician Assistant) Clent Jacks, RN as Oncology Nurse Navigator  REFERRING PROVIDER: Trinna Post, PA-C  CHIEF COMPLAINTS/REASON FOR VISIT:  Follow up for treatment of pancreatic cancer  HISTORY OF PRESENTING ILLNESS:   Craig [name] is a  73 y.o.  male with PMH listed below, including CABG x5, PVD, hypertension, former tobacco abuse, former alcohol abuse, iron deficiency anemia, was seen in consultation at the request of  Terrilee Croak, Adriana M, PA-C  for evaluation of abnormal CT Patient recently presented to primary care provider complaining for abdominal pain radiating to his back for about 10 days.  Patient uses Tylenol as needed for pain. Work-up showed elevated amylase, lipase, mild anemia with hemoglobin of 11.3 Acute hepatitis panel negative. 05/16/2019 CT abdomen pelvis with contrast showed poorly marginated heterogeneous hypodense 2.9 cm pancreatic mass at the uncinate process, worrisome for primary pancreatic cancer.  No biliary or pancreatic duct dilation. Several ill defined hypodense liver masses scattered throughout the liver, largest 3.1 cm in the segment 6 right liver lobe. Nonspecific portacaval and aortocaval adenopathy. Extreme medial left lung base 4.4 cm pleural-based mass probably a pleural metastasis.  Additional tiny pulmonary nodules scattered at the right lung base are indeterminate. Mild prostate or megaly  Patient was referred to heme-onc for further evaluation. Patient reports that his abdominal pain was 10 out of 10 a few days ago, he uses Tylenol as needed. Today his pain is getting better, 2 out of 10.  Denies any nausea, vomiting, diarrhea, fever or chills. Married, lives with wife.  Appetite is good.  He weighs 195 pounds today at the clinic. His weight was 204 pounds  back in May 2020.  He had a history of 37.5-pack-year smoking history quitting 2018. No longer drinking alcohol.  # ultrasound-guided liver biopsy on 05/21/2019.  Pathology showed fragments of benign hepatic parenchyma showing minimal macro vascular steatosis, otherwise no significant histo pathologic change. Single core of renal tissue showing approximately 25% glomerulosclerosis  # #History of CAD, status post CABG x5.  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  # Omniseq test showed PD-L1 50% TPS, TMB high, negative for BRCA1/2 or NTRK fusion.  MSI could not be completed.   INTERVAL HISTORY Zaki Gertsch is a 73 y.o. male who has above history reviewed by me today presents for follow up for evaluation prior to chemotherapy. Problems and complaints are listed below: Patient was accompanied by his wife. He reports feeling tired and fatigued, better after taking a week off. Denies any dizziness No fever, chills, nausea vomiting. Continues to have neuropathy with numbness tingling sensation of the toes and fingertips. He has been taking gabapentin 600 mg 3 times daily Appetite is good Weight has been stable.  He denies any pain today . Review of Systems  Constitutional: Positive for fatigue. Negative for appetite change, chills, fever and unexpected weight change.  HENT:   Negative for hearing loss and voice change.   Eyes: Negative for eye problems and icterus.  Respiratory: Negative for chest tightness, cough and shortness of breath.   Cardiovascular: Negative for chest pain and leg swelling.  Gastrointestinal: Negative for abdominal distention and abdominal pain.  Endocrine: Negative for hot flashes.  Genitourinary: Negative for difficulty urinating, dysuria and frequency.  Musculoskeletal: Negative for arthralgias.  Skin: Negative for itching and rash.    Neurological: Positive for numbness. Negative for light-headedness.  Hematological: Negative for adenopathy. Does not bruise/bleed easily.  Psychiatric/Behavioral: Negative for confusion.    MEDICAL HISTORY:  Past Medical History:  Diagnosis Date  . Allergic rhinitis   . Cancer [facility])    new dx Pancreatic Cancer - Aug 2020  . CHF (congestive heart failure) ([place])   . Chronic cholecystitis   . Coronary artery disease   . DDD (degenerative disc disease), cervical   . Dehydration 06/21/2019  . Dyspnea   . Early cataract   . Erectile dysfunction   . GERD (gastroesophageal reflux disease)   . Gouty arthritis   . Headache    occasional migraines  . Hypercalcemia   . Hyperlipemia   . Hypertension   . Palpitations   . Peripheral vascular disease ([place])   . 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   LIMA to LAD SVG to [place] SVG SEQUENTIALLY to OM1 and OM2 SVG to ACUTE MARGINAL  . Spinal stenosis of cervical region   . Vitamin D deficiency     SURGICAL HISTORY: Past Surgical History:  Procedure Laterality Date  . BACK SURGERY  2018    fusion with screws  . CHOLECYSTECTOMY N/A 11/13/2018   Procedure: LAPAROSCOPIC CHOLECYSTECTOMY;  Surgeon: Vickie Epley, MD;  Location: [facility] ORS;  Service: General;  Laterality: N/A;  . COLONOSCOPY    . CORONARY ARTERY BYPASS GRAFT N/A 09/30/2017   Procedure: CORONARY ARTERY BYPASS GRAFTING times five using right and left Saphaneous vein harvested endoscopicly  and left internal mammary artery. (CABG),TEE;  Surgeon: Rexene Alberts, MD;  Location: [place];  Service: Open Heart Surgery;  Laterality: N/A;  . LEFT HEART CATH AND CORONARY ANGIOGRAPHY Left 09/06/2017   Procedure: LEFT HEART CATH AND CORONARY ANGIOGRAPHY;  Surgeon: Corey Skains, MD;  Location: [place] [facility];  Service: Cardiovascular;  Laterality: Left;  . percutaneous transluminal balloon angioplasty  01/2010   of left lower extremity  . PORTA CATH INSERTION N/A 06/06/2019    Procedure: PORTA CATH INSERTION;  Surgeon: Katha Cabal, MD;  Location: [place] [facility];  Service: Cardiovascular;  Laterality: N/A;  . TEE WITHOUT CARDIOVERSION N/A 09/30/2017   Procedure: TRANSESOPHAGEAL ECHOCARDIOGRAM (TEE);  Surgeon: Rexene Alberts, MD;  Location: [place];  Service: Open Heart Surgery;  Laterality: N/A;  . VASCULAR SURGERY Left 2010   left exernal iliac and superficial femoral artery PTCA and stenting    SOCIAL HISTORY: Social History   Socioeconomic History  . Marital status: Married    Spouse name: Enid Derry  . Number of children: 2  . Years of education: Not on file  . Highest education level: Not on file  Occupational History  . Occupation: drove tractors    Comment: retired  Tobacco Use  . Smoking status: Former Smoker    Packs/day: 0.75    Years: 50.00    Pack years: 37.50    Types: Cigarettes    Quit date: 09/27/2017    Years since quitting: 2.0  . Smokeless tobacco: Former Systems developer    Types: Chew  Substance and Sexual Activity  . Alcohol use: Not Currently    Comment: stopped drinking before cabg  . Drug use: Not Currently    Types: Marijuana    Comment: stopped smoking before CABG  . Sexual activity: Not Currently  Other Topics Concern  . Not on file  Social History Narrative  .  Not on file   Social Determinants of Health   Financial Resource Strain: Low Risk   . Difficulty of Paying Living Expenses: Not hard at all  Food Insecurity: No Food Insecurity  . Worried About Charity fundraiser in the Last Year: Never true  . Ran Out of Food in the Last Year: Never true  Transportation Needs: No Transportation Needs  . Lack of Transportation (Medical): No  . Lack of Transportation (Non-Medical): No  Physical Activity: Sufficiently Active  . Days of Exercise per Week: 5 days  . Minutes of Exercise per Session: 150+ min  Stress: No Stress Concern Present  . Feeling of Stress : Not at all  Social Connections: Somewhat Isolated  .  Frequency of Communication with Friends and Family: More than three times a week  . Frequency of Social Gatherings with Friends and Family: More than three times a week  . Attends Religious Services: Never  . Active Member of Clubs or Organizations: No  . Attends Archivist Meetings: Never  . Marital Status: Married  Human resources officer Violence:   . Fear of Current or Ex-Partner: Not on file  . Emotionally Abused: Not on file  . Physically Abused: Not on file  . Sexually Abused: Not on file    FAMILY HISTORY: Family History  Problem Relation Age of Onset  . Brain cancer Mother   . Emphysema Father   . Cancer Brother     ALLERGIES:  is allergic to lipitor [atorvastatin]; zetia [ezetimibe]; lisinopril; protonix [pantoprazole]; and spironolactone.  MEDICATIONS:  Current Outpatient Medications  Medication Sig Dispense Refill  . aspirin EC 81 MG tablet Take 81 mg by mouth daily.    . carvedilol (COREG) 6.25 MG tablet Take 6.25 mg by mouth 2 (two) times daily with a meal.  3  . diltiazem (CARDIZEM CD) 300 MG 24 hr capsule TAKE 1 CAPSULE BY MOUTH EVERY DAY 90 capsule 1  . dorzolamide-timolol (COSOPT) 22.3-6.8 MG/ML ophthalmic solution INSTILL ONE DROP INTO BOTH EYES TWICE DAILY    . ferrous sulfate 325 (65 FE) MG tablet TAKE 1 TABLET BY MOUTH EVERY DAY WITH BREAKFAST 90 tablet 1  . gabapentin (NEURONTIN) 300 MG capsule TAKE 2 CAPSULES (600 MG TOTAL) BY MOUTH 3 (THREE) TIMES DAILY. 540 capsule 0  . latanoprost (XALATAN) 0.005 % ophthalmic solution Place 1 drop into both eyes at bedtime.   3  . lidocaine-prilocaine (EMLA) cream APPLY TO AFFECTED AREA ONCE AS DIRECTED    . Multiple Vitamin (MULTIVITAMIN WITH MINERALS) TABS tablet Take 1 tablet by mouth daily.    . OMEGA-3 FATTY ACIDS PO Take 1,200 mg by mouth daily.     [name] Kitchen omeprazole (PRILOSEC) 40 MG capsule TAKE 1 CAPSULE BY MOUTH EVERY DAY 90 capsule 0   No current facility-administered medications for this visit.    Facility-Administered Medications Ordered in Other Visits  Medication Dose Route Frequency Provider Last Rate Last Admin  . gemcitabine (GEMZAR) 2,000 mg in sodium chloride 0.9 % 250 mL chemo infusion  2,000 mg Intravenous Once Earlie Server, MD      . heparin lock flush 100 unit/mL  500 Units Intravenous Once Earlie Server, MD      . heparin lock flush 100 unit/mL  500 Units Intracatheter Once PRN Earlie Server, MD      . sodium chloride flush (NS) 0.9 % injection 10 mL  10 mL Intracatheter PRN Earlie Server, MD         PHYSICAL EXAMINATION: ECOG PERFORMANCE  STATUS: 1 - Symptomatic but completely ambulatory Vitals:   10/03/19 0846  BP: (!) 159/88  Pulse: 79  Resp: 18  Temp: (!) 96.3 F (35.7 C)   Filed Weights   10/03/19 0846  Weight: 179 lb 12.8 oz (81.6 kg)    Physical Exam Constitutional:      General: He is not in acute distress.    Comments: Patient walks independently  HENT:     Head: Normocephalic and atraumatic.  Eyes:     General: No scleral icterus.    Pupils: Pupils are equal, round, and reactive to light.  Cardiovascular:     Rate and Rhythm: Normal rate and regular rhythm.     Heart sounds: Normal heart sounds.  Pulmonary:     Effort: Pulmonary effort is normal. No respiratory distress.     Breath sounds: No wheezing.  Abdominal:     General: Bowel sounds are normal. There is no distension.     Palpations: Abdomen is soft. There is no mass.     Tenderness: There is no abdominal tenderness.  Musculoskeletal:        General: No deformity. Normal range of motion.     Cervical back: Normal range of motion and neck supple.  Skin:    General: Skin is warm and dry.     Findings: No erythema or rash.     Comments: Mediport+  Neurological:     General: No focal deficit present.     Mental Status: He is alert and oriented to person, place, and time.     Cranial Nerves: No cranial nerve deficit.     Coordination: Coordination normal.  Psychiatric:        Mood and Affect:  Mood normal.     LABORATORY DATA:  I have reviewed the data as listed Lab Results  Component Value Date   WBC 5.8 10/03/2019   HGB 9.2 (L) 10/03/2019   HCT 31.4 (L) 10/03/2019   MCV 95.4 10/03/2019   PLT 425 (H) 10/03/2019   Recent Labs    09/21/19 0818 09/26/19 0829 10/03/19 0827  NA 138 138 140  K 3.7 4.0 3.9  CL 107 106 107  CO2 '23 22 24  '$ GLUCOSE 108* 172* 106*  BUN '16 19 15  '$ CREATININE 1.00 0.96 0.97  CALCIUM 9.2 9.4 9.3  GFRNONAA >60 >60 >60  GFRAA >60 >60 >60  PROT 7.4 7.0 6.9  ALBUMIN 3.2* 3.1* 3.4*  AST '23 30 19  '$ ALT '26 28 18  '$ ALKPHOS 51 49 54  BILITOT 0.4 0.5 0.7   Iron/TIBC/Ferritin/ %Sat    Component Value Date/Time   IRON 119 04/27/2019 0940   TIBC 363 04/27/2019 0940   FERRITIN 58 04/27/2019 0940   IRONPCTSAT 33 04/27/2019 0940      RADIOGRAPHIC STUDIES: I have personally reviewed the radiological images as listed and agreed with the findings in the report. CT Chest W Contrast  Result Date: 09/10/2019 CLINICAL DATA:  Restaging metastatic pancreatic cancer. EXAM: CT CHEST, ABDOMEN, AND PELVIS WITH CONTRAST TECHNIQUE: Multidetector CT imaging of the chest, abdomen and pelvis was performed following the standard protocol during bolus administration of intravenous contrast. CONTRAST:  150m OMNIPAQUE IOHEXOL 300 MG/ML  SOLN COMPARISON:  PET-CT 06/04/2019 FINDINGS: CT CHEST FINDINGS Cardiovascular: The heart is normal in size. No pericardial effusion. Stable tortuosity and advanced atherosclerotic calcifications involving the thoracic aorta. 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 Stable advanced three-vessel coronary artery calcifications. Normal appearance of the pulmonary arteries. Mediastinum/Nodes:  Stable small scattered mediastinal and hilar lymph nodes. 7.5 mm right paratracheal node on image 11/504. 6 mm right paratracheal node on image 18/504. 5 mm prevascular lymph node on image 18/504. 6.5 mm subcarinal lymph node on image  26/504. The esophagus is grossly normal. Stable small scattered thyroid nodules. Lungs/Pleura: Stable emphysematous changes. No acute pulmonary findings. A few tiny scattered subpleural pulmonary nodules are stable and likely benign. Musculoskeletal: No chest wall masses are identified. Right-sided Port-A-Cath is stable. No supraclavicular or axillary adenopathy. No worrisome bone lesions to suggest metastatic disease. CT ABDOMEN PELVIS FINDINGS Hepatobiliary: No hepatic lesions are identified. The small hepatic metastatic lesions have resolved. No new lesions. The portal and hepatic veins are patent. The gallbladder is surgically absent. No common bile duct dilatation. Pancreas: Ill-defined lesion in the lower pancreatic head/uncinate process measures approximately 3 x 3 cm and appears relatively stable. The pancreatic body and tail appear normal. Spleen: Normal size.  No focal lesions. Adrenals/Urinary Tract: The adrenal glands and kidneys are unremarkable. Stable renal cysts. The bladder is unremarkable. Stomach/Bowel: The stomach, duodenum, small bowel and colon are grossly normal. No acute inflammatory changes, mass lesions or obstructive findings. The terminal ileum and appendix are normal. Vascular/Lymphatic: Stable advanced atherosclerotic calcifications involving the aorta, iliac arteries and branch vessels. No aneurysm or dissection. Marked interval reduction in size of the hypermetabolic upper abdominal lymph nodes. 7 mm node between the portal vein and IVC on image 56/504 previously measured 14.5 mm. 5.5 mm interaortocaval lymph node on image 65/504 previously measured 10.5 cm. Reproductive: Enlarged prostate gland with median lobe hypertrophy impressing on the base of the bladder. The seminal vesicles appear normal. Other: Small amount of free pelvic fluid is noted. No inguinal mass or adenopathy. 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 Musculoskeletal: No significant bony findings. Stable lumbar fusion hardware. IMPRESSION: 1. CT findings suggest a good response to treatment. No discernible residual hepatic metastatic lesions are identified. 2. Significant interval reduction in size of the abdominal lymph nodes. The pancreatic lesions difficult to measure exactly but appears relatively stable in size. 3. Stable small scattered mediastinal and hilar lymph nodes. 4. Stable small scattered subpleural pulmonary nodules, likely benign. 5. 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 Electronically Signed   By: Marijo Sanes M.D.   On: 09/10/2019 10:01   CT Abdomen Pelvis W Contrast  Result Date: 09/10/2019 CLINICAL DATA:  Restaging metastatic pancreatic cancer. EXAM: CT CHEST, ABDOMEN, AND PELVIS WITH CONTRAST TECHNIQUE: Multidetector CT imaging of the chest, abdomen and pelvis was performed following the standard protocol during bolus administration of intravenous contrast. CONTRAST:  199m OMNIPAQUE IOHEXOL 300 MG/ML  SOLN COMPARISON:  PET-CT 06/04/2019 FINDINGS: CT CHEST FINDINGS Cardiovascular: The heart is normal in size. No pericardial effusion. Stable tortuosity and advanced atherosclerotic calcifications involving the thoracic aorta. 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 Stable advanced three-vessel coronary artery calcifications. Normal appearance of the pulmonary arteries. Mediastinum/Nodes: Stable small scattered mediastinal and hilar lymph nodes. 7.5 mm right paratracheal node on image 11/504. 6 mm right paratracheal node on image 18/504. 5 mm prevascular lymph node on image 18/504. 6.5 mm subcarinal lymph node on image 26/504. The esophagus is grossly normal. Stable small scattered thyroid nodules. Lungs/Pleura: Stable emphysematous changes. No acute pulmonary  findings. A few tiny scattered subpleural pulmonary nodules are stable and likely benign. Musculoskeletal: No chest wall masses are identified. Right-sided Port-A-Cath is stable. No supraclavicular or axillary adenopathy. No worrisome bone lesions to suggest metastatic disease. CT ABDOMEN PELVIS FINDINGS Hepatobiliary: No hepatic lesions are identified. The small hepatic metastatic lesions have resolved. No new lesions. The portal and hepatic veins are patent. The gallbladder is surgically absent. No common bile duct dilatation. Pancreas: Ill-defined lesion in the lower pancreatic head/uncinate process measures approximately 3 x 3 cm and appears relatively stable. The pancreatic body and tail appear normal. Spleen: Normal size.  No focal lesions. Adrenals/Urinary Tract: The adrenal glands and kidneys are unremarkable. Stable renal cysts. The bladder is unremarkable. Stomach/Bowel: The stomach, duodenum, small bowel and colon are grossly normal. No acute inflammatory changes, mass lesions or obstructive findings. The terminal ileum and appendix are normal. Vascular/Lymphatic: Stable advanced atherosclerotic calcifications involving the aorta, iliac arteries and branch vessels. No aneurysm or dissection. Marked interval reduction in size of the hypermetabolic upper abdominal lymph nodes. 7 mm node between the portal vein and IVC on image 56/504 previously measured 14.5 mm. 5.5 mm interaortocaval lymph node on image 65/504 previously measured 10.5 cm. Reproductive: Enlarged prostate gland with median lobe hypertrophy impressing on the base of the bladder. The seminal vesicles appear normal. Other: Small amount of free pelvic fluid is noted. No inguinal mass or adenopathy. 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 Musculoskeletal: No significant bony findings. Stable lumbar fusion hardware. IMPRESSION: 1. CT  findings suggest a good response to treatment. No discernible residual hepatic metastatic lesions are identified. 2. Significant interval reduction in size of the abdominal lymph nodes. The pancreatic lesions difficult to measure exactly but appears relatively stable in size. 3. Stable small scattered mediastinal and hilar lymph nodes. 4. Stable small scattered subpleural pulmonary nodules, likely benign. 5. 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 Electronically Signed   By: Marijo Sanes M.D.   On: 09/10/2019 10:01      ASSESSMENT & PLAN:  1. Pancreatic cancer metastasized to liver ([place])   2. Encounter for antineoplastic chemotherapy   3. Neuropathy   4. Anemia due to antineoplastic chemotherapy   5. Fatigue, unspecified type   6. Goals of care, counseling/discussion    #Metastatic pancreatic cancer- liver mets, hilar nodal metastasis, ?bone mets, Labs reviewed and discussed with patient. His counts acceptable to proceed with gemcitabine 1000 mg/m. Abraxane is currently on hold due to worsening of pre-existing neuropathy.  #Anemia, secondary to chemotherapy.  His hemoglobin has improved to 9.3.  Continue to monitor #Chronic fatigue, unchanged.  Secondary to anemia and #Pre-existing neuropathy, worsened on chemotherapy.  Continue gabapentin '600mg'$   3 times daily. I had a lengthy discussion with both patient and his wife Both patient and his wife understand that patient not curable.  Goal of care is with palliative intent.  Abraxane is currently on hold due to worsening of neuropathy. Since he has had a very good response to treatment, CA 19-9 has trended down significantly from I recommend patient to continue on gemcitabine '1000mg'$ /m2 D1,8 Q21 days  maintenance for the time being.  All questions were answered. The patient knows to call the clinic with any problems questions or  concerns.   Return of  visit: 1 week,lab MD gemcitabine.  Earlie Server, MD, PhD Hematology Oncology [place]  at [facility] 10/03/2019

## 2019-10-03 NOTE — Progress Notes (Signed)
Patient prescreened for appointment. No concerns voiced today.

## 2019-10-04 LAB — CANCER ANTIGEN 19-9: CA 19-9: 258 U/mL — ABNORMAL HIGH (ref 0–35)

## 2019-10-09 ENCOUNTER — Other Ambulatory Visit: Payer: Self-pay

## 2019-10-09 NOTE — Progress Notes (Signed)
Patient pre screened for office appointment, no questions or concerns today. Patient reminded of upcoming appointment time and date. 

## 2019-10-10 ENCOUNTER — Inpatient Hospital Stay (HOSPITAL_BASED_OUTPATIENT_CLINIC_OR_DEPARTMENT_OTHER): Payer: Medicare Other | Admitting: Oncology

## 2019-10-10 ENCOUNTER — Inpatient Hospital Stay: Payer: Medicare Other

## 2019-10-10 ENCOUNTER — Other Ambulatory Visit: Payer: Self-pay

## 2019-10-10 ENCOUNTER — Encounter: Payer: Self-pay | Admitting: Oncology

## 2019-10-10 VITALS — BP 139/65 | HR 88 | Temp 97.6°F | Resp 16 | Wt 179.5 lb

## 2019-10-10 DIAGNOSIS — C787 Secondary malignant neoplasm of liver and intrahepatic bile duct: Secondary | ICD-10-CM | POA: Diagnosis not present

## 2019-10-10 DIAGNOSIS — C259 Malignant neoplasm of pancreas, unspecified: Secondary | ICD-10-CM | POA: Diagnosis not present

## 2019-10-10 DIAGNOSIS — Z5111 Encounter for antineoplastic chemotherapy: Secondary | ICD-10-CM | POA: Diagnosis not present

## 2019-10-10 LAB — CBC WITH DIFFERENTIAL/PLATELET
Abs Immature Granulocytes: 0.06 10*3/uL (ref 0.00–0.07)
Basophils Absolute: 0.1 10*3/uL (ref 0.0–0.1)
Basophils Relative: 1 %
Eosinophils Absolute: 0 10*3/uL (ref 0.0–0.5)
Eosinophils Relative: 1 %
HCT: 29.9 % — ABNORMAL LOW (ref 39.0–52.0)
Hemoglobin: 8.8 g/dL — ABNORMAL LOW (ref 13.0–17.0)
Immature Granulocytes: 2 %
Lymphocytes Relative: 21 %
Lymphs Abs: 0.8 10*3/uL (ref 0.7–4.0)
MCH: 28.2 pg (ref 26.0–34.0)
MCHC: 29.4 g/dL — ABNORMAL LOW (ref 30.0–36.0)
MCV: 95.8 fL (ref 80.0–100.0)
Monocytes Absolute: 0.5 10*3/uL (ref 0.1–1.0)
Monocytes Relative: 15 %
Neutro Abs: 2.2 10*3/uL (ref 1.7–7.7)
Neutrophils Relative %: 60 %
Platelets: 389 10*3/uL (ref 150–400)
RBC: 3.12 MIL/uL — ABNORMAL LOW (ref 4.22–5.81)
RDW: 17.9 % — ABNORMAL HIGH (ref 11.5–15.5)
WBC: 3.7 10*3/uL — ABNORMAL LOW (ref 4.0–10.5)
nRBC: 0 % (ref 0.0–0.2)

## 2019-10-10 LAB — COMPREHENSIVE METABOLIC PANEL
ALT: 26 U/L (ref 0–44)
AST: 25 U/L (ref 15–41)
Albumin: 3.3 g/dL — ABNORMAL LOW (ref 3.5–5.0)
Alkaline Phosphatase: 51 U/L (ref 38–126)
Anion gap: 8 (ref 5–15)
BUN: 17 mg/dL (ref 8–23)
CO2: 23 mmol/L (ref 22–32)
Calcium: 9.1 mg/dL (ref 8.9–10.3)
Chloride: 109 mmol/L (ref 98–111)
Creatinine, Ser: 0.87 mg/dL (ref 0.61–1.24)
GFR calc Af Amer: >60 mL/min (ref >60)
GFR calc non Af Amer: >60 mL/min (ref >60)
Glucose, Bld: 142 mg/dL — ABNORMAL HIGH (ref 70–99)
Potassium: 3.6 mmol/L (ref 3.5–5.1)
Sodium: 140 mmol/L (ref 135–145)
Total Bilirubin: 0.5 mg/dL (ref 0.3–1.2)
Total Protein: 7 g/dL (ref 6.5–8.1)

## 2019-10-10 MED ORDER — SODIUM CHLORIDE 0.9 % IV SOLN
Freq: Once | INTRAVENOUS | Status: AC
  Filled 2019-10-10: qty 250

## 2019-10-10 MED ORDER — HEPARIN SOD (PORK) LOCK FLUSH 100 UNIT/ML IV SOLN
INTRAVENOUS | Status: AC
  Filled 2019-10-10: qty 5

## 2019-10-10 MED ORDER — SODIUM CHLORIDE 0.9 % IV SOLN
2000.0000 mg | Freq: Once | INTRAVENOUS | Status: AC
  Administered 2019-10-10: 2000 mg via INTRAVENOUS
  Filled 2019-10-10: qty 52.6

## 2019-10-10 MED ORDER — HEPARIN SOD (PORK) LOCK FLUSH 100 UNIT/ML IV SOLN
500.0000 [IU] | Freq: Once | INTRAVENOUS | Status: AC | PRN
  Administered 2019-10-10: 500 [IU]
  Filled 2019-10-10: qty 5

## 2019-10-10 MED ORDER — PROCHLORPERAZINE MALEATE 10 MG PO TABS
10.0000 mg | ORAL_TABLET | Freq: Once | ORAL | Status: AC
  Administered 2019-10-10: 10 mg via ORAL
  Filled 2019-10-10: qty 1

## 2019-10-10 NOTE — Progress Notes (Signed)
Hematology/Oncology  Follow up note [facility] [phone] [phone]   Patient Care Team: Paulene Floor as PCP - General (Physician Assistant) Clent Jacks, RN as Oncology Nurse Navigator  REFERRING PROVIDER: Trinna Post, PA-C  CHIEF COMPLAINTS/REASON FOR VISIT:  Follow up for treatment of pancreatic cancer  HISTORY OF PRESENTING ILLNESS:   Craig [name] is a  73 y.o.  male with PMH listed below, including CABG x5, PVD, hypertension, former tobacco abuse, former alcohol abuse, iron deficiency anemia, was seen in consultation at the request of  Terrilee Croak, Adriana M, PA-C  for evaluation of abnormal CT Patient recently presented to primary care provider complaining for abdominal pain radiating to his back for about 10 days.  Patient uses Tylenol as needed for pain. Work-up showed elevated amylase, lipase, mild anemia with hemoglobin of 11.3 Acute hepatitis panel negative. 05/16/2019 CT abdomen pelvis with contrast showed poorly marginated heterogeneous hypodense 2.9 cm pancreatic mass at the uncinate process, worrisome for primary pancreatic cancer.  No biliary or pancreatic duct dilation. Several ill defined hypodense liver masses scattered throughout the liver, largest 3.1 cm in the segment 6 right liver lobe. Nonspecific portacaval and aortocaval adenopathy. Extreme medial left lung base 4.4 cm pleural-based mass probably a pleural metastasis.  Additional tiny pulmonary nodules scattered at the right lung base are indeterminate. Mild prostate or megaly  Patient was referred to heme-onc for further evaluation. Patient reports that his abdominal pain was 10 out of 10 a few days ago, he uses Tylenol as needed. Today his pain is getting better, 2 out of 10.  Denies any nausea, vomiting, diarrhea, fever or chills. Married, lives with wife.  Appetite is good.  He weighs 195 pounds today at the clinic. His weight was 204 pounds  back in May 2020.  He had a history of 37.5-pack-year smoking history quitting 2018. No longer drinking alcohol.  # ultrasound-guided liver biopsy on 05/21/2019.  Pathology showed fragments of benign hepatic parenchyma showing minimal macro vascular steatosis, otherwise no significant histo pathologic change. Single core of renal tissue showing approximately 25% glomerulosclerosis  # #History of CAD, status post CABG x5.  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  # Omniseq test showed PD-L1 50% TPS, TMB high, negative for BRCA1/2 or NTRK fusion.  MSI could not be completed.   INTERVAL HISTORY Craig [name] is a 73 y.o. male who has above history reviewed by me today presents for follow up for evaluation prior to chemotherapy. Problems and complaints are listed below: Patient was accompanied by his wife. Reports feeling tired kind of fatigued. Otherwise no new complaints. 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 Patient has been taking gabapentin 600 mg 3 times daily. Appetite is good .  Weight is stable. No pain today . [name] Kitchen Review of Systems  Constitutional: Positive for fatigue. Negative for appetite change, chills, fever and unexpected weight change.  HENT:   Negative for hearing loss and voice change.   Eyes: Negative for eye problems and icterus.  Respiratory: Negative for chest tightness, cough and shortness of breath.   Cardiovascular: Negative for chest pain and leg swelling.  Gastrointestinal: Negative for abdominal distention and abdominal pain.  Endocrine: Negative for hot flashes.  Genitourinary: Negative for difficulty urinating, dysuria and frequency.   Musculoskeletal: Negative for arthralgias.  Skin: Negative for itching and rash.  Neurological:  Positive for numbness. Negative for light-headedness.  Hematological: Negative for adenopathy. Does not bruise/bleed easily.  Psychiatric/Behavioral: Negative for confusion.    MEDICAL HISTORY:  Past Medical History:  Diagnosis Date   Allergic rhinitis    Cancer [facility])    new dx Pancreatic Cancer - Aug 2020   CHF (congestive heart failure) (HCC)    Chronic cholecystitis    Coronary artery disease    DDD (degenerative disc disease), cervical    Dehydration 06/21/2019   Dyspnea    Early cataract    Erectile dysfunction    GERD (gastroesophageal reflux disease)    Gouty arthritis    Headache    occasional migraines   Hypercalcemia    Hyperlipemia    Hypertension    Palpitations    Peripheral vascular disease (HCC)    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   LIMA to LAD SVG to [place] SVG SEQUENTIALLY to OM1 and OM2 SVG to ACUTE MARGINAL   Spinal stenosis of cervical region    Vitamin D deficiency     SURGICAL HISTORY: Past Surgical History:  Procedure Laterality Date   BACK SURGERY  2018    fusion with screws   CHOLECYSTECTOMY N/A 11/13/2018   Procedure: LAPAROSCOPIC CHOLECYSTECTOMY;  Surgeon: Vickie Epley, MD;  Location: [facility];  Service: General;  Laterality: N/A;   COLONOSCOPY     CORONARY ARTERY BYPASS GRAFT N/A 09/30/2017   Procedure: CORONARY ARTERY BYPASS GRAFTING times five using right and left Saphaneous vein harvested endoscopicly  and left internal mammary artery. (CABG),TEE;  Surgeon: Rexene Alberts, MD;  Location: [place];  Service: Open Heart Surgery;  Laterality: N/A;   LEFT HEART CATH AND CORONARY ANGIOGRAPHY Left 09/06/2017   Procedure: LEFT HEART CATH AND CORONARY ANGIOGRAPHY;  Surgeon: Corey Skains, MD;  Location: [place] [facility];  Service: Cardiovascular;  Laterality: Left;   percutaneous transluminal balloon angioplasty  01/2010   of left lower extremity   PORTA CATH INSERTION N/A 06/06/2019   Procedure: PORTA CATH  INSERTION;  Surgeon: Katha Cabal, MD;  Location: [place] [facility];  Service: Cardiovascular;  Laterality: N/A;   TEE WITHOUT CARDIOVERSION N/A 09/30/2017   Procedure: TRANSESOPHAGEAL ECHOCARDIOGRAM (TEE);  Surgeon: Rexene Alberts, MD;  Location: [place];  Service: Open Heart Surgery;  Laterality: N/A;   VASCULAR SURGERY Left 2010   left exernal iliac and superficial femoral artery PTCA and stenting    SOCIAL HISTORY: Social History   Socioeconomic History   Marital status: Married    Spouse name: Enid Derry   Number of children: 2   Years of education: Not on file   Highest education level: Not on file  Occupational History   Occupation: drove tractors    Comment: retired  Tobacco Use   Smoking status: Former Smoker    Packs/day: 0.75    Years: 50.00    Pack years: 37.50    Types: Cigarettes    Quit date: 09/27/2017    Years since quitting: 2.0   Smokeless tobacco: Former Systems developer    Types: Chew  Substance and Sexual Activity   Alcohol use: Not Currently    Comment: stopped drinking before cabg   Drug use: Not Currently    Types: Marijuana    Comment: stopped smoking before CABG   Sexual activity: Not Currently  Other Topics Concern   Not on file  Social History Narrative   Not on file  Social Determinants of Health   Financial Resource Strain: Low Risk    Difficulty of Paying Living Expenses: Not hard at all  Food Insecurity: No Food Insecurity   Worried About Charity fundraiser in the Last Year: Never true   [place] in the Last Year: Never true  Transportation Needs: No Transportation Needs   Lack of Transportation (Medical): No   Lack of Transportation (Non-Medical): No  Physical Activity: Sufficiently Active   Days of Exercise per Week: 5 days   Minutes of Exercise per Session: 150+ min  Stress: No Stress Concern Present   Feeling of Stress : Not at all  Social Connections: Somewhat Isolated   Frequency of Communication  with Friends and Family: More than three times a week   Frequency of Social Gatherings with Friends and Family: More than three times a week   Attends Religious Services: Never   Marine scientist or Organizations: No   Attends Music therapist: Never   Marital Status: Married  Human resources officer Violence:    Fear of Current or Ex-Partner: Not on file   Emotionally Abused: Not on file   Physically Abused: Not on file   Sexually Abused: Not on file    FAMILY HISTORY: Family History  Problem Relation Age of Onset   Brain cancer Mother    Emphysema Father    Cancer Brother     ALLERGIES:  is allergic to lipitor [atorvastatin]; zetia [ezetimibe]; lisinopril; protonix [pantoprazole]; and spironolactone.  MEDICATIONS:  Current Outpatient Medications  Medication Sig Dispense Refill   aspirin EC 81 MG tablet Take 81 mg by mouth daily.     carvedilol (COREG) 6.25 MG tablet Take 6.25 mg by mouth 2 (two) times daily with a meal.  3   diltiazem (CARDIZEM CD) 300 MG 24 hr capsule TAKE 1 CAPSULE BY MOUTH EVERY DAY 90 capsule 1   dorzolamide-timolol (COSOPT) 22.3-6.8 MG/ML ophthalmic solution INSTILL ONE DROP INTO BOTH EYES TWICE DAILY     ferrous sulfate 325 (65 FE) MG tablet TAKE 1 TABLET BY MOUTH EVERY DAY WITH BREAKFAST 90 tablet 1   gabapentin (NEURONTIN) 300 MG capsule TAKE 2 CAPSULES (600 MG TOTAL) BY MOUTH 3 (THREE) TIMES DAILY. 540 capsule 0   latanoprost (XALATAN) 0.005 % ophthalmic solution Place 1 drop into both eyes at bedtime.   3   lidocaine-prilocaine (EMLA) cream APPLY TO AFFECTED AREA ONCE AS DIRECTED     Multiple Vitamin (MULTIVITAMIN WITH MINERALS) TABS tablet Take 1 tablet by mouth daily.     OMEGA-3 FATTY ACIDS PO Take 1,200 mg by mouth daily.      omeprazole (PRILOSEC) 40 MG capsule TAKE 1 CAPSULE BY MOUTH EVERY DAY 90 capsule 0   No current facility-administered medications for this visit.   Facility-Administered Medications  Ordered in Other Visits  Medication Dose Route Frequency Provider Last Rate Last Admin   sodium chloride flush (NS) 0.9 % injection 10 mL  10 mL Intracatheter PRN Earlie Server, MD         PHYSICAL EXAMINATION: ECOG PERFORMANCE STATUS: 1 - Symptomatic but completely ambulatory Vitals:   10/10/19 0836  BP: 139/65  Pulse: 88  Resp: 16  Temp: 97.6 F (36.4 C)  SpO2: 100%   Filed Weights   10/10/19 0836  Weight: 179 lb 8 oz (81.4 kg)    Physical Exam Constitutional:      General: He is not in acute distress.    Comments: Patient walks independently  HENT:     Head: Normocephalic and atraumatic.  Eyes:     General: No scleral icterus.    Pupils: Pupils are equal, round, and reactive to light.  Cardiovascular:     Rate and Rhythm: Normal rate and regular rhythm.     Heart sounds: Normal heart sounds.  Pulmonary:     Effort: Pulmonary effort is normal. No respiratory distress.     Breath sounds: No wheezing.  Abdominal:     General: Bowel sounds are normal. There is no distension.     Palpations: Abdomen is soft. There is no mass.     Tenderness: There is no abdominal tenderness.  Musculoskeletal:        General: No deformity. Normal range of motion.     Cervical back: Normal range of motion and neck supple.  Skin:    General: Skin is warm and dry.     Findings: No erythema or rash.     Comments: Mediport+  Neurological:     General: No focal deficit present.     Mental Status: He is alert and oriented to person, place, and time.     Cranial Nerves: No cranial nerve deficit.     Coordination: Coordination normal.  Psychiatric:        Mood and Affect: Mood normal.        Behavior: Behavior normal.        Thought Content: Thought content normal.     LABORATORY DATA:  I have reviewed the data as listed Lab Results  Component Value Date   WBC 3.7 (L) 10/10/2019   HGB 8.8 (L) 10/10/2019   HCT 29.9 (L) 10/10/2019   MCV 95.8 10/10/2019   PLT 389 10/10/2019   Recent  Labs    09/26/19 0829 10/03/19 0827 10/10/19 0819  NA 138 140 140  K 4.0 3.9 3.6  CL 106 107 109  CO2 '22 24 23  '$ GLUCOSE 172* 106* 142*  BUN '19 15 17  '$ CREATININE 0.96 0.97 0.87  CALCIUM 9.4 9.3 9.1  GFRNONAA >60 >60 >60  GFRAA >60 >60 >60  PROT 7.0 6.9 7.0  ALBUMIN 3.1* 3.4* 3.3*  AST '30 19 25  '$ ALT '28 18 26  '$ ALKPHOS 49 54 51  BILITOT 0.5 0.7 0.5   Iron/TIBC/Ferritin/ %Sat    Component Value Date/Time   IRON 119 04/27/2019 0940   TIBC 363 04/27/2019 0940   FERRITIN 58 04/27/2019 0940   IRONPCTSAT 33 04/27/2019 0940      RADIOGRAPHIC STUDIES: I have personally reviewed the radiological images as listed and agreed with the findings in the report. CT Chest W Contrast  Result Date: 09/10/2019 CLINICAL DATA:  Restaging metastatic pancreatic cancer. EXAM: CT CHEST, ABDOMEN, AND PELVIS WITH CONTRAST TECHNIQUE: Multidetector CT imaging of the chest, abdomen and pelvis was performed following the standard protocol during bolus administration of intravenous contrast. CONTRAST:  132m OMNIPAQUE IOHEXOL 300 MG/ML  SOLN COMPARISON:  PET-CT 06/04/2019 FINDINGS: CT CHEST FINDINGS Cardiovascular: The heart is normal in size. No pericardial effusion. Stable tortuosity and advanced atherosclerotic calcifications involving the thoracic aorta. 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 Stable advanced three-vessel coronary artery calcifications. Normal appearance of the pulmonary arteries. Mediastinum/Nodes: Stable small scattered mediastinal and hilar lymph nodes. 7.5 mm right paratracheal node on image 11/504. 6 mm right paratracheal node on image 18/504. 5 mm prevascular lymph node on image 18/504. 6.5 mm subcarinal lymph node on image 26/504. The esophagus is grossly normal. Stable small scattered thyroid  nodules. Lungs/Pleura: Stable emphysematous changes. No acute pulmonary findings. A few tiny scattered subpleural pulmonary nodules are stable and likely benign.  Musculoskeletal: No chest wall masses are identified. Right-sided Port-A-Cath is stable. No supraclavicular or axillary adenopathy. No worrisome bone lesions to suggest metastatic disease. CT ABDOMEN PELVIS FINDINGS Hepatobiliary: No hepatic lesions are identified. The small hepatic metastatic lesions have resolved. No new lesions. The portal and hepatic veins are patent. The gallbladder is surgically absent. No common bile duct dilatation. Pancreas: Ill-defined lesion in the lower pancreatic head/uncinate process measures approximately 3 x 3 cm and appears relatively stable. The pancreatic body and tail appear normal. Spleen: Normal size.  No focal lesions. Adrenals/Urinary Tract: The adrenal glands and kidneys are unremarkable. Stable renal cysts. The bladder is unremarkable. Stomach/Bowel: The stomach, duodenum, small bowel and colon are grossly normal. No acute inflammatory changes, mass lesions or obstructive findings. The terminal ileum and appendix are normal. Vascular/Lymphatic: Stable advanced atherosclerotic calcifications involving the aorta, iliac arteries and branch vessels. No aneurysm or dissection. Marked interval reduction in size of the hypermetabolic upper abdominal lymph nodes. 7 mm node between the portal vein and IVC on image 56/504 previously measured 14.5 mm. 5.5 mm interaortocaval lymph node on image 65/504 previously measured 10.5 cm. Reproductive: Enlarged prostate gland with median lobe hypertrophy impressing on the base of the bladder. The seminal vesicles appear normal. Other: Small amount of free pelvic fluid is noted. No inguinal mass or adenopathy. 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 Musculoskeletal: No significant bony findings. Stable lumbar fusion hardware. IMPRESSION: 1. CT findings suggest a good response to treatment. No discernible residual hepatic metastatic  lesions are identified. 2. Significant interval reduction in size of the abdominal lymph nodes. The pancreatic lesions difficult to measure exactly but appears relatively stable in size. 3. Stable small scattered mediastinal and hilar lymph nodes. 4. Stable small scattered subpleural pulmonary nodules, likely benign. 5. 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 Electronically Signed   By: Marijo Sanes M.D.   On: 09/10/2019 10:01   CT Abdomen Pelvis W Contrast  Result Date: 09/10/2019 CLINICAL DATA:  Restaging metastatic pancreatic cancer. EXAM: CT CHEST, ABDOMEN, AND PELVIS WITH CONTRAST TECHNIQUE: Multidetector CT imaging of the chest, abdomen and pelvis was performed following the standard protocol during bolus administration of intravenous contrast. CONTRAST:  17m OMNIPAQUE IOHEXOL 300 MG/ML  SOLN COMPARISON:  PET-CT 06/04/2019 FINDINGS: CT CHEST FINDINGS Cardiovascular: The heart is normal in size. No pericardial effusion. Stable tortuosity and advanced atherosclerotic calcifications involving the thoracic aorta. 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 Stable advanced three-vessel coronary artery calcifications. Normal appearance of the pulmonary arteries. Mediastinum/Nodes: Stable small scattered mediastinal and hilar lymph nodes. 7.5 mm right paratracheal node on image 11/504. 6 mm right paratracheal node on image 18/504. 5 mm prevascular lymph node on image 18/504. 6.5 mm subcarinal lymph node on image 26/504. The esophagus is grossly normal. Stable small scattered thyroid nodules. Lungs/Pleura: Stable emphysematous changes. No acute pulmonary findings. A few tiny scattered subpleural pulmonary nodules are stable and likely benign. Musculoskeletal: No chest wall masses are identified. Right-sided Port-A-Cath is stable. No supraclavicular or axillary adenopathy. No worrisome bone  lesions to suggest metastatic disease. CT ABDOMEN PELVIS FINDINGS Hepatobiliary: No hepatic lesions are identified. The  small hepatic metastatic lesions have resolved. No new lesions. The portal and hepatic veins are patent. The gallbladder is surgically absent. No common bile duct dilatation. Pancreas: Ill-defined lesion in the lower pancreatic head/uncinate process measures approximately 3 x 3 cm and appears relatively stable. The pancreatic body and tail appear normal. Spleen: Normal size.  No focal lesions. Adrenals/Urinary Tract: The adrenal glands and kidneys are unremarkable. Stable renal cysts. The bladder is unremarkable. Stomach/Bowel: The stomach, duodenum, small bowel and colon are grossly normal. No acute inflammatory changes, mass lesions or obstructive findings. The terminal ileum and appendix are normal. Vascular/Lymphatic: Stable advanced atherosclerotic calcifications involving the aorta, iliac arteries and branch vessels. No aneurysm or dissection. Marked interval reduction in size of the hypermetabolic upper abdominal lymph nodes. 7 mm node between the portal vein and IVC on image 56/504 previously measured 14.5 mm. 5.5 mm interaortocaval lymph node on image 65/504 previously measured 10.5 cm. Reproductive: Enlarged prostate gland with median lobe hypertrophy impressing on the base of the bladder. The seminal vesicles appear normal. Other: Small amount of free pelvic fluid is noted. No inguinal mass or adenopathy. 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 Musculoskeletal: No significant bony findings. Stable lumbar fusion hardware. IMPRESSION: 1. CT findings suggest a good response to treatment. No discernible residual hepatic metastatic lesions are identified. 2. Significant interval reduction in size of the abdominal lymph nodes. The pancreatic lesions difficult to measure exactly  but appears relatively stable in size. 3. Stable small scattered mediastinal and hilar lymph nodes. 4. Stable small scattered subpleural pulmonary nodules, likely benign. 5. 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 Electronically Signed   By: Marijo Sanes M.D.   On: 09/10/2019 10:01      ASSESSMENT & PLAN:  1. Pancreatic cancer metastasized to liver [facility])    #Metastatic pancreatic cancer- liver mets, hilar nodal metastasis, ?bone mets, Labs are reviewed and discussed with patient. His counts are acceptable to proceed with gemcitabine 1000 mg/m. Abraxane is currently on hold due to worsening of pre-existing neuropathy. I discussed with patient and wife that gemcitabine alone may not control his disease as well as the combination.  His tumor marker has slightly trended up.  Continue close monitoring.  Both patient and wife voiced understanding and agree with the plan.  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  Continue gabapentin 600 mg 3 times daily. #Anemia, multifactorial, secondary to chemo, comorbidities. Anticipate hemoglobin will further drop after today's chemotherapy. Have patient follow-up with CBC hold tube.  Transfuse if hemoglobin less than 7 or if patient is symptomatic.  All questions were answered. The patient knows to call the clinic with any problems questions or concerns.   Return of visit: 1 week to repeat CBC for possible blood transfusion Follow-up in 2 weeks with labs and MD assessment for the next cycle of gemcitabine treatments.  Earlie Server, MD, PhD Hematology Oncology [place] at [facility] 10/10/2019

## 2019-10-11 ENCOUNTER — Telehealth: Payer: Self-pay | Admitting: Physician Assistant

## 2019-10-11 NOTE — Telephone Encounter (Signed)
[facility] faxed refill request for the following medications:  valsartan (DIOVAN) 160 MG tablet [id number]    Please advise.  Thanks, [employer]

## 2019-10-11 NOTE — Telephone Encounter (Signed)
Medication not on patient's medication list. Please advise.

## 2019-10-15 ENCOUNTER — Ambulatory Visit
Admission: RE | Admit: 2019-10-15 | Discharge: 2019-10-15 | Disposition: A | Discharge status: Home / Self Care | Payer: Medicare Other | Attending: Oncology | Admitting: Oncology

## 2019-10-15 ENCOUNTER — Telehealth: Payer: Self-pay

## 2019-10-15 ENCOUNTER — Other Ambulatory Visit: Payer: Self-pay

## 2019-10-15 ENCOUNTER — Ambulatory Visit
Admission: RE | Admit: 2019-10-15 | Discharge: 2019-10-15 | Disposition: A | Discharge status: Home / Self Care | Payer: Medicare Other | Source: Ambulatory Visit | Attending: Oncology | Admitting: Oncology

## 2019-10-15 ENCOUNTER — Encounter: Payer: Self-pay | Admitting: Oncology

## 2019-10-15 ENCOUNTER — Inpatient Hospital Stay: Payer: Medicare Other | Attending: Oncology | Admitting: Oncology

## 2019-10-15 ENCOUNTER — Telehealth: Payer: Self-pay | Admitting: *Deleted

## 2019-10-15 VITALS — BP 133/75 | HR 85 | Temp 97.3°F | Resp 18 | Wt 176.9 lb

## 2019-10-15 DIAGNOSIS — M25461 Effusion, right knee: Secondary | ICD-10-CM | POA: Diagnosis not present

## 2019-10-15 DIAGNOSIS — R109 Unspecified abdominal pain: Secondary | ICD-10-CM | POA: Insufficient documentation

## 2019-10-15 DIAGNOSIS — I700 Atherosclerosis of aorta: Secondary | ICD-10-CM | POA: Insufficient documentation

## 2019-10-15 DIAGNOSIS — Z888 Allergy status to other drugs, medicaments and biological substances status: Secondary | ICD-10-CM | POA: Diagnosis not present

## 2019-10-15 DIAGNOSIS — Z809 Family history of malignant neoplasm, unspecified: Secondary | ICD-10-CM | POA: Insufficient documentation

## 2019-10-15 DIAGNOSIS — M25561 Pain in right knee: Secondary | ICD-10-CM

## 2019-10-15 DIAGNOSIS — C787 Secondary malignant neoplasm of liver and intrahepatic bile duct: Secondary | ICD-10-CM | POA: Diagnosis not present

## 2019-10-15 DIAGNOSIS — M2550 Pain in unspecified joint: Secondary | ICD-10-CM | POA: Insufficient documentation

## 2019-10-15 DIAGNOSIS — R599 Enlarged lymph nodes, unspecified: Secondary | ICD-10-CM | POA: Insufficient documentation

## 2019-10-15 DIAGNOSIS — I2510 Atherosclerotic heart disease of native coronary artery without angina pectoris: Secondary | ICD-10-CM | POA: Diagnosis not present

## 2019-10-15 DIAGNOSIS — R262 Difficulty in walking, not elsewhere classified: Secondary | ICD-10-CM | POA: Diagnosis not present

## 2019-10-15 DIAGNOSIS — E785 Hyperlipidemia, unspecified: Secondary | ICD-10-CM | POA: Insufficient documentation

## 2019-10-15 DIAGNOSIS — Q741 Congenital malformation of knee: Secondary | ICD-10-CM | POA: Insufficient documentation

## 2019-10-15 DIAGNOSIS — Z9049 Acquired absence of other specified parts of digestive tract: Secondary | ICD-10-CM | POA: Insufficient documentation

## 2019-10-15 DIAGNOSIS — Z5111 Encounter for antineoplastic chemotherapy: Secondary | ICD-10-CM | POA: Diagnosis not present

## 2019-10-15 DIAGNOSIS — C771 Secondary and unspecified malignant neoplasm of intrathoracic lymph nodes: Secondary | ICD-10-CM | POA: Diagnosis not present

## 2019-10-15 DIAGNOSIS — C259 Malignant neoplasm of pancreas, unspecified: Secondary | ICD-10-CM | POA: Insufficient documentation

## 2019-10-15 DIAGNOSIS — G629 Polyneuropathy, unspecified: Secondary | ICD-10-CM | POA: Insufficient documentation

## 2019-10-15 DIAGNOSIS — N281 Cyst of kidney, acquired: Secondary | ICD-10-CM | POA: Insufficient documentation

## 2019-10-15 DIAGNOSIS — M25562 Pain in left knee: Secondary | ICD-10-CM | POA: Insufficient documentation

## 2019-10-15 DIAGNOSIS — I110 Hypertensive heart disease with heart failure: Secondary | ICD-10-CM | POA: Diagnosis not present

## 2019-10-15 DIAGNOSIS — C7951 Secondary malignant neoplasm of bone: Secondary | ICD-10-CM | POA: Insufficient documentation

## 2019-10-15 DIAGNOSIS — Z85038 Personal history of other malignant neoplasm of large intestine: Secondary | ICD-10-CM | POA: Insufficient documentation

## 2019-10-15 DIAGNOSIS — M7989 Other specified soft tissue disorders: Secondary | ICD-10-CM | POA: Diagnosis not present

## 2019-10-15 DIAGNOSIS — K219 Gastro-esophageal reflux disease without esophagitis: Secondary | ICD-10-CM | POA: Diagnosis not present

## 2019-10-15 DIAGNOSIS — R160 Hepatomegaly, not elsewhere classified: Secondary | ICD-10-CM | POA: Insufficient documentation

## 2019-10-15 DIAGNOSIS — R200 Anesthesia of skin: Secondary | ICD-10-CM | POA: Diagnosis not present

## 2019-10-15 DIAGNOSIS — Z79899 Other long term (current) drug therapy: Secondary | ICD-10-CM | POA: Insufficient documentation

## 2019-10-15 DIAGNOSIS — R5383 Other fatigue: Secondary | ICD-10-CM | POA: Diagnosis not present

## 2019-10-15 DIAGNOSIS — E042 Nontoxic multinodular goiter: Secondary | ICD-10-CM | POA: Diagnosis not present

## 2019-10-15 DIAGNOSIS — I739 Peripheral vascular disease, unspecified: Secondary | ICD-10-CM | POA: Insufficient documentation

## 2019-10-15 DIAGNOSIS — Z836 Family history of other diseases of the respiratory system: Secondary | ICD-10-CM | POA: Insufficient documentation

## 2019-10-15 DIAGNOSIS — Z808 Family history of malignant neoplasm of other organs or systems: Secondary | ICD-10-CM | POA: Insufficient documentation

## 2019-10-15 DIAGNOSIS — R918 Other nonspecific abnormal finding of lung field: Secondary | ICD-10-CM | POA: Insufficient documentation

## 2019-10-15 DIAGNOSIS — N400 Enlarged prostate without lower urinary tract symptoms: Secondary | ICD-10-CM | POA: Diagnosis not present

## 2019-10-15 DIAGNOSIS — Z87891 Personal history of nicotine dependence: Secondary | ICD-10-CM | POA: Insufficient documentation

## 2019-10-15 MED ORDER — IBUPROFEN 800 MG PO TABS: 800.0000 mg | ORAL_TABLET | Freq: Three times a day (TID) | ORAL | 0 refills | Status: DC | PRN

## 2019-10-15 NOTE — Telephone Encounter (Signed)
Patient called stating that he had chemotherapy on Wednesday and that he awoke on Friday with his right knee swollen and dark on the top. He denies injuring himself in any way. He is asking if he needs to see [place] or someone else. Please advise

## 2019-10-15 NOTE — Telephone Encounter (Signed)
Dr. Tasia Catchings recommends knee x-ray since the pain is in knee and not calf.  I will enter the order.

## 2019-10-15 NOTE — Telephone Encounter (Signed)
Patient informed of order for his knee and that Dr Tasia Catchings will see him as well at 130 today

## 2019-10-15 NOTE — Progress Notes (Signed)
Patient here with complaints of swollen right knee. This started thursday with small pain to side of knee and then when he woke up Friday, it was swollen. Patient described pain as a "toothache pain, it aches and aches and aches." Pain is worse when he tries to stand up. Patient had knee xray prior to coming. He took tylenol last night but it did not help. He also used pain relief cream but didn't help much.

## 2019-10-15 NOTE — Telephone Encounter (Signed)
Will also get him scheduled to see Dr. Tasia Catchings for evaluation.

## 2019-10-15 NOTE — Telephone Encounter (Signed)
Patient has an appt with:  [facility], Dr. Candelaria Stagers, on Wed 10/17/19 @ 2:30.  Phone: [phone] Fax: [phone]  Patient is aware.  If his appt here on Wed is not done in time for this appt we will call to get it r/s.

## 2019-10-15 NOTE — Telephone Encounter (Signed)
I think this has been discontinued due to hypotension in the setting of cancer treatment. Will hold off for now, he visits with oncology frequently and his Bps have been normotensive.

## 2019-10-16 ENCOUNTER — Telehealth: Payer: Self-pay | Admitting: Physician Assistant

## 2019-10-16 NOTE — Telephone Encounter (Signed)
Verified with patient that he is no longer taking Valsartan 160mg .

## 2019-10-16 NOTE — Progress Notes (Addendum)
Hematology/Oncology  Follow up note [facility] [phone] [phone]   Patient Care Team: Paulene Floor as PCP - General (Physician Assistant) Clent Jacks, RN as Oncology Nurse Navigator  REFERRING PROVIDER: Trinna Post, PA-C  CHIEF COMPLAINTS/REASON FOR VISIT:  Follow up for treatment of pancreatic cancer  HISTORY OF PRESENTING ILLNESS:   Craig [name] is a  74 y.o.  male with PMH listed below, including CABG x5, PVD, hypertension, former tobacco abuse, former alcohol abuse, iron deficiency anemia, was seen in consultation at the request of  Terrilee Croak, Adriana M, PA-C  for evaluation of abnormal CT Patient recently presented to primary care provider complaining for abdominal pain radiating to his back for about 10 days.  Patient uses Tylenol as needed for pain. Work-up showed elevated amylase, lipase, mild anemia with hemoglobin of 11.3 Acute hepatitis panel negative. 05/16/2019 CT abdomen pelvis with contrast showed poorly marginated heterogeneous hypodense 2.9 cm pancreatic mass at the uncinate process, worrisome for primary pancreatic cancer.  No biliary or pancreatic duct dilation. Several ill defined hypodense liver masses scattered throughout the liver, largest 3.1 cm in the segment 6 right liver lobe. Nonspecific portacaval and aortocaval adenopathy. Extreme medial left lung base 4.4 cm pleural-based mass probably a pleural metastasis.  Additional tiny pulmonary nodules scattered at the right lung base are indeterminate. Mild prostate or megaly  Patient was referred to heme-onc for further evaluation. Patient reports that his abdominal pain was 10 out of 10 a few days ago, he uses Tylenol as needed. Today his pain is getting better, 2 out of 10.  Denies any nausea, vomiting, diarrhea, fever or chills. Married, lives with wife.  Appetite is good.  He weighs 195 pounds today at the clinic. His weight was 204 pounds  back in May 2020.  He had a history of 37.5-pack-year smoking history quitting 2018. No longer drinking alcohol.  # ultrasound-guided liver biopsy on 05/21/2019.  Pathology showed fragments of benign hepatic parenchyma showing minimal macro vascular steatosis, otherwise no significant histo pathologic change. Single core of renal tissue showing approximately 25% glomerulosclerosis  # #History of CAD, status post CABG x5.  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  # Omniseq test showed PD-L1 50% TPS, TMB high, negative for BRCA1/2 or NTRK fusion.  MSI could not be completed.   INTERVAL HISTORY Craig [name] is a 74 y.o. male who has above history reviewed by me today presents for acute visit  problems and complaints are listed below: Patient has been on palliative chemotherapy with single agent Abraxane for treatment of pancreatic cancer.  He received chemotherapy last week. Patient reports acute onset of right knee pain for 2 to 3 days.  He reports right knee was swollen during the weekend, today swelling has improved a little bit.  He also noticed some skin darkening around his knee area.  Denies any injury or fall. Denies any fever or chills. No nausea vomiting. Appetite is good. . . Review of Systems  Constitutional: Positive for fatigue. Negative for appetite change, chills, fever and unexpected weight change.  HENT:   Negative for hearing loss and voice change.   Eyes: Negative for eye problems and icterus.  Respiratory: Negative for chest tightness, cough and shortness of breath.   Cardiovascular: Negative for chest pain and leg swelling.  Gastrointestinal: Negative for abdominal distention and abdominal pain.  Endocrine: Negative  for hot flashes.  Genitourinary: Negative for difficulty urinating, dysuria and frequency.   Musculoskeletal: Positive for  arthralgias.  Skin: Negative for itching and rash.  Neurological: Positive for numbness. Negative for light-headedness.  Hematological: Negative for adenopathy. Does not bruise/bleed easily.  Psychiatric/Behavioral: Negative for confusion.    MEDICAL HISTORY:  Past Medical History:  Diagnosis Date  . Allergic rhinitis   . Cancer [facility])    new dx Pancreatic Cancer - Aug 2020  . CHF (congestive heart failure) ([place])   . Chronic cholecystitis   . Coronary artery disease   . DDD (degenerative disc disease), cervical   . Dehydration 06/21/2019  . Dyspnea   . Early cataract   . Erectile dysfunction   . GERD (gastroesophageal reflux disease)   . Gouty arthritis   . Headache    occasional migraines  . Hypercalcemia   . Hyperlipemia   . Hypertension   . Palpitations   . Peripheral vascular disease ([place])   . 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   LIMA to LAD SVG to [place] SVG SEQUENTIALLY to OM1 and OM2 SVG to ACUTE MARGINAL  . Spinal stenosis of cervical region   . Vitamin D deficiency     SURGICAL HISTORY: Past Surgical History:  Procedure Laterality Date  . BACK SURGERY  2018    fusion with screws  . CHOLECYSTECTOMY N/A 11/13/2018   Procedure: LAPAROSCOPIC CHOLECYSTECTOMY;  Surgeon: Vickie Epley, MD;  Location: [facility];  Service: General;  Laterality: N/A;  . COLONOSCOPY    . CORONARY ARTERY BYPASS GRAFT N/A 09/30/2017   Procedure: CORONARY ARTERY BYPASS GRAFTING times five using right and left Saphaneous vein harvested endoscopicly  and left internal mammary artery. (CABG),TEE;  Surgeon: Rexene Alberts, MD;  Location: [place];  Service: Open Heart Surgery;  Laterality: N/A;  . LEFT HEART CATH AND CORONARY ANGIOGRAPHY Left 09/06/2017   Procedure: LEFT HEART CATH AND CORONARY ANGIOGRAPHY;  Surgeon: Corey Skains, MD;  Location: [place] CV LAB;  Service: Cardiovascular;  Laterality: Left;  . percutaneous transluminal balloon angioplasty  01/2010   of left lower  extremity  . PORTA CATH INSERTION N/A 06/06/2019   Procedure: PORTA CATH INSERTION;  Surgeon: Katha Cabal, MD;  Location: [place] CV LAB;  Service: Cardiovascular;  Laterality: N/A;  . TEE WITHOUT CARDIOVERSION N/A 09/30/2017   Procedure: TRANSESOPHAGEAL ECHOCARDIOGRAM (TEE);  Surgeon: Rexene Alberts, MD;  Location: [place];  Service: Open Heart Surgery;  Laterality: N/A;  . VASCULAR SURGERY Left 2010   left exernal iliac and superficial femoral artery PTCA and stenting    SOCIAL HISTORY: Social History   Socioeconomic History  . Marital status: Married    Spouse name: Enid Derry  . Number of children: 2  . Years of education: Not on file  . Highest education level: Not on file  Occupational History  . Occupation: drove tractors    Comment: retired  Tobacco Use  . Smoking status: Former Smoker    Packs/day: 0.75    Years: 50.00    Pack years: 37.50    Types: Cigarettes    Quit date: 09/27/2017    Years since quitting: 2.0  . Smokeless tobacco: Former Systems developer    Types: Chew  Substance and Sexual Activity  . Alcohol use: Not Currently    Comment: stopped drinking before cabg  . Drug use: Not Currently    Types: Marijuana    Comment: stopped smoking before CABG  . Sexual activity: Not Currently  Other Topics Concern  . Not on file  Social History Narrative  . Not on file   Social Determinants of Health   Financial Resource Strain: Low Risk   . Difficulty of Paying Living Expenses: Not hard at all  Food Insecurity: No Food Insecurity  . Worried About Charity fundraiser in the Last Year: Never true  . Ran Out of Food in the Last Year: Never true  Transportation Needs: No Transportation Needs  . Lack of Transportation (Medical): No  . Lack of Transportation (Non-Medical): No  Physical Activity: Sufficiently Active  . Days of Exercise per Week: 5 days  . Minutes of Exercise per Session: 150+ min  Stress: No Stress Concern Present  . Feeling of Stress : Not at  all  Social Connections: Somewhat Isolated  . Frequency of Communication with Friends and Family: More than three times a week  . Frequency of Social Gatherings with Friends and Family: More than three times a week  . Attends Religious Services: Never  . Active Member of Clubs or Organizations: No  . Attends Archivist Meetings: Never  . Marital Status: Married  Human resources officer Violence:   . Fear of Current or Ex-Partner: Not on file  . Emotionally Abused: Not on file  . Physically Abused: Not on file  . Sexually Abused: Not on file    FAMILY HISTORY: Family History  Problem Relation Age of Onset  . Brain cancer Mother   . Emphysema Father   . Cancer Brother     ALLERGIES:  is allergic to lipitor [atorvastatin]; zetia [ezetimibe]; lisinopril; protonix [pantoprazole]; and spironolactone.  MEDICATIONS:  Current Outpatient Medications  Medication Sig Dispense Refill  . aspirin EC 81 MG tablet Take 81 mg by mouth daily.    . carvedilol (COREG) 6.25 MG tablet Take 6.25 mg by mouth 2 (two) times daily with a meal.  3  . diltiazem (CARDIZEM CD) 300 MG 24 hr capsule TAKE 1 CAPSULE BY MOUTH EVERY DAY 90 capsule 1  . dorzolamide-timolol (COSOPT) 22.3-6.8 MG/ML ophthalmic solution INSTILL ONE DROP INTO BOTH EYES TWICE DAILY    . ferrous sulfate 325 (65 FE) MG tablet TAKE 1 TABLET BY MOUTH EVERY DAY WITH BREAKFAST 90 tablet 1  . gabapentin (NEURONTIN) 300 MG capsule TAKE 2 CAPSULES (600 MG TOTAL) BY MOUTH 3 (THREE) TIMES DAILY. 540 capsule 0  . latanoprost (XALATAN) 0.005 % ophthalmic solution Place 1 drop into both eyes at bedtime.   3  . lidocaine-prilocaine (EMLA) cream APPLY TO AFFECTED AREA ONCE AS DIRECTED    . Multiple Vitamin (MULTIVITAMIN WITH MINERALS) TABS tablet Take 1 tablet by mouth daily.    . OMEGA-3 FATTY ACIDS PO Take 1,200 mg by mouth daily.     [name] Kitchen omeprazole (PRILOSEC) 40 MG capsule TAKE 1 CAPSULE BY MOUTH EVERY DAY 90 capsule 0  . ibuprofen (ADVIL) 800 MG  tablet Take 1 tablet (800 mg total) by mouth every 8 (eight) hours as needed. 30 tablet 0   No current facility-administered medications for this visit.   Facility-Administered Medications Ordered in Other Visits  Medication Dose Route Frequency Provider Last Rate Last Admin  . sodium chloride flush (NS) 0.9 % injection 10 mL  10 mL Intracatheter PRN Earlie Server, MD         PHYSICAL EXAMINATION: ECOG PERFORMANCE STATUS: 1 - Symptomatic but completely ambulatory Vitals:   10/15/19 1345  BP: 133/75  Pulse: 85  Resp: 18  Temp: (!) 97.3 F (36.3 C)  Filed Weights   10/15/19 1345  Weight: 176 lb 14.4 oz (80.2 kg)    Physical Exam Constitutional:      General: He is not in acute distress.    Comments: Patient walks independently  HENT:     Head: Normocephalic and atraumatic.  Eyes:     General: No scleral icterus.    Pupils: Pupils are equal, round, and reactive to light.  Cardiovascular:     Rate and Rhythm: Normal rate and regular rhythm.     Heart sounds: Normal heart sounds.  Pulmonary:     Effort: Pulmonary effort is normal. No respiratory distress.     Breath sounds: No wheezing.  Abdominal:     General: Bowel sounds are normal. There is no distension.     Palpations: Abdomen is soft. There is no mass.     Tenderness: There is no abdominal tenderness.  Musculoskeletal:        General: Swelling present. Normal range of motion.     Cervical back: Normal range of motion and neck supple.     Comments: Right knee tender and swollen.  Warm to touch  Skin:    General: Skin is warm and dry.     Findings: No erythema or rash.     Comments: Mediport+  Neurological:     General: No focal deficit present.     Mental Status: He is alert and oriented to person, place, and time.     Cranial Nerves: No cranial nerve deficit.     Coordination: Coordination normal.  Psychiatric:        Mood and Affect: Mood normal.        Behavior: Behavior normal.        Thought Content:  Thought content normal.     LABORATORY DATA:  I have reviewed the data as listed Lab Results  Component Value Date   WBC 3.7 (L) 10/10/2019   HGB 8.8 (L) 10/10/2019   HCT 29.9 (L) 10/10/2019   MCV 95.8 10/10/2019   PLT 389 10/10/2019   Recent Labs    09/26/19 0829 10/03/19 0827 10/10/19 0819  NA 138 140 140  K 4.0 3.9 3.6  CL 106 107 109  CO2 '22 24 23  '$ GLUCOSE 172* 106* 142*  BUN '19 15 17  '$ CREATININE 0.96 0.97 0.87  CALCIUM 9.4 9.3 9.1  GFRNONAA >60 >60 >60  GFRAA >60 >60 >60  PROT 7.0 6.9 7.0  ALBUMIN 3.1* 3.4* 3.3*  AST '30 19 25  '$ ALT '28 18 26  '$ ALKPHOS 49 54 51  BILITOT 0.5 0.7 0.5   Iron/TIBC/Ferritin/ %Sat    Component Value Date/Time   IRON 119 04/27/2019 0940   TIBC 363 04/27/2019 0940   FERRITIN 58 04/27/2019 0940   IRONPCTSAT 33 04/27/2019 0940      RADIOGRAPHIC STUDIES: I have personally reviewed the radiological images as listed and agreed with the findings in the report. DG Knee 1-2 Views Right  Result Date: 10/15/2019 CLINICAL DATA:  Lateral knee pain and swelling with difficulty walking. No known injury. History of colon cancer. EXAM: RIGHT KNEE - 1-2 VIEW COMPARISON:  None. FINDINGS: The mineralization and alignment are normal. There is no evidence of acute fracture or dislocation. The joint spaces are relatively preserved. There is fragmentation of the patella superolaterally consistent with a bipartite patella (normal variant). There is a moderate size knee joint effusion. Femoropopliteal atherosclerosis is noted with vascular clips medially in the lower leg. IMPRESSION: 1. No acute osseous findings.  2. Moderate size knee joint effusion. 3. Bipartite patella. Electronically Signed   By: Richardean Sale M.D.   On: 10/15/2019 14:31   CT Chest W Contrast  Result Date: 09/10/2019 CLINICAL DATA:  Restaging metastatic pancreatic cancer. EXAM: CT CHEST, ABDOMEN, AND PELVIS WITH CONTRAST TECHNIQUE: Multidetector CT imaging of the chest, abdomen and pelvis  was performed following the standard protocol during bolus administration of intravenous contrast. CONTRAST:  165m OMNIPAQUE IOHEXOL 300 MG/ML  SOLN COMPARISON:  PET-CT 06/04/2019 FINDINGS: CT CHEST FINDINGS Cardiovascular: The heart is normal in size. No pericardial effusion. Stable tortuosity and advanced atherosclerotic calcifications involving the thoracic aorta. 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 Stable advanced three-vessel coronary artery calcifications. Normal appearance of the pulmonary arteries. Mediastinum/Nodes: Stable small scattered mediastinal and hilar lymph nodes. 7.5 mm right paratracheal node on image 11/504. 6 mm right paratracheal node on image 18/504. 5 mm prevascular lymph node on image 18/504. 6.5 mm subcarinal lymph node on image 26/504. The esophagus is grossly normal. Stable small scattered thyroid nodules. Lungs/Pleura: Stable emphysematous changes. No acute pulmonary findings. A few tiny scattered subpleural pulmonary nodules are stable and likely benign. Musculoskeletal: No chest wall masses are identified. Right-sided Port-A-Cath is stable. No supraclavicular or axillary adenopathy. No worrisome bone lesions to suggest metastatic disease. CT ABDOMEN PELVIS FINDINGS Hepatobiliary: No hepatic lesions are identified. The small hepatic metastatic lesions have resolved. No new lesions. The portal and hepatic veins are patent. The gallbladder is surgically absent. No common bile duct dilatation. Pancreas: Ill-defined lesion in the lower pancreatic head/uncinate process measures approximately 3 x 3 cm and appears relatively stable. The pancreatic body and tail appear normal. Spleen: Normal size.  No focal lesions. Adrenals/Urinary Tract: The adrenal glands and kidneys are unremarkable. Stable renal cysts. The bladder is unremarkable. Stomach/Bowel: The stomach, duodenum, small bowel and colon are grossly normal. No acute inflammatory changes, mass lesions or  obstructive findings. The terminal ileum and appendix are normal. Vascular/Lymphatic: Stable advanced atherosclerotic calcifications involving the aorta, iliac arteries and branch vessels. No aneurysm or dissection. Marked interval reduction in size of the hypermetabolic upper abdominal lymph nodes. 7 mm node between the portal vein and IVC on image 56/504 previously measured 14.5 mm. 5.5 mm interaortocaval lymph node on image 65/504 previously measured 10.5 cm. Reproductive: Enlarged prostate gland with median lobe hypertrophy impressing on the base of the bladder. The seminal vesicles appear normal. Other: Small amount of free pelvic fluid is noted. No inguinal mass or adenopathy. 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 Musculoskeletal: No significant bony findings. Stable lumbar fusion hardware. IMPRESSION: 1. CT findings suggest a good response to treatment. No discernible residual hepatic metastatic lesions are identified. 2. Significant interval reduction in size of the abdominal lymph nodes. The pancreatic lesions difficult to measure exactly but appears relatively stable in size. 3. Stable small scattered mediastinal and hilar lymph nodes. 4. Stable small scattered subpleural pulmonary nodules, likely benign. 5. 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 Electronically Signed   By: PMarijo SanesM.D.   On: 09/10/2019 10:01   CT Abdomen Pelvis W Contrast  Result Date: 09/10/2019 CLINICAL DATA:  Restaging metastatic pancreatic cancer. EXAM: CT CHEST, ABDOMEN, AND PELVIS WITH CONTRAST TECHNIQUE: Multidetector CT imaging of the chest, abdomen and  pelvis was performed following the standard protocol during bolus administration of intravenous contrast. CONTRAST:  140m OMNIPAQUE IOHEXOL 300  MG/ML  SOLN COMPARISON:  PET-CT 06/04/2019 FINDINGS: CT CHEST FINDINGS Cardiovascular: The heart is normal in size. No pericardial effusion. Stable tortuosity and advanced atherosclerotic calcifications involving the thoracic aorta. 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 Stable advanced three-vessel coronary artery calcifications. Normal appearance of the pulmonary arteries. Mediastinum/Nodes: Stable small scattered mediastinal and hilar lymph nodes. 7.5 mm right paratracheal node on image 11/504. 6 mm right paratracheal node on image 18/504. 5 mm prevascular lymph node on image 18/504. 6.5 mm subcarinal lymph node on image 26/504. The esophagus is grossly normal. Stable small scattered thyroid nodules. Lungs/Pleura: Stable emphysematous changes. No acute pulmonary findings. A few tiny scattered subpleural pulmonary nodules are stable and likely benign. Musculoskeletal: No chest wall masses are identified. Right-sided Port-A-Cath is stable. No supraclavicular or axillary adenopathy. No worrisome bone lesions to suggest metastatic disease. CT ABDOMEN PELVIS FINDINGS Hepatobiliary: No hepatic lesions are identified. The small hepatic metastatic lesions have resolved. No new lesions. The portal and hepatic veins are patent. The gallbladder is surgically absent. No common bile duct dilatation. Pancreas: Ill-defined lesion in the lower pancreatic head/uncinate process measures approximately 3 x 3 cm and appears relatively stable. The pancreatic body and tail appear normal. Spleen: Normal size.  No focal lesions. Adrenals/Urinary Tract: The adrenal glands and kidneys are unremarkable. Stable renal cysts. The bladder is unremarkable. Stomach/Bowel: The stomach, duodenum, small bowel and colon are grossly normal. No acute inflammatory changes, mass lesions or obstructive findings. The terminal ileum and appendix are normal. Vascular/Lymphatic: Stable advanced atherosclerotic calcifications involving  the aorta, iliac arteries and branch vessels. No aneurysm or dissection. Marked interval reduction in size of the hypermetabolic upper abdominal lymph nodes. 7 mm node between the portal vein and IVC on image 56/504 previously measured 14.5 mm. 5.5 mm interaortocaval lymph node on image 65/504 previously measured 10.5 cm. Reproductive: Enlarged prostate gland with median lobe hypertrophy impressing on the base of the bladder. The seminal vesicles appear normal. Other: Small amount of free pelvic fluid is noted. No inguinal mass or adenopathy. 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 Musculoskeletal: No significant bony findings. Stable lumbar fusion hardware. IMPRESSION: 1. CT findings suggest a good response to treatment. No discernible residual hepatic metastatic lesions are identified. 2. Significant interval reduction in size of the abdominal lymph nodes. The pancreatic lesions difficult to measure exactly but appears relatively stable in size. 3. Stable small scattered mediastinal and hilar lymph nodes. 4. Stable small scattered subpleural pulmonary nodules, likely benign. 5. 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 Electronically Signed   By: PMarijo SanesM.D.   On: 09/10/2019 10:01      ASSESSMENT & PLAN:  1. Acute pain of right knee   2. Pancreatic cancer metastasized to liver ([facility]   3. Effusion of right knee    #Acute right knee pain, X-ray right knee was obtained. I discussed with radiologist regarding the x-ray findings Patient has no acute fracture or acute osseous findings.  Moderate sized knee joint effusion.  Bipartite patella Etiology of knee joint effusion is unknown, ? infectious.  I recommend patient to check blood work today including CBC CMP and a  uric acid.  He  declined.  He has a follow-up appointment with me later this week and will do blood work at that time. Knee pain, I recommend patient to take ibuprofen 800 mg 3 times a day.  We will further titrate pain regimen with his symptoms. Refer to orthopedic surgeon for evaluation.   #Metastatic pancreatic cancer- liver mets, hilar nodal metastasis, ?bone mets, He will keep his previously scheduled appointment with me.  All questions were answered. The patient knows to call the clinic with any problems questions or concerns.   Earlie Server, MD, PhD Hematology Oncology [place] [facility] 10/16/2019

## 2019-10-16 NOTE — Telephone Encounter (Signed)
[facility] faxed refill request for the following medications:  valsartan (DIOVAN) 160 MG tablet   Please advise.  

## 2019-10-17 ENCOUNTER — Other Ambulatory Visit: Payer: Self-pay

## 2019-10-17 ENCOUNTER — Inpatient Hospital Stay: Payer: Medicare Other

## 2019-10-17 DIAGNOSIS — Z95828 Presence of other vascular implants and grafts: Secondary | ICD-10-CM

## 2019-10-17 DIAGNOSIS — M25561 Pain in right knee: Secondary | ICD-10-CM

## 2019-10-17 DIAGNOSIS — Z5111 Encounter for antineoplastic chemotherapy: Secondary | ICD-10-CM | POA: Diagnosis not present

## 2019-10-17 DIAGNOSIS — C259 Malignant neoplasm of pancreas, unspecified: Secondary | ICD-10-CM

## 2019-10-17 LAB — CBC WITH DIFFERENTIAL/PLATELET
Abs Immature Granulocytes: 0.12 10*3/uL — ABNORMAL HIGH (ref 0.00–0.07)
Basophils Absolute: 0.1 10*3/uL (ref 0.0–0.1)
Basophils Relative: 1 %
Eosinophils Absolute: 0 10*3/uL (ref 0.0–0.5)
Eosinophils Relative: 1 %
HCT: 31 % — ABNORMAL LOW (ref 39.0–52.0)
Hemoglobin: 9 g/dL — ABNORMAL LOW (ref 13.0–17.0)
Immature Granulocytes: 3 %
Lymphocytes Relative: 15 %
Lymphs Abs: 0.7 10*3/uL (ref 0.7–4.0)
MCH: 27.8 pg (ref 26.0–34.0)
MCHC: 29 g/dL — ABNORMAL LOW (ref 30.0–36.0)
MCV: 95.7 fL (ref 80.0–100.0)
Monocytes Absolute: 0.8 10*3/uL (ref 0.1–1.0)
Monocytes Relative: 18 %
Neutro Abs: 2.8 10*3/uL (ref 1.7–7.7)
Neutrophils Relative %: 62 %
Platelets: 205 10*3/uL (ref 150–400)
RBC: 3.24 MIL/uL — ABNORMAL LOW (ref 4.22–5.81)
RDW: 18.2 % — ABNORMAL HIGH (ref 11.5–15.5)
WBC: 4.5 10*3/uL (ref 4.0–10.5)
nRBC: 0 % (ref 0.0–0.2)

## 2019-10-17 LAB — COMPREHENSIVE METABOLIC PANEL
ALT: 29 U/L (ref 0–44)
AST: 25 U/L (ref 15–41)
Albumin: 3.4 g/dL — ABNORMAL LOW (ref 3.5–5.0)
Alkaline Phosphatase: 58 U/L (ref 38–126)
Anion gap: 9 (ref 5–15)
BUN: 21 mg/dL (ref 8–23)
CO2: 22 mmol/L (ref 22–32)
Calcium: 9.4 mg/dL (ref 8.9–10.3)
Chloride: 109 mmol/L (ref 98–111)
Creatinine, Ser: 1.02 mg/dL (ref 0.61–1.24)
GFR calc Af Amer: >60 mL/min (ref >60)
GFR calc non Af Amer: >60 mL/min (ref >60)
Glucose, Bld: 86 mg/dL (ref 70–99)
Potassium: 4.3 mmol/L (ref 3.5–5.1)
Sodium: 140 mmol/L (ref 135–145)
Total Bilirubin: 0.5 mg/dL (ref 0.3–1.2)
Total Protein: 7.3 g/dL (ref 6.5–8.1)

## 2019-10-17 LAB — SAMPLE TO BLOOD BANK

## 2019-10-17 LAB — URIC ACID: Uric Acid, Serum: 5.4 mg/dL (ref 3.7–8.6)

## 2019-10-17 MED ORDER — HEPARIN SOD (PORK) LOCK FLUSH 100 UNIT/ML IV SOLN
500.0000 [IU] | Freq: Once | INTRAVENOUS | Status: AC
  Administered 2019-10-17: 500 [IU] via INTRAVENOUS
  Filled 2019-10-17: qty 5

## 2019-10-17 MED ORDER — HEPARIN SOD (PORK) LOCK FLUSH 100 UNIT/ML IV SOLN
INTRAVENOUS | Status: AC
  Filled 2019-10-17: qty 5

## 2019-10-17 MED ORDER — SODIUM CHLORIDE 0.9% FLUSH
10.0000 mL | Freq: Once | INTRAVENOUS | Status: AC
  Filled 2019-10-17: qty 10

## 2019-10-18 LAB — CANCER ANTIGEN 19-9: CA 19-9: 358 U/mL — ABNORMAL HIGH (ref 0–35)

## 2019-10-19 ENCOUNTER — Inpatient Hospital Stay (HOSPITAL_BASED_OUTPATIENT_CLINIC_OR_DEPARTMENT_OTHER): Payer: Medicare Other | Admitting: Hospice and Palliative Medicine

## 2019-10-19 DIAGNOSIS — Z515 Encounter for palliative care: Secondary | ICD-10-CM | POA: Diagnosis not present

## 2019-10-19 DIAGNOSIS — C259 Malignant neoplasm of pancreas, unspecified: Secondary | ICD-10-CM

## 2019-10-19 DIAGNOSIS — C787 Secondary malignant neoplasm of liver and intrahepatic bile duct: Secondary | ICD-10-CM | POA: Diagnosis not present

## 2019-10-19 NOTE — Progress Notes (Signed)
Virtual Visit via Telephone Note  I connected with Craig [name] on 10/19/19 at  2:00 PM EST by telephone and verified that I am speaking with the correct person using two identifiers.   I discussed the limitations, risks, security and privacy concerns of performing an evaluation and management service by telephone and the availability of in person appointments. I also discussed with the patient that there may be a patient responsible charge related to this service. The patient expressed understanding and agreed to proceed.   History of Present Illness: [name] is a 74 y.o. 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  Patient is being initiated on chemotherapy with gemcitabine and Abraxane.  He was referred to palliative care to help address goals and manage ongoing symptoms.   Observations/Objective: I called and spoke with patient by phone.  He reports that he is doing reasonably well.  He denies significant changes or concerns.  He was evaluated recently in the clinic for left knee pain and was found to have an effusion on x-ray.  Patient says he subsequently had that "drawn off" and feels much better.  He reports stable oral intake and performance status.  No other distressing symptoms reported.  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  Patient is currently on single agent gemcitabine.  Tumor markers are being followed.  Follow Up Instructions: Follow-up telephone visit in 1 month   I discussed the assessment and treatment plan with the patient. The patient was provided an opportunity to ask questions and all were answered. The patient agreed with the plan and demonstrated an understanding of the instructions.   The patient was advised to call back or seek an in-person  evaluation if the symptoms worsen or if the condition fails to improve as anticipated.  I provided 5 minutes of non-face-to-face time during this encounter.   Irean Hong, NP

## 2019-10-20 ENCOUNTER — Other Ambulatory Visit: Payer: Self-pay | Admitting: Physician Assistant

## 2019-10-20 DIAGNOSIS — I10 Essential (primary) hypertension: Secondary | ICD-10-CM

## 2019-10-22 ENCOUNTER — Other Ambulatory Visit: Payer: Self-pay

## 2019-10-22 ENCOUNTER — Inpatient Hospital Stay: Payer: Medicare Other

## 2019-10-22 NOTE — Progress Notes (Signed)
Nutrition  Patient scheduled for nutrition phone follow-up visit.  No answer on mobile number or option to leave voicemail.  Called home number and no answer but able to leave voicemail.    Paige Vanderwoude B. Zenia Resides, [place], [place] Registered Dietitian [phone] (pager)

## 2019-10-23 NOTE — Progress Notes (Signed)
Patient is coming in for follow up he is doing well no complaints  

## 2019-10-24 ENCOUNTER — Inpatient Hospital Stay: Payer: Medicare Other

## 2019-10-24 ENCOUNTER — Other Ambulatory Visit: Payer: Self-pay

## 2019-10-24 ENCOUNTER — Encounter: Payer: Self-pay | Admitting: Oncology

## 2019-10-24 ENCOUNTER — Inpatient Hospital Stay (HOSPITAL_BASED_OUTPATIENT_CLINIC_OR_DEPARTMENT_OTHER): Payer: Medicare Other | Admitting: Oncology

## 2019-10-24 VITALS — BP 150/80 | HR 73 | Temp 98.7°F | Resp 18 | Wt 180.6 lb

## 2019-10-24 VITALS — BP 150/80 | HR 73 | Temp 98.7°F | Resp 18

## 2019-10-24 DIAGNOSIS — C259 Malignant neoplasm of pancreas, unspecified: Secondary | ICD-10-CM

## 2019-10-24 DIAGNOSIS — Z5111 Encounter for antineoplastic chemotherapy: Secondary | ICD-10-CM

## 2019-10-24 DIAGNOSIS — G629 Polyneuropathy, unspecified: Secondary | ICD-10-CM

## 2019-10-24 DIAGNOSIS — D6481 Anemia due to antineoplastic chemotherapy: Secondary | ICD-10-CM

## 2019-10-24 DIAGNOSIS — M25461 Effusion, right knee: Secondary | ICD-10-CM

## 2019-10-24 DIAGNOSIS — C787 Secondary malignant neoplasm of liver and intrahepatic bile duct: Secondary | ICD-10-CM

## 2019-10-24 DIAGNOSIS — T451X5A Adverse effect of antineoplastic and immunosuppressive drugs, initial encounter: Secondary | ICD-10-CM

## 2019-10-24 LAB — CBC WITH DIFFERENTIAL/PLATELET
Abs Immature Granulocytes: 0.13 10*3/uL — ABNORMAL HIGH (ref 0.00–0.07)
Basophils Absolute: 0.1 10*3/uL (ref 0.0–0.1)
Basophils Relative: 2 %
Eosinophils Absolute: 0.2 10*3/uL (ref 0.0–0.5)
Eosinophils Relative: 3 %
HCT: 31.7 % — ABNORMAL LOW (ref 39.0–52.0)
Hemoglobin: 9.5 g/dL — ABNORMAL LOW (ref 13.0–17.0)
Immature Granulocytes: 2 %
Lymphocytes Relative: 16 %
Lymphs Abs: 1 10*3/uL (ref 0.7–4.0)
MCH: 29 pg (ref 26.0–34.0)
MCHC: 30 g/dL (ref 30.0–36.0)
MCV: 96.6 fL (ref 80.0–100.0)
Monocytes Absolute: 0.8 10*3/uL (ref 0.1–1.0)
Monocytes Relative: 13 %
Neutro Abs: 3.9 10*3/uL (ref 1.7–7.7)
Neutrophils Relative %: 64 %
Platelets: 424 10*3/uL — ABNORMAL HIGH (ref 150–400)
RBC: 3.28 MIL/uL — ABNORMAL LOW (ref 4.22–5.81)
RDW: 19.3 % — ABNORMAL HIGH (ref 11.5–15.5)
WBC: 6.2 10*3/uL (ref 4.0–10.5)
nRBC: 0 % (ref 0.0–0.2)

## 2019-10-24 LAB — COMPREHENSIVE METABOLIC PANEL
ALT: 20 U/L (ref 0–44)
AST: 17 U/L (ref 15–41)
Albumin: 3.4 g/dL — ABNORMAL LOW (ref 3.5–5.0)
Alkaline Phosphatase: 55 U/L (ref 38–126)
Anion gap: 8 (ref 5–15)
BUN: 20 mg/dL (ref 8–23)
CO2: 23 mmol/L (ref 22–32)
Calcium: 9.2 mg/dL (ref 8.9–10.3)
Chloride: 107 mmol/L (ref 98–111)
Creatinine, Ser: 0.86 mg/dL (ref 0.61–1.24)
GFR calc Af Amer: >60 mL/min (ref >60)
GFR calc non Af Amer: >60 mL/min (ref >60)
Glucose, Bld: 117 mg/dL — ABNORMAL HIGH (ref 70–99)
Potassium: 4 mmol/L (ref 3.5–5.1)
Sodium: 138 mmol/L (ref 135–145)
Total Bilirubin: 0.5 mg/dL (ref 0.3–1.2)
Total Protein: 7.1 g/dL (ref 6.5–8.1)

## 2019-10-24 MED ORDER — HEPARIN SOD (PORK) LOCK FLUSH 100 UNIT/ML IV SOLN
500.0000 [IU] | Freq: Once | INTRAVENOUS | Status: AC
  Administered 2019-10-24: 11:00:00 500 [IU] via INTRAVENOUS
  Filled 2019-10-24: qty 5

## 2019-10-24 MED ORDER — HEPARIN SOD (PORK) LOCK FLUSH 100 UNIT/ML IV SOLN
INTRAVENOUS | Status: AC
  Filled 2019-10-24: qty 5

## 2019-10-24 MED ORDER — PROCHLORPERAZINE MALEATE 10 MG PO TABS
10.0000 mg | ORAL_TABLET | Freq: Once | ORAL | Status: AC
  Administered 2019-10-24: 10 mg via ORAL
  Filled 2019-10-24: qty 1

## 2019-10-24 MED ORDER — SODIUM CHLORIDE 0.9 % IV SOLN
2000.0000 mg | Freq: Once | INTRAVENOUS | Status: AC
  Administered 2019-10-24: 10:00:00 2000 mg via INTRAVENOUS
  Filled 2019-10-24: qty 52.6

## 2019-10-24 MED ORDER — SODIUM CHLORIDE 0.9 % IV SOLN
Freq: Once | INTRAVENOUS | Status: AC
  Filled 2019-10-24: qty 250

## 2019-10-24 MED ORDER — SODIUM CHLORIDE 0.9% FLUSH
10.0000 mL | Freq: Once | INTRAVENOUS | Status: AC
  Administered 2019-10-24: 08:00:00 10 mL via INTRAVENOUS
  Filled 2019-10-24: qty 10

## 2019-10-24 NOTE — Progress Notes (Signed)
Pt here for follow up. Pt reports soreness to right knee, but it is doing a little better. Pt states he had fluid drained from right knee 1 time.

## 2019-10-24 NOTE — Progress Notes (Signed)
Hematology/Oncology  Follow up note [facility] [phone] [phone]   Patient Care Team: Paulene Floor as PCP - General (Physician Assistant) Clent Jacks, RN as Oncology Nurse Navigator  REFERRING PROVIDER: Trinna Post, PA-C  CHIEF COMPLAINTS/REASON FOR VISIT:  Follow up for treatment of pancreatic cancer  HISTORY OF PRESENTING ILLNESS:   Craig [name] is a  74 y.o.  male with PMH listed below, including CABG x5, PVD, hypertension, former tobacco abuse, former alcohol abuse, iron deficiency anemia, was seen in consultation at the request of  Terrilee Croak, Adriana M, PA-C  for evaluation of abnormal CT Patient recently presented to primary care provider complaining for abdominal pain radiating to his back for about 10 days.  Patient uses Tylenol as needed for pain. Work-up showed elevated amylase, lipase, mild anemia with hemoglobin of 11.3 Acute hepatitis panel negative. 05/16/2019 CT abdomen pelvis with contrast showed poorly marginated heterogeneous hypodense 2.9 cm pancreatic mass at the uncinate process, worrisome for primary pancreatic cancer.  No biliary or pancreatic duct dilation. Several ill defined hypodense liver masses scattered throughout the liver, largest 3.1 cm in the segment 6 right liver lobe. Nonspecific portacaval and aortocaval adenopathy. Extreme medial left lung base 4.4 cm pleural-based mass probably a pleural metastasis.  Additional tiny pulmonary nodules scattered at the right lung base are indeterminate. Mild prostate or megaly  Patient was referred to heme-onc for further evaluation. Patient reports that his abdominal pain was 10 out of 10 a few days ago, he uses Tylenol as needed. Today his pain is getting better, 2 out of 10.  Denies any nausea, vomiting, diarrhea, fever or chills. Married, lives with wife.  Appetite is good.  He weighs 195 pounds today at the clinic. His weight was 204 pounds  back in May 2020.  He had a history of 37.5-pack-year smoking history quitting 2018. No longer drinking alcohol.  # ultrasound-guided liver biopsy on 05/21/2019.  Pathology showed fragments of benign hepatic parenchyma showing minimal macro vascular steatosis, otherwise no significant histo pathologic change. Single core of renal tissue showing approximately 25% glomerulosclerosis  # #History of CAD, status post CABG x5.  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  # Omniseq test showed PD-L1 50% TPS, TMB high, negative for BRCA1/2 or NTRK fusion.  MSI could not be completed.   INTERVAL HISTORY Craig [name] is a 74 y.o. male who has above history reviewed by me today presents for evaluation prior to chemotherapy for metastatic pancreatic cancer.   Problems and complaints are listed below: Patient has been on palliative chemotherapy with single agent Abraxane for treatment of pancreatic cancer.   Patient reports feeling well today. He was seen by orthopedic surgeon for swollen right knee and had knee effusion aspirated.  He reports the pain is better.  Patient takes ibuprofen 800 mg daily as needed for residual discomfort. 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  He takes gabapentin. Denies any nausea, vomiting, fever, chills, appetite is fair. . . Review of Systems  Constitutional: Positive for fatigue. Negative for appetite change, chills, fever and unexpected weight change.  HENT:   Negative for hearing loss and voice change.   Eyes: Negative for eye problems and icterus.  Respiratory: Negative for chest tightness, cough and shortness of breath.   Cardiovascular: Negative for chest pain and leg swelling.  Gastrointestinal: Negative  for abdominal distention and abdominal pain.  Endocrine: Negative for hot flashes.    Genitourinary: Negative for difficulty urinating, dysuria and frequency.   Musculoskeletal: Positive for arthralgias.       Right knee status post aspiration.  Denies any cough.  Skin: Negative for itching and rash.  Neurological: Positive for numbness. Negative for light-headedness.  Hematological: Negative for adenopathy. Does not bruise/bleed easily.  Psychiatric/Behavioral: Negative for confusion.    MEDICAL HISTORY:  Past Medical History:  Diagnosis Date  . Allergic rhinitis   . Cancer [facility])    new dx Pancreatic Cancer - Aug 2020  . CHF (congestive heart failure) ([place])   . Chronic cholecystitis   . Coronary artery disease   . DDD (degenerative disc disease), cervical   . Dehydration 06/21/2019  . Dyspnea   . Early cataract   . Erectile dysfunction   . GERD (gastroesophageal reflux disease)   . Gouty arthritis   . Headache    occasional migraines  . Hypercalcemia   . Hyperlipemia   . Hypertension   . Palpitations   . Peripheral vascular disease ([place])   . 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   LIMA to LAD SVG to [place] SVG SEQUENTIALLY to OM1 and OM2 SVG to ACUTE MARGINAL  . Spinal stenosis of cervical region   . Vitamin D deficiency     SURGICAL HISTORY: Past Surgical History:  Procedure Laterality Date  . BACK SURGERY  2018    fusion with screws  . CHOLECYSTECTOMY N/A 11/13/2018   Procedure: LAPAROSCOPIC CHOLECYSTECTOMY;  Surgeon: Vickie Epley, MD;  Location: [facility] ORS;  Service: General;  Laterality: N/A;  . COLONOSCOPY    . CORONARY ARTERY BYPASS GRAFT N/A 09/30/2017   Procedure: CORONARY ARTERY BYPASS GRAFTING times five using right and left Saphaneous vein harvested endoscopicly  and left internal mammary artery. (CABG),TEE;  Surgeon: Rexene Alberts, MD;  Location: [place];  Service: Open Heart Surgery;  Laterality: N/A;  . LEFT HEART CATH AND CORONARY ANGIOGRAPHY Left 09/06/2017   Procedure: LEFT HEART CATH AND CORONARY ANGIOGRAPHY;  Surgeon: Corey Skains, MD;  Location: [place] CV LAB;  Service: Cardiovascular;  Laterality: Left;  . percutaneous transluminal balloon angioplasty  01/2010   of left lower extremity  . PORTA CATH INSERTION N/A 06/06/2019   Procedure: PORTA CATH INSERTION;  Surgeon: Katha Cabal, MD;  Location: [place] CV [facility];  Service: Cardiovascular;  Laterality: N/A;  . TEE WITHOUT CARDIOVERSION N/A 09/30/2017   Procedure: TRANSESOPHAGEAL ECHOCARDIOGRAM (TEE);  Surgeon: Rexene Alberts, MD;  Location: [place];  Service: Open Heart Surgery;  Laterality: N/A;  . VASCULAR SURGERY Left 2010   left exernal iliac and superficial femoral artery PTCA and stenting    SOCIAL HISTORY: Social History   Socioeconomic History  . Marital status: Married    Spouse name: Enid Derry  . Number of children: 2  . Years of education: Not on file  . Highest education level: Not on file  Occupational History  . Occupation: drove tractors    Comment: retired  Tobacco Use  . Smoking status: Former Smoker    Packs/day: 0.75    Years: 50.00    Pack years: 37.50    Types: Cigarettes    Quit date: 09/27/2017    Years since quitting: 2.0  . Smokeless tobacco: Former Systems developer    Types: Chew  Substance and Sexual Activity  . Alcohol use: Not Currently    Comment: stopped drinking before cabg  .  Drug use: Not Currently    Types: Marijuana    Comment: stopped smoking before CABG  . Sexual activity: Not Currently  Other Topics Concern  . Not on file  Social History Narrative  . Not on file   Social Determinants of Health   Financial Resource Strain: Low Risk   . Difficulty of Paying Living Expenses: Not hard at all  Food Insecurity: No Food Insecurity  . Worried About Charity fundraiser in the Last Year: Never true  . Ran Out of Food in the Last Year: Never true  Transportation Needs: No Transportation Needs  . Lack of Transportation (Medical): No  . Lack of Transportation (Non-Medical): No  Physical Activity:  Sufficiently Active  . Days of Exercise per Week: 5 days  . Minutes of Exercise per Session: 150+ min  Stress: No Stress Concern Present  . Feeling of Stress : Not at all  Social Connections: Somewhat Isolated  . Frequency of Communication with Friends and Family: More than three times a week  . Frequency of Social Gatherings with Friends and Family: More than three times a week  . Attends Religious Services: Never  . Active Member of Clubs or Organizations: No  . Attends Archivist Meetings: Never  . Marital Status: Married  Human resources officer Violence:   . Fear of Current or Ex-Partner: Not on file  . Emotionally Abused: Not on file  . Physically Abused: Not on file  . Sexually Abused: Not on file    FAMILY HISTORY: Family History  Problem Relation Age of Onset  . Brain cancer Mother   . Emphysema Father   . Cancer Brother     ALLERGIES:  is allergic to lipitor [atorvastatin]; zetia [ezetimibe]; lisinopril; protonix [pantoprazole]; and spironolactone.  MEDICATIONS:  Current Outpatient Medications  Medication Sig Dispense Refill  . aspirin EC 81 MG tablet Take 81 mg by mouth daily.    . carvedilol (COREG) 6.25 MG tablet Take 6.25 mg by mouth 2 (two) times daily with a meal.  3  . diltiazem (CARDIZEM CD) 300 MG 24 hr capsule TAKE 1 CAPSULE BY MOUTH EVERY DAY 90 capsule 1  . dorzolamide-timolol (COSOPT) 22.3-6.8 MG/ML ophthalmic solution INSTILL ONE DROP INTO BOTH EYES TWICE DAILY    . ferrous sulfate 325 (65 FE) MG tablet TAKE 1 TABLET BY MOUTH EVERY DAY WITH BREAKFAST 90 tablet 1  . gabapentin (NEURONTIN) 300 MG capsule TAKE 2 CAPSULES (600 MG TOTAL) BY MOUTH 3 (THREE) TIMES DAILY. 540 capsule 0  . ibuprofen (ADVIL) 800 MG tablet Take 1 tablet (800 mg total) by mouth every 8 (eight) hours as needed. 30 tablet 0  . latanoprost (XALATAN) 0.005 % ophthalmic solution Place 1 drop into both eyes at bedtime.   3  . lidocaine-prilocaine (EMLA) cream APPLY TO AFFECTED AREA  ONCE AS DIRECTED    . Multiple Vitamin (MULTIVITAMIN WITH MINERALS) TABS tablet Take 1 tablet by mouth daily.    . OMEGA-3 FATTY ACIDS PO Take 1,200 mg by mouth daily.     [name] Kitchen omeprazole (PRILOSEC) 40 MG capsule TAKE 1 CAPSULE BY MOUTH EVERY DAY 90 capsule 0   No current facility-administered medications for this visit.   Facility-Administered Medications Ordered in Other Visits  Medication Dose Route Frequency Provider Last Rate Last Admin  . sodium chloride flush (NS) 0.9 % injection 10 mL  10 mL Intracatheter PRN Earlie Server, MD      . sodium chloride flush (NS) 0.9 % injection 10 mL  10 mL Intravenous Once Earlie Server, MD         PHYSICAL EXAMINATION: ECOG PERFORMANCE STATUS: 1 - Symptomatic but completely ambulatory Vitals:   10/23/19 0932  BP: (!) 150/80  Pulse: 73  Resp: 18  Temp: 98.7 F (37.1 C)  SpO2: 100%   Filed Weights   10/23/19 0932  Weight: 180 lb 9.6 oz (81.9 kg)    Physical Exam Constitutional:      General: He is not in acute distress.    Comments: Patient walks independently  HENT:     Head: Normocephalic and atraumatic.  Eyes:     General: No scleral icterus.    Pupils: Pupils are equal, round, and reactive to light.  Cardiovascular:     Rate and Rhythm: Normal rate and regular rhythm.     Heart sounds: Normal heart sounds.  Pulmonary:     Effort: Pulmonary effort is normal. No respiratory distress.     Breath sounds: No wheezing.  Abdominal:     General: Bowel sounds are normal. There is no distension.     Palpations: Abdomen is soft. There is no mass.     Tenderness: There is no abdominal tenderness.  Musculoskeletal:        General: Swelling present. Normal range of motion.     Cervical back: Normal range of motion and neck supple.     Comments: Right knee swelling has improved.   Skin:    General: Skin is warm and dry.     Findings: No erythema or rash.     Comments: Mediport+  Neurological:     General: No focal deficit present.     Mental  Status: He is alert and oriented to person, place, and time.     Cranial Nerves: No cranial nerve deficit.     Coordination: Coordination normal.  Psychiatric:        Mood and Affect: Mood normal.        Behavior: Behavior normal.        Thought Content: Thought content normal.     LABORATORY DATA:  I have reviewed the data as listed Lab Results  Component Value Date   WBC 6.2 10/24/2019   HGB 9.5 (L) 10/24/2019   HCT 31.7 (L) 10/24/2019   MCV 96.6 10/24/2019   PLT 424 (H) 10/24/2019   Recent Labs    10/10/19 0819 10/17/19 0825 10/24/19 0823  NA 140 140 138  K 3.6 4.3 4.0  CL 109 109 107  CO2 '23 22 23  '$ GLUCOSE 142* 86 117*  BUN '17 21 20  '$ CREATININE 0.87 1.02 0.86  CALCIUM 9.1 9.4 9.2  GFRNONAA >60 >60 >60  GFRAA >60 >60 >60  PROT 7.0 7.3 7.1  ALBUMIN 3.3* 3.4* 3.4*  AST '25 25 17  '$ ALT '26 29 20  '$ ALKPHOS 51 58 55  BILITOT 0.5 0.5 0.5   Iron/TIBC/Ferritin/ %Sat    Component Value Date/Time   IRON 119 04/27/2019 0940   TIBC 363 04/27/2019 0940   FERRITIN 58 04/27/2019 0940   IRONPCTSAT 33 04/27/2019 0940      RADIOGRAPHIC STUDIES: I have personally reviewed the radiological images as listed and agreed with the findings in the report. DG Knee 1-2 Views Right  Result Date: 10/15/2019 CLINICAL DATA:  Lateral knee pain and swelling with difficulty walking. No known injury. History of colon cancer. EXAM: RIGHT KNEE - 1-2 VIEW COMPARISON:  None. FINDINGS: The mineralization and alignment are normal. There is no evidence of acute  fracture or dislocation. The joint spaces are relatively preserved. There is fragmentation of the patella superolaterally consistent with a bipartite patella (normal variant). There is a moderate size knee joint effusion. Femoropopliteal atherosclerosis is noted with vascular clips medially in the lower leg. IMPRESSION: 1. No acute osseous findings. 2. Moderate size knee joint effusion. 3. Bipartite patella. Electronically Signed   By: Richardean Sale M.D.   On: 10/15/2019 14:31   CT Chest W Contrast  Result Date: 09/10/2019 CLINICAL DATA:  Restaging metastatic pancreatic cancer. EXAM: CT CHEST, ABDOMEN, AND PELVIS WITH CONTRAST TECHNIQUE: Multidetector CT imaging of the chest, abdomen and pelvis was performed following the standard protocol during bolus administration of intravenous contrast. CONTRAST:  157m OMNIPAQUE IOHEXOL 300 MG/ML  SOLN COMPARISON:  PET-CT 06/04/2019 FINDINGS: CT CHEST FINDINGS Cardiovascular: The heart is normal in size. No pericardial effusion. Stable tortuosity and advanced atherosclerotic calcifications involving the thoracic aorta. 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 Stable advanced three-vessel coronary artery calcifications. Normal appearance of the pulmonary arteries. Mediastinum/Nodes: Stable small scattered mediastinal and hilar lymph nodes. 7.5 mm right paratracheal node on image 11/504. 6 mm right paratracheal node on image 18/504. 5 mm prevascular lymph node on image 18/504. 6.5 mm subcarinal lymph node on image 26/504. The esophagus is grossly normal. Stable small scattered thyroid nodules. Lungs/Pleura: Stable emphysematous changes. No acute pulmonary findings. A few tiny scattered subpleural pulmonary nodules are stable and likely benign. Musculoskeletal: No chest wall masses are identified. Right-sided Port-A-Cath is stable. No supraclavicular or axillary adenopathy. No worrisome bone lesions to suggest metastatic disease. CT ABDOMEN PELVIS FINDINGS Hepatobiliary: No hepatic lesions are identified. The small hepatic metastatic lesions have resolved. No new lesions. The portal and hepatic veins are patent. The gallbladder is surgically absent. No common bile duct dilatation. Pancreas: Ill-defined lesion in the lower pancreatic head/uncinate process measures approximately 3 x 3 cm and appears relatively stable. The pancreatic body and tail appear normal. Spleen: Normal size.  No focal  lesions. Adrenals/Urinary Tract: The adrenal glands and kidneys are unremarkable. Stable renal cysts. The bladder is unremarkable. Stomach/Bowel: The stomach, duodenum, small bowel and colon are grossly normal. No acute inflammatory changes, mass lesions or obstructive findings. The terminal ileum and appendix are normal. Vascular/Lymphatic: Stable advanced atherosclerotic calcifications involving the aorta, iliac arteries and branch vessels. No aneurysm or dissection. Marked interval reduction in size of the hypermetabolic upper abdominal lymph nodes. 7 mm node between the portal vein and IVC on image 56/504 previously measured 14.5 mm. 5.5 mm interaortocaval lymph node on image 65/504 previously measured 10.5 cm. Reproductive: Enlarged prostate gland with median lobe hypertrophy impressing on the base of the bladder. The seminal vesicles appear normal. Other: Small amount of free pelvic fluid is noted. No inguinal mass or adenopathy. 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 Musculoskeletal: No significant bony findings. Stable lumbar fusion hardware. IMPRESSION: 1. CT findings suggest a good response to treatment. No discernible residual hepatic metastatic lesions are identified. 2. Significant interval reduction in size of the abdominal lymph nodes. The pancreatic lesions difficult to measure exactly but appears relatively stable in size. 3. Stable small scattered mediastinal and hilar lymph nodes. 4. Stable small scattered subpleural pulmonary nodules, likely benign. 5. 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  Electronically Signed   By: Marijo Sanes M.D.   On: 09/10/2019 10:01   CT Abdomen Pelvis W Contrast  Result Date: 09/10/2019 CLINICAL DATA:  Restaging metastatic pancreatic cancer.  EXAM: CT CHEST, ABDOMEN, AND PELVIS WITH CONTRAST TECHNIQUE: Multidetector CT imaging of the chest, abdomen and pelvis was performed following the standard protocol during bolus administration of intravenous contrast. CONTRAST:  169m OMNIPAQUE IOHEXOL 300 MG/ML  SOLN COMPARISON:  PET-CT 06/04/2019 FINDINGS: CT CHEST FINDINGS Cardiovascular: The heart is normal in size. No pericardial effusion. Stable tortuosity and advanced atherosclerotic calcifications involving the thoracic aorta. 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 Stable advanced three-vessel coronary artery calcifications. Normal appearance of the pulmonary arteries. Mediastinum/Nodes: Stable small scattered mediastinal and hilar lymph nodes. 7.5 mm right paratracheal node on image 11/504. 6 mm right paratracheal node on image 18/504. 5 mm prevascular lymph node on image 18/504. 6.5 mm subcarinal lymph node on image 26/504. The esophagus is grossly normal. Stable small scattered thyroid nodules. Lungs/Pleura: Stable emphysematous changes. No acute pulmonary findings. A few tiny scattered subpleural pulmonary nodules are stable and likely benign. Musculoskeletal: No chest wall masses are identified. Right-sided Port-A-Cath is stable. No supraclavicular or axillary adenopathy. No worrisome bone lesions to suggest metastatic disease. CT ABDOMEN PELVIS FINDINGS Hepatobiliary: No hepatic lesions are identified. The small hepatic metastatic lesions have resolved. No new lesions. The portal and hepatic veins are patent. The gallbladder is surgically absent. No common bile duct dilatation. Pancreas: Ill-defined lesion in the lower pancreatic head/uncinate process measures approximately 3 x 3 cm and appears relatively stable. The pancreatic body and tail appear normal. Spleen: Normal size.  No focal lesions. Adrenals/Urinary Tract: The adrenal glands and kidneys are unremarkable. Stable renal cysts. The bladder is unremarkable. Stomach/Bowel:  The stomach, duodenum, small bowel and colon are grossly normal. No acute inflammatory changes, mass lesions or obstructive findings. The terminal ileum and appendix are normal. Vascular/Lymphatic: Stable advanced atherosclerotic calcifications involving the aorta, iliac arteries and branch vessels. No aneurysm or dissection. Marked interval reduction in size of the hypermetabolic upper abdominal lymph nodes. 7 mm node between the portal vein and IVC on image 56/504 previously measured 14.5 mm. 5.5 mm interaortocaval lymph node on image 65/504 previously measured 10.5 cm. Reproductive: Enlarged prostate gland with median lobe hypertrophy impressing on the base of the bladder. The seminal vesicles appear normal. Other: Small amount of free pelvic fluid is noted. No inguinal mass or adenopathy. 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 Musculoskeletal: No significant bony findings. Stable lumbar fusion hardware. IMPRESSION: 1. CT findings suggest a good response to treatment. No discernible residual hepatic metastatic lesions are identified. 2. Significant interval reduction in size of the abdominal lymph nodes. The pancreatic lesions difficult to measure exactly but appears relatively stable in size. 3. Stable small scattered mediastinal and hilar lymph nodes. 4. Stable small scattered subpleural pulmonary nodules, likely benign. 5. 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 Electronically Signed   By: PMarijo SanesM.D.   On: 09/10/2019 10:01      ASSESSMENT & PLAN:  1. [place]   2. Effusion of right knee   3. Encounter for antineoplastic chemotherapy   4. Neuropathy   5. Anemia due to antineoplastic chemotherapy     #  Metastatic pancreatic cancer- liver  mets, hilar nodal metastasis, ?bone mets, Labs reviewed and discussed with patient.  Counts acceptable to proceed with gemcitabine 1000 mg/m. Abraxane is currently on hold due to worsening of pre-existing neuropathy. CA 19-9 is gradually trending up.  #Pre-existing neuropathy, worsened on chemotherapy, continue gabapentin 600 mg 3 times daily. #Anemia, multifactorial secondary to chemotherapy and other comorbidities. Hemoglobin 9.5 today at his baseline.  Continue to monitor. #Right knee effusion, status post aspiration.  Follow-up with orthopedic surgeon if needed.  Ibuprofen 800 mg every 8 hours as needed for pain.  All questions were answered. The patient knows to call the clinic with any problems questions or concerns. Follow-up in 1 week for chemotherapy.  Earlie Server, MD, PhD Hematology Oncology [place] at [facility] 10/24/2019

## 2019-10-25 LAB — CANCER ANTIGEN 19-9: CA 19-9: 783 U/mL — ABNORMAL HIGH (ref 0–35)

## 2019-10-30 ENCOUNTER — Other Ambulatory Visit: Payer: Self-pay

## 2019-10-30 NOTE — Progress Notes (Signed)
Patient pre screened for office appointment, no questions or concerns today. Patient reminded of upcoming appointment time and date. 

## 2019-10-31 ENCOUNTER — Inpatient Hospital Stay: Payer: Medicare Other

## 2019-10-31 ENCOUNTER — Other Ambulatory Visit: Payer: Self-pay

## 2019-10-31 ENCOUNTER — Inpatient Hospital Stay (HOSPITAL_BASED_OUTPATIENT_CLINIC_OR_DEPARTMENT_OTHER): Payer: Medicare Other | Admitting: Oncology

## 2019-10-31 ENCOUNTER — Encounter: Payer: Self-pay | Admitting: Oncology

## 2019-10-31 VITALS — BP 168/78 | HR 73 | Resp 20

## 2019-10-31 VITALS — BP 172/90 | HR 73 | Temp 97.7°F | Resp 18 | Wt 180.6 lb

## 2019-10-31 DIAGNOSIS — C787 Secondary malignant neoplasm of liver and intrahepatic bile duct: Secondary | ICD-10-CM

## 2019-10-31 DIAGNOSIS — C259 Malignant neoplasm of pancreas, unspecified: Secondary | ICD-10-CM | POA: Diagnosis not present

## 2019-10-31 DIAGNOSIS — T451X5A Adverse effect of antineoplastic and immunosuppressive drugs, initial encounter: Secondary | ICD-10-CM

## 2019-10-31 DIAGNOSIS — Z95828 Presence of other vascular implants and grafts: Secondary | ICD-10-CM | POA: Diagnosis not present

## 2019-10-31 DIAGNOSIS — G629 Polyneuropathy, unspecified: Secondary | ICD-10-CM

## 2019-10-31 DIAGNOSIS — Z5111 Encounter for antineoplastic chemotherapy: Secondary | ICD-10-CM | POA: Diagnosis not present

## 2019-10-31 DIAGNOSIS — M25461 Effusion, right knee: Secondary | ICD-10-CM | POA: Diagnosis not present

## 2019-10-31 DIAGNOSIS — D6481 Anemia due to antineoplastic chemotherapy: Secondary | ICD-10-CM

## 2019-10-31 LAB — CBC WITH DIFFERENTIAL/PLATELET
Abs Immature Granulocytes: 0.09 10*3/uL — ABNORMAL HIGH (ref 0.00–0.07)
Basophils Absolute: 0 10*3/uL (ref 0.0–0.1)
Basophils Relative: 1 %
Eosinophils Absolute: 0.1 10*3/uL (ref 0.0–0.5)
Eosinophils Relative: 2 %
HCT: 29.2 % — ABNORMAL LOW (ref 39.0–52.0)
Hemoglobin: 8.6 g/dL — ABNORMAL LOW (ref 13.0–17.0)
Immature Granulocytes: 3 %
Lymphocytes Relative: 24 %
Lymphs Abs: 0.8 10*3/uL (ref 0.7–4.0)
MCH: 28.4 pg (ref 26.0–34.0)
MCHC: 29.5 g/dL — ABNORMAL LOW (ref 30.0–36.0)
MCV: 96.4 fL (ref 80.0–100.0)
Monocytes Absolute: 0.6 10*3/uL (ref 0.1–1.0)
Monocytes Relative: 19 %
Neutro Abs: 1.7 10*3/uL (ref 1.7–7.7)
Neutrophils Relative %: 51 %
Platelets: 299 10*3/uL (ref 150–400)
RBC: 3.03 MIL/uL — ABNORMAL LOW (ref 4.22–5.81)
RDW: 18.5 % — ABNORMAL HIGH (ref 11.5–15.5)
WBC: 3.3 10*3/uL — ABNORMAL LOW (ref 4.0–10.5)
nRBC: 0 % (ref 0.0–0.2)

## 2019-10-31 LAB — COMPREHENSIVE METABOLIC PANEL
ALT: 26 U/L (ref 0–44)
AST: 28 U/L (ref 15–41)
Albumin: 3.3 g/dL — ABNORMAL LOW (ref 3.5–5.0)
Alkaline Phosphatase: 55 U/L (ref 38–126)
Anion gap: 9 (ref 5–15)
BUN: 16 mg/dL (ref 8–23)
CO2: 22 mmol/L (ref 22–32)
Calcium: 9.1 mg/dL (ref 8.9–10.3)
Chloride: 109 mmol/L (ref 98–111)
Creatinine, Ser: 0.94 mg/dL (ref 0.61–1.24)
GFR calc Af Amer: >60 mL/min (ref >60)
GFR calc non Af Amer: >60 mL/min (ref >60)
Glucose, Bld: 119 mg/dL — ABNORMAL HIGH (ref 70–99)
Potassium: 3.7 mmol/L (ref 3.5–5.1)
Sodium: 140 mmol/L (ref 135–145)
Total Bilirubin: 0.4 mg/dL (ref 0.3–1.2)
Total Protein: 7 g/dL (ref 6.5–8.1)

## 2019-10-31 MED ORDER — HEPARIN SOD (PORK) LOCK FLUSH 100 UNIT/ML IV SOLN
INTRAVENOUS | Status: AC
  Filled 2019-10-31: qty 5

## 2019-10-31 MED ORDER — PROCHLORPERAZINE MALEATE 10 MG PO TABS
10.0000 mg | ORAL_TABLET | Freq: Once | ORAL | Status: AC
  Administered 2019-10-31: 10 mg via ORAL
  Filled 2019-10-31: qty 1

## 2019-10-31 MED ORDER — SODIUM CHLORIDE 0.9 % IV SOLN
Freq: Once | INTRAVENOUS | Status: AC
  Filled 2019-10-31: qty 250

## 2019-10-31 MED ORDER — HEPARIN SOD (PORK) LOCK FLUSH 100 UNIT/ML IV SOLN
500.0000 [IU] | Freq: Once | INTRAVENOUS | Status: AC | PRN
  Administered 2019-10-31: 500 [IU]
  Filled 2019-10-31: qty 5

## 2019-10-31 MED ORDER — SODIUM CHLORIDE 0.9% FLUSH
10.0000 mL | Freq: Once | INTRAVENOUS | Status: AC
  Administered 2019-10-31: 09:00:00 10 mL via INTRAVENOUS
  Filled 2019-10-31: qty 10

## 2019-10-31 MED ORDER — SODIUM CHLORIDE 0.9 % IV SOLN
2000.0000 mg | Freq: Once | INTRAVENOUS | Status: AC
  Administered 2019-10-31: 10:00:00 2000 mg via INTRAVENOUS
  Filled 2019-10-31: qty 52.6

## 2019-10-31 NOTE — Progress Notes (Signed)
Hematology/Oncology  Follow up note [facility] [phone] [phone]   Patient Care Team: Paulene Floor as PCP - General (Physician Assistant) Clent Jacks, RN as Oncology Nurse Navigator  REFERRING PROVIDER: Trinna Post, PA-C  CHIEF COMPLAINTS/REASON FOR VISIT:  Follow up for treatment of pancreatic cancer  HISTORY OF PRESENTING ILLNESS:   Craig [name] is a  74 y.o.  male with PMH listed below, including CABG x5, PVD, hypertension, former tobacco abuse, former alcohol abuse, iron deficiency anemia, was seen in consultation at the request of  Terrilee Croak, Adriana M, PA-C  for evaluation of abnormal CT Patient recently presented to primary care provider complaining for abdominal pain radiating to his back for about 10 days.  Patient uses Tylenol as needed for pain. Work-up showed elevated amylase, lipase, mild anemia with hemoglobin of 11.3 Acute hepatitis panel negative. 05/16/2019 CT abdomen pelvis with contrast showed poorly marginated heterogeneous hypodense 2.9 cm pancreatic mass at the uncinate process, worrisome for primary pancreatic cancer.  No biliary or pancreatic duct dilation. Several ill defined hypodense liver masses scattered throughout the liver, largest 3.1 cm in the segment 6 right liver lobe. Nonspecific portacaval and aortocaval adenopathy. Extreme medial left lung base 4.4 cm pleural-based mass probably a pleural metastasis.  Additional tiny pulmonary nodules scattered at the right lung base are indeterminate. Mild prostate or megaly  Patient was referred to heme-onc for further evaluation. Patient reports that his abdominal pain was 10 out of 10 a few days ago, he uses Tylenol as needed. Today his pain is getting better, 2 out of 10.  Denies any nausea, vomiting, diarrhea, fever or chills. Married, lives with wife.  Appetite is good.  He weighs 195 pounds today at the clinic. His weight was 204 pounds  back in May 2020.  He had a history of 37.5-pack-year smoking history quitting 2018. No longer drinking alcohol.  # ultrasound-guided liver biopsy on 05/21/2019.  Pathology showed fragments of benign hepatic parenchyma showing minimal macro vascular steatosis, otherwise no significant histo pathologic change. Single core of renal tissue showing approximately 25% glomerulosclerosis  # #History of CAD, status post CABG x5.  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  # Omniseq test showed PD-L1 50% TPS, TMB high, negative for BRCA1/2 or NTRK fusion.  MSI could not be completed.   INTERVAL HISTORY Craig [name] is a 74 y.o. male who has above history reviewed by me today presents for evaluation prior to chemotherapy for metastatic pancreatic cancer.   Problems and complaints are listed below: Patient has been on palliative chemotherapy with single agent gemcitabine for treatment of pancreatic cancer.   Patient reports that he continues to have right knee pain.  Previously knee aspiration by orthopedic surgeon felt improvement.  Now pain is worse again.  Takes ibuprofen 800 mg daily as needed for residual discomfort. 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  Symptoms are stable.  Denies fever, chills, nausea, vomiting.  Appetite is fair. . . Review of Systems  Constitutional: Positive for fatigue. Negative for appetite change, chills, fever and unexpected weight change.  HENT:   Negative for hearing loss and voice change.   Eyes: Negative for eye problems and icterus.  Respiratory: Negative for chest tightness, cough and shortness of breath.   Cardiovascular: Negative for chest pain and leg swelling.  Gastrointestinal: Negative for abdominal distention  and abdominal pain.  Endocrine: Negative for hot flashes.  Genitourinary:  Negative for difficulty urinating, dysuria and frequency.   Musculoskeletal: Positive for arthralgias.       Right knee status post aspiration.  Denies any cough.  Skin: Negative for itching and rash.  Neurological: Positive for numbness. Negative for light-headedness.  Hematological: Negative for adenopathy. Does not bruise/bleed easily.  Psychiatric/Behavioral: Negative for confusion.    MEDICAL HISTORY:  Past Medical History:  Diagnosis Date  . Allergic rhinitis   . Cancer [facility])    new dx Pancreatic Cancer - Aug 2020  . CHF (congestive heart failure) ([place])   . Chronic cholecystitis   . Coronary artery disease   . DDD (degenerative disc disease), cervical   . Dehydration 06/21/2019  . Dyspnea   . Early cataract   . Erectile dysfunction   . GERD (gastroesophageal reflux disease)   . Gouty arthritis   . Headache    occasional migraines  . Hypercalcemia   . Hyperlipemia   . Hypertension   . Palpitations   . Peripheral vascular disease ([place])   . 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   LIMA to LAD SVG to [place] SVG SEQUENTIALLY to OM1 and OM2 SVG to ACUTE MARGINAL  . Spinal stenosis of cervical region   . Vitamin D deficiency     SURGICAL HISTORY: Past Surgical History:  Procedure Laterality Date  . BACK SURGERY  2018    fusion with screws  . CHOLECYSTECTOMY N/A 11/13/2018   Procedure: LAPAROSCOPIC CHOLECYSTECTOMY;  Surgeon: Vickie Epley, MD;  Location: [facility] ORS;  Service: General;  Laterality: N/A;  . COLONOSCOPY    . CORONARY ARTERY BYPASS GRAFT N/A 09/30/2017   Procedure: CORONARY ARTERY BYPASS GRAFTING times five using right and left Saphaneous vein harvested endoscopicly  and left internal mammary artery. (CABG),TEE;  Surgeon: Rexene Alberts, MD;  Location: [place];  Service: Open Heart Surgery;  Laterality: N/A;  . LEFT HEART CATH AND CORONARY ANGIOGRAPHY Left 09/06/2017   Procedure: LEFT HEART CATH AND CORONARY ANGIOGRAPHY;  Surgeon: Corey Skains, MD;   Location: [place] [facility];  Service: Cardiovascular;  Laterality: Left;  . percutaneous transluminal balloon angioplasty  01/2010   of left lower extremity  . PORTA CATH INSERTION N/A 06/06/2019   Procedure: PORTA CATH INSERTION;  Surgeon: Katha Cabal, MD;  Location: [place] [facility];  Service: Cardiovascular;  Laterality: N/A;  . TEE WITHOUT CARDIOVERSION N/A 09/30/2017   Procedure: TRANSESOPHAGEAL ECHOCARDIOGRAM (TEE);  Surgeon: Rexene Alberts, MD;  Location: [place];  Service: Open Heart Surgery;  Laterality: N/A;  . VASCULAR SURGERY Left 2010   left exernal iliac and superficial femoral artery PTCA and stenting    SOCIAL HISTORY: Social History   Socioeconomic History  . Marital status: Married    Spouse name: Enid Derry  . Number of children: 2  . Years of education: Not on file  . Highest education level: Not on file  Occupational History  . Occupation: drove tractors    Comment: retired  Tobacco Use  . Smoking status: Former Smoker    Packs/day: 0.75    Years: 50.00    Pack years: 37.50    Types: Cigarettes    Quit date: 09/27/2017    Years since quitting: 2.0  . Smokeless tobacco: Former Systems developer    Types: Chew  Substance and Sexual Activity  . Alcohol use: Not Currently    Comment: stopped drinking before cabg  . Drug use: Not  Currently    Types: Marijuana    Comment: stopped smoking before CABG  . Sexual activity: Not Currently  Other Topics Concern  . Not on file  Social History Narrative  . Not on file   Social Determinants of Health   Financial Resource Strain: Low Risk   . Difficulty of Paying Living Expenses: Not hard at all  Food Insecurity: No Food Insecurity  . Worried About Charity fundraiser in the Last Year: Never true  . Ran Out of Food in the Last Year: Never true  Transportation Needs: No Transportation Needs  . Lack of Transportation (Medical): No  . Lack of Transportation (Non-Medical): No  Physical Activity: Sufficiently  Active  . Days of Exercise per Week: 5 days  . Minutes of Exercise per Session: 150+ min  Stress: No Stress Concern Present  . Feeling of Stress : Not at all  Social Connections: Somewhat Isolated  . Frequency of Communication with Friends and Family: More than three times a week  . Frequency of Social Gatherings with Friends and Family: More than three times a week  . Attends Religious Services: Never  . Active Member of Clubs or Organizations: No  . Attends Archivist Meetings: Never  . Marital Status: Married  Human resources officer Violence:   . Fear of Current or Ex-Partner: Not on file  . Emotionally Abused: Not on file  . Physically Abused: Not on file  . Sexually Abused: Not on file    FAMILY HISTORY: Family History  Problem Relation Age of Onset  . Brain cancer Mother   . Emphysema Father   . Cancer Brother     ALLERGIES:  is allergic to lipitor [atorvastatin]; zetia [ezetimibe]; lisinopril; protonix [pantoprazole]; and spironolactone.  MEDICATIONS:  Current Outpatient Medications  Medication Sig Dispense Refill  . aspirin EC 81 MG tablet Take 81 mg by mouth daily.    . carvedilol (COREG) 6.25 MG tablet Take 6.25 mg by mouth 2 (two) times daily with a meal.  3  . diltiazem (CARDIZEM CD) 300 MG 24 hr capsule TAKE 1 CAPSULE BY MOUTH EVERY DAY 90 capsule 1  . dorzolamide-timolol (COSOPT) 22.3-6.8 MG/ML ophthalmic solution INSTILL ONE DROP INTO BOTH EYES TWICE DAILY    . ferrous sulfate 325 (65 FE) MG tablet TAKE 1 TABLET BY MOUTH EVERY DAY WITH BREAKFAST 90 tablet 1  . gabapentin (NEURONTIN) 300 MG capsule TAKE 2 CAPSULES (600 MG TOTAL) BY MOUTH 3 (THREE) TIMES DAILY. 540 capsule 0  . ibuprofen (ADVIL) 800 MG tablet Take 1 tablet (800 mg total) by mouth every 8 (eight) hours as needed. 30 tablet 0  . latanoprost (XALATAN) 0.005 % ophthalmic solution Place 1 drop into both eyes at bedtime.   3  . lidocaine-prilocaine (EMLA) cream APPLY TO AFFECTED AREA ONCE AS  DIRECTED    . Multiple Vitamin (MULTIVITAMIN WITH MINERALS) TABS tablet Take 1 tablet by mouth daily.    . OMEGA-3 FATTY ACIDS PO Take 1,200 mg by mouth daily.     [name] Kitchen omeprazole (PRILOSEC) 40 MG capsule TAKE 1 CAPSULE BY MOUTH EVERY DAY 90 capsule 0   No current facility-administered medications for this visit.   Facility-Administered Medications Ordered in Other Visits  Medication Dose Route Frequency Provider Last Rate Last Admin  . gemcitabine (GEMZAR) 2,000 mg in sodium chloride 0.9 % 250 mL chemo infusion  2,000 mg Intravenous Once Earlie Server, MD 605 mL/hr at 10/31/19 0952 2,000 mg at 10/31/19 0952  . heparin lock flush  100 unit/mL  500 Units Intracatheter Once PRN Earlie Server, MD      . sodium chloride flush (NS) 0.9 % injection 10 mL  10 mL Intracatheter PRN Earlie Server, MD      . sodium chloride flush (NS) 0.9 % injection 10 mL  10 mL Intravenous Once Earlie Server, MD         PHYSICAL EXAMINATION: ECOG PERFORMANCE STATUS: 1 - Symptomatic but completely ambulatory Vitals:   10/31/19 0848  BP: (!) 172/90  Pulse: 73  Resp: 18  Temp: 97.7 F (36.5 C)   Filed Weights   10/31/19 0848  Weight: 180 lb 9.6 oz (81.9 kg)    Physical Exam Constitutional:      General: He is not in acute distress.    Comments: Patient walks independently  HENT:     Head: Normocephalic and atraumatic.  Eyes:     General: No scleral icterus.    Pupils: Pupils are equal, round, and reactive to light.  Cardiovascular:     Rate and Rhythm: Normal rate and regular rhythm.     Heart sounds: Normal heart sounds.  Pulmonary:     Effort: Pulmonary effort is normal. No respiratory distress.     Breath sounds: No wheezing.  Abdominal:     General: Bowel sounds are normal. There is no distension.     Palpations: Abdomen is soft. There is no mass.     Tenderness: There is no abdominal tenderness.  Musculoskeletal:        General: Swelling present. No deformity. Normal range of motion.     Cervical back: Normal  range of motion and neck supple.     Comments: Right knee swelling has improved.   Skin:    General: Skin is warm and dry.     Findings: No erythema or rash.     Comments: Mediport+  Neurological:     General: No focal deficit present.     Mental Status: He is alert and oriented to person, place, and time.     Cranial Nerves: No cranial nerve deficit.     Coordination: Coordination normal.  Psychiatric:        Mood and Affect: Mood normal.        Behavior: Behavior normal.        Thought Content: Thought content normal.     LABORATORY DATA:  I have reviewed the data as listed Lab Results  Component Value Date   WBC 3.3 (L) 10/31/2019   HGB 8.6 (L) 10/31/2019   HCT 29.2 (L) 10/31/2019   MCV 96.4 10/31/2019   PLT 299 10/31/2019   Recent Labs    10/17/19 0825 10/24/19 0823 10/31/19 0840  NA 140 138 140  K 4.3 4.0 3.7  CL 109 107 109  CO2 '22 23 22  '$ GLUCOSE 86 117* 119*  BUN '21 20 16  '$ CREATININE 1.02 0.86 0.94  CALCIUM 9.4 9.2 9.1  GFRNONAA >60 >60 >60  GFRAA >60 >60 >60  PROT 7.3 7.1 7.0  ALBUMIN 3.4* 3.4* 3.3*  AST '25 17 28  '$ ALT '29 20 26  '$ ALKPHOS 58 55 55  BILITOT 0.5 0.5 0.4   Iron/TIBC/Ferritin/ %Sat    Component Value Date/Time   IRON 119 04/27/2019 0940   TIBC 363 04/27/2019 0940   FERRITIN 58 04/27/2019 0940   IRONPCTSAT 33 04/27/2019 0940      RADIOGRAPHIC STUDIES: I have personally reviewed the radiological images as listed and agreed with the findings in the report.  DG Knee 1-2 Views Right  Result Date: 10/15/2019 CLINICAL DATA:  Lateral knee pain and swelling with difficulty walking. No known injury. History of colon cancer. EXAM: RIGHT KNEE - 1-2 VIEW COMPARISON:  None. FINDINGS: The mineralization and alignment are normal. There is no evidence of acute fracture or dislocation. The joint spaces are relatively preserved. There is fragmentation of the patella superolaterally consistent with a bipartite patella (normal variant). There is a moderate  size knee joint effusion. Femoropopliteal atherosclerosis is noted with vascular clips medially in the lower leg. IMPRESSION: 1. No acute osseous findings. 2. Moderate size knee joint effusion. 3. Bipartite patella. Electronically Signed   By: Richardean Sale M.D.   On: 10/15/2019 14:31   CT Chest W Contrast  Result Date: 09/10/2019 CLINICAL DATA:  Restaging metastatic pancreatic cancer. EXAM: CT CHEST, ABDOMEN, AND PELVIS WITH CONTRAST TECHNIQUE: Multidetector CT imaging of the chest, abdomen and pelvis was performed following the standard protocol during bolus administration of intravenous contrast. CONTRAST:  117m OMNIPAQUE IOHEXOL 300 MG/ML  SOLN COMPARISON:  PET-CT 06/04/2019 FINDINGS: CT CHEST FINDINGS Cardiovascular: The heart is normal in size. No pericardial effusion. Stable tortuosity and advanced atherosclerotic calcifications involving the thoracic aorta. 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 Stable advanced three-vessel coronary artery calcifications. Normal appearance of the pulmonary arteries. Mediastinum/Nodes: Stable small scattered mediastinal and hilar lymph nodes. 7.5 mm right paratracheal node on image 11/504. 6 mm right paratracheal node on image 18/504. 5 mm prevascular lymph node on image 18/504. 6.5 mm subcarinal lymph node on image 26/504. The esophagus is grossly normal. Stable small scattered thyroid nodules. Lungs/Pleura: Stable emphysematous changes. No acute pulmonary findings. A few tiny scattered subpleural pulmonary nodules are stable and likely benign. Musculoskeletal: No chest wall masses are identified. Right-sided Port-A-Cath is stable. No supraclavicular or axillary adenopathy. No worrisome bone lesions to suggest metastatic disease. CT ABDOMEN PELVIS FINDINGS Hepatobiliary: No hepatic lesions are identified. The small hepatic metastatic lesions have resolved. No new lesions. The portal and hepatic veins are patent. The gallbladder is surgically  absent. No common bile duct dilatation. Pancreas: Ill-defined lesion in the lower pancreatic head/uncinate process measures approximately 3 x 3 cm and appears relatively stable. The pancreatic body and tail appear normal. Spleen: Normal size.  No focal lesions. Adrenals/Urinary Tract: The adrenal glands and kidneys are unremarkable. Stable renal cysts. The bladder is unremarkable. Stomach/Bowel: The stomach, duodenum, small bowel and colon are grossly normal. No acute inflammatory changes, mass lesions or obstructive findings. The terminal ileum and appendix are normal. Vascular/Lymphatic: Stable advanced atherosclerotic calcifications involving the aorta, iliac arteries and branch vessels. No aneurysm or dissection. Marked interval reduction in size of the hypermetabolic upper abdominal lymph nodes. 7 mm node between the portal vein and IVC on image 56/504 previously measured 14.5 mm. 5.5 mm interaortocaval lymph node on image 65/504 previously measured 10.5 cm. Reproductive: Enlarged prostate gland with median lobe hypertrophy impressing on the base of the bladder. The seminal vesicles appear normal. Other: Small amount of free pelvic fluid is noted. No inguinal mass or adenopathy. 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 Musculoskeletal: No significant bony findings. Stable lumbar fusion hardware. IMPRESSION: 1. CT findings suggest a good response to treatment. No discernible residual hepatic metastatic lesions are identified. 2. Significant interval reduction in size of the abdominal lymph nodes. The pancreatic lesions difficult to measure exactly but  appears relatively stable in size. 3. Stable small scattered mediastinal and hilar lymph nodes. 4. Stable small scattered subpleural pulmonary nodules, likely benign. 5. Stable pleural lesion at the left lung base, this is likely benign fibrous tumor  of the pleura and was not hypermetabolic on the previous PET-CT. Electronically Signed   By: Marijo Sanes M.D.   On: 09/10/2019 10:01   CT Abdomen Pelvis W Contrast  Result Date: 09/10/2019 CLINICAL DATA:  Restaging metastatic pancreatic cancer. EXAM: CT CHEST, ABDOMEN, AND PELVIS WITH CONTRAST TECHNIQUE: Multidetector CT imaging of the chest, abdomen and pelvis was performed following the standard protocol during bolus administration of intravenous contrast. CONTRAST:  126m OMNIPAQUE IOHEXOL 300 MG/ML  SOLN COMPARISON:  PET-CT 06/04/2019 FINDINGS: CT CHEST FINDINGS Cardiovascular: The heart is normal in size. No pericardial effusion. Stable tortuosity and advanced atherosclerotic calcifications involving the thoracic aorta. 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 Stable advanced three-vessel coronary artery calcifications. Normal appearance of the pulmonary arteries. Mediastinum/Nodes: Stable small scattered mediastinal and hilar lymph nodes. 7.5 mm right paratracheal node on image 11/504. 6 mm right paratracheal node on image 18/504. 5 mm prevascular lymph node on image 18/504. 6.5 mm subcarinal lymph node on image 26/504. The esophagus is grossly normal. Stable small scattered thyroid nodules. Lungs/Pleura: Stable emphysematous changes. No acute pulmonary findings. A few tiny scattered subpleural pulmonary nodules are stable and likely benign. Musculoskeletal: No chest wall masses are identified. Right-sided Port-A-Cath is stable. No supraclavicular or axillary adenopathy. No worrisome bone lesions to suggest metastatic disease. CT ABDOMEN PELVIS FINDINGS Hepatobiliary: No hepatic lesions are identified. The small hepatic metastatic lesions have resolved. No new lesions. The portal and hepatic veins are patent. The gallbladder is surgically absent. No common bile duct dilatation. Pancreas: Ill-defined lesion in the lower pancreatic head/uncinate process measures approximately 3 x 3 cm  and appears relatively stable. The pancreatic body and tail appear normal. Spleen: Normal size.  No focal lesions. Adrenals/Urinary Tract: The adrenal glands and kidneys are unremarkable. Stable renal cysts. The bladder is unremarkable. Stomach/Bowel: The stomach, duodenum, small bowel and colon are grossly normal. No acute inflammatory changes, mass lesions or obstructive findings. The terminal ileum and appendix are normal. Vascular/Lymphatic: Stable advanced atherosclerotic calcifications involving the aorta, iliac arteries and branch vessels. No aneurysm or dissection. Marked interval reduction in size of the hypermetabolic upper abdominal lymph nodes. 7 mm node between the portal vein and IVC on image 56/504 previously measured 14.5 mm. 5.5 mm interaortocaval lymph node on image 65/504 previously measured 10.5 cm. Reproductive: Enlarged prostate gland with median lobe hypertrophy impressing on the base of the bladder. The seminal vesicles appear normal. Other: Small amount of free pelvic fluid is noted. No inguinal mass or adenopathy. 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 Musculoskeletal: No significant bony findings. Stable lumbar fusion hardware. IMPRESSION: 1. CT findings suggest a good response to treatment. No discernible residual hepatic metastatic lesions are identified. 2. Significant interval reduction in size of the abdominal lymph nodes. The pancreatic lesions difficult to measure exactly but appears relatively stable in size. 3. Stable small scattered mediastinal and hilar lymph nodes. 4. Stable small scattered subpleural pulmonary nodules, likely benign. 5. 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 Electronically Signed   By: PMarijo Sanes  M.D.   On: 09/10/2019 10:01       ASSESSMENT & PLAN:  1. Pancreatic cancer metastasized to liver ([place])   2. Port-A-Cath in place   3. Effusion of right knee   4. Encounter for antineoplastic chemotherapy   5. Neuropathy   6. Anemia due to antineoplastic chemotherapy     #Metastatic pancreatic cancer- liver mets, hilar nodal metastasis, ?bone mets, Labs are reviewed and discussed with patient. Counts acceptable to proceed with gemcitabine 1000 mg/m today. Abraxane is currently on hold due to worsening of pre-existing neuropathy. Discussed with patient that I am concerned that his CA 19-9 is progressively rising. I recommend obtaining CT chest abdomen pelvis for evaluation of treatment response.  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  Gabapentin 600 mg 3 times daily.  Abraxane has been held. #Anemia, multifactorial secondary to chemotherapy and other comorbidities. Hemoglobin 8.6 today.  Continue to monitor.   #Right knee effusion, status post aspiration.  I advised patient to follow-up with orthopedic surgeon for additional evaluation. He may take ibuprofen 800 mg every 8 hours as needed for pain. All questions were answered. The patient knows to call the clinic with any problems questions or concerns. Follow-up in 2 weeks for chemotherapy.  Earlie Server, MD, PhD Hematology Oncology [place] at [facility] 10/31/2019

## 2019-10-31 NOTE — Progress Notes (Signed)
Pt here for follow up. Pt has soreness to right knee.

## 2019-11-05 ENCOUNTER — Ambulatory Visit
Admission: RE | Admit: 2019-11-05 | Discharge: 2019-11-05 | Disposition: A | Discharge status: Home / Self Care | Payer: Medicare Other | Source: Ambulatory Visit | Attending: Oncology | Admitting: Oncology

## 2019-11-05 ENCOUNTER — Other Ambulatory Visit: Payer: Self-pay

## 2019-11-05 DIAGNOSIS — C787 Secondary malignant neoplasm of liver and intrahepatic bile duct: Secondary | ICD-10-CM | POA: Diagnosis present

## 2019-11-05 DIAGNOSIS — C259 Malignant neoplasm of pancreas, unspecified: Secondary | ICD-10-CM | POA: Diagnosis present

## 2019-11-05 MED ORDER — IOHEXOL 300 MG/ML  SOLN
100.0000 mL | Freq: Once | INTRAMUSCULAR | Status: AC | PRN
  Administered 2019-11-05: 100 mL via INTRAVENOUS

## 2019-11-13 ENCOUNTER — Other Ambulatory Visit: Payer: Self-pay

## 2019-11-13 NOTE — Progress Notes (Signed)
Patient pre screened for office appointment, no questions or concerns today. Patient reminded of upcoming appointment time and date. 

## 2019-11-14 ENCOUNTER — Inpatient Hospital Stay (HOSPITAL_BASED_OUTPATIENT_CLINIC_OR_DEPARTMENT_OTHER): Payer: Medicare Other | Admitting: Oncology

## 2019-11-14 ENCOUNTER — Encounter: Payer: Self-pay | Admitting: Oncology

## 2019-11-14 ENCOUNTER — Inpatient Hospital Stay: Payer: Medicare Other

## 2019-11-14 ENCOUNTER — Inpatient Hospital Stay: Payer: Medicare Other | Attending: Oncology

## 2019-11-14 ENCOUNTER — Other Ambulatory Visit: Payer: Self-pay

## 2019-11-14 VITALS — BP 161/84 | HR 81 | Temp 98.3°F | Resp 18 | Wt 179.9 lb

## 2019-11-14 DIAGNOSIS — R918 Other nonspecific abnormal finding of lung field: Secondary | ICD-10-CM | POA: Diagnosis not present

## 2019-11-14 DIAGNOSIS — Z87891 Personal history of nicotine dependence: Secondary | ICD-10-CM | POA: Insufficient documentation

## 2019-11-14 DIAGNOSIS — Z791 Long term (current) use of non-steroidal anti-inflammatories (NSAID): Secondary | ICD-10-CM | POA: Insufficient documentation

## 2019-11-14 DIAGNOSIS — Z9049 Acquired absence of other specified parts of digestive tract: Secondary | ICD-10-CM | POA: Insufficient documentation

## 2019-11-14 DIAGNOSIS — M1711 Unilateral primary osteoarthritis, right knee: Secondary | ICD-10-CM | POA: Diagnosis not present

## 2019-11-14 DIAGNOSIS — I2510 Atherosclerotic heart disease of native coronary artery without angina pectoris: Secondary | ICD-10-CM | POA: Diagnosis not present

## 2019-11-14 DIAGNOSIS — C259 Malignant neoplasm of pancreas, unspecified: Secondary | ICD-10-CM | POA: Diagnosis present

## 2019-11-14 DIAGNOSIS — C7951 Secondary malignant neoplasm of bone: Secondary | ICD-10-CM | POA: Insufficient documentation

## 2019-11-14 DIAGNOSIS — I739 Peripheral vascular disease, unspecified: Secondary | ICD-10-CM | POA: Diagnosis not present

## 2019-11-14 DIAGNOSIS — Q741 Congenital malformation of knee: Secondary | ICD-10-CM | POA: Insufficient documentation

## 2019-11-14 DIAGNOSIS — N281 Cyst of kidney, acquired: Secondary | ICD-10-CM | POA: Diagnosis not present

## 2019-11-14 DIAGNOSIS — M2550 Pain in unspecified joint: Secondary | ICD-10-CM | POA: Insufficient documentation

## 2019-11-14 DIAGNOSIS — Z888 Allergy status to other drugs, medicaments and biological substances status: Secondary | ICD-10-CM | POA: Insufficient documentation

## 2019-11-14 DIAGNOSIS — C787 Secondary malignant neoplasm of liver and intrahepatic bile duct: Secondary | ICD-10-CM | POA: Diagnosis not present

## 2019-11-14 DIAGNOSIS — Z5112 Encounter for antineoplastic immunotherapy: Secondary | ICD-10-CM

## 2019-11-14 DIAGNOSIS — Z836 Family history of other diseases of the respiratory system: Secondary | ICD-10-CM | POA: Insufficient documentation

## 2019-11-14 DIAGNOSIS — T451X5A Adverse effect of antineoplastic and immunosuppressive drugs, initial encounter: Secondary | ICD-10-CM

## 2019-11-14 DIAGNOSIS — Z809 Family history of malignant neoplasm, unspecified: Secondary | ICD-10-CM | POA: Insufficient documentation

## 2019-11-14 DIAGNOSIS — G629 Polyneuropathy, unspecified: Secondary | ICD-10-CM | POA: Insufficient documentation

## 2019-11-14 DIAGNOSIS — E785 Hyperlipidemia, unspecified: Secondary | ICD-10-CM | POA: Insufficient documentation

## 2019-11-14 DIAGNOSIS — C771 Secondary and unspecified malignant neoplasm of intrathoracic lymph nodes: Secondary | ICD-10-CM | POA: Insufficient documentation

## 2019-11-14 DIAGNOSIS — M25461 Effusion, right knee: Secondary | ICD-10-CM

## 2019-11-14 DIAGNOSIS — D6481 Anemia due to antineoplastic chemotherapy: Secondary | ICD-10-CM | POA: Diagnosis not present

## 2019-11-14 DIAGNOSIS — R160 Hepatomegaly, not elsewhere classified: Secondary | ICD-10-CM | POA: Diagnosis not present

## 2019-11-14 DIAGNOSIS — I700 Atherosclerosis of aorta: Secondary | ICD-10-CM | POA: Insufficient documentation

## 2019-11-14 DIAGNOSIS — I110 Hypertensive heart disease with heart failure: Secondary | ICD-10-CM | POA: Insufficient documentation

## 2019-11-14 DIAGNOSIS — R109 Unspecified abdominal pain: Secondary | ICD-10-CM | POA: Insufficient documentation

## 2019-11-14 DIAGNOSIS — Z981 Arthrodesis status: Secondary | ICD-10-CM | POA: Insufficient documentation

## 2019-11-14 DIAGNOSIS — R200 Anesthesia of skin: Secondary | ICD-10-CM | POA: Insufficient documentation

## 2019-11-14 DIAGNOSIS — K219 Gastro-esophageal reflux disease without esophagitis: Secondary | ICD-10-CM | POA: Diagnosis not present

## 2019-11-14 DIAGNOSIS — R5383 Other fatigue: Secondary | ICD-10-CM | POA: Diagnosis not present

## 2019-11-14 DIAGNOSIS — Z85038 Personal history of other malignant neoplasm of large intestine: Secondary | ICD-10-CM | POA: Insufficient documentation

## 2019-11-14 DIAGNOSIS — Z808 Family history of malignant neoplasm of other organs or systems: Secondary | ICD-10-CM | POA: Insufficient documentation

## 2019-11-14 DIAGNOSIS — Z79899 Other long term (current) drug therapy: Secondary | ICD-10-CM | POA: Insufficient documentation

## 2019-11-14 LAB — COMPREHENSIVE METABOLIC PANEL
ALT: 20 U/L (ref 0–44)
AST: 17 U/L (ref 15–41)
Albumin: 3.3 g/dL — ABNORMAL LOW (ref 3.5–5.0)
Alkaline Phosphatase: 62 U/L (ref 38–126)
Anion gap: 9 (ref 5–15)
BUN: 19 mg/dL (ref 8–23)
CO2: 23 mmol/L (ref 22–32)
Calcium: 9.2 mg/dL (ref 8.9–10.3)
Chloride: 105 mmol/L (ref 98–111)
Creatinine, Ser: 0.83 mg/dL (ref 0.61–1.24)
GFR calc Af Amer: >60 mL/min (ref >60)
GFR calc non Af Amer: >60 mL/min (ref >60)
Glucose, Bld: 131 mg/dL — ABNORMAL HIGH (ref 70–99)
Potassium: 4.1 mmol/L (ref 3.5–5.1)
Sodium: 137 mmol/L (ref 135–145)
Total Bilirubin: 0.4 mg/dL (ref 0.3–1.2)
Total Protein: 7.2 g/dL (ref 6.5–8.1)

## 2019-11-14 LAB — CBC WITH DIFFERENTIAL/PLATELET
Abs Immature Granulocytes: 0.04 10*3/uL (ref 0.00–0.07)
Basophils Absolute: 0.1 10*3/uL (ref 0.0–0.1)
Basophils Relative: 1 %
Eosinophils Absolute: 0.2 10*3/uL (ref 0.0–0.5)
Eosinophils Relative: 3 %
HCT: 31.9 % — ABNORMAL LOW (ref 39.0–52.0)
Hemoglobin: 9.4 g/dL — ABNORMAL LOW (ref 13.0–17.0)
Immature Granulocytes: 1 %
Lymphocytes Relative: 12 %
Lymphs Abs: 0.8 10*3/uL (ref 0.7–4.0)
MCH: 28.5 pg (ref 26.0–34.0)
MCHC: 29.5 g/dL — ABNORMAL LOW (ref 30.0–36.0)
MCV: 96.7 fL (ref 80.0–100.0)
Monocytes Absolute: 0.7 10*3/uL (ref 0.1–1.0)
Monocytes Relative: 12 %
Neutro Abs: 4.6 10*3/uL (ref 1.7–7.7)
Neutrophils Relative %: 71 %
Platelets: 475 10*3/uL — ABNORMAL HIGH (ref 150–400)
RBC: 3.3 MIL/uL — ABNORMAL LOW (ref 4.22–5.81)
RDW: 18.7 % — ABNORMAL HIGH (ref 11.5–15.5)
WBC: 6.4 10*3/uL (ref 4.0–10.5)
nRBC: 0 % (ref 0.0–0.2)

## 2019-11-14 MED ORDER — HEPARIN SOD (PORK) LOCK FLUSH 100 UNIT/ML IV SOLN
INTRAVENOUS | Status: AC
  Filled 2019-11-14: qty 5

## 2019-11-14 MED ORDER — HEPARIN SOD (PORK) LOCK FLUSH 100 UNIT/ML IV SOLN
500.0000 [IU] | Freq: Once | INTRAVENOUS | Status: AC
  Administered 2019-11-14: 09:00:00 500 [IU] via INTRAVENOUS
  Filled 2019-11-14: qty 5

## 2019-11-14 MED ORDER — SODIUM CHLORIDE 0.9% FLUSH
10.0000 mL | Freq: Once | INTRAVENOUS | Status: AC
  Administered 2019-11-14: 10 mL via INTRAVENOUS
  Filled 2019-11-14: qty 10

## 2019-11-14 NOTE — Progress Notes (Signed)
Knee is feeling better.

## 2019-11-14 NOTE — Progress Notes (Signed)
Hematology/Oncology  Follow up note [facility] [phone] [phone]   Patient Care Team: Paulene Floor as PCP - General (Physician Assistant) Clent Jacks, RN as Oncology Nurse Navigator Earlie Server, MD as Consulting Physician (Hematology and Oncology)  REFERRING PROVIDER: Trinna Post, PA-C  CHIEF COMPLAINTS/REASON FOR VISIT:  Follow up for treatment of pancreatic cancer  HISTORY OF PRESENTING ILLNESS:   Craig [name] is a  74 y.o.  male with PMH listed below, including CABG x5, PVD, hypertension, former tobacco abuse, former alcohol abuse, iron deficiency anemia, was seen in consultation at the request of  Terrilee Croak, Adriana M, PA-C  for evaluation of abnormal CT Patient recently presented to primary care provider complaining for abdominal pain radiating to his back for about 10 days.  Patient uses Tylenol as needed for pain. Work-up showed elevated amylase, lipase, mild anemia with hemoglobin of 11.3 Acute hepatitis panel negative. 05/16/2019 CT abdomen pelvis with contrast showed poorly marginated heterogeneous hypodense 2.9 cm pancreatic mass at the uncinate process, worrisome for primary pancreatic cancer.  No biliary or pancreatic duct dilation. Several ill defined hypodense liver masses scattered throughout the liver, largest 3.1 cm in the segment 6 right liver lobe. Nonspecific portacaval and aortocaval adenopathy. Extreme medial left lung base 4.4 cm pleural-based mass probably a pleural metastasis.  Additional tiny pulmonary nodules scattered at the right lung base are indeterminate. Mild prostate or megaly  Patient was referred to heme-onc for further evaluation. Patient reports that his abdominal pain was 10 out of 10 a few days ago, he uses Tylenol as needed. Today his pain is getting better, 2 out of 10.  Denies any nausea, vomiting, diarrhea, fever or chills. Married, lives with wife.  Appetite is good.  He  weighs 195 pounds today at the clinic. His weight was 204 pounds back in May 2020.  He had a history of 37.5-pack-year smoking history quitting 2018. No longer drinking alcohol.  # ultrasound-guided liver biopsy on 05/21/2019.  Pathology showed fragments of benign hepatic parenchyma showing minimal macro vascular steatosis, otherwise no significant histo pathologic change. Single core of renal tissue showing approximately 25% glomerulosclerosis  # #History of CAD, status post CABG x5.  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  # Omniseq test showed PD-L1 50% TPS, TMB high, negative for BRCA1/2 or NTRK fusion.  MSI could not be completed.   INTERVAL HISTORY Craig [name] is a 74 y.o. male who has above history reviewed by me today presents for evaluation prior to chemotherapy for metastatic pancreatic cancer.   Problems and complaints are listed below: Patient has been on palliative chemotherapy with single agent gemcitabine for treatment of pancreatic cancer.   Abraxane was stopped due to severe neuropathy. Patient has right lower knee pain follows up with orthopedic surgeon. . 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  Symptoms are stable.  Denies fever, chills, nausea, vomiting.  Appetite is fair. . . Review of Systems  Constitutional: Positive for fatigue. Negative for appetite change, chills, fever and unexpected weight change.  HENT:   Negative for hearing loss and voice change.   Eyes: Negative for eye problems and icterus.  Respiratory: Negative for chest tightness, cough and shortness of breath.   Cardiovascular: Negative for chest pain and leg swelling.  Gastrointestinal: Negative for abdominal distention and abdominal pain.  Endocrine: Negative for hot  flashes.  Genitourinary: Negative for difficulty urinating,  dysuria and frequency.   Musculoskeletal: Positive for arthralgias.       Right knee status post aspiration.  Denies any cough.  Skin: Negative for itching and rash.  Neurological: Positive for numbness. Negative for light-headedness.  Hematological: Negative for adenopathy. Does not bruise/bleed easily.  Psychiatric/Behavioral: Negative for confusion.    MEDICAL HISTORY:  Past Medical History:  Diagnosis Date  . Allergic rhinitis   . Cancer [facility])    new dx Pancreatic Cancer - Aug 2020  . CHF (congestive heart failure) ([place])   . Chronic cholecystitis   . Coronary artery disease   . DDD (degenerative disc disease), cervical   . Dehydration 06/21/2019  . Dyspnea   . Early cataract   . Erectile dysfunction   . GERD (gastroesophageal reflux disease)   . Gouty arthritis   . Headache    occasional migraines  . Hypercalcemia   . Hyperlipemia   . Hypertension   . Palpitations   . Peripheral vascular disease ([place])   . 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   LIMA to LAD SVG to [facility] SVG SEQUENTIALLY to OM1 and OM2 SVG to ACUTE MARGINAL  . Spinal stenosis of cervical region   . Vitamin D deficiency     SURGICAL HISTORY: Past Surgical History:  Procedure Laterality Date  . BACK SURGERY  2018    fusion with screws  . CHOLECYSTECTOMY N/A 11/13/2018   Procedure: LAPAROSCOPIC CHOLECYSTECTOMY;  Surgeon: Vickie Epley, MD;  Location: [facility] OR[facility];  Service: General;  Laterality: N/A;  . COLONOSCOPY    . CORONARY ARTERY BYPASS GRAFT N/A 09/30/2017   Procedure: CORONARY ARTERY BYPASS GRAFTING times five using right and left Saphaneous vein harvested endoscopicly  and left internal mammary artery. (CABG),TEE;  Surgeon: Rexene Alberts, MD;  Location: [place];  Service: Open Heart Surgery;  Laterality: N/A;  . LEFT HEART CATH AND CORONARY ANGIOGRAPHY Left 09/06/2017   Procedure: LEFT HEART CATH AND CORONARY ANGIOGRAPHY;  Surgeon: Corey Skains, MD;  Location: [place] [facility];  Service:  Cardiovascular;  Laterality: Left;  . percutaneous transluminal balloon angioplasty  01/2010   of left lower extremity  . PORTA CATH INSERTION N/A 06/06/2019   Procedure: PORTA CATH INSERTION;  Surgeon: Katha Cabal, MD;  Location: [place] [facility];  Service: Cardiovascular;  Laterality: N/A;  . TEE WITHOUT CARDIOVERSION N/A 09/30/2017   Procedure: TRANSESOPHAGEAL ECHOCARDIOGRAM (TEE);  Surgeon: Rexene Alberts, MD;  Location: [place];  Service: Open Heart Surgery;  Laterality: N/A;  . VASCULAR SURGERY Left 2010   left exernal iliac and superficial femoral artery PTCA and stenting    SOCIAL HISTORY: Social History   Socioeconomic History  . Marital status: Married    Spouse name: Enid Derry  . Number of children: 2  . Years of education: Not on file  . Highest education level: Not on file  Occupational History  . Occupation: drove tractors    Comment: retired  Tobacco Use  . Smoking status: Former Smoker    Packs/day: 0.75    Years: 50.00    Pack years: 37.50    Types: Cigarettes    Quit date: 09/27/2017    Years since quitting: 2.1  . Smokeless tobacco: Former Systems developer    Types: Chew  Substance and Sexual Activity  . Alcohol use: Not Currently    Comment: stopped drinking before cabg  . Drug use: Not Currently    Types: Marijuana  Comment: stopped smoking before CABG  . Sexual activity: Not Currently  Other Topics Concern  . Not on file  Social History Narrative  . Not on file   Social Determinants of Health   Financial Resource Strain: Low Risk   . Difficulty of Paying Living Expenses: Not hard at all  Food Insecurity: No Food Insecurity  . Worried About Charity fundraiser in the Last Year: Never true  . Ran Out of Food in the Last Year: Never true  Transportation Needs: No Transportation Needs  . Lack of Transportation (Medical): No  . Lack of Transportation (Non-Medical): No  Physical Activity: Sufficiently Active  . Days of Exercise per Week: 5 days    . Minutes of Exercise per Session: 150+ min  Stress: No Stress Concern Present  . Feeling of Stress : Not at all  Social Connections: Somewhat Isolated  . Frequency of Communication with Friends and Family: More than three times a week  . Frequency of Social Gatherings with Friends and Family: More than three times a week  . Attends Religious Services: Never  . Active Member of Clubs or Organizations: No  . Attends Archivist Meetings: Never  . Marital Status: Married  Human resources officer Violence:   . Fear of Current or Ex-Partner: Not on file  . Emotionally Abused: Not on file  . Physically Abused: Not on file  . Sexually Abused: Not on file    FAMILY HISTORY: Family History  Problem Relation Age of Onset  . Brain cancer Mother   . Emphysema Father   . Cancer Brother     ALLERGIES:  is allergic to lipitor [atorvastatin]; zetia [ezetimibe]; lisinopril; protonix [pantoprazole]; and spironolactone.  MEDICATIONS:  Current Outpatient Medications  Medication Sig Dispense Refill  . aspirin EC 81 MG tablet Take 81 mg by mouth daily.    . carvedilol (COREG) 6.25 MG tablet Take 6.25 mg by mouth 2 (two) times daily with a meal.  3  . diltiazem (CARDIZEM CD) 300 MG 24 hr capsule TAKE 1 CAPSULE BY MOUTH EVERY DAY 90 capsule 1  . dorzolamide-timolol (COSOPT) 22.3-6.8 MG/ML ophthalmic solution INSTILL ONE DROP INTO BOTH EYES TWICE DAILY    . ferrous sulfate 325 (65 FE) MG tablet TAKE 1 TABLET BY MOUTH EVERY DAY WITH BREAKFAST 90 tablet 1  . gabapentin (NEURONTIN) 300 MG capsule TAKE 2 CAPSULES (600 MG TOTAL) BY MOUTH 3 (THREE) TIMES DAILY. 540 capsule 0  . ibuprofen (ADVIL) 800 MG tablet Take 1 tablet (800 mg total) by mouth every 8 (eight) hours as needed. 30 tablet 0  . latanoprost (XALATAN) 0.005 % ophthalmic solution Place 1 drop into both eyes at bedtime.   3  . lidocaine-prilocaine (EMLA) cream APPLY TO AFFECTED AREA ONCE AS DIRECTED    . Multiple Vitamin (MULTIVITAMIN WITH  MINERALS) TABS tablet Take 1 tablet by mouth daily.    . OMEGA-3 FATTY ACIDS PO Take 1,200 mg by mouth daily.     [name] Kitchen omeprazole (PRILOSEC) 40 MG capsule TAKE 1 CAPSULE BY MOUTH EVERY DAY 90 capsule 0   No current facility-administered medications for this visit.   Facility-Administered Medications Ordered in Other Visits  Medication Dose Route Frequency Provider Last Rate Last Admin  . sodium chloride flush (NS) 0.9 % injection 10 mL  10 mL Intracatheter PRN Earlie Server, MD      . sodium chloride flush (NS) 0.9 % injection 10 mL  10 mL Intravenous Once Earlie Server, MD  PHYSICAL EXAMINATION: ECOG PERFORMANCE STATUS: 1 - Symptomatic but completely ambulatory Vitals:   11/14/19 0858  BP: (!) 161/84  Pulse: 81  Resp: 18  Temp: 98.3 F (36.8 C)   Filed Weights   11/14/19 0858  Weight: 179 lb 14.4 oz (81.6 kg)    Physical Exam Constitutional:      General: He is not in acute distress.    Comments: Patient walks independently  HENT:     Head: Normocephalic and atraumatic.  Eyes:     General: No scleral icterus.    Pupils: Pupils are equal, round, and reactive to light.  Cardiovascular:     Rate and Rhythm: Normal rate and regular rhythm.     Heart sounds: Normal heart sounds.  Pulmonary:     Effort: Pulmonary effort is normal. No respiratory distress.     Breath sounds: No wheezing.  Abdominal:     General: Bowel sounds are normal. There is no distension.     Palpations: Abdomen is soft. There is no mass.     Tenderness: There is no abdominal tenderness.  Musculoskeletal:        General: Swelling present. No deformity. Normal range of motion.     Cervical back: Normal range of motion and neck supple.     Comments: Right knee swelling has improved.   Skin:    General: Skin is warm and dry.     Findings: No erythema or rash.     Comments: Mediport+  Neurological:     General: No focal deficit present.     Mental Status: He is alert and oriented to person, place, and  time.     Cranial Nerves: No cranial nerve deficit.     Coordination: Coordination normal.  Psychiatric:        Mood and Affect: Mood normal.        Behavior: Behavior normal.        Thought Content: Thought content normal.     LABORATORY DATA:  I have reviewed the data as listed Lab Results  Component Value Date   WBC 6.4 11/14/2019   HGB 9.4 (L) 11/14/2019   HCT 31.9 (L) 11/14/2019   MCV 96.7 11/14/2019   PLT 475 (H) 11/14/2019   Recent Labs    10/24/19 0823 10/31/19 0840 11/14/19 0834  NA 138 140 137  K 4.0 3.7 4.1  CL 107 109 105  CO2 _0 GLUCOSE 117* 119* 131*  BUN _1 CREATININE 0.86 0.94 0.83  CALCIUM 9.2 9.1 9.2  GFRNONAA >60 >60 >60  GFRAA >60 >60 >60  PROT 7.1 7.0 7.2  ALBUMIN 3.4* 3.3* 3.3*  AST _2 ALT _3 ALKPHOS 55 55 62  BILITOT 0.5 0.4 0.4   Iron/TIBC/Ferritin/ %Sat    Component Value Date/Time   IRON 119 04/27/2019 0940   TIBC 363 04/27/2019 0940   FERRITIN 58 04/27/2019 0940   IRONPCTSAT 33 04/27/2019 0940      RADIOGRAPHIC STUDIES: I have personally reviewed the radiological images as listed and agreed with the findings in the report. DG Knee 1-2 Views Right  Result Date: 10/15/2019 CLINICAL DATA:  Lateral knee pain and swelling with difficulty walking. No known injury. History of colon cancer. EXAM: RIGHT KNEE - 1-2 VIEW COMPARISON:  None. FINDINGS: The mineralization and alignment are normal. There is no evidence of acute fracture or dislocation. The joint spaces are relatively preserved. There is fragmentation of the patella superolaterally  consistent with a bipartite patella (normal variant). There is a moderate size knee joint effusion. Femoropopliteal atherosclerosis is noted with vascular clips medially in the lower leg. IMPRESSION: 1. No acute osseous findings. 2. Moderate size knee joint effusion. 3. Bipartite patella. Electronically Signed   By: Richardean Sale M.D.   On: 10/15/2019 14:31   CT Chest W  Contrast  Result Date: 11/05/2019 CLINICAL DATA:  Pancreatic cancer. Follow up for treatment response. EXAM: CT CHEST, ABDOMEN, AND PELVIS WITH CONTRAST TECHNIQUE: Multidetector CT imaging of the chest, abdomen and pelvis was performed following the standard protocol during bolus administration of intravenous contrast. CONTRAST:  14m OMNIPAQUE IOHEXOL 300 MG/ML  SOLN COMPARISON:  Prior CTs 05/16/2019 and 09/10/2019. PET-CT 06/04/2019. FINDINGS: CT CHEST FINDINGS Cardiovascular: Diffuse atherosclerosis of the aorta, great vessels and coronary arteries status post median sternotomy and CABG. No acute vascular findings are evident. Right IJ Port-A-Cath extends to the superior cavoatrial junction. Probable aortic valvular calcifications. The heart size is normal. There is no pericardial effusion. Mediastinum/Nodes: There are no enlarged mediastinal, hilar or axillary lymph nodes. Small mediastinal lymph nodes are stable. There is stable minimal nodularity of the thyroid gland, consistent with a benign etiology. The trachea and esophagus appear normal. Lungs/Pleura: No pleural effusion or pneumothorax. Pleural based mass involving the left hemidiaphragm medially is grossly stable, measuring 4.2 x 2.8 cm on image 47/2. This was not hypermetabolic on PET-CT. Scattered tiny subpleural nodules are stable, likely benign. No new or enlarging pulmonary nodules. Musculoskeletal/Chest wall: No chest wall mass or suspicious osseous findings. There are stable degenerative changes within the thoracic spine and stable mild anterior wedging of the T5 vertebral body. Previous median sternotomy. CT ABDOMEN AND PELVIS FINDINGS Hepatobiliary: There are several ill-defined low-density hepatic lesions which are inapparent on the most recent study, suspicious for recurrent metastatic disease. Largest lesion posteriorly in the dome of the right hepatic lobe may reflect 2 adjacent lesions, measuring 2.1 x 1.2 cm on image 51/2. There is a  smaller lesion more anteriorly in the right hepatic lobe measuring 1.1 cm on image 51/2. There is a 1.1 cm lesion inferiorly in the right lobe on image 62/2. Previous cholecystectomy. No significant biliary dilatation. Pancreas: The previously demonstrated low-density mass projecting medially from the uncinate process has resolved. There is stable isodense prominence of the pancreatic head compared with the most recent study, measuring approximately 3.6 x 2.2 cm on image 66/2. No pancreatic ductal dilatation or surrounding inflammation. Spleen: Normal in size without focal abnormality. Adrenals/Urinary Tract: Both adrenal glands appear normal. There are bilateral renal cysts, largest in the lower pole of the right kidney, measuring 4.0 cm. In the lower pole of the left kidney, there is in 9 mm lesion (image 32/7) which is slightly denser than on the previous study, but unchanged in size, probably a complex cyst. No evidence of urinary tract calculus or hydronephrosis. The bladder appears unremarkable for its degree of distention. Stomach/Bowel: No evidence of bowel wall thickening, distention or surrounding inflammatory change. Chronic gastric wall thickening, similar to previous study. Vascular/Lymphatic: There are no enlarged abdominal or pelvic lymph nodes. Stable small lymph nodes in the porta hepatis. Aortic and branch vessel atherosclerosis. No acute vascular findings. Reproductive: Stable mild enlargement of the prostate gland. Other: Intact anterior abdominal wall. No ascites or peritoneal nodularity. Musculoskeletal: No acute or significant osseous findings. Previous L4-5 fusion. IMPRESSION: 1. Several ill-defined low-density hepatic lesions are inapparent on the most recent study, suspicious for recurrent metastatic disease. Consider further  evaluation with abdominal MRI or PET-CT. 2. Grossly stable appearance of the pancreatic head without evidence of local recurrence. No recurrent adenopathy in the chest  or abdomen. 3. Stable small pulmonary nodules bilaterally consistent with a benign etiology. 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 Electronically Signed   By: Richardean Sale M.D.   On: 11/05/2019 11:46   CT Chest W Contrast  Result Date: 09/10/2019 CLINICAL DATA:  Restaging metastatic pancreatic cancer. EXAM: CT CHEST, ABDOMEN, AND PELVIS WITH CONTRAST TECHNIQUE: Multidetector CT imaging of the chest, abdomen and pelvis was performed following the standard protocol during bolus administration of intravenous contrast. CONTRAST:  136m OMNIPAQUE IOHEXOL 300 MG/ML  SOLN COMPARISON:  PET-CT 06/04/2019 FINDINGS: CT CHEST FINDINGS Cardiovascular: The heart is normal in size. No pericardial effusion. Stable tortuosity and advanced atherosclerotic calcifications involving the thoracic aorta. 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 Stable advanced three-vessel coronary artery calcifications. Normal appearance of the pulmonary arteries. Mediastinum/Nodes: Stable small scattered mediastinal and hilar lymph nodes. 7.5 mm right paratracheal node on image 11/504. 6 mm right paratracheal node on image 18/504. 5 mm prevascular lymph node on image 18/504. 6.5 mm subcarinal lymph node on image 26/504. The esophagus is grossly normal. Stable small scattered thyroid nodules. Lungs/Pleura: Stable emphysematous changes. No acute pulmonary findings. A few tiny scattered subpleural pulmonary nodules are stable and likely benign. Musculoskeletal: No chest wall masses are identified. Right-sided Port-A-Cath is stable. No supraclavicular or axillary adenopathy. No worrisome bone lesions to suggest metastatic disease. CT ABDOMEN PELVIS FINDINGS Hepatobiliary: No hepatic lesions are identified. The small hepatic metastatic lesions have resolved. No new lesions. The portal and hepatic veins are patent. The gallbladder is  surgically absent. No common bile duct dilatation. Pancreas: Ill-defined lesion in the lower pancreatic head/uncinate process measures approximately 3 x 3 cm and appears relatively stable. The pancreatic body and tail appear normal. Spleen: Normal size.  No focal lesions. Adrenals/Urinary Tract: The adrenal glands and kidneys are unremarkable. Stable renal cysts. The bladder is unremarkable. Stomach/Bowel: The stomach, duodenum, small bowel and colon are grossly normal. No acute inflammatory changes, mass lesions or obstructive findings. The terminal ileum and appendix are normal. Vascular/Lymphatic: Stable advanced atherosclerotic calcifications involving the aorta, iliac arteries and branch vessels. No aneurysm or dissection. Marked interval reduction in size of the hypermetabolic upper abdominal lymph nodes. 7 mm node between the portal vein and IVC on image 56/504 previously measured 14.5 mm. 5.5 mm interaortocaval lymph node on image 65/504 previously measured 10.5 cm. Reproductive: Enlarged prostate gland with median lobe hypertrophy impressing on the base of the bladder. The seminal vesicles appear normal. Other: Small amount of free pelvic fluid is noted. No inguinal mass or adenopathy. 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 Musculoskeletal: No significant bony findings. Stable lumbar fusion hardware. IMPRESSION: 1. CT findings suggest a good response to treatment. No discernible residual hepatic metastatic lesions are identified. 2. Significant interval reduction in size of the abdominal lymph nodes. The pancreatic lesions difficult to measure exactly but appears relatively stable in size. 3. Stable small scattered mediastinal and hilar lymph nodes. 4. Stable small scattered subpleural pulmonary nodules, likely benign. 5. 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 Electronically Signed   By: Marijo Sanes M.D.   On: 09/10/2019 10:01   CT Abdomen Pelvis W Contrast  Result Date: 11/05/2019 CLINICAL DATA:  Pancreatic cancer. Follow up for treatment response. EXAM: CT CHEST, ABDOMEN, AND PELVIS WITH CONTRAST TECHNIQUE: Multidetector CT imaging of the chest, abdomen and pelvis was performed following the standard protocol during bolus administration of intravenous contrast. CONTRAST:  140m OMNIPAQUE IOHEXOL 300 MG/ML  SOLN COMPARISON:  Prior CTs 05/16/2019 and 09/10/2019. PET-CT 06/04/2019. FINDINGS: CT CHEST FINDINGS Cardiovascular: Diffuse atherosclerosis of the aorta, great vessels and coronary arteries status post median sternotomy and CABG. No acute vascular findings are evident. Right IJ Port-A-Cath extends to the superior cavoatrial junction. Probable aortic valvular calcifications. The heart size is normal. There is no pericardial effusion. Mediastinum/Nodes: There are no enlarged mediastinal, hilar or axillary lymph nodes. Small mediastinal lymph nodes are stable. There is stable minimal nodularity of the thyroid gland, consistent with a benign etiology. The trachea and esophagus appear normal. Lungs/Pleura: No pleural effusion or pneumothorax. Pleural based mass involving the left hemidiaphragm medially is grossly stable, measuring 4.2 x 2.8 cm on image 47/2. This was not hypermetabolic on PET-CT. Scattered tiny subpleural nodules are stable, likely benign. No new or enlarging pulmonary nodules. Musculoskeletal/Chest wall: No chest wall mass or suspicious osseous findings. There are stable degenerative changes within the thoracic spine and stable mild anterior wedging of the T5 vertebral body. Previous median sternotomy. CT ABDOMEN AND PELVIS FINDINGS Hepatobiliary: There are several ill-defined low-density hepatic lesions which are inapparent on the most recent study, suspicious for  recurrent metastatic disease. Largest lesion posteriorly in the dome of the right hepatic lobe may reflect 2 adjacent lesions, measuring 2.1 x 1.2 cm on image 51/2. There is a smaller lesion more anteriorly in the right hepatic lobe measuring 1.1 cm on image 51/2. There is a 1.1 cm lesion inferiorly in the right lobe on image 62/2. Previous cholecystectomy. No significant biliary dilatation. Pancreas: The previously demonstrated low-density mass projecting medially from the uncinate process has resolved. There is stable isodense prominence of the pancreatic head compared with the most recent study, measuring approximately 3.6 x 2.2 cm on image 66/2. No pancreatic ductal dilatation or surrounding inflammation. Spleen: Normal in size without focal abnormality. Adrenals/Urinary Tract: Both adrenal glands appear normal. There are bilateral renal cysts, largest in the lower pole of the right kidney, measuring 4.0 cm. In the lower pole of the left kidney, there is in 9 mm lesion (image 32/7) which is slightly denser than on the previous study, but unchanged in size, probably a complex cyst. No evidence of urinary tract calculus or hydronephrosis. The bladder appears unremarkable for its degree of distention. Stomach/Bowel: No evidence of bowel wall thickening, distention or surrounding inflammatory change. Chronic gastric wall thickening, similar to previous study. Vascular/Lymphatic: There are no enlarged abdominal or pelvic lymph nodes. Stable small lymph nodes in the porta hepatis. Aortic and branch vessel atherosclerosis. No acute vascular findings. Reproductive: Stable mild enlargement of the prostate gland. Other: Intact anterior abdominal wall. No ascites or peritoneal nodularity. Musculoskeletal: No acute or significant osseous findings. Previous L4-5 fusion. IMPRESSION: 1. Several ill-defined low-density hepatic lesions are inapparent on the most recent study, suspicious for recurrent metastatic disease. Consider  further evaluation with abdominal MRI or PET-CT. 2. Grossly stable appearance of the pancreatic head without evidence of local recurrence. No recurrent adenopathy in the chest or abdomen. 3. Stable small pulmonary nodules bilaterally consistent with a benign etiology. 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 Electronically Signed   By: Richardean Sale M.D.   On: 11/05/2019 11:46   CT Abdomen Pelvis W Contrast  Result Date: 09/10/2019 CLINICAL DATA:  Restaging metastatic pancreatic cancer. EXAM: CT CHEST, ABDOMEN, AND PELVIS WITH CONTRAST TECHNIQUE: Multidetector CT imaging of the chest, abdomen and pelvis was performed following the standard protocol during bolus administration of intravenous contrast. CONTRAST:  158m OMNIPAQUE IOHEXOL 300 MG/ML  SOLN COMPARISON:  PET-CT 06/04/2019 FINDINGS: CT CHEST FINDINGS Cardiovascular: The heart is normal in size. No pericardial effusion. Stable tortuosity and advanced atherosclerotic calcifications involving the thoracic aorta. 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 Stable advanced three-vessel coronary artery calcifications. Normal appearance of the pulmonary arteries. Mediastinum/Nodes: Stable small scattered mediastinal and hilar lymph nodes. 7.5 mm right paratracheal node on image 11/504. 6 mm right paratracheal node on image 18/504. 5 mm prevascular lymph node on image 18/504. 6.5 mm subcarinal lymph node on image 26/504. The esophagus is grossly normal. Stable small scattered thyroid nodules. Lungs/Pleura: Stable emphysematous changes. No acute pulmonary findings. A few tiny scattered subpleural pulmonary nodules are stable and likely benign. Musculoskeletal: No chest wall masses are identified. Right-sided Port-A-Cath is stable. No supraclavicular or axillary adenopathy. No worrisome bone lesions to suggest metastatic disease. CT ABDOMEN PELVIS  FINDINGS Hepatobiliary: No hepatic lesions are identified. The small hepatic metastatic lesions have resolved. No new lesions. The portal and hepatic veins are patent. The gallbladder is surgically absent. No common bile duct dilatation. Pancreas: Ill-defined lesion in the lower pancreatic head/uncinate process measures approximately 3 x 3 cm and appears relatively stable. The pancreatic body and tail appear normal. Spleen: Normal size.  No focal lesions. Adrenals/Urinary Tract: The adrenal glands and kidneys are unremarkable. Stable renal cysts. The bladder is unremarkable. Stomach/Bowel: The stomach, duodenum, small bowel and colon are grossly normal. No acute inflammatory changes, mass lesions or obstructive findings. The terminal ileum and appendix are normal. Vascular/Lymphatic: Stable advanced atherosclerotic calcifications involving the aorta, iliac arteries and branch vessels. No aneurysm or dissection. Marked interval reduction in size of the hypermetabolic upper abdominal lymph nodes. 7 mm node between the portal vein and IVC on image 56/504 previously measured 14.5 mm. 5.5 mm interaortocaval lymph node on image 65/504 previously measured 10.5 cm. Reproductive: Enlarged prostate gland with median lobe hypertrophy impressing on the base of the bladder. The seminal vesicles appear normal. Other: Small amount of free pelvic fluid is noted. No inguinal mass or adenopathy. 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 Musculoskeletal: No significant bony findings. Stable lumbar fusion hardware. IMPRESSION: 1. CT findings suggest a good response to treatment. No discernible residual hepatic metastatic lesions are identified. 2. Significant interval reduction in size of the abdominal lymph nodes. The pancreatic lesions difficult to measure exactly but appears relatively stable in size. 3. Stable small  scattered mediastinal and hilar lymph nodes. 4. Stable small scattered subpleural pulmonary nodules, likely benign. 5. 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 Electronically Signed   By: PMarijo SanesM.D.   On: 09/10/2019 10:01      ASSESSMENT & PLAN:  1. Pancreatic cancer metastasized to liver ([place]   2. Encounter for antineoplastic immunotherapy  3. Anemia due to antineoplastic chemotherapy   4. Effusion of right knee   5. Liver metastasis ([place])     #Metastatic pancreatic cancer- liver mets, hilar nodal metastasis, ?bone mets, His CA 19-9 is progressively rising. CT chest abdomen pelvis unfortunately showed disease progression. He has developed several ill-defined low-density hepatic lesions which is compatible with recurrent metastatic disease in the liver. Discussed with patient that his pancreatic cancer now is gemcitabine refractory. Abraxane may still be a sensitive medication however his severe neuropathy limits Abraxane use. For second line treatment, I will suggest immunotherapy with Keytruda. His NGS showed PD-L1 50%, high TMB, indicates her pancreatic cancer may be an unstable tumor and may respond to immunotherapy. MSI can not be completed.  Patient declines genetic testing, BRCA status unknown.   I discussed the mechanism of action and rationale of using immunotherapy.  The goal of therapy is palliative; and length of treatments are likely ongoing/based upon the results of the scans. Discussed the potential side effects of immunotherapy including but not limited to diarrhea; skin rash; respiratory failure, kidney failure, mental status change, elevated LFTs/liver failure,endocrine abnormalities, acute deterioration  and even death,etc. patient voices understanding and agrees with proceeding with treatment.  I attempted to call Mrs. Meints for 2 times and update her.  I was  not able to reach her. Plan lab and Keytruda when this medication is approved by insurance.   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  Continue gabapentin 600 mg 3 times daily.   #Anemia, multifactorial secondary to chemotherapy and other comorbidities. Hemoglobin improved to 9.6.  Continue to monitor. #Right knee effusion/arthritis, status post aspiration.  Continue follow-up with orthopedic surgeon for further management.   He may take ibuprofen 800 mg every 8 hours as needed for pain. All questions were answered. The patient knows to call the clinic with any problems questions or concerns. Follow-up in 3 weeks for cycle 2 Keytruda.  Earlie Server, MD, PhD Hematology Oncology [place] at [facility] 11/14/2019

## 2019-11-14 NOTE — Progress Notes (Signed)
DISCONTINUE ON PATHWAY REGIMEN - Pancreatic Adenocarcinoma     A cycle is every 28 days:     Nab-paclitaxel (protein bound)      Gemcitabine   **Always confirm dose/schedule in your pharmacy ordering system**  REASON: Disease Progression PRIOR TREATMENT: PANOS51: Nab-Paclitaxel (Abraxane) 125 mg/m2 D1, 8, 15 + Gemcitabine 1,000 mg/m2 D1, 8, 15 q28 Days Until Progression or Toxicity TREATMENT RESPONSE: Progressive Disease (PD)  START ON PATHWAY REGIMEN - Pancreatic Adenocarcinoma     A cycle is 21 days:     Pembrolizumab   **Always confirm dose/schedule in your pharmacy ordering system**  Patient Characteristics: Metastatic Disease, Second Line, MSI-H/dMMR Current evidence of distant metastases<= Yes AJCC T Category: T2 AJCC N Category: NX AJCC M Category: M1 AJCC 8 Stage Grouping: IV Line of Therapy: Second Line Microsatellite/Mismatch Repair Status: MSI-H/dMMR Intent of Therapy: Non-Curative / Palliative Intent, Discussed with Patient

## 2019-11-15 ENCOUNTER — Inpatient Hospital Stay (HOSPITAL_BASED_OUTPATIENT_CLINIC_OR_DEPARTMENT_OTHER): Payer: Medicare Other | Admitting: Nurse Practitioner

## 2019-11-15 LAB — CANCER ANTIGEN 19-9: CA 19-9: 1321 U/mL — ABNORMAL HIGH (ref 0–35)

## 2019-11-15 NOTE — Progress Notes (Signed)
appt rescheduled.

## 2019-11-19 ENCOUNTER — Ambulatory Visit: Payer: Medicare Other

## 2019-11-19 ENCOUNTER — Inpatient Hospital Stay (HOSPITAL_BASED_OUTPATIENT_CLINIC_OR_DEPARTMENT_OTHER): Payer: Medicare Other | Admitting: Nurse Practitioner

## 2019-11-19 ENCOUNTER — Inpatient Hospital Stay: Payer: Medicare Other

## 2019-11-19 ENCOUNTER — Other Ambulatory Visit: Payer: Self-pay

## 2019-11-19 VITALS — BP 153/71 | HR 74 | Temp 98.3°F | Resp 18 | Wt 179.0 lb

## 2019-11-19 DIAGNOSIS — C259 Malignant neoplasm of pancreas, unspecified: Secondary | ICD-10-CM

## 2019-11-19 DIAGNOSIS — C787 Secondary malignant neoplasm of liver and intrahepatic bile duct: Secondary | ICD-10-CM

## 2019-11-19 DIAGNOSIS — Z5112 Encounter for antineoplastic immunotherapy: Secondary | ICD-10-CM | POA: Diagnosis not present

## 2019-11-19 LAB — COMPREHENSIVE METABOLIC PANEL
ALT: 15 U/L (ref 0–44)
AST: 18 U/L (ref 15–41)
Albumin: 3.5 g/dL (ref 3.5–5.0)
Alkaline Phosphatase: 60 U/L (ref 38–126)
Anion gap: 9 (ref 5–15)
BUN: 17 mg/dL (ref 8–23)
CO2: 23 mmol/L (ref 22–32)
Calcium: 9.1 mg/dL (ref 8.9–10.3)
Chloride: 104 mmol/L (ref 98–111)
Creatinine, Ser: 0.8 mg/dL (ref 0.61–1.24)
GFR calc Af Amer: >60 mL/min (ref >60)
GFR calc non Af Amer: >60 mL/min (ref >60)
Glucose, Bld: 156 mg/dL — ABNORMAL HIGH (ref 70–99)
Potassium: 3.9 mmol/L (ref 3.5–5.1)
Sodium: 136 mmol/L (ref 135–145)
Total Bilirubin: 0.4 mg/dL (ref 0.3–1.2)
Total Protein: 7.3 g/dL (ref 6.5–8.1)

## 2019-11-19 LAB — CBC WITH DIFFERENTIAL/PLATELET
Abs Immature Granulocytes: 0.04 10*3/uL (ref 0.00–0.07)
Basophils Absolute: 0.1 10*3/uL (ref 0.0–0.1)
Basophils Relative: 1 %
Eosinophils Absolute: 0.2 10*3/uL (ref 0.0–0.5)
Eosinophils Relative: 3 %
HCT: 31.9 % — ABNORMAL LOW (ref 39.0–52.0)
Hemoglobin: 9.5 g/dL — ABNORMAL LOW (ref 13.0–17.0)
Immature Granulocytes: 1 %
Lymphocytes Relative: 14 %
Lymphs Abs: 0.9 10*3/uL (ref 0.7–4.0)
MCH: 28.8 pg (ref 26.0–34.0)
MCHC: 29.8 g/dL — ABNORMAL LOW (ref 30.0–36.0)
MCV: 96.7 fL (ref 80.0–100.0)
Monocytes Absolute: 0.7 10*3/uL (ref 0.1–1.0)
Monocytes Relative: 11 %
Neutro Abs: 4.5 10*3/uL (ref 1.7–7.7)
Neutrophils Relative %: 70 %
Platelets: 488 10*3/uL — ABNORMAL HIGH (ref 150–400)
RBC: 3.3 MIL/uL — ABNORMAL LOW (ref 4.22–5.81)
RDW: 18.3 % — ABNORMAL HIGH (ref 11.5–15.5)
WBC: 6.4 10*3/uL (ref 4.0–10.5)
nRBC: 0 % (ref 0.0–0.2)

## 2019-11-19 LAB — TSH: TSH: 2.359 u[IU]/mL (ref 0.350–4.500)

## 2019-11-19 MED ORDER — HEPARIN SOD (PORK) LOCK FLUSH 100 UNIT/ML IV SOLN
500.0000 [IU] | Freq: Once | INTRAVENOUS | Status: AC
  Administered 2019-11-19: 500 [IU] via INTRAVENOUS
  Filled 2019-11-19: qty 5

## 2019-11-19 MED ORDER — SODIUM CHLORIDE 0.9 % IV SOLN
200.0000 mg | Freq: Once | INTRAVENOUS | Status: AC
  Administered 2019-11-19: 200 mg via INTRAVENOUS
  Filled 2019-11-19: qty 8

## 2019-11-19 MED ORDER — SODIUM CHLORIDE 0.9 % IV SOLN
Freq: Once | INTRAVENOUS | Status: AC
  Filled 2019-11-19: qty 250

## 2019-11-19 MED ORDER — HEPARIN SOD (PORK) LOCK FLUSH 100 UNIT/ML IV SOLN
INTRAVENOUS | Status: AC
  Filled 2019-11-19: qty 5

## 2019-11-19 MED ORDER — SODIUM CHLORIDE 0.9% FLUSH
10.0000 mL | Freq: Once | INTRAVENOUS | Status: AC
  Administered 2019-11-19: 10:00:00 10 mL via INTRAVENOUS
  Filled 2019-11-19: qty 10

## 2019-11-19 NOTE — Progress Notes (Signed)
Virtual Visit via Telephone Note  I connected with Craig [name] on 11/19/19 at  1:00 PM EST by telephone and verified that I am speaking with the correct person using two identifiers.   I discussed the limitations, risks, security and privacy concerns of performing an evaluation and management service by telephone and the availability of in person appointments. I also discussed with the patient that there may be a patient responsible charge related to this service. The patient expressed understanding and agreed to proceed.  History of Present Illness: Craig [name] is a 74 y.o. 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  Patient is being initiated on chemotherapy with gemcitabine and Abraxane.  He was referred to palliative care to help address goals and manage ongoing symptoms.   Observations/Objective: I called patient and we spoke by phone. Prior to visit I reviewed interval progress notes.  He has been seen by orthopedic surgery for swollen right knee and had knee effusion aspirated.  Takes 800 mg ibuprofen daily.  Has bilateral lower extremity neuropathy, currently controlled with gabapentin.  Abraxane is currently held due to worsening neuropathy.  CA 19-9 has been progressively rising.  Imaging was obtained which showed recurrent metastatic disease in the liver.  Second line Keytruda was recommended for PDL positivity.  He declined genetic testing.  He received his first infusion of Keytruda today.  I called the patient and we spoke by phone.  He reports that he is doing well and denies any significant changes or concerns.  Knee pain is improved.  Oral intake is stable and he denies weight loss.  Performance status is the same and unchanged.  He denies any pain.  He says that he is coping as well as can be expected after learning of  progression of his cancer.  He says that the future is "in the Lord's hands".  He does not have advanced directives on file but expresses that his wife would make decisions on his behalf if needed.  We discussed my recommendation to do advance care planning which she is agreeable to.  We will try to coordinate visit when he is in clinic for upcoming appointments.   Assessment and Plan: Stage IV pancreatic cancer -previously on gemcitabine.  Progressed.  Initiated Beryle Flock on 11/19/2019.  Tumor markers are followed.  Follow Up Instructions: Face to face visit for advanced care planning with patient and wife on 12/04/2018. Will try to coordinate with his other appointments that day.    I discussed the assessment and treatment plan with the patient. The patient was provided an opportunity to ask questions and all were answered. The patient agreed with the plan and demonstrated an understanding of the instructions.   The patient was advised to call back or seek an in-person evaluation if the symptoms worsen or if the condition fails to improve as anticipated.  I provided 7 minutes of non-face-to-face time during this encounter.  Verlon Au, NP

## 2019-11-19 NOTE — Progress Notes (Signed)
Nutrition Follow-up:  Patient with metastatic pancreatic cancer to liver.  Patient planning to start pembrolizumab today. Followed by Dr. Tasia Catchings.  Met with patient during infusion.  Patient reports appetite is good.  Never eats breakfast before coming to treatment.  Eats when is wants too.  Reports yesterday ate ham, eggs and toast around 11am and last night ate pinto beans, ham and potatoes.  Drinks ensure plus when he remembers, sometimes 3-4 times per day.  States, I am eating."   Medications: reviewed  Labs: glucose 156  Anthropometrics:   Weight stable at 179 lb today, 179 lb 8 oz on 12/4.  Noted decrease to 176 lb on 1/4  NUTRITION DIAGNOSIS: Inadequate oral intake stable   INTERVENTION:  Encouraged patient to continue eating good sources or protein.  Encouraged 350 calorie shake and coupons given Patient has contact information    MONITORING, EVALUATION, GOAL: Patient will consume adequate calories and protein to prevent weight from decreasing.    NEXT VISIT: phone visit March 15th  [place] Zenia Resides, [place], [place] Registered Dietitian [phone] (pager)

## 2019-11-20 ENCOUNTER — Encounter: Payer: Medicare Other | Admitting: Hospice and Palliative Medicine

## 2019-11-20 LAB — CANCER ANTIGEN 19-9: CA 19-9: 2098 U/mL — ABNORMAL HIGH (ref 0–35)

## 2019-11-20 LAB — T4: T4, Total: 10.2 ug/dL (ref 4.5–12.0)

## 2019-12-01 ENCOUNTER — Other Ambulatory Visit: Payer: Self-pay | Admitting: Physician Assistant

## 2019-12-01 DIAGNOSIS — K219 Gastro-esophageal reflux disease without esophagitis: Secondary | ICD-10-CM

## 2019-12-05 ENCOUNTER — Inpatient Hospital Stay: Payer: Medicare Other

## 2019-12-05 ENCOUNTER — Other Ambulatory Visit: Payer: Self-pay

## 2019-12-05 ENCOUNTER — Inpatient Hospital Stay: Payer: Medicare Other | Admitting: Hospice and Palliative Medicine

## 2019-12-05 ENCOUNTER — Encounter: Payer: Self-pay | Admitting: Oncology

## 2019-12-05 ENCOUNTER — Inpatient Hospital Stay (HOSPITAL_BASED_OUTPATIENT_CLINIC_OR_DEPARTMENT_OTHER): Payer: Medicare Other | Admitting: Oncology

## 2019-12-05 ENCOUNTER — Inpatient Hospital Stay: Payer: Medicare Other | Attending: Oncology

## 2019-12-05 VITALS — BP 162/78 | HR 76 | Temp 97.7°F | Resp 18 | Wt 178.4 lb

## 2019-12-05 DIAGNOSIS — Z95828 Presence of other vascular implants and grafts: Secondary | ICD-10-CM

## 2019-12-05 DIAGNOSIS — Z5112 Encounter for antineoplastic immunotherapy: Secondary | ICD-10-CM | POA: Diagnosis not present

## 2019-12-05 DIAGNOSIS — T451X5A Adverse effect of antineoplastic and immunosuppressive drugs, initial encounter: Secondary | ICD-10-CM

## 2019-12-05 DIAGNOSIS — G629 Polyneuropathy, unspecified: Secondary | ICD-10-CM | POA: Diagnosis not present

## 2019-12-05 DIAGNOSIS — D6481 Anemia due to antineoplastic chemotherapy: Secondary | ICD-10-CM

## 2019-12-05 DIAGNOSIS — M25461 Effusion, right knee: Secondary | ICD-10-CM

## 2019-12-05 DIAGNOSIS — C787 Secondary malignant neoplasm of liver and intrahepatic bile duct: Secondary | ICD-10-CM | POA: Diagnosis present

## 2019-12-05 DIAGNOSIS — C259 Malignant neoplasm of pancreas, unspecified: Secondary | ICD-10-CM | POA: Insufficient documentation

## 2019-12-05 LAB — COMPREHENSIVE METABOLIC PANEL
ALT: 15 U/L (ref 0–44)
AST: 18 U/L (ref 15–41)
Albumin: 3.7 g/dL (ref 3.5–5.0)
Alkaline Phosphatase: 62 U/L (ref 38–126)
Anion gap: 8 (ref 5–15)
BUN: 21 mg/dL (ref 8–23)
CO2: 23 mmol/L (ref 22–32)
Calcium: 9.1 mg/dL (ref 8.9–10.3)
Chloride: 108 mmol/L (ref 98–111)
Creatinine, Ser: 0.79 mg/dL (ref 0.61–1.24)
GFR calc Af Amer: >60 mL/min (ref >60)
GFR calc non Af Amer: >60 mL/min (ref >60)
Glucose, Bld: 158 mg/dL — ABNORMAL HIGH (ref 70–99)
Potassium: 4 mmol/L (ref 3.5–5.1)
Sodium: 139 mmol/L (ref 135–145)
Total Bilirubin: 0.4 mg/dL (ref 0.3–1.2)
Total Protein: 7.6 g/dL (ref 6.5–8.1)

## 2019-12-05 LAB — CBC WITH DIFFERENTIAL/PLATELET
Abs Immature Granulocytes: 0.03 10*3/uL (ref 0.00–0.07)
Basophils Absolute: 0.1 10*3/uL (ref 0.0–0.1)
Basophils Relative: 1 %
Eosinophils Absolute: 0.4 10*3/uL (ref 0.0–0.5)
Eosinophils Relative: 6 %
HCT: 33.4 % — ABNORMAL LOW (ref 39.0–52.0)
Hemoglobin: 10.1 g/dL — ABNORMAL LOW (ref 13.0–17.0)
Immature Granulocytes: 1 %
Lymphocytes Relative: 19 %
Lymphs Abs: 1.2 10*3/uL (ref 0.7–4.0)
MCH: 28.8 pg (ref 26.0–34.0)
MCHC: 30.2 g/dL (ref 30.0–36.0)
MCV: 95.2 fL (ref 80.0–100.0)
Monocytes Absolute: 0.8 10*3/uL (ref 0.1–1.0)
Monocytes Relative: 12 %
Neutro Abs: 4.1 10*3/uL (ref 1.7–7.7)
Neutrophils Relative %: 61 %
Platelets: 188 10*3/uL (ref 150–400)
RBC: 3.51 MIL/uL — ABNORMAL LOW (ref 4.22–5.81)
RDW: 16.6 % — ABNORMAL HIGH (ref 11.5–15.5)
WBC: 6.6 10*3/uL (ref 4.0–10.5)
nRBC: 0 % (ref 0.0–0.2)

## 2019-12-05 LAB — TSH: TSH: 1.867 u[IU]/mL (ref 0.350–4.500)

## 2019-12-05 MED ORDER — HEPARIN SOD (PORK) LOCK FLUSH 100 UNIT/ML IV SOLN
500.0000 [IU] | Freq: Once | INTRAVENOUS | Status: AC
  Administered 2019-12-05: 500 [IU] via INTRAVENOUS
  Filled 2019-12-05: qty 5

## 2019-12-05 MED ORDER — SODIUM CHLORIDE 0.9% FLUSH
10.0000 mL | Freq: Once | INTRAVENOUS | Status: AC
  Administered 2019-12-05: 13:00:00 10 mL via INTRAVENOUS
  Filled 2019-12-05: qty 10

## 2019-12-05 NOTE — Progress Notes (Signed)
Hematology/Oncology  Follow up note [facility] [phone] Fax:([phone]) [phone]   Patient Care Team: Paulene Floor as PCP - General (Physician Assistant) Clent Jacks, RN as Oncology Nurse Navigator Earlie Server, MD as Consulting Physician (Hematology and Oncology)  REFERRING PROVIDER: Trinna Post, PA-C  CHIEF COMPLAINTS/REASON FOR VISIT:  Follow up for treatment of pancreatic cancer  HISTORY OF PRESENTING ILLNESS:   Craig [name] is a  74 y.o.  male with PMH listed below, including CABG x5, PVD, hypertension, former tobacco abuse, former alcohol abuse, iron deficiency anemia, was seen in consultation at the request of  Craig [name], Adriana M, PA-C  for evaluation of abnormal CT Patient recently presented to primary care provider complaining for abdominal pain radiating to his back for about 10 days.  Patient uses Tylenol as needed for pain. Work-up showed elevated amylase, lipase, mild anemia with hemoglobin of 11.3 Acute hepatitis panel negative. 05/16/2019 CT abdomen pelvis with contrast showed poorly marginated heterogeneous hypodense 2.9 cm pancreatic mass at the uncinate process, worrisome for primary pancreatic cancer.  No biliary or pancreatic duct dilation. Several ill defined hypodense liver masses scattered throughout the liver, largest 3.1 cm in the segment 6 right liver lobe. Nonspecific portacaval and aortocaval adenopathy. Extreme medial left lung base 4.4 cm pleural-based mass probably a pleural metastasis.  Additional tiny pulmonary nodules scattered at the right lung base are indeterminate. Mild prostate or megaly  Patient was referred to heme-onc for further evaluation. Patient reports that his abdominal pain was 10 out of 10 a few days ago, he uses Tylenol as needed. Today his pain is getting better, 2 out of 10.  Denies any nausea, vomiting, diarrhea, fever or chills. Married, lives with wife.  Appetite is good.  He  weighs 195 pounds today at the clinic. His weight was 204 pounds back in May 2020.  He had a history of 37.5-pack-year smoking history quitting 2018. No longer drinking alcohol.  # ultrasound-guided liver biopsy on 05/21/2019.  Pathology showed fragments of benign hepatic parenchyma showing minimal macro vascular steatosis, otherwise no significant histo pathologic change. Single core of renal tissue showing approximately 25% glomerulosclerosis  # #History of CAD, status post CABG x5.  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  # Omniseq test showed PD-L1 50% TPS, TMB high, negative for BRCA1/2 or NTRK fusion.  MSI could not be completed.  Patient declines genetic testing  INTERVAL HISTORY [name] is a 74 y.o. male who has above history reviewed by me today presents for evaluation prior to chemotherapy for metastatic pancreatic cancer.   Problems and complaints are listed below: Patient has been on palliative treatments with Keytruda. Patient reports feeling well and tolerates first cycle of Keytruda 3 weeks ago. Right knee pain has improved. He has chronic bilateral lower extremity neuropathy, admits not being compliant with gabapentin. He reports some numbness and soreness on his toes. Appetite is good. Weight is stable.  Denies any rash, shortness of breath, skin rash  . [name] Kitchen Review of Systems  Constitutional: Positive for fatigue. Negative for appetite change, chills, fever and unexpected weight change.  HENT:   Negative for hearing loss and voice change.   Eyes: Negative for eye problems and icterus.  Respiratory: Negative for chest tightness, cough and shortness of breath.   Cardiovascular: Negative for chest pain and leg swelling.  Gastrointestinal: Negative for abdominal distention and  abdominal pain.  Endocrine: Negative for hot flashes.    Genitourinary: Negative for difficulty urinating, dysuria and frequency.   Musculoskeletal: Negative for arthralgias.  Skin: Negative for itching and rash.  Neurological: Positive for numbness. Negative for light-headedness.  Hematological: Negative for adenopathy. Does not bruise/bleed easily.  Psychiatric/Behavioral: Negative for confusion.    MEDICAL HISTORY:  Past Medical History:  Diagnosis Date  . Allergic rhinitis   . Cancer [facility])    new dx Pancreatic Cancer - Aug 2020  . CHF (congestive heart failure) ([place])   . Chronic cholecystitis   . Coronary artery disease   . DDD (degenerative disc disease), cervical   . Dehydration 06/21/2019  . Dyspnea   . Early cataract   . Erectile dysfunction   . GERD (gastroesophageal reflux disease)   . Gouty arthritis   . Headache    occasional migraines  . Hypercalcemia   . Hyperlipemia   . Hypertension   . Palpitations   . Peripheral vascular disease ([place])   . 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   LIMA to LAD SVG to [place] SVG SEQUENTIALLY to OM1 and OM2 SVG to ACUTE MARGINAL  . Spinal stenosis of cervical region   . Vitamin D deficiency     SURGICAL HISTORY: Past Surgical History:  Procedure Laterality Date  . BACK SURGERY  2018    fusion with screws  . CHOLECYSTECTOMY N/A 11/13/2018   Procedure: LAPAROSCOPIC CHOLECYSTECTOMY;  Surgeon: Vickie Epley, MD;  Location: [facility] ORS;  Service: General;  Laterality: N/A;  . COLONOSCOPY    . CORONARY ARTERY BYPASS GRAFT N/A 09/30/2017   Procedure: CORONARY ARTERY BYPASS GRAFTING times five using right and left Saphaneous vein harvested endoscopicly  and left internal mammary artery. (CABG),TEE;  Surgeon: Rexene Alberts, MD;  Location: [facility];  Service: Open Heart Surgery;  Laterality: N/A;  . LEFT HEART CATH AND CORONARY ANGIOGRAPHY Left 09/06/2017   Procedure: LEFT HEART CATH AND CORONARY ANGIOGRAPHY;  Surgeon: Corey Skains, MD;  Location: [facility];  Service:  Cardiovascular;  Laterality: Left;  . percutaneous transluminal balloon angioplasty  01/2010   of left lower extremity  . PORTA CATH INSERTION N/A 06/06/2019   Procedure: PORTA CATH INSERTION;  Surgeon: Katha Cabal, MD;  Location: [place] [facility];  Service: Cardiovascular;  Laterality: N/A;  . TEE WITHOUT CARDIOVERSION N/A 09/30/2017   Procedure: TRANSESOPHAGEAL ECHOCARDIOGRAM (TEE);  Surgeon: Rexene Alberts, MD;  Location: [facility];  Service: Open Heart Surgery;  Laterality: N/A;  . VASCULAR SURGERY Left 2010   left exernal iliac and superficial femoral artery PTCA and stenting    SOCIAL HISTORY: Social History   Socioeconomic History  . Marital status: Married    Spouse name: Enid Derry  . Number of children: 2  . Years of education: Not on file  . Highest education level: Not on file  Occupational History  . Occupation: drove tractors    Comment: retired  Tobacco Use  . Smoking status: Former Smoker    Packs/day: 0.75    Years: 50.00    Pack years: 37.50    Types: Cigarettes    Quit date: 09/27/2017    Years since quitting: 2.1  . Smokeless tobacco: Former Systems developer    Types: Chew  Substance and Sexual Activity  . Alcohol use: Not Currently    Comment: stopped drinking before cabg  . Drug use: Not Currently    Types: Marijuana    Comment: stopped smoking before CABG  .  Sexual activity: Not Currently  Other Topics Concern  . Not on file  Social History Narrative  . Not on file   Social Determinants of Health   Financial Resource Strain: Low Risk   . Difficulty of Paying Living Expenses: Not hard at all  Food Insecurity: No Food Insecurity  . Worried About Charity fundraiser in the Last Year: Never true  . Ran Out of Food in the Last Year: Never true  Transportation Needs: No Transportation Needs  . Lack of Transportation (Medical): No  . Lack of Transportation (Non-Medical): No  Physical Activity: Sufficiently Active  . Days of Exercise per Week: 5 days    . Minutes of Exercise per Session: 150+ min  Stress: No Stress Concern Present  . Feeling of Stress : Not at all  Social Connections: Somewhat Isolated  . Frequency of Communication with Friends and Family: More than three times a week  . Frequency of Social Gatherings with Friends and Family: More than three times a week  . Attends Religious Services: Never  . Active Member of Clubs or Organizations: No  . Attends Archivist Meetings: Never  . Marital Status: Married  Human resources officer Violence:   . Fear of Current or Ex-Partner: Not on file  . Emotionally Abused: Not on file  . Physically Abused: Not on file  . Sexually Abused: Not on file    FAMILY HISTORY: Family History  Problem Relation Age of Onset  . Brain cancer Mother   . Emphysema Father   . Cancer Brother     ALLERGIES:  is allergic to lipitor [atorvastatin]; zetia [ezetimibe]; lisinopril; protonix [pantoprazole]; and spironolactone.  MEDICATIONS:  Current Outpatient Medications  Medication Sig Dispense Refill  . aspirin EC 81 MG tablet Take 81 mg by mouth daily.    . carvedilol (COREG) 6.25 MG tablet Take 6.25 mg by mouth 2 (two) times daily with a meal.  3  . diltiazem (CARDIZEM CD) 300 MG 24 hr capsule TAKE 1 CAPSULE BY MOUTH EVERY DAY 90 capsule 1  . dorzolamide-timolol (COSOPT) 22.3-6.8 MG/ML ophthalmic solution INSTILL ONE DROP INTO BOTH EYES TWICE DAILY    . ferrous sulfate 325 (65 FE) MG tablet TAKE 1 TABLET BY MOUTH EVERY DAY WITH BREAKFAST 90 tablet 1  . gabapentin (NEURONTIN) 300 MG capsule TAKE 2 CAPSULES (600 MG TOTAL) BY MOUTH 3 (THREE) TIMES DAILY. 540 capsule 0  . ibuprofen (ADVIL) 800 MG tablet Take 1 tablet (800 mg total) by mouth every 8 (eight) hours as needed. 30 tablet 0  . latanoprost (XALATAN) 0.005 % ophthalmic solution Place 1 drop into both eyes at bedtime.   3  . lidocaine-prilocaine (EMLA) cream APPLY TO AFFECTED AREA ONCE AS DIRECTED    . Multiple Vitamin (MULTIVITAMIN WITH  MINERALS) TABS tablet Take 1 tablet by mouth daily.    . OMEGA-3 FATTY ACIDS PO Take 1,200 mg by mouth daily.     [name] Kitchen omeprazole (PRILOSEC) 40 MG capsule TAKE 1 CAPSULE BY MOUTH EVERY DAY 90 capsule 1   No current facility-administered medications for this visit.   Facility-Administered Medications Ordered in Other Visits  Medication Dose Route Frequency Provider Last Rate Last Admin  . sodium chloride flush (NS) 0.9 % injection 10 mL  10 mL Intracatheter PRN Earlie Server, MD      . sodium chloride flush (NS) 0.9 % injection 10 mL  10 mL Intravenous Once Earlie Server, MD         PHYSICAL EXAMINATION: ECOG  PERFORMANCE STATUS: 1 - Symptomatic but completely ambulatory Vitals:   12/05/19 1314  BP: (!) 162/78  Pulse: 76  Resp: 18  Temp: 97.7 F (36.5 C)   Filed Weights   12/05/19 1314  Weight: 178 lb 6.4 oz (80.9 kg)    Physical Exam Constitutional:      General: He is not in acute distress. HENT:     Head: Normocephalic and atraumatic.  Eyes:     General: No scleral icterus. Cardiovascular:     Rate and Rhythm: Normal rate and regular rhythm.     Heart sounds: Normal heart sounds.  Pulmonary:     Effort: Pulmonary effort is normal. No respiratory distress.     Breath sounds: No wheezing.  Abdominal:     General: Bowel sounds are normal. There is no distension.     Palpations: Abdomen is soft.  Musculoskeletal:        General: No deformity. Normal range of motion.     Cervical back: Normal range of motion and neck supple.  Skin:    General: Skin is warm and dry.     Findings: No erythema or rash.  Neurological:     Mental Status: He is alert and oriented to person, place, and time. Mental status is at baseline.     Cranial Nerves: No cranial nerve deficit.     Coordination: Coordination normal.  Psychiatric:        Mood and Affect: Mood normal.     LABORATORY DATA:  I have reviewed the data as listed Lab Results  Component Value Date   WBC 6.6 12/05/2019   HGB 10.1  (L) 12/05/2019   HCT 33.4 (L) 12/05/2019   MCV 95.2 12/05/2019   PLT 188 12/05/2019   Recent Labs    11/14/19 0834 11/19/19 0918 12/05/19 1255  NA 137 136 139  K 4.1 3.9 4.0  CL 105 104 108  CO2 '23 23 23  '$ GLUCOSE 131* 156* 158*  BUN '19 17 21  '$ CREATININE 0.83 0.80 0.79  CALCIUM 9.2 9.1 9.1  GFRNONAA >60 >60 >60  GFRAA >60 >60 >60  PROT 7.2 7.3 7.6  ALBUMIN 3.3* 3.5 3.7  AST '17 18 18  '$ ALT '20 15 15  '$ ALKPHOS 62 60 62  BILITOT 0.4 0.4 0.4   Iron/TIBC/Ferritin/ %Sat    Component Value Date/Time   IRON 119 04/27/2019 0940   TIBC 363 04/27/2019 0940   FERRITIN 58 04/27/2019 0940   IRONPCTSAT 33 04/27/2019 0940      RADIOGRAPHIC STUDIES: I have personally reviewed the radiological images as listed and agreed with the findings in the report. DG Knee 1-2 Views Right  Result Date: 10/15/2019 CLINICAL DATA:  Lateral knee pain and swelling with difficulty walking. No known injury. History of colon cancer. EXAM: RIGHT KNEE - 1-2 VIEW COMPARISON:  None. FINDINGS: The mineralization and alignment are normal. There is no evidence of acute fracture or dislocation. The joint spaces are relatively preserved. There is fragmentation of the patella superolaterally consistent with a bipartite patella (normal variant). There is a moderate size knee joint effusion. Femoropopliteal atherosclerosis is noted with vascular clips medially in the lower leg. IMPRESSION: 1. No acute osseous findings. 2. Moderate size knee joint effusion. 3. Bipartite patella. Electronically Signed   By: Richardean Sale M.D.   On: 10/15/2019 14:31   CT Chest W Contrast  Result Date: 11/05/2019 CLINICAL DATA:  Pancreatic cancer. Follow up for treatment response. EXAM: CT CHEST, ABDOMEN, AND PELVIS WITH CONTRAST  TECHNIQUE: Multidetector CT imaging of the chest, abdomen and pelvis was performed following the standard protocol during bolus administration of intravenous contrast. CONTRAST:  170m OMNIPAQUE IOHEXOL 300 MG/ML  SOLN  COMPARISON:  Prior CTs 05/16/2019 and 09/10/2019. PET-CT 06/04/2019. FINDINGS: CT CHEST FINDINGS Cardiovascular: Diffuse atherosclerosis of the aorta, great vessels and coronary arteries status post median sternotomy and CABG. No acute vascular findings are evident. Right IJ Port-A-Cath extends to the superior cavoatrial junction. Probable aortic valvular calcifications. The heart size is normal. There is no pericardial effusion. Mediastinum/Nodes: There are no enlarged mediastinal, hilar or axillary lymph nodes. Small mediastinal lymph nodes are stable. There is stable minimal nodularity of the thyroid gland, consistent with a benign etiology. The trachea and esophagus appear normal. Lungs/Pleura: No pleural effusion or pneumothorax. Pleural based mass involving the left hemidiaphragm medially is grossly stable, measuring 4.2 x 2.8 cm on image 47/2. This was not hypermetabolic on PET-CT. Scattered tiny subpleural nodules are stable, likely benign. No new or enlarging pulmonary nodules. Musculoskeletal/Chest wall: No chest wall mass or suspicious osseous findings. There are stable degenerative changes within the thoracic spine and stable mild anterior wedging of the T5 vertebral body. Previous median sternotomy. CT ABDOMEN AND PELVIS FINDINGS Hepatobiliary: There are several ill-defined low-density hepatic lesions which are inapparent on the most recent study, suspicious for recurrent metastatic disease. Largest lesion posteriorly in the dome of the right hepatic lobe may reflect 2 adjacent lesions, measuring 2.1 x 1.2 cm on image 51/2. There is a smaller lesion more anteriorly in the right hepatic lobe measuring 1.1 cm on image 51/2. There is a 1.1 cm lesion inferiorly in the right lobe on image 62/2. Previous cholecystectomy. No significant biliary dilatation. Pancreas: The previously demonstrated low-density mass projecting medially from the uncinate process has resolved. There is stable isodense prominence of  the pancreatic head compared with the most recent study, measuring approximately 3.6 x 2.2 cm on image 66/2. No pancreatic ductal dilatation or surrounding inflammation. Spleen: Normal in size without focal abnormality. Adrenals/Urinary Tract: Both adrenal glands appear normal. There are bilateral renal cysts, largest in the lower pole of the right kidney, measuring 4.0 cm. In the lower pole of the left kidney, there is in 9 mm lesion (image 32/7) which is slightly denser than on the previous study, but unchanged in size, probably a complex cyst. No evidence of urinary tract calculus or hydronephrosis. The bladder appears unremarkable for its degree of distention. Stomach/Bowel: No evidence of bowel wall thickening, distention or surrounding inflammatory change. Chronic gastric wall thickening, similar to previous study. Vascular/Lymphatic: There are no enlarged abdominal or pelvic lymph nodes. Stable small lymph nodes in the porta hepatis. Aortic and branch vessel atherosclerosis. No acute vascular findings. Reproductive: Stable mild enlargement of the prostate gland. Other: Intact anterior abdominal wall. No ascites or peritoneal nodularity. Musculoskeletal: No acute or significant osseous findings. Previous L4-5 fusion. IMPRESSION: 1. Several ill-defined low-density hepatic lesions are inapparent on the most recent study, suspicious for recurrent metastatic disease. Consider further evaluation with abdominal MRI or PET-CT. 2. Grossly stable appearance of the pancreatic head without evidence of local recurrence. No recurrent adenopathy in the chest or abdomen. 3. Stable small pulmonary nodules bilaterally consistent with a benign etiology. 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 Electronically Signed   By: WRichardean SaleM.D.   On: 11/05/2019 11:46   CT Chest W Contrast  Result Date: 09/10/2019 CLINICAL DATA:  Restaging metastatic pancreatic cancer. EXAM: CT CHEST, ABDOMEN, AND PELVIS WITH CONTRAST TECHNIQUE: Multidetector CT imaging of the chest, abdomen and pelvis was performed following the standard protocol during bolus administration of intravenous contrast. CONTRAST:  181m OMNIPAQUE IOHEXOL 300 MG/ML  SOLN COMPARISON:  PET-CT 06/04/2019 FINDINGS: CT CHEST FINDINGS Cardiovascular: The heart is normal in size. No pericardial effusion. Stable tortuosity and advanced atherosclerotic calcifications involving the thoracic aorta. 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 Stable advanced three-vessel coronary artery calcifications. Normal appearance of the pulmonary arteries. Mediastinum/Nodes: Stable small scattered mediastinal and hilar lymph nodes. 7.5 mm right paratracheal node on image 11/504. 6 mm right paratracheal node on image 18/504. 5 mm prevascular lymph node on image 18/504. 6.5 mm subcarinal lymph node on image 26/504. The esophagus is grossly normal. Stable small scattered thyroid nodules. Lungs/Pleura: Stable emphysematous changes. No acute pulmonary findings. A few tiny scattered subpleural pulmonary nodules are stable and likely benign. Musculoskeletal: No chest wall masses are identified. Right-sided Port-A-Cath is stable. No supraclavicular or axillary adenopathy. No worrisome bone lesions to suggest metastatic disease. CT ABDOMEN PELVIS FINDINGS Hepatobiliary: No hepatic lesions are identified. The small hepatic metastatic lesions have resolved. No new lesions. The portal and hepatic veins are patent. The gallbladder is surgically absent. No common bile duct dilatation. Pancreas: Ill-defined lesion in the lower pancreatic head/uncinate process measures approximately 3 x 3 cm and appears relatively stable. The pancreatic body and tail appear normal. Spleen: Normal size.  No focal lesions. Adrenals/Urinary Tract: The adrenal glands and kidneys are unremarkable. Stable renal cysts. The  bladder is unremarkable. Stomach/Bowel: The stomach, duodenum, small bowel and colon are grossly normal. No acute inflammatory changes, mass lesions or obstructive findings. The terminal ileum and appendix are normal. Vascular/Lymphatic: Stable advanced atherosclerotic calcifications involving the aorta, iliac arteries and branch vessels. No aneurysm or dissection. Marked interval reduction in size of the hypermetabolic upper abdominal lymph nodes. 7 mm node between the portal vein and IVC on image 56/504 previously measured 14.5 mm. 5.5 mm interaortocaval lymph node on image 65/504 previously measured 10.5 cm. Reproductive: Enlarged prostate gland with median lobe hypertrophy impressing on the base of the bladder. The seminal vesicles appear normal. Other: Small amount of free pelvic fluid is noted. No inguinal mass or adenopathy. 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 Musculoskeletal: No significant bony findings. Stable lumbar fusion hardware. IMPRESSION: 1. CT findings suggest a good response to treatment. No discernible residual hepatic metastatic lesions are identified. 2. Significant interval reduction in size of the abdominal lymph nodes. The pancreatic lesions difficult to measure exactly but appears relatively stable in size. 3. Stable small scattered mediastinal and hilar lymph nodes. 4. Stable small scattered subpleural pulmonary nodules, likely benign. 5. 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 Electronically Signed   By: PMarijo SanesM.D.   On: 09/10/2019 10:01   CT Abdomen Pelvis W Contrast  Result Date: 11/05/2019 CLINICAL DATA:  Pancreatic cancer. Follow up for treatment response. EXAM: CT CHEST, ABDOMEN, AND PELVIS WITH CONTRAST TECHNIQUE: Multidetector CT imaging of the  chest, abdomen and pelvis was performed following the standard protocol during bolus administration of intravenous contrast. CONTRAST:  1015mOMNIPAQUE IOHEXOL 300 MG/ML  SOLN COMPARISON:  Prior CTs 05/16/2019 and 09/10/2019. PET-CT 06/04/2019. FINDINGS: CT  CHEST FINDINGS Cardiovascular: Diffuse atherosclerosis of the aorta, great vessels and coronary arteries status post median sternotomy and CABG. No acute vascular findings are evident. Right IJ Port-A-Cath extends to the superior cavoatrial junction. Probable aortic valvular calcifications. The heart size is normal. There is no pericardial effusion. Mediastinum/Nodes: There are no enlarged mediastinal, hilar or axillary lymph nodes. Small mediastinal lymph nodes are stable. There is stable minimal nodularity of the thyroid gland, consistent with a benign etiology. The trachea and esophagus appear normal. Lungs/Pleura: No pleural effusion or pneumothorax. Pleural based mass involving the left hemidiaphragm medially is grossly stable, measuring 4.2 x 2.8 cm on image 47/2. This was not hypermetabolic on PET-CT. Scattered tiny subpleural nodules are stable, likely benign. No new or enlarging pulmonary nodules. Musculoskeletal/Chest wall: No chest wall mass or suspicious osseous findings. There are stable degenerative changes within the thoracic spine and stable mild anterior wedging of the T5 vertebral body. Previous median sternotomy. CT ABDOMEN AND PELVIS FINDINGS Hepatobiliary: There are several ill-defined low-density hepatic lesions which are inapparent on the most recent study, suspicious for recurrent metastatic disease. Largest lesion posteriorly in the dome of the right hepatic lobe may reflect 2 adjacent lesions, measuring 2.1 x 1.2 cm on image 51/2. There is a smaller lesion more anteriorly in the right hepatic lobe measuring 1.1 cm on image 51/2. There is a 1.1 cm lesion inferiorly in the right lobe on image 62/2. Previous cholecystectomy. No significant  biliary dilatation. Pancreas: The previously demonstrated low-density mass projecting medially from the uncinate process has resolved. There is stable isodense prominence of the pancreatic head compared with the most recent study, measuring approximately 3.6 x 2.2 cm on image 66/2. No pancreatic ductal dilatation or surrounding inflammation. Spleen: Normal in size without focal abnormality. Adrenals/Urinary Tract: Both adrenal glands appear normal. There are bilateral renal cysts, largest in the lower pole of the right kidney, measuring 4.0 cm. In the lower pole of the left kidney, there is in 9 mm lesion (image 32/7) which is slightly denser than on the previous study, but unchanged in size, probably a complex cyst. No evidence of urinary tract calculus or hydronephrosis. The bladder appears unremarkable for its degree of distention. Stomach/Bowel: No evidence of bowel wall thickening, distention or surrounding inflammatory change. Chronic gastric wall thickening, similar to previous study. Vascular/Lymphatic: There are no enlarged abdominal or pelvic lymph nodes. Stable small lymph nodes in the porta hepatis. Aortic and branch vessel atherosclerosis. No acute vascular findings. Reproductive: Stable mild enlargement of the prostate gland. Other: Intact anterior abdominal wall. No ascites or peritoneal nodularity. Musculoskeletal: No acute or significant osseous findings. Previous L4-5 fusion. IMPRESSION: 1. Several ill-defined low-density hepatic lesions are inapparent on the most recent study, suspicious for recurrent metastatic disease. Consider further evaluation with abdominal MRI or PET-CT. 2. Grossly stable appearance of the pancreatic head without evidence of local recurrence. No recurrent adenopathy in the chest or abdomen. 3. Stable small pulmonary nodules bilaterally consistent with a benign etiology. 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 Electronically Signed   By: Richardean Sale M.D.   On: 11/05/2019 11:46   CT Abdomen Pelvis W Contrast  Result Date: 09/10/2019 CLINICAL DATA:  Restaging metastatic pancreatic cancer. EXAM: CT CHEST, ABDOMEN, AND PELVIS WITH CONTRAST TECHNIQUE: Multidetector CT imaging of the chest, abdomen and pelvis was performed following the standard protocol during bolus administration of intravenous contrast. CONTRAST:  167m OMNIPAQUE IOHEXOL 300  MG/ML  SOLN COMPARISON:  PET-CT 06/04/2019 FINDINGS: CT CHEST FINDINGS Cardiovascular: The heart is normal in size. No pericardial effusion. Stable tortuosity and advanced atherosclerotic calcifications involving the thoracic aorta. 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 Stable advanced three-vessel coronary artery calcifications. Normal appearance of the pulmonary arteries. Mediastinum/Nodes: Stable small scattered mediastinal and hilar lymph nodes. 7.5 mm right paratracheal node on image 11/504. 6 mm right paratracheal node on image 18/504. 5 mm prevascular lymph node on image 18/504. 6.5 mm subcarinal lymph node on image 26/504. The esophagus is grossly normal. Stable small scattered thyroid nodules. Lungs/Pleura: Stable emphysematous changes. No acute pulmonary findings. A few tiny scattered subpleural pulmonary nodules are stable and likely benign. Musculoskeletal: No chest wall masses are identified. Right-sided Port-A-Cath is stable. No supraclavicular or axillary adenopathy. No worrisome bone lesions to suggest metastatic disease. CT ABDOMEN PELVIS FINDINGS Hepatobiliary: No hepatic lesions are identified. The small hepatic metastatic lesions have resolved. No new lesions. The portal and hepatic veins are patent. The gallbladder is surgically absent. No common bile duct dilatation. Pancreas: Ill-defined lesion in the lower pancreatic head/uncinate process measures approximately 3 x 3 cm and appears relatively stable. The  pancreatic body and tail appear normal. Spleen: Normal size.  No focal lesions. Adrenals/Urinary Tract: The adrenal glands and kidneys are unremarkable. Stable renal cysts. The bladder is unremarkable. Stomach/Bowel: The stomach, duodenum, small bowel and colon are grossly normal. No acute inflammatory changes, mass lesions or obstructive findings. The terminal ileum and appendix are normal. Vascular/Lymphatic: Stable advanced atherosclerotic calcifications involving the aorta, iliac arteries and branch vessels. No aneurysm or dissection. Marked interval reduction in size of the hypermetabolic upper abdominal lymph nodes. 7 mm node between the portal vein and IVC on image 56/504 previously measured 14.5 mm. 5.5 mm interaortocaval lymph node on image 65/504 previously measured 10.5 cm. Reproductive: Enlarged prostate gland with median lobe hypertrophy impressing on the base of the bladder. The seminal vesicles appear normal. Other: Small amount of free pelvic fluid is noted. No inguinal mass or adenopathy. 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 Musculoskeletal: No significant bony findings. Stable lumbar fusion hardware. IMPRESSION: 1. CT findings suggest a good response to treatment. No discernible residual hepatic metastatic lesions are identified. 2. Significant interval reduction in size of the abdominal lymph nodes. The pancreatic lesions difficult to measure exactly but appears relatively stable in size. 3. Stable small scattered mediastinal and hilar lymph nodes. 4. Stable small scattered subpleural pulmonary nodules, likely benign. 5. 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 Electronically Signed   By: Marijo Sanes M.D.   On: 09/10/2019 10:01      ASSESSMENT & PLAN:  1. Pancreatic  cancer metastasized to liver ([place])   2. Encounter for antineoplastic immunotherapy   3. Anemia due to antineoplastic chemotherapy   4. Effusion of right knee   5. Neuropathy     #Metastatic pancreatic cancer- liver mets, hilar nodal metastasis, ?bone mets, His CA 19-9 is pending at the time of dictation. Status post 1 cycle of Keytruda.  Tolerates well. Labs reviewed and discussed with patient okay to proceed with cycle 2 Keytruda 12/10/2019 Discussed with patient that I will order CT after he finishes 4 cycles of immunotherapy.  #Anemia secondary to antineoplastic treatments.  Hemoglobin has improved while off chemotherapy.  #Pre-existing neuropathy, discussed with patient that I recommend him to continue take gabapentin 600 mg 3 times daily. #Right knee effusion, improved.  Pain has resolved.  All questions were answered. The patient knows to call the clinic with any problems questions or concerns. Follow-up in 3 weeks for cycle 3 Keytruda.  Earlie Server, MD, PhD Hematology Oncology [place] [facility] 12/05/2019

## 2019-12-05 NOTE — Progress Notes (Signed)
Patient reports issues with since treatment and now is having toe soreness.  That is sometimes painful with toe movement.

## 2019-12-06 ENCOUNTER — Telehealth: Payer: Self-pay

## 2019-12-06 NOTE — Telephone Encounter (Signed)
-----   Message from Earlie Server, MD sent at 12/05/2019  7:47 PM EST ----- No need for Lab on 12/10/2019, he can just proceed with immunotherapy. Thanks.

## 2019-12-06 NOTE — Telephone Encounter (Signed)
Lab appointment has been cancelled.

## 2019-12-09 ENCOUNTER — Other Ambulatory Visit: Payer: Self-pay | Admitting: Physician Assistant

## 2019-12-09 DIAGNOSIS — R5383 Other fatigue: Secondary | ICD-10-CM

## 2019-12-09 DIAGNOSIS — D649 Anemia, unspecified: Secondary | ICD-10-CM

## 2019-12-09 LAB — CANCER ANTIGEN 19-9: CA 19-9: 7161 U/mL — ABNORMAL HIGH (ref 0–35)

## 2019-12-10 ENCOUNTER — Inpatient Hospital Stay: Payer: Medicare Other | Attending: Oncology

## 2019-12-10 ENCOUNTER — Other Ambulatory Visit: Payer: Self-pay

## 2019-12-10 ENCOUNTER — Inpatient Hospital Stay: Payer: Medicare Other

## 2019-12-10 VITALS — BP 159/78 | HR 73 | Temp 97.0°F | Resp 18 | Wt 179.4 lb

## 2019-12-10 DIAGNOSIS — I700 Atherosclerosis of aorta: Secondary | ICD-10-CM | POA: Insufficient documentation

## 2019-12-10 DIAGNOSIS — K760 Fatty (change of) liver, not elsewhere classified: Secondary | ICD-10-CM | POA: Diagnosis not present

## 2019-12-10 DIAGNOSIS — C259 Malignant neoplasm of pancreas, unspecified: Secondary | ICD-10-CM | POA: Diagnosis present

## 2019-12-10 DIAGNOSIS — E042 Nontoxic multinodular goiter: Secondary | ICD-10-CM | POA: Insufficient documentation

## 2019-12-10 DIAGNOSIS — C771 Secondary and unspecified malignant neoplasm of intrathoracic lymph nodes: Secondary | ICD-10-CM | POA: Insufficient documentation

## 2019-12-10 DIAGNOSIS — Q741 Congenital malformation of knee: Secondary | ICD-10-CM | POA: Insufficient documentation

## 2019-12-10 DIAGNOSIS — M8580 Other specified disorders of bone density and structure, unspecified site: Secondary | ICD-10-CM | POA: Diagnosis not present

## 2019-12-10 DIAGNOSIS — R109 Unspecified abdominal pain: Secondary | ICD-10-CM | POA: Insufficient documentation

## 2019-12-10 DIAGNOSIS — R918 Other nonspecific abnormal finding of lung field: Secondary | ICD-10-CM | POA: Insufficient documentation

## 2019-12-10 DIAGNOSIS — C7801 Secondary malignant neoplasm of right lung: Secondary | ICD-10-CM | POA: Diagnosis not present

## 2019-12-10 DIAGNOSIS — N281 Cyst of kidney, acquired: Secondary | ICD-10-CM | POA: Insufficient documentation

## 2019-12-10 DIAGNOSIS — I739 Peripheral vascular disease, unspecified: Secondary | ICD-10-CM | POA: Diagnosis not present

## 2019-12-10 DIAGNOSIS — G629 Polyneuropathy, unspecified: Secondary | ICD-10-CM | POA: Diagnosis not present

## 2019-12-10 DIAGNOSIS — Z836 Family history of other diseases of the respiratory system: Secondary | ICD-10-CM | POA: Insufficient documentation

## 2019-12-10 DIAGNOSIS — T451X5A Adverse effect of antineoplastic and immunosuppressive drugs, initial encounter: Secondary | ICD-10-CM | POA: Insufficient documentation

## 2019-12-10 DIAGNOSIS — R5383 Other fatigue: Secondary | ICD-10-CM | POA: Insufficient documentation

## 2019-12-10 DIAGNOSIS — Z5111 Encounter for antineoplastic chemotherapy: Secondary | ICD-10-CM | POA: Diagnosis present

## 2019-12-10 DIAGNOSIS — Z9221 Personal history of antineoplastic chemotherapy: Secondary | ICD-10-CM | POA: Insufficient documentation

## 2019-12-10 DIAGNOSIS — R599 Enlarged lymph nodes, unspecified: Secondary | ICD-10-CM | POA: Diagnosis not present

## 2019-12-10 DIAGNOSIS — R739 Hyperglycemia, unspecified: Secondary | ICD-10-CM | POA: Insufficient documentation

## 2019-12-10 DIAGNOSIS — C787 Secondary malignant neoplasm of liver and intrahepatic bile duct: Secondary | ICD-10-CM | POA: Diagnosis not present

## 2019-12-10 DIAGNOSIS — I110 Hypertensive heart disease with heart failure: Secondary | ICD-10-CM | POA: Insufficient documentation

## 2019-12-10 DIAGNOSIS — D6481 Anemia due to antineoplastic chemotherapy: Secondary | ICD-10-CM | POA: Insufficient documentation

## 2019-12-10 DIAGNOSIS — Z9049 Acquired absence of other specified parts of digestive tract: Secondary | ICD-10-CM | POA: Insufficient documentation

## 2019-12-10 DIAGNOSIS — Z85038 Personal history of other malignant neoplasm of large intestine: Secondary | ICD-10-CM | POA: Insufficient documentation

## 2019-12-10 DIAGNOSIS — R262 Difficulty in walking, not elsewhere classified: Secondary | ICD-10-CM | POA: Diagnosis not present

## 2019-12-10 DIAGNOSIS — N400 Enlarged prostate without lower urinary tract symptoms: Secondary | ICD-10-CM | POA: Diagnosis not present

## 2019-12-10 DIAGNOSIS — K219 Gastro-esophageal reflux disease without esophagitis: Secondary | ICD-10-CM | POA: Insufficient documentation

## 2019-12-10 DIAGNOSIS — Z888 Allergy status to other drugs, medicaments and biological substances status: Secondary | ICD-10-CM | POA: Insufficient documentation

## 2019-12-10 DIAGNOSIS — Z809 Family history of malignant neoplasm, unspecified: Secondary | ICD-10-CM | POA: Insufficient documentation

## 2019-12-10 DIAGNOSIS — R635 Abnormal weight gain: Secondary | ICD-10-CM | POA: Diagnosis not present

## 2019-12-10 DIAGNOSIS — Z87891 Personal history of nicotine dependence: Secondary | ICD-10-CM | POA: Insufficient documentation

## 2019-12-10 DIAGNOSIS — M25469 Effusion, unspecified knee: Secondary | ICD-10-CM | POA: Insufficient documentation

## 2019-12-10 DIAGNOSIS — Z808 Family history of malignant neoplasm of other organs or systems: Secondary | ICD-10-CM | POA: Insufficient documentation

## 2019-12-10 DIAGNOSIS — I2510 Atherosclerotic heart disease of native coronary artery without angina pectoris: Secondary | ICD-10-CM | POA: Insufficient documentation

## 2019-12-10 DIAGNOSIS — E785 Hyperlipidemia, unspecified: Secondary | ICD-10-CM | POA: Insufficient documentation

## 2019-12-10 DIAGNOSIS — R200 Anesthesia of skin: Secondary | ICD-10-CM | POA: Insufficient documentation

## 2019-12-10 DIAGNOSIS — R160 Hepatomegaly, not elsewhere classified: Secondary | ICD-10-CM | POA: Diagnosis not present

## 2019-12-10 DIAGNOSIS — Z79899 Other long term (current) drug therapy: Secondary | ICD-10-CM | POA: Insufficient documentation

## 2019-12-10 DIAGNOSIS — Z981 Arthrodesis status: Secondary | ICD-10-CM | POA: Insufficient documentation

## 2019-12-10 MED ORDER — SODIUM CHLORIDE 0.9 % IV SOLN
200.0000 mg | Freq: Once | INTRAVENOUS | Status: AC
  Administered 2019-12-10: 200 mg via INTRAVENOUS
  Filled 2019-12-10: qty 8

## 2019-12-10 MED ORDER — SODIUM CHLORIDE 0.9 % IV SOLN
Freq: Once | INTRAVENOUS | Status: AC
  Filled 2019-12-10: qty 250

## 2019-12-10 MED ORDER — HEPARIN SOD (PORK) LOCK FLUSH 100 UNIT/ML IV SOLN
INTRAVENOUS | Status: AC
  Filled 2019-12-10: qty 5

## 2019-12-10 MED ORDER — SODIUM CHLORIDE 0.9% FLUSH
10.0000 mL | INTRAVENOUS | Status: DC | PRN
  Administered 2019-12-10: 10 mL
  Filled 2019-12-10: qty 10

## 2019-12-10 MED ORDER — HEPARIN SOD (PORK) LOCK FLUSH 100 UNIT/ML IV SOLN
500.0000 [IU] | Freq: Once | INTRAVENOUS | Status: AC | PRN
  Administered 2019-12-10: 500 [IU]
  Filled 2019-12-10: qty 5

## 2019-12-10 NOTE — Progress Notes (Signed)
Use labs from 2/24 for treatment today

## 2019-12-24 ENCOUNTER — Inpatient Hospital Stay: Payer: Medicare Other

## 2019-12-24 NOTE — Progress Notes (Signed)
Nutrition Follow-up:  Patient with metastatic pancreatic cancer to liver.  Patient receiving pembrolizumab.    Spoke with patient via phone for nutrition follow-up.  Patient reports that his appetite is good.  "Once I start eating looks like I keep eating and eating and eating."  Reports that he ate oatmeal for breakfast this am.  Has been trying to eat less salty foods due to blood pressure (ham, pork).  Reports ate cabbage, green beans, potato salad and cornbread for supper last night.  Drinks about 3 ensure per day.    Denies nutrition impact symptoms at this time.   Medications: reviewed  Labs: reviewed  Anthropometrics:   Weight 3/1 179 lb 6.4 oz stable   NUTRITION DIAGNOSIS:  Inadequate oral intake stable   INTERVENTION:  Encouraged patient to continue eating good sources of protein.  Discussed foods high in protein and ways to add to diet.   Patient has contact information    MONITORING, EVALUATION, GOAL: Patient will consume adequate calories and protein to maintain weight   NEXT VISIT: May 3rd during infusion or phone if patient not in clinic  Chloeann Alfred B. Zenia Resides, [place], [place] Registered Dietitian [phone] (pager)

## 2019-12-26 ENCOUNTER — Ambulatory Visit: Payer: Medicare Other

## 2019-12-26 ENCOUNTER — Other Ambulatory Visit: Payer: Medicare Other

## 2019-12-26 ENCOUNTER — Ambulatory Visit: Payer: Medicare Other | Admitting: Oncology

## 2019-12-28 NOTE — Progress Notes (Signed)
Tried calling patient no answer only ringing not able to leave voicemail

## 2019-12-31 ENCOUNTER — Other Ambulatory Visit: Payer: Self-pay

## 2019-12-31 ENCOUNTER — Encounter: Payer: Self-pay | Admitting: Oncology

## 2019-12-31 ENCOUNTER — Inpatient Hospital Stay (HOSPITAL_BASED_OUTPATIENT_CLINIC_OR_DEPARTMENT_OTHER): Payer: Medicare Other | Admitting: Oncology

## 2019-12-31 ENCOUNTER — Inpatient Hospital Stay: Payer: Medicare Other

## 2019-12-31 VITALS — BP 177/75 | HR 73 | Temp 97.4°F | Resp 18 | Wt 185.1 lb

## 2019-12-31 DIAGNOSIS — C259 Malignant neoplasm of pancreas, unspecified: Secondary | ICD-10-CM

## 2019-12-31 DIAGNOSIS — Z95828 Presence of other vascular implants and grafts: Secondary | ICD-10-CM

## 2019-12-31 DIAGNOSIS — C787 Secondary malignant neoplasm of liver and intrahepatic bile duct: Secondary | ICD-10-CM

## 2019-12-31 DIAGNOSIS — Z5112 Encounter for antineoplastic immunotherapy: Secondary | ICD-10-CM | POA: Diagnosis not present

## 2019-12-31 DIAGNOSIS — Z5111 Encounter for antineoplastic chemotherapy: Secondary | ICD-10-CM | POA: Diagnosis not present

## 2019-12-31 DIAGNOSIS — D6481 Anemia due to antineoplastic chemotherapy: Secondary | ICD-10-CM | POA: Diagnosis not present

## 2019-12-31 DIAGNOSIS — R739 Hyperglycemia, unspecified: Secondary | ICD-10-CM

## 2019-12-31 DIAGNOSIS — T451X5A Adverse effect of antineoplastic and immunosuppressive drugs, initial encounter: Secondary | ICD-10-CM

## 2019-12-31 LAB — CBC WITH DIFFERENTIAL/PLATELET
Abs Immature Granulocytes: 0.03 10*3/uL (ref 0.00–0.07)
Basophils Absolute: 0.1 10*3/uL (ref 0.0–0.1)
Basophils Relative: 1 %
Eosinophils Absolute: 0.2 10*3/uL (ref 0.0–0.5)
Eosinophils Relative: 4 %
HCT: 33.8 % — ABNORMAL LOW (ref 39.0–52.0)
Hemoglobin: 10.6 g/dL — ABNORMAL LOW (ref 13.0–17.0)
Immature Granulocytes: 1 %
Lymphocytes Relative: 18 %
Lymphs Abs: 1 10*3/uL (ref 0.7–4.0)
MCH: 28.7 pg (ref 26.0–34.0)
MCHC: 31.4 g/dL (ref 30.0–36.0)
MCV: 91.6 fL (ref 80.0–100.0)
Monocytes Absolute: 0.5 10*3/uL (ref 0.1–1.0)
Monocytes Relative: 9 %
Neutro Abs: 4 10*3/uL (ref 1.7–7.7)
Neutrophils Relative %: 67 %
Platelets: 171 10*3/uL (ref 150–400)
RBC: 3.69 MIL/uL — ABNORMAL LOW (ref 4.22–5.81)
RDW: 14.9 % (ref 11.5–15.5)
WBC: 5.8 10*3/uL (ref 4.0–10.5)
nRBC: 0 % (ref 0.0–0.2)

## 2019-12-31 LAB — COMPREHENSIVE METABOLIC PANEL
ALT: 17 U/L (ref 0–44)
AST: 20 U/L (ref 15–41)
Albumin: 3.7 g/dL (ref 3.5–5.0)
Alkaline Phosphatase: 76 U/L (ref 38–126)
Anion gap: 8 (ref 5–15)
BUN: 22 mg/dL (ref 8–23)
CO2: 23 mmol/L (ref 22–32)
Calcium: 9.3 mg/dL (ref 8.9–10.3)
Chloride: 104 mmol/L (ref 98–111)
Creatinine, Ser: 0.98 mg/dL (ref 0.61–1.24)
GFR calc Af Amer: >60 mL/min (ref >60)
GFR calc non Af Amer: >60 mL/min (ref >60)
Glucose, Bld: 265 mg/dL — ABNORMAL HIGH (ref 70–99)
Potassium: 4.2 mmol/L (ref 3.5–5.1)
Sodium: 135 mmol/L (ref 135–145)
Total Bilirubin: 0.5 mg/dL (ref 0.3–1.2)
Total Protein: 7.3 g/dL (ref 6.5–8.1)

## 2019-12-31 LAB — TSH: TSH: 2.33 u[IU]/mL (ref 0.350–4.500)

## 2019-12-31 MED ORDER — SODIUM CHLORIDE 0.9 % IV SOLN
200.0000 mg | Freq: Once | INTRAVENOUS | Status: AC
  Administered 2019-12-31: 200 mg via INTRAVENOUS
  Filled 2019-12-31: qty 8

## 2019-12-31 MED ORDER — SODIUM CHLORIDE 0.9% FLUSH
10.0000 mL | Freq: Once | INTRAVENOUS | Status: AC
  Administered 2019-12-31: 10 mL via INTRAVENOUS
  Filled 2019-12-31: qty 10

## 2019-12-31 MED ORDER — SODIUM CHLORIDE 0.9 % IV SOLN
Freq: Once | INTRAVENOUS | Status: AC
  Filled 2019-12-31: qty 250

## 2019-12-31 MED ORDER — HEPARIN SOD (PORK) LOCK FLUSH 100 UNIT/ML IV SOLN
500.0000 [IU] | Freq: Once | INTRAVENOUS | Status: AC | PRN
  Administered 2019-12-31: 12:00:00 500 [IU]
  Filled 2019-12-31: qty 5

## 2019-12-31 NOTE — Progress Notes (Addendum)
Hematology/Oncology  Follow up note [facility] [phone] [phone]   Patient Care Team: Paulene Floor as PCP - General (Physician Assistant) Clent Jacks, RN as Oncology Nurse Navigator Earlie Server, MD as Consulting Physician (Hematology and Oncology)  REFERRING PROVIDER: Trinna Post, PA-C  CHIEF COMPLAINTS/REASON FOR VISIT:  Follow up for treatment of pancreatic cancer  HISTORY OF PRESENTING ILLNESS:   Craig [name] is a  74 y.o.  male with PMH listed below, including CABG x5, PVD, hypertension, former tobacco abuse, former alcohol abuse, iron deficiency anemia, was seen in consultation at the request of  Terrilee Croak, Adriana M, PA-C  for evaluation of abnormal CT Patient recently presented to primary care provider complaining for abdominal pain radiating to his back for about 10 days.  Patient uses Tylenol as needed for pain. Work-up showed elevated amylase, lipase, mild anemia with hemoglobin of 11.3 Acute hepatitis panel negative. 05/16/2019 CT abdomen pelvis with contrast showed poorly marginated heterogeneous hypodense 2.9 cm pancreatic mass at the uncinate process, worrisome for primary pancreatic cancer.  No biliary or pancreatic duct dilation. Several ill defined hypodense liver masses scattered throughout the liver, largest 3.1 cm in the segment 6 right liver lobe. Nonspecific portacaval and aortocaval adenopathy. Extreme medial left lung base 4.4 cm pleural-based mass probably a pleural metastasis.  Additional tiny pulmonary nodules scattered at the right lung base are indeterminate. Mild prostate or megaly  Patient was referred to heme-onc for further evaluation. Patient reports that his abdominal pain was 10 out of 10 a few days ago, he uses Tylenol as needed. Today his pain is getting better, 2 out of 10.  Denies any nausea, vomiting, diarrhea, fever or chills. Married, lives with wife.  Appetite is good.  He  weighs 195 pounds today at the clinic. His weight was 204 pounds back in May 2020.  He had a history of 37.5-pack-year smoking history quitting 2018. No longer drinking alcohol.  # ultrasound-guided liver biopsy on 05/21/2019.  Pathology showed fragments of benign hepatic parenchyma showing minimal macro vascular steatosis, otherwise no significant histo pathologic change. Single core of renal tissue showing approximately 25% glomerulosclerosis  # #History of CAD, status post CABG x5.  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  # Omniseq test showed PD-L1 50% TPS, TMB high, negative for BRCA1/2 or NTRK fusion.  MSI could not be completed.  Patient declines genetic testing  INTERVAL HISTORY [name] is a 74 y.o. male who has above history reviewed by me today presents for evaluation prior to chemotherapy for metastatic pancreatic cancer.   Problems and complaints are listed below: Patient has been on palliative treatments with Keytruda. He reports feeing well today. Has gained weight. Fatigue has improved.  Appetite is good.  Denies any rash, shortness of breath, skin rash Accompanied by wife.  . . Review of Systems  Constitutional: Positive for fatigue. Negative for appetite change, chills, fever and unexpected weight change.  HENT:   Negative for hearing loss and voice change.   Eyes: Negative for eye problems and icterus.  Respiratory: Negative for chest tightness, cough and shortness of breath.   Cardiovascular: Negative for chest pain and leg swelling.  Gastrointestinal: Negative for abdominal distention and abdominal pain.  Endocrine: Negative for hot flashes.  Genitourinary: Negative for difficulty urinating, dysuria and frequency.   Musculoskeletal: Negative for arthralgias.  Skin: Negative for itching  and rash.  Neurological: Positive for  numbness. Negative for light-headedness.  Hematological: Negative for adenopathy. Does not bruise/bleed easily.  Psychiatric/Behavioral: Negative for confusion.    MEDICAL HISTORY:  Past Medical History:  Diagnosis Date  . Allergic rhinitis   . Cancer [employer])    new dx Pancreatic Cancer - Aug 2020  . CHF (congestive heart failure) ([place])   . Chronic cholecystitis   . Coronary artery disease   . DDD (degenerative disc disease), cervical   . Dehydration 06/21/2019  . Dyspnea   . Early cataract   . Erectile dysfunction   . GERD (gastroesophageal reflux disease)   . Gouty arthritis   . Headache    occasional migraines  . Hypercalcemia   . Hyperlipemia   . Hypertension   . Palpitations   . Peripheral vascular disease ([place])   . 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   LIMA to LAD SVG to [place] SVG SEQUENTIALLY to OM1 and OM2 SVG to ACUTE MARGINAL  . Spinal stenosis of cervical region   . Vitamin D deficiency     SURGICAL HISTORY: Past Surgical History:  Procedure Laterality Date  . BACK SURGERY  2018    fusion with screws  . CHOLECYSTECTOMY N/A 11/13/2018   Procedure: LAPAROSCOPIC CHOLECYSTECTOMY;  Surgeon: Vickie Epley, MD;  Location: [facility];  Service: General;  Laterality: N/A;  . COLONOSCOPY    . CORONARY ARTERY BYPASS GRAFT N/A 09/30/2017   Procedure: CORONARY ARTERY BYPASS GRAFTING times five using right and left Saphaneous vein harvested endoscopicly  and left internal mammary artery. (CABG),TEE;  Surgeon: Rexene Alberts, MD;  Location: [employer] [facility];  Service: Open Heart Surgery;  Laterality: N/A;  . LEFT HEART CATH AND CORONARY ANGIOGRAPHY Left 09/06/2017   Procedure: LEFT HEART CATH AND CORONARY ANGIOGRAPHY;  Surgeon: Corey Skains, MD;  Location: [place] [facility];  Service: Cardiovascular;  Laterality: Left;  . percutaneous transluminal balloon angioplasty  01/2010   of left lower extremity  . PORTA CATH INSERTION N/A 06/06/2019   Procedure: PORTA CATH INSERTION;   Surgeon: Katha Cabal, MD;  Location: [place] [facility];  Service: Cardiovascular;  Laterality: N/A;  . TEE WITHOUT CARDIOVERSION N/A 09/30/2017   Procedure: TRANSESOPHAGEAL ECHOCARDIOGRAM (TEE);  Surgeon: Rexene Alberts, MD;  Location: [place];  Service: Open Heart Surgery;  Laterality: N/A;  . VASCULAR SURGERY Left 2010   left exernal iliac and superficial femoral artery PTCA and stenting    SOCIAL HISTORY: Social History   Socioeconomic History  . Marital status: Married    Spouse name: Enid Derry  . Number of children: 2  . Years of education: Not on file  . Highest education level: Not on file  Occupational History  . Occupation: drove tractors    Comment: retired  Tobacco Use  . Smoking status: Former Smoker    Packs/day: 0.75    Years: 50.00    Pack years: 37.50    Types: Cigarettes    Quit date: 09/27/2017    Years since quitting: 2.2  . Smokeless tobacco: Former Systems developer    Types: Chew  Substance and Sexual Activity  . Alcohol use: Not Currently    Comment: stopped drinking before cabg  . Drug use: Not Currently    Types: Marijuana    Comment: stopped smoking before CABG  . Sexual activity: Not Currently  Other Topics Concern  . Not on file  Social History Narrative  . Not on file   Social Determinants of Health  Financial Resource Strain: Low Risk   . Difficulty of Paying Living Expenses: Not hard at all  Food Insecurity: No Food Insecurity  . Worried About Charity fundraiser in the Last Year: Never true  . Ran Out of Food in the Last Year: Never true  Transportation Needs: No Transportation Needs  . Lack of Transportation (Medical): No  . Lack of Transportation (Non-Medical): No  Physical Activity: Sufficiently Active  . Days of Exercise per Week: 5 days  . Minutes of Exercise per Session: 150+ min  Stress: No Stress Concern Present  . Feeling of Stress : Not at all  Social Connections: Somewhat Isolated  . Frequency of Communication with  Friends and Family: More than three times a week  . Frequency of Social Gatherings with Friends and Family: More than three times a week  . Attends Religious Services: Never  . Active Member of Clubs or Organizations: No  . Attends Archivist Meetings: Never  . Marital Status: Married  Human resources officer Violence:   . Fear of Current or Ex-Partner:   . Emotionally Abused:   [name] Kitchen Physically Abused:   . Sexually Abused:     FAMILY HISTORY: Family History  Problem Relation Age of Onset  . Brain cancer Mother   . Emphysema Father   . Cancer Brother     ALLERGIES:  is allergic to lipitor [atorvastatin]; zetia [ezetimibe]; lisinopril; protonix [pantoprazole]; and spironolactone.  MEDICATIONS:  Current Outpatient Medications  Medication Sig Dispense Refill  . aspirin EC 81 MG tablet Take 81 mg by mouth daily.    . carvedilol (COREG) 6.25 MG tablet Take 6.25 mg by mouth 2 (two) times daily with a meal.  3  . diltiazem (CARDIZEM CD) 300 MG 24 hr capsule TAKE 1 CAPSULE BY MOUTH EVERY DAY 90 capsule 1  . dorzolamide-timolol (COSOPT) 22.3-6.8 MG/ML ophthalmic solution INSTILL ONE DROP INTO BOTH EYES TWICE DAILY    . ferrous sulfate 325 (65 FE) MG tablet TAKE 1 TABLET BY MOUTH EVERY DAY WITH BREAKFAST 90 tablet 1  . gabapentin (NEURONTIN) 300 MG capsule TAKE 2 CAPSULES (600 MG TOTAL) BY MOUTH 3 (THREE) TIMES DAILY. 540 capsule 0  . ibuprofen (ADVIL) 800 MG tablet Take 1 tablet (800 mg total) by mouth every 8 (eight) hours as needed. 30 tablet 0  . latanoprost (XALATAN) 0.005 % ophthalmic solution Place 1 drop into both eyes at bedtime.   3  . lidocaine-prilocaine (EMLA) cream APPLY TO AFFECTED AREA ONCE AS DIRECTED    . Multiple Vitamin (MULTIVITAMIN WITH MINERALS) TABS tablet Take 1 tablet by mouth daily.    . OMEGA-3 FATTY ACIDS PO Take 1,200 mg by mouth daily.     [name] Kitchen omeprazole (PRILOSEC) 40 MG capsule TAKE 1 CAPSULE BY MOUTH EVERY DAY 90 capsule 1   No current facility-administered  medications for this visit.   Facility-Administered Medications Ordered in Other Visits  Medication Dose Route Frequency Provider Last Rate Last Admin  . sodium chloride flush (NS) 0.9 % injection 10 mL  10 mL Intracatheter PRN Earlie Server, MD      . sodium chloride flush (NS) 0.9 % injection 10 mL  10 mL Intravenous Once Earlie Server, MD         PHYSICAL EXAMINATION: ECOG PERFORMANCE STATUS: 1 - Symptomatic but completely ambulatory Vitals:   12/31/19 0922  BP: (!) 177/75  Pulse: 73  Resp: 18  Temp: (!) 97.4 F (36.3 C)   Filed Weights   12/31/19 6378  Weight: 185 lb 1.6 oz (84 kg)    Physical Exam Constitutional:      General: He is not in acute distress. HENT:     Head: Normocephalic and atraumatic.  Eyes:     General: No scleral icterus. Cardiovascular:     Rate and Rhythm: Normal rate and regular rhythm.     Heart sounds: Normal heart sounds.  Pulmonary:     Effort: Pulmonary effort is normal. No respiratory distress.     Breath sounds: No wheezing.  Abdominal:     General: Bowel sounds are normal. There is no distension.     Palpations: Abdomen is soft.  Musculoskeletal:        General: No deformity. Normal range of motion.     Cervical back: Normal range of motion and neck supple.  Skin:    General: Skin is warm and dry.     Findings: No erythema or rash.  Neurological:     Mental Status: He is alert and oriented to person, place, and time. Mental status is at baseline.     Cranial Nerves: No cranial nerve deficit.     Coordination: Coordination normal.  Psychiatric:        Mood and Affect: Mood normal.     LABORATORY DATA:  I have reviewed the data as listed Lab Results  Component Value Date   WBC 5.8 12/31/2019   HGB 10.6 (L) 12/31/2019   HCT 33.8 (L) 12/31/2019   MCV 91.6 12/31/2019   PLT 171 12/31/2019   Recent Labs    11/19/19 0918 12/05/19 1255 12/31/19 0858  NA 136 139 135  K 3.9 4.0 4.2  CL 104 108 104  CO2 '23 23 23  '$ GLUCOSE 156* 158*  265*  BUN '17 21 22  '$ CREATININE 0.80 0.79 0.98  CALCIUM 9.1 9.1 9.3  GFRNONAA >60 >60 >60  GFRAA >60 >60 >60  PROT 7.3 7.6 7.3  ALBUMIN 3.5 3.7 3.7  AST '18 18 20  '$ ALT '15 15 17  '$ ALKPHOS 60 62 76  BILITOT 0.4 0.4 0.5   Iron/TIBC/Ferritin/ %Sat    Component Value Date/Time   IRON 119 04/27/2019 0940   TIBC 363 04/27/2019 0940   FERRITIN 58 04/27/2019 0940   IRONPCTSAT 33 04/27/2019 0940      RADIOGRAPHIC STUDIES: I have personally reviewed the radiological images as listed and agreed with the findings in the report. DG Knee 1-2 Views Right  Result Date: 10/15/2019 CLINICAL DATA:  Lateral knee pain and swelling with difficulty walking. No known injury. History of colon cancer. EXAM: RIGHT KNEE - 1-2 VIEW COMPARISON:  None. FINDINGS: The mineralization and alignment are normal. There is no evidence of acute fracture or dislocation. The joint spaces are relatively preserved. There is fragmentation of the patella superolaterally consistent with a bipartite patella (normal variant). There is a moderate size knee joint effusion. Femoropopliteal atherosclerosis is noted with vascular clips medially in the lower leg. IMPRESSION: 1. No acute osseous findings. 2. Moderate size knee joint effusion. 3. Bipartite patella. Electronically Signed   By: Richardean Sale M.D.   On: 10/15/2019 14:31   CT Chest W Contrast  Result Date: 11/05/2019 CLINICAL DATA:  Pancreatic cancer. Follow up for treatment response. EXAM: CT CHEST, ABDOMEN, AND PELVIS WITH CONTRAST TECHNIQUE: Multidetector CT imaging of the chest, abdomen and pelvis was performed following the standard protocol during bolus administration of intravenous contrast. CONTRAST:  123m OMNIPAQUE IOHEXOL 300 MG/ML  SOLN COMPARISON:  Prior CTs 05/16/2019 and 09/10/2019.  PET-CT 06/04/2019. FINDINGS: CT CHEST FINDINGS Cardiovascular: Diffuse atherosclerosis of the aorta, great vessels and coronary arteries status post median sternotomy and CABG. No acute  vascular findings are evident. Right IJ Port-A-Cath extends to the superior cavoatrial junction. Probable aortic valvular calcifications. The heart size is normal. There is no pericardial effusion. Mediastinum/Nodes: There are no enlarged mediastinal, hilar or axillary lymph nodes. Small mediastinal lymph nodes are stable. There is stable minimal nodularity of the thyroid gland, consistent with a benign etiology. The trachea and esophagus appear normal. Lungs/Pleura: No pleural effusion or pneumothorax. Pleural based mass involving the left hemidiaphragm medially is grossly stable, measuring 4.2 x 2.8 cm on image 47/2. This was not hypermetabolic on PET-CT. Scattered tiny subpleural nodules are stable, likely benign. No new or enlarging pulmonary nodules. Musculoskeletal/Chest wall: No chest wall mass or suspicious osseous findings. There are stable degenerative changes within the thoracic spine and stable mild anterior wedging of the T5 vertebral body. Previous median sternotomy. CT ABDOMEN AND PELVIS FINDINGS Hepatobiliary: There are several ill-defined low-density hepatic lesions which are inapparent on the most recent study, suspicious for recurrent metastatic disease. Largest lesion posteriorly in the dome of the right hepatic lobe may reflect 2 adjacent lesions, measuring 2.1 x 1.2 cm on image 51/2. There is a smaller lesion more anteriorly in the right hepatic lobe measuring 1.1 cm on image 51/2. There is a 1.1 cm lesion inferiorly in the right lobe on image 62/2. Previous cholecystectomy. No significant biliary dilatation. Pancreas: The previously demonstrated low-density mass projecting medially from the uncinate process has resolved. There is stable isodense prominence of the pancreatic head compared with the most recent study, measuring approximately 3.6 x 2.2 cm on image 66/2. No pancreatic ductal dilatation or surrounding inflammation. Spleen: Normal in size without focal abnormality. Adrenals/Urinary  Tract: Both adrenal glands appear normal. There are bilateral renal cysts, largest in the lower pole of the right kidney, measuring 4.0 cm. In the lower pole of the left kidney, there is in 9 mm lesion (image 32/7) which is slightly denser than on the previous study, but unchanged in size, probably a complex cyst. No evidence of urinary tract calculus or hydronephrosis. The bladder appears unremarkable for its degree of distention. Stomach/Bowel: No evidence of bowel wall thickening, distention or surrounding inflammatory change. Chronic gastric wall thickening, similar to previous study. Vascular/Lymphatic: There are no enlarged abdominal or pelvic lymph nodes. Stable small lymph nodes in the porta hepatis. Aortic and branch vessel atherosclerosis. No acute vascular findings. Reproductive: Stable mild enlargement of the prostate gland. Other: Intact anterior abdominal wall. No ascites or peritoneal nodularity. Musculoskeletal: No acute or significant osseous findings. Previous L4-5 fusion. IMPRESSION: 1. Several ill-defined low-density hepatic lesions are inapparent on the most recent study, suspicious for recurrent metastatic disease. Consider further evaluation with abdominal MRI or PET-CT. 2. Grossly stable appearance of the pancreatic head without evidence of local recurrence. No recurrent adenopathy in the chest or abdomen. 3. Stable small pulmonary nodules bilaterally consistent with a benign etiology. 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 Electronically Signed   By: Richardean Sale M.D.   On: 11/05/2019 11:46   CT Abdomen Pelvis W Contrast  Result Date: 11/05/2019 CLINICAL DATA:  Pancreatic cancer. Follow up for treatment response. EXAM: CT CHEST, ABDOMEN, AND PELVIS WITH CONTRAST TECHNIQUE: Multidetector CT imaging of the chest, abdomen and pelvis was performed following the standard protocol during bolus  administration of  intravenous contrast. CONTRAST:  131m OMNIPAQUE IOHEXOL 300 MG/ML  SOLN COMPARISON:  Prior CTs 05/16/2019 and 09/10/2019. PET-CT 06/04/2019. FINDINGS: CT CHEST FINDINGS Cardiovascular: Diffuse atherosclerosis of the aorta, great vessels and coronary arteries status post median sternotomy and CABG. No acute vascular findings are evident. Right IJ Port-A-Cath extends to the superior cavoatrial junction. Probable aortic valvular calcifications. The heart size is normal. There is no pericardial effusion. Mediastinum/Nodes: There are no enlarged mediastinal, hilar or axillary lymph nodes. Small mediastinal lymph nodes are stable. There is stable minimal nodularity of the thyroid gland, consistent with a benign etiology. The trachea and esophagus appear normal. Lungs/Pleura: No pleural effusion or pneumothorax. Pleural based mass involving the left hemidiaphragm medially is grossly stable, measuring 4.2 x 2.8 cm on image 47/2. This was not hypermetabolic on PET-CT. Scattered tiny subpleural nodules are stable, likely benign. No new or enlarging pulmonary nodules. Musculoskeletal/Chest wall: No chest wall mass or suspicious osseous findings. There are stable degenerative changes within the thoracic spine and stable mild anterior wedging of the T5 vertebral body. Previous median sternotomy. CT ABDOMEN AND PELVIS FINDINGS Hepatobiliary: There are several ill-defined low-density hepatic lesions which are inapparent on the most recent study, suspicious for recurrent metastatic disease. Largest lesion posteriorly in the dome of the right hepatic lobe may reflect 2 adjacent lesions, measuring 2.1 x 1.2 cm on image 51/2. There is a smaller lesion more anteriorly in the right hepatic lobe measuring 1.1 cm on image 51/2. There is a 1.1 cm lesion inferiorly in the right lobe on image 62/2. Previous cholecystectomy. No significant biliary dilatation. Pancreas: The previously demonstrated low-density mass projecting  medially from the uncinate process has resolved. There is stable isodense prominence of the pancreatic head compared with the most recent study, measuring approximately 3.6 x 2.2 cm on image 66/2. No pancreatic ductal dilatation or surrounding inflammation. Spleen: Normal in size without focal abnormality. Adrenals/Urinary Tract: Both adrenal glands appear normal. There are bilateral renal cysts, largest in the lower pole of the right kidney, measuring 4.0 cm. In the lower pole of the left kidney, there is in 9 mm lesion (image 32/7) which is slightly denser than on the previous study, but unchanged in size, probably a complex cyst. No evidence of urinary tract calculus or hydronephrosis. The bladder appears unremarkable for its degree of distention. Stomach/Bowel: No evidence of bowel wall thickening, distention or surrounding inflammatory change. Chronic gastric wall thickening, similar to previous study. Vascular/Lymphatic: There are no enlarged abdominal or pelvic lymph nodes. Stable small lymph nodes in the porta hepatis. Aortic and branch vessel atherosclerosis. No acute vascular findings. Reproductive: Stable mild enlargement of the prostate gland. Other: Intact anterior abdominal wall. No ascites or peritoneal nodularity. Musculoskeletal: No acute or significant osseous findings. Previous L4-5 fusion. IMPRESSION: 1. Several ill-defined low-density hepatic lesions are inapparent on the most recent study, suspicious for recurrent metastatic disease. Consider further evaluation with abdominal MRI or PET-CT. 2. Grossly stable appearance of the pancreatic head without evidence of local recurrence. No recurrent adenopathy in the chest or abdomen. 3. Stable small pulmonary nodules bilaterally consistent with a benign etiology. 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 Electronically Signed   By: WRichardean SaleM.D.   On:  11/05/2019 11:46      ASSESSMENT & PLAN:  1. Pancreatic cancer metastasized to liver ([facility]   2. Encounter for antineoplastic immunotherapy   3. Anemia due to antineoplastic chemotherapy  4. Hyperglycemia     #Metastatic pancreatic cancer- liver mets, hilar nodal metastasis, ?bone mets, CA 19-9 has trended up to 7161 at last visit.  He clinically feels well, tolerated Keytruda.  Today's CA19.9 is pending.  Labs are reviewed and discussed with patient. I may obtain CT images after this cycle if CA 19.9 continues to trend up.  Proceed with Cycle 3 Keytruda today.   #Pre-existing neuropathy, discussed with patient that I recommend him to continue take gabapentin 600 mg 3 times daily. Continue.  #Normocytic anemia, due to previous chemotherapy, hemoglobin has improved.  # hyperglycemia, check Hb A1c  All questions were answered. The patient knows to call the clinic with any problems questions or concerns. Follow-up in 3 weeks for cycle 4 Keytruda  Addendum, CA19.9 is trending significantly higher, obtain CT chest abdomen pelvis.  Need to switch to liposomal irinotecan + 5-FU  Earlie Server, MD, PhD Hematology Oncology [place] at [facility] 12/31/2019

## 2019-12-31 NOTE — Progress Notes (Signed)
Patient does not offer any problems today.  

## 2020-01-01 ENCOUNTER — Telehealth: Payer: Self-pay

## 2020-01-01 ENCOUNTER — Other Ambulatory Visit: Payer: Self-pay

## 2020-01-01 DIAGNOSIS — C259 Malignant neoplasm of pancreas, unspecified: Secondary | ICD-10-CM

## 2020-01-01 DIAGNOSIS — C787 Secondary malignant neoplasm of liver and intrahepatic bile duct: Secondary | ICD-10-CM

## 2020-01-01 DIAGNOSIS — R739 Hyperglycemia, unspecified: Secondary | ICD-10-CM

## 2020-01-01 LAB — HEMOGLOBIN A1C
Hgb A1c MFr Bld: 6.7 % — ABNORMAL HIGH (ref 4.8–5.6)
Mean Plasma Glucose: 145.59 mg/dL

## 2020-01-01 LAB — CANCER ANTIGEN 19-9: CA 19-9: 22566 U/mL — ABNORMAL HIGH (ref 0–35)

## 2020-01-01 NOTE — Telephone Encounter (Signed)
A1C has been added to yesterday labs drawn.

## 2020-01-01 NOTE — Telephone Encounter (Signed)
-----   Message from Earlie Server, MD sent at 12/31/2019  8:39 PM EDT ----- Please add A1c to today's lab. Glucose is high, no history of DM.  If can not be added, please have him do another lab encounter. Thanks.

## 2020-01-02 ENCOUNTER — Telehealth: Payer: Self-pay

## 2020-01-02 ENCOUNTER — Other Ambulatory Visit: Payer: Self-pay

## 2020-01-02 ENCOUNTER — Ambulatory Visit
Admission: RE | Admit: 2020-01-02 | Discharge: 2020-01-02 | Disposition: A | Discharge status: Home / Self Care | Payer: Medicare Other | Source: Ambulatory Visit | Attending: Oncology | Admitting: Oncology

## 2020-01-02 DIAGNOSIS — C787 Secondary malignant neoplasm of liver and intrahepatic bile duct: Secondary | ICD-10-CM | POA: Diagnosis present

## 2020-01-02 DIAGNOSIS — C259 Malignant neoplasm of pancreas, unspecified: Secondary | ICD-10-CM | POA: Diagnosis not present

## 2020-01-02 MED ORDER — PROCHLORPERAZINE MALEATE 10 MG PO TABS: 10.0000 mg | ORAL_TABLET | Freq: Four times a day (QID) | ORAL | 1 refills | Status: DC | PRN

## 2020-01-02 MED ORDER — IOHEXOL 300 MG/ML  SOLN
100.0000 mL | Freq: Once | INTRAMUSCULAR | Status: AC | PRN
  Administered 2020-01-02: 100 mL via INTRAVENOUS

## 2020-01-02 MED ORDER — DEXAMETHASONE 4 MG PO TABS: 8.0000 mg | ORAL_TABLET | Freq: Every day | ORAL | 5 refills | Status: DC

## 2020-01-02 MED ORDER — LOPERAMIDE HCL 2 MG PO TABS: 2.0000 mg | ORAL_TABLET | ORAL | 1 refills | Status: DC

## 2020-01-02 NOTE — Progress Notes (Signed)
DISCONTINUE ON PATHWAY REGIMEN - Pancreatic Adenocarcinoma     A cycle is 21 days:     Pembrolizumab   **Always confirm dose/schedule in your pharmacy ordering system**  REASON: Disease Progression PRIOR TREATMENT: PANOS88: Pembrolizumab 200 mg q21 Days Until Progression, Unacceptable Toxicity, or up to 24 Months TREATMENT RESPONSE: Progressive Disease (PD)  START ON PATHWAY REGIMEN - Pancreatic Adenocarcinoma     A cycle is every 14 days:     Liposomal irinotecan      Leucovorin      Fluorouracil   **Always confirm dose/schedule in your pharmacy ordering system**  Patient Characteristics: Metastatic Disease, Second Line, MSS/pMMR or MSI Unknown, Gemcitabine-Based Therapy First Line Therapeutic Status: Metastatic Disease Line of Therapy: Second Line Microsatellite/Mismatch Repair Status: Unknown Intent of Therapy: Non-Curative / Palliative Intent, Discussed with Patient

## 2020-01-02 NOTE — Telephone Encounter (Addendum)
Patient's wife informed of rising PSA.  Dr. Zollie Scale like for him to have CT chest/abdomen/pelvis ASAP.

## 2020-01-02 NOTE — Addendum Note (Signed)
Addended by: Earlie Server on: 01/02/2020 03:03 PM   Modules accepted: Orders

## 2020-01-02 NOTE — Telephone Encounter (Signed)
Patient is going to STAT CT scan today and will be scheduled next week for  liposomal irinotecan and 5-FU lab md 1 day after CT.  Day 3 pump discontinue

## 2020-01-03 NOTE — Telephone Encounter (Signed)
MD notified patient that he will need to start tx.  Called to let him know about appts scheduled for Monday 3/29 starting at 8:00 with labs/followed by MD/then infusion.

## 2020-01-07 ENCOUNTER — Encounter: Payer: Self-pay | Admitting: Oncology

## 2020-01-07 ENCOUNTER — Telehealth: Payer: Self-pay | Admitting: Physician Assistant

## 2020-01-07 ENCOUNTER — Inpatient Hospital Stay: Payer: Medicare Other

## 2020-01-07 ENCOUNTER — Telehealth: Payer: Self-pay

## 2020-01-07 ENCOUNTER — Inpatient Hospital Stay (HOSPITAL_BASED_OUTPATIENT_CLINIC_OR_DEPARTMENT_OTHER): Payer: Medicare Other | Admitting: Oncology

## 2020-01-07 VITALS — BP 147/76 | HR 81 | Temp 97.3°F | Resp 18 | Wt 183.4 lb

## 2020-01-07 DIAGNOSIS — D6481 Anemia due to antineoplastic chemotherapy: Secondary | ICD-10-CM | POA: Diagnosis not present

## 2020-01-07 DIAGNOSIS — Z5111 Encounter for antineoplastic chemotherapy: Secondary | ICD-10-CM

## 2020-01-07 DIAGNOSIS — T451X5A Adverse effect of antineoplastic and immunosuppressive drugs, initial encounter: Secondary | ICD-10-CM

## 2020-01-07 DIAGNOSIS — C259 Malignant neoplasm of pancreas, unspecified: Secondary | ICD-10-CM

## 2020-01-07 DIAGNOSIS — Z7189 Other specified counseling: Secondary | ICD-10-CM | POA: Diagnosis not present

## 2020-01-07 DIAGNOSIS — R739 Hyperglycemia, unspecified: Secondary | ICD-10-CM

## 2020-01-07 DIAGNOSIS — C787 Secondary malignant neoplasm of liver and intrahepatic bile duct: Secondary | ICD-10-CM

## 2020-01-07 LAB — COMPREHENSIVE METABOLIC PANEL
ALT: 22 U/L (ref 0–44)
AST: 23 U/L (ref 15–41)
Albumin: 3.6 g/dL (ref 3.5–5.0)
Alkaline Phosphatase: 87 U/L (ref 38–126)
Anion gap: 7 (ref 5–15)
BUN: 20 mg/dL (ref 8–23)
CO2: 23 mmol/L (ref 22–32)
Calcium: 9.3 mg/dL (ref 8.9–10.3)
Chloride: 103 mmol/L (ref 98–111)
Creatinine, Ser: 0.96 mg/dL (ref 0.61–1.24)
GFR calc Af Amer: >60 mL/min (ref >60)
GFR calc non Af Amer: >60 mL/min (ref >60)
Glucose, Bld: 403 mg/dL — ABNORMAL HIGH (ref 70–99)
Potassium: 4.1 mmol/L (ref 3.5–5.1)
Sodium: 133 mmol/L — ABNORMAL LOW (ref 135–145)
Total Bilirubin: 0.6 mg/dL (ref 0.3–1.2)
Total Protein: 7.6 g/dL (ref 6.5–8.1)

## 2020-01-07 LAB — CBC WITH DIFFERENTIAL/PLATELET
Abs Immature Granulocytes: 0.04 10*3/uL (ref 0.00–0.07)
Basophils Absolute: 0.1 10*3/uL (ref 0.0–0.1)
Basophils Relative: 1 %
Eosinophils Absolute: 0.2 10*3/uL (ref 0.0–0.5)
Eosinophils Relative: 3 %
HCT: 34.4 % — ABNORMAL LOW (ref 39.0–52.0)
Hemoglobin: 10.8 g/dL — ABNORMAL LOW (ref 13.0–17.0)
Immature Granulocytes: 1 %
Lymphocytes Relative: 16 %
Lymphs Abs: 1.1 10*3/uL (ref 0.7–4.0)
MCH: 28.6 pg (ref 26.0–34.0)
MCHC: 31.4 g/dL (ref 30.0–36.0)
MCV: 91 fL (ref 80.0–100.0)
Monocytes Absolute: 0.6 10*3/uL (ref 0.1–1.0)
Monocytes Relative: 9 %
Neutro Abs: 4.7 10*3/uL (ref 1.7–7.7)
Neutrophils Relative %: 70 %
Platelets: 198 10*3/uL (ref 150–400)
RBC: 3.78 MIL/uL — ABNORMAL LOW (ref 4.22–5.81)
RDW: 14.5 % (ref 11.5–15.5)
WBC: 6.6 10*3/uL (ref 4.0–10.5)
nRBC: 0 % (ref 0.0–0.2)

## 2020-01-07 MED ORDER — SODIUM CHLORIDE 0.9 % IV SOLN
Freq: Once | INTRAVENOUS | Status: AC
  Filled 2020-01-07: qty 250

## 2020-01-07 MED ORDER — SODIUM CHLORIDE 0.9 % IV SOLN
96.0000 mg | Freq: Once | INTRAVENOUS | Status: AC
  Administered 2020-01-07: 96 mg via INTRAVENOUS
  Filled 2020-01-07: qty 22.33

## 2020-01-07 MED ORDER — SODIUM CHLORIDE 0.9 % IV SOLN
800.0000 mg | Freq: Once | INTRAVENOUS | Status: AC
  Administered 2020-01-07: 800 mg via INTRAVENOUS
  Filled 2020-01-07: qty 25

## 2020-01-07 MED ORDER — SODIUM CHLORIDE 0.9 % IV SOLN
2400.0000 mg/m2 | INTRAVENOUS | Status: DC
  Administered 2020-01-07: 4550 mg via INTRAVENOUS
  Filled 2020-01-07: qty 91

## 2020-01-07 MED ORDER — SODIUM CHLORIDE 0.9 % IV SOLN
10.0000 mg | Freq: Once | INTRAVENOUS | Status: DC
  Filled 2020-01-07: qty 1

## 2020-01-07 MED ORDER — PALONOSETRON HCL INJECTION 0.25 MG/5ML
0.2500 mg | Freq: Once | INTRAVENOUS | Status: AC
  Administered 2020-01-07: 0.25 mg via INTRAVENOUS
  Filled 2020-01-07: qty 5

## 2020-01-07 MED ORDER — SODIUM CHLORIDE 0.9% FLUSH
10.0000 mL | Freq: Once | INTRAVENOUS | Status: AC
  Administered 2020-01-07: 10 mL via INTRAVENOUS
  Filled 2020-01-07: qty 10

## 2020-01-07 NOTE — Progress Notes (Signed)
Hematology/Oncology  Follow up note [facility] [phone] [phone]   Patient Care Team: Paulene Floor as PCP - General (Physician Assistant) Clent Jacks, RN as Oncology Nurse Navigator Earlie Server, MD as Consulting Physician (Hematology and Oncology)  REFERRING PROVIDER: Trinna Post, PA-C  CHIEF COMPLAINTS/REASON FOR VISIT:  Follow up for treatment of pancreatic cancer  HISTORY OF PRESENTING ILLNESS:   Craig [name] is a  74 y.o.  male with PMH listed below, including CABG x5, PVD, hypertension, former tobacco abuse, former alcohol abuse, iron deficiency anemia, was seen in consultation at the request of  Terrilee Croak, Adriana M, PA-C  for evaluation of abnormal CT Patient recently presented to primary care provider complaining for abdominal pain radiating to his back for about 10 days.  Patient uses Tylenol as needed for pain. Work-up showed elevated amylase, lipase, mild anemia with hemoglobin of 11.3 Acute hepatitis panel negative. 05/16/2019 CT abdomen pelvis with contrast showed poorly marginated heterogeneous hypodense 2.9 cm pancreatic mass at the uncinate process, worrisome for primary pancreatic cancer.  No biliary or pancreatic duct dilation. Several ill defined hypodense liver masses scattered throughout the liver, largest 3.1 cm in the segment 6 right liver lobe. Nonspecific portacaval and aortocaval adenopathy. Extreme medial left lung base 4.4 cm pleural-based mass probably a pleural metastasis.  Additional tiny pulmonary nodules scattered at the right lung base are indeterminate. Mild prostate or megaly  Patient was referred to heme-onc for further evaluation. Patient reports that his abdominal pain was 10 out of 10 a few days ago, he uses Tylenol as needed. Today his pain is getting better, 2 out of 10.  Denies any nausea, vomiting, diarrhea, fever or chills. Married, lives with wife.  Appetite is good.  He  weighs 195 pounds today at the clinic. His weight was 204 pounds back in May 2020.  He had a history of 37.5-pack-year smoking history quitting 2018. No longer drinking alcohol.  # ultrasound-guided liver biopsy on 05/21/2019.  Pathology showed fragments of benign hepatic parenchyma showing minimal macro vascular steatosis, otherwise no significant histo pathologic change. Single core of renal tissue showing approximately 25% glomerulosclerosis  # #History of CAD, status post CABG x5.  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  # Omniseq test showed PD-L1 50% TPS, TMB high, negative for BRCA1/2 or NTRK fusion.  MSI could not be completed.  Patient declines genetic testing  INTERVAL HISTORY Craig [name] is a 73 y.o. male who has above history reviewed by me today presents for evaluation prior to chemotherapy for metastatic pancreatic cancer.   Problems and complaints are listed below: Second line treatment with Keytruda.  Unfortunately cancer marker progressively rising.  Obtain stat CT chest abdomen pelvis which showed significant disease progression. Patient was seen and evaluated today.  He has no new complaints.  Appetite is good. He was accompanied by his wife for discussion of the CT scan and management plan.  . Review of Systems  Constitutional: Positive for fatigue. Negative for appetite change, chills, fever and unexpected weight change.  HENT:   Negative for hearing loss and voice change.   Eyes: Negative for eye problems and icterus.  Respiratory: Negative for chest tightness, cough and shortness of breath.   Cardiovascular: Negative for chest pain and leg swelling.  Gastrointestinal: Negative for abdominal distention and abdominal pain.  Endocrine: Negative for hot flashes.  Genitourinary:  Negative for difficulty urinating, dysuria and frequency.     Musculoskeletal: Negative for arthralgias.  Skin: Negative for itching and rash.  Neurological: Positive for numbness. Negative for light-headedness.  Hematological: Negative for adenopathy. Does not bruise/bleed easily.  Psychiatric/Behavioral: Negative for confusion.    MEDICAL HISTORY:  Past Medical History:  Diagnosis Date  . Allergic rhinitis   . Cancer [facility])    new dx Pancreatic Cancer - Aug 2020  . CHF (congestive heart failure) ([place])   . Chronic cholecystitis   . Coronary artery disease   . DDD (degenerative disc disease), cervical   . Dehydration 06/21/2019  . Dyspnea   . Early cataract   . Erectile dysfunction   . GERD (gastroesophageal reflux disease)   . Gouty arthritis   . Headache    occasional migraines  . Hypercalcemia   . Hyperlipemia   . Hypertension   . Palpitations   . Peripheral vascular disease ([place])   . 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   LIMA to LAD SVG to [place] SVG SEQUENTIALLY to OM1 and OM2 SVG to ACUTE MARGINAL  . Spinal stenosis of cervical region   . Vitamin D deficiency     SURGICAL HISTORY: Past Surgical History:  Procedure Laterality Date  . BACK SURGERY  2018    fusion with screws  . CHOLECYSTECTOMY N/A 11/13/2018   Procedure: LAPAROSCOPIC CHOLECYSTECTOMY;  Surgeon: Vickie Epley, MD;  Location: [facility] ORS;  Service: General;  Laterality: N/A;  . COLONOSCOPY    . CORONARY ARTERY BYPASS GRAFT N/A 09/30/2017   Procedure: CORONARY ARTERY BYPASS GRAFTING times five using right and left Saphaneous vein harvested endoscopicly  and left internal mammary artery. (CABG),TEE;  Surgeon: Rexene Alberts, MD;  Location: [place];  Service: Open Heart Surgery;  Laterality: N/A;  . LEFT HEART CATH AND CORONARY ANGIOGRAPHY Left 09/06/2017   Procedure: LEFT HEART CATH AND CORONARY ANGIOGRAPHY;  Surgeon: Corey Skains, MD;  Location: [place] [facility];  Service: Cardiovascular;  Laterality: Left;  . percutaneous transluminal balloon angioplasty   01/2010   of left lower extremity  . PORTA CATH INSERTION N/A 06/06/2019   Procedure: PORTA CATH INSERTION;  Surgeon: Katha Cabal, MD;  Location: [place] CV [facility];  Service: Cardiovascular;  Laterality: N/A;  . TEE WITHOUT CARDIOVERSION N/A 09/30/2017   Procedure: TRANSESOPHAGEAL ECHOCARDIOGRAM (TEE);  Surgeon: Rexene Alberts, MD;  Location: [place];  Service: Open Heart Surgery;  Laterality: N/A;  . VASCULAR SURGERY Left 2010   left exernal iliac and superficial femoral artery PTCA and stenting    SOCIAL HISTORY: Social History   Socioeconomic History  . Marital status: Married    Spouse name: Enid Derry  . Number of children: 2  . Years of education: Not on file  . Highest education level: Not on file  Occupational History  . Occupation: drove tractors    Comment: retired  Tobacco Use  . Smoking status: Former Smoker    Packs/day: 0.75    Years: 50.00    Pack years: 37.50    Types: Cigarettes    Quit date: 09/27/2017    Years since quitting: 2.2  . Smokeless tobacco: Former Systems developer    Types: Chew  Substance and Sexual Activity  . Alcohol use: Not Currently    Comment: stopped drinking before cabg  . Drug use: Not Currently    Types: Marijuana    Comment: stopped smoking before CABG  . Sexual activity: Not Currently  Other Topics Concern  .  Not on file  Social History Narrative  . Not on file   Social Determinants of Health   Financial Resource Strain: Low Risk   . Difficulty of Paying Living Expenses: Not hard at all  Food Insecurity: No Food Insecurity  . Worried About Charity fundraiser in the Last Year: Never true  . Ran Out of Food in the Last Year: Never true  Transportation Needs: No Transportation Needs  . Lack of Transportation (Medical): No  . Lack of Transportation (Non-Medical): No  Physical Activity: Sufficiently Active  . Days of Exercise per Week: 5 days  . Minutes of Exercise per Session: 150+ min  Stress: No Stress Concern Present  .  Feeling of Stress : Not at all  Social Connections: Somewhat Isolated  . Frequency of Communication with Friends and Family: More than three times a week  . Frequency of Social Gatherings with Friends and Family: More than three times a week  . Attends Religious Services: Never  . Active Member of Clubs or Organizations: No  . Attends Archivist Meetings: Never  . Marital Status: Married  Human resources officer Violence:   . Fear of Current or Ex-Partner:   . Emotionally Abused:   [name] Kitchen Physically Abused:   . Sexually Abused:     FAMILY HISTORY: Family History  Problem Relation Age of Onset  . Brain cancer Mother   . Emphysema Father   . Cancer Brother     ALLERGIES:  is allergic to lipitor [atorvastatin]; zetia [ezetimibe]; lisinopril; protonix [pantoprazole]; and spironolactone.  MEDICATIONS:  Current Outpatient Medications  Medication Sig Dispense Refill  . aspirin EC 81 MG tablet Take 81 mg by mouth daily.    . carvedilol (COREG) 6.25 MG tablet Take 6.25 mg by mouth 2 (two) times daily with a meal.  3  . dexamethasone (DECADRON) 4 MG tablet Take 2 tablets (8 mg total) by mouth daily. Start the day after irinotecan chemotherapy for 2 days. 8 tablet 5  . diltiazem (CARDIZEM CD) 300 MG 24 hr capsule TAKE 1 CAPSULE BY MOUTH EVERY DAY 90 capsule 1  . dorzolamide-timolol (COSOPT) 22.3-6.8 MG/ML ophthalmic solution INSTILL ONE DROP INTO BOTH EYES TWICE DAILY    . ferrous sulfate 325 (65 FE) MG tablet TAKE 1 TABLET BY MOUTH EVERY DAY WITH BREAKFAST 90 tablet 1  . gabapentin (NEURONTIN) 300 MG capsule TAKE 2 CAPSULES (600 MG TOTAL) BY MOUTH 3 (THREE) TIMES DAILY. 540 capsule 0  . ibuprofen (ADVIL) 800 MG tablet Take 1 tablet (800 mg total) by mouth every 8 (eight) hours as needed. 30 tablet 0  . latanoprost (XALATAN) 0.005 % ophthalmic solution Place 1 drop into both eyes at bedtime.   3  . lidocaine-prilocaine (EMLA) cream APPLY TO AFFECTED AREA ONCE AS DIRECTED    . loperamide  (IMODIUM A-D) 2 MG tablet Take 1 tablet (2 mg total) by mouth See admin instructions. Take 2 at diarrhea onset , then 1 every 2hr until 12hrs with no BM. May take 2 every 4hrs at night. If diarrhea recurs repeat. 100 tablet 1  . Multiple Vitamin (MULTIVITAMIN WITH MINERALS) TABS tablet Take 1 tablet by mouth daily.    . OMEGA-3 FATTY ACIDS PO Take 1,200 mg by mouth daily.     [name] Kitchen omeprazole (PRILOSEC) 40 MG capsule TAKE 1 CAPSULE BY MOUTH EVERY DAY 90 capsule 1  . prochlorperazine (COMPAZINE) 10 MG tablet Take 1 tablet (10 mg total) by mouth every 6 (six) hours as needed (NAUSEA). [place]  tablet 1   No current facility-administered medications for this visit.   Facility-Administered Medications Ordered in Other Visits  Medication Dose Route Frequency Provider Last Rate Last Admin  . sodium chloride flush (NS) 0.9 % injection 10 mL  10 mL Intracatheter PRN Earlie Server, MD      . sodium chloride flush (NS) 0.9 % injection 10 mL  10 mL Intravenous Once Earlie Server, MD      . sodium chloride flush (NS) 0.9 % injection 10 mL  10 mL Intravenous Once Earlie Server, MD         PHYSICAL EXAMINATION: ECOG PERFORMANCE STATUS: 1 - Symptomatic but completely ambulatory Vitals:   01/07/20 0839  BP: (!) 147/76  Pulse: 81  Resp: 18  Temp: (!) 97.3 F (36.3 C)   Filed Weights   01/07/20 0839  Weight: 183 lb 6.4 oz (83.2 kg)    Physical Exam Constitutional:      General: He is not in acute distress. HENT:     Head: Normocephalic and atraumatic.  Eyes:     General: No scleral icterus. Cardiovascular:     Rate and Rhythm: Normal rate and regular rhythm.     Heart sounds: Normal heart sounds.  Pulmonary:     Effort: Pulmonary effort is normal. No respiratory distress.     Breath sounds: No wheezing.  Abdominal:     General: Bowel sounds are normal. There is no distension.     Palpations: Abdomen is soft.  Musculoskeletal:        General: No deformity. Normal range of motion.     Cervical back: Normal range  of motion and neck supple.  Skin:    General: Skin is warm and dry.     Findings: No erythema or rash.  Neurological:     Mental Status: He is alert and oriented to person, place, and time. Mental status is at baseline.     Cranial Nerves: No cranial nerve deficit.     Coordination: Coordination normal.  Psychiatric:        Mood and Affect: Mood normal.     LABORATORY DATA:  I have reviewed the data as listed Lab Results  Component Value Date   WBC 5.8 12/31/2019   HGB 10.6 (L) 12/31/2019   HCT 33.8 (L) 12/31/2019   MCV 91.6 12/31/2019   PLT 171 12/31/2019   Recent Labs    11/19/19 0918 12/05/19 1255 12/31/19 0858  NA 136 139 135  K 3.9 4.0 4.2  CL 104 108 104  CO2 '23 23 23  '$ GLUCOSE 156* 158* 265*  BUN '17 21 22  '$ CREATININE 0.80 0.79 0.98  CALCIUM 9.1 9.1 9.3  GFRNONAA >60 >60 >60  GFRAA >60 >60 >60  PROT 7.3 7.6 7.3  ALBUMIN 3.5 3.7 3.7  AST '18 18 20  '$ ALT '15 15 17  '$ ALKPHOS 60 62 76  BILITOT 0.4 0.4 0.5   Iron/TIBC/Ferritin/ %Sat    Component Value Date/Time   IRON 119 04/27/2019 0940   TIBC 363 04/27/2019 0940   FERRITIN 58 04/27/2019 0940   IRONPCTSAT 33 04/27/2019 0940      RADIOGRAPHIC STUDIES: I have personally reviewed the radiological images as listed and agreed with the findings in the report. DG Knee 1-2 Views Right  Result Date: 10/15/2019 CLINICAL DATA:  Lateral knee pain and swelling with difficulty walking. No known injury. History of colon cancer. EXAM: RIGHT KNEE - 1-2 VIEW COMPARISON:  None. FINDINGS: The mineralization and alignment are normal.  There is no evidence of acute fracture or dislocation. The joint spaces are relatively preserved. There is fragmentation of the patella superolaterally consistent with a bipartite patella (normal variant). There is a moderate size knee joint effusion. Femoropopliteal atherosclerosis is noted with vascular clips medially in the lower leg. IMPRESSION: 1. No acute osseous findings. 2. Moderate size knee  joint effusion. 3. Bipartite patella. Electronically Signed   By: Richardean Sale M.D.   On: 10/15/2019 14:31   CT CHEST W CONTRAST  Result Date: 01/02/2020 CLINICAL DATA:  74 year old male with history of pancreatic cancer with liver metastasis. Elevated PSA. EXAM: CT CHEST, ABDOMEN, AND PELVIS WITH CONTRAST TECHNIQUE: Multidetector CT imaging of the chest, abdomen and pelvis was performed following the standard protocol during bolus administration of intravenous contrast. CONTRAST:  121m OMNIPAQUE IOHEXOL 300 MG/ML  SOLN COMPARISON:  CT of the chest abdomen pelvis dated 11/05/2019. FINDINGS: CT CHEST FINDINGS Cardiovascular: There is no cardiomegaly or pericardial effusion. Three-vessel coronary vascular calcification and postsurgical changes of CABG. There is advanced atherosclerotic calcification of the thoracic aorta. No aneurysmal dilatation or dissection. The origins of the great vessels of the aortic arch appear patent as visualized. The central pulmonary arteries appear patent for the degree of opacification. Mediastinum/Nodes: No hilar or mediastinal adenopathy. The esophagus is grossly unremarkable. Several subcentimeter hypodense thyroid nodules. No followup recommended (Ref: J Am Coll Radiol. 2015 Feb;12(2): 143-50). No mediastinal fluid collection. Lungs/Pleura: There is a 4.2 x 3.0 cm ovoid soft tissue mass involving the medial left lung base similar to prior CT. 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 Several small subpleural nodules primarily in the right lung. A 3 mm right upper lobe nodule (series 5, image 64) similar or minimally increased in size. There is a 5 mm subpleural nodule in the right lower lobe (series 5, image 99) new since the prior CT. Additional 5 mm right lung base subpleural nodule (series 5, image 14) also appears new since the prior CT. There is an 8 mm nodule at the left lung base (series 5, image 111) which has significantly  increased in size since the prior CT. A 4 mm left lower lobe nodule (series 5, image 103) is new since the prior CT. These nodules are concerning for metastatic disease. There is no focal consolidation, pleural effusion, pneumothorax. The central airways are patent. Musculoskeletal: Median sternotomy wires. Right pectoral Port-A-Cath with tip at the cavoatrial junction. There is osteopenia with degenerative changes of the spine. No acute osseous pathology. No definite suspicious osseous lesions. CT ABDOMEN PELVIS FINDINGS No intra-abdominal free air or free fluid. Hepatobiliary: There is a background of fatty infiltration of the liver. Interval increase in the size and number of hepatic hypodense lesions consistent with progression of metastatic disease. The largest lesion measures approximately 3.4 x 2.8 cm in the right lobe of the liver (previously 2.0 x 1.1 cm). No intrahepatic biliary ductal dilatation. Cholecystectomy. Pancreas: There is a 4.0 x 3.0 cm (previously 3.5 x 2.8 cm) soft tissue lesion in the region of the uncinate process of the pancreas (series 3, image 64). There is mild diffuse pancreatic atrophy and dilatation of the main pancreatic duct measuring up to 5 mm in diameter and progressed since the prior CT. There is mild haziness of the peripancreatic fat, likely reactive. Spleen: Normal in size without focal abnormality. Adrenals/Urinary Tract: The adrenal glands are unremarkable. There is no hydronephrosis on either side. Bilateral renal cysts measuring up to 4.3 cm  in the inferior pole of the right kidney. There is symmetric enhancement and excretion of contrast by both kidneys. The visualized ureters and urinary bladder appear unremarkable. Stomach/Bowel: There is moderate stool throughout the colon. No bowel obstruction or active inflammation. The appendix is normal. Vascular/Lymphatic: Advanced aortoiliac atherosclerotic disease. The IVC is grossly unremarkable. Probable mild narrowing of the  porta splenic confluence secondary to pancreatic head mass. The SMV, splenic vein, and main portal vein are patent. No portal venous gas. Mildly enlarged pancreatico caval lymph node measures 15 mm (series 3, image 59). Reproductive: Mildly enlarged prostate gland. The seminal vesicles are symmetric. Other: None Musculoskeletal: Osteopenia with degenerative changes of the spine. L4-L5 posterior fusion and disc spacer. No acute osseous pathology. IMPRESSION: 1. Overall progression of hepatic and pulmonary metastatic disease since the prior CT of 11/05/2019. 2. Interval increase in the size of the pancreatic head mass and pancreatic duct dilatation. 3. A 15 mm peripancreatic lymph node, increased in size since the prior CT. 4. Ovoid soft tissue mass in the medial left lung base, similar to prior CT. 5. 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 6. No bowel obstruction. Normal appendix. 7. Aortic Atherosclerosis (ICD10-I70.0). Electronically Signed   By: Anner Crete M.D.   On: 01/02/2020 18:49   CT Chest W Contrast  Result Date: 11/05/2019 CLINICAL DATA:  Pancreatic cancer. Follow up for treatment response. EXAM: CT CHEST, ABDOMEN, AND PELVIS WITH CONTRAST TECHNIQUE: Multidetector CT imaging of the chest, abdomen and pelvis was performed following the standard protocol during bolus administration of intravenous contrast. CONTRAST:  161m OMNIPAQUE IOHEXOL 300 MG/ML  SOLN COMPARISON:  Prior CTs 05/16/2019 and 09/10/2019. PET-CT 06/04/2019. FINDINGS: CT CHEST FINDINGS Cardiovascular: Diffuse atherosclerosis of the aorta, great vessels and coronary arteries status post median sternotomy and CABG. No acute vascular findings are evident. Right IJ Port-A-Cath extends to the superior cavoatrial junction. Probable aortic valvular calcifications. The heart size is normal. There is no pericardial effusion. Mediastinum/Nodes: There are no enlarged mediastinal, hilar or axillary lymph nodes. Small  mediastinal lymph nodes are stable. There is stable minimal nodularity of the thyroid gland, consistent with a benign etiology. The trachea and esophagus appear normal. Lungs/Pleura: No pleural effusion or pneumothorax. Pleural based mass involving the left hemidiaphragm medially is grossly stable, measuring 4.2 x 2.8 cm on image 47/2. This was not hypermetabolic on PET-CT. Scattered tiny subpleural nodules are stable, likely benign. No new or enlarging pulmonary nodules. Musculoskeletal/Chest wall: No chest wall mass or suspicious osseous findings. There are stable degenerative changes within the thoracic spine and stable mild anterior wedging of the T5 vertebral body. Previous median sternotomy. CT ABDOMEN AND PELVIS FINDINGS Hepatobiliary: There are several ill-defined low-density hepatic lesions which are inapparent on the most recent study, suspicious for recurrent metastatic disease. Largest lesion posteriorly in the dome of the right hepatic lobe may reflect 2 adjacent lesions, measuring 2.1 x 1.2 cm on image 51/2. There is a smaller lesion more anteriorly in the right hepatic lobe measuring 1.1 cm on image 51/2. There is a 1.1 cm lesion inferiorly in the right lobe on image 62/2. Previous cholecystectomy. No significant biliary dilatation. Pancreas: The previously demonstrated low-density mass projecting medially from the uncinate process has resolved. There is stable isodense prominence of the pancreatic head compared with the most recent study, measuring approximately 3.6 x 2.2 cm on image 66/2. No pancreatic ductal dilatation or surrounding inflammation. Spleen: Normal in size without focal abnormality. Adrenals/Urinary Tract: Both adrenal glands appear normal. There  are bilateral renal cysts, largest in the lower pole of the right kidney, measuring 4.0 cm. In the lower pole of the left kidney, there is in 9 mm lesion (image 32/7) which is slightly denser than on the previous study, but unchanged in size,  probably a complex cyst. No evidence of urinary tract calculus or hydronephrosis. The bladder appears unremarkable for its degree of distention. Stomach/Bowel: No evidence of bowel wall thickening, distention or surrounding inflammatory change. Chronic gastric wall thickening, similar to previous study. Vascular/Lymphatic: There are no enlarged abdominal or pelvic lymph nodes. Stable small lymph nodes in the porta hepatis. Aortic and branch vessel atherosclerosis. No acute vascular findings. Reproductive: Stable mild enlargement of the prostate gland. Other: Intact anterior abdominal wall. No ascites or peritoneal nodularity. Musculoskeletal: No acute or significant osseous findings. Previous L4-5 fusion. IMPRESSION: 1. Several ill-defined low-density hepatic lesions are inapparent on the most recent study, suspicious for recurrent metastatic disease. Consider further evaluation with abdominal MRI or PET-CT. 2. Grossly stable appearance of the pancreatic head without evidence of local recurrence. No recurrent adenopathy in the chest or abdomen. 3. Stable small pulmonary nodules bilaterally consistent with a benign etiology. 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 Electronically Signed   By: Richardean Sale M.D.   On: 11/05/2019 11:46   CT ABDOMEN PELVIS W CONTRAST  Result Date: 01/02/2020 CLINICAL DATA:  74 year old male with history of pancreatic cancer with liver metastasis. Elevated PSA. EXAM: CT CHEST, ABDOMEN, AND PELVIS WITH CONTRAST TECHNIQUE: Multidetector CT imaging of the chest, abdomen and pelvis was performed following the standard protocol during bolus administration of intravenous contrast. CONTRAST:  142m OMNIPAQUE IOHEXOL 300 MG/ML  SOLN COMPARISON:  CT of the chest abdomen pelvis dated 11/05/2019. FINDINGS: CT CHEST FINDINGS Cardiovascular: There is no cardiomegaly or pericardial effusion.  Three-vessel coronary vascular calcification and postsurgical changes of CABG. There is advanced atherosclerotic calcification of the thoracic aorta. No aneurysmal dilatation or dissection. The origins of the great vessels of the aortic arch appear patent as visualized. The central pulmonary arteries appear patent for the degree of opacification. Mediastinum/Nodes: No hilar or mediastinal adenopathy. The esophagus is grossly unremarkable. Several subcentimeter hypodense thyroid nodules. No followup recommended (Ref: J Am Coll Radiol. 2015 Feb;12(2): 143-50). No mediastinal fluid collection. Lungs/Pleura: There is a 4.2 x 3.0 cm ovoid soft tissue mass involving the medial left lung base similar to prior CT. 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 Several small subpleural nodules primarily in the right lung. A 3 mm right upper lobe nodule (series 5, image 64) similar or minimally increased in size. There is a 5 mm subpleural nodule in the right lower lobe (series 5, image 99) new since the prior CT. Additional 5 mm right lung base subpleural nodule (series 5, image 14) also appears new since the prior CT. There is an 8 mm nodule at the left lung base (series 5, image 111) which has significantly increased in size since the prior CT. A 4 mm left lower lobe nodule (series 5, image 103) is new since the prior CT. These nodules are concerning for metastatic disease. There is no focal consolidation, pleural effusion, pneumothorax. The central airways are patent. Musculoskeletal: Median sternotomy wires. Right pectoral Port-A-Cath with tip at the cavoatrial junction. There is osteopenia with degenerative changes of the spine. No acute osseous pathology. No definite suspicious osseous lesions. CT ABDOMEN PELVIS FINDINGS  No intra-abdominal free air or free fluid. Hepatobiliary: There is a background of fatty infiltration of the liver. Interval increase in the size and number of hepatic  hypodense lesions consistent with progression of metastatic disease. The largest lesion measures approximately 3.4 x 2.8 cm in the right lobe of the liver (previously 2.0 x 1.1 cm). No intrahepatic biliary ductal dilatation. Cholecystectomy. Pancreas: There is a 4.0 x 3.0 cm (previously 3.5 x 2.8 cm) soft tissue lesion in the region of the uncinate process of the pancreas (series 3, image 64). There is mild diffuse pancreatic atrophy and dilatation of the main pancreatic duct measuring up to 5 mm in diameter and progressed since the prior CT. There is mild haziness of the peripancreatic fat, likely reactive. Spleen: Normal in size without focal abnormality. Adrenals/Urinary Tract: The adrenal glands are unremarkable. There is no hydronephrosis on either side. Bilateral renal cysts measuring up to 4.3 cm in the inferior pole of the right kidney. There is symmetric enhancement and excretion of contrast by both kidneys. The visualized ureters and urinary bladder appear unremarkable. Stomach/Bowel: There is moderate stool throughout the colon. No bowel obstruction or active inflammation. The appendix is normal. Vascular/Lymphatic: Advanced aortoiliac atherosclerotic disease. The IVC is grossly unremarkable. Probable mild narrowing of the porta splenic confluence secondary to pancreatic head mass. The SMV, splenic vein, and main portal vein are patent. No portal venous gas. Mildly enlarged pancreatico caval lymph node measures 15 mm (series 3, image 59). Reproductive: Mildly enlarged prostate gland. The seminal vesicles are symmetric. Other: None Musculoskeletal: Osteopenia with degenerative changes of the spine. L4-L5 posterior fusion and disc spacer. No acute osseous pathology. IMPRESSION: 1. Overall progression of hepatic and pulmonary metastatic disease since the prior CT of 11/05/2019. 2. Interval increase in the size of the pancreatic head mass and pancreatic duct dilatation. 3. A 15 mm peripancreatic lymph node,  increased in size since the prior CT. 4. Ovoid soft tissue mass in the medial left lung base, similar to prior CT. 5. 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 6. No bowel obstruction. Normal appendix. 7. Aortic Atherosclerosis (ICD10-I70.0). Electronically Signed   By: Anner Crete M.D.   On: 01/02/2020 18:49   CT Abdomen Pelvis W Contrast  Result Date: 11/05/2019 CLINICAL DATA:  Pancreatic cancer. Follow up for treatment response. EXAM: CT CHEST, ABDOMEN, AND PELVIS WITH CONTRAST TECHNIQUE: Multidetector CT imaging of the chest, abdomen and pelvis was performed following the standard protocol during bolus administration of intravenous contrast. CONTRAST:  142m OMNIPAQUE IOHEXOL 300 MG/ML  SOLN COMPARISON:  Prior CTs 05/16/2019 and 09/10/2019. PET-CT 06/04/2019. FINDINGS: CT CHEST FINDINGS Cardiovascular: Diffuse atherosclerosis of the aorta, great vessels and coronary arteries status post median sternotomy and CABG. No acute vascular findings are evident. Right IJ Port-A-Cath extends to the superior cavoatrial junction. Probable aortic valvular calcifications. The heart size is normal. There is no pericardial effusion. Mediastinum/Nodes: There are no enlarged mediastinal, hilar or axillary lymph nodes. Small mediastinal lymph nodes are stable. There is stable minimal nodularity of the thyroid gland, consistent with a benign etiology. The trachea and esophagus appear normal. Lungs/Pleura: No pleural effusion or pneumothorax. Pleural based mass involving the left hemidiaphragm medially is grossly stable, measuring 4.2 x 2.8 cm on image 47/2. This was not hypermetabolic on PET-CT. Scattered tiny subpleural nodules are stable, likely benign. No new or enlarging pulmonary nodules. Musculoskeletal/Chest wall: No chest wall mass or suspicious osseous findings. There are stable degenerative changes within the thoracic spine and stable mild  anterior wedging of the T5 vertebral body.  Previous median sternotomy. CT ABDOMEN AND PELVIS FINDINGS Hepatobiliary: There are several ill-defined low-density hepatic lesions which are inapparent on the most recent study, suspicious for recurrent metastatic disease. Largest lesion posteriorly in the dome of the right hepatic lobe may reflect 2 adjacent lesions, measuring 2.1 x 1.2 cm on image 51/2. There is a smaller lesion more anteriorly in the right hepatic lobe measuring 1.1 cm on image 51/2. There is a 1.1 cm lesion inferiorly in the right lobe on image 62/2. Previous cholecystectomy. No significant biliary dilatation. Pancreas: The previously demonstrated low-density mass projecting medially from the uncinate process has resolved. There is stable isodense prominence of the pancreatic head compared with the most recent study, measuring approximately 3.6 x 2.2 cm on image 66/2. No pancreatic ductal dilatation or surrounding inflammation. Spleen: Normal in size without focal abnormality. Adrenals/Urinary Tract: Both adrenal glands appear normal. There are bilateral renal cysts, largest in the lower pole of the right kidney, measuring 4.0 cm. In the lower pole of the left kidney, there is in 9 mm lesion (image 32/7) which is slightly denser than on the previous study, but unchanged in size, probably a complex cyst. No evidence of urinary tract calculus or hydronephrosis. The bladder appears unremarkable for its degree of distention. Stomach/Bowel: No evidence of bowel wall thickening, distention or surrounding inflammatory change. Chronic gastric wall thickening, similar to previous study. Vascular/Lymphatic: There are no enlarged abdominal or pelvic lymph nodes. Stable small lymph nodes in the porta hepatis. Aortic and branch vessel atherosclerosis. No acute vascular findings. Reproductive: Stable mild enlargement of the prostate gland. Other: Intact anterior abdominal wall. No ascites or peritoneal nodularity. Musculoskeletal: No acute or significant  osseous findings. Previous L4-5 fusion. IMPRESSION: 1. Several ill-defined low-density hepatic lesions are inapparent on the most recent study, suspicious for recurrent metastatic disease. Consider further evaluation with abdominal MRI or PET-CT. 2. Grossly stable appearance of the pancreatic head without evidence of local recurrence. No recurrent adenopathy in the chest or abdomen. 3. Stable small pulmonary nodules bilaterally consistent with a benign etiology. 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 Electronically Signed   By: Richardean Sale M.D.   On: 11/05/2019 11:46      ASSESSMENT & PLAN:  1. Pancreatic cancer metastasized to liver ([place])   2. Anemia due to antineoplastic chemotherapy   3. Encounter for antineoplastic chemotherapy   4. Goals of care, counseling/discussion   5. Hyperglycemia     #Metastatic pancreatic cancer- liver mets, hilar nodal metastasis, ?bone mets, CA 19-9 has trended up to 22566 at last visit.  CT was independently viewed by me and discussed with patient. Progression of liver metastasis, pancreatic lesion, mild nodules concerning for metastasis. I recommend switch to third line treatment with 5-FU and liposomal irinotecan.  Given patient's pulmonary status, I will start with dose to dose reduced liposomal irinotecan with 50 mg/m, if he tolerates, will go to standard dose of 70 mg/m. Rationale chemotherapy and potential side effects were discussed with patient.  Iron Patient pickup antiemetics and antidiarrheal medication.  Instructions of patient were discussed with patient in details. Goal of care was discussed with patient again.  He understands that his condition is not curable. Continue follow-up with palliative care. Anemia, hemoglobin 10.8, monitor. # Hyperglycemia, no history of DM. I checked his A1c which came back 6.7.  He may have developed DM or maybe due  to pancreatic cancer.  I advise patient to set up appointment with PCP to start medication. - Secure message sent to patient's PCP His glucose is 400s today.  All questions were answered. The patient knows to call the clinic with any problems questions or concerns. Follow-up in 1 week for evaluation of chemotherapy tolerability.  2 weeks for evaluation prior to next treatments.   Earlie Server, MD, PhD Hematology Oncology [place] at [facility] 01/07/2020

## 2020-01-07 NOTE — Telephone Encounter (Signed)
Patient's glucose and A1C is in diabetic range.  Dr. Tasia Catchings would like for him to see PCP for DM eval and management and advise no need for Dexamethason.  Appt scheduled with Carles Collet, PA @ [facility] for 01/08/20 @ 1:40.  Patient informed.

## 2020-01-08 ENCOUNTER — Encounter: Payer: Self-pay | Admitting: Physician Assistant

## 2020-01-08 ENCOUNTER — Ambulatory Visit (INDEPENDENT_AMBULATORY_CARE_PROVIDER_SITE_OTHER): Payer: Medicare Other | Admitting: Physician Assistant

## 2020-01-08 ENCOUNTER — Other Ambulatory Visit: Payer: Self-pay

## 2020-01-08 VITALS — BP 166/74 | HR 82 | Temp 96.9°F | Wt 181.0 lb

## 2020-01-08 DIAGNOSIS — E0865 Diabetes mellitus due to underlying condition with hyperglycemia: Secondary | ICD-10-CM

## 2020-01-08 DIAGNOSIS — C259 Malignant neoplasm of pancreas, unspecified: Secondary | ICD-10-CM | POA: Diagnosis not present

## 2020-01-08 DIAGNOSIS — G629 Polyneuropathy, unspecified: Secondary | ICD-10-CM | POA: Diagnosis not present

## 2020-01-08 NOTE — Progress Notes (Signed)
Patient: Craig [name] Male    DOB: 06-Aug-1946   74 y.o.   MRN: [id number] Visit Date: 01/08/2020  Today's Provider: Trinna Post, PA-C   Chief Complaint  Patient presents with  . Diabetes   Subjective:     Patient with a history of Stage IV metastatic pancreatic cancer presents today for new diagnosis of diabetes mellitus. He was diagnosed with pancreatic cancer 05/2019. Most recent CT 01/03/2020 showed progression of his pancreatic mass and progressed metastatic lung and liver lesions. His A1c on 12/31/2019 was 6.7%. Prior A1c in 04/2019 was 6.0%. patient has history of prediabetes 3 years ago when his A1c was 5.8%. he reports he was eating a lot of M&Ms and once he stopped doing that, his sugars improved back to normal range.   HTN: Currently on cardizem.   BP Readings from Last 5 Encounters:  01/08/20 (!) 166/74  01/07/20 (!) 147/76  12/31/19 (!) 177/75  12/10/19 (!) 159/78  12/05/19 (!) 162/78   He has recent started a new course of chemotherapy. After one day of chemotherapy, he is to take dexamethasone for two days.   He is contemplating getting the COVID vaccination.   Diabetes He presents for his initial diabetic visit. He has type 2 diabetes mellitus. There are no diabetic complications. Risk factors for coronary artery disease include male sex. When asked about current treatments, none were reported.   Lab Results  Component Value Date   HGBA1C 6.7 (H) 12/31/2019   Wt Readings from Last 3 Encounters:  01/08/20 181 lb (82.1 kg)  01/07/20 183 lb 6.4 oz (83.2 kg)  12/31/19 185 lb 1.6 oz (84 kg)     Allergies  Allergen Reactions  . Lipitor [Atorvastatin] Other (See Comments)    MYALGIA  . Zetia [Ezetimibe] Other (See Comments)    MYALGIA  . Lisinopril Cough  . Protonix [Pantoprazole] Other (See Comments)    headache  . Spironolactone Other (See Comments)    Breast tenderness     Current Outpatient Medications:  .  aspirin EC 81 MG tablet, Take  81 mg by mouth daily., Disp: , Rfl:  .  carvedilol (COREG) 6.25 MG tablet, Take 6.25 mg by mouth 2 (two) times daily with a meal., Disp: , Rfl: 3 .  diltiazem (CARDIZEM CD) 300 MG 24 hr capsule, TAKE 1 CAPSULE BY MOUTH EVERY DAY, Disp: 90 capsule, Rfl: 1 .  dorzolamide-timolol (COSOPT) 22.3-6.8 MG/ML ophthalmic solution, INSTILL ONE DROP INTO BOTH EYES TWICE DAILY, Disp: , Rfl:  .  ferrous sulfate 325 (65 FE) MG tablet, TAKE 1 TABLET BY MOUTH EVERY DAY WITH BREAKFAST, Disp: 90 tablet, Rfl: 1 .  gabapentin (NEURONTIN) 300 MG capsule, TAKE 2 CAPSULES (600 MG TOTAL) BY MOUTH 3 (THREE) TIMES DAILY., Disp: 540 capsule, Rfl: 0 .  ibuprofen (ADVIL) 800 MG tablet, Take 1 tablet (800 mg total) by mouth every 8 (eight) hours as needed., Disp: 30 tablet, Rfl: 0 .  latanoprost (XALATAN) 0.005 % ophthalmic solution, Place 1 drop into both eyes at bedtime. , Disp: , Rfl: 3 .  lidocaine-prilocaine (EMLA) cream, APPLY TO AFFECTED AREA ONCE AS DIRECTED, Disp: , Rfl:  .  Multiple Vitamin (MULTIVITAMIN WITH MINERALS) TABS tablet, Take 1 tablet by mouth daily., Disp: , Rfl:  .  OMEGA-3 FATTY ACIDS PO, Take 1,200 mg by mouth daily. , Disp: , Rfl:  .  omeprazole (PRILOSEC) 40 MG capsule, TAKE 1 CAPSULE BY MOUTH EVERY DAY, Disp: 90 capsule, Rfl: 1 .  prochlorperazine (COMPAZINE) 10 MG tablet, Take 1 tablet (10 mg total) by mouth every 6 (six) hours as needed (NAUSEA)., Disp: 30 tablet, Rfl: 1 .  loperamide (IMODIUM A-D) 2 MG tablet, Take 1 tablet (2 mg total) by mouth See admin instructions. Take 2 at diarrhea onset , then 1 every 2hr until 12hrs with no BM. May take 2 every 4hrs at night. If diarrhea recurs repeat. (Patient not taking: Reported on 01/08/2020), Disp: 100 tablet, Rfl: 1 No current facility-administered medications for this visit.  Facility-Administered Medications Ordered in Other Visits:  .  sodium chloride flush (NS) 0.9 % injection 10 mL, 10 mL, Intracatheter, PRN, Earlie Server, MD .  sodium chloride flush  (NS) 0.9 % injection 10 mL, 10 mL, Intravenous, Once, Earlie Server, MD  Review of Systems  Social History   Tobacco Use  . Smoking status: Former Smoker    Packs/day: 0.75    Years: 50.00    Pack years: 37.50    Types: Cigarettes    Quit date: 09/27/2017    Years since quitting: 2.2  . Smokeless tobacco: Former Systems developer    Types: Chew  Substance Use Topics  . Alcohol use: Not Currently    Comment: stopped drinking before cabg      Objective:   BP (!) 166/74 (BP Location: Left Arm, Patient Position: Sitting, Cuff Size: Large)   Pulse 82   Temp (!) 96.9 F (36.1 C) (Temporal)   Wt 181 lb (82.1 kg)   SpO2 99%   BMI 26.73 kg/m  Vitals:   01/08/20 1350  BP: (!) 166/74  Pulse: 82  Temp: (!) 96.9 F (36.1 C)  TempSrc: Temporal  SpO2: 99%  Weight: 181 lb (82.1 kg)  Body mass index is 26.73 kg/m.   Physical Exam   No results found for any visits on 01/08/20.     Assessment & Plan    1. Diabetes mellitus due to underlying condition with hyperglycemia, without long-term current use of insulin (HCC)  His elevated C-peptide supports insulin resistant diabetes rather than pancreatic failure, at least at this point. I have counseled patient that due to his pancreatic cancer, he may progress into a more insulin dependent diabetes. Talked with patient about the risks and benefits of treating patient with medication in the context of terminal illness. Patient would like to proceed with treatment. I would like to avoid metformin due to metastatic liver lesions and any confusion that may arise from elevated liver enzymes with metformin. I would also like to avoid glipizide due to its action directly on the pancrease. Wilder Glade is an option though patient has had hpotensive episodes and dehydration previously with chemo treatment. Will start with Januvia 100 mg daily and see him in one month.   Additionally, I have signed him up with the [facility] vaccination clinic to get his COVID vaccine  on Friday 01/11/2020.   - C-peptide - Urine Microalbumin w/creat. ratio  2. Malignant neoplasm of pancreas, unspecified location of malignancy ([place])   3. Neuropathy  The entirety of the information documented in the History of Present Illness, Review of Systems and Physical Exam were personally obtained by me. Portions of this information were initially documented by [facility] and reviewed by me for thoroughness and accuracy.        Trinna Post, PA-C  [facility]

## 2020-01-09 ENCOUNTER — Inpatient Hospital Stay: Payer: Medicare Other

## 2020-01-09 ENCOUNTER — Telehealth: Payer: Self-pay

## 2020-01-09 VITALS — BP 159/65 | HR 72 | Temp 97.6°F

## 2020-01-09 DIAGNOSIS — Z5111 Encounter for antineoplastic chemotherapy: Secondary | ICD-10-CM | POA: Diagnosis not present

## 2020-01-09 DIAGNOSIS — C787 Secondary malignant neoplasm of liver and intrahepatic bile duct: Secondary | ICD-10-CM

## 2020-01-09 DIAGNOSIS — C259 Malignant neoplasm of pancreas, unspecified: Secondary | ICD-10-CM

## 2020-01-09 LAB — MICROALBUMIN / CREATININE URINE RATIO
Creatinine, Urine: 170.3 mg/dL
Microalb/Creat Ratio: 14 mg/g creat (ref 0–29)
Microalbumin, Urine: 23.6 ug/mL

## 2020-01-09 LAB — C-PEPTIDE: C-Peptide: 6.4 ng/mL — ABNORMAL HIGH (ref 1.1–4.4)

## 2020-01-09 MED ORDER — HEPARIN SOD (PORK) LOCK FLUSH 100 UNIT/ML IV SOLN
INTRAVENOUS | Status: AC
  Filled 2020-01-09: qty 5

## 2020-01-09 MED ORDER — HEPARIN SOD (PORK) LOCK FLUSH 100 UNIT/ML IV SOLN
500.0000 [IU] | Freq: Once | INTRAVENOUS | Status: AC | PRN
  Administered 2020-01-09: 500 [IU]
  Filled 2020-01-09: qty 5

## 2020-01-09 MED ORDER — SODIUM CHLORIDE 0.9% FLUSH
10.0000 mL | INTRAVENOUS | Status: DC | PRN
  Administered 2020-01-09: 13:00:00 10 mL
  Filled 2020-01-09: qty 10

## 2020-01-09 MED ORDER — SITAGLIPTIN PHOSPHATE 100 MG PO TABS: 100.0000 mg | ORAL_TABLET | Freq: Every day | ORAL | 1 refills | Status: DC

## 2020-01-09 NOTE — Telephone Encounter (Signed)
Unable to reach patient at this time, if patient returns call back okay for [facility] triage to advise. KW

## 2020-01-09 NOTE — Telephone Encounter (Signed)
Attempted to notify pt regarding lab message. No answer after calling both numbers. Left VM to return call to the office.

## 2020-01-09 NOTE — Telephone Encounter (Signed)
-----   Message from Trinna Post, [place] sent at 01/09/2020  1:14 PM EDT ----- Can we let patient know his labs show he is making insulin and his diabetes is probably from insulin resistance rather than his pancreas not working. I have sent him in Januvia to take one [facility]. I will probably need to fill out insurance forms for this so it likely won't go in right away. We'll see him at follow up in 1 month.

## 2020-01-10 NOTE — Telephone Encounter (Signed)
Pt given result per Carles Collet, "Can we let patient know his labs show he is making insulin and his diabetes is probably from insulin resistance rather than his pancreas not working. I have sent him in Januvia to take one pill [facility]. I will probably need to fill out insurance forms for this so it likely won't go in right away. We'll see him at follow up in 1 month."; he verbalized understanding.

## 2020-01-11 ENCOUNTER — Ambulatory Visit: Payer: Medicare Other | Attending: Internal Medicine

## 2020-01-11 ENCOUNTER — Ambulatory Visit: Payer: Medicare Other

## 2020-01-11 DIAGNOSIS — Z23 Encounter for immunization: Secondary | ICD-10-CM

## 2020-01-11 NOTE — Progress Notes (Signed)
   Covid-19 Vaccination Clinic  Name:  Craig [name]    MRN: [id number] DOB: January 10, 1946  01/11/2020  [name] was observed post Covid-19 immunization for 15 minutes without incident. He was provided with Vaccine Information Sheet and instruction to access the V-Safe system.   [name] was instructed to call 911 with any severe reactions post vaccine: [name] Kitchen Difficulty breathing  . Swelling of face and throat  . A fast heartbeat  . A bad rash all over body  . Dizziness and weakness   Immunizations Administered    Name Date Dose VIS Date Route   Pfizer COVID-19 Vaccine 01/11/2020 10:41 AM 0.3 mL 09/21/2019 Intramuscular   Manufacturer: [place]   Lot: [id number]   [place]: [id number]

## 2020-01-14 ENCOUNTER — Other Ambulatory Visit: Payer: Self-pay | Admitting: Physician Assistant

## 2020-01-14 NOTE — Telephone Encounter (Signed)
Requested Prescriptions  Pending Prescriptions Disp Refills  . gabapentin (NEURONTIN) 300 MG capsule [Pharmacy Med Name: GABAPENTIN 300 MG CAPSULE] 540 capsule 0    Sig: TAKE 2 CAPSULES (600 MG TOTAL) BY MOUTH 3 (THREE) TIMES DAILY.     Neurology: Anticonvulsants - gabapentin Passed - 01/14/2020  1:04 PM      Passed - Valid encounter within last 12 months    Recent Outpatient Visits          6 days ago Diabetes mellitus due to underlying condition with hyperglycemia, without long-term current use of insulin [facility])   [place], [place], [place]   4 months ago Impacted cerumen of left ear   [place], [place]   5 months ago Abnormal LFTs   [place], [place], [place]   8 months ago Right upper quadrant abdominal pain   [place], [place]   8 months ago Intermittent claudication [facility])   [facility] Trinna Post, [place]      Future Appointments            In 3 weeks Trinna Post, [place], [facility]

## 2020-01-14 NOTE — Progress Notes (Signed)
Pharmacist Chemotherapy Monitoring - Follow Up Assessment    I verify that I have reviewed each item in the below checklist:  . Regimen for the patient is scheduled for the appropriate day and plan matches scheduled date. [name] Kitchen Appropriate non-routine labs are ordered dependent on drug ordered. . If applicable, additional medications reviewed and ordered per protocol based on lifetime cumulative doses and/or treatment regimen.   Plan for follow-up and/or issues identified: No . I-vent associated with next due treatment: No . MD and/or nursing notified: No  Nariyah Osias K 01/14/2020 11:29 AM

## 2020-01-15 ENCOUNTER — Inpatient Hospital Stay: Payer: Medicare Other

## 2020-01-15 ENCOUNTER — Encounter: Payer: Self-pay | Admitting: Oncology

## 2020-01-15 ENCOUNTER — Emergency Department
Admission: EM | Admit: 2020-01-15 | Discharge: 2020-01-15 | Disposition: A | Discharge status: Home / Self Care | Payer: Medicare Other | Attending: Student | Admitting: Student

## 2020-01-15 ENCOUNTER — Inpatient Hospital Stay: Payer: Medicare Other | Attending: Oncology

## 2020-01-15 ENCOUNTER — Inpatient Hospital Stay (HOSPITAL_BASED_OUTPATIENT_CLINIC_OR_DEPARTMENT_OTHER): Payer: Medicare Other | Admitting: Oncology

## 2020-01-15 ENCOUNTER — Other Ambulatory Visit: Payer: Self-pay

## 2020-01-15 VITALS — BP 117/72 | HR 80 | Temp 98.3°F | Resp 16 | Wt 169.9 lb

## 2020-01-15 DIAGNOSIS — N281 Cyst of kidney, acquired: Secondary | ICD-10-CM | POA: Insufficient documentation

## 2020-01-15 DIAGNOSIS — E1110 Type 2 diabetes mellitus with ketoacidosis without coma: Secondary | ICD-10-CM | POA: Insufficient documentation

## 2020-01-15 DIAGNOSIS — I10 Essential (primary) hypertension: Secondary | ICD-10-CM | POA: Diagnosis not present

## 2020-01-15 DIAGNOSIS — E876 Hypokalemia: Secondary | ICD-10-CM | POA: Insufficient documentation

## 2020-01-15 DIAGNOSIS — M8580 Other specified disorders of bone density and structure, unspecified site: Secondary | ICD-10-CM | POA: Insufficient documentation

## 2020-01-15 DIAGNOSIS — Z809 Family history of malignant neoplasm, unspecified: Secondary | ICD-10-CM | POA: Insufficient documentation

## 2020-01-15 DIAGNOSIS — R739 Hyperglycemia, unspecified: Secondary | ICD-10-CM

## 2020-01-15 DIAGNOSIS — Z87891 Personal history of nicotine dependence: Secondary | ICD-10-CM | POA: Insufficient documentation

## 2020-01-15 DIAGNOSIS — R1013 Epigastric pain: Secondary | ICD-10-CM

## 2020-01-15 DIAGNOSIS — Z9049 Acquired absence of other specified parts of digestive tract: Secondary | ICD-10-CM | POA: Insufficient documentation

## 2020-01-15 DIAGNOSIS — Z7982 Long term (current) use of aspirin: Secondary | ICD-10-CM | POA: Diagnosis not present

## 2020-01-15 DIAGNOSIS — C787 Secondary malignant neoplasm of liver and intrahepatic bile duct: Secondary | ICD-10-CM

## 2020-01-15 DIAGNOSIS — C7951 Secondary malignant neoplasm of bone: Secondary | ICD-10-CM | POA: Insufficient documentation

## 2020-01-15 DIAGNOSIS — C259 Malignant neoplasm of pancreas, unspecified: Secondary | ICD-10-CM | POA: Diagnosis not present

## 2020-01-15 DIAGNOSIS — C771 Secondary and unspecified malignant neoplasm of intrathoracic lymph nodes: Secondary | ICD-10-CM | POA: Insufficient documentation

## 2020-01-15 DIAGNOSIS — Z888 Allergy status to other drugs, medicaments and biological substances status: Secondary | ICD-10-CM | POA: Insufficient documentation

## 2020-01-15 DIAGNOSIS — I2510 Atherosclerotic heart disease of native coronary artery without angina pectoris: Secondary | ICD-10-CM | POA: Diagnosis not present

## 2020-01-15 DIAGNOSIS — R5383 Other fatigue: Secondary | ICD-10-CM | POA: Insufficient documentation

## 2020-01-15 DIAGNOSIS — K760 Fatty (change of) liver, not elsewhere classified: Secondary | ICD-10-CM | POA: Insufficient documentation

## 2020-01-15 DIAGNOSIS — Z951 Presence of aortocoronary bypass graft: Secondary | ICD-10-CM | POA: Insufficient documentation

## 2020-01-15 DIAGNOSIS — Z79899 Other long term (current) drug therapy: Secondary | ICD-10-CM | POA: Diagnosis not present

## 2020-01-15 DIAGNOSIS — K219 Gastro-esophageal reflux disease without esophagitis: Secondary | ICD-10-CM | POA: Insufficient documentation

## 2020-01-15 DIAGNOSIS — C257 Malignant neoplasm of other parts of pancreas: Secondary | ICD-10-CM | POA: Insufficient documentation

## 2020-01-15 DIAGNOSIS — Z8507 Personal history of malignant neoplasm of pancreas: Secondary | ICD-10-CM | POA: Insufficient documentation

## 2020-01-15 DIAGNOSIS — C7800 Secondary malignant neoplasm of unspecified lung: Secondary | ICD-10-CM | POA: Insufficient documentation

## 2020-01-15 DIAGNOSIS — Z5111 Encounter for antineoplastic chemotherapy: Secondary | ICD-10-CM | POA: Insufficient documentation

## 2020-01-15 DIAGNOSIS — D631 Anemia in chronic kidney disease: Secondary | ICD-10-CM | POA: Insufficient documentation

## 2020-01-15 DIAGNOSIS — Z794 Long term (current) use of insulin: Secondary | ICD-10-CM | POA: Insufficient documentation

## 2020-01-15 DIAGNOSIS — Z95828 Presence of other vascular implants and grafts: Secondary | ICD-10-CM

## 2020-01-15 DIAGNOSIS — Z836 Family history of other diseases of the respiratory system: Secondary | ICD-10-CM | POA: Insufficient documentation

## 2020-01-15 DIAGNOSIS — T451X5A Adverse effect of antineoplastic and immunosuppressive drugs, initial encounter: Secondary | ICD-10-CM | POA: Insufficient documentation

## 2020-01-15 DIAGNOSIS — E042 Nontoxic multinodular goiter: Secondary | ICD-10-CM | POA: Insufficient documentation

## 2020-01-15 DIAGNOSIS — Z808 Family history of malignant neoplasm of other organs or systems: Secondary | ICD-10-CM | POA: Insufficient documentation

## 2020-01-15 LAB — CBC WITH DIFFERENTIAL/PLATELET
Abs Immature Granulocytes: 0.06 10*3/uL (ref 0.00–0.07)
Basophils Absolute: 0 10*3/uL (ref 0.0–0.1)
Basophils Relative: 0 %
Eosinophils Absolute: 0.2 10*3/uL (ref 0.0–0.5)
Eosinophils Relative: 2 %
HCT: 35.4 % — ABNORMAL LOW (ref 39.0–52.0)
Hemoglobin: 11.5 g/dL — ABNORMAL LOW (ref 13.0–17.0)
Immature Granulocytes: 1 %
Lymphocytes Relative: 16 %
Lymphs Abs: 1.3 10*3/uL (ref 0.7–4.0)
MCH: 28.8 pg (ref 26.0–34.0)
MCHC: 32.5 g/dL (ref 30.0–36.0)
MCV: 88.5 fL (ref 80.0–100.0)
Monocytes Absolute: 0.5 10*3/uL (ref 0.1–1.0)
Monocytes Relative: 6 %
Neutro Abs: 6.2 10*3/uL (ref 1.7–7.7)
Neutrophils Relative %: 75 %
Platelets: 238 10*3/uL (ref 150–400)
RBC: 4 MIL/uL — ABNORMAL LOW (ref 4.22–5.81)
RDW: 14 % (ref 11.5–15.5)
WBC: 8.4 10*3/uL (ref 4.0–10.5)
nRBC: 0 % (ref 0.0–0.2)

## 2020-01-15 LAB — COMPREHENSIVE METABOLIC PANEL
ALT: 54 U/L — ABNORMAL HIGH (ref 0–44)
AST: 35 U/L (ref 15–41)
Albumin: 3.5 g/dL (ref 3.5–5.0)
Alkaline Phosphatase: 86 U/L (ref 38–126)
Anion gap: 9 (ref 5–15)
BUN: 29 mg/dL — ABNORMAL HIGH (ref 8–23)
CO2: 23 mmol/L (ref 22–32)
Calcium: 8.9 mg/dL (ref 8.9–10.3)
Chloride: 97 mmol/L — ABNORMAL LOW (ref 98–111)
Creatinine, Ser: 1.23 mg/dL (ref 0.61–1.24)
GFR calc Af Amer: >60 mL/min (ref >60)
GFR calc non Af Amer: 58 mL/min — ABNORMAL LOW (ref >60)
Glucose, Bld: 565 mg/dL (ref 70–99)
Potassium: 4.4 mmol/L (ref 3.5–5.1)
Sodium: 129 mmol/L — ABNORMAL LOW (ref 135–145)
Total Bilirubin: 0.8 mg/dL (ref 0.3–1.2)
Total Protein: 7.2 g/dL (ref 6.5–8.1)

## 2020-01-15 LAB — BASIC METABOLIC PANEL
Anion gap: 10 (ref 5–15)
BUN: 29 mg/dL — ABNORMAL HIGH (ref 8–23)
CO2: 23 mmol/L (ref 22–32)
Calcium: 8.8 mg/dL — ABNORMAL LOW (ref 8.9–10.3)
Chloride: 98 mmol/L (ref 98–111)
Creatinine, Ser: 1.13 mg/dL (ref 0.61–1.24)
GFR calc Af Amer: >60 mL/min (ref >60)
GFR calc non Af Amer: >60 mL/min (ref >60)
Glucose, Bld: 539 mg/dL (ref 70–99)
Potassium: 4.5 mmol/L (ref 3.5–5.1)
Sodium: 131 mmol/L — ABNORMAL LOW (ref 135–145)

## 2020-01-15 LAB — URINALYSIS, COMPLETE (UACMP) WITH MICROSCOPIC
Bacteria, UA: NONE SEEN
Bilirubin Urine: NEGATIVE
Glucose, UA: >=500 mg/dL — AB
Hgb urine dipstick: NEGATIVE
Ketones, ur: NEGATIVE mg/dL
Leukocytes,Ua: NEGATIVE
Nitrite: NEGATIVE
Protein, ur: NEGATIVE mg/dL
Specific Gravity, Urine: 1.032 — ABNORMAL HIGH (ref 1.005–1.030)
Squamous Epithelial / HPF: NONE SEEN (ref 0–5)
pH: 5 (ref 5.0–8.0)

## 2020-01-15 LAB — BLOOD GAS, VENOUS
Acid-Base Excess: 0.5 mmol/L (ref 0.0–2.0)
Bicarbonate: 26 mmol/L (ref 20.0–28.0)
O2 Saturation: 43.7 %
Patient temperature: 37
pCO2, Ven: 44 mmHg (ref 44.0–60.0)
pH, Ven: 7.38 (ref 7.250–7.430)

## 2020-01-15 LAB — GLUCOSE, CAPILLARY
Glucose-Capillary: 397 mg/dL — ABNORMAL HIGH (ref 70–99)
Glucose-Capillary: 298 mg/dL — ABNORMAL HIGH (ref 70–99)

## 2020-01-15 LAB — CBC
HCT: 36.5 % — ABNORMAL LOW (ref 39.0–52.0)
Hemoglobin: 11.7 g/dL — ABNORMAL LOW (ref 13.0–17.0)
MCH: 28.7 pg (ref 26.0–34.0)
MCHC: 32.1 g/dL (ref 30.0–36.0)
MCV: 89.5 fL (ref 80.0–100.0)
Platelets: 235 10*3/uL (ref 150–400)
RBC: 4.08 MIL/uL — ABNORMAL LOW (ref 4.22–5.81)
RDW: 14 % (ref 11.5–15.5)
WBC: 8.2 10*3/uL (ref 4.0–10.5)
nRBC: 0 % (ref 0.0–0.2)

## 2020-01-15 MED ORDER — INSULIN ASPART 100 UNIT/ML ~~LOC~~ SOLN
5.0000 [IU] | Freq: Once | SUBCUTANEOUS | Status: AC
  Administered 2020-01-15: 5 [IU] via SUBCUTANEOUS
  Filled 2020-01-15: qty 1

## 2020-01-15 MED ORDER — OXYCODONE HCL 5 MG PO TABS: 5.0000 mg | ORAL_TABLET | Freq: Four times a day (QID) | ORAL | 0 refills | Status: DC | PRN

## 2020-01-15 MED ORDER — SODIUM CHLORIDE 0.9% FLUSH
10.0000 mL | Freq: Once | INTRAVENOUS | Status: AC
  Administered 2020-01-15: 10 mL via INTRAVENOUS
  Filled 2020-01-15: qty 10

## 2020-01-15 MED ORDER — HEPARIN SOD (PORK) LOCK FLUSH 100 UNIT/ML IV SOLN
500.0000 [IU] | Freq: Once | INTRAVENOUS | Status: AC
  Administered 2020-01-15: 500 [IU] via INTRAVENOUS
  Filled 2020-01-15: qty 5

## 2020-01-15 MED ORDER — SODIUM CHLORIDE 0.9 % IV BOLUS
1000.0000 mL | Freq: Once | INTRAVENOUS | Status: AC
  Administered 2020-01-15: 1000 mL via INTRAVENOUS

## 2020-01-15 NOTE — Discharge Instructions (Addendum)
Thank you for letting us take care of you in the emergency department today.   Please continue to take any regular, prescribed medications.   Please call your pharmacy and your primary care doctor to clarify the status of your new diabetes medication.  Please do this first seeing tomorrow morning.  Please return to the ER for any new or worsening symptoms.

## 2020-01-15 NOTE — Progress Notes (Signed)
Hematology/Oncology  Follow up note [facility] [phone]) [phone] [phone]   Patient Care Team: Paulene Floor as PCP - General (Physician Assistant) Clent Jacks, RN as Oncology Nurse Navigator Earlie Server, MD as Consulting Physician (Hematology and Oncology)  REFERRING PROVIDER: Trinna Post, PA-C  CHIEF COMPLAINTS/REASON FOR VISIT:  Follow up for treatment of pancreatic cancer  HISTORY OF PRESENTING ILLNESS:   Craig [name] is a  74 y.o.  male with PMH listed below, including CABG x5, PVD, hypertension, former tobacco abuse, former alcohol abuse, iron deficiency anemia, was seen in consultation at the request of  Terrilee Croak, Adriana M, PA-C  for evaluation of abnormal CT Patient recently presented to primary care provider complaining for abdominal pain radiating to his back for about 10 days.  Patient uses Tylenol as needed for pain. Work-up showed elevated amylase, lipase, mild anemia with hemoglobin of 11.3 Acute hepatitis panel negative. 05/16/2019 CT abdomen pelvis with contrast showed poorly marginated heterogeneous hypodense 2.9 cm pancreatic mass at the uncinate process, worrisome for primary pancreatic cancer.  No biliary or pancreatic duct dilation. Several ill defined hypodense liver masses scattered throughout the liver, largest 3.1 cm in the segment 6 right liver lobe. Nonspecific portacaval and aortocaval adenopathy. Extreme medial left lung base 4.4 cm pleural-based mass probably a pleural metastasis.  Additional tiny pulmonary nodules scattered at the right lung base are indeterminate. Mild prostate or megaly  Patient was referred to heme-onc for further evaluation. Patient reports that his abdominal pain was 10 out of 10 a few days ago, he uses Tylenol as needed. Today his pain is getting better, 2 out of 10.  Denies any nausea, vomiting, diarrhea, fever or chills. Married, lives with wife.  Appetite is good.  He  weighs 195 pounds today at the clinic. His weight was 204 pounds back in May 2020.  He had a history of 37.5-pack-year smoking history quitting 2018. No longer drinking alcohol.  # ultrasound-guided liver biopsy on 05/21/2019.  Pathology showed fragments of benign hepatic parenchyma showing minimal macro vascular steatosis, otherwise no significant histo pathologic change. Single core of renal tissue showing approximately 25% glomerulosclerosis  # #History of CAD, status post CABG x5.  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  # Omniseq test showed PD-L1 50% TPS, TMB high, negative for BRCA1/2 or NTRK fusion.  MSI could not be completed.  Patient declines genetic testing  INTERVAL HISTORY Craig [name] is a 74 y.o. male who has above history reviewed by me today presents for evaluation of chemotherapy tolerability for metastatic pancreatic cancer.   Problems and complaints are listed below: He received third line chemotherapy treatment 5-FU with liposomal irinotecan. Status post chemotherapy 1 week ago.  He tolerates chemotherapy well.  Denies any nausea, vomiting, fever, chills, diarrhea.  His glucose level has been running high.  I discussed with his primary care provider who saw him last week and I recommend patient to start Januvia.  Insulin option was discussed with him and at that time he was not interested. He has not started on Januvia yet because " prescription was not there" when he went to the pharmacy. He was accompanied by his wife Patient reports very thirsty and drinks a lot of fluid.  Frequent urination as well. He has lost 11 pounds since last visit.  Appetite is not good. Occasionally he has epigastric pain radiating to  the back.  He takes Tylenol with some relief.  . Review of Systems  Constitutional: Positive for appetite change and fatigue.  Negative for chills and fever.  HENT:   Negative for hearing loss and voice change.   Eyes: Negative for eye problems and icterus.  Respiratory: Negative for chest tightness, cough and shortness of breath.   Cardiovascular: Negative for chest pain and leg swelling.  Gastrointestinal: Negative for abdominal distention and abdominal pain.  Endocrine: Negative for hot flashes.  Genitourinary: Positive for frequency. Negative for difficulty urinating and dysuria.   Musculoskeletal: Negative for arthralgias.  Skin: Negative for itching and rash.  Neurological: Positive for numbness. Negative for light-headedness.  Hematological: Negative for adenopathy. Does not bruise/bleed easily.  Psychiatric/Behavioral: Negative for confusion.    MEDICAL HISTORY:  Past Medical History:  Diagnosis Date  . Allergic rhinitis   . Cancer [facility])    new dx Pancreatic Cancer - Aug 2020  . CHF (congestive heart failure) ([place])   . Chronic cholecystitis   . Coronary artery disease   . DDD (degenerative disc disease), cervical   . Dehydration 06/21/2019  . Dyspnea   . Early cataract   . Erectile dysfunction   . GERD (gastroesophageal reflux disease)   . Gouty arthritis   . Headache    occasional migraines  . Hypercalcemia   . Hyperlipemia   . Hypertension   . Palpitations   . Peripheral vascular disease ([place])   . 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   LIMA to LAD SVG to [place] SVG SEQUENTIALLY to OM1 and OM2 SVG to ACUTE MARGINAL  . Spinal stenosis of cervical region   . Vitamin D deficiency     SURGICAL HISTORY: Past Surgical History:  Procedure Laterality Date  . BACK SURGERY  2018    fusion with screws  . CHOLECYSTECTOMY N/A 11/13/2018   Procedure: LAPAROSCOPIC CHOLECYSTECTOMY;  Surgeon: Vickie Epley, MD;  Location: [facility] ORS;  Service: General;  Laterality: N/A;  . COLONOSCOPY    . CORONARY ARTERY BYPASS GRAFT N/A 09/30/2017   Procedure: CORONARY ARTERY BYPASS GRAFTING times five using right  and left Saphaneous vein harvested endoscopicly  and left internal mammary artery. (CABG),TEE;  Surgeon: Rexene Alberts, MD;  Location: [facility];  Service: Open Heart Surgery;  Laterality: N/A;  . LEFT HEART CATH AND CORONARY ANGIOGRAPHY Left 09/06/2017   Procedure: LEFT HEART CATH AND CORONARY ANGIOGRAPHY;  Surgeon: Corey Skains, MD;  Location: [place] [facility];  Service: Cardiovascular;  Laterality: Left;  . percutaneous transluminal balloon angioplasty  01/2010   of left lower extremity  . PORTA CATH INSERTION N/A 06/06/2019   Procedure: PORTA CATH INSERTION;  Surgeon: Katha Cabal, MD;  Location: [place] [facility];  Service: Cardiovascular;  Laterality: N/A;  . TEE WITHOUT CARDIOVERSION N/A 09/30/2017   Procedure: TRANSESOPHAGEAL ECHOCARDIOGRAM (TEE);  Surgeon: Rexene Alberts, MD;  Location: [place];  Service: Open Heart Surgery;  Laterality: N/A;  . VASCULAR SURGERY Left 2010   left exernal iliac and superficial femoral artery PTCA and stenting    SOCIAL HISTORY: Social History   Socioeconomic History  . Marital status: Married    Spouse name: Enid Derry  . Number of children: 2  . Years of education: Not on file  . Highest education level: Not on file  Occupational History  . Occupation: drove tractors    Comment: retired  Tobacco Use  . Smoking status: Former Smoker    Packs/day: 0.75  Years: 50.00    Pack years: 37.50    Types: Cigarettes    Quit date: 09/27/2017    Years since quitting: 2.3  . Smokeless tobacco: Former Systems developer    Types: Chew  Substance and Sexual Activity  . Alcohol use: Not Currently    Comment: stopped drinking before cabg  . Drug use: Not Currently    Types: Marijuana    Comment: stopped smoking before CABG  . Sexual activity: Not Currently  Other Topics Concern  . Not on file  Social History Narrative  . Not on file   Social Determinants of Health   Financial Resource Strain: Low Risk   . Difficulty of Paying Living Expenses:  Not hard at all  Food Insecurity: No Food Insecurity  . Worried About Charity fundraiser in the Last Year: Never true  . Ran Out of Food in the Last Year: Never true  Transportation Needs: No Transportation Needs  . Lack of Transportation (Medical): No  . Lack of Transportation (Non-Medical): No  Physical Activity: Sufficiently Active  . Days of Exercise per Week: 5 days  . Minutes of Exercise per Session: 150+ min  Stress: No Stress Concern Present  . Feeling of Stress : Not at all  Social Connections: Somewhat Isolated  . Frequency of Communication with Friends and Family: More than three times a week  . Frequency of Social Gatherings with Friends and Family: More than three times a week  . Attends Religious Services: Never  . Active Member of Clubs or Organizations: No  . Attends Archivist Meetings: Never  . Marital Status: Married  Human resources officer Violence:   . Fear of Current or Ex-Partner:   . Emotionally Abused:   [name] Kitchen Physically Abused:   . Sexually Abused:     FAMILY HISTORY: Family History  Problem Relation Age of Onset  . Brain cancer Mother   . Emphysema Father   . Cancer Brother     ALLERGIES:  is allergic to lipitor [atorvastatin]; zetia [ezetimibe]; lisinopril; protonix [pantoprazole]; and spironolactone.  MEDICATIONS:  No current facility-administered medications for this visit.   Current Outpatient Medications  Medication Sig Dispense Refill  . aspirin EC 81 MG tablet Take 81 mg by mouth daily.    . carvedilol (COREG) 6.25 MG tablet Take 6.25 mg by mouth 2 (two) times daily with a meal.  3  . diltiazem (CARDIZEM CD) 300 MG 24 hr capsule TAKE 1 CAPSULE BY MOUTH EVERY DAY 90 capsule 1  . dorzolamide-timolol (COSOPT) 22.3-6.8 MG/ML ophthalmic solution INSTILL ONE DROP INTO BOTH EYES TWICE DAILY    . ferrous sulfate 325 (65 FE) MG tablet TAKE 1 TABLET BY MOUTH EVERY DAY WITH BREAKFAST 90 tablet 1  . gabapentin (NEURONTIN) 300 MG capsule TAKE 2  CAPSULES (600 MG TOTAL) BY MOUTH 3 (THREE) TIMES DAILY. 540 capsule 0  . latanoprost (XALATAN) 0.005 % ophthalmic solution Place 1 drop into both eyes at bedtime.   3  . lidocaine-prilocaine (EMLA) cream APPLY TO AFFECTED AREA ONCE AS DIRECTED    . Multiple Vitamin (MULTIVITAMIN WITH MINERALS) TABS tablet Take 1 tablet by mouth daily.    . OMEGA-3 FATTY ACIDS PO Take 1,200 mg by mouth daily.     [name] Kitchen omeprazole (PRILOSEC) 40 MG capsule TAKE 1 CAPSULE BY MOUTH EVERY DAY 90 capsule 1  . ibuprofen (ADVIL) 800 MG tablet Take 1 tablet (800 mg total) by mouth every 8 (eight) hours as needed. 30 tablet 0  . loperamide (  IMODIUM A-D) 2 MG tablet Take 1 tablet (2 mg total) by mouth See admin instructions. Take 2 at diarrhea onset , then 1 every 2hr until 12hrs with no BM. May take 2 every 4hrs at night. If diarrhea recurs repeat. 100 tablet 1  . prochlorperazine (COMPAZINE) 10 MG tablet Take 1 tablet (10 mg total) by mouth every 6 (six) hours as needed (NAUSEA). (Patient not taking: Reported on 01/15/2020) 30 tablet 1  . sitaGLIPtin (JANUVIA) 100 MG tablet Take 1 tablet (100 mg total) by mouth daily. (Patient not taking: Reported on 01/15/2020) 90 tablet 1   Facility-Administered Medications Ordered in Other Visits  Medication Dose Route Frequency Provider Last Rate Last Admin  . insulin aspart (novoLOG) injection 5 Units  5 Units Subcutaneous Once Lilia Pro., MD      . sodium chloride flush (NS) 0.9 % injection 10 mL  10 mL Intracatheter PRN Earlie Server, MD      . sodium chloride flush (NS) 0.9 % injection 10 mL  10 mL Intravenous Once Earlie Server, MD         PHYSICAL EXAMINATION: ECOG PERFORMANCE STATUS: 1 - Symptomatic but completely ambulatory Vitals:   01/15/20 1340 01/15/20 1345  BP: 130/75 117/72  Pulse: 80   Resp: 16   Temp: 98.3 F (36.8 C)    Filed Weights   01/15/20 1340  Weight: 169 lb 14.4 oz (77.1 kg)    Physical Exam Constitutional:      General: He is not in acute distress. HENT:       Head: Normocephalic and atraumatic.  Eyes:     General: No scleral icterus. Cardiovascular:     Rate and Rhythm: Normal rate and regular rhythm.     Heart sounds: Normal heart sounds.  Pulmonary:     Effort: Pulmonary effort is normal. No respiratory distress.     Breath sounds: No wheezing.  Abdominal:     General: Bowel sounds are normal. There is no distension.     Palpations: Abdomen is soft.  Musculoskeletal:        General: No deformity. Normal range of motion.     Cervical back: Normal range of motion and neck supple.  Skin:    General: Skin is warm and dry.     Findings: No erythema or rash.  Neurological:     Mental Status: He is alert and oriented to person, place, and time. Mental status is at baseline.     Cranial Nerves: No cranial nerve deficit.     Coordination: Coordination normal.  Psychiatric:        Mood and Affect: Mood normal.     LABORATORY DATA:  I have reviewed the data as listed Lab Results  Component Value Date   WBC 8.2 01/15/2020   HGB 11.7 (L) 01/15/2020   HCT 36.5 (L) 01/15/2020   MCV 89.5 01/15/2020   PLT 235 01/15/2020   Recent Labs    12/31/19 0858 12/31/19 0858 01/07/20 0810 01/15/20 1326 01/15/20 1434  NA 135   < > 133* 129* 131*  K 4.2   < > 4.1 4.4 4.5  CL 104   < > 103 97* 98  CO2 23   < > '23 23 23  '$ GLUCOSE 265*   < > 403* 565* 539*  BUN 22   < > 20 29* 29*  CREATININE 0.98   < > 0.96 1.23 1.13  CALCIUM 9.3   < > 9.3 8.9 8.8*  GFRNONAA >60   < > >  60 58* >60  GFRAA >60   < > >60 >60 >60  PROT 7.3  --  7.6 7.2  --   ALBUMIN 3.7  --  3.6 3.5  --   AST 20  --  23 35  --   ALT 17  --  22 54*  --   ALKPHOS 76  --  87 86  --   BILITOT 0.5  --  0.6 0.8  --    < > = values in this interval not displayed.   Iron/TIBC/Ferritin/ %Sat    Component Value Date/Time   IRON 119 04/27/2019 0940   TIBC 363 04/27/2019 0940   FERRITIN 58 04/27/2019 0940   IRONPCTSAT 33 04/27/2019 0940      RADIOGRAPHIC STUDIES: I have  personally reviewed the radiological images as listed and agreed with the findings in the report. CT CHEST W CONTRAST  Result Date: 01/02/2020 CLINICAL DATA:  74 year old male with history of pancreatic cancer with liver metastasis. Elevated PSA. EXAM: CT CHEST, ABDOMEN, AND PELVIS WITH CONTRAST TECHNIQUE: Multidetector CT imaging of the chest, abdomen and pelvis was performed following the standard protocol during bolus administration of intravenous contrast. CONTRAST:  131m OMNIPAQUE IOHEXOL 300 MG/ML  SOLN COMPARISON:  CT of the chest abdomen pelvis dated 11/05/2019. FINDINGS: CT CHEST FINDINGS Cardiovascular: There is no cardiomegaly or pericardial effusion. Three-vessel coronary vascular calcification and postsurgical changes of CABG. There is advanced atherosclerotic calcification of the thoracic aorta. No aneurysmal dilatation or dissection. The origins of the great vessels of the aortic arch appear patent as visualized. The central pulmonary arteries appear patent for the degree of opacification. Mediastinum/Nodes: No hilar or mediastinal adenopathy. The esophagus is grossly unremarkable. Several subcentimeter hypodense thyroid nodules. No followup recommended (Ref: J Am Coll Radiol. 2015 Feb;12(2): 143-50). No mediastinal fluid collection. Lungs/Pleura: There is a 4.2 x 3.0 cm ovoid soft tissue mass involving the medial left lung base similar to prior CT. 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 Several small subpleural nodules primarily in the right lung. A 3 mm right upper lobe nodule (series 5, image 64) similar or minimally increased in size. There is a 5 mm subpleural nodule in the right lower lobe (series 5, image 99) new since the prior CT. Additional 5 mm right lung base subpleural nodule (series 5, image 14) also appears new since the prior CT. There is an 8 mm nodule at the left lung base (series 5, image 111) which has significantly increased in size  since the prior CT. A 4 mm left lower lobe nodule (series 5, image 103) is new since the prior CT. These nodules are concerning for metastatic disease. There is no focal consolidation, pleural effusion, pneumothorax. The central airways are patent. Musculoskeletal: Median sternotomy wires. Right pectoral Port-A-Cath with tip at the cavoatrial junction. There is osteopenia with degenerative changes of the spine. No acute osseous pathology. No definite suspicious osseous lesions. CT ABDOMEN PELVIS FINDINGS No intra-abdominal free air or free fluid. Hepatobiliary: There is a background of fatty infiltration of the liver. Interval increase in the size and number of hepatic hypodense lesions consistent with progression of metastatic disease. The largest lesion measures approximately 3.4 x 2.8 cm in the right lobe of the liver (previously 2.0 x 1.1 cm). No intrahepatic biliary ductal dilatation. Cholecystectomy. Pancreas: There is a 4.0 x 3.0 cm (previously 3.5 x 2.8 cm) soft tissue lesion in the region of the uncinate process of the pancreas (series  3, image 64). There is mild diffuse pancreatic atrophy and dilatation of the main pancreatic duct measuring up to 5 mm in diameter and progressed since the prior CT. There is mild haziness of the peripancreatic fat, likely reactive. Spleen: Normal in size without focal abnormality. Adrenals/Urinary Tract: The adrenal glands are unremarkable. There is no hydronephrosis on either side. Bilateral renal cysts measuring up to 4.3 cm in the inferior pole of the right kidney. There is symmetric enhancement and excretion of contrast by both kidneys. The visualized ureters and urinary bladder appear unremarkable. Stomach/Bowel: There is moderate stool throughout the colon. No bowel obstruction or active inflammation. The appendix is normal. Vascular/Lymphatic: Advanced aortoiliac atherosclerotic disease. The IVC is grossly unremarkable. Probable mild narrowing of the porta splenic  confluence secondary to pancreatic head mass. The SMV, splenic vein, and main portal vein are patent. No portal venous gas. Mildly enlarged pancreatico caval lymph node measures 15 mm (series 3, image 59). Reproductive: Mildly enlarged prostate gland. The seminal vesicles are symmetric. Other: None Musculoskeletal: Osteopenia with degenerative changes of the spine. L4-L5 posterior fusion and disc spacer. No acute osseous pathology. IMPRESSION: 1. Overall progression of hepatic and pulmonary metastatic disease since the prior CT of 11/05/2019. 2. Interval increase in the size of the pancreatic head mass and pancreatic duct dilatation. 3. A 15 mm peripancreatic lymph node, increased in size since the prior CT. 4. Ovoid soft tissue mass in the medial left lung base, similar to prior CT. 5. 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 6. No bowel obstruction. Normal appendix. 7. Aortic Atherosclerosis (ICD10-I70.0). Electronically Signed   By: Anner Crete M.D.   On: 01/02/2020 18:49   CT Chest W Contrast  Result Date: 11/05/2019 CLINICAL DATA:  Pancreatic cancer. Follow up for treatment response. EXAM: CT CHEST, ABDOMEN, AND PELVIS WITH CONTRAST TECHNIQUE: Multidetector CT imaging of the chest, abdomen and pelvis was performed following the standard protocol during bolus administration of intravenous contrast. CONTRAST:  171m OMNIPAQUE IOHEXOL 300 MG/ML  SOLN COMPARISON:  Prior CTs 05/16/2019 and 09/10/2019. PET-CT 06/04/2019. FINDINGS: CT CHEST FINDINGS Cardiovascular: Diffuse atherosclerosis of the aorta, great vessels and coronary arteries status post median sternotomy and CABG. No acute vascular findings are evident. Right IJ Port-A-Cath extends to the superior cavoatrial junction. Probable aortic valvular calcifications. The heart size is normal. There is no pericardial effusion. Mediastinum/Nodes: There are no enlarged mediastinal, hilar or axillary lymph nodes. Small mediastinal  lymph nodes are stable. There is stable minimal nodularity of the thyroid gland, consistent with a benign etiology. The trachea and esophagus appear normal. Lungs/Pleura: No pleural effusion or pneumothorax. Pleural based mass involving the left hemidiaphragm medially is grossly stable, measuring 4.2 x 2.8 cm on image 47/2. This was not hypermetabolic on PET-CT. Scattered tiny subpleural nodules are stable, likely benign. No new or enlarging pulmonary nodules. Musculoskeletal/Chest wall: No chest wall mass or suspicious osseous findings. There are stable degenerative changes within the thoracic spine and stable mild anterior wedging of the T5 vertebral body. Previous median sternotomy. CT ABDOMEN AND PELVIS FINDINGS Hepatobiliary: There are several ill-defined low-density hepatic lesions which are inapparent on the most recent study, suspicious for recurrent metastatic disease. Largest lesion posteriorly in the dome of the right hepatic lobe may reflect 2 adjacent lesions, measuring 2.1 x 1.2 cm on image 51/2. There is a smaller lesion more anteriorly in the right hepatic lobe measuring 1.1 cm on image 51/2. There is a 1.1 cm lesion inferiorly in the right lobe on  image 62/2. Previous cholecystectomy. No significant biliary dilatation. Pancreas: The previously demonstrated low-density mass projecting medially from the uncinate process has resolved. There is stable isodense prominence of the pancreatic head compared with the most recent study, measuring approximately 3.6 x 2.2 cm on image 66/2. No pancreatic ductal dilatation or surrounding inflammation. Spleen: Normal in size without focal abnormality. Adrenals/Urinary Tract: Both adrenal glands appear normal. There are bilateral renal cysts, largest in the lower pole of the right kidney, measuring 4.0 cm. In the lower pole of the left kidney, there is in 9 mm lesion (image 32/7) which is slightly denser than on the previous study, but unchanged in size, probably a  complex cyst. No evidence of urinary tract calculus or hydronephrosis. The bladder appears unremarkable for its degree of distention. Stomach/Bowel: No evidence of bowel wall thickening, distention or surrounding inflammatory change. Chronic gastric wall thickening, similar to previous study. Vascular/Lymphatic: There are no enlarged abdominal or pelvic lymph nodes. Stable small lymph nodes in the porta hepatis. Aortic and branch vessel atherosclerosis. No acute vascular findings. Reproductive: Stable mild enlargement of the prostate gland. Other: Intact anterior abdominal wall. No ascites or peritoneal nodularity. Musculoskeletal: No acute or significant osseous findings. Previous L4-5 fusion. IMPRESSION: 1. Several ill-defined low-density hepatic lesions are inapparent on the most recent study, suspicious for recurrent metastatic disease. Consider further evaluation with abdominal MRI or PET-CT. 2. Grossly stable appearance of the pancreatic head without evidence of local recurrence. No recurrent adenopathy in the chest or abdomen. 3. Stable small pulmonary nodules bilaterally consistent with a benign etiology. 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 Electronically Signed   By: Richardean Sale M.D.   On: 11/05/2019 11:46   CT ABDOMEN PELVIS W CONTRAST  Result Date: 01/02/2020 CLINICAL DATA:  74 year old male with history of pancreatic cancer with liver metastasis. Elevated PSA. EXAM: CT CHEST, ABDOMEN, AND PELVIS WITH CONTRAST TECHNIQUE: Multidetector CT imaging of the chest, abdomen and pelvis was performed following the standard protocol during bolus administration of intravenous contrast. CONTRAST:  116m OMNIPAQUE IOHEXOL 300 MG/ML  SOLN COMPARISON:  CT of the chest abdomen pelvis dated 11/05/2019. FINDINGS: CT CHEST FINDINGS Cardiovascular: There is no cardiomegaly or pericardial effusion. Three-vessel coronary  vascular calcification and postsurgical changes of CABG. There is advanced atherosclerotic calcification of the thoracic aorta. No aneurysmal dilatation or dissection. The origins of the great vessels of the aortic arch appear patent as visualized. The central pulmonary arteries appear patent for the degree of opacification. Mediastinum/Nodes: No hilar or mediastinal adenopathy. The esophagus is grossly unremarkable. Several subcentimeter hypodense thyroid nodules. No followup recommended (Ref: J Am Coll Radiol. 2015 Feb;12(2): 143-50). No mediastinal fluid collection. Lungs/Pleura: There is a 4.2 x 3.0 cm ovoid soft tissue mass involving the medial left lung base similar to prior CT. 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 Several small subpleural nodules primarily in the right lung. A 3 mm right upper lobe nodule (series 5, image 64) similar or minimally increased in size. There is a 5 mm subpleural nodule in the right lower lobe (series 5, image 99) new since the prior CT. Additional 5 mm right lung base subpleural nodule (series 5, image 14) also appears new since the prior CT. There is an 8 mm nodule at the left lung base (series 5, image 111) which has significantly increased in size since the prior CT. A 4 mm left  lower lobe nodule (series 5, image 103) is new since the prior CT. These nodules are concerning for metastatic disease. There is no focal consolidation, pleural effusion, pneumothorax. The central airways are patent. Musculoskeletal: Median sternotomy wires. Right pectoral Port-A-Cath with tip at the cavoatrial junction. There is osteopenia with degenerative changes of the spine. No acute osseous pathology. No definite suspicious osseous lesions. CT ABDOMEN PELVIS FINDINGS No intra-abdominal free air or free fluid. Hepatobiliary: There is a background of fatty infiltration of the liver. Interval increase in the size and number of hepatic hypodense lesions  consistent with progression of metastatic disease. The largest lesion measures approximately 3.4 x 2.8 cm in the right lobe of the liver (previously 2.0 x 1.1 cm). No intrahepatic biliary ductal dilatation. Cholecystectomy. Pancreas: There is a 4.0 x 3.0 cm (previously 3.5 x 2.8 cm) soft tissue lesion in the region of the uncinate process of the pancreas (series 3, image 64). There is mild diffuse pancreatic atrophy and dilatation of the main pancreatic duct measuring up to 5 mm in diameter and progressed since the prior CT. There is mild haziness of the peripancreatic fat, likely reactive. Spleen: Normal in size without focal abnormality. Adrenals/Urinary Tract: The adrenal glands are unremarkable. There is no hydronephrosis on either side. Bilateral renal cysts measuring up to 4.3 cm in the inferior pole of the right kidney. There is symmetric enhancement and excretion of contrast by both kidneys. The visualized ureters and urinary bladder appear unremarkable. Stomach/Bowel: There is moderate stool throughout the colon. No bowel obstruction or active inflammation. The appendix is normal. Vascular/Lymphatic: Advanced aortoiliac atherosclerotic disease. The IVC is grossly unremarkable. Probable mild narrowing of the porta splenic confluence secondary to pancreatic head mass. The SMV, splenic vein, and main portal vein are patent. No portal venous gas. Mildly enlarged pancreatico caval lymph node measures 15 mm (series 3, image 59). Reproductive: Mildly enlarged prostate gland. The seminal vesicles are symmetric. Other: None Musculoskeletal: Osteopenia with degenerative changes of the spine. L4-L5 posterior fusion and disc spacer. No acute osseous pathology. IMPRESSION: 1. Overall progression of hepatic and pulmonary metastatic disease since the prior CT of 11/05/2019. 2. Interval increase in the size of the pancreatic head mass and pancreatic duct dilatation. 3. A 15 mm peripancreatic lymph node, increased in size  since the prior CT. 4. Ovoid soft tissue mass in the medial left lung base, similar to prior CT. 5. 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 6. No bowel obstruction. Normal appendix. 7. Aortic Atherosclerosis (ICD10-I70.0). Electronically Signed   By: Anner Crete M.D.   On: 01/02/2020 18:49   CT Abdomen Pelvis W Contrast  Result Date: 11/05/2019 CLINICAL DATA:  Pancreatic cancer. Follow up for treatment response. EXAM: CT CHEST, ABDOMEN, AND PELVIS WITH CONTRAST TECHNIQUE: Multidetector CT imaging of the chest, abdomen and pelvis was performed following the standard protocol during bolus administration of intravenous contrast. CONTRAST:  141m OMNIPAQUE IOHEXOL 300 MG/ML  SOLN COMPARISON:  Prior CTs 05/16/2019 and 09/10/2019. PET-CT 06/04/2019. FINDINGS: CT CHEST FINDINGS Cardiovascular: Diffuse atherosclerosis of the aorta, great vessels and coronary arteries status post median sternotomy and CABG. No acute vascular findings are evident. Right IJ Port-A-Cath extends to the superior cavoatrial junction. Probable aortic valvular calcifications. The heart size is normal. There is no pericardial effusion. Mediastinum/Nodes: There are no enlarged mediastinal, hilar or axillary lymph nodes. Small mediastinal lymph nodes are stable. There is stable minimal nodularity of the thyroid gland, consistent with a benign etiology. The trachea and esophagus appear  normal. Lungs/Pleura: No pleural effusion or pneumothorax. Pleural based mass involving the left hemidiaphragm medially is grossly stable, measuring 4.2 x 2.8 cm on image 47/2. This was not hypermetabolic on PET-CT. Scattered tiny subpleural nodules are stable, likely benign. No new or enlarging pulmonary nodules. Musculoskeletal/Chest wall: No chest wall mass or suspicious osseous findings. There are stable degenerative changes within the thoracic spine and stable mild anterior wedging of the T5 vertebral body. Previous median  sternotomy. CT ABDOMEN AND PELVIS FINDINGS Hepatobiliary: There are several ill-defined low-density hepatic lesions which are inapparent on the most recent study, suspicious for recurrent metastatic disease. Largest lesion posteriorly in the dome of the right hepatic lobe may reflect 2 adjacent lesions, measuring 2.1 x 1.2 cm on image 51/2. There is a smaller lesion more anteriorly in the right hepatic lobe measuring 1.1 cm on image 51/2. There is a 1.1 cm lesion inferiorly in the right lobe on image 62/2. Previous cholecystectomy. No significant biliary dilatation. Pancreas: The previously demonstrated low-density mass projecting medially from the uncinate process has resolved. There is stable isodense prominence of the pancreatic head compared with the most recent study, measuring approximately 3.6 x 2.2 cm on image 66/2. No pancreatic ductal dilatation or surrounding inflammation. Spleen: Normal in size without focal abnormality. Adrenals/Urinary Tract: Both adrenal glands appear normal. There are bilateral renal cysts, largest in the lower pole of the right kidney, measuring 4.0 cm. In the lower pole of the left kidney, there is in 9 mm lesion (image 32/7) which is slightly denser than on the previous study, but unchanged in size, probably a complex cyst. No evidence of urinary tract calculus or hydronephrosis. The bladder appears unremarkable for its degree of distention. Stomach/Bowel: No evidence of bowel wall thickening, distention or surrounding inflammatory change. Chronic gastric wall thickening, similar to previous study. Vascular/Lymphatic: There are no enlarged abdominal or pelvic lymph nodes. Stable small lymph nodes in the porta hepatis. Aortic and branch vessel atherosclerosis. No acute vascular findings. Reproductive: Stable mild enlargement of the prostate gland. Other: Intact anterior abdominal wall. No ascites or peritoneal nodularity. Musculoskeletal: No acute or significant osseous findings.  Previous L4-5 fusion. IMPRESSION: 1. Several ill-defined low-density hepatic lesions are inapparent on the most recent study, suspicious for recurrent metastatic disease. Consider further evaluation with abdominal MRI or PET-CT. 2. Grossly stable appearance of the pancreatic head without evidence of local recurrence. No recurrent adenopathy in the chest or abdomen. 3. Stable small pulmonary nodules bilaterally consistent with a benign etiology. 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 Electronically Signed   By: Richardean Sale M.D.   On: 11/05/2019 11:46      ASSESSMENT & PLAN:  1. Pancreatic cancer metastasized to liver ([place])   2. Hyperglycemia   3. Liver metastasis ([place])   4. Epigastric pain     #Metastatic pancreatic cancer- liver mets, hilar nodal metastasis, ?bone mets, CA 19-9 has trended up to 22566 at last visit.  CT was independently viewed by me and discussed with patient. Status post cycle one 5-FU and liposomal irinotecan-dose reduced. He overall tolerates well.  #Uncontrolled diabetes/hyperglycemia.  Was recommended by primary care provider to start Januvia and he has not started yet. Blood glucose was elevated at 565. Recommend patient to go to emergency room for evaluation of hyperglycemia and insulin treatments. Hyperglycemia/new onset of diabetes may be secondary to pancreatic cancer #Hyponatremia #Normocytic anemia, likely secondary to chemo.  Monitor. #Epigastric  pain, likely neoplasm related. Patient prescription of oxycodone 5 mg every 6 hours as needed. All questions were answered. The patient knows to call the clinic with any problems questions or concerns. Follow-up TBD  Earlie Server, MD, PhD Hematology Oncology [place] at [facility] 01/15/2020

## 2020-01-15 NOTE — Progress Notes (Signed)
Patient reports no appetite and he has lost 11.1 pounds since last documented wt on 3/30.  Also having occasional dizziness with fatigue/weakness.  Does have occasional mid abdominal pain that is 4/10 today.

## 2020-01-15 NOTE — ED Triage Notes (Signed)
Pt arrives to ED with CBG of 565. No hx of DM. Pt has hx pancreatic CA, was there today and sent to ER d/t CBG. A&O, ambulatory.

## 2020-01-15 NOTE — ED Notes (Signed)
Patient denies any complaints of pain or weakness above baseline. Patient states he was sent by oncologist for high blood sugar. Patient states they had a conversation last week about starting a PO medication for glucose control, however denies starting any new medications. Patient states he has never had diabetes and is hesitant to have to stick his finger daily or take insulin shots.

## 2020-01-15 NOTE — ED Provider Notes (Signed)
[facility] Emergency Department Provider Note  ____________________________________________   First MD Initiated Contact with Patient 01/15/20 1615     (approximate)  I have reviewed the triage vital signs and the nursing notes.  History  Chief Complaint Hyperglycemia    HPI Craig [name] is a 74 y.o. male with a history of pancreatic cancer who presents from the clinic for asymptomatic hyperglycemia. Patient recently developed issues with elevated glucose over the last week or two.  He was seen by his PCP for this on 3/30, and after discussion they decided to start Januvia 100 mg daily.  Patient states he has not actually started on this yet due to waiting for approval by his insurance.  Today he had an oncology clinic visit with Dr. Tasia Catchings where he was noted to be hyperglycemic into the 500s and therefore referred to the ER for evaluation and treatment.  He denies any recent illnesses or infection.  No chest pain, cough, difficulty breathing.  He has had some slight increased thirst and polyuria, but denies any dysuria.  No vomiting or diarrhea.  No sick contacts.   Past Medical Hx Past Medical History:  Diagnosis Date  . Allergic rhinitis   . Cancer [facility])    new dx Pancreatic Cancer - Aug 2020  . CHF (congestive heart failure) ([place])   . Chronic cholecystitis   . Coronary artery disease   . DDD (degenerative disc disease), cervical   . Dehydration 06/21/2019  . Dyspnea   . Early cataract   . Erectile dysfunction   . GERD (gastroesophageal reflux disease)   . Gouty arthritis   . Headache    occasional migraines  . Hypercalcemia   . Hyperlipemia   . Hypertension   . Palpitations   . Peripheral vascular disease ([place])   . 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 Spinal stenosis of cervical region   . Vitamin D deficiency     Problem List Patient Active Problem List   Diagnosis Date Noted  . Fatigue 10/03/2019  . Low-level of literacy 06/29/2019  . Abnormal LFTs 06/29/2019  . AKI (acute kidney injury) ([place]) 06/29/2019  . Diarrhea 06/29/2019  . Orthostatic hypotension 06/29/2019  . Hyperglycemia 06/29/2019  . Anemia due to antineoplastic chemotherapy 06/29/2019  . Dehydration 06/21/2019  . Encounter for antineoplastic chemotherapy 06/21/2019  . Normocytic anemia 06/21/2019  . Port-A-Cath in place 06/07/2019  . Goals of care, counseling/discussion 06/01/2019  . Pancreatic cancer metastasized to liver ([place]) 06/01/2019  . Liver metastasis ([place]) 06/01/2019  . CAD (coronary artery disease) 05/17/2019  . Personal history of tobacco use, presenting hazards to health 05/10/2019  . Peripheral arterial disease ([place]) 05/09/2019  . Prediabetes 05/01/2019  . LVH (left ventricular hypertrophy) due to hypertensive disease, without heart failure 01/04/2018  . 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 Postoperative anemia   . Coronary artery disease involving native coronary artery of native heart   . Spondylolisthesis, lumbar region 03/16/2017  . Hyperlipemia, mixed 09/10/2015  . 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 Essential (primary) hypertension 09/10/2015  . Degeneration of cervical intervertebral disc 09/10/2015  . Allergic rhinitis 09/10/2015  . GERD (gastroesophageal reflux disease) 06/25/2015  . Cervical stenosis of spinal canal 06/25/2015  . Atherosclerosis of native arteries of extremity with intermittent claudication ([place]) 06/25/2015    Past Surgical Hx Past Surgical History:  Procedure Laterality Date  .  BACK SURGERY  2018    fusion with screws  . CHOLECYSTECTOMY N/A 11/13/2018   Procedure: LAPAROSCOPIC CHOLECYSTECTOMY;  Surgeon: Vickie Epley, MD;  Location: [facility] ORS;  Service: General;  Laterality: N/A;  . COLONOSCOPY    . CORONARY ARTERY BYPASS GRAFT N/A 09/30/2017   Procedure: CORONARY ARTERY BYPASS GRAFTING times five using right and left  Saphaneous vein harvested endoscopicly  and left internal mammary artery. (CABG),TEE;  Surgeon: Rexene Alberts, MD;  Location: [place];  Service: Open Heart Surgery;  Laterality: N/A;  . LEFT HEART CATH AND CORONARY ANGIOGRAPHY Left 09/06/2017   Procedure: LEFT HEART CATH AND CORONARY ANGIOGRAPHY;  Surgeon: Corey Skains, MD;  Location: [place] [facility];  Service: Cardiovascular;  Laterality: Left;  . percutaneous transluminal balloon angioplasty  01/2010   of left lower extremity  . PORTA CATH INSERTION N/A 06/06/2019   Procedure: PORTA CATH INSERTION;  Surgeon: Katha Cabal, MD;  Location: [place] [facility];  Service: Cardiovascular;  Laterality: N/A;  . TEE WITHOUT CARDIOVERSION N/A 09/30/2017   Procedure: TRANSESOPHAGEAL ECHOCARDIOGRAM (TEE);  Surgeon: Rexene Alberts, MD;  Location: [place];  Service: Open Heart Surgery;  Laterality: N/A;  . VASCULAR SURGERY Left 2010   left exernal iliac and superficial femoral artery PTCA and stenting    Medications Prior to Admission medications   Medication Sig Start Date End Date Taking? Authorizing Provider  aspirin EC 81 MG tablet Take 81 mg by mouth daily.   Yes [provider]  carvedilol (COREG) 6.25 MG tablet Take 6.25 mg by mouth 2 (two) times daily with a meal. 03/01/18  Yes [provider]  diltiazem (CARDIZEM CD) 300 MG 24 hr capsule TAKE 1 CAPSULE BY MOUTH EVERY DAY 10/20/19  Yes Pollak, Adriana M, PA-C  dorzolamide-timolol (COSOPT) 22.3-6.8 MG/ML ophthalmic solution INSTILL ONE DROP INTO BOTH EYES TWICE DAILY 11/08/18  Yes [provider]  ferrous sulfate 325 (65 FE) MG tablet TAKE 1 TABLET BY MOUTH EVERY DAY WITH BREAKFAST 12/09/19  Yes Pollak, Adriana M, PA-C  gabapentin (NEURONTIN) 300 MG capsule TAKE 2 CAPSULES (600 MG TOTAL) BY MOUTH 3 (THREE) TIMES DAILY. 01/14/20  Yes Carles Collet M, PA-C  ibuprofen (ADVIL) 800 MG tablet Take 1 tablet (800 mg total) by mouth every 8 (eight) hours as needed.  10/15/19  Yes Earlie Server, MD  latanoprost (XALATAN) 0.005 % ophthalmic solution Place 1 drop into both eyes at bedtime.  07/15/18  Yes [provider]  lidocaine-prilocaine (EMLA) cream APPLY TO AFFECTED AREA ONCE AS DIRECTED 08/24/19  Yes [provider]  loperamide (IMODIUM A-D) 2 MG tablet Take 1 tablet (2 mg total) by mouth See admin instructions. Take 2 at diarrhea onset , then 1 every 2hr until 12hrs with no BM. May take 2 every 4hrs at night. If diarrhea recurs repeat. 01/02/20  Yes Earlie Server, MD  Multiple Vitamin (MULTIVITAMIN WITH MINERALS) TABS tablet Take 1 tablet by mouth daily.   Yes [provider]  OMEGA-3 FATTY ACIDS PO Take 1,200 mg by mouth daily.    Yes [provider]  omeprazole (PRILOSEC) 40 MG capsule TAKE 1 CAPSULE BY MOUTH EVERY DAY 12/01/19  Yes Carles Collet M, PA-C  prochlorperazine (COMPAZINE) 10 MG tablet Take 1 tablet (10 mg total) by mouth every 6 (six) hours as needed (NAUSEA). Patient not taking: Reported on 01/15/2020 01/02/20   Earlie Server, MD  sitaGLIPtin (JANUVIA) 100 MG tablet Take 1 tablet (100 mg total) by mouth daily. Patient  not taking: Reported on 01/15/2020 01/09/20   Trinna Post, PA-C    Allergies Lipitor [atorvastatin], Zetia [ezetimibe], Lisinopril, Protonix [pantoprazole], and Spironolactone  Family Hx Family History  Problem Relation Age of Onset  . Brain cancer Mother   . Emphysema Father   . Cancer Brother     Social Hx Social History   Tobacco Use  . Smoking status: Former Smoker    Packs/day: 0.75    Years: 50.00    Pack years: 37.50    Types: Cigarettes    Quit date: 09/27/2017    Years since quitting: 2.3  . Smokeless tobacco: Former Systems developer    Types: Chew  Substance Use Topics  . Alcohol use: Not Currently    Comment: stopped drinking before cabg  . Drug use: Not Currently    Types: Marijuana    Comment: stopped smoking before CABG     Review of Systems  Constitutional: Negative for fever.  Negative for chills. Positive for hyperglycemia.  Eyes: Negative for visual changes. ENT: Negative for sore throat. Cardiovascular: Negative for chest pain. Respiratory: Negative for shortness of breath. Gastrointestinal: Negative for nausea. Negative for vomiting.  Genitourinary: Negative for dysuria. Positive for polyuria.  Musculoskeletal: Negative for leg swelling. Skin: Negative for rash. Neurological: Negative for headaches.   Physical Exam  Vital Signs: ED Triage Vitals [01/15/20 1426]  Enc Vitals Group     BP 122/74     Pulse Rate 79     Resp 18     Temp 98 F (36.7 C)     Temp Source Oral     SpO2 99 %     Weight 167 lb (75.8 kg)     Height 5\' 9"  (1.753 m)     Head Circumference      Peak Flow      Pain Score 4     Pain Loc      Pain Edu?      Excl. in [place]?     Constitutional: Alert and oriented. Well appearing. NAD.  Head: Normocephalic. Atraumatic. Eyes: Conjunctivae clear. Sclera anicteric. Pupils equal and symmetric. Nose: No masses or lesions. No congestion or rhinorrhea. Mouth/Throat: Wearing mask.  Neck: No stridor. Trachea midline.  Cardiovascular: Normal rate, regular rhythm. Extremities well perfused. Port in R upper chest.  Respiratory: Normal respiratory effort.  Lungs CTAB. Gastrointestinal: Soft. Non-distended. Non-tender.  Genitourinary: Deferred. Musculoskeletal: No lower extremity edema. No deformities. Neurologic:  Normal speech and language. No gross focal or lateralizing neurologic deficits are appreciated.  Skin: Skin is warm, dry and intact. No rash noted. Psychiatric: Mood and affect are appropriate for situation.  EKG  N/A    Radiology  N/A   Procedures  Procedure(s) performed (including critical care):  Procedures   Initial Impression / Assessment and Plan / MDM / ED Course  74 y.o. male who presents to the ED for asymptomatic hyperglycemia.  Ddx: hyperglycemia, UTI. No evidence by history or exam for inciting  ischemic or infectious trigger.  Will plan for labs, urine studies, fluids, CBG.   Clinical Course as of Jan 14 2258  Tue Jan 15, 2020  1840 Glucose improved to 298 after fluids and dose of insulin. Normal AG, no ketones in urine. No UTI. Feel patient is stable for d/c with outpatient follow up. Advised he call his pharmacy and PCP first thing tomorrow AM to clarify the availability of the new medication he is supposed to be initiated on for his diabetes.  He and the  wife at bedside voiced understanding of this.  Given return precautions.   [SM]    Clinical Course User Index [SM] Lilia Pro., MD     _______________________________   As part of my medical decision making I have reviewed available labs, radiology tests, reviewed old records/chart review, obtained additional history from family.    Final Clinical Impression(s) / ED Diagnosis  Final diagnoses:  Hyperglycemia       Note:  This document was prepared using Dragon voice recognition software and may include unintentional dictation errors.   Lilia Pro., MD 01/15/20 2259

## 2020-01-16 LAB — CANCER ANTIGEN 19-9: CA 19-9: 60741 U/mL — ABNORMAL HIGH (ref 0–35)

## 2020-01-17 ENCOUNTER — Other Ambulatory Visit: Payer: Self-pay

## 2020-01-17 ENCOUNTER — Ambulatory Visit (INDEPENDENT_AMBULATORY_CARE_PROVIDER_SITE_OTHER): Payer: Medicare Other | Admitting: Physician Assistant

## 2020-01-17 ENCOUNTER — Encounter: Payer: Self-pay | Admitting: Physician Assistant

## 2020-01-17 ENCOUNTER — Telehealth: Payer: Self-pay | Admitting: Physician Assistant

## 2020-01-17 VITALS — BP 118/64 | HR 78 | Temp 97.1°F | Wt 174.2 lb

## 2020-01-17 DIAGNOSIS — R739 Hyperglycemia, unspecified: Secondary | ICD-10-CM | POA: Diagnosis not present

## 2020-01-17 DIAGNOSIS — E1165 Type 2 diabetes mellitus with hyperglycemia: Secondary | ICD-10-CM

## 2020-01-17 NOTE — Progress Notes (Signed)
Patient: Craig [name] Male    DOB: 02/18/1946   74 y.o.   MRN: [id number] Visit Date: 01/17/2020  Today's Provider: Trinna Post, PA-C   Chief Complaint  Patient presents with  . Follow-up   Subjective:    I, Porsha McClurkin,CMA am acting as a Education administrator for [employer].  Follow Up ER Visit  Patient is here for ER follow up.  He was recently seen at [facility] for hyperglycemia on 01/15/2020. Treatment for this included labs and IV He reports good compliance with treatment. He reports this condition is Unchanged.  He was seen in this clinic for diabetes on 01/08/2020. He was started on Januvia 100 mg QD. Unfortunately, he was not able to pick this medication up until two days ago. On 01/15/2020, he attended his normal oncology visit where his sugars were noted to be in the 500's. He was sent to the ER where he received fluids and some insulin. He was discharged the same day.  Since then, he has taken two days of Januvia. He has noticed some polydipsia and urinary frequency. He is currently undergoing treatment for pancreatic cancer. His C-peptide from 01/08/2020 was elevated. He does not bel ------------------------------------------------------------------------------------            Allergies  Allergen Reactions  . Lipitor [Atorvastatin] Other (See Comments)    MYALGIA  . Zetia [Ezetimibe] Other (See Comments)    MYALGIA  . Lisinopril Cough  . Protonix [Pantoprazole] Other (See Comments)    headache  . Spironolactone Other (See Comments)    Breast tenderness     Current Outpatient Medications:  .  aspirin EC 81 MG tablet, Take 81 mg by mouth daily., Disp: , Rfl:  .  carvedilol (COREG) 6.25 MG tablet, Take 6.25 mg by mouth 2 (two) times daily with a meal., Disp: , Rfl: 3 .  diltiazem (CARDIZEM CD) 300 MG 24 hr capsule, TAKE 1 CAPSULE BY MOUTH EVERY DAY, Disp: 90 capsule, Rfl: 1 .  dorzolamide-timolol (COSOPT) 22.3-6.8 MG/ML ophthalmic solution, INSTILL  ONE DROP INTO BOTH EYES TWICE DAILY, Disp: , Rfl:  .  ferrous sulfate 325 (65 FE) MG tablet, TAKE 1 TABLET BY MOUTH EVERY DAY WITH BREAKFAST, Disp: 90 tablet, Rfl: 1 .  gabapentin (NEURONTIN) 300 MG capsule, TAKE 2 CAPSULES (600 MG TOTAL) BY MOUTH 3 (THREE) TIMES DAILY., Disp: 540 capsule, Rfl: 0 .  ibuprofen (ADVIL) 800 MG tablet, Take 1 tablet (800 mg total) by mouth every 8 (eight) hours as needed., Disp: 30 tablet, Rfl: 0 .  latanoprost (XALATAN) 0.005 % ophthalmic solution, Place 1 drop into both eyes at bedtime. , Disp: , Rfl: 3 .  lidocaine-prilocaine (EMLA) cream, APPLY TO AFFECTED AREA ONCE AS DIRECTED, Disp: , Rfl:  .  loperamide (IMODIUM A-D) 2 MG tablet, Take 1 tablet (2 mg total) by mouth See admin instructions. Take 2 at diarrhea onset , then 1 every 2hr until 12hrs with no BM. May take 2 every 4hrs at night. If diarrhea recurs repeat., Disp: 100 tablet, Rfl: 1 .  Multiple Vitamin (MULTIVITAMIN WITH MINERALS) TABS tablet, Take 1 tablet by mouth daily., Disp: , Rfl:  .  OMEGA-3 FATTY ACIDS PO, Take 1,200 mg by mouth daily. , Disp: , Rfl:  .  omeprazole (PRILOSEC) 40 MG capsule, TAKE 1 CAPSULE BY MOUTH EVERY DAY, Disp: 90 capsule, Rfl: 1 .  oxyCODONE (OXY IR/ROXICODONE) 5 MG immediate release tablet, Take 1 tablet (5 mg total) by mouth every 6 (six)  hours as needed for severe pain., Disp: 30 tablet, Rfl: 0 No current facility-administered medications for this visit.  Facility-Administered Medications Ordered in Other Visits:  .  sodium chloride flush (NS) 0.9 % injection 10 mL, 10 mL, Intracatheter, PRN, Earlie Server, MD .  sodium chloride flush (NS) 0.9 % injection 10 mL, 10 mL, Intravenous, Once, Earlie Server, MD  Review of Systems  Constitutional: Negative.   HENT: Negative.   Eyes: Negative.   Respiratory: Negative.   Cardiovascular: Negative.   Gastrointestinal: Negative.   Endocrine: Negative.   Genitourinary: Negative.   Musculoskeletal: Negative.   Skin: Negative.    Allergic/Immunologic: Negative.   Neurological: Negative.   Hematological: Negative.   Psychiatric/Behavioral: Negative.     Social History   Tobacco Use  . Smoking status: Former Smoker    Packs/day: 0.75    Years: 50.00    Pack years: 37.50    Types: Cigarettes    Quit date: 09/27/2017    Years since quitting: 2.3  . Smokeless tobacco: Former Systems developer    Types: Chew  Substance Use Topics  . Alcohol use: Not Currently    Comment: stopped drinking before cabg      Objective:   BP 118/64 (BP Location: Left Arm, Patient Position: Sitting, Cuff Size: Normal)   Pulse 78   Temp (!) 97.1 F (36.2 C) (Temporal)   Wt 174 lb 3.2 oz (79 kg)   SpO2 99%   BMI 25.72 kg/m  Vitals:   01/17/20 1129  BP: 118/64  Pulse: 78  Temp: (!) 97.1 F (36.2 C)  TempSrc: Temporal  SpO2: 99%  Weight: 174 lb 3.2 oz (79 kg)  Body mass index is 25.72 kg/m.   Physical Exam Constitutional:      Appearance: Normal appearance.  Cardiovascular:     Rate and Rhythm: Normal rate and regular rhythm.     Heart sounds: Normal heart sounds.  Pulmonary:     Effort: Pulmonary effort is normal.     Breath sounds: Normal breath sounds.  Skin:    General: Skin is warm and dry.  Neurological:     Mental Status: He is alert and oriented to person, place, and time. Mental status is at baseline.  Psychiatric:        Mood and Affect: Mood normal.        Behavior: Behavior normal.      No results found for any visits on 01/17/20.     Assessment & Plan    1. Hyperglycemia  Encouraged patient to continue Januvia, he has only taken it for two days. His spot glucose today was 204. He has follow up scheduled with [place] on 02/08/2020. He got his first COVID shot last Friday.   2. Uncontrolled type 2 diabetes mellitus with hyperglycemia ([place])  Hard script for glucometer given to patient. Refer to CCM for new diabetes education.   - Ambulatory referral to Chronic Care Management Services  The entirety of  the information documented in the History of Present Illness, Review of Systems and Physical Exam were personally obtained by me. Portions of this information were initially documented by [facility] and reviewed by me for thoroughness and accuracy.       Trinna Post, PA-C  [facility]

## 2020-01-18 ENCOUNTER — Telehealth: Payer: Self-pay | Admitting: Physician Assistant

## 2020-01-18 NOTE — Chronic Care Management (AMB) (Signed)
  Chronic Care Management   Note  01/18/2020 Name: Craig [name] MRN: [id number] DOB: 1946-09-16  Craig [name] is a 74 y.o. year old male who is a primary care patient of Trinna Post, [place]. I reached out to [name] by phone today in response to a referral sent by [name] PCP, Carles Collet PA-C     [name] was given information about Chronic Care Management services today including:  1. CCM service includes personalized support from designated clinical staff supervised by his physician, including individualized plan of care and coordination with other care providers 2. 24/7 contact phone numbers for assistance for urgent and routine care needs. 3. Service will only be billed when office clinical staff spend 20 minutes or more in a month to coordinate care. 4. Only one practitioner may furnish and bill the service in a calendar month. 5. The patient may stop CCM services at any time (effective at the end of the month) by phone call to the office staff. 6. The patient will be responsible for cost sharing (co-pay) of up to 20% of the service fee (after annual deductible is met).  Patient agreed to services and verbal consent obtained.   Follow up plan: Telephone appointment with care management team member scheduled for:01/22/2020  Glenna Durand, [place] Management ??[email or web address] ??[phone]

## 2020-01-20 ENCOUNTER — Other Ambulatory Visit: Payer: Self-pay

## 2020-01-20 ENCOUNTER — Emergency Department
Admission: EM | Admit: 2020-01-20 | Discharge: 2020-01-21 | Disposition: A | Discharge status: Home / Self Care | Payer: Medicare Other | Attending: Emergency Medicine | Admitting: Emergency Medicine

## 2020-01-20 DIAGNOSIS — Z87891 Personal history of nicotine dependence: Secondary | ICD-10-CM | POA: Diagnosis not present

## 2020-01-20 DIAGNOSIS — I509 Heart failure, unspecified: Secondary | ICD-10-CM | POA: Insufficient documentation

## 2020-01-20 DIAGNOSIS — Z951 Presence of aortocoronary bypass graft: Secondary | ICD-10-CM | POA: Diagnosis not present

## 2020-01-20 DIAGNOSIS — I110 Hypertensive heart disease with heart failure: Secondary | ICD-10-CM | POA: Insufficient documentation

## 2020-01-20 DIAGNOSIS — C787 Secondary malignant neoplasm of liver and intrahepatic bile duct: Secondary | ICD-10-CM | POA: Insufficient documentation

## 2020-01-20 DIAGNOSIS — E1165 Type 2 diabetes mellitus with hyperglycemia: Secondary | ICD-10-CM | POA: Insufficient documentation

## 2020-01-20 DIAGNOSIS — I2510 Atherosclerotic heart disease of native coronary artery without angina pectoris: Secondary | ICD-10-CM | POA: Diagnosis not present

## 2020-01-20 DIAGNOSIS — R739 Hyperglycemia, unspecified: Secondary | ICD-10-CM

## 2020-01-20 DIAGNOSIS — Z9049 Acquired absence of other specified parts of digestive tract: Secondary | ICD-10-CM | POA: Insufficient documentation

## 2020-01-20 DIAGNOSIS — E1110 Type 2 diabetes mellitus with ketoacidosis without coma: Secondary | ICD-10-CM | POA: Diagnosis not present

## 2020-01-20 DIAGNOSIS — C259 Malignant neoplasm of pancreas, unspecified: Secondary | ICD-10-CM | POA: Insufficient documentation

## 2020-01-20 DIAGNOSIS — Z7982 Long term (current) use of aspirin: Secondary | ICD-10-CM | POA: Diagnosis not present

## 2020-01-20 DIAGNOSIS — Z79899 Other long term (current) drug therapy: Secondary | ICD-10-CM | POA: Diagnosis not present

## 2020-01-20 HISTORY — DX: Type 2 diabetes mellitus without complications: E11.9

## 2020-01-20 LAB — URINALYSIS, COMPLETE (UACMP) WITH MICROSCOPIC
Bacteria, UA: NONE SEEN
Bilirubin Urine: NEGATIVE
Glucose, UA: >=500 mg/dL — AB
Hgb urine dipstick: NEGATIVE
Ketones, ur: NEGATIVE mg/dL
Leukocytes,Ua: NEGATIVE
Nitrite: NEGATIVE
Protein, ur: NEGATIVE mg/dL
Specific Gravity, Urine: 1.032 — ABNORMAL HIGH (ref 1.005–1.030)
Squamous Epithelial / HPF: NONE SEEN (ref 0–5)
pH: 6 (ref 5.0–8.0)

## 2020-01-20 LAB — BASIC METABOLIC PANEL
Anion gap: 9 (ref 5–15)
BUN: 23 mg/dL (ref 8–23)
CO2: 23 mmol/L (ref 22–32)
Calcium: 9.1 mg/dL (ref 8.9–10.3)
Chloride: 96 mmol/L — ABNORMAL LOW (ref 98–111)
Creatinine, Ser: 1.05 mg/dL (ref 0.61–1.24)
GFR calc Af Amer: >60 mL/min (ref >60)
GFR calc non Af Amer: >60 mL/min (ref >60)
Glucose, Bld: 651 mg/dL (ref 70–99)
Potassium: 4.6 mmol/L (ref 3.5–5.1)
Sodium: 128 mmol/L — ABNORMAL LOW (ref 135–145)

## 2020-01-20 LAB — CBC
HCT: 34.3 % — ABNORMAL LOW (ref 39.0–52.0)
Hemoglobin: 11 g/dL — ABNORMAL LOW (ref 13.0–17.0)
MCH: 28.8 pg (ref 26.0–34.0)
MCHC: 32.1 g/dL (ref 30.0–36.0)
MCV: 89.8 fL (ref 80.0–100.0)
Platelets: 181 10*3/uL (ref 150–400)
RBC: 3.82 MIL/uL — ABNORMAL LOW (ref 4.22–5.81)
RDW: 14.6 % (ref 11.5–15.5)
WBC: 8 10*3/uL (ref 4.0–10.5)
nRBC: 0 % (ref 0.0–0.2)

## 2020-01-20 LAB — GLUCOSE, CAPILLARY
Glucose-Capillary: 596 mg/dL (ref 70–99)
Glucose-Capillary: 515 mg/dL (ref 70–99)
Glucose-Capillary: 394 mg/dL — ABNORMAL HIGH (ref 70–99)
Glucose-Capillary: 274 mg/dL — ABNORMAL HIGH (ref 70–99)

## 2020-01-20 MED ORDER — INSULIN ASPART 100 UNIT/ML ~~LOC~~ SOLN
5.0000 [IU] | Freq: Once | SUBCUTANEOUS | Status: AC
  Administered 2020-01-20: 5 [IU] via INTRAVENOUS
  Filled 2020-01-20: qty 1

## 2020-01-20 MED ORDER — SODIUM CHLORIDE 0.9 % IV BOLUS
1000.0000 mL | Freq: Once | INTRAVENOUS | Status: AC
  Administered 2020-01-20: 1000 mL via INTRAVENOUS

## 2020-01-20 NOTE — ED Provider Notes (Signed)
[facility] Emergency Department Provider Note   ____________________________________________   First MD Initiated Contact with Patient 01/20/20 2124     (approximate)  I have reviewed the triage vital signs and the nursing notes.   HISTORY  Chief Complaint Hyperglycemia    HPI Craig [name] is a 74 y.o. male with past medical history of angry at a cancer who presents to the ED for hyperglycemia.  Patient states that he was recently diagnosed with diabetes a little over a week ago and since then was started on 100 mg of Januvia daily.  His blood glucoses have continued to be elevated and he reports general fatigue.  He states he has been compliant with the Januvia and has otherwise felt well with no fevers, cough, chest pain, shortness of breath.  He denies any abdominal pain, dysuria, vomiting, or diarrhea.  He is currently undergoing treatment for his pancreatic cancer and is scheduled for infusion tomorrow.  When he noted his blood glucose to be reading "high" at home tonight, he decided to be checked out in the ED.        Past Medical History:  Diagnosis Date  . Allergic rhinitis   . Cancer [facility])    new dx Pancreatic Cancer - Aug 2020  . CHF (congestive heart failure) ([place])   . Chronic cholecystitis   . Coronary artery disease   . DDD (degenerative disc disease), cervical   . Dehydration 06/21/2019  . Diabetes mellitus without complication ([place])   . Dyspnea   . Early cataract   . Erectile dysfunction   . GERD (gastroesophageal reflux disease)   . Gouty arthritis   . Headache    occasional migraines  . Hypercalcemia   . Hyperlipemia   . Hypertension   . Palpitations   . Peripheral vascular disease ([place])   . 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 Spinal stenosis of cervical region   . Vitamin D deficiency     Patient Active Problem List   Diagnosis Date  Noted  . Fatigue 10/03/2019  . Low-level of literacy 06/29/2019  . Abnormal LFTs 06/29/2019  . AKI (acute kidney injury) ([place]) 06/29/2019  . Diarrhea 06/29/2019  . Orthostatic hypotension 06/29/2019  . Hyperglycemia 06/29/2019  . Anemia due to antineoplastic chemotherapy 06/29/2019  . Dehydration 06/21/2019  . Encounter for antineoplastic chemotherapy 06/21/2019  . Normocytic anemia 06/21/2019  . Port-A-Cath in place 06/07/2019  . Goals of care, counseling/discussion 06/01/2019  . Pancreatic cancer metastasized to liver ([place]) 06/01/2019  . Liver metastasis ([place]) 06/01/2019  . CAD (coronary artery disease) 05/17/2019  . Personal history of tobacco use, presenting hazards to health 05/10/2019  . Peripheral arterial disease ([place]) 05/09/2019  . Prediabetes 05/01/2019  . LVH (left ventricular hypertrophy) due to hypertensive disease, without heart failure 01/04/2018  . 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 Postoperative anemia   . Coronary artery disease involving native coronary artery of native heart   . Spondylolisthesis, lumbar region 03/16/2017  . Hyperlipemia, mixed 09/10/2015  . 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 Essential (primary) hypertension 09/10/2015  . Degeneration of cervical intervertebral disc 09/10/2015  . Allergic rhinitis 09/10/2015  . GERD (gastroesophageal reflux disease) 06/25/2015  . Cervical stenosis of spinal canal 06/25/2015  . Atherosclerosis of native arteries of extremity with intermittent claudication ([place]) 06/25/2015    Past Surgical History:  Procedure Laterality Date  .  BACK SURGERY  2018    fusion with screws  . CHOLECYSTECTOMY N/A 11/13/2018   Procedure: LAPAROSCOPIC CHOLECYSTECTOMY;  Surgeon: Vickie Epley, MD;  Location: [facility] ORS;  Service: General;  Laterality: N/A;  . COLONOSCOPY    . CORONARY ARTERY BYPASS GRAFT N/A 09/30/2017   Procedure: CORONARY ARTERY BYPASS GRAFTING times five using right and left Saphaneous vein harvested endoscopicly   and left internal mammary artery. (CABG),TEE;  Surgeon: Rexene Alberts, MD;  Location: [place];  Service: Open Heart Surgery;  Laterality: N/A;  . LEFT HEART CATH AND CORONARY ANGIOGRAPHY Left 09/06/2017   Procedure: LEFT HEART CATH AND CORONARY ANGIOGRAPHY;  Surgeon: Corey Skains, MD;  Location: [place] CV [facility];  Service: Cardiovascular;  Laterality: Left;  . percutaneous transluminal balloon angioplasty  01/2010   of left lower extremity  . PORTA CATH INSERTION N/A 06/06/2019   Procedure: PORTA CATH INSERTION;  Surgeon: Katha Cabal, MD;  Location: [place] CV [facility];  Service: Cardiovascular;  Laterality: N/A;  . TEE WITHOUT CARDIOVERSION N/A 09/30/2017   Procedure: TRANSESOPHAGEAL ECHOCARDIOGRAM (TEE);  Surgeon: Rexene Alberts, MD;  Location: [place];  Service: Open Heart Surgery;  Laterality: N/A;  . VASCULAR SURGERY Left 2010   left exernal iliac and superficial femoral artery PTCA and stenting    Prior to Admission medications   Medication Sig Start Date End Date Taking? Authorizing Provider  aspirin EC 81 MG tablet Take 81 mg by mouth daily.    [provider]  carvedilol (COREG) 6.25 MG tablet Take 6.25 mg by mouth 2 (two) times daily with a meal. 03/01/18   [provider]  diltiazem (CARDIZEM CD) 300 MG 24 hr capsule TAKE 1 CAPSULE BY MOUTH EVERY DAY 10/20/19   Carles Collet M, PA-C  dorzolamide-timolol (COSOPT) 22.3-6.8 MG/ML ophthalmic solution INSTILL ONE DROP INTO BOTH EYES TWICE DAILY 11/08/18   [provider]  ferrous sulfate 325 (65 FE) MG tablet TAKE 1 TABLET BY MOUTH EVERY DAY WITH BREAKFAST 12/09/19   Carles Collet M, PA-C  gabapentin (NEURONTIN) 300 MG capsule TAKE 2 CAPSULES (600 MG TOTAL) BY MOUTH 3 (THREE) TIMES DAILY. 01/14/20   Trinna Post, PA-C  ibuprofen (ADVIL) 800 MG tablet Take 1 tablet (800 mg total) by mouth every 8 (eight) hours as needed. 10/15/19   Earlie Server, MD  latanoprost (XALATAN) 0.005 % ophthalmic solution  Place 1 drop into both eyes at bedtime.  07/15/18   [provider]  lidocaine-prilocaine (EMLA) cream APPLY TO AFFECTED AREA ONCE AS DIRECTED 08/24/19   [provider]  loperamide (IMODIUM A-D) 2 MG tablet Take 1 tablet (2 mg total) by mouth See admin instructions. Take 2 at diarrhea onset , then 1 every 2hr until 12hrs with no BM. May take 2 every 4hrs at night. If diarrhea recurs repeat. 01/02/20   Earlie Server, MD  Multiple Vitamin (MULTIVITAMIN WITH MINERALS) TABS tablet Take 1 tablet by mouth daily.    [provider]  OMEGA-3 FATTY ACIDS PO Take 1,200 mg by mouth daily.     [provider]  omeprazole (PRILOSEC) 40 MG capsule TAKE 1 CAPSULE BY MOUTH EVERY DAY 12/01/19   Trinna Post, PA-C  oxyCODONE (OXY IR/ROXICODONE) 5 MG immediate release tablet Take 1 tablet (5 mg total) by mouth every 6 (six) hours as needed for severe pain. 01/15/20   Earlie Server, MD    Allergies Lipitor [atorvastatin], Zetia [ezetimibe], Lisinopril, Protonix [pantoprazole], and Spironolactone  Family History  Problem  Relation Age of Onset  . Brain cancer Mother   . Emphysema Father   . Cancer Brother     Social History Social History   Tobacco Use  . Smoking status: Former Smoker    Packs/day: 0.75    Years: 50.00    Pack years: 37.50    Types: Cigarettes    Quit date: 09/27/2017    Years since quitting: 2.3  . Smokeless tobacco: Former Systems developer    Types: Chew  Substance Use Topics  . Alcohol use: Not Currently    Comment: stopped drinking before cabg  . Drug use: Not Currently    Types: Marijuana    Comment: stopped smoking before CABG    Review of Systems  Constitutional: No fever/chills.  Positive for fatigue. Eyes: No visual changes. ENT: No sore throat. Cardiovascular: Denies chest pain. Respiratory: Denies shortness of breath. Gastrointestinal: No abdominal pain.  No nausea, no vomiting.  No diarrhea.  No constipation. Genitourinary: Negative for  dysuria. Musculoskeletal: Negative for back pain. Skin: Negative for rash. Neurological: Negative for headaches, focal weakness or numbness.  ____________________________________________   PHYSICAL EXAM:  VITAL SIGNS: ED Triage Vitals  Enc Vitals Group     BP 01/20/20 1800 113/69     Pulse Rate 01/20/20 1800 86     Resp 01/20/20 1800 16     Temp 01/20/20 1800 98 F (36.7 C)     Temp Source 01/20/20 1800 Oral     SpO2 01/20/20 1800 100 %     Weight 01/20/20 1801 174 lb (78.9 kg)     Height 01/20/20 1801 5\' 9"  (1.753 m)     Head Circumference --      Peak Flow --      Pain Score 01/20/20 1801 0     Pain Loc --      Pain Edu? --      Excl. in [place]? --     Constitutional: Alert and oriented. Eyes: Conjunctivae are normal. Head: Atraumatic. Nose: No congestion/rhinnorhea. Mouth/Throat: Mucous membranes are moist. Neck: Normal ROM Cardiovascular: Normal rate, regular rhythm. Grossly normal heart sounds. Respiratory: Normal respiratory effort.  No retractions. Lungs CTAB. Gastrointestinal: Soft and nontender. No distention. Genitourinary: deferred Musculoskeletal: No lower extremity tenderness nor edema. Neurologic:  Normal speech and language. No gross focal neurologic deficits are appreciated. Skin:  Skin is warm, dry and intact. No rash noted. Psychiatric: Mood and affect are normal. Speech and behavior are normal.  ____________________________________________   LABS (all labs ordered are listed, but only abnormal results are displayed)  Labs Reviewed  GLUCOSE, CAPILLARY - Abnormal; Notable for the following components:      Result Value   Glucose-Capillary 596 (*)    All other components within normal limits  BASIC METABOLIC PANEL - Abnormal; Notable for the following components:   Sodium 128 (*)    Chloride 96 (*)    Glucose, Bld 651 (*)    All other components within normal limits  CBC - Abnormal; Notable for the following components:   RBC 3.82 (*)     Hemoglobin 11.0 (*)    HCT 34.3 (*)    All other components within normal limits  URINALYSIS, COMPLETE (UACMP) WITH MICROSCOPIC - Abnormal; Notable for the following components:   Color, Urine STRAW (*)    APPearance CLEAR (*)    Specific Gravity, Urine 1.032 (*)    Glucose, UA >=500 (*)    All other components within normal limits  GLUCOSE, CAPILLARY - Abnormal; Notable  for the following components:   Glucose-Capillary 515 (*)    All other components within normal limits  GLUCOSE, CAPILLARY - Abnormal; Notable for the following components:   Glucose-Capillary 394 (*)    All other components within normal limits  GLUCOSE, CAPILLARY - Abnormal; Notable for the following components:   Glucose-Capillary 274 (*)    All other components within normal limits  CBG MONITORING, ED  CBG MONITORING, ED    PROCEDURES  Procedure(s) performed (including Critical Care):  Procedures   ____________________________________________   INITIAL IMPRESSION / ASSESSMENT AND PLAN / ED COURSE       74 year old male with history of pancreatic cancer and new diagnosis of diabetes presents to the ED for hyperglycemia noted at home.  He is essentially asymptomatic with this, only complains of some mild fatigue but has not had any abdominal pain, vomiting, diarrhea, or fever.  Labs here in the ED are consistent with hyperglycemia but do not show any evidence of DKA.  He was hydrated with IV fluids and also given insulin with improvement in glucose levels.  He was counseled to continue Januvia and avoid sugar-containing foods.  He was counseled to speak with his PCP over the phone tomorrow morning regarding any changes to his diabetic regimen, patient agrees with plan.      ____________________________________________   FINAL CLINICAL IMPRESSION(S) / ED DIAGNOSES  Final diagnoses:  Hyperglycemia  Pancreatic cancer metastasized to liver [facility])     ED Discharge Orders    None       Note:  This  document was prepared using Dragon voice recognition software and may include unintentional dictation errors.   Blake Divine, MD 01/21/20 [phone]

## 2020-01-20 NOTE — ED Notes (Signed)
Pt states that he checked his blood sugar and that it kept climbing 3x in a row. Pt denies nvd or feeling any different. Pt A&Ox4, no acute distress noted.

## 2020-01-20 NOTE — ED Triage Notes (Signed)
Pt arrives to ED. New onset DM. CBG at home "High". A&O, in wheelchair. Started on januvia. Hx pancreatic CA.

## 2020-01-21 ENCOUNTER — Inpatient Hospital Stay (HOSPITAL_BASED_OUTPATIENT_CLINIC_OR_DEPARTMENT_OTHER): Payer: Medicare Other | Admitting: Oncology

## 2020-01-21 ENCOUNTER — Inpatient Hospital Stay: Payer: Medicare Other

## 2020-01-21 ENCOUNTER — Encounter: Payer: Self-pay | Admitting: Oncology

## 2020-01-21 VITALS — BP 131/73 | HR 91 | Temp 95.9°F | Resp 18 | Wt 170.9 lb

## 2020-01-21 DIAGNOSIS — E1110 Type 2 diabetes mellitus with ketoacidosis without coma: Secondary | ICD-10-CM | POA: Diagnosis not present

## 2020-01-21 DIAGNOSIS — C259 Malignant neoplasm of pancreas, unspecified: Secondary | ICD-10-CM | POA: Diagnosis not present

## 2020-01-21 DIAGNOSIS — C787 Secondary malignant neoplasm of liver and intrahepatic bile duct: Secondary | ICD-10-CM

## 2020-01-21 DIAGNOSIS — Z5111 Encounter for antineoplastic chemotherapy: Secondary | ICD-10-CM | POA: Diagnosis not present

## 2020-01-21 DIAGNOSIS — R739 Hyperglycemia, unspecified: Secondary | ICD-10-CM | POA: Diagnosis not present

## 2020-01-21 LAB — COMPREHENSIVE METABOLIC PANEL
ALT: 66 U/L — ABNORMAL HIGH (ref 0–44)
AST: 37 U/L (ref 15–41)
Albumin: 3.5 g/dL (ref 3.5–5.0)
Alkaline Phosphatase: 90 U/L (ref 38–126)
Anion gap: 9 (ref 5–15)
BUN: 19 mg/dL (ref 8–23)
CO2: 24 mmol/L (ref 22–32)
Calcium: 9.1 mg/dL (ref 8.9–10.3)
Chloride: 101 mmol/L (ref 98–111)
Creatinine, Ser: 0.86 mg/dL (ref 0.61–1.24)
GFR calc Af Amer: >60 mL/min (ref >60)
GFR calc non Af Amer: >60 mL/min (ref >60)
Glucose, Bld: 367 mg/dL — ABNORMAL HIGH (ref 70–99)
Potassium: 4.1 mmol/L (ref 3.5–5.1)
Sodium: 134 mmol/L — ABNORMAL LOW (ref 135–145)
Total Bilirubin: 1 mg/dL (ref 0.3–1.2)
Total Protein: 6.9 g/dL (ref 6.5–8.1)

## 2020-01-21 LAB — CBC WITH DIFFERENTIAL/PLATELET
Abs Immature Granulocytes: 0.06 10*3/uL (ref 0.00–0.07)
Basophils Absolute: 0.1 10*3/uL (ref 0.0–0.1)
Basophils Relative: 1 %
Eosinophils Absolute: 0.2 10*3/uL (ref 0.0–0.5)
Eosinophils Relative: 2 %
HCT: 34.6 % — ABNORMAL LOW (ref 39.0–52.0)
Hemoglobin: 11 g/dL — ABNORMAL LOW (ref 13.0–17.0)
Immature Granulocytes: 1 %
Lymphocytes Relative: 10 %
Lymphs Abs: 0.9 10*3/uL (ref 0.7–4.0)
MCH: 28.4 pg (ref 26.0–34.0)
MCHC: 31.8 g/dL (ref 30.0–36.0)
MCV: 89.2 fL (ref 80.0–100.0)
Monocytes Absolute: 0.7 10*3/uL (ref 0.1–1.0)
Monocytes Relative: 8 %
Neutro Abs: 6.7 10*3/uL (ref 1.7–7.7)
Neutrophils Relative %: 78 %
Platelets: 177 10*3/uL (ref 150–400)
RBC: 3.88 MIL/uL — ABNORMAL LOW (ref 4.22–5.81)
RDW: 14.7 % (ref 11.5–15.5)
WBC: 8.5 10*3/uL (ref 4.0–10.5)
nRBC: 0 % (ref 0.0–0.2)

## 2020-01-21 MED ORDER — SODIUM CHLORIDE 0.9 % IV SOLN
67.8000 mg/m2 | Freq: Once | INTRAVENOUS | Status: AC
  Administered 2020-01-21: 129 mg via INTRAVENOUS
  Filled 2020-01-21: qty 30

## 2020-01-21 MED ORDER — SODIUM CHLORIDE 0.9 % IV SOLN
Freq: Once | INTRAVENOUS | Status: AC
  Filled 2020-01-21: qty 250

## 2020-01-21 MED ORDER — SODIUM CHLORIDE 0.9 % IV SOLN
421.0000 mg/m2 | Freq: Once | INTRAVENOUS | Status: AC
  Administered 2020-01-21: 800 mg via INTRAVENOUS
  Filled 2020-01-21: qty 25

## 2020-01-21 MED ORDER — SODIUM CHLORIDE 0.9% FLUSH
10.0000 mL | INTRAVENOUS | Status: DC | PRN
  Administered 2020-01-21: 09:00:00 10 mL via INTRAVENOUS
  Filled 2020-01-21: qty 10

## 2020-01-21 MED ORDER — PALONOSETRON HCL INJECTION 0.25 MG/5ML
0.2500 mg | Freq: Once | INTRAVENOUS | Status: AC
  Administered 2020-01-21: 0.25 mg via INTRAVENOUS
  Filled 2020-01-21: qty 5

## 2020-01-21 MED ORDER — SODIUM CHLORIDE 0.9 % IV SOLN
2400.0000 mg/m2 | INTRAVENOUS | Status: DC
  Administered 2020-01-21: 4550 mg via INTRAVENOUS
  Filled 2020-01-21: qty 91

## 2020-01-21 MED ORDER — SODIUM CHLORIDE 0.9 % IV SOLN
10.0000 mg | Freq: Once | INTRAVENOUS | Status: AC
  Administered 2020-01-21: 10 mg via INTRAVENOUS
  Filled 2020-01-21: qty 10

## 2020-01-21 NOTE — Progress Notes (Signed)
Hematology/Oncology  Follow up note [facility] [phone]) [phone] [phone]   Patient Care Team: Paulene Floor as PCP - General (Physician Assistant) Clent Jacks, RN as Oncology Nurse Navigator Earlie Server, MD as Consulting Physician (Hematology and Oncology) Neldon Labella, RN as Registered Nurse  REFERRING PROVIDER: Trinna Post, PA-C  CHIEF COMPLAINTS/REASON FOR VISIT:  Follow up for treatment of pancreatic cancer  HISTORY OF PRESENTING ILLNESS:   Craig [name] is a  74 y.o.  male with PMH listed below, including CABG x5, PVD, hypertension, former tobacco abuse, former alcohol abuse, iron deficiency anemia, was seen in consultation at the request of  Terrilee Croak, Adriana M, PA-C  for evaluation of abnormal CT Patient recently presented to primary care provider complaining for abdominal pain radiating to his back for about 10 days.  Patient uses Tylenol as needed for pain. Work-up showed elevated amylase, lipase, mild anemia with hemoglobin of 11.3 Acute hepatitis panel negative. 05/16/2019 CT abdomen pelvis with contrast showed poorly marginated heterogeneous hypodense 2.9 cm pancreatic mass at the uncinate process, worrisome for primary pancreatic cancer.  No biliary or pancreatic duct dilation. Several ill defined hypodense liver masses scattered throughout the liver, largest 3.1 cm in the segment 6 right liver lobe. Nonspecific portacaval and aortocaval adenopathy. Extreme medial left lung base 4.4 cm pleural-based mass probably a pleural metastasis.  Additional tiny pulmonary nodules scattered at the right lung base are indeterminate. Mild prostate or megaly  Patient was referred to heme-onc for further evaluation. Patient reports that his abdominal pain was 10 out of 10 a few days ago, he uses Tylenol as needed. Today his pain is getting better, 2 out of 10.  Denies any nausea, vomiting, diarrhea, fever or chills. Married,  lives with wife.  Appetite is good.  He weighs 195 pounds today at the clinic. His weight was 204 pounds back in May 2020.  He had a history of 37.5-pack-year smoking history quitting 2018. No longer drinking alcohol.  # ultrasound-guided liver biopsy on 05/21/2019.  Pathology showed fragments of benign hepatic parenchyma showing minimal macro vascular steatosis, otherwise no significant histo pathologic change. Single core of renal tissue showing approximately 25% glomerulosclerosis  # #History of CAD, status post CABG x5.  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  # Omniseq test showed PD-L1 50% TPS, TMB high, negative for BRCA1/2 or NTRK fusion.  MSI could not be completed.  Patient declines genetic testing  INTERVAL HISTORY Craig [name] is a 74 y.o. male who has above history reviewed by me today presents for evaluation of chemotherapy tolerability for metastatic pancreatic cancer.   Problems and complaints are listed below: He received third line chemotherapy treatment 5-FU with liposomal irinotecan.He denies nausea vomiting. Continue to have patient was sent to ER at the last visit and patient.  Patient a week ago.  He measures his blood pressure at home still runs between 300 -600s.  Patient went to ER again on 01/20/2020 and was given IV fluid. Patient reports intermittent epigastric discomfort which he takes Tylenol as needed.  Was given oxycodone which he has not started yet.  Appetite is fair. Continue to have weight loss   . Review of Systems  Constitutional: Positive for appetite change and fatigue. Negative for chills, fever and unexpected weight change.  HENT:   Negative for hearing loss and voice change.   Eyes: Negative for  eye problems and icterus.  Respiratory: Negative for chest tightness, cough and shortness of breath.   Cardiovascular:  Negative for chest pain and leg swelling.  Gastrointestinal: Negative for abdominal distention and abdominal pain.  Endocrine: Negative for hot flashes.  Genitourinary: Positive for frequency. Negative for difficulty urinating and dysuria.   Musculoskeletal: Negative for arthralgias.  Skin: Negative for itching and rash.  Neurological: Positive for numbness. Negative for light-headedness.  Hematological: Negative for adenopathy. Does not bruise/bleed easily.  Psychiatric/Behavioral: Negative for confusion.    MEDICAL HISTORY:  Past Medical History:  Diagnosis Date  . Allergic rhinitis   . Cancer [facility])    new dx Pancreatic Cancer - Aug 2020  . CHF (congestive heart failure) ([place])   . Chronic cholecystitis   . Coronary artery disease   . DDD (degenerative disc disease), cervical   . Dehydration 06/21/2019  . Diabetes mellitus without complication ([place])   . Dyspnea   . Early cataract   . Erectile dysfunction   . GERD (gastroesophageal reflux disease)   . Gouty arthritis   . Headache    occasional migraines  . Hypercalcemia   . Hyperlipemia   . Hypertension   . Palpitations   . Peripheral vascular disease ([place])   . 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   LIMA to LAD SVG to [place] SVG SEQUENTIALLY to OM1 and OM2 SVG to ACUTE MARGINAL  . Spinal stenosis of cervical region   . Vitamin D deficiency     SURGICAL HISTORY: Past Surgical History:  Procedure Laterality Date  . BACK SURGERY  2018    fusion with screws  . CHOLECYSTECTOMY N/A 11/13/2018   Procedure: LAPAROSCOPIC CHOLECYSTECTOMY;  Surgeon: Vickie Epley, MD;  Location: [facility];  Service: General;  Laterality: N/A;  . COLONOSCOPY    . CORONARY ARTERY BYPASS GRAFT N/A 09/30/2017   Procedure: CORONARY ARTERY BYPASS GRAFTING times five using right and left Saphaneous vein harvested endoscopicly  and left internal mammary artery. (CABG),TEE;  Surgeon: Rexene Alberts, MD;  Location: [place];  Service: Open Heart Surgery;   Laterality: N/A;  . LEFT HEART CATH AND CORONARY ANGIOGRAPHY Left 09/06/2017   Procedure: LEFT HEART CATH AND CORONARY ANGIOGRAPHY;  Surgeon: Corey Skains, MD;  Location: [place] [facility];  Service: Cardiovascular;  Laterality: Left;  . percutaneous transluminal balloon angioplasty  01/2010   of left lower extremity  . PORTA CATH INSERTION N/A 06/06/2019   Procedure: PORTA CATH INSERTION;  Surgeon: Katha Cabal, MD;  Location: [place] [facility];  Service: Cardiovascular;  Laterality: N/A;  . TEE WITHOUT CARDIOVERSION N/A 09/30/2017   Procedure: TRANSESOPHAGEAL ECHOCARDIOGRAM (TEE);  Surgeon: Rexene Alberts, MD;  Location: [place];  Service: Open Heart Surgery;  Laterality: N/A;  . VASCULAR SURGERY Left 2010   left exernal iliac and superficial femoral artery PTCA and stenting    SOCIAL HISTORY: Social History   Socioeconomic History  . Marital status: Married    Spouse name: Enid Derry  . Number of children: 2  . Years of education: Not on file  . Highest education level: Not on file  Occupational History  . Occupation: drove tractors    Comment: retired  Tobacco Use  . Smoking status: Former Smoker    Packs/day: 0.75    Years: 50.00    Pack years: 37.50    Types: Cigarettes    Quit date: 09/27/2017    Years since quitting: 2.3  . Smokeless tobacco: Former Systems developer  Types: Chew  Substance and Sexual Activity  . Alcohol use: Not Currently    Comment: stopped drinking before cabg  . Drug use: Not Currently    Types: Marijuana    Comment: stopped smoking before CABG  . Sexual activity: Not Currently  Other Topics Concern  . Not on file  Social History Narrative  . Not on file   Social Determinants of Health   Financial Resource Strain: Low Risk   . Difficulty of Paying Living Expenses: Not hard at all  Food Insecurity: No Food Insecurity  . Worried About Charity fundraiser in the Last Year: Never true  . Ran Out of Food in the Last Year: Never true    Transportation Needs: No Transportation Needs  . Lack of Transportation (Medical): No  . Lack of Transportation (Non-Medical): No  Physical Activity: Sufficiently Active  . Days of Exercise per Week: 5 days  . Minutes of Exercise per Session: 150+ min  Stress: No Stress Concern Present  . Feeling of Stress : Not at all  Social Connections: Somewhat Isolated  . Frequency of Communication with Friends and Family: More than three times a week  . Frequency of Social Gatherings with Friends and Family: More than three times a week  . Attends Religious Services: Never  . Active Member of Clubs or Organizations: No  . Attends Archivist Meetings: Never  . Marital Status: Married  Human resources officer Violence:   . Fear of Current or Ex-Partner:   . Emotionally Abused:   [name] Kitchen Physically Abused:   . Sexually Abused:     FAMILY HISTORY: Family History  Problem Relation Age of Onset  . Brain cancer Mother   . Emphysema Father   . Cancer Brother     ALLERGIES:  is allergic to lipitor [atorvastatin]; zetia [ezetimibe]; lisinopril; protonix [pantoprazole]; and spironolactone.  MEDICATIONS:  Current Outpatient Medications  Medication Sig Dispense Refill  . aspirin EC 81 MG tablet Take 81 mg by mouth daily.    . carvedilol (COREG) 6.25 MG tablet Take 6.25 mg by mouth 2 (two) times daily with a meal.  3  . diltiazem (CARDIZEM CD) 300 MG 24 hr capsule TAKE 1 CAPSULE BY MOUTH EVERY DAY 90 capsule 1  . dorzolamide-timolol (COSOPT) 22.3-6.8 MG/ML ophthalmic solution INSTILL ONE DROP INTO BOTH EYES TWICE DAILY    . ferrous sulfate 325 (65 FE) MG tablet TAKE 1 TABLET BY MOUTH EVERY DAY WITH BREAKFAST 90 tablet 1  . gabapentin (NEURONTIN) 300 MG capsule TAKE 2 CAPSULES (600 MG TOTAL) BY MOUTH 3 (THREE) TIMES DAILY. 540 capsule 0  . ibuprofen (ADVIL) 800 MG tablet Take 1 tablet (800 mg total) by mouth every 8 (eight) hours as needed. 30 tablet 0  . latanoprost (XALATAN) 0.005 % ophthalmic  solution Place 1 drop into both eyes at bedtime.   3  . lidocaine-prilocaine (EMLA) cream APPLY TO AFFECTED AREA ONCE AS DIRECTED    . loperamide (IMODIUM A-D) 2 MG tablet Take 1 tablet (2 mg total) by mouth See admin instructions. Take 2 at diarrhea onset , then 1 every 2hr until 12hrs with no BM. May take 2 every 4hrs at night. If diarrhea recurs repeat. 100 tablet 1  . Multiple Vitamin (MULTIVITAMIN WITH MINERALS) TABS tablet Take 1 tablet by mouth daily.    . OMEGA-3 FATTY ACIDS PO Take 1,200 mg by mouth daily.     [name] Kitchen omeprazole (PRILOSEC) 40 MG capsule TAKE 1 CAPSULE BY MOUTH EVERY DAY 90  capsule 1  . oxyCODONE (OXY IR/ROXICODONE) 5 MG immediate release tablet Take 1 tablet (5 mg total) by mouth every 6 (six) hours as needed for severe pain. 30 tablet 0  . sitaGLIPtin (JANUVIA) 100 MG tablet Take 100 mg by mouth daily.     No current facility-administered medications for this visit.   Facility-Administered Medications Ordered in Other Visits  Medication Dose Route Frequency Provider Last Rate Last Admin  . fluorouracil (ADRUCIL) 4,550 mg in sodium chloride 0.9 % 59 mL chemo infusion  2,400 mg/m2 (Order-Specific) Intravenous 1 day or 1 dose Earlie Server, MD      . irinotecan LIPOSOME (ONIVYDE) 129 mg in sodium chloride 0.9 % 500 mL chemo infusion  67.8 mg/m2 (Order-Specific) Intravenous Once Earlie Server, MD      . leucovorin 800 mg in sodium chloride 0.9 % 250 mL infusion  421 mg/m2 (Order-Specific) Intravenous Once Earlie Server, MD      . sodium chloride flush (NS) 0.9 % injection 10 mL  10 mL Intracatheter PRN Earlie Server, MD      . sodium chloride flush (NS) 0.9 % injection 10 mL  10 mL Intravenous Once Earlie Server, MD      . sodium chloride flush (NS) 0.9 % injection 10 mL  10 mL Intravenous PRN Earlie Server, MD   10 mL at 01/21/20 0842     PHYSICAL EXAMINATION: ECOG PERFORMANCE STATUS: 1 - Symptomatic but completely ambulatory Vitals:   01/21/20 0859  BP: 131/73  Pulse: 91  Resp: 18  Temp: (!) 95.9  F (35.5 C)   Filed Weights   01/21/20 0859  Weight: 170 lb 14.4 oz (77.5 kg)    Physical Exam Constitutional:      General: He is not in acute distress. HENT:     Head: Normocephalic and atraumatic.  Eyes:     General: No scleral icterus. Cardiovascular:     Rate and Rhythm: Normal rate and regular rhythm.     Heart sounds: Normal heart sounds.  Pulmonary:     Effort: Pulmonary effort is normal. No respiratory distress.     Breath sounds: No wheezing.  Abdominal:     General: Bowel sounds are normal. There is no distension.     Palpations: Abdomen is soft.  Musculoskeletal:        General: No deformity. Normal range of motion.     Cervical back: Normal range of motion and neck supple.  Skin:    General: Skin is warm and dry.     Findings: No erythema or rash.  Neurological:     Mental Status: He is alert and oriented to person, place, and time. Mental status is at baseline.     Cranial Nerves: No cranial nerve deficit.     Coordination: Coordination normal.  Psychiatric:        Mood and Affect: Mood normal.     LABORATORY DATA:  I have reviewed the data as listed Lab Results  Component Value Date   WBC 8.5 01/21/2020   HGB 11.0 (L) 01/21/2020   HCT 34.6 (L) 01/21/2020   MCV 89.2 01/21/2020   PLT 177 01/21/2020   Recent Labs    01/07/20 0810 01/07/20 0810 01/15/20 1326 01/15/20 1326 01/15/20 1434 01/20/20 1805 01/21/20 0842  NA 133*   < > 129*   < > 131* 128* 134*  K 4.1   < > 4.4   < > 4.5 4.6 4.1  CL 103   < > 97*   < >  98 96* 101  CO2 23   < > 23   < > '23 23 24  '$ GLUCOSE 403*   < > 565*   < > 539* 651* 367*  BUN 20   < > 29*   < > 29* 23 19  CREATININE 0.96   < > 1.23   < > 1.13 1.05 0.86  CALCIUM 9.3   < > 8.9   < > 8.8* 9.1 9.1  GFRNONAA >60   < > 58*   < > >60 >60 >60  GFRAA >60   < > >60   < > >60 >60 >60  PROT 7.6  --  7.2  --   --   --  6.9  ALBUMIN 3.6  --  3.5  --   --   --  3.5  AST 23  --  35  --   --   --  37  ALT 22  --  54*  --    --   --  66*  ALKPHOS 87  --  86  --   --   --  90  BILITOT 0.6  --  0.8  --   --   --  1.0   < > = values in this interval not displayed.   Iron/TIBC/Ferritin/ %Sat    Component Value Date/Time   IRON 119 04/27/2019 0940   TIBC 363 04/27/2019 0940   FERRITIN 58 04/27/2019 0940   IRONPCTSAT 33 04/27/2019 0940      RADIOGRAPHIC STUDIES: I have personally reviewed the radiological images as listed and agreed with the findings in the report. CT CHEST W CONTRAST  Result Date: 01/02/2020 CLINICAL DATA:  74 year old male with history of pancreatic cancer with liver metastasis. Elevated PSA. EXAM: CT CHEST, ABDOMEN, AND PELVIS WITH CONTRAST TECHNIQUE: Multidetector CT imaging of the chest, abdomen and pelvis was performed following the standard protocol during bolus administration of intravenous contrast. CONTRAST:  177m OMNIPAQUE IOHEXOL 300 MG/ML  SOLN COMPARISON:  CT of the chest abdomen pelvis dated 11/05/2019. FINDINGS: CT CHEST FINDINGS Cardiovascular: There is no cardiomegaly or pericardial effusion. Three-vessel coronary vascular calcification and postsurgical changes of CABG. There is advanced atherosclerotic calcification of the thoracic aorta. No aneurysmal dilatation or dissection. The origins of the great vessels of the aortic arch appear patent as visualized. The central pulmonary arteries appear patent for the degree of opacification. Mediastinum/Nodes: No hilar or mediastinal adenopathy. The esophagus is grossly unremarkable. Several subcentimeter hypodense thyroid nodules. No followup recommended (Ref: J Am Coll Radiol. 2015 Feb;12(2): 143-50). No mediastinal fluid collection. Lungs/Pleura: There is a 4.2 x 3.0 cm ovoid soft tissue mass involving the medial left lung base similar to prior CT. 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 Several small subpleural nodules primarily in the right lung. A 3 mm right upper lobe nodule (series 5, image 64)  similar or minimally increased in size. There is a 5 mm subpleural nodule in the right lower lobe (series 5, image 99) new since the prior CT. Additional 5 mm right lung base subpleural nodule (series 5, image 14) also appears new since the prior CT. There is an 8 mm nodule at the left lung base (series 5, image 111) which has significantly increased in size since the prior CT. A 4 mm left lower lobe nodule (series 5, image 103) is new since the prior CT. These nodules are concerning for metastatic disease. There is no focal consolidation, pleural  effusion, pneumothorax. The central airways are patent. Musculoskeletal: Median sternotomy wires. Right pectoral Port-A-Cath with tip at the cavoatrial junction. There is osteopenia with degenerative changes of the spine. No acute osseous pathology. No definite suspicious osseous lesions. CT ABDOMEN PELVIS FINDINGS No intra-abdominal free air or free fluid. Hepatobiliary: There is a background of fatty infiltration of the liver. Interval increase in the size and number of hepatic hypodense lesions consistent with progression of metastatic disease. The largest lesion measures approximately 3.4 x 2.8 cm in the right lobe of the liver (previously 2.0 x 1.1 cm). No intrahepatic biliary ductal dilatation. Cholecystectomy. Pancreas: There is a 4.0 x 3.0 cm (previously 3.5 x 2.8 cm) soft tissue lesion in the region of the uncinate process of the pancreas (series 3, image 64). There is mild diffuse pancreatic atrophy and dilatation of the main pancreatic duct measuring up to 5 mm in diameter and progressed since the prior CT. There is mild haziness of the peripancreatic fat, likely reactive. Spleen: Normal in size without focal abnormality. Adrenals/Urinary Tract: The adrenal glands are unremarkable. There is no hydronephrosis on either side. Bilateral renal cysts measuring up to 4.3 cm in the inferior pole of the right kidney. There is symmetric enhancement and excretion of  contrast by both kidneys. The visualized ureters and urinary bladder appear unremarkable. Stomach/Bowel: There is moderate stool throughout the colon. No bowel obstruction or active inflammation. The appendix is normal. Vascular/Lymphatic: Advanced aortoiliac atherosclerotic disease. The IVC is grossly unremarkable. Probable mild narrowing of the porta splenic confluence secondary to pancreatic head mass. The SMV, splenic vein, and main portal vein are patent. No portal venous gas. Mildly enlarged pancreatico caval lymph node measures 15 mm (series 3, image 59). Reproductive: Mildly enlarged prostate gland. The seminal vesicles are symmetric. Other: None Musculoskeletal: Osteopenia with degenerative changes of the spine. L4-L5 posterior fusion and disc spacer. No acute osseous pathology. IMPRESSION: 1. Overall progression of hepatic and pulmonary metastatic disease since the prior CT of 11/05/2019. 2. Interval increase in the size of the pancreatic head mass and pancreatic duct dilatation. 3. A 15 mm peripancreatic lymph node, increased in size since the prior CT. 4. Ovoid soft tissue mass in the medial left lung base, similar to prior CT. 5. 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 6. No bowel obstruction. Normal appendix. 7. Aortic Atherosclerosis (ICD10-I70.0). Electronically Signed   By: Anner Crete M.D.   On: 01/02/2020 18:49   CT Chest W Contrast  Result Date: 11/05/2019 CLINICAL DATA:  Pancreatic cancer. Follow up for treatment response. EXAM: CT CHEST, ABDOMEN, AND PELVIS WITH CONTRAST TECHNIQUE: Multidetector CT imaging of the chest, abdomen and pelvis was performed following the standard protocol during bolus administration of intravenous contrast. CONTRAST:  124m OMNIPAQUE IOHEXOL 300 MG/ML  SOLN COMPARISON:  Prior CTs 05/16/2019 and 09/10/2019. PET-CT 06/04/2019. FINDINGS: CT CHEST FINDINGS Cardiovascular: Diffuse atherosclerosis of the aorta, great vessels and coronary  arteries status post median sternotomy and CABG. No acute vascular findings are evident. Right IJ Port-A-Cath extends to the superior cavoatrial junction. Probable aortic valvular calcifications. The heart size is normal. There is no pericardial effusion. Mediastinum/Nodes: There are no enlarged mediastinal, hilar or axillary lymph nodes. Small mediastinal lymph nodes are stable. There is stable minimal nodularity of the thyroid gland, consistent with a benign etiology. The trachea and esophagus appear normal. Lungs/Pleura: No pleural effusion or pneumothorax. Pleural based mass involving the left hemidiaphragm medially is grossly stable, measuring 4.2 x 2.8 cm on image 47/2. This  was not hypermetabolic on PET-CT. Scattered tiny subpleural nodules are stable, likely benign. No new or enlarging pulmonary nodules. Musculoskeletal/Chest wall: No chest wall mass or suspicious osseous findings. There are stable degenerative changes within the thoracic spine and stable mild anterior wedging of the T5 vertebral body. Previous median sternotomy. CT ABDOMEN AND PELVIS FINDINGS Hepatobiliary: There are several ill-defined low-density hepatic lesions which are inapparent on the most recent study, suspicious for recurrent metastatic disease. Largest lesion posteriorly in the dome of the right hepatic lobe may reflect 2 adjacent lesions, measuring 2.1 x 1.2 cm on image 51/2. There is a smaller lesion more anteriorly in the right hepatic lobe measuring 1.1 cm on image 51/2. There is a 1.1 cm lesion inferiorly in the right lobe on image 62/2. Previous cholecystectomy. No significant biliary dilatation. Pancreas: The previously demonstrated low-density mass projecting medially from the uncinate process has resolved. There is stable isodense prominence of the pancreatic head compared with the most recent study, measuring approximately 3.6 x 2.2 cm on image 66/2. No pancreatic ductal dilatation or surrounding inflammation. Spleen:  Normal in size without focal abnormality. Adrenals/Urinary Tract: Both adrenal glands appear normal. There are bilateral renal cysts, largest in the lower pole of the right kidney, measuring 4.0 cm. In the lower pole of the left kidney, there is in 9 mm lesion (image 32/7) which is slightly denser than on the previous study, but unchanged in size, probably a complex cyst. No evidence of urinary tract calculus or hydronephrosis. The bladder appears unremarkable for its degree of distention. Stomach/Bowel: No evidence of bowel wall thickening, distention or surrounding inflammatory change. Chronic gastric wall thickening, similar to previous study. Vascular/Lymphatic: There are no enlarged abdominal or pelvic lymph nodes. Stable small lymph nodes in the porta hepatis. Aortic and branch vessel atherosclerosis. No acute vascular findings. Reproductive: Stable mild enlargement of the prostate gland. Other: Intact anterior abdominal wall. No ascites or peritoneal nodularity. Musculoskeletal: No acute or significant osseous findings. Previous L4-5 fusion. IMPRESSION: 1. Several ill-defined low-density hepatic lesions are inapparent on the most recent study, suspicious for recurrent metastatic disease. Consider further evaluation with abdominal MRI or PET-CT. 2. Grossly stable appearance of the pancreatic head without evidence of local recurrence. No recurrent adenopathy in the chest or abdomen. 3. Stable small pulmonary nodules bilaterally consistent with a benign etiology. 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 Electronically Signed   By: Richardean Sale M.D.   On: 11/05/2019 11:46   CT ABDOMEN PELVIS W CONTRAST  Result Date: 01/02/2020 CLINICAL DATA:  74 year old male with history of pancreatic cancer with liver metastasis. Elevated PSA. EXAM: CT CHEST, ABDOMEN, AND PELVIS WITH CONTRAST TECHNIQUE: Multidetector CT imaging  of the chest, abdomen and pelvis was performed following the standard protocol during bolus administration of intravenous contrast. CONTRAST:  140m OMNIPAQUE IOHEXOL 300 MG/ML  SOLN COMPARISON:  CT of the chest abdomen pelvis dated 11/05/2019. FINDINGS: CT CHEST FINDINGS Cardiovascular: There is no cardiomegaly or pericardial effusion. Three-vessel coronary vascular calcification and postsurgical changes of CABG. There is advanced atherosclerotic calcification of the thoracic aorta. No aneurysmal dilatation or dissection. The origins of the great vessels of the aortic arch appear patent as visualized. The central pulmonary arteries appear patent for the degree of opacification. Mediastinum/Nodes: No hilar or mediastinal adenopathy. The esophagus is grossly unremarkable. Several subcentimeter hypodense thyroid nodules. No followup recommended (Ref: J Am Coll Radiol. 2015 Feb;12(2): 143-50). No mediastinal fluid  collection. Lungs/Pleura: There is a 4.2 x 3.0 cm ovoid soft tissue mass involving the medial left lung base similar to prior CT. 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 Several small subpleural nodules primarily in the right lung. A 3 mm right upper lobe nodule (series 5, image 64) similar or minimally increased in size. There is a 5 mm subpleural nodule in the right lower lobe (series 5, image 99) new since the prior CT. Additional 5 mm right lung base subpleural nodule (series 5, image 14) also appears new since the prior CT. There is an 8 mm nodule at the left lung base (series 5, image 111) which has significantly increased in size since the prior CT. A 4 mm left lower lobe nodule (series 5, image 103) is new since the prior CT. These nodules are concerning for metastatic disease. There is no focal consolidation, pleural effusion, pneumothorax. The central airways are patent. Musculoskeletal: Median sternotomy wires. Right pectoral Port-A-Cath with tip at the  cavoatrial junction. There is osteopenia with degenerative changes of the spine. No acute osseous pathology. No definite suspicious osseous lesions. CT ABDOMEN PELVIS FINDINGS No intra-abdominal free air or free fluid. Hepatobiliary: There is a background of fatty infiltration of the liver. Interval increase in the size and number of hepatic hypodense lesions consistent with progression of metastatic disease. The largest lesion measures approximately 3.4 x 2.8 cm in the right lobe of the liver (previously 2.0 x 1.1 cm). No intrahepatic biliary ductal dilatation. Cholecystectomy. Pancreas: There is a 4.0 x 3.0 cm (previously 3.5 x 2.8 cm) soft tissue lesion in the region of the uncinate process of the pancreas (series 3, image 64). There is mild diffuse pancreatic atrophy and dilatation of the main pancreatic duct measuring up to 5 mm in diameter and progressed since the prior CT. There is mild haziness of the peripancreatic fat, likely reactive. Spleen: Normal in size without focal abnormality. Adrenals/Urinary Tract: The adrenal glands are unremarkable. There is no hydronephrosis on either side. Bilateral renal cysts measuring up to 4.3 cm in the inferior pole of the right kidney. There is symmetric enhancement and excretion of contrast by both kidneys. The visualized ureters and urinary bladder appear unremarkable. Stomach/Bowel: There is moderate stool throughout the colon. No bowel obstruction or active inflammation. The appendix is normal. Vascular/Lymphatic: Advanced aortoiliac atherosclerotic disease. The IVC is grossly unremarkable. Probable mild narrowing of the porta splenic confluence secondary to pancreatic head mass. The SMV, splenic vein, and main portal vein are patent. No portal venous gas. Mildly enlarged pancreatico caval lymph node measures 15 mm (series 3, image 59). Reproductive: Mildly enlarged prostate gland. The seminal vesicles are symmetric. Other: None Musculoskeletal: Osteopenia with  degenerative changes of the spine. L4-L5 posterior fusion and disc spacer. No acute osseous pathology. IMPRESSION: 1. Overall progression of hepatic and pulmonary metastatic disease since the prior CT of 11/05/2019. 2. Interval increase in the size of the pancreatic head mass and pancreatic duct dilatation. 3. A 15 mm peripancreatic lymph node, increased in size since the prior CT. 4. Ovoid soft tissue mass in the medial left lung base, similar to prior CT. 5. 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 6. No bowel obstruction. Normal appendix. 7. Aortic Atherosclerosis (ICD10-I70.0). Electronically Signed   By: Anner Crete M.D.   On: 01/02/2020 18:49   CT Abdomen Pelvis W Contrast  Result Date: 11/05/2019 CLINICAL DATA:  Pancreatic cancer. Follow up for treatment response. EXAM: CT CHEST,  ABDOMEN, AND PELVIS WITH CONTRAST TECHNIQUE: Multidetector CT imaging of the chest, abdomen and pelvis was performed following the standard protocol during bolus administration of intravenous contrast. CONTRAST:  158m OMNIPAQUE IOHEXOL 300 MG/ML  SOLN COMPARISON:  Prior CTs 05/16/2019 and 09/10/2019. PET-CT 06/04/2019. FINDINGS: CT CHEST FINDINGS Cardiovascular: Diffuse atherosclerosis of the aorta, great vessels and coronary arteries status post median sternotomy and CABG. No acute vascular findings are evident. Right IJ Port-A-Cath extends to the superior cavoatrial junction. Probable aortic valvular calcifications. The heart size is normal. There is no pericardial effusion. Mediastinum/Nodes: There are no enlarged mediastinal, hilar or axillary lymph nodes. Small mediastinal lymph nodes are stable. There is stable minimal nodularity of the thyroid gland, consistent with a benign etiology. The trachea and esophagus appear normal. Lungs/Pleura: No pleural effusion or pneumothorax. Pleural based mass involving the left hemidiaphragm medially is grossly stable, measuring 4.2 x 2.8 cm on image 47/2.  This was not hypermetabolic on PET-CT. Scattered tiny subpleural nodules are stable, likely benign. No new or enlarging pulmonary nodules. Musculoskeletal/Chest wall: No chest wall mass or suspicious osseous findings. There are stable degenerative changes within the thoracic spine and stable mild anterior wedging of the T5 vertebral body. Previous median sternotomy. CT ABDOMEN AND PELVIS FINDINGS Hepatobiliary: There are several ill-defined low-density hepatic lesions which are inapparent on the most recent study, suspicious for recurrent metastatic disease. Largest lesion posteriorly in the dome of the right hepatic lobe may reflect 2 adjacent lesions, measuring 2.1 x 1.2 cm on image 51/2. There is a smaller lesion more anteriorly in the right hepatic lobe measuring 1.1 cm on image 51/2. There is a 1.1 cm lesion inferiorly in the right lobe on image 62/2. Previous cholecystectomy. No significant biliary dilatation. Pancreas: The previously demonstrated low-density mass projecting medially from the uncinate process has resolved. There is stable isodense prominence of the pancreatic head compared with the most recent study, measuring approximately 3.6 x 2.2 cm on image 66/2. No pancreatic ductal dilatation or surrounding inflammation. Spleen: Normal in size without focal abnormality. Adrenals/Urinary Tract: Both adrenal glands appear normal. There are bilateral renal cysts, largest in the lower pole of the right kidney, measuring 4.0 cm. In the lower pole of the left kidney, there is in 9 mm lesion (image 32/7) which is slightly denser than on the previous study, but unchanged in size, probably a complex cyst. No evidence of urinary tract calculus or hydronephrosis. The bladder appears unremarkable for its degree of distention. Stomach/Bowel: No evidence of bowel wall thickening, distention or surrounding inflammatory change. Chronic gastric wall thickening, similar to previous study. Vascular/Lymphatic: There are no  enlarged abdominal or pelvic lymph nodes. Stable small lymph nodes in the porta hepatis. Aortic and branch vessel atherosclerosis. No acute vascular findings. Reproductive: Stable mild enlargement of the prostate gland. Other: Intact anterior abdominal wall. No ascites or peritoneal nodularity. Musculoskeletal: No acute or significant osseous findings. Previous L4-5 fusion. IMPRESSION: 1. Several ill-defined low-density hepatic lesions are inapparent on the most recent study, suspicious for recurrent metastatic disease. Consider further evaluation with abdominal MRI or PET-CT. 2. Grossly stable appearance of the pancreatic head without evidence of local recurrence. No recurrent adenopathy in the chest or abdomen. 3. Stable small pulmonary nodules bilaterally consistent with a benign etiology. 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 Electronically Signed   By: WRichardean SaleM.D.   On: 11/05/2019 11:46      ASSESSMENT &  PLAN:  1. Pancreatic cancer metastasized to liver ([place])   2. Liver metastasis ([place])   3. Hyperglycemia   4. Encounter for antineoplastic chemotherapy     #Metastatic pancreatic cancer- liver mets, hilar nodal metastasis, ?bone mets, CA 19-9 has trended up to 60741  Labs reviewed and discussed with patient. Okay to proceed with cycle two 5-FU and liposomal irinotecan, he tolerated cycle 1 reduced liposomal irinotecan. CA 19.9 continues to rise. I will increase to standard dose with liposomal irinotecan for cycle 2.   # uncontrolled diabetes/ hyperglycemia, on Januvia. Persistently hyperglycemia, and multiple ER visits.   I encourage patient to consider starting insulin treatments. He says he will further discuss with his PCP.  #Normocytic anemia, likely secondary to chemo.  Hemoglobin is stable.  #Epigastric pain, likely neoplasm related. He uses Tylenol PRN. Not needing oxycodone yet. He  has  oxycodone 5 mg every 6 hours Rx.  All questions were answered. The patient knows to call the clinic with any problems questions or concerns. Follow-up  2 weeks.   Earlie Server, MD, PhD Hematology Oncology [place] [facility] 01/21/2020

## 2020-01-21 NOTE — Progress Notes (Signed)
Patient here for follow up. He has started taking Januvia 100 mg daily. Pt went to ER last night again due to elevated Blood sugar in the 500s. Pt states head feels "stuffy."

## 2020-01-22 ENCOUNTER — Other Ambulatory Visit: Payer: Self-pay | Admitting: Physician Assistant

## 2020-01-22 ENCOUNTER — Other Ambulatory Visit: Payer: Self-pay

## 2020-01-22 ENCOUNTER — Ambulatory Visit (INDEPENDENT_AMBULATORY_CARE_PROVIDER_SITE_OTHER): Payer: Medicare Other | Admitting: Physician Assistant

## 2020-01-22 ENCOUNTER — Ambulatory Visit: Payer: Medicare Other

## 2020-01-22 DIAGNOSIS — E1165 Type 2 diabetes mellitus with hyperglycemia: Secondary | ICD-10-CM | POA: Diagnosis not present

## 2020-01-22 MED ORDER — BASAGLAR KWIKPEN 100 UNIT/ML ~~LOC~~ SOPN: 10.0000 [IU] | PEN_INJECTOR | Freq: Every day | SUBCUTANEOUS | 0 refills | Status: DC

## 2020-01-22 NOTE — Progress Notes (Signed)
Established patient visit     Patient: [name]   DOB: [birth year]   74 y.o. Male  MRN: [id number] Visit Date: 01/22/2020  Today's healthcare provider: Trinna Post, PA-C  Subjective:    Cra[facility] [name],acting as a scribe for Craig Post, PA-C.,have documented all relevant documentation on the behalf of Craig Post, PA-C,as directed by  Craig Post, PA-C while in the presence of Craig Post, PA-C.  Chief Complaint  Patient presents with  . Hospitalization Follow-up   HPI Follow up Hospitalization  Patient was admitted to [name] on 01/20/2020 and discharged on 01/21/2020. He was treated for hyperglycemia. Treatment for this included IV, labs and insulin. Telephone follow up was done on never. He reports good compliance with treatment. He reports this condition is stayed the same. Blood sugar arranges:300's and  higher ------------------------------------------------------------------------------------------------------------      Medications: Outpatient Medications Prior to Visit  Medication Sig  . aspirin EC 81 MG tablet Take 81 mg by mouth daily.  . carvedilol (COREG) 6.25 MG tablet Take 6.25 mg by mouth 2 (two) times daily with a meal.  . diltiazem (CARDIZEM CD) 300 MG 24 hr capsule TAKE 1 CAPSULE BY MOUTH EVERY DAY  . dorzolamide-timolol (COSOPT) 22.3-6.8 MG/ML ophthalmic solution INSTILL ONE DROP INTO BOTH EYES TWICE DAILY  . ferrous sulfate 325 (65 FE) MG tablet TAKE 1 TABLET BY MOUTH EVERY DAY WITH BREAKFAST  . gabapentin (NEURONTIN) 300 MG capsule TAKE 2 CAPSULES (600 MG TOTAL) BY MOUTH 3 (THREE) TIMES DAILY.  [name] Kitchen ibuprofen (ADVIL) 800 MG tablet Take 1 tablet (800 mg total) by mouth every 8 (eight) hours as needed.  . latanoprost (XALATAN) 0.005 % ophthalmic solution Place 1 drop into both eyes at bedtime.   . lidocaine-prilocaine (EMLA) cream APPLY TO AFFECTED AREA ONCE AS DIRECTED  . loperamide (IMODIUM A-D) 2 MG tablet Take 1 tablet  (2 mg total) by mouth See admin instructions. Take 2 at diarrhea onset , then 1 every 2hr until 12hrs with no BM. May take 2 every 4hrs at night. If diarrhea recurs repeat.  . Multiple Vitamin (MULTIVITAMIN WITH MINERALS) TABS tablet Take 1 tablet by mouth daily.  . OMEGA-3 FATTY ACIDS PO Take 1,200 mg by mouth daily.   [name] Kitchen omeprazole (PRILOSEC) 40 MG capsule TAKE 1 CAPSULE BY MOUTH EVERY DAY  . oxyCODONE (OXY IR/ROXICODONE) 5 MG immediate release tablet Take 1 tablet (5 mg total) by mouth every 6 (six) hours as needed for severe pain.  . sitaGLIPtin (JANUVIA) 100 MG tablet Take 100 mg by mouth daily.   Facility-Administered Medications Prior to Visit  Medication Dose Route Frequency Provider  . sodium chloride flush (NS) 0.9 % injection 10 mL  10 mL Intracatheter PRN Craig Server, MD  . sodium chloride flush (NS) 0.9 % injection 10 mL  10 mL Intravenous Once Craig Server, MD    Review of Systems  Constitutional: Negative.   Respiratory: Negative.   Cardiovascular: Negative for palpitations and leg swelling.  Skin: Negative for rash.  Hematological: Does not bruise/bleed easily.        Objective:    There were no vitals taken for this visit.   Physical Exam Constitutional:      Appearance: Normal appearance.  Cardiovascular:     Rate and Rhythm: Normal rate and regular rhythm.  Pulmonary:     Effort: Pulmonary effort is normal.     Breath sounds: Normal breath sounds.  Skin:    General: Skin  is warm and dry.  Neurological:     General: No focal deficit present.     Mental Status: He is alert and oriented to person, place, and time.       No results found for any visits on 01/22/20.    Assessment & Plan:    1. Uncontrolled type 2 diabetes mellitus with hyperglycemia ([name]) Started 10 units of Basaglar insulin at bedtime. First dose given in office by patient himself. Continue Januvia 100 mg daily. - Insulin Glargine (BASAGLAR KWIKPEN) 100 UNIT/ML; Inject 0.1 mLs (10 Units total)  into the skin at bedtime.  Dispense: 5 pen; Refill: [name], [name] [phone] (phone) [phone] (fax)  [name]

## 2020-01-22 NOTE — Patient Instructions (Addendum)
Insulin Glargine injection What is this medicine? INSULIN GLARGINE (IN su lin GLAR geen) is a human-made form of insulin. This drug lowers the amount of sugar in your blood. It is a long-acting insulin that is usually given once a day. This medicine may be used for other purposes; ask your health care provider or pharmacist if you have questions. COMMON BRAND NAME(S): BASAGLAR, Lantus, Lantus SoloStar, Semglee, Toujeo Max SoloStar, [employer] What should I tell my health care provider before I take this medicine? They need to know if you have any of these conditions:  episodes of low blood sugar  eye disease, vision problems  kidney disease  liver disease  an unusual or allergic reaction to insulin, metacresol, other medicines, foods, dyes, or preservatives  pregnant or trying to get pregnant  breast-feeding How should I use this medicine? This medicine is for injection under the skin. Use this medicine at the same time each day. Use exactly as directed. This insulin should never be mixed in the same syringe with other insulins before injection. Do not vigorously shake before use. You will be taught how to use this medicine and how to adjust doses for activities and illness. Do not use more insulin than prescribed. Always check the appearance of your insulin before using it. This medicine should be clear and colorless like water. Do not use it if it is cloudy, thickened, colored, or has solid particles in it. If you use an insulin pen, be sure to take off the outer needle cover before using the dose. It is important that you put your used needles and syringes in a special sharps container. Do not put them in a trash can. If you do not have a sharps container, call your pharmacist or healthcare provider to get one. This drug comes with INSTRUCTIONS FOR USE. Ask your pharmacist for directions on how to use this drug. Read the information carefully. Talk to your pharmacist or health  care provider if you have questions. Talk to your pediatrician regarding the use of this medicine in children. While this drug may be prescribed for children as young as 6 years for selected conditions, precautions do apply. Overdosage: If you think you have taken too much of this medicine contact a poison control center or emergency room at once. NOTE: This medicine is only for you. Do not share this medicine with others. What if I miss a dose? It is important not to miss a dose. Your health care professional or doctor should discuss a plan for missed doses with you. If you do miss a dose, follow their plan. Do not take double doses. What may interact with this medicine?  other medicines for diabetes Many medications may cause changes in blood sugar, these include:  alcohol containing beverages  antiviral medicines for HIV or AIDS  aspirin and aspirin-like drugs  certain medicines for blood pressure, heart disease, irregular heart beat  chromium  diuretics  male hormones, such as estrogens or progestins, birth control pills  fenofibrate  gemfibrozil  isoniazid  lanreotide  male hormones or anabolic steroids  MAOIs like Carbex, Eldepryl, Marplan, Nardil, and Parnate  medicines for weight loss  medicines for allergies, asthma, cold, or cough  medicines for depression, anxiety, or psychotic disturbances  niacin  nicotine  NSAIDs, medicines for pain and inflammation, like ibuprofen or naproxen  octreotide  pasireotide  pentamidine  phenytoin  probenecid  quinolone antibiotics such as ciprofloxacin, levofloxacin, ofloxacin  some herbal dietary supplements  steroid medicines  such as prednisone or cortisone  sulfamethoxazole; trimethoprim  thyroid hormones Some medications can hide the warning symptoms of low blood sugar (hypoglycemia). You may need to monitor your blood sugar more closely if you are taking one of these medications. These  include:  beta-blockers, often used for high blood pressure or heart problems (examples include atenolol, metoprolol, propranolol)  clonidine  guanethidine  reserpine This list may not describe all possible interactions. Give your health care provider a list of all the medicines, herbs, non-prescription drugs, or dietary supplements you use. Also tell them if you smoke, drink alcohol, or use illegal drugs. Some items may interact with your medicine. What should I watch for while using this medicine? Visit your health care professional or doctor for regular checks on your progress. Do not drive, use machinery, or do anything that needs mental alertness until you know how this medicine affects you. Alcohol may interfere with the effect of this medicine. Avoid alcoholic drinks. A test called the HbA1C (A1C) will be monitored. This is a simple blood test. It measures your blood sugar control over the last 2 to 3 months. You will receive this test every 3 to 6 months. Learn how to check your blood sugar. Learn the symptoms of low and high blood sugar and how to manage them. Always carry a quick-source of sugar with you in case you have symptoms of low blood sugar. Examples include hard sugar candy or glucose tablets. Make sure others know that you can choke if you eat or drink when you develop serious symptoms of low blood sugar, such as seizures or unconsciousness. They must get medical help at once. Tell your doctor or health care professional if you have high blood sugar. You might need to change the dose of your medicine. If you are sick or exercising more than usual, you might need to change the dose of your medicine. Do not skip meals. Ask your doctor or health care professional if you should avoid alcohol. Many nonprescription cough and cold products contain sugar or alcohol. These can affect blood sugar. Make sure that you have the right kind of syringe for the type of insulin you use. Try not  to change the brand and type of insulin or syringe unless your health care professional or doctor tells you to. Switching insulin brand or type can cause dangerously high or low blood sugar. Always keep an extra supply of insulin, syringes, and needles on hand. Use a syringe one time only. Throw away syringe and needle in a closed container to prevent accidental needle sticks. Insulin pens and cartridges should never be shared. Even if the needle is changed, sharing may result in passing of viruses like hepatitis or HIV. Each time you get a new box of pen needles, check to see if they are the same type as the ones you were trained to use. If not, ask your health care professional to show you how to use this new type properly. Wear a medical ID bracelet or chain, and carry a card that describes your disease and details of your medicine and dosage times. What side effects may I notice from receiving this medicine? Side effects that you should report to your doctor or health care professional as soon as possible:  allergic reactions like skin rash, itching or hives, swelling of the face, lips, or tongue  breathing problems  signs and symptoms of high blood sugar such as dizziness, dry mouth, dry skin, fruity breath, nausea, stomach pain,  increased hunger or thirst, increased urination  signs and symptoms of low blood sugar such as feeling anxious, confusion, dizziness, increased hunger, unusually weak or tired, sweating, shakiness, cold, irritable, headache, blurred vision, fast heartbeat, loss of consciousness Side effects that usually do not require medical attention (report to your doctor or health care professional if they continue or are bothersome):  increase or decrease in fatty tissue under the skin due to overuse of a particular injection site  itching, burning, swelling, or rash at site where injected This list may not describe all possible side effects. Call your doctor for medical advice  about side effects. You may report side effects to FDA at 1-800-FDA-1088. Where should I keep my medicine? Keep out of the reach of children. Unopened Vials: Lantus vials: Store in a refrigerator between 2 and 8 degrees C (36 and 46 degrees F) or at room temperature below 30 degrees C (86 degrees F). Do not freeze or use if the insulin has been frozen. Protect from light and excessive heat. If stored at room temperature, the vial must be discarded after 28 days. Throw away any unopened and unused medicine that has been stored in the refrigerator after the expiration date. Unopened Pens: Neurosurgeon: Store in a refrigerator between 2 and 8 degrees C (36 and 46 degrees F) or at room temperature below 30 degrees C (86 degrees F). Do not freeze or use if the insulin has been frozen. Protect from light and excessive heat. If stored at room temperature, the pen must be discarded after 28 days. Throw away any unopened and unused medicine that has been stored in the refrigerator after the expiration date. Lantus Solostar Pens: Store in a refrigerator between 2 and 8 degrees C (36 and 46 degrees F) or at room temperature below 30 degrees C (86 degrees F). Do not freeze or use if the insulin has been frozen. Protect from light and excessive heat. If stored at room temperature, the pen must be discarded after 28 days. Throw away any unopened and unused medicine that has been stored in the refrigerator after the expiration date. Semglee Pens: Store in a refrigerator between 2 and 8 degrees C (36 and 46 degrees F) or at room temperature below 30 degrees C (86 degrees F). Do not freeze or use if the insulin has been frozen. Protect from light and excessive heat. If stored at room temperature, the pen must be discarded after 28 days. Throw away any unopened and unused medicine that has been stored in the refrigerator after the expiration date. Toujeo Solostar Pens or Toujeo Max [employer] Pens: Store in a refrigerator  between 2 and 8 degrees C (36 and 46 degrees F). Do not freeze or use if the insulin has been frozen. Protect from light and excessive heat. Throw away any unopened and unused medicine that has been stored in the refrigerator after the expiration date. Vials that you are using: Lantus vials: Store in a refrigerator or at room temperature below 30 degrees C (86 degrees F). Do not freeze. Keep away from heat and light. Throw the opened vial away after 28 days. Semglee vials: Store in a refrigerator or at room temperature below 30 degrees C (86 degrees F). Do not freeze. Keep away from heat and light. Throw the opened vial away after 28 days. Pens that you are using: Basaglar KwikPens: Store at room temperature below 30 degrees C (86 degrees F). Do not refrigerate or freeze. Keep away from heat and light.  Throw the pen away after 28 days, even if it still has insulin left in it. Lantus Solostar Pens: Store at room temperature below 30 degrees C (86 degrees F). Do not refrigerate or freeze. Keep away from heat and light. Throw the pen away after 28 days, even if it still has insulin left in it. Semglee Pens: Store at room temperature below 30 degrees C (86 degrees F). Do not refrigerate or freeze. Keep away from heat and light. Throw the pen away after 28 days, even if it still has insulin left in it. Toujeo Solostar Pens or Toujeo Max [employer] Pens: Store at room temperature below 30 degrees C (86 degrees F). Do not refrigerate or freeze. Keep away from heat and light. Throw the pen away after 56 days, even if it still has insulin left in it. NOTE: This sheet is a summary. It may not cover all possible information. If you have questions about this medicine, talk to your doctor, pharmacist, or health care provider.  2020 Elsevier/Gold Standard (2019-07-18 15:00:23)  Started 10 units of Basaglar insulin at bedtime. Remove cap from pen, check for clear medicine, screw needle onto pen, remove clear safety cap  from needle, then remove green cap from needle, prime the pen with 2 units, check for air bubbles, then inject 10 units. Rotate inject sites. DO NOT inject in same site.

## 2020-01-22 NOTE — Telephone Encounter (Signed)
Patient was advised that medication was changed to Lantus.

## 2020-01-22 NOTE — Telephone Encounter (Signed)
The insulin will be changed from basaglar to lantus. The units and the process is the exact the same.

## 2020-01-22 NOTE — Telephone Encounter (Signed)
Pharmacy is requesting a different med d/t pt's insurance not covering it.  Rx #: [id number]    Pharmacy comment: Alternative Requested:THE PRESCRIBED MEDICATION IS NOT COVERED BY INSURANCE. PLEASE CONSIDER CHANGING TO ONE OF THE SUGGESTED COVERED ALTERNATIVES.   Routing request to [facility].

## 2020-01-22 NOTE — Chronic Care Management (AMB) (Signed)
  Chronic Care Management   Note  01/22/2020 Name: Craig [name] MRN: [id number] DOB: [birth year]   Brief outreach with Craig [name]. He was recently referred to the chronic care management team for care coordination and care management. He reports preparing for an acute appointment with his primary care provider today. He is agreeable to outreach later this week.    Follow up plan: The care management team will follow up with Craig [name] on 01/24/20.    [name] [facility] Management [phone]

## 2020-01-23 ENCOUNTER — Inpatient Hospital Stay: Payer: Medicare Other

## 2020-01-23 VITALS — BP 121/68 | HR 79 | Temp 97.0°F | Resp 18

## 2020-01-23 DIAGNOSIS — C259 Malignant neoplasm of pancreas, unspecified: Secondary | ICD-10-CM

## 2020-01-23 MED ORDER — SODIUM CHLORIDE 0.9% FLUSH
10.0000 mL | INTRAVENOUS | Status: DC | PRN
  Administered 2020-01-23: 10 mL
  Filled 2020-01-23: qty 10

## 2020-01-23 MED ORDER — HEPARIN SOD (PORK) LOCK FLUSH 100 UNIT/ML IV SOLN
500.0000 [IU] | Freq: Once | INTRAVENOUS | Status: AC | PRN
  Administered 2020-01-23: 500 [IU]
  Filled 2020-01-23: qty 5

## 2020-01-23 NOTE — Progress Notes (Signed)
Pt reports that his blood sugar was over 400 yesterday and today. Pt states that he is experiencing increased fatigue today, but that he worked outside yesterday. Pt states that he saw his PCP yesterday and discussed his blood sugars and that Patient's PCP placed him on an insulin once at night. Dr. Tasia Catchings aware, per Dr. Tasia Catchings pt may be discharged home. Pt educated on the importance of following up with PCP if blood sugars remain elevated or report to ER/call 911 in the event of an emergency. Pt verbalizes understanding.

## 2020-01-24 ENCOUNTER — Inpatient Hospital Stay
Admission: EM | Admit: 2020-01-24 | Discharge: 2020-01-26 | DRG: 638 | Disposition: A | Discharge status: Home / Self Care | Payer: Medicare Other | Attending: Internal Medicine | Admitting: Internal Medicine

## 2020-01-24 ENCOUNTER — Other Ambulatory Visit: Payer: Self-pay

## 2020-01-24 ENCOUNTER — Ambulatory Visit (INDEPENDENT_AMBULATORY_CARE_PROVIDER_SITE_OTHER): Payer: Medicare Other | Admitting: Physician Assistant

## 2020-01-24 ENCOUNTER — Ambulatory Visit: Payer: Self-pay

## 2020-01-24 DIAGNOSIS — E785 Hyperlipidemia, unspecified: Secondary | ICD-10-CM | POA: Diagnosis present

## 2020-01-24 DIAGNOSIS — E119 Type 2 diabetes mellitus without complications: Secondary | ICD-10-CM | POA: Diagnosis not present

## 2020-01-24 DIAGNOSIS — E871 Hypo-osmolality and hyponatremia: Secondary | ICD-10-CM | POA: Diagnosis present

## 2020-01-24 DIAGNOSIS — I10 Essential (primary) hypertension: Secondary | ICD-10-CM | POA: Diagnosis not present

## 2020-01-24 DIAGNOSIS — R739 Hyperglycemia, unspecified: Secondary | ICD-10-CM

## 2020-01-24 DIAGNOSIS — I119 Hypertensive heart disease without heart failure: Secondary | ICD-10-CM | POA: Diagnosis present

## 2020-01-24 DIAGNOSIS — C259 Malignant neoplasm of pancreas, unspecified: Secondary | ICD-10-CM | POA: Diagnosis not present

## 2020-01-24 DIAGNOSIS — E0910 Drug or chemical induced diabetes mellitus with ketoacidosis without coma: Secondary | ICD-10-CM

## 2020-01-24 DIAGNOSIS — G893 Neoplasm related pain (acute) (chronic): Secondary | ICD-10-CM | POA: Diagnosis present

## 2020-01-24 DIAGNOSIS — D630 Anemia in neoplastic disease: Secondary | ICD-10-CM | POA: Diagnosis present

## 2020-01-24 DIAGNOSIS — K8681 Exocrine pancreatic insufficiency: Secondary | ICD-10-CM | POA: Diagnosis present

## 2020-01-24 DIAGNOSIS — Z66 Do not resuscitate: Secondary | ICD-10-CM | POA: Diagnosis present

## 2020-01-24 DIAGNOSIS — E1110 Type 2 diabetes mellitus with ketoacidosis without coma: Principal | ICD-10-CM | POA: Diagnosis present

## 2020-01-24 DIAGNOSIS — Z79899 Other long term (current) drug therapy: Secondary | ICD-10-CM

## 2020-01-24 DIAGNOSIS — E860 Dehydration: Secondary | ICD-10-CM

## 2020-01-24 DIAGNOSIS — E782 Mixed hyperlipidemia: Secondary | ICD-10-CM

## 2020-01-24 DIAGNOSIS — Z888 Allergy status to other drugs, medicaments and biological substances status: Secondary | ICD-10-CM | POA: Diagnosis not present

## 2020-01-24 DIAGNOSIS — E1151 Type 2 diabetes mellitus with diabetic peripheral angiopathy without gangrene: Secondary | ICD-10-CM | POA: Diagnosis present

## 2020-01-24 DIAGNOSIS — E440 Moderate protein-calorie malnutrition: Secondary | ICD-10-CM | POA: Diagnosis present

## 2020-01-24 DIAGNOSIS — Z794 Long term (current) use of insulin: Secondary | ICD-10-CM | POA: Diagnosis not present

## 2020-01-24 DIAGNOSIS — K219 Gastro-esophageal reflux disease without esophagitis: Secondary | ICD-10-CM | POA: Diagnosis present

## 2020-01-24 DIAGNOSIS — Z87891 Personal history of nicotine dependence: Secondary | ICD-10-CM

## 2020-01-24 DIAGNOSIS — N179 Acute kidney failure, unspecified: Secondary | ICD-10-CM | POA: Diagnosis present

## 2020-01-24 DIAGNOSIS — E875 Hyperkalemia: Secondary | ICD-10-CM | POA: Diagnosis present

## 2020-01-24 DIAGNOSIS — Z951 Presence of aortocoronary bypass graft: Secondary | ICD-10-CM | POA: Diagnosis not present

## 2020-01-24 DIAGNOSIS — C250 Malignant neoplasm of head of pancreas: Secondary | ICD-10-CM | POA: Diagnosis present

## 2020-01-24 DIAGNOSIS — R824 Acetonuria: Secondary | ICD-10-CM

## 2020-01-24 DIAGNOSIS — Z6824 Body mass index (BMI) 24.0-24.9, adult: Secondary | ICD-10-CM

## 2020-01-24 DIAGNOSIS — Z9221 Personal history of antineoplastic chemotherapy: Secondary | ICD-10-CM | POA: Diagnosis not present

## 2020-01-24 DIAGNOSIS — E1140 Type 2 diabetes mellitus with diabetic neuropathy, unspecified: Secondary | ICD-10-CM | POA: Diagnosis present

## 2020-01-24 DIAGNOSIS — Z20822 Contact with and (suspected) exposure to covid-19: Secondary | ICD-10-CM | POA: Diagnosis present

## 2020-01-24 DIAGNOSIS — Z8679 Personal history of other diseases of the circulatory system: Secondary | ICD-10-CM

## 2020-01-24 DIAGNOSIS — E0810 Diabetes mellitus due to underlying condition with ketoacidosis without coma: Secondary | ICD-10-CM

## 2020-01-24 DIAGNOSIS — C787 Secondary malignant neoplasm of liver and intrahepatic bile duct: Secondary | ICD-10-CM

## 2020-01-24 DIAGNOSIS — I110 Hypertensive heart disease with heart failure: Secondary | ICD-10-CM | POA: Diagnosis present

## 2020-01-24 DIAGNOSIS — Z7982 Long term (current) use of aspirin: Secondary | ICD-10-CM

## 2020-01-24 DIAGNOSIS — I2510 Atherosclerotic heart disease of native coronary artery without angina pectoris: Secondary | ICD-10-CM | POA: Diagnosis present

## 2020-01-24 DIAGNOSIS — E1165 Type 2 diabetes mellitus with hyperglycemia: Secondary | ICD-10-CM | POA: Diagnosis not present

## 2020-01-24 LAB — HEMOGLOBIN A1C
Hgb A1c MFr Bld: 10.7 % — ABNORMAL HIGH (ref 4.8–5.6)
Mean Plasma Glucose: 260.39 mg/dL

## 2020-01-24 LAB — POCT URINALYSIS DIPSTICK
Bilirubin, UA: NEGATIVE
Blood, UA: NEGATIVE
Glucose, UA: POSITIVE — AB
Ketones, UA: 60
Leukocytes, UA: NEGATIVE
Nitrite, UA: NEGATIVE
Protein, UA: NEGATIVE
Spec Grav, UA: 1.02 (ref 1.010–1.025)
Urobilinogen, UA: 0.2 E.U./dL
pH, UA: 6 (ref 5.0–8.0)

## 2020-01-24 LAB — BASIC METABOLIC PANEL
Anion gap: 19 — ABNORMAL HIGH (ref 5–15)
Anion gap: 7 (ref 5–15)
BUN: 57 mg/dL — ABNORMAL HIGH (ref 8–23)
BUN: 37 mg/dL — ABNORMAL HIGH (ref 8–23)
CO2: 16 mmol/L — ABNORMAL LOW (ref 22–32)
CO2: 23 mmol/L (ref 22–32)
Calcium: 9.4 mg/dL (ref 8.9–10.3)
Calcium: 8.9 mg/dL (ref 8.9–10.3)
Chloride: 93 mmol/L — ABNORMAL LOW (ref 98–111)
Chloride: 107 mmol/L (ref 98–111)
Creatinine, Ser: 1.43 mg/dL — ABNORMAL HIGH (ref 0.61–1.24)
Creatinine, Ser: 0.91 mg/dL (ref 0.61–1.24)
GFR calc Af Amer: 56 mL/min — ABNORMAL LOW (ref >60)
GFR calc Af Amer: >60 mL/min (ref >60)
GFR calc non Af Amer: 48 mL/min — ABNORMAL LOW (ref >60)
GFR calc non Af Amer: >60 mL/min (ref >60)
Glucose, Bld: 649 mg/dL (ref 70–99)
Glucose, Bld: 184 mg/dL — ABNORMAL HIGH (ref 70–99)
Potassium: 5.3 mmol/L — ABNORMAL HIGH (ref 3.5–5.1)
Potassium: 4.2 mmol/L (ref 3.5–5.1)
Sodium: 128 mmol/L — ABNORMAL LOW (ref 135–145)
Sodium: 137 mmol/L (ref 135–145)

## 2020-01-24 LAB — CBC
HCT: 35.5 % — ABNORMAL LOW (ref 39.0–52.0)
Hemoglobin: 11.3 g/dL — ABNORMAL LOW (ref 13.0–17.0)
MCH: 28.7 pg (ref 26.0–34.0)
MCHC: 31.8 g/dL (ref 30.0–36.0)
MCV: 90.1 fL (ref 80.0–100.0)
Platelets: 239 10*3/uL (ref 150–400)
RBC: 3.94 MIL/uL — ABNORMAL LOW (ref 4.22–5.81)
RDW: 14.6 % (ref 11.5–15.5)
WBC: 8.1 10*3/uL (ref 4.0–10.5)
nRBC: 0 % (ref 0.0–0.2)

## 2020-01-24 LAB — GLUCOSE, CAPILLARY
Glucose-Capillary: >600 mg/dL (ref 70–99)
Glucose-Capillary: 471 mg/dL — ABNORMAL HIGH (ref 70–99)
Glucose-Capillary: 309 mg/dL — ABNORMAL HIGH (ref 70–99)
Glucose-Capillary: 278 mg/dL — ABNORMAL HIGH (ref 70–99)
Glucose-Capillary: 267 mg/dL — ABNORMAL HIGH (ref 70–99)
Glucose-Capillary: 201 mg/dL — ABNORMAL HIGH (ref 70–99)
Glucose-Capillary: 177 mg/dL — ABNORMAL HIGH (ref 70–99)
Glucose-Capillary: 176 mg/dL — ABNORMAL HIGH (ref 70–99)

## 2020-01-24 LAB — RESPIRATORY PANEL BY RT PCR (FLU A&B, COVID)
Influenza A by PCR: NEGATIVE
Influenza B by PCR: NEGATIVE
SARS Coronavirus 2 by RT PCR: NEGATIVE

## 2020-01-24 LAB — GLUCOSE, POCT (MANUAL RESULT ENTRY): POC Glucose: 570 mg/dl — AB (ref 70–99)

## 2020-01-24 LAB — BETA-HYDROXYBUTYRIC ACID: Beta-Hydroxybutyric Acid: 4.72 mmol/L — ABNORMAL HIGH (ref 0.05–0.27)

## 2020-01-24 LAB — MRSA PCR SCREENING: MRSA by PCR: NEGATIVE

## 2020-01-24 MED ORDER — PANTOPRAZOLE SODIUM 40 MG PO TBEC
40.0000 mg | DELAYED_RELEASE_TABLET | Freq: Every day | ORAL | Status: DC
  Administered 2020-01-24 – 2020-01-26 (×3): 40 mg via ORAL
  Filled 2020-01-24 (×3): qty 1

## 2020-01-24 MED ORDER — GABAPENTIN 300 MG PO CAPS
600.0000 mg | ORAL_CAPSULE | Freq: Three times a day (TID) | ORAL | Status: DC
  Administered 2020-01-24 – 2020-01-26 (×6): 600 mg via ORAL
  Filled 2020-01-24 (×7): qty 2

## 2020-01-24 MED ORDER — DEXTROSE 50 % IV SOLN: 0.0000 mL | INTRAVENOUS | Status: DC | PRN

## 2020-01-24 MED ORDER — SODIUM CHLORIDE 0.9 % IV BOLUS
1000.0000 mL | INTRAVENOUS | Status: AC
  Administered 2020-01-24 (×2): 1000 mL via INTRAVENOUS

## 2020-01-24 MED ORDER — CARVEDILOL 6.25 MG PO TABS
6.2500 mg | ORAL_TABLET | Freq: Two times a day (BID) | ORAL | Status: DC
  Administered 2020-01-24 – 2020-01-26 (×4): 6.25 mg via ORAL
  Filled 2020-01-24 (×4): qty 1

## 2020-01-24 MED ORDER — ADULT MULTIVITAMIN W/MINERALS CH
1.0000 | ORAL_TABLET | Freq: Every day | ORAL | Status: DC
  Administered 2020-01-24 – 2020-01-26 (×3): 1 via ORAL
  Filled 2020-01-24 (×3): qty 1

## 2020-01-24 MED ORDER — SODIUM CHLORIDE 0.9 % IV SOLN
INTRAVENOUS | Status: DC
  Administered 2020-01-24: 75 mL/h via INTRAVENOUS

## 2020-01-24 MED ORDER — NOVOFINE 32G X 6 MM MISC: 1 refills | Status: DC

## 2020-01-24 MED ORDER — ASPIRIN EC 81 MG PO TBEC
81.0000 mg | DELAYED_RELEASE_TABLET | Freq: Every day | ORAL | Status: DC
  Administered 2020-01-25 – 2020-01-26 (×2): 81 mg via ORAL
  Filled 2020-01-24 (×2): qty 1

## 2020-01-24 MED ORDER — LATANOPROST 0.005 % OP SOLN
1.0000 [drp] | Freq: Every day | OPHTHALMIC | Status: DC
  Administered 2020-01-24 – 2020-01-25 (×3): 1 [drp] via OPHTHALMIC
  Filled 2020-01-24: qty 2.5

## 2020-01-24 MED ORDER — INSULIN REGULAR(HUMAN) IN NACL 100-0.9 UT/100ML-% IV SOLN
INTRAVENOUS | Status: DC
  Administered 2020-01-24: 13 [IU]/h via INTRAVENOUS
  Administered 2020-01-25: 1.8 [IU]/h via INTRAVENOUS
  Filled 2020-01-24 (×2): qty 100

## 2020-01-24 MED ORDER — ENOXAPARIN SODIUM 40 MG/0.4ML ~~LOC~~ SOLN
40.0000 mg | SUBCUTANEOUS | Status: DC
  Administered 2020-01-24 – 2020-01-25 (×2): 40 mg via SUBCUTANEOUS
  Filled 2020-01-24 (×2): qty 0.4

## 2020-01-24 MED ORDER — DORZOLAMIDE HCL-TIMOLOL MAL 2-0.5 % OP SOLN
1.0000 [drp] | Freq: Two times a day (BID) | OPHTHALMIC | Status: DC
  Administered 2020-01-24 – 2020-01-26 (×4): 1 [drp] via OPHTHALMIC
  Filled 2020-01-24: qty 10

## 2020-01-24 MED ORDER — SODIUM CHLORIDE 0.9 % IV BOLUS
1000.0000 mL | Freq: Once | INTRAVENOUS | Status: AC
  Administered 2020-01-24: 1000 mL via INTRAVENOUS

## 2020-01-24 MED ORDER — DEXTROSE-NACL 5-0.45 % IV SOLN: INTRAVENOUS | Status: DC

## 2020-01-24 NOTE — ED Triage Notes (Signed)
Pt sent due to hyperglycemia. Pt sent from doctor's office today. Pt reports dry mouth. Pt alert and oriented X4, cooperative, RR even and unlabored, color WNL. Pt in NAD.

## 2020-01-24 NOTE — ED Provider Notes (Signed)
[facility] Emergency Department Provider Note  ____________________________________________  Time seen: Approximately 2:42 PM  I have reviewed the triage vital signs and the nursing notes.   HISTORY  Chief Complaint Hyperglycemia    HPI Craig [name] is a 74 y.o. male with a history of CHF, GERD, hypertension, recent diagnosis of pancreatic cancer metastatic to his liver, recently started chemotherapy under supervision of oncology Dr. Tasia Catchings who has developed hyperglycemia over the past week.  He was just started on insulin due to inadequate control with Januvia, but continued to be hyperglycemic.  He also endorses polyuria and polydipsia and fatigue, worsening over the past 3 days.  Denies syncope or trauma, no fever.  Symptoms are constant without aggravating or alleviating factors.      Past Medical History:  Diagnosis Date  . Allergic rhinitis   . Cancer [facility])    new dx Pancreatic Cancer - Aug 2020  . CHF (congestive heart failure) ([place])   . Chronic cholecystitis   . Coronary artery disease   . DDD (degenerative disc disease), cervical   . Dehydration 06/21/2019  . Diabetes mellitus without complication ([place])   . Dyspnea   . Early cataract   . Erectile dysfunction   . GERD (gastroesophageal reflux disease)   . Gouty arthritis   . Headache    occasional migraines  . Hypercalcemia   . Hyperlipemia   . Hypertension   . Palpitations   . Peripheral vascular disease ([place])   . 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 Spinal stenosis of cervical region   . Vitamin D deficiency      Patient Active Problem List   Diagnosis Date Noted  . Fatigue 10/03/2019  . Low-level of literacy 06/29/2019  . Abnormal LFTs 06/29/2019  . AKI (acute kidney injury) ([place]) 06/29/2019  . Diarrhea 06/29/2019  . Orthostatic hypotension 06/29/2019  . Hyperglycemia 06/29/2019  . Anemia due  to antineoplastic chemotherapy 06/29/2019  . Dehydration 06/21/2019  . Encounter for antineoplastic chemotherapy 06/21/2019  . Normocytic anemia 06/21/2019  . Port-A-Cath in place 06/07/2019  . Goals of care, counseling/discussion 06/01/2019  . Pancreatic cancer metastasized to liver ([place]) 06/01/2019  . Liver metastasis ([place]) 06/01/2019  . CAD (coronary artery disease) 05/17/2019  . Personal history of tobacco use, presenting hazards to health 05/10/2019  . Peripheral arterial disease ([place]) 05/09/2019  . Prediabetes 05/01/2019  . LVH (left ventricular hypertrophy) due to hypertensive disease, without heart failure 01/04/2018  . 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 Postoperative anemia   . Coronary artery disease involving native coronary artery of native heart   . Coronary artery disease involving native coronary artery of native heart 09/16/2017  . Tobacco abuse 08/11/2017  . Spondylolisthesis, lumbar region 03/16/2017  . Hyperlipemia, mixed 09/10/2015  . 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 Essential (primary) hypertension 09/10/2015  . Degeneration of cervical intervertebral disc 09/10/2015  . Allergic rhinitis 09/10/2015  . GERD (gastroesophageal reflux disease) 06/25/2015  . Cervical stenosis of spinal canal 06/25/2015  . Atherosclerosis of native arteries of extremity with intermittent claudication ([place]) 06/25/2015     Past Surgical History:  Procedure Laterality Date  . BACK SURGERY  2018    fusion with screws  . CHOLECYSTECTOMY N/A 11/13/2018   Procedure: LAPAROSCOPIC CHOLECYSTECTOMY;  Surgeon: Vickie Epley, MD;  Location: [facility] ORS;  Service: General;  Laterality: N/A;  . COLONOSCOPY    .  CORONARY ARTERY BYPASS GRAFT N/A 09/30/2017   Procedure: CORONARY ARTERY BYPASS GRAFTING times five using right and left Saphaneous vein harvested endoscopicly  and left internal mammary artery. (CABG),TEE;  Surgeon: Rexene Alberts, MD;  Location: [place];  Service: Open Heart Surgery;   Laterality: N/A;  . LEFT HEART CATH AND CORONARY ANGIOGRAPHY Left 09/06/2017   Procedure: LEFT HEART CATH AND CORONARY ANGIOGRAPHY;  Surgeon: Corey Skains, MD;  Location: [place] [facility];  Service: Cardiovascular;  Laterality: Left;  . percutaneous transluminal balloon angioplasty  01/2010   of left lower extremity  . PORTA CATH INSERTION N/A 06/06/2019   Procedure: PORTA CATH INSERTION;  Surgeon: Katha Cabal, MD;  Location: [place] CV [facility];  Service: Cardiovascular;  Laterality: N/A;  . TEE WITHOUT CARDIOVERSION N/A 09/30/2017   Procedure: TRANSESOPHAGEAL ECHOCARDIOGRAM (TEE);  Surgeon: Rexene Alberts, MD;  Location: [place];  Service: Open Heart Surgery;  Laterality: N/A;  . VASCULAR SURGERY Left 2010   left exernal iliac and superficial femoral artery PTCA and stenting     Prior to Admission medications   Medication Sig Start Date End Date Taking? Authorizing Provider  aspirin EC 81 MG tablet Take 81 mg by mouth daily.    [provider]  carvedilol (COREG) 6.25 MG tablet Take 6.25 mg by mouth 2 (two) times daily with a meal. 03/01/18   [provider]  diltiazem (CARDIZEM CD) 300 MG 24 hr capsule TAKE 1 CAPSULE BY MOUTH EVERY DAY 10/20/19   Carles Collet M, PA-C  dorzolamide-timolol (COSOPT) 22.3-6.8 MG/ML ophthalmic solution INSTILL ONE DROP INTO BOTH EYES TWICE DAILY 11/08/18   [provider]  ferrous sulfate 325 (65 FE) MG tablet TAKE 1 TABLET BY MOUTH EVERY DAY WITH BREAKFAST 12/09/19   Carles Collet M, PA-C  gabapentin (NEURONTIN) 300 MG capsule TAKE 2 CAPSULES (600 MG TOTAL) BY MOUTH 3 (THREE) TIMES DAILY. 01/14/20   Trinna Post, PA-C  ibuprofen (ADVIL) 800 MG tablet Take 1 tablet (800 mg total) by mouth every 8 (eight) hours as needed. 10/15/19   Earlie Server, MD  insulin glargine (LANTUS SOLOSTAR) 100 UNIT/ML Solostar Pen Inject 10 Units into the skin at bedtime. 01/22/20   Trinna Post, PA-C  Insulin Pen Needle (NOVOFINE) 32G X 6  MM MISC Use to inject basal insulin once before bed. 01/24/20   Trinna Post, PA-C  latanoprost (XALATAN) 0.005 % ophthalmic solution Place 1 drop into both eyes at bedtime.  07/15/18   [provider]  lidocaine-prilocaine (EMLA) cream APPLY TO AFFECTED AREA ONCE AS DIRECTED 08/24/19   [provider]  loperamide (IMODIUM A-D) 2 MG tablet Take 1 tablet (2 mg total) by mouth See admin instructions. Take 2 at diarrhea onset , then 1 every 2hr until 12hrs with no BM. May take 2 every 4hrs at night. If diarrhea recurs repeat. 01/02/20   Earlie Server, MD  Multiple Vitamin (MULTIVITAMIN WITH MINERALS) TABS tablet Take 1 tablet by mouth daily.    [provider]  OMEGA-3 FATTY ACIDS PO Take 1,200 mg by mouth daily.     [provider]  omeprazole (PRILOSEC) 40 MG capsule TAKE 1 CAPSULE BY MOUTH EVERY DAY 12/01/19   Trinna Post, PA-C  oxyCODONE (OXY IR/ROXICODONE) 5 MG immediate release tablet Take 1 tablet (5 mg total) by mouth every 6 (six) hours as needed for severe pain. 01/15/20   Earlie Server, MD  sitaGLIPtin (JANUVIA) 100 MG tablet Take 100 mg by mouth  daily.    [provider]     Allergies Lipitor [atorvastatin], Zetia [ezetimibe], Lisinopril, Protonix [pantoprazole], and Spironolactone   Family History  Problem Relation Age of Onset  . Brain cancer Mother   . Emphysema Father   . Cancer Brother     Social History Social History   Tobacco Use  . Smoking status: Former Smoker    Packs/day: 0.75    Years: 50.00    Pack years: 37.50    Types: Cigarettes    Quit date: 09/27/2017    Years since quitting: 2.3  . Smokeless tobacco: Former Systems developer    Types: Chew  Substance Use Topics  . Alcohol use: Not Currently    Comment: stopped drinking before cabg  . Drug use: Not Currently    Types: Marijuana    Comment: stopped smoking before CABG    Review of Systems  Constitutional:   No fever or chills.  ENT:   No sore throat. No  rhinorrhea. Cardiovascular:   No chest pain or syncope. Respiratory:   No dyspnea or cough. Gastrointestinal:   Negative for abdominal pain, vomiting and diarrhea.  Musculoskeletal:   Negative for focal pain or swelling All other systems reviewed and are negative except as documented above in ROS and HPI.  ____________________________________________   PHYSICAL EXAM:  VITAL SIGNS: ED Triage Vitals  Enc Vitals Group     BP 01/24/20 1302 125/75     Pulse Rate 01/24/20 1302 78     Resp 01/24/20 1302 18     Temp 01/24/20 1302 97.8 F (36.6 C)     Temp Source 01/24/20 1302 Oral     SpO2 01/24/20 1302 100 %     Weight 01/24/20 1300 170 lb 14.4 oz (77.5 kg)     Height 01/24/20 1300 5\' 9"  (1.753 m)     Head Circumference --      Peak Flow --      Pain Score 01/24/20 1300 0     Pain Loc --      Pain Edu? --      Excl. in [place]? --     Vital signs reviewed, nursing assessments reviewed.   Constitutional:   Alert and oriented. Non-toxic appearance. Eyes:   Conjunctivae are normal. EOMI. PERRL. ENT      Head:   Normocephalic and atraumatic.      Nose:   Normal      Mouth/Throat:   Dry mucous membranes.      Neck:   No meningismus. Full ROM. Hematological/Lymphatic/Immunilogical:   No cervical lymphadenopathy. Cardiovascular:   RRR. Symmetric bilateral radial and DP pulses.  No murmurs. Cap refill less than 2 seconds. Respiratory:   Normal respiratory effort without tachypnea/retractions. Breath sounds are clear and equal bilaterally. No wheezes/rales/rhonchi. Gastrointestinal:   Soft and nontender. Non distended. There is no CVA tenderness.  No rebound, rigidity, or guarding. Musculoskeletal:   Normal range of motion in all extremities. No joint effusions.  No lower extremity tenderness.  No edema. Neurologic:   Normal speech and language.  Motor grossly intact. No acute focal neurologic deficits are appreciated.  Skin:    Skin is warm, dry and intact. No rash noted.  No  petechiae, purpura, or bullae.  ____________________________________________    LABS (pertinent positives/negatives) (all labs ordered are listed, but only abnormal results are displayed) Labs Reviewed  BASIC METABOLIC PANEL - Abnormal; Notable for the following components:      Result Value   Sodium 128 (*)  Potassium 5.3 (*)    Chloride 93 (*)    CO2 16 (*)    Glucose, Bld 649 (*)    BUN 57 (*)    Creatinine, Ser 1.43 (*)    GFR calc non Af Amer 48 (*)    GFR calc Af Amer 56 (*)    Anion gap 19 (*)    All other components within normal limits  CBC - Abnormal; Notable for the following components:   RBC 3.94 (*)    Hemoglobin 11.3 (*)    HCT 35.5 (*)    All other components within normal limits  GLUCOSE, CAPILLARY - Abnormal; Notable for the following components:   Glucose-Capillary >600 (*)    All other components within normal limits  RESPIRATORY PANEL BY RT PCR (FLU A&B, COVID)  URINALYSIS, COMPLETE (UACMP) WITH MICROSCOPIC  URINALYSIS, COMPLETE (UACMP) WITH MICROSCOPIC  BETA-HYDROXYBUTYRIC ACID  CBG MONITORING, ED  CBG MONITORING, ED  CBG MONITORING, ED  CBG MONITORING, ED   ____________________________________________   EKG    ____________________________________________    RADIOLOGY  No results found.  ____________________________________________   PROCEDURES .Critical Care Performed by: Carrie Mew, MD Authorized by: Carrie Mew, MD   Critical care provider statement:    Critical care time (minutes):  35   Critical care time was exclusive of:  Separately billable procedures and treating other patients   Critical care was necessary to treat or prevent imminent or life-threatening deterioration of the following conditions:  Metabolic crisis, endocrine crisis and dehydration   Critical care was time spent personally by me on the following activities:  Development of treatment plan with patient or surrogate, discussions with  consultants, evaluation of patient's response to treatment, examination of patient, obtaining history from patient or surrogate, ordering and performing treatments and interventions, ordering and review of laboratory studies, ordering and review of radiographic studies, pulse oximetry, re-evaluation of patient's condition and review of old charts    ____________________________________________    CLINICAL IMPRESSION / [place] / ED COURSE  Medications ordered in the ED: Medications  insulin regular, human (MYXREDLIN) 100 units/ 100 mL infusion (has no administration in time range)  0.9 %  sodium chloride infusion (has no administration in time range)  dextrose 5 %-0.45 % sodium chloride infusion (has no administration in time range)  dextrose 50 % solution 0-50 mL (has no administration in time range)  sodium chloride 0.9 % bolus 1,000 mL (has no administration in time range)  sodium chloride 0.9 % bolus 1,000 mL (1,000 mLs Intravenous New Bag/Given 01/24/20 1324)    Pertinent labs & imaging results that were available during my care of the patient were reviewed by me and considered in my medical decision making (see chart for details).  Craig [name] was evaluated in Emergency Department on 01/24/2020 for the symptoms described in the history of present illness. He was evaluated in the context of the global COVID-19 pandemic, which necessitated consideration that the patient might be at risk for infection with the SARS-CoV-2 virus that causes COVID-19. Institutional protocols and algorithms that pertain to the evaluation of patients at risk for COVID-19 are in a state of rapid change based on information released by regulatory bodies including the CDC and federal and state organizations. These policies and algorithms were followed during the patient's care in the ED.   Electronic medical record reviewed including recent progress note from oncology and primary care, lab results  from UA showing ketones obtained by PCP.  Clinical Course as  of Jan 23 1441  Thu Jan 24, 2020  1431 Pt p/w hyperglycemia, metabolic acidosis. UA obtained by PCP shows large ketones. C/w DKA. Will start IV insulin, 2L IVF for rehydration / resuscitation. Will need to admit for further management.   [PS]    Clinical Course User Index [PS] Carrie Mew, MD     ____________________________________________   FINAL CLINICAL IMPRESSION(S) / ED DIAGNOSES    Final diagnoses:  Diabetic ketoacidosis without coma associated with diabetes mellitus due to underlying condition [facility])  Dehydration  Malignant neoplasm of pancreas, unspecified location of malignancy [facility])     ED Discharge Orders    None      Portions of this note were generated with dragon dictation software. Dictation errors may occur despite best attempts at proofreading.   Carrie Mew, MD 01/24/20 1444

## 2020-01-24 NOTE — Progress Notes (Signed)
Established patient visit     Patient: Craig [name]   DOB: 11-01-1945   74 y.o. Male  MRN: [id number] Visit Date: 01/24/2020  Today's healthcare provider: Trinna Post, PA-C  Subjective:   No chief complaint on file.  HPI Diabetes Mellitus Type II, Follow-up  Lab Results  Component Value Date   HGBA1C 6.7 (H) 12/31/2019   HGBA1C 6.0 (H) 04/27/2019   HGBA1C 5.6 09/27/2017   Last seen for diabetes 2 days ago.  Management since then includes starting Lantus 10 units QHS. Previously he has been taking januvia 100 mg QD. He has metastatic pancreatic cancer. His C-peptide is still elevated and he is still making insulin.   Previously, he has seen the ER in the past week for hyperglycemia. He had no ketonuria or anion gap. He was given fluids and small amounts of novolog.  He reports excellent compliance with treatment. He is not having side effects.  Symptoms: Yes fatigue No foot ulcerations Yes appetite changes Yes nausea Yes paresthesia (numbness or tingling) of the feet  No polydipsia (excessive thirst) No polyuria (frequent urination) No visual disturbances  No vomiting  Home blood sugar records: fasting range: 400s  Episodes of hypoglycemia? No    Current insulin regiment: Lantus 10 units QHS Most Recent Eye Exam: Not UTD Current exercise: gardening Current diet habits: in general, a "healthy" diet    Pertinent Labs:    Component Value Date/Time   CHOL 180 04/27/2019 0940   TRIG 149 04/27/2019 0940   HDL 47 04/27/2019 0940   LDLCALC 103 (H) 04/27/2019 0940   NA 134 (L) 01/21/2020 0842   NA 139 05/15/2019 1111   K 4.1 01/21/2020 0842   ALT 66 (H) 01/21/2020 0842   AST 37 01/21/2020 0842   CREATININE 0.86 01/21/2020 0842   CREATININE 0.95 06/17/2017 1035   Wt Readings from Last 3 Encounters:  01/21/20 170 lb 14.4 oz (77.5 kg)  01/20/20 174 lb (78.9 kg)  01/17/20 174 lb 3.2 oz (79 kg)     -----------------------------------------------------------------------------------------      Medications: Outpatient Medications Prior to Visit  Medication Sig  . aspirin EC 81 MG tablet Take 81 mg by mouth daily.  . carvedilol (COREG) 6.25 MG tablet Take 6.25 mg by mouth 2 (two) times daily with a meal.  . diltiazem (CARDIZEM CD) 300 MG 24 hr capsule TAKE 1 CAPSULE BY MOUTH EVERY DAY  . dorzolamide-timolol (COSOPT) 22.3-6.8 MG/ML ophthalmic solution INSTILL ONE DROP INTO BOTH EYES TWICE DAILY  . ferrous sulfate 325 (65 FE) MG tablet TAKE 1 TABLET BY MOUTH EVERY DAY WITH BREAKFAST  . gabapentin (NEURONTIN) 300 MG capsule TAKE 2 CAPSULES (600 MG TOTAL) BY MOUTH 3 (THREE) TIMES DAILY.  [name] Kitchen ibuprofen (ADVIL) 800 MG tablet Take 1 tablet (800 mg total) by mouth every 8 (eight) hours as needed.  . insulin glargine (LANTUS SOLOSTAR) 100 UNIT/ML Solostar Pen Inject 10 Units into the skin at bedtime.  [name] Kitchen latanoprost (XALATAN) 0.005 % ophthalmic solution Place 1 drop into both eyes at bedtime.   . lidocaine-prilocaine (EMLA) cream APPLY TO AFFECTED AREA ONCE AS DIRECTED  . loperamide (IMODIUM A-D) 2 MG tablet Take 1 tablet (2 mg total) by mouth See admin instructions. Take 2 at diarrhea onset , then 1 every 2hr until 12hrs with no BM. May take 2 every 4hrs at night. If diarrhea recurs repeat.  . Multiple Vitamin (MULTIVITAMIN WITH MINERALS) TABS tablet Take 1 tablet by mouth daily.  . OMEGA-3 FATTY  ACIDS PO Take 1,200 mg by mouth daily.   [name] Kitchen omeprazole (PRILOSEC) 40 MG capsule TAKE 1 CAPSULE BY MOUTH EVERY DAY  . oxyCODONE (OXY IR/ROXICODONE) 5 MG immediate release tablet Take 1 tablet (5 mg total) by mouth every 6 (six) hours as needed for severe pain.  . sitaGLIPtin (JANUVIA) 100 MG tablet Take 100 mg by mouth daily.   Facility-Administered Medications Prior to Visit  Medication Dose Route Frequency Provider  . sodium chloride flush (NS) 0.9 % injection 10 mL  10 mL Intracatheter PRN Earlie Server,  MD  . sodium chloride flush (NS) 0.9 % injection 10 mL  10 mL Intravenous Once Earlie Server, MD    Review of Systems  12 point ROS normal except HPI     Objective:    There were no vitals taken for this visit.   Physical Exam Constitutional:      Appearance: Normal appearance.  Cardiovascular:     Rate and Rhythm: Normal rate and regular rhythm.     Heart sounds: Normal heart sounds.  Pulmonary:     Effort: Pulmonary effort is normal.     Breath sounds: Normal breath sounds.  Skin:    General: Skin is warm and dry.  Neurological:     Mental Status: He is alert and oriented to person, place, and time. Mental status is at baseline.  Psychiatric:        Mood and Affect: Mood normal.        Behavior: Behavior normal.       Results for orders placed or performed in visit on 01/24/20  POCT Glucose (CBG)  Result Value Ref Range   POC Glucose 570 (A) 70 - 99 mg/dl  POCT urinalysis dipstick  Result Value Ref Range   Color, UA yellow    Clarity, UA clear    Glucose, UA Positive (A) Negative   Bilirubin, UA neg    Ketones, UA 60    Spec Grav, UA 1.020 1.010 - 1.025   Blood, UA neg    pH, UA 6.0 5.0 - 8.0   Protein, UA Negative Negative   Urobilinogen, UA 0.2 0.2 or 1.0 E.U./dL   Nitrite, UA neg    Leukocytes, UA Negative Negative   Appearance     Odor        Assessment & Plan:    1. Diabetes mellitus without complication (HCC)  Previously seen in ER for hyperglycemia without signs or symptoms of DKA. Today however POCT glucose is 570 and he has ketones in his urine. Will send him to the ER for further lab evaluation, fluids, and insulin. I have called ahead. I will call his wife to explain how to titrate insulin.   - Urinalysis - POCT Glucose (CBG) - POCT urinalysis dipstick  2. Hyperglycemia   3. Ketonuria     No follow-ups on file.        Paulene Floor  [facility] [phone] (phone) [phone] (fax)  [place]

## 2020-01-24 NOTE — H&P (Addendum)
History and Physical    Craig [name]  [id number]  DOB: 1946-06-16  DOA: 01/24/2020 PCP: Trinna Post, PA-C   Patient coming from: home  Chief Complaint: weakness  HPI: Craig [name] is a 74 y.o. male with medical history of DM,  Pancreatic cancer with metastasis to the liver undergoing chemo, CAD s.p CABG x 5, neuropathy, HTN presents to the ED for weakness. He states he has been coming to the ED repeated for the same complaint this week. Today he is found to have DKA. He was diagnosed with DM recently and start on Lantus, the first dose of which he took last night. He states he has limited eating sugary, carb rich foods, drinks a lot of water and urinates a lot. He states he is not eating much because his mouth is dry and he drinks too much water to get food down. He has been drinking 2 bottles of diabetic boost a day. He routinely received chemo and last received his chemo pump on Monday and it was removed yesterday.  ED Course: Glucose 649, CO2 16, B OH butyric acid 4.72- given 2 L NS and started on Insulin infusion   Review of Systems:   Weight loss - about 4 lbs. Has tingling in toes.  All other systems reviewed and apart from HPI, are negative.  Past Medical History:  Diagnosis Date  . Allergic rhinitis   . Cancer [facility])    new dx Pancreatic Cancer - Aug 2020  . CHF (congestive heart failure) ([place])   . Chronic cholecystitis   . Coronary artery disease   . DDD (degenerative disc disease), cervical   . Dehydration 06/21/2019  . Diabetes mellitus without complication ([place])   . Dyspnea   . Early cataract   . Erectile dysfunction   . GERD (gastroesophageal reflux disease)   . Gouty arthritis   . Headache    occasional migraines  . Hypercalcemia   . Hyperlipemia   . Hypertension   . Palpitations   . Peripheral vascular disease ([place])   . 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   LIMA to LAD SVG to [place] SVG SEQUENTIALLY to OM1 and OM2 SVG to ACUTE MARGINAL  .  Spinal stenosis of cervical region   . Vitamin D deficiency     Past Surgical History:  Procedure Laterality Date  . BACK SURGERY  2018    fusion with screws  . CHOLECYSTECTOMY N/A 11/13/2018   Procedure: LAPAROSCOPIC CHOLECYSTECTOMY;  Surgeon: Vickie Epley, MD;  Location: [facility];  Service: General;  Laterality: N/A;  . COLONOSCOPY    . CORONARY ARTERY BYPASS GRAFT N/A 09/30/2017   Procedure: CORONARY ARTERY BYPASS GRAFTING times five using right and left Saphaneous vein harvested endoscopicly  and left internal mammary artery. (CABG),TEE;  Surgeon: Rexene Alberts, MD;  Location: [place];  Service: Open Heart Surgery;  Laterality: N/A;  . LEFT HEART CATH AND CORONARY ANGIOGRAPHY Left 09/06/2017   Procedure: LEFT HEART CATH AND CORONARY ANGIOGRAPHY;  Surgeon: Corey Skains, MD;  Location: [place] [facility];  Service: Cardiovascular;  Laterality: Left;  . percutaneous transluminal balloon angioplasty  01/2010   of left lower extremity  . PORTA CATH INSERTION N/A 06/06/2019   Procedure: PORTA CATH INSERTION;  Surgeon: Katha Cabal, MD;  Location: [place] [facility];  Service: Cardiovascular;  Laterality: N/A;  . TEE WITHOUT CARDIOVERSION N/A 09/30/2017   Procedure: TRANSESOPHAGEAL ECHOCARDIOGRAM (TEE);  Surgeon: Rexene Alberts, MD;  Location: [place];  Service: Open Heart Surgery;  Laterality: N/A;  . VASCULAR SURGERY Left 2010   left exernal iliac and superficial femoral artery PTCA and stenting    Social History:  Lives with wife.He reports that he quit smoking about 2-3 years ago. His smoking use included cigarettes. He has quit using smokeless tobacco.  His smokeless tobacco use included chew. He reports previous alcohol use. He reports previous drug use. Drug: Marijuana.  Allergies  Allergen Reactions  . Lipitor [Atorvastatin] Other (See Comments)    MYALGIA  . Zetia [Ezetimibe] Other (See Comments)    MYALGIA  . Lisinopril Cough  . Protonix [Pantoprazole]  Other (See Comments)    headache  . Spironolactone Other (See Comments)    Breast tenderness    Family History  Problem Relation Age of Onset  . Brain cancer Mother   . Emphysema Father   . Cancer Brother      Prior to Admission medications   Medication Sig Start Date End Date Taking? Authorizing Provider  aspirin EC 81 MG tablet Take 81 mg by mouth daily.    [provider]  carvedilol (COREG) 6.25 MG tablet Take 6.25 mg by mouth 2 (two) times daily with a meal. 03/01/18   [provider]  diltiazem (CARDIZEM CD) 300 MG 24 hr capsule TAKE 1 CAPSULE BY MOUTH EVERY DAY 10/20/19   Carles Collet M, PA-C  dorzolamide-timolol (COSOPT) 22.3-6.8 MG/ML ophthalmic solution INSTILL ONE DROP INTO BOTH EYES TWICE DAILY 11/08/18   [provider]  ferrous sulfate 325 (65 FE) MG tablet TAKE 1 TABLET BY MOUTH EVERY DAY WITH BREAKFAST 12/09/19   Carles Collet M, PA-C  gabapentin (NEURONTIN) 300 MG capsule TAKE 2 CAPSULES (600 MG TOTAL) BY MOUTH 3 (THREE) TIMES DAILY. 01/14/20   Trinna Post, PA-C  ibuprofen (ADVIL) 800 MG tablet Take 1 tablet (800 mg total) by mouth every 8 (eight) hours as needed. 10/15/19   Earlie Server, MD  insulin glargine (LANTUS SOLOSTAR) 100 UNIT/ML Solostar Pen Inject 10 Units into the skin at bedtime. 01/22/20   Trinna Post, PA-C  Insulin Pen Needle (NOVOFINE) 32G X 6 MM MISC Use to inject basal insulin once before bed. 01/24/20   Trinna Post, PA-C  latanoprost (XALATAN) 0.005 % ophthalmic solution Place 1 drop into both eyes at bedtime.  07/15/18   [provider]  lidocaine-prilocaine (EMLA) cream APPLY TO AFFECTED AREA ONCE AS DIRECTED 08/24/19   [provider]  loperamide (IMODIUM A-D) 2 MG tablet Take 1 tablet (2 mg total) by mouth See admin instructions. Take 2 at diarrhea onset , then 1 every 2hr until 12hrs with no BM. May take 2 every 4hrs at night. If diarrhea recurs repeat. 01/02/20   Earlie Server, MD  Multiple Vitamin  (MULTIVITAMIN WITH MINERALS) TABS tablet Take 1 tablet by mouth daily.    [provider]  OMEGA-3 FATTY ACIDS PO Take 1,200 mg by mouth daily.     [provider]  omeprazole (PRILOSEC) 40 MG capsule TAKE 1 CAPSULE BY MOUTH EVERY DAY 12/01/19   Trinna Post, PA-C  oxyCODONE (OXY IR/ROXICODONE) 5 MG immediate release tablet Take 1 tablet (5 mg total) by mouth every 6 (six) hours as needed for severe pain. 01/15/20   Earlie Server, MD  sitaGLIPtin (JANUVIA) 100 MG tablet Take 100 mg by mouth daily.    [provider]    Physical Exam: Wt Readings from Last 3 Encounters:  01/24/20 74.2 kg  01/21/20 77.5 kg  01/20/20 78.9 kg   Vitals:   01/24/20 1302 01/24/20 1517 01/24/20 1626 01/24/20 1700  BP: 125/75 (!) 149/64  (!) 143/67  Pulse: 78 (!) 57  60  Resp: 18 15  15   Temp: 97.8 F (36.6 C)  97.6 F (36.4 C)   TempSrc: Oral  Oral   SpO2: 100% 100%  100%  Weight: 77 kg  74.2 kg   Height: 5\' 9"  (1.753 m)  5\' 9"  (1.753 m)       Constitutional:  Calm & comfortable Eyes: PERRLA, lids and conjunctivae normal ENT:  Mucous membranes are very dry Pharynx clear of exudate   Normal dentition.  Neck: Supple, no masses  Respiratory:  Clear to auscultation bilaterally  Normal respiratory effort.  Cardiovascular:  S1 & S2 heard, regular rate and rhythm No Murmurs Abdomen:  Non distended No tenderness, No masses Bowel sounds normal Extremities:  No clubbing / cyanosis No pedal edema No joint deformity    Skin:  No rashes, lesions or ulcers Neurologic:  AAO x 3 CN 2-12 grossly intact Sensation intact Strength 5/5 in all 4 extremities Psychiatric:  Normal Mood and affect    Labs on Admission: I have personally reviewed following labs and imaging studies  CBC: Recent Labs  Lab 01/20/20 1805 01/21/20 0842 01/24/20 1304  WBC 8.0 8.5 8.1  NEUTROABS  --  6.7  --   HGB 11.0* 11.0* 11.3*  HCT 34.3* 34.6* 35.5*  MCV 89.8 89.2 90.1  PLT 181 177 239     Basic Metabolic Panel: Recent Labs  Lab 01/20/20 1805 01/21/20 0842 01/24/20 1304  NA 128* 134* 128*  K 4.6 4.1 5.3*  CL 96* 101 93*  CO2 23 24 16*  GLUCOSE 651* 367* 649*  BUN 23 19 57*  CREATININE 1.05 0.86 1.43*  CALCIUM 9.1 9.1 9.4   GFR: Estimated Creatinine Clearance: 46 mL/min (A) (by C-G formula based on SCr of 1.43 mg/dL (H)). Liver Function Tests: Recent Labs  Lab 01/21/20 0842  AST 37  ALT 66*  ALKPHOS 90  BILITOT 1.0  PROT 6.9  ALBUMIN 3.5   No results for input(s): LIPASE, AMYLASE in the last 168 hours. No results for input(s): AMMONIA in the last 168 hours. Coagulation Profile: No results for input(s): INR, PROTIME in the last 168 hours. Cardiac Enzymes: No results for input(s): CKTOTAL, CKMB, CKMBINDEX, TROPONINI in the last 168 hours. BNP (last 3 results) No results for input(s): PROBNP in the last 8760 hours. HbA1C: No results for input(s): HGBA1C in the last 72 hours. CBG: Recent Labs  Lab 01/20/20 1859 01/20/20 2155 01/20/20 2317 01/24/20 1305 01/24/20 1511  GLUCAP 515* 394* 274* >600* 471*   Lipid Profile: No results for input(s): CHOL, HDL, LDLCALC, TRIG, CHOLHDL, LDLDIRECT in the last 72 hours. Thyroid Function Tests: No results for input(s): TSH, T4TOTAL, FREET4, T3FREE, THYROIDAB in the last 72 hours. Anemia Panel: No results for input(s): VITAMINB12, FOLATE, FERRITIN, TIBC, IRON, RETICCTPCT in the last 72 hours. Urine analysis:    Component Value Date/Time   COLORURINE STRAW (A) 01/20/2020 1814   APPEARANCEUR CLEAR (A) 01/20/2020 1814   LABSPEC 1.032 (H) 01/20/2020 1814   PHURINE 6.0 01/20/2020 1814   GLUCOSEU >=500 (A) 01/20/2020 1814   HGBUR NEGATIVE 01/20/2020 1814   BILIRUBINUR neg 01/24/2020 1223   [place] 01/20/2020 1814   PROTEINUR Negative 01/24/2020 1223   PROTEINUR NEGATIVE 01/20/2020 1814   UROBILINOGEN 0.2 01/24/2020 1223   NITRITE neg 01/24/2020 1223   NITRITE NEGATIVE  01/20/2020 1814    LEUKOCYTESUR Negative 01/24/2020 1223   LEUKOCYTESUR NEGATIVE 01/20/2020 1814   Sepsis Labs: @LABRCNTIP (procalcitonin:4,lacticidven:4) ) Recent Results (from the past 240 hour(s))  Respiratory Panel by RT PCR (Flu A&B, Covid) - Nasopharyngeal Swab     Status: None   Collection Time: 01/24/20  2:54 PM   Specimen: Nasopharyngeal Swab  Result Value Ref Range Status   SARS Coronavirus 2 by RT PCR NEGATIVE NEGATIVE Final    Comment: (NOTE) SARS-CoV-2 target nucleic acids are NOT DETECTED. The SARS-CoV-2 RNA is generally detectable in upper respiratoy specimens during the acute phase of infection. The lowest concentration of SARS-CoV-2 viral copies this assay can detect is 131 copies/mL. A negative result does not preclude SARS-Cov-2 infection and should not be used as the sole basis for treatment or other patient management decisions. A negative result may occur with  improper specimen collection/handling, submission of specimen other than nasopharyngeal swab, presence of viral mutation(s) within the areas targeted by this assay, and inadequate number of viral copies (<131 copies/mL). A negative result must be combined with clinical observations, patient history, and epidemiological information. The expected result is Negative. Fact Sheet for Patients:  PinkCheek.be Fact Sheet for Healthcare Providers:  GravelBags.it This test is not yet ap proved or cleared by the [place] FDA and  has been authorized for detection and/or diagnosis of SARS-CoV-2 by FDA under an Emergency Use Authorization (EUA). This EUA will remain  in effect (meaning this test can be used) for the duration of the COVID-19 declaration under Section 564(b)(1) of the Act, 21 U.S.C. section 360bbb-3(b)(1), unless the authorization is terminated or revoked sooner.    Influenza A by PCR NEGATIVE NEGATIVE Final   Influenza B by PCR NEGATIVE NEGATIVE Final     Comment: (NOTE) The Xpert Xpress SARS-CoV-2/FLU/RSV assay is intended as an aid in  the diagnosis of influenza from Nasopharyngeal swab specimens and  should not be used as a sole basis for treatment. Nasal washings and  aspirates are unacceptable for Xpert Xpress SARS-CoV-2/FLU/RSV  testing. Fact Sheet for Patients: PinkCheek.be Fact Sheet for Healthcare Providers: GravelBags.it This test is not yet approved or cleared by the [place] FDA and  has been authorized for detection and/or diagnosis of SARS-CoV-2 by  FDA under an Emergency Use Authorization (EUA). This EUA will remain  in effect (meaning this test can be used) for the duration of the  Covid-19 declaration under Section 564(b)(1) of the Act, 21  U.S.C. section 360bbb-3(b)(1), unless the authorization is  terminated or revoked. Performed at [facility], [address], [place], [place] [address]      Radiological Exams on Admission: No results found.    Assessment/Plan Principal Problem:   DKA (diabetic ketoacidoses) - interestingly last A1c was 6.7 in 3/22- will recheck - per oncology notes, he was hesitant to start insulin but finally agreed - he started taking insulin last night at home - cont insulin infusion and IVF - can give diabetic diet once sugars < 250 but non caloric clears for now  Active Problems: AKI - BUN 57, Cr 1.43 - likely prerenal- follow after hydration and correction of glucose  Hyponatremia- NA+ 128 - likely due to dehydration and high glucose - follow  Hyperkalemia- K 5.3 - mild- should improve with IVF and insulin- not obtaining EKG    Pancreatic cancer metastasized to liver [facility]) - ongoing management per DR Tasia Catchings with chemo5-FU and liposomal irinotecan    GERD (gastroesophageal reflux disease) - cont  Protonix    Hyperlipemia, mixed - hold statin for today    Essential (primary) hypertension - cont Coreg  with holding parameters - hold Cardizem today    Coronary artery disease involving native coronary artery of native heart - cont ASA- holding statin   Anemia - likely of chronic disease- follow      DVT prophylaxis: Lovenox  Code Status: DNR  Family Communication: wife at bedside  Disposition Plan: admit to SDU  Consults called: none  Admission status: inpatient- I feel it will take 2 days to hydrate him, improve his sugars and find the right dose of Insulin    Debbe Odea MD [facility] Pager: [email or web address] Password [email or web address] 7PM-7AM, please contact night-coverage   01/24/2020, 5:52 PM

## 2020-01-24 NOTE — Addendum Note (Signed)
Addended by: Trinna Post on: 01/24/2020 12:59 PM   Modules accepted: Orders

## 2020-01-24 NOTE — ED Triage Notes (Signed)
Pt sent from PCP , pt has pancreatic cancer and has been having issues controlling her blood glucose, 570 today. Recently changed insulin dose.

## 2020-01-24 NOTE — Patient Instructions (Addendum)
HOW TO ADJUST BASAL INSULIN:  Right now you are taking ten units of lantus at night. Every two days, I want you to increase the units by 2. For example, your next dose of insulin will be 12 units on 01/25/2020. Then on 01/27/2020 your dose of lantus will be 14 units. You will continue this until your fasting sugars are around 150 in the morning. We will see you back on 02/08/2020.

## 2020-01-25 LAB — BASIC METABOLIC PANEL
Anion gap: 7 (ref 5–15)
Anion gap: 8 (ref 5–15)
Anion gap: 8 (ref 5–15)
BUN: 34 mg/dL — ABNORMAL HIGH (ref 8–23)
BUN: 32 mg/dL — ABNORMAL HIGH (ref 8–23)
BUN: 33 mg/dL — ABNORMAL HIGH (ref 8–23)
CO2: 25 mmol/L (ref 22–32)
CO2: 23 mmol/L (ref 22–32)
CO2: 24 mmol/L (ref 22–32)
Calcium: 9.1 mg/dL (ref 8.9–10.3)
Calcium: 8.9 mg/dL (ref 8.9–10.3)
Calcium: 9 mg/dL (ref 8.9–10.3)
Chloride: 108 mmol/L (ref 98–111)
Chloride: 107 mmol/L (ref 98–111)
Chloride: 103 mmol/L (ref 98–111)
Creatinine, Ser: 0.91 mg/dL (ref 0.61–1.24)
Creatinine, Ser: 0.89 mg/dL (ref 0.61–1.24)
Creatinine, Ser: 0.92 mg/dL (ref 0.61–1.24)
GFR calc Af Amer: >60 mL/min (ref >60)
GFR calc Af Amer: >60 mL/min (ref >60)
GFR calc Af Amer: >60 mL/min (ref >60)
GFR calc non Af Amer: >60 mL/min (ref >60)
GFR calc non Af Amer: >60 mL/min (ref >60)
GFR calc non Af Amer: >60 mL/min (ref >60)
Glucose, Bld: 139 mg/dL — ABNORMAL HIGH (ref 70–99)
Glucose, Bld: 162 mg/dL — ABNORMAL HIGH (ref 70–99)
Glucose, Bld: 253 mg/dL — ABNORMAL HIGH (ref 70–99)
Potassium: 4.2 mmol/L (ref 3.5–5.1)
Potassium: 4 mmol/L (ref 3.5–5.1)
Potassium: 4.4 mmol/L (ref 3.5–5.1)
Sodium: 140 mmol/L (ref 135–145)
Sodium: 138 mmol/L (ref 135–145)
Sodium: 135 mmol/L (ref 135–145)

## 2020-01-25 LAB — GLUCOSE, CAPILLARY
Glucose-Capillary: 135 mg/dL — ABNORMAL HIGH (ref 70–99)
Glucose-Capillary: 135 mg/dL — ABNORMAL HIGH (ref 70–99)
Glucose-Capillary: 152 mg/dL — ABNORMAL HIGH (ref 70–99)
Glucose-Capillary: 153 mg/dL — ABNORMAL HIGH (ref 70–99)
Glucose-Capillary: 160 mg/dL — ABNORMAL HIGH (ref 70–99)
Glucose-Capillary: 166 mg/dL — ABNORMAL HIGH (ref 70–99)
Glucose-Capillary: 171 mg/dL — ABNORMAL HIGH (ref 70–99)
Glucose-Capillary: 239 mg/dL — ABNORMAL HIGH (ref 70–99)
Glucose-Capillary: 329 mg/dL — ABNORMAL HIGH (ref 70–99)
Glucose-Capillary: 356 mg/dL — ABNORMAL HIGH (ref 70–99)

## 2020-01-25 MED ORDER — CHLORHEXIDINE GLUCONATE CLOTH 2 % EX PADS
6.0000 | MEDICATED_PAD | Freq: Every day | CUTANEOUS | Status: DC
  Administered 2020-01-25 – 2020-01-26 (×2): 6 via TOPICAL

## 2020-01-25 MED ORDER — INSULIN STARTER KIT- PEN NEEDLES (ENGLISH)
1.0000 | Freq: Once | Status: AC
  Administered 2020-01-25: 1
  Filled 2020-01-25: qty 1

## 2020-01-25 MED ORDER — INSULIN GLARGINE 100 UNIT/ML ~~LOC~~ SOLN
10.0000 [IU] | Freq: Every evening | SUBCUTANEOUS | Status: DC
  Administered 2020-01-25: 10 [IU] via SUBCUTANEOUS
  Filled 2020-01-25 (×4): qty 0.1

## 2020-01-25 MED ORDER — LINAGLIPTIN 5 MG PO TABS
5.0000 mg | ORAL_TABLET | Freq: Every day | ORAL | Status: DC
  Administered 2020-01-25 – 2020-01-26 (×2): 5 mg via ORAL
  Filled 2020-01-25 (×2): qty 1

## 2020-01-25 MED ORDER — INSULIN ASPART 100 UNIT/ML ~~LOC~~ SOLN
0.0000 [IU] | Freq: Three times a day (TID) | SUBCUTANEOUS | Status: DC
Start: 2020-01-25 — End: 2020-01-26
  Administered 2020-01-25: 2 [IU] via SUBCUTANEOUS
  Administered 2020-01-25 – 2020-01-26 (×2): 5 [IU] via SUBCUTANEOUS
  Administered 2020-01-26: 3 [IU] via SUBCUTANEOUS
  Filled 2020-01-25 (×4): qty 1

## 2020-01-25 MED ORDER — DILTIAZEM HCL ER COATED BEADS 300 MG PO CP24
300.0000 mg | ORAL_CAPSULE | Freq: Every day | ORAL | Status: DC
  Administered 2020-01-25 – 2020-01-26 (×2): 300 mg via ORAL
  Filled 2020-01-25 (×2): qty 1

## 2020-01-25 MED ORDER — LIVING WELL WITH DIABETES BOOK
Freq: Once | Status: AC
  Filled 2020-01-25: qty 1

## 2020-01-25 MED ORDER — INSULIN ASPART 100 UNIT/ML ~~LOC~~ SOLN
2.0000 [IU] | Freq: Three times a day (TID) | SUBCUTANEOUS | Status: DC
  Administered 2020-01-25 – 2020-01-26 (×5): 2 [IU] via SUBCUTANEOUS
  Filled 2020-01-25 (×4): qty 1

## 2020-01-25 MED ORDER — SODIUM CHLORIDE 0.9 % IV SOLN: INTRAVENOUS | Status: DC

## 2020-01-25 MED ORDER — INSULIN ASPART 100 UNIT/ML ~~LOC~~ SOLN
0.0000 [IU] | Freq: Every day | SUBCUTANEOUS | Status: DC
  Administered 2020-01-25: 5 [IU] via SUBCUTANEOUS
  Filled 2020-01-25: qty 1

## 2020-01-25 MED ORDER — ENSURE MAX PROTEIN PO LIQD
11.0000 [oz_av] | Freq: Two times a day (BID) | ORAL | Status: DC
  Filled 2020-01-25: qty 330

## 2020-01-25 NOTE — Progress Notes (Addendum)
Inpatient Diabetes Program Recommendations  AACE/ADA: New Consensus Statement on Inpatient Glycemic Control   Target Ranges:  Prepandial:   less than 140 mg/dL      Peak postprandial:   less than 180 mg/dL (1-2 hours)      Critically ill patients:  140 - 180 mg/dL   Results for Craig [name], Craig [name] (MRN [id number]) as of 01/25/2020 08:55  Ref. Range 01/24/2020 23:49 01/25/2020 02:16 01/25/2020 03:31 01/25/2020 04:38 01/25/2020 05:47 01/25/2020 06:27 01/25/2020 07:35  Glucose-Capillary Latest Ref Range: 70 - 99 mg/dL 171 (H) 135 (H) 135 (H) 153 (H) 166 (H) 160 (H) 152 (H)  Results for Craig [name], Craig [name] (MRN [id number]) as of 01/25/2020 08:55  Ref. Range 01/07/2020 08:10 01/15/2020 13:26 01/15/2020 14:34 01/20/2020 18:05 01/21/2020 08:42 01/24/2020 13:04  Glucose Latest Ref Range: 70 - 99 mg/dL 403 (H) 565 (HH) 539 (HH) 651 (HH) 367 (H) 649 (HH)  Results for Craig [name], Craig [name] (MRN [id number]) as of 01/25/2020 08:55  Ref. Range 11/14/2019 08:34 11/19/2019 09:18 12/05/2019 12:55 12/31/2019 08:58  Glucose Latest Ref Range: 70 - 99 mg/dL 131 (H) 156 (H) 158 (H) 265 (H)   Results for Craig [name], Craig [name] (MRN [id number]) as of 01/25/2020 08:55  Ref. Range 12/31/2019 08:58 01/24/2020 13:04  Hemoglobin A1C Latest Ref Range: 4.8 - 5.6 % 6.7 (H) 10.7 (H)  Results for Craig [name], Craig [name] (MRN [id number]) as of 01/25/2020 08:55  Ref. Range 01/08/2020 14:32  C-Peptide Latest Ref Range: 1.1 - 4.4 ng/mL 6.4 (H)   Review of Glycemic Control  Diabetes history: DM2;hx of pancreatic cancer with liver metastasis   Outpatient Diabetes medications: Basaglar 10 units QHS (just started on 01/22/20 in the office and pt took at home on 01/23/20), Januvia 100 mg daily Current orders for Inpatient glycemic control: Lantus 10 units QPM, Novolog 2 units TID with meals, Novolog 0-9 units TID with meals, Novolog 0-5 units QHS  Inpatient Diabetes Program Recommendations:   Insulin - Basal: Noted patient received Lantus 10 units at 4:39 am today and ordered Lantus  10 units QPM currently.   Labs: May want to consider rechecking a C-Peptide.   HgbA1C: A1C 10.7% on 01/24/20 indicating an average glucose of 260 mg/dl. Prior A1C was 6.7% on 12/31/19. Question if Beryle Flock has caused patient to develop Type 1 DM or if pancreatic cancer impacting amount of insulin he is now able to produce. Would recommend using Lantus and Novolog insulin for DM control as an outpatient (would discontinue Januvia).  NOTE: In reviewing the chart, noted patient has pancreatic cancer with liver metastasis and was recently diagnosed with DM. Patient was started on Keytruda (immunotherapy medication) on 11/19/19 which can cause Type 1 diabetes. Patient was noted to have hyperglycemia on 12/31/19 and A1C was checked at that time which was 6.7%.  Glucose noted to be 403 mg/dl on 01/07/20 at office visit with oncology. Patient seen PCP on 01/08/20 and was prescribed Januvia at that time. Seen Oncology on 01/15/20 and per note, patient had not started on Januvia yet and glucose was 565 mg/dl so patient was asked to go to ER for evaluation. Patient went to ER on 01/15/20 and treated with fluids and insulin and discharged home and asked to follow up with PCP. Pateint seen PCP on 01/17/20 and was encouraged to continue Januvia (had only taken for 2 days) and follow up on 02/08/20. Patient went back to ER on 01/20/20 due to "HI" reading at home on glucometer and was again treated with fluids and insulin and asked to follow up with  PCP. Patient seen PCP on 01/22/20 and was started on Basaglar 10 units QHS (received first dose in the office on 01/22/20. Will follow up with patient today.  Addendum 01/25/20@14 :15-Spoke with patient and his wife at bedside regarding recently new DM dx and insulin. Patient confirms that he was recently dx with DM and PCP initially started him on Januvia and then Basaglar was prescribed on 01/22/20 by PCP. Patient states that he was educated on insulin and how to use an insulin pen by PCP. Had  patient verbalize steps of using an insulin pen and he was able to verbalize each step correctly. Patient's wife notes that she has diabetes as well and she had the Living Well with Diabetes book reading over it.   Discussed A1C results (10.7% on 01/24/20) and explained what an A1C is and informed patient that his current A1C indicates an average glucose of 260 mg/dl over the past 2-3 months. Explained that his A1C was 6.7% on 12/31/19 so it has increased significantly in the past few weeks. Explained that it is unclear if DM developed due to pancreatic cancer or potentially from a medication. Explained that per the notes in the chart, he was receiving Keytruda which has a risk of causing Type 1 DM.   Explained what a c-peptide is and explained that it was checked on 01/08/20 and it was high (6.4) at that time. Informed patient, it would be recommended to have it rechecked to see if his pancreas is still producing adequate amount of insulin.  Discussed basic pathophysiology of DM Type 1 and 2, basic home care, importance of checking CBGs and maintaining good CBG control to prevent long-term and short-term complications. Reviewed glucose and A1C goals.  Reviewed signs and symptoms of hyperglycemia and hypoglycemia along with treatment for both. Encouraged patient to get some glucose tablets to have on hand at all times.  Discussed impact of nutrition, exercise, stress, sickness, and medications on diabetes control.  Patient states that he has not had much of an appetite recently has been using Boost supplements. Patient is trying Ensure Max today and his wife asked about other supplement options to provide a variety to the patient when he is unable to eat.   Encouraged patient to read through entire book. Informed patient that he will likely need to take insulin for DM control and may need to take a short acting insulin along with the Basaglar and dosages may need to be adjusted.  Asked patient to check his glucose 4  times per day (before meals and at bedtime) and to keep a log book of glucose readings and insulin taken. Explained how the doctor he follows up with can use the glucose values to continue to make insulin adjustments if needed. Discussed FreeStyle Libre 2 with patient and encouraged him to ask PCP about prescribing if he is interested in using it.  Patient and his wife verbalized understanding of information discussed and he states that he has no further questions at this time related to diabetes.   Communicated with Deneen Harts, RD who seen patient earlier today regarding other supplement options. If patient does not like the Ensure Max he is trying today, he could try Ensure Plus/Ensure Enlive or Boost Plus.   Thanks, Barnie Alderman, RN, MSN, CDE Diabetes Coordinator Inpatient Diabetes Program [phone] (Team Pager from 8am to 5pm)

## 2020-01-25 NOTE — Chronic Care Management (AMB) (Signed)
  Chronic Care Management   Note for 01/24/20     Name: Brinson Gutowski MRN: [id number] DOB: [birth year]   Brief outreach with Mr. Biondolillo. He reports being in route to the Emergency Department at the time of the call. Reports a blood glucose level of 400mg /dl shortly after waking up. Reports taking insulin as recommended and rechecking levels. States most recent reading was 500mg /dl. Notified Mr. Thalheimer primary care provider (PCP).   Follow-Up: Received message from Mr. Lestage PCP. Indicated that he did not report to the Emergency Department as initially planned but reported to the clinic for an acute visit. Evaluation pending.  PLAN The care management team will follow up with Mr. Hanney within the next week.    [name] Care Management [phone]

## 2020-01-25 NOTE — Progress Notes (Signed)
PROGRESS NOTE    [name]   [id number]  DOB: 1945-12-20  DOA: 01/24/2020 PCP: Trinna Post, PA-C   Brief Narrative:  Craig [name] is a 74 y.o. male with medical history of DM,  Pancreatic cancer with metastasis to the liver undergoing chemo, CAD s.p CABG x 5, neuropathy, HTN presents to the ED for weakness. He states he has been coming to the ED repeated for the same complaint this week. Today he is found to have DKA. He was diagnosed with DM recently and start on Lantus, the first dose of which he took last night. He states he has limited eating sugary, carb rich foods, drinks a lot of water and urinates a lot. He states he is not eating much because his mouth is dry and he drinks too much water to get food down. He has been drinking 2 bottles of diabetic boost a day. He routinely receives chemo and last received his chemo pump on Monday through Wed.   Subjective: Still feels weak today.  Has some mid abdominal pain from his cancer.    Assessment & Plan:   Principal Problem:   DKA (diabetic ketoacidoses)- DM2-  - A1c was 6.7 on 3/22 and now is 10.7 - C peptide was 6.4 in 3/30 (upper cut off 4.4) suggesting the he is making a large amount of insulin - No longer taking [name] - he is essentially a new diabetic who started insulin on the night before admission - cont to follow until tomorrow to see if regimen will be sufficient to treat his surgars- have resumed Januvia but this is replaced by Tradjenta in the hospital  Active Problems:  AKI- dehydration - BUN 57, Cr 1.43- baseline ~ 0.8 - likely prerenal- Cr improved to baseline but BUN Cr ratio still elevated signifying dehydration and sugars still high so has continued risk for dehydration- cont IVF  Hyponatremia- NA+ 128 - likely due to dehydration and high glucose - now 135  Hyperkalemia- K 5.3 - has improved with IVF and insulin-     Pancreatic cancer metastasized to liver ([place]) - ongoing management  per DR Tasia Catchings with chemo5-FU and liposomal irinotecan    GERD (gastroesophageal reflux disease) - cont Protonix    Hyperlipemia, mixed - hold statin for now    Essential (primary) hypertension - cont Coreg with holding parameters - resumed Cardizem today    Coronary artery disease involving native coronary artery of native heart - cont ASA- holding statin   Anemia - likely of chronic disease- following       Time spent in minutes: 35 DVT prophylaxis: Lovenox Code Status: Full code Family Communication: wife  Disposition Plan: from home- will go home hopefully tomorrow when better hydrated and sugars controlled Consultants:   none Procedures:   none Antimicrobials:  Anti-infectives (From admission, onward)   None       Objective: Vitals:   01/25/20 0400 01/25/20 0500 01/25/20 0800 01/25/20 1159  BP: (!) 148/71 (!) 145/67 131/62   Pulse: (!) 55 (!) 56 62 65  Resp: 16 15 18    Temp:   97.9 F (36.6 C) 97.8 F (36.6 C)  TempSrc:    Oral  SpO2: 99% 100% 100% 99%  Weight:      Height:        Intake/Output Summary (Last 24 hours) at 01/25/2020 1537 Last data filed at 01/25/2020 1401 Gross per 24 hour  Intake 1351.45 ml  Output 2125 ml  Net -773.55 ml   Craig [name]  Weights   01/24/20 1300 01/24/20 1302 01/24/20 1626  Weight: 77.5 kg 77 kg 74.2 kg    Examination: General exam: Appears comfortable  HEENT: PERRLA, oral mucosa moist, no sclera icterus or thrush Respiratory system: Clear to auscultation. Respiratory effort normal. Cardiovascular system: S1 & S2 heard, RRR.   Gastrointestinal system: Abdomen soft,-tender in central abdomen, nondistended. Normal bowel sounds. Central nervous system: Alert and oriented. No focal neurological deficits. Extremities: No cyanosis, clubbing or edema Skin: No rashes or ulcers Psychiatry:  Mood & affect appropriate.     Data Reviewed: I have personally reviewed following labs and imaging studies  CBC: Recent Labs   Lab 01/20/20 1805 01/21/20 0842 01/24/20 1304  WBC 8.0 8.5 8.1  NEUTROABS  --  6.7  --   HGB 11.0* 11.0* 11.3*  HCT 34.3* 34.6* 35.5*  MCV 89.8 89.2 90.1  PLT 181 177 [id number]   Basic Metabolic Panel: Recent Labs  Lab 01/24/20 1304 01/24/20 2206 01/25/20 0213 01/25/20 0611 01/25/20 1001  NA 128* 137 140 138 135  K 5.3* 4.2 4.2 4.0 4.4  CL 93* 107 108 107 103  CO2 16* 23 25 23 24   GLUCOSE 649* 184* 139* 162* 253*  BUN 57* 37* 34* 32* 33*  CREATININE 1.43* 0.91 0.91 0.89 0.92  CALCIUM 9.4 8.9 9.1 8.9 9.0   GFR: Estimated Creatinine Clearance: 71.5 mL/min (by C-G formula based on SCr of 0.92 mg/dL). Liver Function Tests: Recent Labs  Lab 01/21/20 0842  AST 37  ALT 66*  ALKPHOS 90  BILITOT 1.0  PROT 6.9  ALBUMIN 3.5   No results for input(s): LIPASE, AMYLASE in the last 168 hours. No results for input(s): AMMONIA in the last 168 hours. Coagulation Profile: No results for input(s): INR, PROTIME in the last 168 hours. Cardiac Enzymes: No results for input(s): CKTOTAL, CKMB, CKMBINDEX, TROPONINI in the last 168 hours. BNP (last 3 results) No results for input(s): PROBNP in the last 8760 hours. HbA1C: Recent Labs    01/24/20 1304  HGBA1C 10.7*   CBG: Recent Labs  Lab 01/25/20 0438 01/25/20 0547 01/25/20 0627 01/25/20 0735 01/25/20 1116  GLUCAP 153* 166* 160* 152* 239*   Lipid Profile: No results for input(s): CHOL, HDL, LDLCALC, TRIG, CHOLHDL, LDLDIRECT in the last 72 hours. Thyroid Function Tests: No results for input(s): TSH, T4TOTAL, FREET4, T3FREE, THYROIDAB in the last 72 hours. Anemia Panel: No results for input(s): VITAMINB12, FOLATE, FERRITIN, TIBC, IRON, RETICCTPCT in the last 72 hours. Urine analysis:    Component Value Date/Time   COLORURINE STRAW (A) 01/20/2020 1814   APPEARANCEUR CLEAR (A) 01/20/2020 1814   LABSPEC 1.032 (H) 01/20/2020 1814   PHURINE 6.0 01/20/2020 1814   GLUCOSEU >=500 (A) 01/20/2020 1814   HGBUR NEGATIVE 01/20/2020  1814   BILIRUBINUR neg 01/24/2020 1223   KETONESUR NEGATIVE 01/20/2020 1814   PROTEINUR Negative 01/24/2020 1223   PROTEINUR NEGATIVE 01/20/2020 1814   UROBILINOGEN 0.2 01/24/2020 1223   NITRITE neg 01/24/2020 1223   NITRITE NEGATIVE 01/20/2020 1814   LEUKOCYTESUR Negative 01/24/2020 1223   LEUKOCYTESUR NEGATIVE 01/20/2020 1814   Sepsis Labs: @LABRCNTIP (procalcitonin:4,lacticidven:4) ) Recent Results (from the past 240 hour(s))  Respiratory Panel by RT PCR (Flu A&B, Covid) - Nasopharyngeal Swab     Status: None   Collection Time: 01/24/20  2:54 PM   Specimen: Nasopharyngeal Swab  Result Value Ref Range Status   SARS Coronavirus 2 by RT PCR NEGATIVE NEGATIVE Final    Comment: (NOTE) SARS-CoV-2 target nucleic acids are NOT  DETECTED. The SARS-CoV-2 RNA is generally detectable in upper respiratoy specimens during the acute phase of infection. The lowest concentration of SARS-CoV-2 viral copies this assay can detect is 131 copies/mL. A negative result does not preclude SARS-Cov-2 infection and should not be used as the sole basis for treatment or other patient management decisions. A negative result may occur with  improper specimen collection/handling, submission of specimen other than nasopharyngeal swab, presence of viral mutation(s) within the areas targeted by this assay, and inadequate number of viral copies (<131 copies/mL). A negative result must be combined with clinical observations, patient history, and epidemiological information. The expected result is Negative. Fact Sheet for Patients:  PinkCheek.be Fact Sheet for Healthcare Providers:  GravelBags.it This test is not yet ap proved or cleared by the [place] FDA and  has been authorized for detection and/or diagnosis of SARS-CoV-2 by FDA under an Emergency Use Authorization (EUA). This EUA will remain  in effect (meaning this test can be used) for the  duration of the COVID-19 declaration under Section 564(b)(1) of the Act, 21 U.S.C. section 360bbb-3(b)(1), unless the authorization is terminated or revoked sooner.    Influenza A by PCR NEGATIVE NEGATIVE Final   Influenza B by PCR NEGATIVE NEGATIVE Final    Comment: (NOTE) The Xpert Xpress SARS-CoV-2/FLU/RSV assay is intended as an aid in  the diagnosis of influenza from Nasopharyngeal swab specimens and  should not be used as a sole basis for treatment. Nasal washings and  aspirates are unacceptable for Xpert Xpress SARS-CoV-2/FLU/RSV  testing. Fact Sheet for Patients: PinkCheek.be Fact Sheet for Healthcare Providers: GravelBags.it This test is not yet approved or cleared by the [place] FDA and  has been authorized for detection and/or diagnosis of SARS-CoV-2 by  FDA under an Emergency Use Authorization (EUA). This EUA will remain  in effect (meaning this test can be used) for the duration of the  Covid-19 declaration under Section 564(b)(1) of the Act, 21  U.S.C. section 360bbb-3(b)(1), unless the authorization is  terminated or revoked. Performed at [facility], [address] [place], [place] [address]   MRSA PCR Screening     Status: None   Collection Time: 01/24/20  4:32 PM   Specimen: Nasal Mucosa; Nasopharyngeal  Result Value Ref Range Status   MRSA by PCR NEGATIVE NEGATIVE Final    Comment:        The GeneXpert MRSA Assay (FDA approved for NASAL specimens only), is one component of a comprehensive MRSA colonization surveillance program. It is not intended to diagnose MRSA infection nor to guide or monitor treatment for MRSA infections. Performed at [facility], [address] [place], [place] [address]          Radiology Studies: No results found.    Scheduled Meds: . aspirin EC  81 mg Oral Daily  . carvedilol  6.25 mg Oral BID WC  . Chlorhexidine Gluconate Cloth   6 each Topical Daily  . diltiazem  300 mg Oral Daily  . dorzolamide-timolol  1 drop Both Eyes BID  . enoxaparin (LOVENOX) injection  40 mg Subcutaneous Q24H  . gabapentin  600 mg Oral TID  . insulin aspart  0-5 Units Subcutaneous QHS  . insulin aspart  0-9 Units Subcutaneous TID WC  . insulin aspart  2 Units Subcutaneous TID WC  . insulin glargine  10 Units Subcutaneous QPM  . latanoprost  1 drop Both Eyes QHS  . linagliptin  5 mg Oral Daily  . multivitamin with  minerals  1 tablet Oral Daily  . pantoprazole  40 mg Oral Daily  . Ensure Max Protein  11 oz Oral BID BM   Continuous Infusions: . sodium chloride 75 mL/hr at 01/25/20 0631     LOS: 1 day      Debbe Odea, MD [facility] Pager: [email or web address] 01/25/2020, 3:37 PM

## 2020-01-25 NOTE — Progress Notes (Signed)
Initial Nutrition Assessment  DOCUMENTATION CODES:   Non-severe (moderate) malnutrition in context of chronic illness  INTERVENTION:  Provide Ensure Max Protein po BID, each supplement provides 150 kcal and 30 grams of protein. Patient prefers vanilla.  Encouraged adequate intake of calories and protein at meals.  NUTRITION DIAGNOSIS:   Moderate Malnutrition related to chronic illness(metastatic pancreatic cancer) as evidenced by mild fat depletion, mild muscle depletion, moderate muscle depletion.  GOAL:   Patient will meet greater than or equal to 90% of their needs  MONITOR:   PO intake, Supplement acceptance, Labs, Weight trends, I & O's  REASON FOR ASSESSMENT:   Malnutrition Screening Tool    ASSESSMENT:   74 year old male with PMHx of HLD, HTN, GERD, CAD s/p CABG x5, PVD, CHF, DM, pancreatic cancer with mets to liver on chemotherapy admitted with DKA, AKI.   Met with patient at bedside. He reports he has been experiencing decreased appetite and intake since Monday. He reports it is related to chemotherapy. He has had a dry mouth and he drinks so much water to try to stay hydrated that he does not eat as much during the day. One day this week he only had a few bites of a sandwich and a banana in an entire day. He usually drinks Ensure or Boost at home. Lately he had gotten Boost glucose control and was drinking those, but not regularly. Discussed importance of regular intake of ONS to help meet calorie/protein needs. Patient reports his appetite is better today. According to chart he ate 100% of his breakfast this morning.  Patient endorses ongoing weight loss. According to chart he was 203.8 lbs on 03/09/2019. He is now 74.2 kg (163.58 lbs). He has lost 40.22 lbs (19.7% body weight), which is not quite significant for time frame, but is still very concerning.  Medications reviewed and include: Novolog 0-9 units TID, Novolog 0-5 units QHS, Novolot 2 units TID, Lantus 10 units  QHS, Tradjenta 5 mg daily, MVI daily, pantoprazole, NS at 75 mL/hr.  Labs reviewed: CBG 152-166, BUN 33.  NUTRITION - FOCUSED PHYSICAL EXAM:    Most Recent Value  Orbital Region  Mild depletion  Upper Arm Region  Moderate depletion  Thoracic and Lumbar Region  Mild depletion  Buccal Region  Mild depletion  Temple Region  Moderate depletion  Clavicle Bone Region  Moderate depletion  Clavicle and Acromion Bone Region  Mild depletion  Scapular Bone Region  No depletion  Dorsal Hand  Mild depletion  Patellar Region  Mild depletion  Anterior Thigh Region  Mild depletion  Posterior Calf Region  Moderate depletion  Edema (RD Assessment)  None  Hair  Reviewed  Eyes  Reviewed  Mouth  Reviewed  Skin  Reviewed  Nails  Reviewed     Diet Order:   Diet Order            Diet Carb Modified Fluid consistency: Thin; Room service appropriate? Yes  Diet effective now             EDUCATION NEEDS:   No education needs have been identified at this time  Skin:  Skin Assessment: Reviewed RN Assessment  Last BM:  01/23/2020 per chart  Height:   Ht Readings from Last 1 Encounters:  01/24/20 5' 9" (1.753 m)   Weight:   Wt Readings from Last 1 Encounters:  01/24/20 74.2 kg   Ideal Body Weight:  72.7 kg  BMI:  Body mass index is 24.16 kg/m.  Estimated  Nutritional Needs:   Kcal:  1900-2200  Protein:  95-110 grams  Fluid:  1.9-2.2 L/day  Jacklynn Barnacle, MS, RD, LDN Pager number available on Amion

## 2020-01-26 DIAGNOSIS — I119 Hypertensive heart disease without heart failure: Secondary | ICD-10-CM

## 2020-01-26 DIAGNOSIS — E1165 Type 2 diabetes mellitus with hyperglycemia: Secondary | ICD-10-CM

## 2020-01-26 DIAGNOSIS — E0810 Diabetes mellitus due to underlying condition with ketoacidosis without coma: Secondary | ICD-10-CM

## 2020-01-26 DIAGNOSIS — E440 Moderate protein-calorie malnutrition: Secondary | ICD-10-CM

## 2020-01-26 LAB — GLUCOSE, CAPILLARY
Glucose-Capillary: 247 mg/dL — ABNORMAL HIGH (ref 70–99)
Glucose-Capillary: 300 mg/dL — ABNORMAL HIGH (ref 70–99)

## 2020-01-26 MED ORDER — GLUCERNA SHAKE PO LIQD
237.0000 mL | Freq: Three times a day (TID) | ORAL | Status: DC
  Administered 2020-01-26: 237 mL via ORAL

## 2020-01-26 MED ORDER — LANTUS SOLOSTAR 100 UNIT/ML ~~LOC~~ SOPN: 20.0000 [IU] | PEN_INJECTOR | Freq: Every day | SUBCUTANEOUS | 0 refills | Status: AC

## 2020-01-26 MED ORDER — NOVOFINE 30G X 8 MM MISC: 1.0000 | Freq: Four times a day (QID) | 6 refills | Status: DC

## 2020-01-26 MED ORDER — INSULIN LISPRO (1 UNIT DIAL) 100 UNIT/ML (KWIKPEN): 5.0000 [IU] | PEN_INJECTOR | Freq: Three times a day (TID) | SUBCUTANEOUS | 11 refills | Status: AC

## 2020-01-26 MED ORDER — INSULIN GLARGINE 100 UNIT/ML ~~LOC~~ SOLN
20.0000 [IU] | Freq: Every evening | SUBCUTANEOUS | Status: DC
  Filled 2020-01-26: qty 0.2

## 2020-01-26 NOTE — Discharge Summary (Addendum)
Physician Discharge Summary  Craig [name] [id number] DOB: 20-Jul-1946 DOA: 01/24/2020  PCP: Craig Post, PA-C  Admit date: 01/24/2020 Discharge date: 01/26/2020  Admitted From: home Disposition:  home   Recommendations for Outpatient Follow-up:  1. F/u A1c in 1 month 2. He knows to f/u with PCP in 1 wk- Have advised patient to check sugars TID with meals and at bedtime and take readings to PCP 3. HHRN requested to help manage new diagnosis of DM at home- give teaching and diet education  Discharge Condition:  stable   CODE STATUS:  Full code   Diet recommendation:  Diabetic and heart healthy- he takes Boost (thinks it is sugar free but this needs to be confirmed) Consultations:  none Procedures/Studies: . none   Discharge Diagnoses:  Principal Problem:   DKA (diabetic ketoacidoses) ([place]) Active Problems:   GERD (gastroesophageal reflux disease)   Hyperlipemia, mixed   Essential (primary) hypertension   Coronary artery disease involving native coronary artery of native heart   LVH (left ventricular hypertrophy) due to hypertensive disease, without heart failure   Pancreatic cancer metastasized to liver ([place])   Malnutrition of moderate degree   HPI: Craig [name] is a 74 y.o.malewith medical history ofDM, Pancreatic cancer with metastasis to the liver undergoing chemo, CAD s.p CABG x 5, neuropathy, HTN presents to the ED for weakness. He states he has been coming to the ED repeated for the same complaint this week. Today he is found to have DKA. He was diagnosed with DM recently and start on Lantus, the first dose of which he took last night. He states he has limited eating sugary, carb rich foods, drinks a lot of water and urinates a lot. He states he is not eating much because his mouth is dry and he drinks too much water to get food down. He has been drinking 2 bottles of diabetic boost a day. Heroutinely receives chemo and lastreceived his chemo pump on  Monday through Wed.   Hospital Course:  Principal Problem:   DKA (diabetic ketoacidoses)- DM2-  - A1c was 6.7 on 3/22 and now is 10.7 - C peptide was 6.4 in 3/30 (upper cut off 4.4) suggesting the he was making a large amount of insulin - etiology of DKA is not clear as he is no longer taking [name] & pancreatic mass does not encompass the entire pancreas, mainly the nead - can recheck c peptide as outpt - recommend and endocrine referral - he is essentially a new diabetic who started insulin on the night before admission we did keep her to adjust his insulin regimen - His sugars are still high today but will improve by tomorrow hopefully as Lantus will been doubled to 20 U QHS tonight and short acting insulin has been added (5 U with meals)  - both are pens- the patient is in agreement with this plan mainly because he does want to return to the ED - he states his excess thirst and excess urination has resolved completely. He is eating better as well.    Active Problems:  AKI- dehydration - BUN 57, Cr 1.43- baseline ~ 0.8 - likely prerenal-   Continued on IVF and improvement noted  Hyponatremia- NA+ 128 - likely due to dehydration and high glucose - now 135  Hyperkalemia- K 5.3 - has improved with IVF and insulin-   Pancreatic cancer metastasized to liver [facility]) - ongoing management per DR Tasia Catchings with chemo >> 5FU and liposomal irinotecan  GERD (gastroesophageal reflux disease) -  cont Protonix  Hyperlipemia, mixed - hold statin for now  Essential (primary) hypertension - cont Coreg and CArdizem  Coronary artery disease involving native coronary artery of native heart - cont ASA &statin   Anemia - likely of chronic disease- following       Discharge Exam: Vitals:   01/26/20 0600 01/26/20 1011  BP: (!) 152/78 (!) 165/74  Pulse: (!) 59 68  Resp: 15 18  Temp:  (!) 97.5 F (36.4 C)  SpO2: 99% 100%   Vitals:   01/26/20 0400 01/26/20 0500  01/26/20 0600 01/26/20 1011  BP: 127/67 (!) 141/72 (!) 152/78 (!) 165/74  Pulse: (!) 55 (!) 57 (!) 59 68  Resp: 12 12 15 18   Temp:    (!) 97.5 F (36.4 C)  TempSrc:    Oral  SpO2: 98% 99% 99% 100%  Weight:      Height:        General: Pt is alert, awake, not in acute distress Cardiovascular: RRR, S1/S2 +, no rubs, no gallops Respiratory: CTA bilaterally, no wheezing, no rhonchi Abdominal: Soft, NT, ND, bowel sounds + Extremities: no edema, no cyanosis   Discharge Instructions  Discharge Instructions    Diet - low sodium heart healthy   Complete by: As directed    Diet Carb Modified   Complete by: As directed    Increase activity slowly   Complete by: As directed      Allergies as of 01/26/2020      Reactions   Lipitor [atorvastatin] Other (See Comments)   MYALGIA   Zetia [ezetimibe] Other (See Comments)   MYALGIA   Lisinopril Cough   Protonix [pantoprazole] Other (See Comments)   headache   Spironolactone Other (See Comments)   Breast tenderness      Medication List    TAKE these medications   aspirin EC 81 MG tablet Take 81 mg by mouth daily.   carvedilol 6.25 MG tablet Commonly known as: COREG Take 6.25 mg by mouth 2 (two) times daily with a meal.   diltiazem 300 MG 24 hr capsule Commonly known as: CARDIZEM CD TAKE 1 CAPSULE BY MOUTH EVERY DAY   dorzolamide-timolol 22.3-6.8 MG/ML ophthalmic solution Commonly known as: COSOPT INSTILL ONE DROP INTO BOTH EYES TWICE DAILY   ferrous sulfate 325 (65 FE) MG tablet TAKE 1 TABLET BY MOUTH EVERY DAY WITH BREAKFAST   gabapentin 300 MG capsule Commonly known as: NEURONTIN TAKE 2 CAPSULES (600 MG TOTAL) BY MOUTH 3 (THREE) TIMES DAILY.   ibuprofen 800 MG tablet Commonly known as: ADVIL Take 1 tablet (800 mg total) by mouth every 8 (eight) hours as needed.   insulin lispro 100 UNIT/ML KwikPen Commonly known as: HUMALOG Inject 0.05 mLs (5 Units total) into the skin 3 (three) times daily with meals.    Lantus SoloStar 100 UNIT/ML Solostar Pen Generic drug: insulin glargine Inject 20 Units into the skin at bedtime. What changed: how much to take   latanoprost 0.005 % ophthalmic solution Commonly known as: XALATAN Place 1 drop into both eyes at bedtime.   lidocaine-prilocaine cream Commonly known as: EMLA APPLY TO AFFECTED AREA ONCE AS DIRECTED   loperamide 2 MG tablet Commonly known as: Imodium A-D Take 1 tablet (2 mg total) by mouth See admin instructions. Take 2 at diarrhea onset , then 1 every 2hr until 12hrs with no BM. May take 2 every 4hrs at night. If diarrhea recurs repeat.   multivitamin with minerals Tabs tablet Take 1 tablet by mouth daily.  NovoFine 32G X 6 MM Misc Generic drug: Insulin Pen Needle Use to inject basal insulin once before bed.   OMEGA-3 FATTY ACIDS PO Take 1,200 mg by mouth daily.   omeprazole 40 MG capsule Commonly known as: PRILOSEC TAKE 1 CAPSULE BY MOUTH EVERY DAY   oxyCODONE 5 MG immediate release tablet Commonly known as: Oxy IR/ROXICODONE Take 1 tablet (5 mg total) by mouth every 6 (six) hours as needed for severe pain.   sitaGLIPtin 100 MG tablet Commonly known as: JANUVIA Take 100 mg by mouth daily.      Follow-up Information    Craig Post, PA-C. Schedule an appointment as soon as possible for a visit in 1 week(s).   Specialty: Physician Assistant Why: Check your sugars 3 times a day with meals and at bedtime. Take the readings to your PCP's office.  Contact information: [address] [place] [address] [phone]          Allergies  Allergen Reactions  . Lipitor [Atorvastatin] Other (See Comments)    MYALGIA  . Zetia [Ezetimibe] Other (See Comments)    MYALGIA  . Lisinopril Cough  . Protonix [Pantoprazole] Other (See Comments)    headache  . Spironolactone Other (See Comments)    Breast tenderness      CT CHEST W CONTRAST  Result Date: 01/02/2020 CLINICAL DATA:  74 year old male  with history of pancreatic cancer with liver metastasis. Elevated PSA. EXAM: CT CHEST, ABDOMEN, AND PELVIS WITH CONTRAST TECHNIQUE: Multidetector CT imaging of the chest, abdomen and pelvis was performed following the standard protocol during bolus administration of intravenous contrast. CONTRAST:  112mL OMNIPAQUE IOHEXOL 300 MG/ML  SOLN COMPARISON:  CT of the chest abdomen pelvis dated 11/05/2019. FINDINGS: CT CHEST FINDINGS Cardiovascular: There is no cardiomegaly or pericardial effusion. Three-vessel coronary vascular calcification and postsurgical changes of CABG. There is advanced atherosclerotic calcification of the thoracic aorta. No aneurysmal dilatation or dissection. The origins of the great vessels of the aortic arch appear patent as visualized. The central pulmonary arteries appear patent for the degree of opacification. Mediastinum/Nodes: No hilar or mediastinal adenopathy. The esophagus is grossly unremarkable. Several subcentimeter hypodense thyroid nodules. No followup recommended (Ref: J Am Coll Radiol. 2015 Feb;12(2): 143-50). No mediastinal fluid collection. Lungs/Pleura: There is a 4.2 x 3.0 cm ovoid soft tissue mass involving the medial left lung base similar to prior CT. 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 Several small subpleural nodules primarily in the right lung. A 3 mm right upper lobe nodule (series 5, image 64) similar or minimally increased in size. There is a 5 mm subpleural nodule in the right lower lobe (series 5, image 99) new since the prior CT. Additional 5 mm right lung base subpleural nodule (series 5, image 14) also appears new since the prior CT. There is an 8 mm nodule at the left lung base (series 5, image 111) which has significantly increased in size since the prior CT. A 4 mm left lower lobe nodule (series 5, image 103) is new since the prior CT. These nodules are concerning for metastatic disease. There is no focal  consolidation, pleural effusion, pneumothorax. The central airways are patent. Musculoskeletal: Median sternotomy wires. Right pectoral Port-A-Cath with tip at the cavoatrial junction. There is osteopenia with degenerative changes of the spine. No acute osseous pathology. No definite suspicious osseous lesions. CT ABDOMEN PELVIS FINDINGS No intra-abdominal free air or free fluid. Hepatobiliary: There is a background of  fatty infiltration of the liver. Interval increase in the size and number of hepatic hypodense lesions consistent with progression of metastatic disease. The largest lesion measures approximately 3.4 x 2.8 cm in the right lobe of the liver (previously 2.0 x 1.1 cm). No intrahepatic biliary ductal dilatation. Cholecystectomy. Pancreas: There is a 4.0 x 3.0 cm (previously 3.5 x 2.8 cm) soft tissue lesion in the region of the uncinate process of the pancreas (series 3, image 64). There is mild diffuse pancreatic atrophy and dilatation of the main pancreatic duct measuring up to 5 mm in diameter and progressed since the prior CT. There is mild haziness of the peripancreatic fat, likely reactive. Spleen: Normal in size without focal abnormality. Adrenals/Urinary Tract: The adrenal glands are unremarkable. There is no hydronephrosis on either side. Bilateral renal cysts measuring up to 4.3 cm in the inferior pole of the right kidney. There is symmetric enhancement and excretion of contrast by both kidneys. The visualized ureters and urinary bladder appear unremarkable. Stomach/Bowel: There is moderate stool throughout the colon. No bowel obstruction or active inflammation. The appendix is normal. Vascular/Lymphatic: Advanced aortoiliac atherosclerotic disease. The IVC is grossly unremarkable. Probable mild narrowing of the porta splenic confluence secondary to pancreatic head mass. The SMV, splenic vein, and main portal vein are patent. No portal venous gas. Mildly enlarged pancreatico caval lymph node  measures 15 mm (series 3, image 59). Reproductive: Mildly enlarged prostate gland. The seminal vesicles are symmetric. Other: None Musculoskeletal: Osteopenia with degenerative changes of the spine. L4-L5 posterior fusion and disc spacer. No acute osseous pathology. IMPRESSION: 1. Overall progression of hepatic and pulmonary metastatic disease since the prior CT of 11/05/2019. 2. Interval increase in the size of the pancreatic head mass and pancreatic duct dilatation. 3. A 15 mm peripancreatic lymph node, increased in size since the prior CT. 4. Ovoid soft tissue mass in the medial left lung base, similar to prior CT. 5. 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 6. No bowel obstruction. Normal appendix. 7. Aortic Atherosclerosis (ICD10-I70.0). Electronically Signed   By: Anner Crete M.D.   On: 01/02/2020 18:49   CT ABDOMEN PELVIS W CONTRAST  Result Date: 01/02/2020 CLINICAL DATA:  74 year old male with history of pancreatic cancer with liver metastasis. Elevated PSA. EXAM: CT CHEST, ABDOMEN, AND PELVIS WITH CONTRAST TECHNIQUE: Multidetector CT imaging of the chest, abdomen and pelvis was performed following the standard protocol during bolus administration of intravenous contrast. CONTRAST:  124mL OMNIPAQUE IOHEXOL 300 MG/ML  SOLN COMPARISON:  CT of the chest abdomen pelvis dated 11/05/2019. FINDINGS: CT CHEST FINDINGS Cardiovascular: There is no cardiomegaly or pericardial effusion. Three-vessel coronary vascular calcification and postsurgical changes of CABG. There is advanced atherosclerotic calcification of the thoracic aorta. No aneurysmal dilatation or dissection. The origins of the great vessels of the aortic arch appear patent as visualized. The central pulmonary arteries appear patent for the degree of opacification. Mediastinum/Nodes: No hilar or mediastinal adenopathy. The esophagus is grossly unremarkable. Several subcentimeter hypodense thyroid nodules. No followup  recommended (Ref: J Am Coll Radiol. 2015 Feb;12(2): 143-50). No mediastinal fluid collection. Lungs/Pleura: There is a 4.2 x 3.0 cm ovoid soft tissue mass involving the medial left lung base similar to prior CT. 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 Several small subpleural nodules primarily in the right lung. A 3 mm right upper lobe nodule (series 5, image 64) similar or minimally increased in size. There is a 5 mm subpleural nodule in the  right lower lobe (series 5, image 99) new since the prior CT. Additional 5 mm right lung base subpleural nodule (series 5, image 14) also appears new since the prior CT. There is an 8 mm nodule at the left lung base (series 5, image 111) which has significantly increased in size since the prior CT. A 4 mm left lower lobe nodule (series 5, image 103) is new since the prior CT. These nodules are concerning for metastatic disease. There is no focal consolidation, pleural effusion, pneumothorax. The central airways are patent. Musculoskeletal: Median sternotomy wires. Right pectoral Port-A-Cath with tip at the cavoatrial junction. There is osteopenia with degenerative changes of the spine. No acute osseous pathology. No definite suspicious osseous lesions. CT ABDOMEN PELVIS FINDINGS No intra-abdominal free air or free fluid. Hepatobiliary: There is a background of fatty infiltration of the liver. Interval increase in the size and number of hepatic hypodense lesions consistent with progression of metastatic disease. The largest lesion measures approximately 3.4 x 2.8 cm in the right lobe of the liver (previously 2.0 x 1.1 cm). No intrahepatic biliary ductal dilatation. Cholecystectomy. Pancreas: There is a 4.0 x 3.0 cm (previously 3.5 x 2.8 cm) soft tissue lesion in the region of the uncinate process of the pancreas (series 3, image 64). There is mild diffuse pancreatic atrophy and dilatation of the main pancreatic duct measuring up to 5 mm in  diameter and progressed since the prior CT. There is mild haziness of the peripancreatic fat, likely reactive. Spleen: Normal in size without focal abnormality. Adrenals/Urinary Tract: The adrenal glands are unremarkable. There is no hydronephrosis on either side. Bilateral renal cysts measuring up to 4.3 cm in the inferior pole of the right kidney. There is symmetric enhancement and excretion of contrast by both kidneys. The visualized ureters and urinary bladder appear unremarkable. Stomach/Bowel: There is moderate stool throughout the colon. No bowel obstruction or active inflammation. The appendix is normal. Vascular/Lymphatic: Advanced aortoiliac atherosclerotic disease. The IVC is grossly unremarkable. Probable mild narrowing of the porta splenic confluence secondary to pancreatic head mass. The SMV, splenic vein, and main portal vein are patent. No portal venous gas. Mildly enlarged pancreatico caval lymph node measures 15 mm (series 3, image 59). Reproductive: Mildly enlarged prostate gland. The seminal vesicles are symmetric. Other: None Musculoskeletal: Osteopenia with degenerative changes of the spine. L4-L5 posterior fusion and disc spacer. No acute osseous pathology. IMPRESSION: 1. Overall progression of hepatic and pulmonary metastatic disease since the prior CT of 11/05/2019. 2. Interval increase in the size of the pancreatic head mass and pancreatic duct dilatation. 3. A 15 mm peripancreatic lymph node, increased in size since the prior CT. 4. Ovoid soft tissue mass in the medial left lung base, similar to prior CT. 5. 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 6. No bowel obstruction. Normal appendix. 7. Aortic Atherosclerosis (ICD10-I70.0). Electronically Signed   By: Anner Crete M.D.   On: 01/02/2020 18:49      The results of significant diagnostics from this hospitalization (including imaging, microbiology, ancillary and laboratory) are listed below for reference.      Microbiology: Recent Results (from the past 240 hour(s))  Respiratory Panel by RT PCR (Flu A&B, Covid) - Nasopharyngeal Swab     Status: None   Collection Time: 01/24/20  2:54 PM   Specimen: Nasopharyngeal Swab  Result Value Ref Range Status   SARS Coronavirus 2 by RT PCR NEGATIVE NEGATIVE Final    Comment: (NOTE) SARS-CoV-2 target nucleic acids are NOT  DETECTED. The SARS-CoV-2 RNA is generally detectable in upper respiratoy specimens during the acute phase of infection. The lowest concentration of SARS-CoV-2 viral copies this assay can detect is 131 copies/mL. A negative result does not preclude SARS-Cov-2 infection and should not be used as the sole basis for treatment or other patient management decisions. A negative result may occur with  improper specimen collection/handling, submission of specimen other than nasopharyngeal swab, presence of viral mutation(s) within the areas targeted by this assay, and inadequate number of viral copies (<131 copies/mL). A negative result must be combined with clinical observations, patient history, and epidemiological information. The expected result is Negative. Fact Sheet for Patients:  PinkCheek.be Fact Sheet for Healthcare Providers:  GravelBags.it This test is not yet ap proved or cleared by the [place] FDA and  has been authorized for detection and/or diagnosis of SARS-CoV-2 by FDA under an Emergency Use Authorization (EUA). This EUA will remain  in effect (meaning this test can be used) for the duration of the COVID-19 declaration under Section 564(b)(1) of the Act, 21 U.S.C. section 360bbb-3(b)(1), unless the authorization is terminated or revoked sooner.    Influenza A by PCR NEGATIVE NEGATIVE Final   Influenza B by PCR NEGATIVE NEGATIVE Final    Comment: (NOTE) The Xpert Xpress SARS-CoV-2/FLU/RSV assay is intended as an aid in  the diagnosis of influenza from  Nasopharyngeal swab specimens and  should not be used as a sole basis for treatment. Nasal washings and  aspirates are unacceptable for Xpert Xpress SARS-CoV-2/FLU/RSV  testing. Fact Sheet for Patients: PinkCheek.be Fact Sheet for Healthcare Providers: GravelBags.it This test is not yet approved or cleared by the [place] FDA and  has been authorized for detection and/or diagnosis of SARS-CoV-2 by  FDA under an Emergency Use Authorization (EUA). This EUA will remain  in effect (meaning this test can be used) for the duration of the  Covid-19 declaration under Section 564(b)(1) of the Act, 21  U.S.C. section 360bbb-3(b)(1), unless the authorization is  terminated or revoked. Performed at [facility], [address] [place], [place] [address]   MRSA PCR Screening     Status: None   Collection Time: 01/24/20  4:32 PM   Specimen: Nasal Mucosa; Nasopharyngeal  Result Value Ref Range Status   MRSA by PCR NEGATIVE NEGATIVE Final    Comment:        The GeneXpert MRSA Assay (FDA approved for NASAL specimens only), is one component of a comprehensive MRSA colonization surveillance program. It is not intended to diagnose MRSA infection nor to guide or monitor treatment for MRSA infections. Performed at [facility], [address] [place], [place] [address]      Labs: BNP (last 3 results) No results for input(s): BNP in the last 8760 hours. Basic Metabolic Panel: Recent Labs  Lab 01/24/20 1304 01/24/20 2206 01/25/20 0213 01/25/20 0611 01/25/20 1001  NA 128* 137 140 138 135  K 5.3* 4.2 4.2 4.0 4.4  CL 93* 107 108 107 103  CO2 16* 23 25 23 24   GLUCOSE 649* 184* 139* 162* 253*  BUN 57* 37* 34* 32* 33*  CREATININE 1.43* 0.91 0.91 0.89 0.92  CALCIUM 9.4 8.9 9.1 8.9 9.0   Liver Function Tests: Recent Labs  Lab 01/21/20 0842  AST 37  ALT 66*  ALKPHOS 90  BILITOT 1.0  PROT 6.9  ALBUMIN  3.5   No results for input(s): LIPASE, AMYLASE in the last 168 hours. No results for input(s): AMMONIA  in the last 168 hours. CBC: Recent Labs  Lab 01/20/20 1805 01/21/20 0842 01/24/20 1304  WBC 8.0 8.5 8.1  NEUTROABS  --  6.7  --   HGB 11.0* 11.0* 11.3*  HCT 34.3* 34.6* 35.5*  MCV 89.8 89.2 90.1  PLT 181 177 239   Cardiac Enzymes: No results for input(s): CKTOTAL, CKMB, CKMBINDEX, TROPONINI in the last 168 hours. BNP: Invalid input(s): POCBNP CBG: Recent Labs  Lab 01/25/20 0735 01/25/20 1116 01/25/20 2151 01/26/20 0735 01/26/20 1156  GLUCAP 152* 239* 356* 247* 300*   D-Dimer No results for input(s): DDIMER in the last 72 hours. Hgb A1c Recent Labs    01/24/20 1304  HGBA1C 10.7*   Lipid Profile No results for input(s): CHOL, HDL, LDLCALC, TRIG, CHOLHDL, LDLDIRECT in the last 72 hours. Thyroid function studies No results for input(s): TSH, T4TOTAL, T3FREE, THYROIDAB in the last 72 hours.  Invalid input(s): FREET3 Anemia work up No results for input(s): VITAMINB12, FOLATE, FERRITIN, TIBC, IRON, RETICCTPCT in the last 72 hours. Urinalysis    Component Value Date/Time   COLORURINE STRAW (A) 01/20/2020 1814   APPEARANCEUR CLEAR (A) 01/20/2020 1814   LABSPEC 1.032 (H) 01/20/2020 1814   PHURINE 6.0 01/20/2020 1814   GLUCOSEU >=500 (A) 01/20/2020 1814   HGBUR NEGATIVE 01/20/2020 1814   BILIRUBINUR neg 01/24/2020 1223   KETONESUR NEGATIVE 01/20/2020 1814   PROTEINUR Negative 01/24/2020 1223   PROTEINUR NEGATIVE 01/20/2020 1814   UROBILINOGEN 0.2 01/24/2020 1223   NITRITE neg 01/24/2020 1223   NITRITE NEGATIVE 01/20/2020 1814   LEUKOCYTESUR Negative 01/24/2020 1223   LEUKOCYTESUR NEGATIVE 01/20/2020 1814   Sepsis Labs Invalid input(s): PROCALCITONIN,  WBC,  LACTICIDVEN Microbiology Recent Results (from the past 240 hour(s))  Respiratory Panel by RT PCR (Flu A&B, Covid) - Nasopharyngeal Swab     Status: None   Collection Time: 01/24/20  2:54 PM   Specimen:  Nasopharyngeal Swab  Result Value Ref Range Status   SARS Coronavirus 2 by RT PCR NEGATIVE NEGATIVE Final    Comment: (NOTE) SARS-CoV-2 target nucleic acids are NOT DETECTED. The SARS-CoV-2 RNA is generally detectable in upper respiratoy specimens during the acute phase of infection. The lowest concentration of SARS-CoV-2 viral copies this assay can detect is 131 copies/mL. A negative result does not preclude SARS-Cov-2 infection and should not be used as the sole basis for treatment or other patient management decisions. A negative result may occur with  improper specimen collection/handling, submission of specimen other than nasopharyngeal swab, presence of viral mutation(s) within the areas targeted by this assay, and inadequate number of viral copies (<131 copies/mL). A negative result must be combined with clinical observations, patient history, and epidemiological information. The expected result is Negative. Fact Sheet for Patients:  PinkCheek.be Fact Sheet for Healthcare Providers:  GravelBags.it This test is not yet ap proved or cleared by the [place] FDA and  has been authorized for detection and/or diagnosis of SARS-CoV-2 by FDA under an Emergency Use Authorization (EUA). This EUA will remain  in effect (meaning this test can be used) for the duration of the COVID-19 declaration under Section 564(b)(1) of the Act, 21 U.S.C. section 360bbb-3(b)(1), unless the authorization is terminated or revoked sooner.    Influenza A by PCR NEGATIVE NEGATIVE Final   Influenza B by PCR NEGATIVE NEGATIVE Final    Comment: (NOTE) The Xpert Xpress SARS-CoV-2/FLU/RSV assay is intended as an aid in  the diagnosis of influenza from Nasopharyngeal swab specimens and  should not be used as  a sole basis for treatment. Nasal washings and  aspirates are unacceptable for Xpert Xpress SARS-CoV-2/FLU/RSV  testing. Fact Sheet for  Patients: PinkCheek.be Fact Sheet for Healthcare Providers: GravelBags.it This test is not yet approved or cleared by the [place] FDA and  has been authorized for detection and/or diagnosis of SARS-CoV-2 by  FDA under an Emergency Use Authorization (EUA). This EUA will remain  in effect (meaning this test can be used) for the duration of the  Covid-19 declaration under Section 564(b)(1) of the Act, 21  U.S.C. section 360bbb-3(b)(1), unless the authorization is  terminated or revoked. Performed at [facility], [address] [place], [place] [address]   MRSA PCR Screening     Status: None   Collection Time: 01/24/20  4:32 PM   Specimen: Nasal Mucosa; Nasopharyngeal  Result Value Ref Range Status   MRSA by PCR NEGATIVE NEGATIVE Final    Comment:        The GeneXpert MRSA Assay (FDA approved for NASAL specimens only), is one component of a comprehensive MRSA colonization surveillance program. It is not intended to diagnose MRSA infection nor to guide or monitor treatment for MRSA infections. Performed at [facility], [address] [place], [place] [address]      Time coordinating discharge in minutes: 65  SIGNED:   Debbe Odea, MD  [facility] 01/26/2020, 1:22 PM

## 2020-01-26 NOTE — Plan of Care (Signed)
Patient discharged to home with wife.  PIV d/c tip intact. Patient medications and discharge packet reviewed.  Patient states takes blood sugar at home and gives self insulin. Reviewed insulin changes with wife and patient. Gave pen needle starter kit at discharge and patient states know how to use on his pens. Instructed RN would be calling to review diabetes medication/insulin teaching as well.  Patient and wife appreciative and verbalized udnerstanding.  Transported to front via w/c.

## 2020-01-26 NOTE — TOC Initial Note (Addendum)
Transition of Care [facility]) - Initial/Assessment Note    Patient Details  Name: Craig [name] MRN: [id number] Date of Birth: 05/11/1946  Transition of [facility]) CM/SW Contact:    Elliot Gurney [place],  Phone Number: 01/26/2020, 3:22 PM  Clinical Narrative:                 Patient  is a 74 y.o.malewith medical history ofDM, Pancreatic cancer with metastasis to the liver undergoing chemo, who presented to the ED for weakness. Patient's doctor requested that patient discharge home today with nursing [facility].for diabetes education and management. This Education officer, museum contacted [facility], [facility], [facility], [facility], [facility], [facility] and [facility] which have all declined patient for RN [facility] services stating staffing or that they do not take his insurance. Patient however is currently being followed by the Chronic Care Management program through the [facility] and can be provided with diabetes education through this program-specifically about the short acting insulin per attending's request. Patient informed of above plan.   Expected Discharge Plan: Home/Self Care Barriers to Discharge: No Barriers Identified   Patient Goals and CMS Choice Patient states their goals for this hospitalization and ongoing recovery are:: "to get my sugar down" CMS Medicare.gov Compare Post Acute Care list provided to:: Patient Choice offered to / list presented to : Patient  Expected Discharge Plan and Services Expected Discharge Plan: Home/Self Care In-house Referral: Clinical Social Work Discharge Planning Services: NA   Living arrangements for the past 2 months: Single Family Home Expected Discharge Date: 01/26/20                         [facility] Arranged: (discharge plan changed to home with self care)          Prior Living Arrangements/Services Living arrangements for the past 2 months: Single Family Home Lives with:: Spouse Patient language and need for  interpreter reviewed:: No Do you feel safe going back to the place where you live?: Yes      Need for Family Participation in Patient Care: No (Comment) Care giver support system in place?: No (comment)   Criminal Activity/Legal Involvement Pertinent to Current Situation/Hospitalization: No - Comment as needed  Activities of Daily Living Home Assistive Devices/Equipment: None ADL Screening (condition at time of admission) Patient's cognitive ability adequate to safely complete daily activities?: Yes Is the patient deaf or have difficulty hearing?: No Does the patient have difficulty seeing, even when wearing glasses/contacts?: No Does the patient have difficulty concentrating, remembering, or making decisions?: No Patient able to express need for assistance with ADLs?: Yes Does the patient have difficulty dressing or bathing?: No Independently performs ADLs?: Yes (appropriate for developmental age) Does the patient have difficulty walking or climbing stairs?: No Weakness of Legs: None Weakness of Arms/Hands: None  Permission Sought/Granted Permission sought to share information with : Family Supports Permission granted to share information with : Yes, Verbal Permission Granted        Permission granted to share info w Relationship: Tallon Finkle  Permission granted to share info w Contact Information: [phone]  Emotional Assessment Appearance:: Appears stated age Attitude/Demeanor/Rapport: Engaged Affect (typically observed): Accepting Orientation: : Oriented to Self, Oriented to Place, Oriented to  Time, Oriented to Situation Alcohol / Substance Use: Not Applicable Psych Involvement: No (comment)  Admission diagnosis:  Dehydration [E86.0] DKA (diabetic ketoacidoses) ([place]) [E11.10] Malignant neoplasm of pancreas, unspecified location of malignancy ([place]) [C25.9] Diabetic ketoacidosis without coma  associated with diabetes mellitus due to underlying condition [facility])  [E08.10] Patient Active Problem List   Diagnosis Date Noted  . Malnutrition of moderate degree 01/26/2020  . DKA (diabetic ketoacidoses) ([place]) 01/24/2020  . Fatigue 10/03/2019  . Low-level of literacy 06/29/2019  . Abnormal LFTs 06/29/2019  . AKI (acute kidney injury) ([place]) 06/29/2019  . Diarrhea 06/29/2019  . Orthostatic hypotension 06/29/2019  . Hyperglycemia 06/29/2019  . Anemia due to antineoplastic chemotherapy 06/29/2019  . Dehydration 06/21/2019  . Encounter for antineoplastic chemotherapy 06/21/2019  . Normocytic anemia 06/21/2019  . Port-A-Cath in place 06/07/2019  . Goals of care, counseling/discussion 06/01/2019  . Pancreatic cancer metastasized to liver ([place]) 06/01/2019  . Liver metastasis ([place]) 06/01/2019  . CAD (coronary artery disease) 05/17/2019  . Personal history of tobacco use, presenting hazards to health 05/10/2019  . Peripheral arterial disease ([place]) 05/09/2019  . Prediabetes 05/01/2019  . LVH (left ventricular hypertrophy) due to hypertensive disease, without heart failure 01/04/2018  . 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 Postoperative anemia   . Coronary artery disease involving native coronary artery of native heart   . Coronary artery disease involving native coronary artery of native heart 09/16/2017  . Tobacco abuse 08/11/2017  . Spondylolisthesis, lumbar region 03/16/2017  . Hyperlipemia, mixed 09/10/2015  . 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 Essential (primary) hypertension 09/10/2015  . Degeneration of cervical intervertebral disc 09/10/2015  . Allergic rhinitis 09/10/2015  . GERD (gastroesophageal reflux disease) 06/25/2015  . Cervical stenosis of spinal canal 06/25/2015  . Atherosclerosis of native arteries of extremity with intermittent claudication ([place]) 06/25/2015   PCP:  Trinna Post, PA-C Pharmacy:   [id number] - [place], [place] - [address] [place] [address] Phone: [phone] Fax:  [place], [place] [address] [place] [address] Phone: [phone] Fax: [phone]     Social Determinants of Health (SDOH) Interventions    Readmission Risk Interventions No flowsheet data found.

## 2020-01-26 NOTE — Discharge Instructions (Signed)
Dehydration, Elderly Dehydration is a condition in which there is not enough water or other fluids in the body. This happens when a person loses more fluids than he or she takes in. Important organs, such as the kidneys, brain, and heart, cannot function without a proper amount of fluids. Any loss of fluids from the body can lead to dehydration. People 73 years of age or older have a higher risk of dehydration than younger adults. This is because in older age, the body:  Is less able to maintain the right amount of water.  Does not respond to temperature changes as well.  Does not get a sense of thirst as easily or quickly. Dehydration can be mild, moderate, or severe. It should be treated right away to prevent it from becoming severe. What are the causes? Dehydration may be caused by:  Conditions that cause loss of water or other fluids, such as diarrhea, vomiting, or sweating or urinating a lot.  Not drinking enough fluids, especially when you are ill or doing activities that require a lot of energy.  Other illnesses and conditions, such as fever or infection.  Certain medicines, such as medicines that remove excess fluid from the body (diuretics).  Lack of safe drinking water.  Not being able to get enough water and food. What increases the risk? This condition is more likely to develop in people who:  Have a long-term (chronic) illness, such as: ? An illness that may cause you to urinate more, such as diabetes. ? Kidney, heart, or lung disease that have not been treated properly. ? A disease of the brain and nervous system or a disorder that affects your thinking and emotions, such as dementia.  Are 65 years or older.  Have a disability.  Live in a place that is high in altitude, where thinner, drier air causes you to have more fluid loss. What are the signs or symptoms? Symptoms of dehydration depend on how severe it is. Mild or moderate dehydration  Thirst.  Dry  lips or dry mouth.  Dizziness or light-headedness, especially when standing up from a seated position.  Muscle cramps.  Dark urine. Urine may be the color of tea.  Less urine or tears produced than usual.  Headache. Severe dehydration  Changes in skin. Your skin may be cold and clammy, blotchy, or pale. Your skin also may not return to normal after being lightly pinched and released.  Little or no tears, urine, or sweat.  Changes in vital signs, such as rapid breathing and low blood pressure. Your pulse may be weak or may be faster than 100 beats a minute when you are sitting still.  Other changes, such as: ? Feeling very thirsty. ? Sunken eyes. ? Cold hands and feet. ? Confusion. ? Being very tired (lethargic) or having trouble waking from sleep. ? Short-term weight loss. ? Loss of consciousness. How is this diagnosed? This condition is diagnosed based on your symptoms and a physical exam. Blood and urine tests may help confirm the diagnosis. How is this treated? Treatment for this condition depends on how severe it is. Treatment should be started right away. Do not wait until dehydration becomes severe. Severe dehydration is an emergency and needs to be treated in a hospital.  Mild or moderate dehydration can often be treated at home. You may be asked to: ? Drink more fluids. ? Drink an oral rehydration solution (ORS). This drink helps restore proper amounts of fluids and salts and minerals in  the blood (electrolytes).  Severe dehydration can be treated: ? With IV fluids. ? By correcting abnormal levels of electrolytes. This is often done by giving electrolytes through a tube that is passed through your nose and into your stomach (nasogastric tube, or NG tube). ? By treating the underlying cause of dehydration. Follow these instructions at home: Oral rehydration solution If told by your health care provider, drink an ORS:  Make an ORS by following instructions on the  package.  Start by drinking small amounts, about  cup (120 mL) every 5-10 minutes.  Slowly increase how much you drink until you have taken the amount recommended by your health care provider. Eating and drinking         Drink enough clear fluid to keep your urine pale yellow. If you were told to drink an ORS, finish the ORS first and then start slowly drinking other clear fluids. Drink fluids such as: ? Water. Do not drink only water. Doing that can lead to hyponatremia, which is having too little salt (sodium) in the body. ? Water from ice chips you suck on. ? Fruit juice that you have added water to (diluted fruit juice). ? Low-calorie sports drinks.  Eat foods that contain a healthy balance of electrolytes, such as bananas, oranges, potatoes, tomatoes, and spinach.  Do not drink alcohol.  Avoid the following: ? Drinks that contain a lot of sugar. These include high-calorie sports drinks, fruit juice that is not diluted, and soda. ? Caffeine. ? Foods that are greasy or contain a lot of fat or sugar. General instructions  Take over-the-counter and prescription medicines only as told by your health care provider.  Do not take sodium tablets. This can lead to having too much sodium in the body (hypernatremia).  Return to your normal activities as told by your health care provider. Ask your health care provider what activities are safe for you.  Keep all follow-up visits as told by your health care provider. This is important. Contact a health care provider if you:  Have pain in your abdomen and the pain gets worse or stays in one area (localizes).  Have a rash.  Have a stiff neck.  Are more irritable than usual.  Are sleepier or have a harder time waking than usual.  Feel weak or dizzy.  Are very thirsty. Get help right away if you have:  Any symptoms of severe dehydration.  A fever.  A severe headache.  Symptoms of vomiting, such as: ? Your vomiting gets  worse or does not go away. ? Your vomit includes blood or green matter (bile). ? You cannot eat or drink without vomiting.  Problems with urination or bowel movements, such as: ? Diarrhea that gets worse or does not go away. ? Blood in your stool (feces). This may cause stool to look black and tarry. ? Not urinating, or urinating only a small amount of very dark urine, within 6-8 hours.  Trouble breathing.  Symptoms that get worse with treatment. These symptoms may represent a serious problem that is an emergency. Do not wait to see if the symptoms will go away. Get medical help right away. Call your local emergency services (911 in the [place] Do not drive yourself to the hospital. Summary  Dehydration is a condition in which there is not enough water or other fluids in the body. This happens when a person loses more fluids than he or she takes in.  Treatment for this condition depends on the  severity. Treatment should be started right away. Do not wait until dehydration becomes severe.  Drink enough clear fluid to keep your urine pale yellow. If you were told to drink an oral rehydration solution (ORS), finish the ORS first and then start slowly drinking other clear fluids.  Take over-the-counter and prescription medicines only as told by your health care provider.  Get help right away if you have any symptoms of severe dehydration. This information is not intended to replace advice given to you by your health care provider. Make sure you discuss any questions you have with your health care provider. Document Revised: 05/10/2019 Document Reviewed: 05/10/2019 Elsevier Patient Education  2020 [place] were cared for by a hospitalist during your hospital stay. If you have any questions about your discharge medications or the care you received while you were in the hospital after you are discharged, you can call the unit and asked to speak with the hospitalist on call if the  hospitalist that took care of you is not available. Once you are discharged, your primary care physician will handle any further medical issues.     Diabetic Ketoacidosis Diabetic ketoacidosis is a serious complication of diabetes. This condition develops when there is not enough insulin in the body. Insulin is an hormone that regulates blood sugar levels in the body. Normally, insulin allows glucose to enter the cells in the body. The cells break down glucose for energy. Without enough insulin, the body cannot break down glucose, so it breaks down fats instead. This leads to high blood glucose levels in the body and the production of acids that are called ketones. Ketones are poisonous at high levels. If diabetic ketoacidosis is not treated, it can cause severe dehydration and can lead to a coma or death. What are the causes? This condition develops when a lack of insulin causes the body to break down fats instead of glucose. This may be triggered by:  Stress on the body. This stress is brought on by an illness.  Infection.  Medicines that raise blood glucose levels.  Not taking diabetes medicine.  New onset of type 1 diabetes mellitus. What are the signs or symptoms? Symptoms of this condition include:  Fatigue.  Weight loss.  Excessive thirst.  Light-headedness.  Fruity or sweet-smelling breath.  Excessive urination.  Vision changes.  Confusion or irritability.  Nausea.  Vomiting.  Rapid breathing.  Abdominal pain.  Feeling flushed. How is this diagnosed? This condition is diagnosed based on your medical history, a physical exam, and blood tests. You may also have a urine test to check for ketones. How is this treated? This condition may be treated with:  Fluid replacement. This may be done to correct dehydration.  Insulin injections. These may be given through the skin or through an IV tube.  Electrolyte replacement. Electrolytes are minerals in your blood.  Electrolytes such as potassium and sodium may be given in pill form or through an IV tube.  Antibiotic medicines. These may be prescribed if your condition was caused by an infection. Diabetic ketoacidosis is a serious medical condition. You may need emergency treatment in the hospital to monitor your condition. Follow these instructions at home: Eating and drinking  Drink enough fluids to keep your urine clear or pale yellow.  If you are not able to eat, drink clear fluids in small amounts as you are able. Clear fluids include water, ice chips, fruit juice with water added (diluted), and low-calorie sports drinks. You may  also have sugar-free jello or popsicles.  If you are able to eat, follow your usual diet and drink sugar-free liquids, such as water. Medicines  Take over-the-counter and prescription medicines only as told by your health care provider.  Continue to take insulin and other diabetes medicines as told by your health care provider.  If you were prescribed an antibiotic, take it as told by your health care provider. Do not stop taking the antibiotic even if you start to feel better. General instructions   Check your urine for ketones when you are ill and as told by your health care provider. ? If your blood glucose is 240 mg/dL (13.3 mmol/L) or higher, check your urine ketones every 4-6 hours.  Check your blood glucose every day, as often as told by your health care provider. ? If your blood glucose is high, drink plenty of fluids. This helps to flush out ketones. ? If your blood glucose is above your target for 2 tests in a row, contact your health care provider.  Carry a medical alert card or wear medical alert jewelry that says that you have diabetes.  Rest and exercise only as told by your health care provider. Do not exercise when your blood glucose is high and you have ketones in your urine.  If you get sick, call your health care provider and begin treatment  quickly. Your body often needs extra insulin to fight an illness. Check your blood glucose every 4-6 hours when you are sick.  Keep all follow-up visits as told by your health care provider. This is important. Contact a health care provider if:  Your blood glucose level is higher than 240 mg/dL (13.3 mmol/L) for 2 days in a row.  You have moderate or large ketones in your urine.  You have a fever.  You cannot eat or drink without vomiting.  You have been vomiting for more than 2 hours.  You continue to have symptoms of diabetic ketoacidosis.  You develop new symptoms. Get help right away if:  Your blood glucose monitor reads "high" even when you are taking insulin.  You faint.  You have chest pain.  You have trouble breathing.  You have sudden trouble speaking or swallowing.  You have vomiting or diarrhea that gets worse after 3 hours.  You are unable to stay awake.  You have trouble thinking.  You are severely dehydrated. Symptoms of severe dehydration include: ? Extreme thirst. ? Dry mouth. ? Rapid breathing. These symptoms may represent a serious problem that is an emergency. Do not wait to see if the symptoms will go away. Get medical help right away. Call your local emergency services (911 in the [place] Do not drive yourself to the hospital. Summary  Diabetic ketoacidosis is a serious complication of diabetes. This condition develops when there is not enough insulin in the body.  This condition is diagnosed based on your medical history, a physical exam, and blood tests. You may also have a urine test to check for ketones.  Diabetic ketoacidosis is a serious medical condition. You may need emergency treatment in the hospital to monitor your condition.  Contact your health care provider if your blood glucose is higher than 240 mg/dl for 2 days in a row or if you have moderate or large ketones in your urine. This information is not intended to replace advice given  to you by your health care provider. Make sure you discuss any questions you have with your health care provider.  Document Revised: 11/12/2016 Document Reviewed: 11/01/2016 Elsevier Patient Education  2020 [employer]. Please note that NO REFILLS for any discharge medications will be authorized once you are discharged, as it is imperative that you return to your primary care physician (or establish a relationship with a primary care physician if you do not have one) for your aftercare needs so that they can reassess your need for medications and monitor your lab values.  Please take all your medications with you for your next visit with your Primary MD. Please ask your Primary MD to get all Hospital records sent to his/her office. Please request your Primary MD to go over all hospital test results at the follow up.   If you experience worsening of your admission symptoms, develop shortness of breath, chest pain, suicidal or homicidal thoughts or a life threatening emergency, you must seek medical attention immediately by calling 911 or calling your MD.   Dennis Bast must read the complete instructions/literature along with all the possible adverse reactions/side effects for all the medicines you take including new medications that have been prescribed to you. Take new medicines after you have completely understood and accpet all the possible adverse reactions/side effects.    Do not drive when taking pain medications or sedatives.     Do not take more than prescribed Pain, Sleep and Anxiety Medications   If you have smoked or chewed Tobacco in the last 2 yrs please stop. Stop any regular alcohol  and or recreational drug use.   Wear Seat belts while driving.

## 2020-01-26 NOTE — Progress Notes (Signed)
Patient transferred to [place] via wheelchair.  No issues to report.   This RN gave bedside report to Rosaria Ferries, RN, patient has all personal belongings on him, cell phone and glasses, as well as family is aware of transfer.  MD, [facility], and Charge Nurse aware.

## 2020-01-28 ENCOUNTER — Telehealth: Payer: Self-pay

## 2020-01-28 ENCOUNTER — Ambulatory Visit: Payer: Self-pay

## 2020-01-28 NOTE — Progress Notes (Addendum)
Established patient visit    Patient: Craig [name]   DOB: 1946-05-23   74 y.o. Male  MRN: [id number] Visit Date: 01/29/2020  Today's healthcare provider: Trinna Post, PA-C   Chief Complaint  Patient presents with  . Hospitalization Follow-up  . Diabetes  I,Ronin Rehfeldt M Virgina Deakins,acting as a scribe for [employer], PA-C.,have documented all relevant documentation on the behalf of Trinna Post, PA-C,as directed by  Trinna Post, PA-C while in the presence of Trinna Post, PA-C.  Subjective    HPI Follow up Hospitalization  Patient was admitted to [facility] on 01/24/2020 and discharged on 01/26/2020. He was treated for Diabetic Ketoacidosis. Treatment for this included IVF and Insulin. Telephone follow up was done on 01/27/2020 He reports good compliance with treatment. He reports this condition is improved. Patient states his blood sugar is staying in the low 100's  Patient has been using Lantus 20 units QHS and sliding scale Humalog. He is able to verbalize the scale to me today, patient is low literacy and has difficulty writing down values.  ----------------------------------------------------------------------------------------- -       Medications: Outpatient Medications Prior to Visit  Medication Sig  . aspirin EC 81 MG tablet Take 81 mg by mouth daily.  . carvedilol (COREG) 6.25 MG tablet Take 6.25 mg by mouth 2 (two) times daily with a meal.  . diltiazem (CARDIZEM CD) 300 MG 24 hr capsule TAKE 1 CAPSULE BY MOUTH EVERY DAY  . dorzolamide-timolol (COSOPT) 22.3-6.8 MG/ML ophthalmic solution INSTILL ONE DROP INTO BOTH EYES TWICE DAILY  . ferrous sulfate 325 (65 FE) MG tablet TAKE 1 TABLET BY MOUTH EVERY DAY WITH BREAKFAST  . gabapentin (NEURONTIN) 300 MG capsule TAKE 2 CAPSULES (600 MG TOTAL) BY MOUTH 3 (THREE) TIMES DAILY.  [name] Kitchen ibuprofen (ADVIL) 800 MG tablet Take 1 tablet (800 mg total) by mouth every 8 (eight) hours as needed.  . insulin glargine (LANTUS  SOLOSTAR) 100 UNIT/ML Solostar Pen Inject 20 Units into the skin at bedtime.  . insulin lispro (HUMALOG) 100 UNIT/ML KwikPen Inject 0.05 mLs (5 Units total) into the skin 3 (three) times daily with meals.  . latanoprost (XALATAN) 0.005 % ophthalmic solution Place 1 drop into both eyes at bedtime.   . lidocaine-prilocaine (EMLA) cream APPLY TO AFFECTED AREA ONCE AS DIRECTED  . loperamide (IMODIUM A-D) 2 MG tablet Take 1 tablet (2 mg total) by mouth See admin instructions. Take 2 at diarrhea onset , then 1 every 2hr until 12hrs with no BM. May take 2 every 4hrs at night. If diarrhea recurs repeat.  . Multiple Vitamin (MULTIVITAMIN WITH MINERALS) TABS tablet Take 1 tablet by mouth daily.  . OMEGA-3 FATTY ACIDS PO Take 1,200 mg by mouth daily.   [name] Kitchen omeprazole (PRILOSEC) 40 MG capsule TAKE 1 CAPSULE BY MOUTH EVERY DAY  . oxyCODONE (OXY IR/ROXICODONE) 5 MG immediate release tablet Take 1 tablet (5 mg total) by mouth every 6 (six) hours as needed for severe pain.  . sitaGLIPtin (JANUVIA) 100 MG tablet Take 100 mg by mouth daily.  . Insulin Pen Needle (NOVOFINE) 30G X 8 MM MISC Inject 10 each into the skin 4 (four) times daily. (Patient not taking: Reported on 01/29/2020)  . Insulin Pen Needle (NOVOFINE) 32G X 6 MM MISC Use to inject basal insulin once before bed. (Patient not taking: Reported on 01/29/2020)   Facility-Administered Medications Prior to Visit  Medication Dose Route Frequency Provider  . sodium chloride flush (NS) 0.9 % injection 10  mL  10 mL Intracatheter PRN Earlie Server, MD  . sodium chloride flush (NS) 0.9 % injection 10 mL  10 mL Intravenous Once Earlie Server, MD    Review of Systems  Constitutional: Negative.   HENT: Negative.   Eyes: Negative.   Respiratory: Negative.   Cardiovascular: Negative.   Gastrointestinal: Negative.   Endocrine: Negative.   Genitourinary: Negative.   Musculoskeletal: Negative.   Skin: Negative.   Allergic/Immunologic: Negative.   Neurological: Negative.     Hematological: Negative.   Psychiatric/Behavioral: Negative.        Objective    There were no vitals taken for this visit.   Physical Exam Constitutional:      Appearance: Normal appearance.  Cardiovascular:     Rate and Rhythm: Normal rate and regular rhythm.     Pulses: Normal pulses.     Heart sounds: Normal heart sounds.  Pulmonary:     Effort: Pulmonary effort is normal.     Breath sounds: Normal breath sounds.  Skin:    General: Skin is warm and dry.  Neurological:     General: No focal deficit present.     Mental Status: He is alert and oriented to person, place, and time.  Psychiatric:        Mood and Affect: Mood normal.        Behavior: Behavior normal.       No results found for any visits on 01/29/20.   Assessment & Plan    1. Uncontrolled type 2 diabetes mellitus with hyperglycemia ([place]) Patient was advised to stop taking the Januvia. Will continue with lantus 20units QHS. Giving sliding scale for Humalog. Question if diabetes is from [facility], pancreatic cancer or metabolic syndrome. Educated on how to treat a low blood sugar. Will referral urgently to endocrinology. In the meantime will order the freestyle libre to better monitor blood sugar, he should be checking them fasting before meals and before bed.  - Continuous Blood Gluc Receiver (FREESTYLE LIBRE 14 DAY READER) [place]; 1 Device by Does not apply route every 14 (fourteen) days.  Dispense: 6 each; Refill: 1 - CBC with Differential/Platelet - Comprehensive metabolic panel - C-peptide - Ambulatory referral to Endocrinology - Ambulatory referral to Home Health  2. Pancreatic cancer metastasized to liver [facility])  - Ambulatory referral to Endocrinology - Ambulatory referral to [place]     Return in about 2 weeks (around 02/12/2020).      ITrinna Post, PA-C, have reviewed all documentation for this visit. The documentation on 01/29/20 for the exam, diagnosis, procedures, and orders are  all accurate and complete.    Paulene Floor  [facility] [phone] (phone) [phone] (fax)  [place]

## 2020-01-28 NOTE — Chronic Care Management (AMB) (Signed)
  Chronic Care Management   Note  01/28/2020 Name: [name] MRN: [id number] DOB: 08/13/1946    Received MD request to follow up Mr. Smisek today regarding recent changes to his prescribed insulin regimen. Mr. Steeb was evaluated in the Emergency room and admitted following the clinic visit on 01/24/20. He was discharged on 01/26/20.   Current insulin regimen reviewed. Mr. Cannady reports taking 5 units of Humalog insulin with meals three times a day as prescribed and increasing Lantus dose to 20 units at bedtime. His spouse Enid Derry confirmed. She read back each label to confirm that he was taking the correct dose from the correct insulin pen.  Mr. Rattler reports feeling much better today. Denies complications or worsening symptoms since hospital discharge. He reports taking the morning dose of Humalog at 7am today. Reports blood glucose reading was 228mg /dl. Reports taking afternoon dose at 2pm with a blood glucose reading of 240mg /dl. He verbalized need to take evening and bedtime dose as prescribed. He agreed to record all readings. He will complete a post hospitalization follow-up with his primary care provider on tomorrow.   Follow up plan: The care management team will follow up with Mr. Almasri again this week.   [name] [facility] Management [phone]

## 2020-01-28 NOTE — Telephone Encounter (Signed)
HFU apt not scheduled. A 1 month diabetes f/u is scheduled for 02/08/20.

## 2020-01-28 NOTE — Progress Notes (Signed)

## 2020-01-28 NOTE — Telephone Encounter (Signed)
Transition Care Management Follow-up Telephone Call  Date of discharge and from where: [facility] on 01/26/20   How have you been since you were released from the hospital? Feeling better, blood sugar readings are up and down. This morning at 7:00 AM it was 228. Declines fever, fatigue, excessive thirst or urination, pain or n/v/d.  Any questions or concerns? Yes, pt would like a different glucometer.   Items Reviewed:  Did the pt receive and understand the discharge instructions provided? Yes   Medications obtained and verified? No, pt declined reveiwing over the phone. Pt to verify these at in office apt.   Any new allergies since your discharge? No   Dietary orders reviewed? Yes  Do you have support at home? Yes   Other (ie: DME, Home Health, etc): New glucometer.  Functional Questionnaire: (I = Independent and D = Dependent)  Bathing/Dressing- I   Meal Prep- I  Eating- I  Maintaining continence- I  Transferring/Ambulation- I  Managing Meds- I   Follow up appointments reviewed:    PCP Hospital f/u appt confirmed? Yes , scheduled to see Carles Collet on 01/29/20 @ 2:40.  [facility] f/u appt confirmed? N/A   Are transportation arrangements needed? No   If their condition worsens, is the pt aware to call  their PCP or go to the ED? Yes  Was the patient provided with contact information for the PCP's office or ED? Yes  Was the pt encouraged to call back with questions or concerns? Yes

## 2020-01-29 ENCOUNTER — Telehealth: Payer: Self-pay

## 2020-01-29 ENCOUNTER — Other Ambulatory Visit: Payer: Self-pay

## 2020-01-29 ENCOUNTER — Ambulatory Visit: Payer: Self-pay

## 2020-01-29 ENCOUNTER — Ambulatory Visit (INDEPENDENT_AMBULATORY_CARE_PROVIDER_SITE_OTHER): Payer: Medicare Other | Admitting: Physician Assistant

## 2020-01-29 DIAGNOSIS — C787 Secondary malignant neoplasm of liver and intrahepatic bile duct: Secondary | ICD-10-CM

## 2020-01-29 DIAGNOSIS — E1165 Type 2 diabetes mellitus with hyperglycemia: Secondary | ICD-10-CM | POA: Diagnosis not present

## 2020-01-29 DIAGNOSIS — C259 Malignant neoplasm of pancreas, unspecified: Secondary | ICD-10-CM

## 2020-01-29 MED ORDER — FREESTYLE LIBRE 14 DAY READER DEVI: 1.0000 | 1 refills | Status: DC

## 2020-01-29 NOTE — Patient Instructions (Addendum)
Stop Januvia

## 2020-01-30 ENCOUNTER — Other Ambulatory Visit: Payer: Self-pay | Admitting: Physician Assistant

## 2020-01-30 ENCOUNTER — Telehealth: Payer: Self-pay

## 2020-01-30 DIAGNOSIS — E1165 Type 2 diabetes mellitus with hyperglycemia: Secondary | ICD-10-CM

## 2020-01-30 LAB — CBC WITH DIFFERENTIAL/PLATELET
Basophils Absolute: 0 10*3/uL (ref 0.0–0.2)
Basos: 0 %
EOS (ABSOLUTE): 0.2 10*3/uL (ref 0.0–0.4)
Eos: 2 %
Hematocrit: 35.8 % — ABNORMAL LOW (ref 37.5–51.0)
Hemoglobin: 11.9 g/dL — ABNORMAL LOW (ref 13.0–17.7)
Immature Grans (Abs): 0.1 10*3/uL (ref 0.0–0.1)
Immature Granulocytes: 1 %
Lymphocytes Absolute: 1.3 10*3/uL (ref 0.7–3.1)
Lymphs: 18 %
MCH: 28.6 pg (ref 26.6–33.0)
MCHC: 33.2 g/dL (ref 31.5–35.7)
MCV: 86 fL (ref 79–97)
Monocytes Absolute: 0.5 10*3/uL (ref 0.1–0.9)
Monocytes: 7 %
Neutrophils Absolute: 5 10*3/uL (ref 1.4–7.0)
Neutrophils: 72 %
Platelets: 187 10*3/uL (ref 150–450)
RBC: 4.16 x10E6/uL (ref 4.14–5.80)
RDW: 14.5 % (ref 11.6–15.4)
WBC: 7.1 10*3/uL (ref 3.4–10.8)

## 2020-01-30 LAB — COMPREHENSIVE METABOLIC PANEL
ALT: 79 IU/L — ABNORMAL HIGH (ref 0–44)
AST: 57 IU/L — ABNORMAL HIGH (ref 0–40)
Albumin/Globulin Ratio: 1.4 (ref 1.2–2.2)
Albumin: 3.6 g/dL — ABNORMAL LOW (ref 3.7–4.7)
Alkaline Phosphatase: 98 IU/L (ref 39–117)
BUN/Creatinine Ratio: 19 (ref 10–24)
BUN: 20 mg/dL (ref 8–27)
Bilirubin Total: 0.5 mg/dL (ref 0.0–1.2)
CO2: 19 mmol/L — ABNORMAL LOW (ref 20–29)
Calcium: 9.1 mg/dL (ref 8.6–10.2)
Chloride: 103 mmol/L (ref 96–106)
Creatinine, Ser: 1.08 mg/dL (ref 0.76–1.27)
GFR calc Af Amer: 78 mL/min/{1.73_m2} (ref >59)
GFR calc non Af Amer: 68 mL/min/{1.73_m2} (ref >59)
Globulin, Total: 2.5 g/dL (ref 1.5–4.5)
Glucose: 146 mg/dL — ABNORMAL HIGH (ref 65–99)
Potassium: 4.5 mmol/L (ref 3.5–5.2)
Sodium: 137 mmol/L (ref 134–144)
Total Protein: 6.1 g/dL (ref 6.0–8.5)

## 2020-01-30 LAB — C-PEPTIDE: C-Peptide: 1.3 ng/mL (ref 1.1–4.4)

## 2020-01-30 MED ORDER — PEN NEEDLES 32G X 5 MM MISC: 1 refills | Status: DC

## 2020-01-30 NOTE — Telephone Encounter (Signed)
Called and spoke with the patient about his lab results. He has an appointment with CCM but no appointment with endocrinology as of yet. He gave verbal understanding.

## 2020-01-30 NOTE — Telephone Encounter (Signed)
Spoke with Leanna Sato w/ the speciality pharmacy ([place]) [phone] and he states that the [facility] will be send to patient for 3 month and on April 30, 2020 patient would have to request more supplies. Leanna Sato states that they will be faxing documents for you to fill out for patient as well.FYI

## 2020-01-30 NOTE — Telephone Encounter (Signed)
   Notes to clinic:  comment: Alternative Requested:NEED TO CALL SPECIALTY PHARMACY AT 1 [phone].   Requested Prescriptions  Pending Prescriptions Disp Refills   Continuous Blood Gluc Receiver (FREESTYLE LIBRE 69 DAY READER) [place] [Pharmacy Med Name: FREESTYLE LIBRE 14 DAY READER]  0    Sig: Please specify directions, refills and quantity      Endocrinology: Diabetes - Testing Supplies Passed - 01/30/2020 10:41 AM      Passed - Valid encounter within last 12 months    Recent Outpatient Visits           Yesterday Uncontrolled type 2 diabetes mellitus with hyperglycemia [facility])   [place], Adriana M, PA-C   6 days ago Diabetes mellitus without complication [facility])   [place], [place], PA-C   1 week ago Uncontrolled type 2 diabetes mellitus with hyperglycemia [facility])   [place], [place], [place]   1 week ago Hyperglycemia   [place], [place], PA-C   3 weeks ago Diabetes mellitus due to underlying condition with hyperglycemia, without long-term current use of insulin [facility])   [place], Wendee Beavers, [place]       Future Appointments             In 1 week Trinna Post, [place], [place]   In 1 week Trinna Post, PA-C [employer], [facility]

## 2020-01-30 NOTE — Addendum Note (Signed)
Addended by: Trinna Post on: 01/30/2020 02:49 PM   Modules accepted: Orders

## 2020-01-31 ENCOUNTER — Telehealth: Payer: Self-pay

## 2020-01-31 ENCOUNTER — Telehealth: Payer: Self-pay | Admitting: Physician Assistant

## 2020-01-31 NOTE — Telephone Encounter (Signed)
[name] from [place] states services will begin until 02/04/20.He wants to make sure you are ok with this

## 2020-02-01 NOTE — Telephone Encounter (Signed)
I was gong to let Craig [name] from [place] know that PA approves, I did not see a call back number for [facility]. KW

## 2020-02-01 NOTE — Telephone Encounter (Signed)
Yes, that's OK 

## 2020-02-04 ENCOUNTER — Other Ambulatory Visit: Payer: Self-pay

## 2020-02-04 ENCOUNTER — Inpatient Hospital Stay (HOSPITAL_BASED_OUTPATIENT_CLINIC_OR_DEPARTMENT_OTHER): Payer: Medicare Other | Admitting: Oncology

## 2020-02-04 ENCOUNTER — Inpatient Hospital Stay: Payer: Medicare Other

## 2020-02-04 ENCOUNTER — Encounter: Payer: Self-pay | Admitting: Oncology

## 2020-02-04 VITALS — BP 110/68 | HR 74 | Temp 97.1°F | Resp 18 | Wt 168.5 lb

## 2020-02-04 DIAGNOSIS — N281 Cyst of kidney, acquired: Secondary | ICD-10-CM | POA: Diagnosis not present

## 2020-02-04 DIAGNOSIS — C771 Secondary and unspecified malignant neoplasm of intrathoracic lymph nodes: Secondary | ICD-10-CM | POA: Diagnosis not present

## 2020-02-04 DIAGNOSIS — R739 Hyperglycemia, unspecified: Secondary | ICD-10-CM

## 2020-02-04 DIAGNOSIS — Z888 Allergy status to other drugs, medicaments and biological substances status: Secondary | ICD-10-CM | POA: Diagnosis not present

## 2020-02-04 DIAGNOSIS — C787 Secondary malignant neoplasm of liver and intrahepatic bile duct: Secondary | ICD-10-CM | POA: Diagnosis not present

## 2020-02-04 DIAGNOSIS — T451X5A Adverse effect of antineoplastic and immunosuppressive drugs, initial encounter: Secondary | ICD-10-CM | POA: Diagnosis not present

## 2020-02-04 DIAGNOSIS — Z87891 Personal history of nicotine dependence: Secondary | ICD-10-CM | POA: Diagnosis not present

## 2020-02-04 DIAGNOSIS — Z79899 Other long term (current) drug therapy: Secondary | ICD-10-CM | POA: Diagnosis not present

## 2020-02-04 DIAGNOSIS — C7951 Secondary malignant neoplasm of bone: Secondary | ICD-10-CM | POA: Diagnosis not present

## 2020-02-04 DIAGNOSIS — I2510 Atherosclerotic heart disease of native coronary artery without angina pectoris: Secondary | ICD-10-CM | POA: Diagnosis not present

## 2020-02-04 DIAGNOSIS — D631 Anemia in chronic kidney disease: Secondary | ICD-10-CM | POA: Diagnosis not present

## 2020-02-04 DIAGNOSIS — E876 Hypokalemia: Secondary | ICD-10-CM

## 2020-02-04 DIAGNOSIS — K219 Gastro-esophageal reflux disease without esophagitis: Secondary | ICD-10-CM | POA: Diagnosis not present

## 2020-02-04 DIAGNOSIS — C259 Malignant neoplasm of pancreas, unspecified: Secondary | ICD-10-CM

## 2020-02-04 DIAGNOSIS — M8580 Other specified disorders of bone density and structure, unspecified site: Secondary | ICD-10-CM | POA: Diagnosis not present

## 2020-02-04 DIAGNOSIS — Z9049 Acquired absence of other specified parts of digestive tract: Secondary | ICD-10-CM | POA: Diagnosis not present

## 2020-02-04 DIAGNOSIS — Z5111 Encounter for antineoplastic chemotherapy: Secondary | ICD-10-CM

## 2020-02-04 DIAGNOSIS — E042 Nontoxic multinodular goiter: Secondary | ICD-10-CM | POA: Diagnosis not present

## 2020-02-04 DIAGNOSIS — R5383 Other fatigue: Secondary | ICD-10-CM | POA: Diagnosis not present

## 2020-02-04 DIAGNOSIS — E1110 Type 2 diabetes mellitus with ketoacidosis without coma: Secondary | ICD-10-CM | POA: Diagnosis not present

## 2020-02-04 DIAGNOSIS — Z794 Long term (current) use of insulin: Secondary | ICD-10-CM | POA: Diagnosis not present

## 2020-02-04 DIAGNOSIS — C7800 Secondary malignant neoplasm of unspecified lung: Secondary | ICD-10-CM | POA: Diagnosis not present

## 2020-02-04 DIAGNOSIS — K760 Fatty (change of) liver, not elsewhere classified: Secondary | ICD-10-CM | POA: Diagnosis not present

## 2020-02-04 DIAGNOSIS — C257 Malignant neoplasm of other parts of pancreas: Secondary | ICD-10-CM | POA: Diagnosis present

## 2020-02-04 DIAGNOSIS — I10 Essential (primary) hypertension: Secondary | ICD-10-CM | POA: Diagnosis not present

## 2020-02-04 DIAGNOSIS — Z808 Family history of malignant neoplasm of other organs or systems: Secondary | ICD-10-CM | POA: Diagnosis not present

## 2020-02-04 LAB — CBC WITH DIFFERENTIAL/PLATELET
Abs Immature Granulocytes: 0.03 10*3/uL (ref 0.00–0.07)
Basophils Absolute: 0 10*3/uL (ref 0.0–0.1)
Basophils Relative: 1 %
Eosinophils Absolute: 0.1 10*3/uL (ref 0.0–0.5)
Eosinophils Relative: 2 %
HCT: 32.6 % — ABNORMAL LOW (ref 39.0–52.0)
Hemoglobin: 10.2 g/dL — ABNORMAL LOW (ref 13.0–17.0)
Immature Granulocytes: 1 %
Lymphocytes Relative: 14 %
Lymphs Abs: 0.8 10*3/uL (ref 0.7–4.0)
MCH: 28.4 pg (ref 26.0–34.0)
MCHC: 31.3 g/dL (ref 30.0–36.0)
MCV: 90.8 fL (ref 80.0–100.0)
Monocytes Absolute: 0.6 10*3/uL (ref 0.1–1.0)
Monocytes Relative: 11 %
Neutro Abs: 4.3 10*3/uL (ref 1.7–7.7)
Neutrophils Relative %: 71 %
Platelets: 219 10*3/uL (ref 150–400)
RBC: 3.59 MIL/uL — ABNORMAL LOW (ref 4.22–5.81)
RDW: 15.8 % — ABNORMAL HIGH (ref 11.5–15.5)
WBC: 6 10*3/uL (ref 4.0–10.5)
nRBC: 0 % (ref 0.0–0.2)

## 2020-02-04 LAB — COMPREHENSIVE METABOLIC PANEL
ALT: 63 U/L — ABNORMAL HIGH (ref 0–44)
AST: 35 U/L (ref 15–41)
Albumin: 3.2 g/dL — ABNORMAL LOW (ref 3.5–5.0)
Alkaline Phosphatase: 81 U/L (ref 38–126)
Anion gap: 11 (ref 5–15)
BUN: 10 mg/dL (ref 8–23)
CO2: 24 mmol/L (ref 22–32)
Calcium: 8.5 mg/dL — ABNORMAL LOW (ref 8.9–10.3)
Chloride: 106 mmol/L (ref 98–111)
Creatinine, Ser: 1.01 mg/dL (ref 0.61–1.24)
GFR calc Af Amer: >60 mL/min (ref >60)
GFR calc non Af Amer: >60 mL/min (ref >60)
Glucose, Bld: 133 mg/dL — ABNORMAL HIGH (ref 70–99)
Potassium: 3.1 mmol/L — ABNORMAL LOW (ref 3.5–5.1)
Sodium: 141 mmol/L (ref 135–145)
Total Bilirubin: 0.8 mg/dL (ref 0.3–1.2)
Total Protein: 6.4 g/dL — ABNORMAL LOW (ref 6.5–8.1)

## 2020-02-04 MED ORDER — POTASSIUM CHLORIDE CRYS ER 20 MEQ PO TBCR: 20.0000 meq | EXTENDED_RELEASE_TABLET | Freq: Every day | ORAL | 0 refills | Status: DC

## 2020-02-04 MED ORDER — SODIUM CHLORIDE 0.9 % IV SOLN
10.0000 mg | Freq: Once | INTRAVENOUS | Status: AC
  Administered 2020-02-04: 10 mg via INTRAVENOUS
  Filled 2020-02-04: qty 10

## 2020-02-04 MED ORDER — SODIUM CHLORIDE 0.9% FLUSH
10.0000 mL | Freq: Once | INTRAVENOUS | Status: AC
  Administered 2020-02-04: 09:00:00 10 mL via INTRAVENOUS
  Filled 2020-02-04: qty 10

## 2020-02-04 MED ORDER — SODIUM CHLORIDE 0.9 % IV SOLN
421.0000 mg/m2 | Freq: Once | INTRAVENOUS | Status: AC
  Administered 2020-02-04: 800 mg via INTRAVENOUS
  Filled 2020-02-04: qty 40

## 2020-02-04 MED ORDER — SODIUM CHLORIDE 0.9 % IV SOLN
Freq: Once | INTRAVENOUS | Status: AC
  Filled 2020-02-04: qty 250

## 2020-02-04 MED ORDER — PALONOSETRON HCL INJECTION 0.25 MG/5ML
0.2500 mg | Freq: Once | INTRAVENOUS | Status: AC
  Administered 2020-02-04: 0.25 mg via INTRAVENOUS
  Filled 2020-02-04: qty 5

## 2020-02-04 MED ORDER — SODIUM CHLORIDE 0.9 % IV SOLN
2400.0000 mg/m2 | INTRAVENOUS | Status: DC
  Administered 2020-02-04: 4550 mg via INTRAVENOUS
  Filled 2020-02-04: qty 91

## 2020-02-04 MED ORDER — SODIUM CHLORIDE 0.9 % IV SOLN
67.9000 mg/m2 | Freq: Once | INTRAVENOUS | Status: AC
  Administered 2020-02-04: 129 mg via INTRAVENOUS
  Filled 2020-02-04: qty 30

## 2020-02-04 NOTE — Progress Notes (Signed)
Patient here for follow up. Patient has started insulin. Blood sugar this morning was 147, per pt. Pt reports not being able to see without his glasses, he believes it is related to diabetes

## 2020-02-04 NOTE — Progress Notes (Signed)
Hematology/Oncology  Follow up note [facility] [phone]) [phone] [phone]   Patient Care Team: Paulene Floor as PCP - General (Physician Assistant) Clent Jacks, RN as Oncology Nurse Navigator Earlie Server, MD as Consulting Physician (Hematology and Oncology) Neldon Labella, RN as Case Manager  REFERRING PROVIDER: Trinna Post, PA-C  CHIEF COMPLAINTS/REASON FOR VISIT:  Follow up for treatment of pancreatic cancer  HISTORY OF PRESENTING ILLNESS:   Craig [name] is a  74 y.o.  male with PMH listed below, including CABG x5, PVD, hypertension, former tobacco abuse, former alcohol abuse, iron deficiency anemia, was seen in consultation at the request of  Terrilee Croak, Adriana M, PA-C  for evaluation of abnormal CT Patient recently presented to primary care provider complaining for abdominal pain radiating to his back for about 10 days.  Patient uses Tylenol as needed for pain. Work-up showed elevated amylase, lipase, mild anemia with hemoglobin of 11.3 Acute hepatitis panel negative. 05/16/2019 CT abdomen pelvis with contrast showed poorly marginated heterogeneous hypodense 2.9 cm pancreatic mass at the uncinate process, worrisome for primary pancreatic cancer.  No biliary or pancreatic duct dilation. Several ill defined hypodense liver masses scattered throughout the liver, largest 3.1 cm in the segment 6 right liver lobe. Nonspecific portacaval and aortocaval adenopathy. Extreme medial left lung base 4.4 cm pleural-based mass probably a pleural metastasis.  Additional tiny pulmonary nodules scattered at the right lung base are indeterminate. Mild prostate or megaly  Patient was referred to heme-onc for further evaluation. Patient reports that his abdominal pain was 10 out of 10 a few days ago, he uses Tylenol as needed. Today his pain is getting better, 2 out of 10.  Denies any nausea, vomiting, diarrhea, fever or chills. Married, lives  with wife.  Appetite is good.  He weighs 195 pounds today at the clinic. His weight was 204 pounds back in May 2020.  He had a history of 37.5-pack-year smoking history quitting 2018. No longer drinking alcohol.  # ultrasound-guided liver biopsy on 05/21/2019.  Pathology showed fragments of benign hepatic parenchyma showing minimal macro vascular steatosis, otherwise no significant histo pathologic change. Single core of renal tissue showing approximately 25% glomerulosclerosis  # #History of CAD, status post CABG x5.  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  # Omniseq test showed PD-L1 50% TPS, TMB high, negative for BRCA1/2 or NTRK fusion.  MSI could not be completed.  Patient declines genetic testing  INTERVAL HISTORY Craig [name] is a 74 y.o. male who has above history reviewed by me today presents for evaluation of chemotherapy tolerability for metastatic pancreatic cancer.   Problems and complaints are listed below: He is on third line chemotherapy treatment 5-FU with liposomal irinotecan. During the interval 01/24/2020-01/26/2020 patient was admitted to ICU due to DKA. Is on long-acting insulin 20 units at night and takes sliding scale with meals. Today patient reports no nausea or vomiting. He denies any pain today. Appetite is fair.   . Review of Systems  Constitutional: Positive for appetite change and fatigue. Negative for chills, fever and unexpected weight change.  HENT:   Negative for hearing loss and voice change.   Eyes: Negative for eye problems and icterus.  Respiratory: Negative for chest tightness, cough and shortness of breath.   Cardiovascular: Negative for chest pain and leg swelling.  Gastrointestinal: Negative for abdominal distention and abdominal pain.  Endocrine:  Negative for hot flashes.  Genitourinary: Positive for frequency.  Negative for difficulty urinating and dysuria.   Musculoskeletal: Negative for arthralgias.  Skin: Negative for itching and rash.  Neurological: Positive for numbness. Negative for light-headedness.  Hematological: Negative for adenopathy. Does not bruise/bleed easily.  Psychiatric/Behavioral: Negative for confusion.    MEDICAL HISTORY:  Past Medical History:  Diagnosis Date  . Allergic rhinitis   . Cancer [facility])    new dx Pancreatic Cancer - Aug 2020  . CHF (congestive heart failure) ([place])   . Chronic cholecystitis   . Coronary artery disease   . DDD (degenerative disc disease), cervical   . Dehydration 06/21/2019  . Diabetes mellitus without complication ([place])   . Dyspnea   . Early cataract   . Erectile dysfunction   . GERD (gastroesophageal reflux disease)   . Gouty arthritis   . Headache    occasional migraines  . Hypercalcemia   . Hyperlipemia   . Hypertension   . Palpitations   . Peripheral vascular disease ([place])   . 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   LIMA to LAD SVG to [place] SVG SEQUENTIALLY to OM1 and OM2 SVG to ACUTE MARGINAL  . Spinal stenosis of cervical region   . Vitamin D deficiency     SURGICAL HISTORY: Past Surgical History:  Procedure Laterality Date  . BACK SURGERY  2018    fusion with screws  . CHOLECYSTECTOMY N/A 11/13/2018   Procedure: LAPAROSCOPIC CHOLECYSTECTOMY;  Surgeon: Vickie Epley, MD;  Location: [facility] ORS;  Service: General;  Laterality: N/A;  . COLONOSCOPY    . CORONARY ARTERY BYPASS GRAFT N/A 09/30/2017   Procedure: CORONARY ARTERY BYPASS GRAFTING times five using right and left Saphaneous vein harvested endoscopicly  and left internal mammary artery. (CABG),TEE;  Surgeon: Rexene Alberts, MD;  Location: [place];  Service: Open Heart Surgery;  Laterality: N/A;  . LEFT HEART CATH AND CORONARY ANGIOGRAPHY Left 09/06/2017   Procedure: LEFT HEART CATH AND CORONARY ANGIOGRAPHY;  Surgeon: Corey Skains, MD;  Location: [place] [facility];  Service: Cardiovascular;  Laterality: Left;  . percutaneous transluminal balloon angioplasty  01/2010   of left lower extremity  . PORTA CATH INSERTION N/A 06/06/2019   Procedure: PORTA CATH INSERTION;  Surgeon: Katha Cabal, MD;  Location: [place] [facility];  Service: Cardiovascular;  Laterality: N/A;  . TEE WITHOUT CARDIOVERSION N/A 09/30/2017   Procedure: TRANSESOPHAGEAL ECHOCARDIOGRAM (TEE);  Surgeon: Rexene Alberts, MD;  Location: [place];  Service: Open Heart Surgery;  Laterality: N/A;  . VASCULAR SURGERY Left 2010   left exernal iliac and superficial femoral artery PTCA and stenting    SOCIAL HISTORY: Social History   Socioeconomic History  . Marital status: Married    Spouse name: Enid Derry  . Number of children: 2  . Years of education: Not on file  . Highest education level: Not on file  Occupational History  . Occupation: drove tractors    Comment: retired  Tobacco Use  . Smoking status: Former Smoker    Packs/day: 0.75    Years: 50.00    Pack years: 37.50    Types: Cigarettes    Quit date: 09/27/2017    Years since quitting: 2.3  . Smokeless tobacco: Former Systems developer    Types: Chew  Substance and Sexual Activity  . Alcohol use: Not Currently    Comment: stopped drinking before cabg  . Drug use: Not Currently    Types: Marijuana    Comment:  stopped smoking before CABG  . Sexual activity: Not Currently  Other Topics Concern  . Not on file  Social History Narrative  . Not on file   Social Determinants of Health   Financial Resource Strain: Low Risk   . Difficulty of Paying Living Expenses: Not hard at all  Food Insecurity: No Food Insecurity  . Worried About Charity fundraiser in the Last Year: Never true  . Ran Out of Food in the Last Year: Never true  Transportation Needs: No Transportation Needs  . Lack of Transportation (Medical): No  . Lack of Transportation (Non-Medical): No  Physical Activity: Sufficiently Active  . Days of Exercise per  Week: 5 days  . Minutes of Exercise per Session: 150+ min  Stress: No Stress Concern Present  . Feeling of Stress : Not at all  Social Connections: Somewhat Isolated  . Frequency of Communication with Friends and Family: More than three times a week  . Frequency of Social Gatherings with Friends and Family: More than three times a week  . Attends Religious Services: Never  . Active Member of Clubs or Organizations: No  . Attends Archivist Meetings: Never  . Marital Status: Married  Human resources officer Violence:   . Fear of Current or Ex-Partner:   . Emotionally Abused:   [name] Kitchen Physically Abused:   . Sexually Abused:     FAMILY HISTORY: Family History  Problem Relation Age of Onset  . Brain cancer Mother   . Emphysema Father   . Cancer Brother     ALLERGIES:  is allergic to lipitor [atorvastatin]; zetia [ezetimibe]; lisinopril; protonix [pantoprazole]; and spironolactone.  MEDICATIONS:  Current Outpatient Medications  Medication Sig Dispense Refill  . aspirin EC 81 MG tablet Take 81 mg by mouth daily.    . carvedilol (COREG) 6.25 MG tablet Take 6.25 mg by mouth 2 (two) times daily with a meal.  3  . Continuous Blood Gluc Receiver (FREESTYLE LIBRE 14 DAY READER) DEVI 1 Device by Does not apply route every 14 (fourteen) days. 6 each 1  . diltiazem (CARDIZEM CD) 300 MG 24 hr capsule TAKE 1 CAPSULE BY MOUTH EVERY DAY 90 capsule 1  . dorzolamide-timolol (COSOPT) 22.3-6.8 MG/ML ophthalmic solution INSTILL ONE DROP INTO BOTH EYES TWICE DAILY    . ferrous sulfate 325 (65 FE) MG tablet TAKE 1 TABLET BY MOUTH EVERY DAY WITH BREAKFAST 90 tablet 1  . gabapentin (NEURONTIN) 300 MG capsule TAKE 2 CAPSULES (600 MG TOTAL) BY MOUTH 3 (THREE) TIMES DAILY. 540 capsule 0  . ibuprofen (ADVIL) 800 MG tablet Take 1 tablet (800 mg total) by mouth every 8 (eight) hours as needed. 30 tablet 0  . insulin glargine (LANTUS SOLOSTAR) 100 UNIT/ML Solostar Pen Inject 20 Units into the skin at bedtime. 15  mL 0  . insulin lispro (HUMALOG) 100 UNIT/ML KwikPen Inject 0.05 mLs (5 Units total) into the skin 3 (three) times daily with meals. 15 mL 11  . Insulin Pen Needle (PEN NEEDLES) 32G X 5 MM MISC Use four times daily for insulin 400 each 1  . latanoprost (XALATAN) 0.005 % ophthalmic solution Place 1 drop into both eyes at bedtime.   3  . lidocaine-prilocaine (EMLA) cream APPLY TO AFFECTED AREA ONCE AS DIRECTED    . loperamide (IMODIUM A-D) 2 MG tablet Take 1 tablet (2 mg total) by mouth See admin instructions. Take 2 at diarrhea onset , then 1 every 2hr until 12hrs with no BM. May take 2 every  4hrs at night. If diarrhea recurs repeat. 100 tablet 1  . Multiple Vitamin (MULTIVITAMIN WITH MINERALS) TABS tablet Take 1 tablet by mouth daily.    . OMEGA-3 FATTY ACIDS PO Take 1,200 mg by mouth daily.     [name] Kitchen omeprazole (PRILOSEC) 40 MG capsule TAKE 1 CAPSULE BY MOUTH EVERY DAY 90 capsule 1  . oxyCODONE (OXY IR/ROXICODONE) 5 MG immediate release tablet Take 1 tablet (5 mg total) by mouth every 6 (six) hours as needed for severe pain. 30 tablet 0  . potassium chloride SA (KLOR-CON) 20 MEQ tablet Take 1 tablet (20 mEq total) by mouth daily. 7 tablet 0  . sitaGLIPtin (JANUVIA) 100 MG tablet Take 100 mg by mouth daily.     No current facility-administered medications for this visit.   Facility-Administered Medications Ordered in Other Visits  Medication Dose Route Frequency Provider Last Rate Last Admin  . sodium chloride flush (NS) 0.9 % injection 10 mL  10 mL Intracatheter PRN Earlie Server, MD      . sodium chloride flush (NS) 0.9 % injection 10 mL  10 mL Intravenous Once Earlie Server, MD         PHYSICAL EXAMINATION: ECOG PERFORMANCE STATUS: 1 - Symptomatic but completely ambulatory Vitals:   02/04/20 0901  BP: 110/68  Pulse: 74  Resp: 18  Temp: (!) 97.1 F (36.2 C)  SpO2: 95%   Filed Weights   02/04/20 0901  Weight: 168 lb 8 oz (76.4 kg)    Physical Exam Constitutional:      General: He is not in  acute distress. HENT:     Head: Normocephalic and atraumatic.  Eyes:     General: No scleral icterus. Cardiovascular:     Rate and Rhythm: Normal rate and regular rhythm.     Heart sounds: Normal heart sounds.  Pulmonary:     Effort: Pulmonary effort is normal. No respiratory distress.     Breath sounds: No wheezing.  Abdominal:     General: Bowel sounds are normal. There is no distension.     Palpations: Abdomen is soft.  Musculoskeletal:        General: No deformity. Normal range of motion.     Cervical back: Normal range of motion and neck supple.  Skin:    General: Skin is warm and dry.     Findings: No erythema or rash.  Neurological:     Mental Status: He is alert and oriented to person, place, and time. Mental status is at baseline.     Cranial Nerves: No cranial nerve deficit.     Coordination: Coordination normal.  Psychiatric:        Mood and Affect: Mood normal.     LABORATORY DATA:  I have reviewed the data as listed Lab Results  Component Value Date   WBC 6.0 02/04/2020   HGB 10.2 (L) 02/04/2020   HCT 32.6 (L) 02/04/2020   MCV 90.8 02/04/2020   PLT 219 02/04/2020   Recent Labs    01/21/20 0842 01/24/20 1304 01/25/20 1001 01/29/20 1429 02/04/20 0837  NA 134*   < > 135 137 141  K 4.1   < > 4.4 4.5 3.1*  CL 101   < > 103 103 106  CO2 24   < > 24 19* 24  GLUCOSE 367*   < > 253* 146* 133*  BUN 19   < > 33* 20 10  CREATININE 0.86   < > 0.92 1.08 1.01  CALCIUM 9.1   < >  9.0 9.1 8.5*  GFRNONAA >60   < > >60 68 >60  GFRAA >60   < > >60 78 >60  PROT 6.9  --   --  6.1 6.4*  ALBUMIN 3.5  --   --  3.6* 3.2*  AST 37  --   --  57* 35  ALT 66*  --   --  79* 63*  ALKPHOS 90  --   --  98 81  BILITOT 1.0  --   --  0.5 0.8   < > = values in this interval not displayed.   Iron/TIBC/Ferritin/ %Sat    Component Value Date/Time   IRON 119 04/27/2019 0940   TIBC 363 04/27/2019 0940   FERRITIN 58 04/27/2019 0940   IRONPCTSAT 33 04/27/2019 0940       RADIOGRAPHIC STUDIES: I have personally reviewed the radiological images as listed and agreed with the findings in the report. CT CHEST W CONTRAST  Result Date: 01/02/2020 CLINICAL DATA:  74 year old male with history of pancreatic cancer with liver metastasis. Elevated PSA. EXAM: CT CHEST, ABDOMEN, AND PELVIS WITH CONTRAST TECHNIQUE: Multidetector CT imaging of the chest, abdomen and pelvis was performed following the standard protocol during bolus administration of intravenous contrast. CONTRAST:  169m OMNIPAQUE IOHEXOL 300 MG/ML  SOLN COMPARISON:  CT of the chest abdomen pelvis dated 11/05/2019. FINDINGS: CT CHEST FINDINGS Cardiovascular: There is no cardiomegaly or pericardial effusion. Three-vessel coronary vascular calcification and postsurgical changes of CABG. There is advanced atherosclerotic calcification of the thoracic aorta. No aneurysmal dilatation or dissection. The origins of the great vessels of the aortic arch appear patent as visualized. The central pulmonary arteries appear patent for the degree of opacification. Mediastinum/Nodes: No hilar or mediastinal adenopathy. The esophagus is grossly unremarkable. Several subcentimeter hypodense thyroid nodules. No followup recommended (Ref: J Am Coll Radiol. 2015 Feb;12(2): 143-50). No mediastinal fluid collection. Lungs/Pleura: There is a 4.2 x 3.0 cm ovoid soft tissue mass involving the medial left lung base similar to prior CT. 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 Several small subpleural nodules primarily in the right lung. A 3 mm right upper lobe nodule (series 5, image 64) similar or minimally increased in size. There is a 5 mm subpleural nodule in the right lower lobe (series 5, image 99) new since the prior CT. Additional 5 mm right lung base subpleural nodule (series 5, image 14) also appears new since the prior CT. There is an 8 mm nodule at the left lung base (series 5, image 111) which has  significantly increased in size since the prior CT. A 4 mm left lower lobe nodule (series 5, image 103) is new since the prior CT. These nodules are concerning for metastatic disease. There is no focal consolidation, pleural effusion, pneumothorax. The central airways are patent. Musculoskeletal: Median sternotomy wires. Right pectoral Port-A-Cath with tip at the cavoatrial junction. There is osteopenia with degenerative changes of the spine. No acute osseous pathology. No definite suspicious osseous lesions. CT ABDOMEN PELVIS FINDINGS No intra-abdominal free air or free fluid. Hepatobiliary: There is a background of fatty infiltration of the liver. Interval increase in the size and number of hepatic hypodense lesions consistent with progression of metastatic disease. The largest lesion measures approximately 3.4 x 2.8 cm in the right lobe of the liver (previously 2.0 x 1.1 cm). No intrahepatic biliary ductal dilatation. Cholecystectomy. Pancreas: There is a 4.0 x 3.0 cm (previously 3.5 x 2.8 cm) soft tissue lesion in  the region of the uncinate process of the pancreas (series 3, image 64). There is mild diffuse pancreatic atrophy and dilatation of the main pancreatic duct measuring up to 5 mm in diameter and progressed since the prior CT. There is mild haziness of the peripancreatic fat, likely reactive. Spleen: Normal in size without focal abnormality. Adrenals/Urinary Tract: The adrenal glands are unremarkable. There is no hydronephrosis on either side. Bilateral renal cysts measuring up to 4.3 cm in the inferior pole of the right kidney. There is symmetric enhancement and excretion of contrast by both kidneys. The visualized ureters and urinary bladder appear unremarkable. Stomach/Bowel: There is moderate stool throughout the colon. No bowel obstruction or active inflammation. The appendix is normal. Vascular/Lymphatic: Advanced aortoiliac atherosclerotic disease. The IVC is grossly unremarkable. Probable mild  narrowing of the porta splenic confluence secondary to pancreatic head mass. The SMV, splenic vein, and main portal vein are patent. No portal venous gas. Mildly enlarged pancreatico caval lymph node measures 15 mm (series 3, image 59). Reproductive: Mildly enlarged prostate gland. The seminal vesicles are symmetric. Other: None Musculoskeletal: Osteopenia with degenerative changes of the spine. L4-L5 posterior fusion and disc spacer. No acute osseous pathology. IMPRESSION: 1. Overall progression of hepatic and pulmonary metastatic disease since the prior CT of 11/05/2019. 2. Interval increase in the size of the pancreatic head mass and pancreatic duct dilatation. 3. A 15 mm peripancreatic lymph node, increased in size since the prior CT. 4. Ovoid soft tissue mass in the medial left lung base, similar to prior CT. 5. 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 6. No bowel obstruction. Normal appendix. 7. Aortic Atherosclerosis (ICD10-I70.0). Electronically Signed   By: Anner Crete M.D.   On: 01/02/2020 18:49   CT ABDOMEN PELVIS W CONTRAST  Result Date: 01/02/2020 CLINICAL DATA:  74 year old male with history of pancreatic cancer with liver metastasis. Elevated PSA. EXAM: CT CHEST, ABDOMEN, AND PELVIS WITH CONTRAST TECHNIQUE: Multidetector CT imaging of the chest, abdomen and pelvis was performed following the standard protocol during bolus administration of intravenous contrast. CONTRAST:  136m OMNIPAQUE IOHEXOL 300 MG/ML  SOLN COMPARISON:  CT of the chest abdomen pelvis dated 11/05/2019. FINDINGS: CT CHEST FINDINGS Cardiovascular: There is no cardiomegaly or pericardial effusion. Three-vessel coronary vascular calcification and postsurgical changes of CABG. There is advanced atherosclerotic calcification of the thoracic aorta. No aneurysmal dilatation or dissection. The origins of the great vessels of the aortic arch appear patent as visualized. The central pulmonary arteries appear  patent for the degree of opacification. Mediastinum/Nodes: No hilar or mediastinal adenopathy. The esophagus is grossly unremarkable. Several subcentimeter hypodense thyroid nodules. No followup recommended (Ref: J Am Coll Radiol. 2015 Feb;12(2): 143-50). No mediastinal fluid collection. Lungs/Pleura: There is a 4.2 x 3.0 cm ovoid soft tissue mass involving the medial left lung base similar to prior CT. 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 Several small subpleural nodules primarily in the right lung. A 3 mm right upper lobe nodule (series 5, image 64) similar or minimally increased in size. There is a 5 mm subpleural nodule in the right lower lobe (series 5, image 99) new since the prior CT. Additional 5 mm right lung base subpleural nodule (series 5, image 14) also appears new since the prior CT. There is an 8 mm nodule at the left lung base (series 5, image 111) which has significantly increased in size since the prior CT. A 4 mm left lower lobe nodule (series 5, image  103) is new since the prior CT. These nodules are concerning for metastatic disease. There is no focal consolidation, pleural effusion, pneumothorax. The central airways are patent. Musculoskeletal: Median sternotomy wires. Right pectoral Port-A-Cath with tip at the cavoatrial junction. There is osteopenia with degenerative changes of the spine. No acute osseous pathology. No definite suspicious osseous lesions. CT ABDOMEN PELVIS FINDINGS No intra-abdominal free air or free fluid. Hepatobiliary: There is a background of fatty infiltration of the liver. Interval increase in the size and number of hepatic hypodense lesions consistent with progression of metastatic disease. The largest lesion measures approximately 3.4 x 2.8 cm in the right lobe of the liver (previously 2.0 x 1.1 cm). No intrahepatic biliary ductal dilatation. Cholecystectomy. Pancreas: There is a 4.0 x 3.0 cm (previously 3.5 x 2.8 cm) soft  tissue lesion in the region of the uncinate process of the pancreas (series 3, image 64). There is mild diffuse pancreatic atrophy and dilatation of the main pancreatic duct measuring up to 5 mm in diameter and progressed since the prior CT. There is mild haziness of the peripancreatic fat, likely reactive. Spleen: Normal in size without focal abnormality. Adrenals/Urinary Tract: The adrenal glands are unremarkable. There is no hydronephrosis on either side. Bilateral renal cysts measuring up to 4.3 cm in the inferior pole of the right kidney. There is symmetric enhancement and excretion of contrast by both kidneys. The visualized ureters and urinary bladder appear unremarkable. Stomach/Bowel: There is moderate stool throughout the colon. No bowel obstruction or active inflammation. The appendix is normal. Vascular/Lymphatic: Advanced aortoiliac atherosclerotic disease. The IVC is grossly unremarkable. Probable mild narrowing of the porta splenic confluence secondary to pancreatic head mass. The SMV, splenic vein, and main portal vein are patent. No portal venous gas. Mildly enlarged pancreatico caval lymph node measures 15 mm (series 3, image 59). Reproductive: Mildly enlarged prostate gland. The seminal vesicles are symmetric. Other: None Musculoskeletal: Osteopenia with degenerative changes of the spine. L4-L5 posterior fusion and disc spacer. No acute osseous pathology. IMPRESSION: 1. Overall progression of hepatic and pulmonary metastatic disease since the prior CT of 11/05/2019. 2. Interval increase in the size of the pancreatic head mass and pancreatic duct dilatation. 3. A 15 mm peripancreatic lymph node, increased in size since the prior CT. 4. Ovoid soft tissue mass in the medial left lung base, similar to prior CT. 5. 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 6. No bowel obstruction. Normal appendix. 7. Aortic Atherosclerosis (ICD10-I70.0). Electronically Signed   By: Anner Crete M.D.   On: 01/02/2020 18:49      ASSESSMENT & PLAN:  1. Pancreatic cancer metastasized to liver ([place])   2. Liver metastasis ([place])   3. Encounter for antineoplastic chemotherapy   4. Hyperglycemia   5. Hypokalemia     #Metastatic pancreatic cancer- liver mets, hilar nodal metastasis, ?bone mets, CA 19-9 has trended up to 60741  Today's tumor marker is still pending Labs are reviewed and discussed with patient Okay to proceed with cycle three 5-FU/liposomal irinotecan.  #Hypokalemia, potassium 3.1, I recommend patient to start oral potassium chloride 20 mEq daily, for 7 days course.  Repeat BMP in 1 week. #  diabetes/ hyperglycemia, hyperglycemia is better controlled now on insulin regimen Continue.  #Normocytic anemia, likely secondary to chemo.  Hemoglobin trended down.  Continue to monitor.  #Epigastric pain, likely neoplasm related.  Currently he has no pain.  He uses Tylenol PRN. Not needing oxycodone yet. He has  oxycodone 5 mg every  6 hours Rx.  All questions were answered. The patient knows to call the clinic with any problems questions or concerns. Follow-up  2 weeks.   Earlie Server, MD, PhD Hematology Oncology [place] at [facility] 02/04/2020

## 2020-02-05 ENCOUNTER — Ambulatory Visit: Payer: Self-pay

## 2020-02-05 LAB — CANCER ANTIGEN 19-9: CA 19-9: 34684 U/mL — ABNORMAL HIGH (ref 0–35)

## 2020-02-06 ENCOUNTER — Other Ambulatory Visit: Payer: Self-pay

## 2020-02-06 ENCOUNTER — Ambulatory Visit: Payer: Medicare Other | Attending: Internal Medicine

## 2020-02-06 ENCOUNTER — Inpatient Hospital Stay: Payer: Medicare Other

## 2020-02-06 DIAGNOSIS — Z5111 Encounter for antineoplastic chemotherapy: Secondary | ICD-10-CM | POA: Diagnosis not present

## 2020-02-06 DIAGNOSIS — C259 Malignant neoplasm of pancreas, unspecified: Secondary | ICD-10-CM

## 2020-02-06 DIAGNOSIS — Z23 Encounter for immunization: Secondary | ICD-10-CM

## 2020-02-06 DIAGNOSIS — C787 Secondary malignant neoplasm of liver and intrahepatic bile duct: Secondary | ICD-10-CM

## 2020-02-06 MED ORDER — HEPARIN SOD (PORK) LOCK FLUSH 100 UNIT/ML IV SOLN
INTRAVENOUS | Status: AC
  Filled 2020-02-06: qty 5

## 2020-02-06 MED ORDER — HEPARIN SOD (PORK) LOCK FLUSH 100 UNIT/ML IV SOLN
500.0000 [IU] | Freq: Once | INTRAVENOUS | Status: AC | PRN
  Administered 2020-02-06: 500 [IU]
  Filled 2020-02-06: qty 5

## 2020-02-06 MED ORDER — SODIUM CHLORIDE 0.9% FLUSH
10.0000 mL | INTRAVENOUS | Status: DC | PRN
  Administered 2020-02-06: 10 mL
  Filled 2020-02-06: qty 10

## 2020-02-06 NOTE — Progress Notes (Signed)
   Covid-19 Vaccination Clinic  Name:  Craig [name]    MRN: [id number] DOB: 07-24-1946  02/06/2020  Mr. [name] was observed post Covid-19 immunization for 15 minutes without incident. He was provided with Vaccine Information Sheet and instruction to access the V-Safe system.   [name] was instructed to call 911 with any severe reactions post vaccine: [name] Kitchen Difficulty breathing  . Swelling of face and throat  . A fast heartbeat  . A bad rash all over body  . Dizziness and weakness   Immunizations Administered    Name Date Dose VIS Date Route   Pfizer COVID-19 Vaccine 02/06/2020 10:19 AM 0.3 mL 12/05/2018 Intramuscular   Manufacturer: [place]   Lot: [id number]   [place]: [id number]

## 2020-02-06 NOTE — Chronic Care Management (AMB) (Signed)
This encounter was opened in error. Please disregard.  Appointment was rescheduled per patient preference.

## 2020-02-07 NOTE — Progress Notes (Addendum)
Established patient visit  I,Horrace Hanak M Katoya Amato,acting as a scribe for Trinna Post, PA-C.,have documented all relevant documentation on the behalf of Trinna Post, PA-C,as directed by  Trinna Post, PA-C while in the presence of Trinna Post, PA-C.  Patient: Craig [name]   DOB: 15-Sep-1946   74 y.o. Male  MRN: [id number] Visit Date: 02/08/2020  Today's healthcare provider: Trinna Post, PA-C   Chief Complaint  Patient presents with  . Diabetes Mellitus   Subjective    HPI Diabetes Mellitus Type II, Follow-up  Lab Results  Component Value Date   HGBA1C 10.7 (H) 01/24/2020   HGBA1C 6.7 (H) 12/31/2019   HGBA1C 6.0 (H) 04/27/2019   Last seen for diabetes 11 days ago.  Management since then includes Patient was advised to stop taking the Januvia. Will continue with lantus 20units QHS and giving sample of sliding scale for Humalog. Patient has been using sliding scale meter for meal time insulin.  He reports fair compliance with treatment. He is having side effects. Hypoglycemia, patient had a blood sugar of 49 and he ate two pieces of chocolate cake and his sugar went up to 150. Patient had not eaten for many hours before this, he eats one large meal so he does not have to give himself frequent meal time shots. He also mentioned that he has not been able to get the freestyle libre. He reports his own glucometer has not been working due to showing an error message so he has been using his wife's glucometer which he does not bring with him today. Patient has low literacy and has difficulty recording his sugar logs by hand.  Symptoms: No fatigue No foot ulcerations No appetite changes No nausea No paresthesia (numbness or tingling) of the feet  No polydipsia (excessive thirst) No polyuria (frequent urination) No visual disturbances  No vomiting  Home blood sugar records: fasting range: 138  Episodes of hypoglycemia? Yes    Current insulin regiment: Lantus  inject 20U into skin at bedtime and Humalog sliding scale 3 times daily.  Most Recent Eye Exam: upcoming Current exercise: yard work Current diet habits: in general, a "healthy" diet    Pertinent Labs: Lab Results  Component Value Date   CHOL 180 04/27/2019   HDL 47 04/27/2019   LDLCALC 103 (H) 04/27/2019   TRIG 149 04/27/2019   CHOLHDL 3.8 04/27/2019   Lab Results  Component Value Date   NA 141 02/04/2020   K 3.1 (L) 02/04/2020   CO2 24 02/04/2020   GLUCOSE 133 (H) 02/04/2020   BUN 10 02/04/2020   CREATININE 1.01 02/04/2020   CALCIUM 8.5 (L) 02/04/2020   GFRNONAA >60 02/04/2020   GFRAA >60 02/04/2020     Wt Readings from Last 3 Encounters:  02/08/20 165 lb 9.6 oz (75.1 kg)  02/04/20 168 lb 8 oz (76.4 kg)  01/24/20 163 lb 9.3 oz (74.2 kg)    ---------------------------------------------------------------------------------------------------      Medications: Outpatient Medications Prior to Visit  Medication Sig  . aspirin EC 81 MG tablet Take 81 mg by mouth daily.  . carvedilol (COREG) 6.25 MG tablet Take 6.25 mg by mouth 2 (two) times daily with a meal.  . Continuous Blood Gluc Receiver (FREESTYLE LIBRE 14 DAY READER) DEVI 1 Device by Does not apply route every 14 (fourteen) days.  [name] Kitchen diltiazem (CARDIZEM CD) 300 MG 24 hr capsule TAKE 1 CAPSULE BY MOUTH EVERY DAY  . dorzolamide-timolol (COSOPT) 22.3-6.8 MG/ML ophthalmic solution  INSTILL ONE DROP INTO BOTH EYES TWICE DAILY  . ferrous sulfate 325 (65 FE) MG tablet TAKE 1 TABLET BY MOUTH EVERY DAY WITH BREAKFAST  . gabapentin (NEURONTIN) 300 MG capsule TAKE 2 CAPSULES (600 MG TOTAL) BY MOUTH 3 (THREE) TIMES DAILY.  [name] Kitchen ibuprofen (ADVIL) 800 MG tablet Take 1 tablet (800 mg total) by mouth every 8 (eight) hours as needed.  . insulin glargine (LANTUS SOLOSTAR) 100 UNIT/ML Solostar Pen Inject 20 Units into the skin at bedtime.  . insulin lispro (HUMALOG) 100 UNIT/ML KwikPen Inject 0.05 mLs (5 Units total) into the skin 3 (three)  times daily with meals.  . Insulin Pen Needle (PEN NEEDLES) 32G X 5 MM MISC Use four times daily for insulin  . latanoprost (XALATAN) 0.005 % ophthalmic solution Place 1 drop into both eyes at bedtime.   . lidocaine-prilocaine (EMLA) cream APPLY TO AFFECTED AREA ONCE AS DIRECTED  . Multiple Vitamin (MULTIVITAMIN WITH MINERALS) TABS tablet Take 1 tablet by mouth daily.  . OMEGA-3 FATTY ACIDS PO Take 1,200 mg by mouth daily.   [name] Kitchen omeprazole (PRILOSEC) 40 MG capsule TAKE 1 CAPSULE BY MOUTH EVERY DAY  . oxyCODONE (OXY IR/ROXICODONE) 5 MG immediate release tablet Take 1 tablet (5 mg total) by mouth every 6 (six) hours as needed for severe pain.  . potassium chloride SA (KLOR-CON) 20 MEQ tablet Take 1 tablet (20 mEq total) by mouth daily.  [name] Kitchen loperamide (IMODIUM A-D) 2 MG tablet Take 1 tablet (2 mg total) by mouth See admin instructions. Take 2 at diarrhea onset , then 1 every 2hr until 12hrs with no BM. May take 2 every 4hrs at night. If diarrhea recurs repeat. (Patient not taking: Reported on 02/08/2020)  . [DISCONTINUED] sitaGLIPtin (JANUVIA) 100 MG tablet Take 100 mg by mouth daily.   Facility-Administered Medications Prior to Visit  Medication Dose Route Frequency Provider  . sodium chloride flush (NS) 0.9 % injection 10 mL  10 mL Intracatheter PRN Earlie Server, MD  . sodium chloride flush (NS) 0.9 % injection 10 mL  10 mL Intravenous Once Earlie Server, MD    Review of Systems  Constitutional: Negative.   Respiratory: Negative.   Cardiovascular: Negative.   Hematological: Negative.       Objective    BP 117/71 (BP Location: Left Arm, Patient Position: Sitting, Cuff Size: Large)   Pulse 80   Temp (!) 96.8 F (36 C) (Temporal)   Wt 165 lb 9.6 oz (75.1 kg)   BMI 24.45 kg/m    Physical Exam Vitals reviewed.  Constitutional:      Appearance: Normal appearance.  Cardiovascular:     Rate and Rhythm: Normal rate and regular rhythm.     Pulses: Normal pulses.     Heart sounds: Normal heart  sounds.  Pulmonary:     Effort: Pulmonary effort is normal.     Breath sounds: Normal breath sounds.  Skin:    General: Skin is warm and dry.  Neurological:     Mental Status: He is alert.  Psychiatric:        Mood and Affect: Mood normal.        Behavior: Behavior normal.       Results for orders placed or performed in visit on 02/08/20  POCT Glucose (Device for Home Use)  Result Value Ref Range   POC Glucose 118 (A) 70 - 99 mg/dl    Assessment & Plan    1. Uncontrolled type 2 diabetes mellitus with hyperglycemia ([place]) Hold off  on mealtime insulin if sugars are too low, follow sliding scale as provided, he has an upcoming appointment with endocrinology. Patient educated about hypoglycemic protocol. Contact patient insurance about freestyle libre and also contacted CCM nurse.   Patient is low literacy and has difficulty recording his blood sugars by hand. He is checking his sugars four times daily.  He has been having difficulty using his own glucometer due to error message on the glucometer and has been using his wife's meter, which he does not bring with him for interrogation today. He is able to verbally express his fasting blood sugars have ranged in the low 100's- 130's in the mornings for the past two weeks. On three to four mornings of the past two weeks, he reports fasting blood sugars in the 200's. His before meal time insulin can range from 100-130's.   Patient ultimately has stage IV metastatic pancreatic cancer. IN the span of 3 weeks his C peptide decreased from approximately 6 to 1 and his sugars were extremely volatile ranging from 100 to 600. He required hospitalization for DKA. I think patient would greatly benefit from a freestyle libre.    Return if symptoms worsen or fail to improve.      ITrinna Post, PA-C, have reviewed all documentation for this visit. The documentation on 02/08/20 for the exam, diagnosis, procedures, and orders are all accurate and  complete.    Paulene Floor  [facility] [phone] (phone) [phone] (fax)  [place]

## 2020-02-08 ENCOUNTER — Ambulatory Visit (INDEPENDENT_AMBULATORY_CARE_PROVIDER_SITE_OTHER): Payer: Medicare Other | Admitting: Physician Assistant

## 2020-02-08 ENCOUNTER — Other Ambulatory Visit: Payer: Self-pay

## 2020-02-08 ENCOUNTER — Encounter: Payer: Self-pay | Admitting: Physician Assistant

## 2020-02-08 VITALS — BP 117/71 | HR 80 | Temp 96.8°F | Wt 165.6 lb

## 2020-02-08 DIAGNOSIS — E1165 Type 2 diabetes mellitus with hyperglycemia: Secondary | ICD-10-CM | POA: Diagnosis not present

## 2020-02-08 LAB — POCT GLUCOSE (DEVICE FOR HOME USE): POC Glucose: 118 mg/dl — AB (ref 70–99)

## 2020-02-08 NOTE — Patient Instructions (Signed)
Diabetes Mellitus and Exercise Exercising regularly is important for your overall health, especially when you have diabetes (diabetes mellitus). Exercising is not only about losing weight. It has many other health benefits, such as increasing muscle strength and bone density and reducing body fat and stress. This leads to improved fitness, flexibility, and endurance, all of which result in better overall health. Exercise has additional benefits for people with diabetes, including:  Reducing appetite.  Helping to lower and control blood glucose.  Lowering blood pressure.  Helping to control amounts of fatty substances (lipids) in the blood, such as cholesterol and triglycerides.  Helping the body to respond better to insulin (improving insulin sensitivity).  Reducing how much insulin the body needs.  Decreasing the risk for heart disease by: ? Lowering cholesterol and triglyceride levels. ? Increasing the levels of good cholesterol. ? Lowering blood glucose levels. What is my activity plan? Your health care provider or certified diabetes educator can help you make a plan for the type and frequency of exercise (activity plan) that works for you. Make sure that you:  Do at least 150 minutes of moderate-intensity or vigorous-intensity exercise each week. This could be brisk walking, biking, or water aerobics. ? Do stretching and strength exercises, such as yoga or weightlifting, at least 2 times a week. ? Spread out your activity over at least 3 days of the week.  Get some form of physical activity every day. ? Do not go more than 2 days in a row without some kind of physical activity. ? Avoid being inactive for more than 30 minutes at a time. Take frequent breaks to walk or stretch.  Choose a type of exercise or activity that you enjoy, and set realistic goals.  Start slowly, and gradually increase the intensity of your exercise over time. What do I need to know about managing my  diabetes?   Check your blood glucose before and after exercising. ? If your blood glucose is 240 mg/dL (13.3 mmol/L) or higher before you exercise, check your urine for ketones. If you have ketones in your urine, do not exercise until your blood glucose returns to normal. ? If your blood glucose is 100 mg/dL (5.6 mmol/L) or lower, eat a snack containing 15-20 grams of carbohydrate. Check your blood glucose 15 minutes after the snack to make sure that your level is above 100 mg/dL (5.6 mmol/L) before you start your exercise.  Know the symptoms of low blood glucose (hypoglycemia) and how to treat it. Your risk for hypoglycemia increases during and after exercise. Common symptoms of hypoglycemia can include: ? Hunger. ? Anxiety. ? Sweating and feeling clammy. ? Confusion. ? Dizziness or feeling light-headed. ? Increased heart rate or palpitations. ? Blurry vision. ? Tingling or numbness around the mouth, lips, or tongue. ? Tremors or shakes. ? Irritability.  Keep a rapid-acting carbohydrate snack available before, during, and after exercise to help prevent or treat hypoglycemia.  Avoid injecting insulin into areas of the body that are going to be exercised. For example, avoid injecting insulin into: ? The arms, when playing tennis. ? The legs, when jogging.  Keep records of your exercise habits. Doing this can help you and your health care provider adjust your diabetes management plan as needed. Write down: ? Food that you eat before and after you exercise. ? Blood glucose levels before and after you exercise. ? The type and amount of exercise you have done. ? When your insulin is expected to peak, if you use   insulin. Avoid exercising at times when your insulin is peaking.  When you start a new exercise or activity, work with your health care provider to make sure the activity is safe for you, and to adjust your insulin, medicines, or food intake as needed.  Drink plenty of water while  you exercise to prevent dehydration or heat stroke. Drink enough fluid to keep your urine clear or pale yellow. Summary  Exercising regularly is important for your overall health, especially when you have diabetes (diabetes mellitus).  Exercising has many health benefits, such as increasing muscle strength and bone density and reducing body fat and stress.  Your health care provider or certified diabetes educator can help you make a plan for the type and frequency of exercise (activity plan) that works for you.  When you start a new exercise or activity, work with your health care provider to make sure the activity is safe for you, and to adjust your insulin, medicines, or food intake as needed. This information is not intended to replace advice given to you by your health care provider. Make sure you discuss any questions you have with your health care provider. Document Revised: 04/21/2017 Document Reviewed: 03/08/2016 Elsevier Patient Education  2020 Elsevier Inc.  

## 2020-02-08 NOTE — Chronic Care Management (AMB) (Signed)
  Chronic Care Management   Note   Name: Craig [name] MRN: [id number] DOB: Mar 11, 1946   Outreach rescheduled. Craig [name] states he will have his chemo pump removed on tomorrow. He will be evaluated by his primary care provider on 02/08/20. Agreeable to outreach next week.    Follow up plan: The care management team will follow up with Craig [name] as requested on next week.    [name] [facility] Care Management [phone]

## 2020-02-09 ENCOUNTER — Other Ambulatory Visit: Payer: Self-pay | Admitting: Physician Assistant

## 2020-02-09 NOTE — Telephone Encounter (Signed)
  1. Uncontrolled type 2 diabetes mellitus with hyperglycemia (Craig [name]) Hold off on mealtime insulin if sugars are too low, he has an upcoming appointment with endocrinology. Patient educated about hypoglycemic protocol. Contact patient insurance about freestyle libre and also contacted CCM nurse.

## 2020-02-11 ENCOUNTER — Inpatient Hospital Stay: Payer: Medicare Other

## 2020-02-11 ENCOUNTER — Telehealth: Payer: Self-pay | Admitting: Physician Assistant

## 2020-02-11 ENCOUNTER — Inpatient Hospital Stay: Payer: Medicare Other | Attending: Oncology

## 2020-02-11 ENCOUNTER — Other Ambulatory Visit: Payer: Self-pay

## 2020-02-11 DIAGNOSIS — R109 Unspecified abdominal pain: Secondary | ICD-10-CM | POA: Insufficient documentation

## 2020-02-11 DIAGNOSIS — E1165 Type 2 diabetes mellitus with hyperglycemia: Secondary | ICD-10-CM | POA: Insufficient documentation

## 2020-02-11 DIAGNOSIS — Z836 Family history of other diseases of the respiratory system: Secondary | ICD-10-CM | POA: Insufficient documentation

## 2020-02-11 DIAGNOSIS — C787 Secondary malignant neoplasm of liver and intrahepatic bile duct: Secondary | ICD-10-CM | POA: Insufficient documentation

## 2020-02-11 DIAGNOSIS — K760 Fatty (change of) liver, not elsewhere classified: Secondary | ICD-10-CM | POA: Diagnosis not present

## 2020-02-11 DIAGNOSIS — R5383 Other fatigue: Secondary | ICD-10-CM | POA: Diagnosis not present

## 2020-02-11 DIAGNOSIS — I2510 Atherosclerotic heart disease of native coronary artery without angina pectoris: Secondary | ICD-10-CM | POA: Diagnosis not present

## 2020-02-11 DIAGNOSIS — R531 Weakness: Secondary | ICD-10-CM | POA: Insufficient documentation

## 2020-02-11 DIAGNOSIS — I110 Hypertensive heart disease with heart failure: Secondary | ICD-10-CM | POA: Insufficient documentation

## 2020-02-11 DIAGNOSIS — D6481 Anemia due to antineoplastic chemotherapy: Secondary | ICD-10-CM | POA: Insufficient documentation

## 2020-02-11 DIAGNOSIS — R599 Enlarged lymph nodes, unspecified: Secondary | ICD-10-CM | POA: Diagnosis not present

## 2020-02-11 DIAGNOSIS — M8580 Other specified disorders of bone density and structure, unspecified site: Secondary | ICD-10-CM | POA: Diagnosis not present

## 2020-02-11 DIAGNOSIS — I700 Atherosclerosis of aorta: Secondary | ICD-10-CM | POA: Diagnosis not present

## 2020-02-11 DIAGNOSIS — Z794 Long term (current) use of insulin: Secondary | ICD-10-CM | POA: Diagnosis not present

## 2020-02-11 DIAGNOSIS — N400 Enlarged prostate without lower urinary tract symptoms: Secondary | ICD-10-CM | POA: Diagnosis not present

## 2020-02-11 DIAGNOSIS — E876 Hypokalemia: Secondary | ICD-10-CM | POA: Diagnosis not present

## 2020-02-11 DIAGNOSIS — C771 Secondary and unspecified malignant neoplasm of intrathoracic lymph nodes: Secondary | ICD-10-CM | POA: Diagnosis not present

## 2020-02-11 DIAGNOSIS — Z888 Allergy status to other drugs, medicaments and biological substances status: Secondary | ICD-10-CM | POA: Insufficient documentation

## 2020-02-11 DIAGNOSIS — E042 Nontoxic multinodular goiter: Secondary | ICD-10-CM | POA: Diagnosis not present

## 2020-02-11 DIAGNOSIS — Z5111 Encounter for antineoplastic chemotherapy: Secondary | ICD-10-CM | POA: Diagnosis present

## 2020-02-11 DIAGNOSIS — Z79899 Other long term (current) drug therapy: Secondary | ICD-10-CM | POA: Diagnosis not present

## 2020-02-11 DIAGNOSIS — C7951 Secondary malignant neoplasm of bone: Secondary | ICD-10-CM | POA: Insufficient documentation

## 2020-02-11 DIAGNOSIS — Z808 Family history of malignant neoplasm of other organs or systems: Secondary | ICD-10-CM | POA: Insufficient documentation

## 2020-02-11 DIAGNOSIS — C259 Malignant neoplasm of pancreas, unspecified: Secondary | ICD-10-CM | POA: Diagnosis not present

## 2020-02-11 DIAGNOSIS — N281 Cyst of kidney, acquired: Secondary | ICD-10-CM | POA: Insufficient documentation

## 2020-02-11 DIAGNOSIS — T451X5A Adverse effect of antineoplastic and immunosuppressive drugs, initial encounter: Secondary | ICD-10-CM | POA: Insufficient documentation

## 2020-02-11 DIAGNOSIS — Z87891 Personal history of nicotine dependence: Secondary | ICD-10-CM | POA: Insufficient documentation

## 2020-02-11 DIAGNOSIS — K219 Gastro-esophageal reflux disease without esophagitis: Secondary | ICD-10-CM | POA: Diagnosis not present

## 2020-02-11 DIAGNOSIS — Z809 Family history of malignant neoplasm, unspecified: Secondary | ICD-10-CM | POA: Insufficient documentation

## 2020-02-11 LAB — COMPREHENSIVE METABOLIC PANEL
ALT: 38 U/L (ref 0–44)
AST: 23 U/L (ref 15–41)
Albumin: 3.4 g/dL — ABNORMAL LOW (ref 3.5–5.0)
Alkaline Phosphatase: 79 U/L (ref 38–126)
Anion gap: 9 (ref 5–15)
BUN: 18 mg/dL (ref 8–23)
CO2: 23 mmol/L (ref 22–32)
Calcium: 8.4 mg/dL — ABNORMAL LOW (ref 8.9–10.3)
Chloride: 108 mmol/L (ref 98–111)
Creatinine, Ser: 0.99 mg/dL (ref 0.61–1.24)
GFR calc Af Amer: >60 mL/min (ref >60)
GFR calc non Af Amer: >60 mL/min (ref >60)
Glucose, Bld: 79 mg/dL (ref 70–99)
Potassium: 4.3 mmol/L (ref 3.5–5.1)
Sodium: 140 mmol/L (ref 135–145)
Total Bilirubin: 0.7 mg/dL (ref 0.3–1.2)
Total Protein: 6.2 g/dL — ABNORMAL LOW (ref 6.5–8.1)

## 2020-02-11 LAB — CBC WITH DIFFERENTIAL/PLATELET
Abs Immature Granulocytes: 0.03 10*3/uL (ref 0.00–0.07)
Basophils Absolute: 0 10*3/uL (ref 0.0–0.1)
Basophils Relative: 0 %
Eosinophils Absolute: 0.1 10*3/uL (ref 0.0–0.5)
Eosinophils Relative: 4 %
HCT: 32.2 % — ABNORMAL LOW (ref 39.0–52.0)
Hemoglobin: 10.1 g/dL — ABNORMAL LOW (ref 13.0–17.0)
Immature Granulocytes: 1 %
Lymphocytes Relative: 24 %
Lymphs Abs: 0.9 10*3/uL (ref 0.7–4.0)
MCH: 28.6 pg (ref 26.0–34.0)
MCHC: 31.4 g/dL (ref 30.0–36.0)
MCV: 91.2 fL (ref 80.0–100.0)
Monocytes Absolute: 0.3 10*3/uL (ref 0.1–1.0)
Monocytes Relative: 8 %
Neutro Abs: 2.5 10*3/uL (ref 1.7–7.7)
Neutrophils Relative %: 63 %
Platelets: 198 10*3/uL (ref 150–400)
RBC: 3.53 MIL/uL — ABNORMAL LOW (ref 4.22–5.81)
RDW: 16.2 % — ABNORMAL HIGH (ref 11.5–15.5)
WBC: 3.9 10*3/uL — ABNORMAL LOW (ref 4.0–10.5)
nRBC: 0 % (ref 0.0–0.2)

## 2020-02-11 NOTE — Progress Notes (Signed)
Nutrition Follow-up:  Patient with pancreatic cancer with mets to liver.  Patient receiving chemotherapy.    Met with patient and wife in clinic today vs phone visit.  Patient also had labs drawn today.  Patient reports appetite is good following hospital admission.  Reports food was not good in the hospital.  Noted hospital admission for DKA.  Reports breakfast maybe egg, sausage or egg, sausage biscuit.  Lunch is beanie weanies and pack of nabs.  Supper last night was chicken gizzards, rice and watermelon and 1/2 muffin.  Has not really been drinking very many of the protein shakes.  Patient reports prior to hospital admission was having issues with constipation.  Since out of hospital took medicine to clean him out.  Now having bowel movement 2-3 times per day.  Is no longer taking anything for bowels to move.    Medications: reviewed  Labs: results from today not back before seeing patient.   Anthropometrics:   Weight 165 lb on 4/30 decreased from 179 lb on 3/15  NUTRITION DIAGNOSIS: Inadequate oral intake improving from hospital admission   INTERVENTION:  Provided samples of boost glucose control, ensure enlive and boost plus.  Recommend boost glucose control or glucerna shake if appetite is good between meals.  If appetite is not good and intake is down recommend ensure enlive, boost plus or ensure plus as higher calorie.   Reviewed ways to add calories and protein to diet without increasing carbohydrates.   Patient has contact information    MONITORING, EVALUATION, GOAL: Patient will consume adequate calories and protein to maintain weight   NEXT VISIT: June 3rd phone f/u (Thursday)  Charrisse Masley B. Zenia Resides, [place], [place] Registered Dietitian [phone] (pager)

## 2020-02-11 NOTE — Progress Notes (Signed)
Pharmacist Chemotherapy Monitoring - Follow Up Assessment    I verify that I have reviewed each item in the below checklist:  . Regimen for the patient is scheduled for the appropriate day and plan matches scheduled date. [name] Kitchen Appropriate non-routine labs are ordered dependent on drug ordered. . If applicable, additional medications reviewed and ordered per protocol based on lifetime cumulative doses and/or treatment regimen.   Plan for follow-up and/or issues identified: No . I-vent associated with next due treatment: No . MD and/or nursing notified: No  Craig [name] 02/11/2020 8:40 AM

## 2020-02-11 NOTE — Telephone Encounter (Signed)
Lyn advised.   Thanks,   -Mickel Baas

## 2020-02-11 NOTE — Telephone Encounter (Signed)
Copied from [place] [id number]. Topic: General - Other >> Feb 11, 2020 12:26 PM Keene Breath wrote: Reason for CRM: Verbal for Nursing - 2xwk 3, 1xwk 6, 2 PRN visits.  Any questions, please call at [phone]

## 2020-02-11 NOTE — Telephone Encounter (Signed)
Verbal order is fine!

## 2020-02-12 ENCOUNTER — Ambulatory Visit: Payer: Self-pay | Admitting: Physician Assistant

## 2020-02-12 ENCOUNTER — Ambulatory Visit (INDEPENDENT_AMBULATORY_CARE_PROVIDER_SITE_OTHER): Payer: Medicare Other

## 2020-02-12 DIAGNOSIS — E119 Type 2 diabetes mellitus without complications: Secondary | ICD-10-CM

## 2020-02-12 DIAGNOSIS — E1165 Type 2 diabetes mellitus with hyperglycemia: Secondary | ICD-10-CM

## 2020-02-12 NOTE — Chronic Care Management (AMB) (Signed)
Chronic Care Management   Initial Visit Note  02/12/2020 Name: Craig [name] MRN: [id number] DOB: 06/09/1946  Primary Care Provider: Trinna Post, PA-C Reason for referral : Chronic Care Management  Craig [name] is a 74 y.o. year old male who is a primary care patient of Craig [name], [place]. The CCM team was consulted for assistance with chronic disease management and care coordination needs. A telephonic assessment was conducted today.  Review of Craig [name] status, including review of consultants reports, relevant labs and test results was conducted today. Collaboration with appropriate care team members was performed as part of the comprehensive evaluation and provision of chronic care management services.    SDOH (Social Determinants of Health) assessments performed: Yes No interventions required at this time.    Medications: Outpatient Encounter Medications as of 02/12/2020  Medication Sig Note  . aspirin EC 81 MG tablet Take 81 mg by mouth daily.   . carvedilol (COREG) 6.25 MG tablet Take 6.25 mg by mouth 2 (two) times daily with a meal.   . Continuous Blood Gluc Receiver (FREESTYLE LIBRE 14 DAY READER) DEVI 1 Device by Does not apply route every 14 (fourteen) days. 02/12/2020: Pending  . diltiazem (CARDIZEM CD) 300 MG 24 hr capsule TAKE 1 CAPSULE BY MOUTH EVERY DAY   . dorzolamide-timolol (COSOPT) 22.3-6.8 MG/ML ophthalmic solution INSTILL ONE DROP INTO BOTH EYES TWICE DAILY   . ferrous sulfate 325 (65 FE) MG tablet TAKE 1 TABLET BY MOUTH EVERY DAY WITH BREAKFAST   . gabapentin (NEURONTIN) 300 MG capsule TAKE 2 CAPSULES (600 MG TOTAL) BY MOUTH 3 (THREE) TIMES DAILY.   [name] Kitchen ibuprofen (ADVIL) 800 MG tablet Take 1 tablet (800 mg total) by mouth every 8 (eight) hours as needed.   . insulin glargine (LANTUS SOLOSTAR) 100 UNIT/ML Solostar Pen Inject 20 Units into the skin at bedtime.   . insulin lispro (HUMALOG) 100 UNIT/ML KwikPen Inject 0.05 mLs (5 Units total) into the skin 3  (three) times daily with meals.   . Insulin Pen Needle (PEN NEEDLES) 32G X 5 MM MISC Use four times daily for insulin   . latanoprost (XALATAN) 0.005 % ophthalmic solution Place 1 drop into both eyes at bedtime.    . lidocaine-prilocaine (EMLA) cream APPLY TO AFFECTED AREA ONCE AS DIRECTED   . loperamide (IMODIUM A-D) 2 MG tablet Take 1 tablet (2 mg total) by mouth See admin instructions. Take 2 at diarrhea onset , then 1 every 2hr until 12hrs with no BM. May take 2 every 4hrs at night. If diarrhea recurs repeat. (Patient not taking: Reported on 02/08/2020)   . Multiple Vitamin (MULTIVITAMIN WITH MINERALS) TABS tablet Take 1 tablet by mouth daily.   . OMEGA-3 FATTY ACIDS PO Take 1,200 mg by mouth daily.    [name] Kitchen omeprazole (PRILOSEC) 40 MG capsule TAKE 1 CAPSULE BY MOUTH EVERY DAY   . ONETOUCH VERIO test strip TEST FASTING SUGAR EVERY MORNING   . oxyCODONE (OXY IR/ROXICODONE) 5 MG immediate release tablet Take 1 tablet (5 mg total) by mouth every 6 (six) hours as needed for severe pain.   . potassium chloride SA (KLOR-CON) 20 MEQ tablet Take 1 tablet (20 mEq total) by mouth daily.    Facility-Administered Encounter Medications as of 02/12/2020  Medication  . sodium chloride flush (NS) 0.9 % injection 10 mL  . sodium chloride flush (NS) 0.9 % injection 10 mL     Objective:  BP Readings from Last 3 Encounters:  02/08/20 117/71  02/04/20 110/68  01/26/20 (!) 165/74    Lab Results  Component Value Date   HGBA1C 10.7 (H) 01/24/2020    Goals Addressed            This Visit's Progress   . Chronic Disease Management       CARE PLAN ENTRY (see longitudinal plan of care for additional care plan information)  Current Barriers:  . Chronic Disease Management support and education needs related to Hypertension, Coronary Artery Disease, Diabetes, GERD and pancreatic cancer.  Nurse Case Manager Clinical Goal(s):  [name] Kitchen Over the next 90 days, patient will not experience unplanned hospital  admission. . Over the next 90 days, patient will take all medications as prescribed. . Over the next 90 days, patient will attend all scheduled medical appointments. . Over the next 90 days, patient will follow provider recommendations regarding blood glucose monitoring. . Over the next 30 days, patient will obtain prescribed Freestyle Libre continuous glucose monitoring device. . Over the next 90 days, patient will work with care management team and provide updates regarding goals of care and changes in health needs.   Interventions:  . Inter-disciplinary care team collaboration (see longitudinal plan of care) . Reviewed medications and discussed indications for use. Encouraged to take all medications as prescribed and inform team with concerns regarding prescription costs. Reports self administering medications. Denies current need for assistance with medication management or costs. . Provided additional education regarding blood glucose parameters along with recommended interventions. Discussed indications for seeking immediate medical attention. Encouraged to continue monitoring and recording readings until [facility] device is obtained.  . Reviewed pending appointments. Encouraged to attend appointments as scheduled to prevent delays in care. Encouraged to update care management team with concerns regarding transportation.  . Discussed plans for ongoing care management and follow up. Provided direct contact information for care management team. Reports good family support. Declines current need for additional assistance in the home.   Patient Self Care Activities:  . Self administers medications . Attends scheduled provider appointments . Calls pharmacy for medication refills . Attends church or other social activities . Performs ADL's independently . Performs IADL's independently . Calls provider office for new concerns or questions   Initial goal documentation        Mr.  [name] was given information about Chronic Care Management services  including:  1. CCM service includes personalized support from designated clinical staff supervised by his physician, including individualized plan of care and coordination with other care providers 2. 24/7 contact phone numbers for assistance for urgent and routine care needs. 3. Service will only be billed when office clinical staff spend 20 minutes or more in a month to coordinate care. 4. Only one practitioner may furnish and bill the service in a calendar month. 5. The patient may stop CCM services at any time (effective at the end of the month) by phone call to the office staff. 6. The patient will be responsible for cost sharing (co-pay) of up to 20% of the service fee (after annual deductible is met).  Patient agreed to services and verbal consent obtained.    PLAN The care management team will follow-up with Mr. Cogbill within the next 30 days.    [name] [facility] Care Management [phone]

## 2020-02-14 ENCOUNTER — Telehealth: Payer: Self-pay

## 2020-02-18 ENCOUNTER — Inpatient Hospital Stay (HOSPITAL_BASED_OUTPATIENT_CLINIC_OR_DEPARTMENT_OTHER): Payer: Medicare Other | Admitting: Oncology

## 2020-02-18 ENCOUNTER — Encounter: Payer: Self-pay | Admitting: Oncology

## 2020-02-18 ENCOUNTER — Other Ambulatory Visit: Payer: Self-pay

## 2020-02-18 ENCOUNTER — Inpatient Hospital Stay: Payer: Medicare Other

## 2020-02-18 VITALS — BP 134/78 | HR 80 | Temp 96.4°F | Resp 18 | Wt 168.2 lb

## 2020-02-18 DIAGNOSIS — R739 Hyperglycemia, unspecified: Secondary | ICD-10-CM | POA: Diagnosis not present

## 2020-02-18 DIAGNOSIS — Z5111 Encounter for antineoplastic chemotherapy: Secondary | ICD-10-CM | POA: Diagnosis not present

## 2020-02-18 DIAGNOSIS — C259 Malignant neoplasm of pancreas, unspecified: Secondary | ICD-10-CM

## 2020-02-18 DIAGNOSIS — D6481 Anemia due to antineoplastic chemotherapy: Secondary | ICD-10-CM

## 2020-02-18 DIAGNOSIS — T451X5A Adverse effect of antineoplastic and immunosuppressive drugs, initial encounter: Secondary | ICD-10-CM

## 2020-02-18 DIAGNOSIS — C787 Secondary malignant neoplasm of liver and intrahepatic bile duct: Secondary | ICD-10-CM

## 2020-02-18 DIAGNOSIS — E876 Hypokalemia: Secondary | ICD-10-CM

## 2020-02-18 LAB — COMPREHENSIVE METABOLIC PANEL
ALT: 36 U/L (ref 0–44)
AST: 28 U/L (ref 15–41)
Albumin: 3.3 g/dL — ABNORMAL LOW (ref 3.5–5.0)
Alkaline Phosphatase: 86 U/L (ref 38–126)
Anion gap: 10 (ref 5–15)
BUN: 15 mg/dL (ref 8–23)
CO2: 24 mmol/L (ref 22–32)
Calcium: 8.4 mg/dL — ABNORMAL LOW (ref 8.9–10.3)
Chloride: 106 mmol/L (ref 98–111)
Creatinine, Ser: 0.9 mg/dL (ref 0.61–1.24)
GFR calc Af Amer: >60 mL/min (ref >60)
GFR calc non Af Amer: >60 mL/min (ref >60)
Glucose, Bld: 161 mg/dL — ABNORMAL HIGH (ref 70–99)
Potassium: 3.4 mmol/L — ABNORMAL LOW (ref 3.5–5.1)
Sodium: 140 mmol/L (ref 135–145)
Total Bilirubin: 0.7 mg/dL (ref 0.3–1.2)
Total Protein: 6.4 g/dL — ABNORMAL LOW (ref 6.5–8.1)

## 2020-02-18 LAB — CBC WITH DIFFERENTIAL/PLATELET
Abs Immature Granulocytes: 0.02 10*3/uL (ref 0.00–0.07)
Basophils Absolute: 0.1 10*3/uL (ref 0.0–0.1)
Basophils Relative: 1 %
Eosinophils Absolute: 0.2 10*3/uL (ref 0.0–0.5)
Eosinophils Relative: 4 %
HCT: 32.4 % — ABNORMAL LOW (ref 39.0–52.0)
Hemoglobin: 10.3 g/dL — ABNORMAL LOW (ref 13.0–17.0)
Immature Granulocytes: 0 %
Lymphocytes Relative: 19 %
Lymphs Abs: 0.9 10*3/uL (ref 0.7–4.0)
MCH: 29 pg (ref 26.0–34.0)
MCHC: 31.8 g/dL (ref 30.0–36.0)
MCV: 91.3 fL (ref 80.0–100.0)
Monocytes Absolute: 0.5 10*3/uL (ref 0.1–1.0)
Monocytes Relative: 10 %
Neutro Abs: 3.3 10*3/uL (ref 1.7–7.7)
Neutrophils Relative %: 66 %
Platelets: 236 10*3/uL (ref 150–400)
RBC: 3.55 MIL/uL — ABNORMAL LOW (ref 4.22–5.81)
RDW: 16.7 % — ABNORMAL HIGH (ref 11.5–15.5)
WBC: 4.9 10*3/uL (ref 4.0–10.5)
nRBC: 0 % (ref 0.0–0.2)

## 2020-02-18 MED ORDER — SODIUM CHLORIDE 0.9 % IV SOLN
Freq: Once | INTRAVENOUS | Status: AC
  Filled 2020-02-18: qty 250

## 2020-02-18 MED ORDER — SODIUM CHLORIDE 0.9 % IV SOLN
10.0000 mg | Freq: Once | INTRAVENOUS | Status: AC
  Administered 2020-02-18: 10 mg via INTRAVENOUS
  Filled 2020-02-18: qty 10

## 2020-02-18 MED ORDER — HEPARIN SOD (PORK) LOCK FLUSH 100 UNIT/ML IV SOLN
INTRAVENOUS | Status: AC
  Filled 2020-02-18: qty 5

## 2020-02-18 MED ORDER — PALONOSETRON HCL INJECTION 0.25 MG/5ML
0.2500 mg | Freq: Once | INTRAVENOUS | Status: AC
  Administered 2020-02-18: 0.25 mg via INTRAVENOUS
  Filled 2020-02-18: qty 5

## 2020-02-18 MED ORDER — POTASSIUM CHLORIDE ER 10 MEQ PO TBCR: 10.0000 meq | EXTENDED_RELEASE_TABLET | Freq: Every day | ORAL | 0 refills | Status: DC

## 2020-02-18 MED ORDER — SODIUM CHLORIDE 0.9% FLUSH
10.0000 mL | Freq: Once | INTRAVENOUS | Status: AC
  Administered 2020-02-18: 10 mL via INTRAVENOUS
  Filled 2020-02-18: qty 10

## 2020-02-18 MED ORDER — SODIUM CHLORIDE 0.9 % IV SOLN
421.0000 mg/m2 | Freq: Once | INTRAVENOUS | Status: AC
  Administered 2020-02-18: 800 mg via INTRAVENOUS
  Filled 2020-02-18: qty 40

## 2020-02-18 MED ORDER — SODIUM CHLORIDE 0.9 % IV SOLN
67.8000 mg/m2 | Freq: Once | INTRAVENOUS | Status: AC
  Administered 2020-02-18: 129 mg via INTRAVENOUS
  Filled 2020-02-18: qty 30

## 2020-02-18 MED ORDER — SODIUM CHLORIDE 0.9 % IV SOLN
2400.0000 mg/m2 | INTRAVENOUS | Status: DC
  Administered 2020-02-18: 4550 mg via INTRAVENOUS
  Filled 2020-02-18: qty 91

## 2020-02-18 NOTE — Progress Notes (Signed)
Hematology/Oncology  Follow up note [facility] [phone] [phone]   Patient Care Team: Paulene Floor as PCP - General (Physician Assistant) Clent Jacks, RN as Oncology Nurse Navigator Earlie Server, MD as Consulting Physician (Hematology and Oncology) Neldon Labella, RN as Case Manager  REFERRING PROVIDER: Trinna Post, PA-C  CHIEF COMPLAINTS/REASON FOR VISIT:  Follow up for treatment of pancreatic cancer  HISTORY OF PRESENTING ILLNESS:   Craig [name] is a  74 y.o.  male with PMH listed below, including CABG x5, PVD, hypertension, former tobacco abuse, former alcohol abuse, iron deficiency anemia, was seen in consultation at the request of  Terrilee Croak, Adriana M, PA-C  for evaluation of abnormal CT Patient recently presented to primary care provider complaining for abdominal pain radiating to his back for about 10 days.  Patient uses Tylenol as needed for pain. Work-up showed elevated amylase, lipase, mild anemia with hemoglobin of 11.3 Acute hepatitis panel negative. 05/16/2019 CT abdomen pelvis with contrast showed poorly marginated heterogeneous hypodense 2.9 cm pancreatic mass at the uncinate process, worrisome for primary pancreatic cancer.  No biliary or pancreatic duct dilation. Several ill defined hypodense liver masses scattered throughout the liver, largest 3.1 cm in the segment 6 right liver lobe. Nonspecific portacaval and aortocaval adenopathy. Extreme medial left lung base 4.4 cm pleural-based mass probably a pleural metastasis.  Additional tiny pulmonary nodules scattered at the right lung base are indeterminate. Mild prostate or megaly  He had a history of 37.5-pack-year smoking history quitting 2018. No longer drinking alcohol.  # ultrasound-guided liver biopsy on 05/21/2019.  Pathology showed fragments of benign hepatic parenchyma showing minimal macro vascular steatosis, otherwise no significant histo  pathologic change. Single core of renal tissue showing approximately 25% glomerulosclerosis  # #History of CAD, status post CABG x5.  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  # Omniseq test showed PD-L1 50% TPS, TMB high, negative for BRCA1/2 or NTRK fusion.  MSI could not be completed.  Patient declines genetic testing  #  third line chemotherapy treatment 5-FU with liposomal irinotecan 01/24/2020-01/26/2020 patient was admitted to ICU due to DKA. on long-acting insulin 20 units at night and takes sliding scale with meals.  INTERVAL HISTORY Craig [name] is a 74 y.o. male who has above history reviewed by me today presents for evaluation of chemotherapy tolerability for metastatic pancreatic cancer.   Problems and complaints are listed below: Patient reports no new complaints since last chemotherapy treatments.  Denies any pain.  No nausea or vomiting, or diarrhea.   . Review of Systems  Constitutional: Positive for fatigue. Negative for appetite change, chills, fever and unexpected weight change.  HENT:   Negative for hearing loss and voice change.   Eyes: Negative for eye problems and icterus.  Respiratory: Negative for chest tightness, cough and shortness of breath.   Cardiovascular: Negative for chest pain and leg swelling.  Gastrointestinal: Negative for abdominal distention and abdominal pain.  Endocrine: Negative for hot flashes.  Genitourinary: Negative for difficulty urinating, dysuria and frequency.   Musculoskeletal: Negative for arthralgias.  Skin: Negative for itching and rash.  Neurological: Positive for numbness. Negative for light-headedness.  Hematological: Negative for adenopathy. Does not bruise/bleed easily.  Psychiatric/Behavioral: Negative for confusion.    MEDICAL HISTORY:  Past Medical History:  Diagnosis Date  . Allergic  rhinitis   . Cancer [facility])    new  dx Pancreatic Cancer - Aug 2020  . CHF (congestive heart failure) ([place])   . Chronic cholecystitis   . Coronary artery disease   . DDD (degenerative disc disease), cervical   . Dehydration 06/21/2019  . Diabetes mellitus without complication ([place])   . Dyspnea   . Early cataract   . Erectile dysfunction   . GERD (gastroesophageal reflux disease)   . Gouty arthritis   . Headache    occasional migraines  . Hypercalcemia   . Hyperlipemia   . Hypertension   . Palpitations   . Peripheral vascular disease ([place])   . 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   LIMA to LAD SVG to [place] SVG SEQUENTIALLY to OM1 and OM2 SVG to ACUTE MARGINAL  . Spinal stenosis of cervical region   . Vitamin D deficiency     SURGICAL HISTORY: Past Surgical History:  Procedure Laterality Date  . BACK SURGERY  2018    fusion with screws  . CHOLECYSTECTOMY N/A 11/13/2018   Procedure: LAPAROSCOPIC CHOLECYSTECTOMY;  Surgeon: Vickie Epley, MD;  Location: [facility] ORS;  Service: General;  Laterality: N/A;  . COLONOSCOPY    . CORONARY ARTERY BYPASS GRAFT N/A 09/30/2017   Procedure: CORONARY ARTERY BYPASS GRAFTING times five using right and left Saphaneous vein harvested endoscopicly  and left internal mammary artery. (CABG),TEE;  Surgeon: Rexene Alberts, MD;  Location: [place];  Service: Open Heart Surgery;  Laterality: N/A;  . LEFT HEART CATH AND CORONARY ANGIOGRAPHY Left 09/06/2017   Procedure: LEFT HEART CATH AND CORONARY ANGIOGRAPHY;  Surgeon: Corey Skains, MD;  Location: [place] CV LAB;  Service: Cardiovascular;  Laterality: Left;  . percutaneous transluminal balloon angioplasty  01/2010   of left lower extremity  . PORTA CATH INSERTION N/A 06/06/2019   Procedure: PORTA CATH INSERTION;  Surgeon: Katha Cabal, MD;  Location: [place] [facility];  Service: Cardiovascular;  Laterality: N/A;  . TEE WITHOUT CARDIOVERSION N/A 09/30/2017   Procedure: TRANSESOPHAGEAL  ECHOCARDIOGRAM (TEE);  Surgeon: Rexene Alberts, MD;  Location: [place];  Service: Open Heart Surgery;  Laterality: N/A;  . VASCULAR SURGERY Left 2010   left exernal iliac and superficial femoral artery PTCA and stenting    SOCIAL HISTORY: Social History   Socioeconomic History  . Marital status: Married    Spouse name: Enid Derry  . Number of children: 2  . Years of education: Not on file  . Highest education level: Not on file  Occupational History  . Occupation: drove tractors    Comment: retired  Tobacco Use  . Smoking status: Former Smoker    Packs/day: 0.75    Years: 50.00    Pack years: 37.50    Types: Cigarettes    Quit date: 09/27/2017    Years since quitting: 2.3  . Smokeless tobacco: Former Systems developer    Types: Chew  Substance and Sexual Activity  . Alcohol use: Not Currently    Comment: stopped drinking before cabg  . Drug use: Not Currently    Types: Marijuana    Comment: stopped smoking before CABG  . Sexual activity: Not Currently  Other Topics Concern  . Not on file  Social History Narrative  . Not on file   Social Determinants of Health   Financial Resource Strain: Low Risk   . Difficulty of Paying Living Expenses: Not hard at all  Food Insecurity: No Food Insecurity  . Worried About Charity fundraiser in the Last Year: Never true  . Ran  Out of Food in the Last Year: Never true  Transportation Needs: No Transportation Needs  . Lack of Transportation (Medical): No  . Lack of Transportation (Non-Medical): No  Physical Activity: Sufficiently Active  . Days of Exercise per Week: 5 days  . Minutes of Exercise per Session: 150+ min  Stress: No Stress Concern Present  . Feeling of Stress : Not at all  Social Connections: Somewhat Isolated  . Frequency of Communication with Friends and Family: More than three times a week  . Frequency of Social Gatherings with Friends and Family: More than three times a week  . Attends Religious Services: Never  . Active  Member of Clubs or Organizations: No  . Attends Archivist Meetings: Never  . Marital Status: Married  Human resources officer Violence:   . Fear of Current or Ex-Partner:   . Emotionally Abused:   [name] Kitchen Physically Abused:   . Sexually Abused:     FAMILY HISTORY: Family History  Problem Relation Age of Onset  . Brain cancer Mother   . Emphysema Father   . Cancer Brother     ALLERGIES:  is allergic to lipitor [atorvastatin]; zetia [ezetimibe]; lisinopril; protonix [pantoprazole]; and spironolactone.  MEDICATIONS:  Current Outpatient Medications  Medication Sig Dispense Refill  . aspirin EC 81 MG tablet Take 81 mg by mouth daily.    . carvedilol (COREG) 6.25 MG tablet Take 6.25 mg by mouth 2 (two) times daily with a meal.  3  . Continuous Blood Gluc Receiver (FREESTYLE LIBRE 14 DAY READER) DEVI 1 Device by Does not apply route every 14 (fourteen) days. 6 each 1  . diltiazem (CARDIZEM CD) 300 MG 24 hr capsule TAKE 1 CAPSULE BY MOUTH EVERY DAY 90 capsule 1  . dorzolamide-timolol (COSOPT) 22.3-6.8 MG/ML ophthalmic solution INSTILL ONE DROP INTO BOTH EYES TWICE DAILY    . ferrous sulfate 325 (65 FE) MG tablet TAKE 1 TABLET BY MOUTH EVERY DAY WITH BREAKFAST 90 tablet 1  . gabapentin (NEURONTIN) 300 MG capsule TAKE 2 CAPSULES (600 MG TOTAL) BY MOUTH 3 (THREE) TIMES DAILY. 540 capsule 0  . ibuprofen (ADVIL) 800 MG tablet Take 1 tablet (800 mg total) by mouth every 8 (eight) hours as needed. 30 tablet 0  . insulin glargine (LANTUS SOLOSTAR) 100 UNIT/ML Solostar Pen Inject 20 Units into the skin at bedtime. 15 mL 0  . insulin lispro (HUMALOG) 100 UNIT/ML KwikPen Inject 0.05 mLs (5 Units total) into the skin 3 (three) times daily with meals. 15 mL 11  . Insulin Pen Needle (PEN NEEDLES) 32G X 5 MM MISC Use four times daily for insulin 400 each 1  . latanoprost (XALATAN) 0.005 % ophthalmic solution Place 1 drop into both eyes at bedtime.   3  . lidocaine-prilocaine (EMLA) cream APPLY TO AFFECTED  AREA ONCE AS DIRECTED    . Multiple Vitamin (MULTIVITAMIN WITH MINERALS) TABS tablet Take 1 tablet by mouth daily.    . OMEGA-3 FATTY ACIDS PO Take 1,200 mg by mouth daily.     [name] Kitchen omeprazole (PRILOSEC) 40 MG capsule TAKE 1 CAPSULE BY MOUTH EVERY DAY 90 capsule 1  . ONETOUCH VERIO test strip TEST FASTING SUGAR EVERY MORNING 100 strip 3  . loperamide (IMODIUM A-D) 2 MG tablet Take 1 tablet (2 mg total) by mouth See admin instructions. Take 2 at diarrhea onset , then 1 every 2hr until 12hrs with no BM. May take 2 every 4hrs at night. If diarrhea recurs repeat. (Patient not taking: Reported on 02/08/2020)  100 tablet 1  . oxyCODONE (OXY IR/ROXICODONE) 5 MG immediate release tablet Take 1 tablet (5 mg total) by mouth every 6 (six) hours as needed for severe pain. (Patient not taking: Reported on 02/18/2020) 30 tablet 0  . potassium chloride SA (KLOR-CON) 20 MEQ tablet Take 1 tablet (20 mEq total) by mouth daily. (Patient not taking: Reported on 02/18/2020) 7 tablet 0   No current facility-administered medications for this visit.   Facility-Administered Medications Ordered in Other Visits  Medication Dose Route Frequency Provider Last Rate Last Admin  . sodium chloride flush (NS) 0.9 % injection 10 mL  10 mL Intracatheter PRN Earlie Server, MD      . sodium chloride flush (NS) 0.9 % injection 10 mL  10 mL Intravenous Once Earlie Server, MD         PHYSICAL EXAMINATION: ECOG PERFORMANCE STATUS: 1 - Symptomatic but completely ambulatory Vitals:   02/18/20 0840  BP: 134/78  Pulse: 80  Resp: 18  Temp: (!) 96.4 F (35.8 C)   Filed Weights   02/18/20 0840  Weight: 168 lb 3.2 oz (76.3 kg)    Physical Exam Constitutional:      General: He is not in acute distress. HENT:     Head: Normocephalic and atraumatic.  Eyes:     General: No scleral icterus. Cardiovascular:     Rate and Rhythm: Normal rate and regular rhythm.     Heart sounds: Normal heart sounds.  Pulmonary:     Effort: Pulmonary effort is  normal. No respiratory distress.     Breath sounds: No wheezing.  Abdominal:     General: Bowel sounds are normal. There is no distension.     Palpations: Abdomen is soft.  Musculoskeletal:        General: No deformity. Normal range of motion.     Cervical back: Normal range of motion and neck supple.  Skin:    General: Skin is warm and dry.     Findings: No erythema or rash.  Neurological:     Mental Status: He is alert and oriented to person, place, and time. Mental status is at baseline.     Cranial Nerves: No cranial nerve deficit.     Coordination: Coordination normal.  Psychiatric:        Mood and Affect: Mood normal.     LABORATORY DATA:  I have reviewed the data as listed Lab Results  Component Value Date   WBC 4.9 02/18/2020   HGB 10.3 (L) 02/18/2020   HCT 32.4 (L) 02/18/2020   MCV 91.3 02/18/2020   PLT 236 02/18/2020   Recent Labs    01/29/20 1429 02/04/20 0837 02/11/20 1030  NA 137 141 140  K 4.5 3.1* 4.3  CL 103 106 108  CO2 19* 24 23  GLUCOSE 146* 133* 79  BUN '20 10 18  '$ CREATININE 1.08 1.01 0.99  CALCIUM 9.1 8.5* 8.4*  GFRNONAA 68 >60 >60  GFRAA 78 >60 >60  PROT 6.1 6.4* 6.2*  ALBUMIN 3.6* 3.2* 3.4*  AST 57* 35 23  ALT 79* 63* 38  ALKPHOS 98 81 79  BILITOT 0.5 0.8 0.7   Iron/TIBC/Ferritin/ %Sat    Component Value Date/Time   IRON 119 04/27/2019 0940   TIBC 363 04/27/2019 0940   FERRITIN 58 04/27/2019 0940   IRONPCTSAT 33 04/27/2019 0940      RADIOGRAPHIC STUDIES: I have personally reviewed the radiological images as listed and agreed with the findings in the report. CT CHEST W  CONTRAST  Result Date: 01/02/2020 CLINICAL DATA:  74 year old male with history of pancreatic cancer with liver metastasis. Elevated PSA. EXAM: CT CHEST, ABDOMEN, AND PELVIS WITH CONTRAST TECHNIQUE: Multidetector CT imaging of the chest, abdomen and pelvis was performed following the standard protocol during bolus administration of intravenous contrast. CONTRAST:   169m OMNIPAQUE IOHEXOL 300 MG/ML  SOLN COMPARISON:  CT of the chest abdomen pelvis dated 11/05/2019. FINDINGS: CT CHEST FINDINGS Cardiovascular: There is no cardiomegaly or pericardial effusion. Three-vessel coronary vascular calcification and postsurgical changes of CABG. There is advanced atherosclerotic calcification of the thoracic aorta. No aneurysmal dilatation or dissection. The origins of the great vessels of the aortic arch appear patent as visualized. The central pulmonary arteries appear patent for the degree of opacification. Mediastinum/Nodes: No hilar or mediastinal adenopathy. The esophagus is grossly unremarkable. Several subcentimeter hypodense thyroid nodules. No followup recommended (Ref: J Am Coll Radiol. 2015 Feb;12(2): 143-50). No mediastinal fluid collection. Lungs/Pleura: There is a 4.2 x 3.0 cm ovoid soft tissue mass involving the medial left lung base similar to prior CT. 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 Several small subpleural nodules primarily in the right lung. A 3 mm right upper lobe nodule (series 5, image 64) similar or minimally increased in size. There is a 5 mm subpleural nodule in the right lower lobe (series 5, image 99) new since the prior CT. Additional 5 mm right lung base subpleural nodule (series 5, image 14) also appears new since the prior CT. There is an 8 mm nodule at the left lung base (series 5, image 111) which has significantly increased in size since the prior CT. A 4 mm left lower lobe nodule (series 5, image 103) is new since the prior CT. These nodules are concerning for metastatic disease. There is no focal consolidation, pleural effusion, pneumothorax. The central airways are patent. Musculoskeletal: Median sternotomy wires. Right pectoral Port-A-Cath with tip at the cavoatrial junction. There is osteopenia with degenerative changes of the spine. No acute osseous pathology. No definite suspicious osseous lesions.  CT ABDOMEN PELVIS FINDINGS No intra-abdominal free air or free fluid. Hepatobiliary: There is a background of fatty infiltration of the liver. Interval increase in the size and number of hepatic hypodense lesions consistent with progression of metastatic disease. The largest lesion measures approximately 3.4 x 2.8 cm in the right lobe of the liver (previously 2.0 x 1.1 cm). No intrahepatic biliary ductal dilatation. Cholecystectomy. Pancreas: There is a 4.0 x 3.0 cm (previously 3.5 x 2.8 cm) soft tissue lesion in the region of the uncinate process of the pancreas (series 3, image 64). There is mild diffuse pancreatic atrophy and dilatation of the main pancreatic duct measuring up to 5 mm in diameter and progressed since the prior CT. There is mild haziness of the peripancreatic fat, likely reactive. Spleen: Normal in size without focal abnormality. Adrenals/Urinary Tract: The adrenal glands are unremarkable. There is no hydronephrosis on either side. Bilateral renal cysts measuring up to 4.3 cm in the inferior pole of the right kidney. There is symmetric enhancement and excretion of contrast by both kidneys. The visualized ureters and urinary bladder appear unremarkable. Stomach/Bowel: There is moderate stool throughout the colon. No bowel obstruction or active inflammation. The appendix is normal. Vascular/Lymphatic: Advanced aortoiliac atherosclerotic disease. The IVC is grossly unremarkable. Probable mild narrowing of the porta splenic confluence secondary to pancreatic head mass. The SMV, splenic vein, and main portal vein are patent. No portal venous gas. Mildly  enlarged pancreatico caval lymph node measures 15 mm (series 3, image 59). Reproductive: Mildly enlarged prostate gland. The seminal vesicles are symmetric. Other: None Musculoskeletal: Osteopenia with degenerative changes of the spine. L4-L5 posterior fusion and disc spacer. No acute osseous pathology. IMPRESSION: 1. Overall progression of hepatic and  pulmonary metastatic disease since the prior CT of 11/05/2019. 2. Interval increase in the size of the pancreatic head mass and pancreatic duct dilatation. 3. A 15 mm peripancreatic lymph node, increased in size since the prior CT. 4. Ovoid soft tissue mass in the medial left lung base, similar to prior CT. 5. 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 6. No bowel obstruction. Normal appendix. 7. Aortic Atherosclerosis (ICD10-I70.0). Electronically Signed   By: Anner Crete M.D.   On: 01/02/2020 18:49   CT ABDOMEN PELVIS W CONTRAST  Result Date: 01/02/2020 CLINICAL DATA:  74 year old male with history of pancreatic cancer with liver metastasis. Elevated PSA. EXAM: CT CHEST, ABDOMEN, AND PELVIS WITH CONTRAST TECHNIQUE: Multidetector CT imaging of the chest, abdomen and pelvis was performed following the standard protocol during bolus administration of intravenous contrast. CONTRAST:  131m OMNIPAQUE IOHEXOL 300 MG/ML  SOLN COMPARISON:  CT of the chest abdomen pelvis dated 11/05/2019. FINDINGS: CT CHEST FINDINGS Cardiovascular: There is no cardiomegaly or pericardial effusion. Three-vessel coronary vascular calcification and postsurgical changes of CABG. There is advanced atherosclerotic calcification of the thoracic aorta. No aneurysmal dilatation or dissection. The origins of the great vessels of the aortic arch appear patent as visualized. The central pulmonary arteries appear patent for the degree of opacification. Mediastinum/Nodes: No hilar or mediastinal adenopathy. The esophagus is grossly unremarkable. Several subcentimeter hypodense thyroid nodules. No followup recommended (Ref: J Am Coll Radiol. 2015 Feb;12(2): 143-50). No mediastinal fluid collection. Lungs/Pleura: There is a 4.2 x 3.0 cm ovoid soft tissue mass involving the medial left lung base similar to prior CT. 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 Several small subpleural  nodules primarily in the right lung. A 3 mm right upper lobe nodule (series 5, image 64) similar or minimally increased in size. There is a 5 mm subpleural nodule in the right lower lobe (series 5, image 99) new since the prior CT. Additional 5 mm right lung base subpleural nodule (series 5, image 14) also appears new since the prior CT. There is an 8 mm nodule at the left lung base (series 5, image 111) which has significantly increased in size since the prior CT. A 4 mm left lower lobe nodule (series 5, image 103) is new since the prior CT. These nodules are concerning for metastatic disease. There is no focal consolidation, pleural effusion, pneumothorax. The central airways are patent. Musculoskeletal: Median sternotomy wires. Right pectoral Port-A-Cath with tip at the cavoatrial junction. There is osteopenia with degenerative changes of the spine. No acute osseous pathology. No definite suspicious osseous lesions. CT ABDOMEN PELVIS FINDINGS No intra-abdominal free air or free fluid. Hepatobiliary: There is a background of fatty infiltration of the liver. Interval increase in the size and number of hepatic hypodense lesions consistent with progression of metastatic disease. The largest lesion measures approximately 3.4 x 2.8 cm in the right lobe of the liver (previously 2.0 x 1.1 cm). No intrahepatic biliary ductal dilatation. Cholecystectomy. Pancreas: There is a 4.0 x 3.0 cm (previously 3.5 x 2.8 cm) soft tissue lesion in the region of the uncinate process of the pancreas (series 3, image 64). There is mild diffuse pancreatic atrophy and  dilatation of the main pancreatic duct measuring up to 5 mm in diameter and progressed since the prior CT. There is mild haziness of the peripancreatic fat, likely reactive. Spleen: Normal in size without focal abnormality. Adrenals/Urinary Tract: The adrenal glands are unremarkable. There is no hydronephrosis on either side. Bilateral renal cysts measuring up to 4.3 cm in the  inferior pole of the right kidney. There is symmetric enhancement and excretion of contrast by both kidneys. The visualized ureters and urinary bladder appear unremarkable. Stomach/Bowel: There is moderate stool throughout the colon. No bowel obstruction or active inflammation. The appendix is normal. Vascular/Lymphatic: Advanced aortoiliac atherosclerotic disease. The IVC is grossly unremarkable. Probable mild narrowing of the porta splenic confluence secondary to pancreatic head mass. The SMV, splenic vein, and main portal vein are patent. No portal venous gas. Mildly enlarged pancreatico caval lymph node measures 15 mm (series 3, image 59). Reproductive: Mildly enlarged prostate gland. The seminal vesicles are symmetric. Other: None Musculoskeletal: Osteopenia with degenerative changes of the spine. L4-L5 posterior fusion and disc spacer. No acute osseous pathology. IMPRESSION: 1. Overall progression of hepatic and pulmonary metastatic disease since the prior CT of 11/05/2019. 2. Interval increase in the size of the pancreatic head mass and pancreatic duct dilatation. 3. A 15 mm peripancreatic lymph node, increased in size since the prior CT. 4. Ovoid soft tissue mass in the medial left lung base, similar to prior CT. 5. 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 6. No bowel obstruction. Normal appendix. 7. Aortic Atherosclerosis (ICD10-I70.0). Electronically Signed   By: Anner Crete M.D.   On: 01/02/2020 18:49      ASSESSMENT & PLAN:  1. Pancreatic cancer metastasized to liver ([place])   2. Encounter for antineoplastic chemotherapy   3. Anemia due to antineoplastic chemotherapy   4. Hyperglycemia   5. Hypokalemia     #Metastatic pancreatic cancer- liver mets, hilar nodal metastasis, ?bone mets, CA 19-9 trending down to [id number] Today's tumor marker is still pending Labs are reviewed and discussed with patient.   Okay to proceed 5-FU/liposomal irinotecan.  #Hypokalemia, K  is 3.4, recommend patient to take oral KCL 80mq daily. Rx was sent to pharmacy.   #  diabetes/ hyperglycemia, hyperglycemia is better controlled now on insulin regimen Continue current regimen  #Normocytic anemia, likely secondary to chemo.  Hemoglobin 10.7.  Stable.  #All questions were answered. The patient knows to call the clinic with any problems questions or concerns. Follow-up  2 weeks.   ZEarlie Server MD, PhD Hematology Oncology [facility]5/07/2020

## 2020-02-18 NOTE — Progress Notes (Signed)
Patient denies problems/concerns today.   

## 2020-02-20 ENCOUNTER — Other Ambulatory Visit: Payer: Self-pay

## 2020-02-20 ENCOUNTER — Inpatient Hospital Stay: Payer: Medicare Other

## 2020-02-20 VITALS — BP 130/67 | HR 69 | Temp 96.2°F | Resp 20

## 2020-02-20 DIAGNOSIS — Z5111 Encounter for antineoplastic chemotherapy: Secondary | ICD-10-CM | POA: Diagnosis not present

## 2020-02-20 DIAGNOSIS — C259 Malignant neoplasm of pancreas, unspecified: Secondary | ICD-10-CM

## 2020-02-20 DIAGNOSIS — C787 Secondary malignant neoplasm of liver and intrahepatic bile duct: Secondary | ICD-10-CM

## 2020-02-20 MED ORDER — HEPARIN SOD (PORK) LOCK FLUSH 100 UNIT/ML IV SOLN
500.0000 [IU] | Freq: Once | INTRAVENOUS | Status: AC | PRN
  Administered 2020-02-20: 500 [IU]
  Filled 2020-02-20: qty 5

## 2020-02-20 MED ORDER — HEPARIN SOD (PORK) LOCK FLUSH 100 UNIT/ML IV SOLN
INTRAVENOUS | Status: AC
  Filled 2020-02-20: qty 5

## 2020-02-20 MED ORDER — SODIUM CHLORIDE 0.9% FLUSH
10.0000 mL | INTRAVENOUS | Status: DC | PRN
  Administered 2020-02-20: 10 mL
  Filled 2020-02-20: qty 10

## 2020-02-25 NOTE — Patient Instructions (Addendum)
Thank you for allowing the Chronic Care Management team to participate in your care.  Goals Addressed            This Visit's Progress   . Chronic Disease Management       CARE PLAN ENTRY (see longitudinal plan of care for additional care plan information)  Current Barriers:  . Chronic Disease Management support and education needs related to Hypertension, Coronary Artery Disease, Diabetes, GERD and pancreatic cancer.  Nurse Case Manager Clinical Goal(s):  [name] Kitchen Over the next 90 days, patient will not experience unplanned hospital admission. . Over the next 90 days, patient will take all medications as prescribed. . Over the next 90 days, patient will attend all scheduled medical appointments. . Over the next 90 days, patient will follow provider recommendations regarding blood glucose monitoring. . Over the next 30 days, patient will obtain prescribed Freestyle Libre continuous glucose monitoring device. . Over the next 90 days, patient will work with care management team and provide updates regarding goals of care and changes in health needs.   Interventions:  . Inter-disciplinary care team collaboration (see longitudinal plan of care) . Reviewed medications and discussed indications for use. Encouraged to take all medications as prescribed and inform team with concerns regarding prescription costs. Reports self administering medications. Denies current need for assistance with medication management or costs. . Provided additional education regarding blood glucose parameters along with recommended interventions. Discussed indications for seeking immediate medical attention. Encouraged to continue monitoring and recording readings until [facility] device is obtained.  . Reviewed pending appointments. Encouraged to attend appointments as scheduled to prevent delays in care. Encouraged to update care management team with concerns regarding transportation.  . Discussed plans for ongoing  care management and follow up. Provided direct contact information for care management team. Reports good family support. Declines current need for additional assistance in the home.   Patient Self Care Activities:  . Self administers medications . Attends scheduled provider appointments . Calls pharmacy for medication refills . Attends church or other social activities . Performs ADL's independently . Performs IADL's independently . Calls provider office for new concerns or questions   Initial goal documentation        Mr. Sobieski was given information about Chronic Care Management services  including:  1. CCM service includes personalized support from designated clinical staff supervised by his physician, including individualized plan of care and coordination with other care providers 2. 24/7 contact phone numbers for assistance for urgent and routine care needs. 3. Service will only be billed when office clinical staff spend 20 minutes or more in a month to coordinate care. 4. Only one practitioner may furnish and bill the service in a calendar month. 5. The patient may stop CCM services at any time (effective at the end of the month) by phone call to the office staff. 6. The patient will be responsible for cost sharing (co-pay) of up to 20% of the service fee (after annual deductible is met).  Patient agreed to services and verbal consent obtained.   Mr. Zajicek verbalized understanding of the instructions provided during the telephonic outreach today. Declined need for a printed/mailed copy of the instructions.   The care management team will follow-up with Mr. Coaxum within the next 30 days.   [name] [facility] Care Management [phone]

## 2020-02-25 NOTE — Progress Notes (Signed)
Pharmacist Chemotherapy Monitoring - Follow Up Assessment    I verify that I have reviewed each item in the below checklist:  . Regimen for the patient is scheduled for the appropriate day and plan matches scheduled date. [name] Kitchen Appropriate non-routine labs are ordered dependent on drug ordered. . If applicable, additional medications reviewed and ordered per protocol based on lifetime cumulative doses and/or treatment regimen.   Plan for follow-up and/or issues identified: No . I-vent associated with next due treatment: No . MD and/or nursing notified: No  Craig [name] 02/25/2020 8:04 AM

## 2020-02-25 NOTE — Progress Notes (Signed)
Subjective:   Craig [name] is a 74 y.o. male who presents for Medicare Annual/Subsequent preventive examination.  Review of Systems:  N/A  Cardiac Risk Factors include: advanced age (>38men, >14 women);diabetes mellitus;dyslipidemia;male gender;hypertension     Objective:    Vitals: BP (!) 134/58 (BP Location: Right Arm)   Pulse 78   Temp 98.1 F (36.7 C) (Oral)   Ht 5\' 9"  (1.753 m)   Wt 172 lb (78 kg)   BMI 25.40 kg/m   Body mass index is 25.4 kg/m.  Advanced Directives 02/26/2020 02/18/2020 02/04/2020 01/24/2020 01/21/2020 01/20/2020 01/15/2020  Does Patient Have a Medical Advance Directive? No No No No No No No  Would patient like information on creating a medical advance directive? No - Patient declined No - Patient declined - No - Patient declined - No - Patient declined No - Patient declined    Tobacco Social History   Tobacco Use  Smoking Status Former Smoker  . Packs/day: 0.75  . Years: 50.00  . Pack years: 37.50  . Types: Cigarettes  . Quit date: 09/27/2017  . Years since quitting: 2.4  Smokeless Tobacco Former Systems developer  . Types: Chew     Counseling given: Not Answered   Clinical Intake:  Pre-visit preparation completed: Yes  Pain : No/denies pain Pain Score: 0-No pain     Nutritional Status: BMI of 19-24  Normal Nutritional Risks: None Diabetes: Yes  How often do you need to have someone help you when you read instructions, pamphlets, or other written materials from your doctor or pharmacy?: 1 - Never   Diabetes:  Is the patient diabetic?  Yes  If diabetic, was a CBG obtained today?  No  Did the patient bring in their glucometer from home?  No  How often do you monitor your CBG's? Four to five times daily.   Financial Strains and Diabetes Management:  Are you having any financial strains with the device, your supplies or your medication? No .  Does the patient want to be seen by Chronic Care Management for management of their diabetes?  No    Would the patient like to be referred to a Nutritionist or for Diabetic Management?  No   Diabetic Exams:  Diabetic Eye Exam: 01/2020 per patient. Requested records to be faxed to clinic.   Diabetic Foot Exam: Currently due. Pt has been advised about the importance in completing this exam. Note made to follow up on this at next in office apt.   Interpreter Needed?: No  Information entered by :: [facility], LPN  Past Medical History:  Diagnosis Date  . Allergic rhinitis   . Cancer [facility])    new dx Pancreatic Cancer - Aug 2020  . CHF (congestive heart failure) ([place])   . Chronic cholecystitis   . Coronary artery disease   . DDD (degenerative disc disease), cervical   . Dehydration 06/21/2019  . Diabetes mellitus without complication ([place])   . Dyspnea   . Early cataract   . Erectile dysfunction   . GERD (gastroesophageal reflux disease)   . Gouty arthritis   . Headache    occasional migraines  . Hypercalcemia   . Hyperlipemia   . Hypertension   . Palpitations   . Peripheral vascular disease ([place])   . 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 Spinal stenosis of cervical region   . Vitamin D deficiency  Past Surgical History:  Procedure Laterality Date  . BACK SURGERY  2018    fusion with screws  . CHOLECYSTECTOMY N/A 11/13/2018   Procedure: LAPAROSCOPIC CHOLECYSTECTOMY;  Surgeon: Vickie Epley, MD;  Location: [facility] ORS;  Service: General;  Laterality: N/A;  . COLONOSCOPY    . CORONARY ARTERY BYPASS GRAFT N/A 09/30/2017   Procedure: CORONARY ARTERY BYPASS GRAFTING times five using right and left Saphaneous vein harvested endoscopicly  and left internal mammary artery. (CABG),TEE;  Surgeon: Rexene Alberts, MD;  Location: [facility];  Service: Open Heart Surgery;  Laterality: N/A;  . LEFT HEART CATH AND CORONARY ANGIOGRAPHY Left 09/06/2017   Procedure: LEFT HEART CATH AND CORONARY ANGIOGRAPHY;   Surgeon: Corey Skains, MD;  Location: [place] [facility];  Service: Cardiovascular;  Laterality: Left;  . percutaneous transluminal balloon angioplasty  01/2010   of left lower extremity  . PORTA CATH INSERTION N/A 06/06/2019   Procedure: PORTA CATH INSERTION;  Surgeon: Katha Cabal, MD;  Location: [place] [facility];  Service: Cardiovascular;  Laterality: N/A;  . TEE WITHOUT CARDIOVERSION N/A 09/30/2017   Procedure: TRANSESOPHAGEAL ECHOCARDIOGRAM (TEE);  Surgeon: Rexene Alberts, MD;  Location: [facility];  Service: Open Heart Surgery;  Laterality: N/A;  . VASCULAR SURGERY Left 2010   left exernal iliac and superficial femoral artery PTCA and stenting   Family History  Problem Relation Age of Onset  . Brain cancer Mother   . Emphysema Father   . Cancer Brother    Social History   Socioeconomic History  . Marital status: Married    Spouse name: Enid Derry  . Number of children: 2  . Years of education: Not on file  . Highest education level: 5th grade  Occupational History  . Occupation: drove tractors    Comment: retired  Tobacco Use  . Smoking status: Former Smoker    Packs/day: 0.75    Years: 50.00    Pack years: 37.50    Types: Cigarettes    Quit date: 09/27/2017    Years since quitting: 2.4  . Smokeless tobacco: Former Systems developer    Types: Chew  Substance and Sexual Activity  . Alcohol use: Not Currently    Comment: stopped drinking before cabg  . Drug use: Not Currently    Types: Marijuana    Comment: stopped smoking before CABG  . Sexual activity: Not Currently  Other Topics Concern  . Not on file  Social History Narrative  . Not on file   Social Determinants of Health   Financial Resource Strain: Low Risk   . Difficulty of Paying Living Expenses: Not hard at all  Food Insecurity: No Food Insecurity  . Worried About Charity fundraiser in the Last Year: Never true  . Ran Out of Food in the Last Year: Never true  Transportation Needs: No Transportation  Needs  . Lack of Transportation (Medical): No  . Lack of Transportation (Non-Medical): No  Physical Activity: Inactive  . Days of Exercise per Week: 0 days  . Minutes of Exercise per Session: 0 min  Stress: No Stress Concern Present  . Feeling of Stress : Not at all  Social Connections: Somewhat Isolated  . Frequency of Communication with Friends and Family: More than three times a week  . Frequency of Social Gatherings with Friends and Family: More than three times a week  . Attends Religious Services: Never  . Active Member of Clubs or Organizations: No  . Attends Archivist Meetings:  Never  . Marital Status: Married    Outpatient Encounter Medications as of 02/26/2020  Medication Sig  . aspirin EC 81 MG tablet Take 81 mg by mouth daily.  . carvedilol (COREG) 6.25 MG tablet Take 6.25 mg by mouth 2 (two) times daily with a meal.  . Continuous Blood Gluc Receiver (FREESTYLE LIBRE 14 DAY READER) DEVI 1 Device by Does not apply route every 14 (fourteen) days.  [name] Kitchen diltiazem (CARDIZEM CD) 300 MG 24 hr capsule TAKE 1 CAPSULE BY MOUTH EVERY DAY  . dorzolamide-timolol (COSOPT) 22.3-6.8 MG/ML ophthalmic solution INSTILL ONE DROP INTO BOTH EYES TWICE DAILY  . ferrous sulfate 325 (65 FE) MG tablet TAKE 1 TABLET BY MOUTH EVERY DAY WITH BREAKFAST  . gabapentin (NEURONTIN) 300 MG capsule TAKE 2 CAPSULES (600 MG TOTAL) BY MOUTH 3 (THREE) TIMES DAILY.  [name] Kitchen ibuprofen (ADVIL) 800 MG tablet Take 1 tablet (800 mg total) by mouth every 8 (eight) hours as needed.  . insulin glargine (LANTUS SOLOSTAR) 100 UNIT/ML Solostar Pen Inject 20 Units into the skin at bedtime.  . insulin lispro (HUMALOG) 100 UNIT/ML KwikPen Inject 0.05 mLs (5 Units total) into the skin 3 (three) times daily with meals.  . Insulin Pen Needle (PEN NEEDLES) 32G X 5 MM MISC Use four times daily for insulin  . latanoprost (XALATAN) 0.005 % ophthalmic solution Place 1 drop into both eyes at bedtime.   . lidocaine-prilocaine (EMLA)  cream APPLY TO AFFECTED AREA ONCE AS DIRECTED  . Multiple Vitamin (MULTIVITAMIN WITH MINERALS) TABS tablet Take 1 tablet by mouth daily.  . OMEGA-3 FATTY ACIDS PO Take 1,200 mg by mouth daily.   [name] Kitchen omeprazole (PRILOSEC) 40 MG capsule TAKE 1 CAPSULE BY MOUTH EVERY DAY  . ONETOUCH VERIO test strip TEST FASTING SUGAR EVERY MORNING  . potassium chloride (KLOR-CON) 10 MEQ tablet Take 1 tablet (10 mEq total) by mouth daily.  [name] Kitchen loperamide (IMODIUM A-D) 2 MG tablet Take 1 tablet (2 mg total) by mouth See admin instructions. Take 2 at diarrhea onset , then 1 every 2hr until 12hrs with no BM. May take 2 every 4hrs at night. If diarrhea recurs repeat. (Patient not taking: Reported on 02/08/2020)  . oxyCODONE (OXY IR/ROXICODONE) 5 MG immediate release tablet Take 1 tablet (5 mg total) by mouth every 6 (six) hours as needed for severe pain. (Patient not taking: Reported on 02/18/2020)   Facility-Administered Encounter Medications as of 02/26/2020  Medication  . sodium chloride flush (NS) 0.9 % injection 10 mL  . sodium chloride flush (NS) 0.9 % injection 10 mL    Activities of Daily Living In your present state of health, do you have any difficulty performing the following activities: 02/26/2020 02/12/2020  Hearing? N N  Vision? N N  Difficulty concentrating or making decisions? N -  Walking or climbing stairs? N N  Dressing or bathing? N N  Doing errands, shopping? N N  Preparing Food and eating ? N -  Using the Toilet? N -  In the past six months, have you accidently leaked urine? N -  Do you have problems with loss of bowel control? N -  Managing your Medications? N -  Managing your Finances? N -  Housekeeping or managing your Housekeeping? N -  Some recent data might be hidden    Patient Care Team: Paulene Floor as PCP - General (Physician Assistant) Clent Jacks, RN as Oncology Nurse Navigator Earlie Server, MD as Consulting Physician (Hematology and Oncology) Neldon Labella, RN  as  Case Manager Lonia Farber, MD as Consulting Physician (Internal Medicine)   Assessment:   This is a routine wellness examination for Craig [name].  Exercise Activities and Dietary recommendations Current Exercise Habits: The patient does not participate in regular exercise at present, Exercise limited by: None identified  Goals    . Chronic Disease Management     CARE PLAN ENTRY (see longitudinal plan of care for additional care plan information)  Current Barriers:  . Chronic Disease Management support and education needs related to Hypertension, Coronary Artery Disease, Diabetes, GERD and pancreatic cancer.  Nurse Case Manager Clinical Goal(s):  [name] Kitchen Over the next 90 days, patient will not experience unplanned hospital admission. . Over the next 90 days, patient will take all medications as prescribed. . Over the next 90 days, patient will attend all scheduled medical appointments. . Over the next 90 days, patient will follow provider recommendations regarding blood glucose monitoring. . Over the next 30 days, patient will obtain prescribed Freestyle Libre continuous glucose monitoring device. . Over the next 90 days, patient will work with care management team and provide updates regarding goals of care and changes in health needs.   Interventions:  . Inter-disciplinary care team collaboration (see longitudinal plan of care) . Reviewed medications and discussed indications for use. Encouraged to take all medications as prescribed and inform team with concerns regarding prescription costs. Reports self administering medications. Denies current need for assistance with medication management or costs. . Provided additional education regarding blood glucose parameters along with recommended interventions. Discussed indications for seeking immediate medical attention. Encouraged to continue monitoring and recording readings until [facility] device is obtained.  . Reviewed pending  appointments. Encouraged to attend appointments as scheduled to prevent delays in care. Encouraged to update care management team with concerns regarding transportation.  . Discussed plans for ongoing care management and follow up. Provided direct contact information for care management team. Reports good family support. Declines current need for additional assistance in the home.   Patient Self Care Activities:  . Self administers medications . Attends scheduled provider appointments . Calls pharmacy for medication refills . Attends church or other social activities . Performs ADL's independently . Performs IADL's independently . Calls provider office for new concerns or questions   Initial goal documentation     . DIET - EAT MORE FRUITS AND VEGETABLES     Recommend eating two servings of vegetables every day.       Fall Risk Fall Risk  02/26/2020 02/12/2020 11/28/2018 11/03/2018 10/31/2018  Falls in the past year? 0 0 0 0 0  Number falls in past yr: 0 - 0 - -  Injury with Fall? 0 - 0 - -  Follow up - Falls prevention discussed - - Falls evaluation completed   FALL RISK PREVENTION PERTAINING TO THE HOME:  Any stairs in or around the home? Yes  If so, are there any without handrails? No   Home free of loose throw rugs in walkways, pet beds, electrical cords, etc? Yes  Adequate lighting in your home to reduce risk of falls? Yes   ASSISTIVE DEVICES UTILIZED TO PREVENT FALLS:  Life alert? No  Use of a cane, Kluck or w/c? No  Grab bars in the bathroom? No  Shower chair or bench in shower? No  Elevated toilet seat or a handicapped toilet? Yes    TIMED UP AND GO:  Was the test performed? No .    Depression Screen PHQ 2/9 Scores 02/12/2020  11/03/2018 03/31/2016  PHQ - 2 Score 0 0 0    Cognitive Function: Declined today.         Immunization History  Administered Date(s) Administered  . Fluad Quad(high Dose 65+) 07/09/2019  . Influenza Split 07/29/2007, 07/27/2012  .  Influenza, High Dose Seasonal PF 08/06/2014, 09/10/2015, 08/27/2016, 08/10/2017, 07/04/2018  . Influenza,inj,Quad PF,6+ Mos 07/05/2013  . PFIZER SARS-COV-2 Vaccination 01/11/2020, 02/06/2020  . Pneumococcal Conjugate-13 02/26/2014  . Pneumococcal Polysaccharide-23 11/03/2012  . Tdap 11/03/2012  . Zoster 02/23/2013    Qualifies for Shingles Vaccine? Yes  Zostavax completed 02/23/13. Due for Shingrix. Pt has been advised to call insurance company to determine out of pocket expense. Advised may also receive vaccine at local pharmacy or Health Dept. Verbalized acceptance and understanding.  Tdap: Up to date  Flu Vaccine: Up to date  Pneumococcal Vaccine: Completed series  Screening Tests Health Maintenance  Topic Date Due  . FOOT EXAM  Never done  . OPHTHALMOLOGY EXAM  Never done  . INFLUENZA VACCINE  05/11/2020  . COLONOSCOPY  07/06/2020  . HEMOGLOBIN A1C  07/25/2020  . URINE MICROALBUMIN  01/07/2021  . TETANUS/TDAP  11/03/2022  . COVID-19 Vaccine  Completed  . Hepatitis C Screening  Completed  . PNA vac Low Risk Adult  Completed   Cancer Screenings:  Colorectal Screening: Completed 05/12/17. Repeat every 10 years.   Lung Cancer Screening: (Low Dose CT Chest recommended if Age 43-80 years, 30 pack-year currently smoking OR have quit w/in 15years.) does qualify however completed this 01/02/20. Repeat yearly.  Additional Screening:  Hepatitis C Screening: Up to date  Dental Screening: Recommended annual dental exams for proper oral hygiene  Community Resource Referral:  CRR required this visit? No      Plan:  I have personally reviewed and addressed the Medicare Annual Wellness questionnaire and have noted the following in the patient's chart:  A. Medical and social history B. Use of alcohol, tobacco or illicit drugs  C. Current medications and supplements D. Functional ability and status E.  Nutritional status F.  Physical activity G. Advance directives H. List of  other physicians I.  Hospitalizations, surgeries, and ER visits in previous 12 months J.  [place] such as hearing and vision if needed, cognitive and depression L. Referrals and appointments   In addition, I have reviewed and discussed with patient certain preventive protocols, quality metrics, and best practice recommendations. A written personalized care plan for preventive services as well as general preventive health recommendations were provided to patient.   Glendora Score, [place]  [id number] Nurse Health Advisor  Nurse Notes: Pt needs a diabetic foot exam at next in office apt. Requested previous eye exam notes to be faxed to clinic.

## 2020-02-26 ENCOUNTER — Ambulatory Visit (INDEPENDENT_AMBULATORY_CARE_PROVIDER_SITE_OTHER): Payer: Medicare Other

## 2020-02-26 ENCOUNTER — Other Ambulatory Visit: Payer: Self-pay

## 2020-02-26 VITALS — BP 134/58 | HR 78 | Temp 98.1°F | Ht 69.0 in | Wt 172.0 lb

## 2020-02-26 DIAGNOSIS — Z0000 Encounter for general adult medical examination without abnormal findings: Secondary | ICD-10-CM

## 2020-02-26 NOTE — Patient Instructions (Signed)
Craig [name] , Thank you for taking time to come for your Medicare Wellness Visit. I appreciate your ongoing commitment to your health goals. Please  review the following plan we discussed and let me know if I can assist you in the future.   Screening recommendations/referrals: Colonoscopy: Up to date, due 05/2027 Recommended yearly ophthalmology/optometry visit for glaucoma screening and checkup Recommended yearly dental visit for hygiene and checkup  Vaccinations: Influenza vaccine: Up to date Pneumococcal vaccine: Completed series Tdap vaccine: Up to date, due 10/2022 Shingles vaccine: Pt declines today.     Advanced directives: Advance directive discussed with you today. Even though you declined this today please call our office should you change your mind and we can give you the proper paperwork for you to fill out.  Conditions/risks identified: Recommend eating two servings of vegetables every day.  Next appointment: 02/27/20 @ 9:40 AM with Carles Collet. Declined scheduling an AWV for 2022 at this time.   Preventive Care 49 Years and Older, Male Preventive care refers to lifestyle choices and visits with your health care provider that can promote health and wellness. What does preventive care include?  A yearly physical exam. This is also called an annual well check.  Dental exams once or twice a year.  Routine eye exams. Ask your health care provider how often you should have your eyes checked.  Personal lifestyle choices, including:  Daily care of your teeth and gums.  Regular physical activity.  Eating a healthy diet.  Avoiding tobacco and drug use.  Limiting alcohol use.  Practicing safe sex.  Taking low doses of aspirin every day.  Taking vitamin and mineral supplements as recommended by your health care provider. What happens during an annual well check? The services and screenings done by your health care provider during your annual well check will depend on  your age, overall health, lifestyle risk factors, and family history of disease. Counseling  Your health care provider may ask you questions about your:  Alcohol use.  Tobacco use.  Drug use.  Emotional well-being.  Home and relationship well-being.  Sexual activity.  Eating habits.  History of falls.  Memory and ability to understand (cognition).  Work and work Statistician. Screening  You may have the following tests or measurements:  Height, weight, and BMI.  Blood pressure.  Lipid and cholesterol levels. These may be checked every 5 years, or more frequently if you are over 49 years old.  Skin check.  Lung cancer screening. You may have this screening every year starting at age 106 if you have a 30-pack-year history of smoking and currently smoke or have quit within the past 15 years.  Fecal occult blood test (FOBT) of the stool. You may have this test every year starting at age 23.  Flexible sigmoidoscopy or colonoscopy. You may have a sigmoidoscopy every 5 years or a colonoscopy every 10 years starting at age 25.  Prostate cancer screening. Recommendations will vary depending on your family history and other risks.  Hepatitis C blood test.  Hepatitis B blood test.  Sexually transmitted disease (STD) testing.  Diabetes screening. This is done by checking your blood sugar (glucose) after you have not eaten for a while (fasting). You may have this done every 1-3 years.  Abdominal aortic aneurysm (AAA) screening. You may need this if you are a current or former smoker.  Osteoporosis. You may be screened starting at age 66 if you are at high risk. Talk with your health care  provider about your test results, treatment options, and if necessary, the need for more tests. Vaccines  Your health care provider may recommend certain vaccines, such as:  Influenza vaccine. This is recommended every year.  Tetanus, diphtheria, and acellular pertussis (Tdap, Td) vaccine.  You may need a Td booster every 10 years.  Zoster vaccine. You may need this after age 65.  Pneumococcal 13-valent conjugate (PCV13) vaccine. One dose is recommended after age 14.  Pneumococcal polysaccharide (PPSV23) vaccine. One dose is recommended after age 38. Talk to your health care provider about which screenings and vaccines you need and how often you need them. This information is not intended to replace advice given to you by your health care provider. Make sure you discuss any questions you have with your health care provider. Document Released: 10/24/2015 Document Revised: 06/16/2016 Document Reviewed: 07/29/2015 Elsevier Interactive Patient Education  2017 [place] Prevention in the Home Falls can cause injuries. They can happen to people of all ages. There are many things you can do to make your home safe and to help prevent falls. What can I do on the outside of my home?  Regularly fix the edges of walkways and driveways and fix any cracks.  Remove anything that might make you trip as you walk through a door, such as a raised step or threshold.  Trim any bushes or trees on the path to your home.  Use bright outdoor lighting.  Clear any walking paths of anything that might make someone trip, such as rocks or tools.  Regularly check to see if handrails are loose or broken. Make sure that both sides of any steps have handrails.  Any raised decks and porches should have guardrails on the edges.  Have any leaves, snow, or ice cleared regularly.  Use sand or salt on walking paths during winter.  Clean up any spills in your garage right away. This includes oil or grease spills. What can I do in the bathroom?  Use night lights.  Install grab bars by the toilet and in the tub and shower. Do not use towel bars as grab bars.  Use non-skid mats or decals in the tub or shower.  If you need to sit down in the shower, use a plastic, non-slip stool.  Keep the  floor dry. Clean up any water that spills on the floor as soon as it happens.  Remove soap buildup in the tub or shower regularly.  Attach bath mats securely with double-sided non-slip rug tape.  Do not have throw rugs and other things on the floor that can make you trip. What can I do in the bedroom?  Use night lights.  Make sure that you have a light by your bed that is easy to reach.  Do not use any sheets or blankets that are too big for your bed. They should not hang down onto the floor.  Have a firm chair that has side arms. You can use this for support while you get dressed.  Do not have throw rugs and other things on the floor that can make you trip. What can I do in the kitchen?  Clean up any spills right away.  Avoid walking on wet floors.  Keep items that you use a lot in easy-to-reach places.  If you need to reach something above you, use a strong step stool that has a grab bar.  Keep electrical cords out of the way.  Do not use floor polish  or wax that makes floors slippery. If you must use wax, use non-skid floor wax.  Do not have throw rugs and other things on the floor that can make you trip. What can I do with my stairs?  Do not leave any items on the stairs.  Make sure that there are handrails on both sides of the stairs and use them. Fix handrails that are broken or loose. Make sure that handrails are as long as the stairways.  Check any carpeting to make sure that it is firmly attached to the stairs. Fix any carpet that is loose or worn.  Avoid having throw rugs at the top or bottom of the stairs. If you do have throw rugs, attach them to the floor with carpet tape.  Make sure that you have a light switch at the top of the stairs and the bottom of the stairs. If you do not have them, ask someone to add them for you. What else can I do to help prevent falls?  Wear shoes that:  Do not have high heels.  Have rubber bottoms.  Are comfortable and fit  you well.  Are closed at the toe. Do not wear sandals.  If you use a stepladder:  Make sure that it is fully opened. Do not climb a closed stepladder.  Make sure that both sides of the stepladder are locked into place.  Ask someone to hold it for you, if possible.  Clearly mark and make sure that you can see:  Any grab bars or handrails.  First and last steps.  Where the edge of each step is.  Use tools that help you move around (mobility aids) if they are needed. These include:  Canes.  Walkers.  Scooters.  Crutches.  Turn on the lights when you go into a dark area. Replace any light bulbs as soon as they burn out.  Set up your furniture so you have a clear path. Avoid moving your furniture around.  If any of your floors are uneven, fix them.  If there are any pets around you, be aware of where they are.  Review your medicines with your doctor. Some medicines can make you feel dizzy. This can increase your chance of falling. Ask your doctor what other things that you can do to help prevent falls. This information is not intended to replace advice given to you by your health care provider. Make sure you discuss any questions you have with your health care provider. Document Released: 07/24/2009 Document Revised: 03/04/2016 Document Reviewed: 11/01/2014 Elsevier Interactive Patient Education  2017 [employer].

## 2020-02-27 ENCOUNTER — Encounter: Payer: Self-pay | Admitting: Physician Assistant

## 2020-02-27 ENCOUNTER — Ambulatory Visit (INDEPENDENT_AMBULATORY_CARE_PROVIDER_SITE_OTHER): Payer: Medicare Other | Admitting: Physician Assistant

## 2020-02-27 VITALS — BP 129/67 | HR 81 | Temp 97.1°F | Resp 16 | Wt 172.0 lb

## 2020-02-27 DIAGNOSIS — I10 Essential (primary) hypertension: Secondary | ICD-10-CM

## 2020-02-27 DIAGNOSIS — E1065 Type 1 diabetes mellitus with hyperglycemia: Secondary | ICD-10-CM | POA: Diagnosis not present

## 2020-02-27 NOTE — Progress Notes (Signed)
I,Joseline E Rosas,acting as a scribe for Trinna Post, PA-C.,have documented all relevant documentation on the behalf of Trinna Post, PA-C,as directed by  Trinna Post, PA-C while in the presence of Trinna Post, PA-C.  Established patient visit   Patient: Craig [name]   DOB: 05-25-1946   74 y.o. Male  MRN: [id number] Visit Date: 02/27/2020  Today's healthcare provider: Trinna Post, PA-C   Chief Complaint  Patient presents with  . Diabetes   Subjective    HPI  Patient is followed by Endocrine with Dr. Honor Junes. Just saw him 02/21/2020 and received free sample of free style libre. Patient's home health nurse got insurance information and is apparently calling [facility] regarding further monitor refills. He has not heard back yet. Does not know name of home health nurse or company.   Diabetes Mellitus Type I, Follow-up  Lab Results  Component Value Date   HGBA1C 10.7 (H) 01/24/2020   HGBA1C 6.7 (H) 12/31/2019   HGBA1C 6.0 (H) 04/27/2019   Wt Readings from Last 3 Encounters:  02/27/20 172 lb (78 kg)  02/26/20 172 lb (78 kg)  02/18/20 168 lb 3.2 oz (76.3 kg)   Last seen for diabetes 19 days ago.  Management since then includes patient was advised to hold off on mealtime insulin if sugars are too low, follow sliding scale as provided . He reports good compliance with treatment. He is having side effects. Hypoglycemia - will eat glucose wafer or sugary food in the situation.  Symptoms: No fatigue No foot ulcerations  No appetite changes No nausea  No paresthesia of the feet  No polydipsia  No polyuria No visual disturbances   No vomiting     Home blood sugar records: fasting range: 120-130's after eating it goes down in 90's  Episodes of hypoglycemia? No    Current insulin regiment: Lantus Solostar 20 units into the skin at bedtime and Humalog sliding scale. Reports that he does the Humalog depending on his sugars levels.    Most Recent Eye Exam: [place] 01/2020 is due to go back for follow up Current exercise: gardening, walking and yard work Current diet habits: in general, an "unhealthy" diet  Pertinent Labs: Lab Results  Component Value Date   CHOL 180 04/27/2019   HDL 47 04/27/2019   LDLCALC 103 (H) 04/27/2019   TRIG 149 04/27/2019   CHOLHDL 3.8 04/27/2019   Lab Results  Component Value Date   NA 140 02/18/2020   K 3.4 (L) 02/18/2020   CREATININE 0.90 02/18/2020   GFRNONAA >60 02/18/2020   GFRAA >60 02/18/2020   GLUCOSE 161 (H) 02/18/2020     Hypertension: Patient reports that his bp is stable. Currently takes cardizem 300 mg daily.  BP Readings from Last 3 Encounters:  02/27/20 129/67  02/26/20 (!) 134/58  02/20/20 130/67   ---------------------------------------------------------------------------------------------------      Medications: Outpatient Medications Prior to Visit  Medication Sig  . aspirin EC 81 MG tablet Take 81 mg by mouth daily.  . carvedilol (COREG) 6.25 MG tablet Take 6.25 mg by mouth 2 (two) times daily with a meal.  . Continuous Blood Gluc Receiver (FREESTYLE LIBRE 14 DAY READER) DEVI 1 Device by Does not apply route every 14 (fourteen) days.  [name] Kitchen diltiazem (CARDIZEM CD) 300 MG 24 hr capsule TAKE 1 CAPSULE BY MOUTH EVERY DAY  . dorzolamide-timolol (COSOPT) 22.3-6.8 MG/ML ophthalmic solution INSTILL ONE DROP INTO BOTH EYES TWICE DAILY  . ferrous sulfate  325 (65 FE) MG tablet TAKE 1 TABLET BY MOUTH EVERY DAY WITH BREAKFAST  . gabapentin (NEURONTIN) 300 MG capsule TAKE 2 CAPSULES (600 MG TOTAL) BY MOUTH 3 (THREE) TIMES DAILY.  [name] Kitchen ibuprofen (ADVIL) 800 MG tablet Take 1 tablet (800 mg total) by mouth every 8 (eight) hours as needed.  . insulin glargine (LANTUS SOLOSTAR) 100 UNIT/ML Solostar Pen Inject 20 Units into the skin at bedtime.  . insulin lispro (HUMALOG) 100 UNIT/ML KwikPen Inject 0.05 mLs (5 Units total) into the skin 3 (three) times daily with meals.  . Insulin  Pen Needle (PEN NEEDLES) 32G X 5 MM MISC Use four times daily for insulin  . latanoprost (XALATAN) 0.005 % ophthalmic solution Place 1 drop into both eyes at bedtime.   . lidocaine-prilocaine (EMLA) cream APPLY TO AFFECTED AREA ONCE AS DIRECTED  . loperamide (IMODIUM A-D) 2 MG tablet Take 1 tablet (2 mg total) by mouth See admin instructions. Take 2 at diarrhea onset , then 1 every 2hr until 12hrs with no BM. May take 2 every 4hrs at night. If diarrhea recurs repeat.  . Multiple Vitamin (MULTIVITAMIN WITH MINERALS) TABS tablet Take 1 tablet by mouth daily.  . OMEGA-3 FATTY ACIDS PO Take 1,200 mg by mouth daily.   [name] Kitchen omeprazole (PRILOSEC) 40 MG capsule TAKE 1 CAPSULE BY MOUTH EVERY DAY  . ONETOUCH VERIO test strip TEST FASTING SUGAR EVERY MORNING  . oxyCODONE (OXY IR/ROXICODONE) 5 MG immediate release tablet Take 1 tablet (5 mg total) by mouth every 6 (six) hours as needed for severe pain. (Patient not taking: Reported on 02/18/2020)  . potassium chloride (KLOR-CON) 10 MEQ tablet Take 1 tablet (10 mEq total) by mouth daily. (Patient not taking: Reported on 02/27/2020)   Facility-Administered Medications Prior to Visit  Medication Dose Route Frequency Provider  . sodium chloride flush (NS) 0.9 % injection 10 mL  10 mL Intracatheter PRN Earlie Server, MD  . sodium chloride flush (NS) 0.9 % injection 10 mL  10 mL Intravenous Once Earlie Server, MD    Review of Systems  Constitutional: Negative.   Respiratory: Negative.   Cardiovascular: Negative.   Hematological: Negative.       Objective    BP 129/67 (BP Location: Right Arm, Patient Position: Sitting, Cuff Size: Normal)   Pulse 81   Temp (!) 97.1 F (36.2 C) (Temporal)   Resp 16   Wt 172 lb (78 kg)   BMI 25.40 kg/m    Physical Exam Constitutional:      Appearance: Normal appearance.  Cardiovascular:     Rate and Rhythm: Normal rate and regular rhythm.     Heart sounds: Normal heart sounds.  Pulmonary:     Effort: Pulmonary effort is  normal.     Breath sounds: Normal breath sounds.  Skin:    General: Skin is warm and dry.  Neurological:     Mental Status: He is alert and oriented to person, place, and time. Mental status is at baseline.  Psychiatric:        Mood and Affect: Mood normal.        Behavior: Behavior normal.       No results found for any visits on 02/27/20.  Assessment & Plan     1. Type 1 diabetes mellitus with hyperglycemia (HCC) Counseled about fluctuations in sugar. Encouraged to follow up with endocrine. Patient is followed by Endocrine.Keep follow-up appointment with Dr. Honor Junes 03/27/2020. We will reach out to him later today and try to find  out name of nurse and home health so we can call her. Reports wife will call us.   2. HTN  Currently taking cardizem 300 mg daily.   No follow-ups on file.      ITrinna Post, PA-C, have reviewed all documentation for this visit. The documentation on 02/27/20 for the exam, diagnosis, procedures, and orders are all accurate and complete.  I have spent 15 minutes with this patient, >50% of which was spent on counseling and coordination of care.     Paulene Floor  [facility] [phone] (phone) [phone] (fax)  [place]

## 2020-02-29 ENCOUNTER — Telehealth: Payer: Self-pay

## 2020-02-29 ENCOUNTER — Telehealth: Payer: Self-pay | Admitting: Physician Assistant

## 2020-02-29 NOTE — Telephone Encounter (Signed)
Patient was advised and states that you told him to contact you if they do not get in touch with the company for the [employer]. Patient stated that he spoke with a nurse from Dr. Honor Junes office and she states that she could not get in touch with anyone from the company.

## 2020-02-29 NOTE — Telephone Encounter (Signed)
Craig [name], from [place], called stating that she is needing a glucometer for pt. She states that she is leaving her information so that PCP can get in contact with her. Please advise.    [phone] secure

## 2020-02-29 NOTE — Telephone Encounter (Signed)
Please Review

## 2020-02-29 NOTE — Telephone Encounter (Signed)
Copied from [place] [phone]. Topic: Medical Record Request - Other >> Feb 29, 2020 12:27 PM Alanda Slim E wrote: Patient Name/DOB/MRN #: Alejo Rayo / [birth year]/ [id number] Requestor Name/Agency: [employer]  Call Back #: [phone] Information Requested: addendum to [facility] notes for DOS 4.30.21/ paperwork faxed on 5.13.21/ please advise if received / please fax back to [phone]   Route to [facility] for [place] clinics. For all other clinics, route to the clinic's PEC Pool.

## 2020-02-29 NOTE — Telephone Encounter (Signed)
I've added the addendum

## 2020-02-29 NOTE — Telephone Encounter (Signed)
Copied from [place] [id number]. Topic: Medical Record Request - Other >> Feb 29, 2020 12:27 PM Alanda Slim E wrote: Patient Name/DOB/MRN #: Toben Letizia / March 19, 1946/ [id number] Requestor Name/Agency: [employer]  Call Back #: [phone] Information Requested: addendum to [place] notes for DOS 4.30.21/ paperwork faxed on 5.13.21/ please advise if received / please fax back to [phone]   Route to [facility] for [place] clinics. For all other clinics, route to the clinic's PEC Pool.

## 2020-02-29 NOTE — Telephone Encounter (Signed)
OK, we can let them know he is seeing Dr. Honor Junes in [facility] who has given him a sample [employer] and has given him instructions on how to call insurance company and get this ordered. Company was [employer] and instructions are in most recent endocrinology note. Patient said his home health nurse was calling but said they hadn't gotten in touch with the company. He didn't know the name or company of his home health nurse. His endocrinologist is managing his diabetes now.

## 2020-02-29 NOTE — Telephone Encounter (Signed)
Returned Craig [name] w/ [facility] to advise that patient has a glucometer order in place and no answer. Left voicemail for Craig [name] to return call.

## 2020-02-29 NOTE — Telephone Encounter (Signed)
Can we call [facility] and touch base on the status of his meter. This can wait until Monday.

## 2020-02-29 NOTE — Telephone Encounter (Signed)
We received a fax from [facility] on 02/11/2020 and it was faxed with OV note 02/08/2020. I called [facility] and spoke with Olivia Mackie. Olivia Mackie advised that they need times testing in the OV note from 02/08/2020. Olivia Mackie stated that if Fabio Bering can make an addendum to 02/08/2020 OV and note how many times a day pt should be testing his blood sugar via finger stick daily, then they can process this for pt. Please advise. Thanks TNP

## 2020-03-03 ENCOUNTER — Inpatient Hospital Stay (HOSPITAL_BASED_OUTPATIENT_CLINIC_OR_DEPARTMENT_OTHER): Payer: Medicare Other | Admitting: Oncology

## 2020-03-03 ENCOUNTER — Inpatient Hospital Stay: Payer: Medicare Other

## 2020-03-03 ENCOUNTER — Other Ambulatory Visit: Payer: Self-pay

## 2020-03-03 ENCOUNTER — Encounter: Payer: Self-pay | Admitting: Oncology

## 2020-03-03 ENCOUNTER — Telehealth: Payer: Self-pay

## 2020-03-03 VITALS — BP 142/73 | HR 71 | Temp 98.5°F | Resp 16 | Wt 170.2 lb

## 2020-03-03 DIAGNOSIS — D6481 Anemia due to antineoplastic chemotherapy: Secondary | ICD-10-CM | POA: Diagnosis not present

## 2020-03-03 DIAGNOSIS — Z5111 Encounter for antineoplastic chemotherapy: Secondary | ICD-10-CM | POA: Diagnosis not present

## 2020-03-03 DIAGNOSIS — T451X5A Adverse effect of antineoplastic and immunosuppressive drugs, initial encounter: Secondary | ICD-10-CM

## 2020-03-03 DIAGNOSIS — C787 Secondary malignant neoplasm of liver and intrahepatic bile duct: Secondary | ICD-10-CM | POA: Diagnosis not present

## 2020-03-03 DIAGNOSIS — C259 Malignant neoplasm of pancreas, unspecified: Secondary | ICD-10-CM | POA: Diagnosis not present

## 2020-03-03 LAB — COMPREHENSIVE METABOLIC PANEL
ALT: 25 U/L (ref 0–44)
AST: 21 U/L (ref 15–41)
Albumin: 3.4 g/dL — ABNORMAL LOW (ref 3.5–5.0)
Alkaline Phosphatase: 73 U/L (ref 38–126)
Anion gap: 8 (ref 5–15)
BUN: 23 mg/dL (ref 8–23)
CO2: 24 mmol/L (ref 22–32)
Calcium: 8.4 mg/dL — ABNORMAL LOW (ref 8.9–10.3)
Chloride: 109 mmol/L (ref 98–111)
Creatinine, Ser: 0.85 mg/dL (ref 0.61–1.24)
GFR calc Af Amer: >60 mL/min (ref >60)
GFR calc non Af Amer: >60 mL/min (ref >60)
Glucose, Bld: 121 mg/dL — ABNORMAL HIGH (ref 70–99)
Potassium: 4 mmol/L (ref 3.5–5.1)
Sodium: 141 mmol/L (ref 135–145)
Total Bilirubin: 0.6 mg/dL (ref 0.3–1.2)
Total Protein: 6.3 g/dL — ABNORMAL LOW (ref 6.5–8.1)

## 2020-03-03 LAB — CBC WITH DIFFERENTIAL/PLATELET
Abs Immature Granulocytes: 0.03 10*3/uL (ref 0.00–0.07)
Basophils Absolute: 0.1 10*3/uL (ref 0.0–0.1)
Basophils Relative: 1 %
Eosinophils Absolute: 0.2 10*3/uL (ref 0.0–0.5)
Eosinophils Relative: 4 %
HCT: 31.7 % — ABNORMAL LOW (ref 39.0–52.0)
Hemoglobin: 9.9 g/dL — ABNORMAL LOW (ref 13.0–17.0)
Immature Granulocytes: 1 %
Lymphocytes Relative: 17 %
Lymphs Abs: 0.8 10*3/uL (ref 0.7–4.0)
MCH: 28.9 pg (ref 26.0–34.0)
MCHC: 31.2 g/dL (ref 30.0–36.0)
MCV: 92.4 fL (ref 80.0–100.0)
Monocytes Absolute: 0.6 10*3/uL (ref 0.1–1.0)
Monocytes Relative: 12 %
Neutro Abs: 3.1 10*3/uL (ref 1.7–7.7)
Neutrophils Relative %: 65 %
Platelets: 185 10*3/uL (ref 150–400)
RBC: 3.43 MIL/uL — ABNORMAL LOW (ref 4.22–5.81)
RDW: 17 % — ABNORMAL HIGH (ref 11.5–15.5)
WBC: 4.8 10*3/uL (ref 4.0–10.5)
nRBC: 0 % (ref 0.0–0.2)

## 2020-03-03 MED ORDER — PALONOSETRON HCL INJECTION 0.25 MG/5ML
0.2500 mg | Freq: Once | INTRAVENOUS | Status: AC
  Administered 2020-03-03: 0.25 mg via INTRAVENOUS
  Filled 2020-03-03: qty 5

## 2020-03-03 MED ORDER — SODIUM CHLORIDE 0.9 % IV SOLN
2400.0000 mg/m2 | INTRAVENOUS | Status: DC
  Administered 2020-03-03: 4550 mg via INTRAVENOUS
  Filled 2020-03-03: qty 91

## 2020-03-03 MED ORDER — SODIUM CHLORIDE 0.9 % IV SOLN
67.8000 mg/m2 | Freq: Once | INTRAVENOUS | Status: AC
  Administered 2020-03-03: 129 mg via INTRAVENOUS
  Filled 2020-03-03: qty 30

## 2020-03-03 MED ORDER — SODIUM CHLORIDE 0.9 % IV SOLN
421.0000 mg/m2 | Freq: Once | INTRAVENOUS | Status: AC
  Administered 2020-03-03: 800 mg via INTRAVENOUS
  Filled 2020-03-03: qty 25

## 2020-03-03 MED ORDER — SODIUM CHLORIDE 0.9% FLUSH
10.0000 mL | Freq: Once | INTRAVENOUS | Status: AC
  Administered 2020-03-03: 10 mL via INTRAVENOUS
  Filled 2020-03-03: qty 10

## 2020-03-03 MED ORDER — SODIUM CHLORIDE 0.9 % IV SOLN
10.0000 mg | Freq: Once | INTRAVENOUS | Status: AC
  Administered 2020-03-03: 10 mg via INTRAVENOUS
  Filled 2020-03-03: qty 10

## 2020-03-03 MED ORDER — SODIUM CHLORIDE 0.9 % IV SOLN
Freq: Once | INTRAVENOUS | Status: AC
  Filled 2020-03-03: qty 250

## 2020-03-03 NOTE — Telephone Encounter (Signed)
Faxed Addendum OV note 02/08/2020 to [facility] supplies. TNP

## 2020-03-03 NOTE — Progress Notes (Signed)
Patient here for oncology follow-up appointment, expresses no complaints or concerns at this time.    

## 2020-03-03 NOTE — Progress Notes (Signed)
Hematology/Oncology  Follow up note [facility] [phone] [phone]) [phone]   Patient Care Team: Paulene Floor as PCP - General (Physician Assistant) Clent Jacks, RN as Oncology Nurse Navigator Earlie Server, MD as Consulting Physician (Hematology and Oncology) Neldon Labella, RN as Case Manager Lonia Farber, MD as Consulting Physician (Internal Medicine)  REFERRING PROVIDER: Trinna Post, PA-C  CHIEF COMPLAINTS/REASON FOR VISIT:  Follow up for treatment of pancreatic cancer  HISTORY OF PRESENTING ILLNESS:   Craig [name] is a  74 y.o.  male with PMH listed below, including CABG x5, PVD, hypertension, former tobacco abuse, former alcohol abuse, iron deficiency anemia, was seen in consultation at the request of  Terrilee Croak, Adriana M, PA-C  for evaluation of abnormal CT Patient recently presented to primary care provider complaining for abdominal pain radiating to his back for about 10 days.  Patient uses Tylenol as needed for pain. Work-up showed elevated amylase, lipase, mild anemia with hemoglobin of 11.3 Acute hepatitis panel negative. 05/16/2019 CT abdomen pelvis with contrast showed poorly marginated heterogeneous hypodense 2.9 cm pancreatic mass at the uncinate process, worrisome for primary pancreatic cancer.  No biliary or pancreatic duct dilation. Several ill defined hypodense liver masses scattered throughout the liver, largest 3.1 cm in the segment 6 right liver lobe. Nonspecific portacaval and aortocaval adenopathy. Extreme medial left lung base 4.4 cm pleural-based mass probably a pleural metastasis.  Additional tiny pulmonary nodules scattered at the right lung base are indeterminate. Mild prostate or megaly  He had a history of 37.5-pack-year smoking history quitting 2018. No longer drinking alcohol.  # ultrasound-guided liver biopsy on 05/21/2019.  Pathology showed fragments of benign hepatic parenchyma showing  minimal macro vascular steatosis, otherwise no significant histo pathologic change. Single core of renal tissue showing approximately 25% glomerulosclerosis  # #History of CAD, status post CABG x5.  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  # Omniseq test showed PD-L1 50% TPS, TMB high, negative for BRCA1/2 or NTRK fusion.  MSI could not be completed.  Patient declines genetic testing  #  third line chemotherapy treatment 5-FU with liposomal irinotecan 01/24/2020-01/26/2020 patient was admitted to ICU due to DKA. on long-acting insulin 20 units at night and takes sliding scale with meals.  INTERVAL HISTORY Craig [name] is a 74 y.o. male who has above history reviewed by me today presents for evaluation of chemotherapy tolerability for metastatic pancreatic cancer.   Problems and complaints are listed below: Patient is on third line chemotherapy treatments.  Today he has no new complaints except intermittent lower extremity cramps. Denies any pain, nausea, vomiting or diarrhea .[name] Kitchen Review of Systems  Constitutional: Positive for fatigue. Negative for appetite change, chills, fever and unexpected weight change.  HENT:   Negative for hearing loss and voice change.   Eyes: Negative for eye problems and icterus.  Respiratory: Negative for chest tightness, cough and shortness of breath.   Cardiovascular: Negative for chest pain and leg swelling.  Gastrointestinal: Negative for abdominal distention and abdominal pain.  Endocrine: Negative for hot flashes.  Genitourinary: Negative for difficulty urinating, dysuria and frequency.   Musculoskeletal: Negative for arthralgias.       Leg cramps  Skin: Negative for itching and rash.  Neurological: Positive for numbness. Negative for light-headedness.  Hematological: Negative for adenopathy. Does not bruise/bleed easily.   Psychiatric/Behavioral: Negative for confusion.    MEDICAL  HISTORY:  Past Medical History:  Diagnosis Date  . Allergic rhinitis   . Cancer [facility])    new dx Pancreatic Cancer - Aug 2020  . CHF (congestive heart failure) ([place])   . Chronic cholecystitis   . Coronary artery disease   . DDD (degenerative disc disease), cervical   . Dehydration 06/21/2019  . Diabetes mellitus without complication ([place])   . Dyspnea   . Early cataract   . Erectile dysfunction   . GERD (gastroesophageal reflux disease)   . Gouty arthritis   . Headache    occasional migraines  . Hypercalcemia   . Hyperlipemia   . Hypertension   . Palpitations   . Peripheral vascular disease ([place])   . 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   LIMA to LAD SVG to [place] SVG SEQUENTIALLY to OM1 and OM2 SVG to ACUTE MARGINAL  . Spinal stenosis of cervical region   . Vitamin D deficiency     SURGICAL HISTORY: Past Surgical History:  Procedure Laterality Date  . BACK SURGERY  2018    fusion with screws  . CHOLECYSTECTOMY N/A 11/13/2018   Procedure: LAPAROSCOPIC CHOLECYSTECTOMY;  Surgeon: Vickie Epley, MD;  Location: [facility] ORS;  Service: General;  Laterality: N/A;  . COLONOSCOPY    . CORONARY ARTERY BYPASS GRAFT N/A 09/30/2017   Procedure: CORONARY ARTERY BYPASS GRAFTING times five using right and left Saphaneous vein harvested endoscopicly  and left internal mammary artery. (CABG),TEE;  Surgeon: Rexene Alberts, MD;  Location: [place];  Service: Open Heart Surgery;  Laterality: N/A;  . LEFT HEART CATH AND CORONARY ANGIOGRAPHY Left 09/06/2017   Procedure: LEFT HEART CATH AND CORONARY ANGIOGRAPHY;  Surgeon: Corey Skains, MD;  Location: [facility];  Service: Cardiovascular;  Laterality: Left;  . percutaneous transluminal balloon angioplasty  01/2010   of left lower extremity  . PORTA CATH INSERTION N/A 06/06/2019   Procedure: PORTA CATH INSERTION;  Surgeon: Katha Cabal, MD;  Location: [place] [facility]  [facility];  Service: Cardiovascular;  Laterality: N/A;  . TEE WITHOUT CARDIOVERSION N/A 09/30/2017   Procedure: TRANSESOPHAGEAL ECHOCARDIOGRAM (TEE);  Surgeon: Rexene Alberts, MD;  Location: [place];  Service: Open Heart Surgery;  Laterality: N/A;  . VASCULAR SURGERY Left 2010   left exernal iliac and superficial femoral artery PTCA and stenting    SOCIAL HISTORY: Social History   Socioeconomic History  . Marital status: Married    Spouse name: Enid Derry  . Number of children: 2  . Years of education: Not on file  . Highest education level: 5th grade  Occupational History  . Occupation: drove tractors    Comment: retired  Tobacco Use  . Smoking status: Former Smoker    Packs/day: 0.75    Years: 50.00    Pack years: 37.50    Types: Cigarettes    Quit date: 09/27/2017    Years since quitting: 2.4  . Smokeless tobacco: Former Systems developer    Types: Chew  Substance and Sexual Activity  . Alcohol use: Not Currently    Comment: stopped drinking before cabg  . Drug use: Not Currently    Types: Marijuana    Comment: stopped smoking before CABG  . Sexual activity: Not Currently  Other Topics Concern  . Not on file  Social History Narrative  . Not on file   Social Determinants of Health   Financial Resource Strain: Low Risk   . Difficulty of Paying Living Expenses: Not hard at all  Food Insecurity:  No Food Insecurity  . Worried About Charity fundraiser in the Last Year: Never true  . Ran Out of Food in the Last Year: Never true  Transportation Needs: No Transportation Needs  . Lack of Transportation (Medical): No  . Lack of Transportation (Non-Medical): No  Physical Activity: Inactive  . Days of Exercise per Week: 0 days  . Minutes of Exercise per Session: 0 min  Stress: No Stress Concern Present  . Feeling of Stress : Not at all  Social Connections: Somewhat Isolated  . Frequency of Communication with Friends and Family: More than three times a week  . Frequency of Social Gatherings  with Friends and Family: More than three times a week  . Attends Religious Services: Never  . Active Member of Clubs or Organizations: No  . Attends Archivist Meetings: Never  . Marital Status: Married  Human resources officer Violence: Not At Risk  . Fear of Current or Ex-Partner: No  . Emotionally Abused: No  . Physically Abused: No  . Sexually Abused: No    FAMILY HISTORY: Family History  Problem Relation Age of Onset  . Brain cancer Mother   . Emphysema Father   . Cancer Brother     ALLERGIES:  is allergic to lipitor [atorvastatin]; zetia [ezetimibe]; lisinopril; protonix [pantoprazole]; and spironolactone.  MEDICATIONS:  Current Outpatient Medications  Medication Sig Dispense Refill  . aspirin EC 81 MG tablet Take 81 mg by mouth daily.    . carvedilol (COREG) 6.25 MG tablet Take 6.25 mg by mouth 2 (two) times daily with a meal.  3  . Continuous Blood Gluc Receiver (FREESTYLE LIBRE 14 DAY READER) DEVI 1 Device by Does not apply route every 14 (fourteen) days. 6 each 1  . diltiazem (CARDIZEM CD) 300 MG 24 hr capsule TAKE 1 CAPSULE BY MOUTH EVERY DAY 90 capsule 1  . dorzolamide-timolol (COSOPT) 22.3-6.8 MG/ML ophthalmic solution INSTILL ONE DROP INTO BOTH EYES TWICE DAILY    . ferrous sulfate 325 (65 FE) MG tablet TAKE 1 TABLET BY MOUTH EVERY DAY WITH BREAKFAST 90 tablet 1  . gabapentin (NEURONTIN) 300 MG capsule TAKE 2 CAPSULES (600 MG TOTAL) BY MOUTH 3 (THREE) TIMES DAILY. 540 capsule 0  . ibuprofen (ADVIL) 800 MG tablet Take 1 tablet (800 mg total) by mouth every 8 (eight) hours as needed. 30 tablet 0  . insulin glargine (LANTUS SOLOSTAR) 100 UNIT/ML Solostar Pen Inject 20 Units into the skin at bedtime. 15 mL 0  . insulin lispro (HUMALOG) 100 UNIT/ML KwikPen Inject 0.05 mLs (5 Units total) into the skin 3 (three) times daily with meals. 15 mL 11  . Insulin Pen Needle (PEN NEEDLES) 32G X 5 MM MISC Use four times daily for insulin 400 each 1  . latanoprost (XALATAN) 0.005  % ophthalmic solution Place 1 drop into both eyes at bedtime.   3  . lidocaine-prilocaine (EMLA) cream APPLY TO AFFECTED AREA ONCE AS DIRECTED    . loperamide (IMODIUM A-D) 2 MG tablet Take 1 tablet (2 mg total) by mouth See admin instructions. Take 2 at diarrhea onset , then 1 every 2hr until 12hrs with no BM. May take 2 every 4hrs at night. If diarrhea recurs repeat. 100 tablet 1  . Multiple Vitamin (MULTIVITAMIN WITH MINERALS) TABS tablet Take 1 tablet by mouth daily.    . OMEGA-3 FATTY ACIDS PO Take 1,200 mg by mouth daily.     [name] Kitchen omeprazole (PRILOSEC) 40 MG capsule TAKE 1 CAPSULE BY MOUTH EVERY  DAY 90 capsule 1  . ONETOUCH VERIO test strip TEST FASTING SUGAR EVERY MORNING 100 strip 3  . oxyCODONE (OXY IR/ROXICODONE) 5 MG immediate release tablet Take 1 tablet (5 mg total) by mouth every 6 (six) hours as needed for severe pain. (Patient not taking: Reported on 02/18/2020) 30 tablet 0  . potassium chloride (KLOR-CON) 10 MEQ tablet Take 1 tablet (10 mEq total) by mouth daily. (Patient not taking: Reported on 02/27/2020) 30 tablet 0   No current facility-administered medications for this visit.   Facility-Administered Medications Ordered in Other Visits  Medication Dose Route Frequency Provider Last Rate Last Admin  . sodium chloride flush (NS) 0.9 % injection 10 mL  10 mL Intracatheter PRN Earlie Server, MD      . sodium chloride flush (NS) 0.9 % injection 10 mL  10 mL Intravenous Once Earlie Server, MD      . sodium chloride flush (NS) 0.9 % injection 10 mL  10 mL Intravenous Once Earlie Server, MD         PHYSICAL EXAMINATION: ECOG PERFORMANCE STATUS: 1 - Symptomatic but completely ambulatory Vitals:   03/03/20 0851  BP: (!) 142/73  Pulse: 71  Resp: 16  Temp: 98.5 F (36.9 C)  SpO2: 100%   Filed Weights   03/03/20 0851  Weight: 170 lb 3.2 oz (77.2 kg)    Physical Exam Constitutional:      General: He is not in acute distress. HENT:     Head: Normocephalic and atraumatic.  Eyes:      General: No scleral icterus. Cardiovascular:     Rate and Rhythm: Normal rate and regular rhythm.     Heart sounds: Normal heart sounds.  Pulmonary:     Effort: Pulmonary effort is normal. No respiratory distress.     Breath sounds: No wheezing.  Abdominal:     General: Bowel sounds are normal. There is no distension.     Palpations: Abdomen is soft.  Musculoskeletal:        General: No deformity. Normal range of motion.     Cervical back: Normal range of motion and neck supple.  Skin:    General: Skin is warm and dry.     Findings: No erythema or rash.  Neurological:     Mental Status: He is alert and oriented to person, place, and time. Mental status is at baseline.     Cranial Nerves: No cranial nerve deficit.     Coordination: Coordination normal.  Psychiatric:        Mood and Affect: Mood normal.     LABORATORY DATA:  I have reviewed the data as listed Lab Results  Component Value Date   WBC 4.9 02/18/2020   HGB 10.3 (L) 02/18/2020   HCT 32.4 (L) 02/18/2020   MCV 91.3 02/18/2020   PLT 236 02/18/2020   Recent Labs    02/04/20 0837 02/11/20 1030 02/18/20 0820  NA 141 140 140  K 3.1* 4.3 3.4*  CL 106 108 106  CO2 _0 GLUCOSE 133* 79 161*  BUN _1 CREATININE 1.01 0.99 0.90  CALCIUM 8.5* 8.4* 8.4*  GFRNONAA >60 >60 >60  GFRAA >60 >60 >60  PROT 6.4* 6.2* 6.4*  ALBUMIN 3.2* 3.4* 3.3*  AST 35 23 28  ALT 63* 38 36  ALKPHOS 81 79 86  BILITOT 0.8 0.7 0.7   Iron/TIBC/Ferritin/ %Sat    Component Value Date/Time   IRON 119 04/27/2019 0940   TIBC 363 04/27/2019  0940   FERRITIN 58 04/27/2019 0940   IRONPCTSAT 33 04/27/2019 0940      RADIOGRAPHIC STUDIES: I have personally reviewed the radiological images as listed and agreed with the findings in the report. CT CHEST W CONTRAST  Result Date: 01/02/2020 CLINICAL DATA:  74 year old male with history of pancreatic cancer with liver metastasis. Elevated PSA. EXAM: CT CHEST, ABDOMEN, AND PELVIS WITH  CONTRAST TECHNIQUE: Multidetector CT imaging of the chest, abdomen and pelvis was performed following the standard protocol during bolus administration of intravenous contrast. CONTRAST:  132m OMNIPAQUE IOHEXOL 300 MG/ML  SOLN COMPARISON:  CT of the chest abdomen pelvis dated 11/05/2019. FINDINGS: CT CHEST FINDINGS Cardiovascular: There is no cardiomegaly or pericardial effusion. Three-vessel coronary vascular calcification and postsurgical changes of CABG. There is advanced atherosclerotic calcification of the thoracic aorta. No aneurysmal dilatation or dissection. The origins of the great vessels of the aortic arch appear patent as visualized. The central pulmonary arteries appear patent for the degree of opacification. Mediastinum/Nodes: No hilar or mediastinal adenopathy. The esophagus is grossly unremarkable. Several subcentimeter hypodense thyroid nodules. No followup recommended (Ref: J Am Coll Radiol. 2015 Feb;12(2): 143-50). No mediastinal fluid collection. Lungs/Pleura: There is a 4.2 x 3.0 cm ovoid soft tissue mass involving the medial left lung base similar to prior CT. 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 Several small subpleural nodules primarily in the right lung. A 3 mm right upper lobe nodule (series 5, image 64) similar or minimally increased in size. There is a 5 mm subpleural nodule in the right lower lobe (series 5, image 99) new since the prior CT. Additional 5 mm right lung base subpleural nodule (series 5, image 14) also appears new since the prior CT. There is an 8 mm nodule at the left lung base (series 5, image 111) which has significantly increased in size since the prior CT. A 4 mm left lower lobe nodule (series 5, image 103) is new since the prior CT. These nodules are concerning for metastatic disease. There is no focal consolidation, pleural effusion, pneumothorax. The central airways are patent. Musculoskeletal: Median sternotomy wires.  Right pectoral Port-A-Cath with tip at the cavoatrial junction. There is osteopenia with degenerative changes of the spine. No acute osseous pathology. No definite suspicious osseous lesions. CT ABDOMEN PELVIS FINDINGS No intra-abdominal free air or free fluid. Hepatobiliary: There is a background of fatty infiltration of the liver. Interval increase in the size and number of hepatic hypodense lesions consistent with progression of metastatic disease. The largest lesion measures approximately 3.4 x 2.8 cm in the right lobe of the liver (previously 2.0 x 1.1 cm). No intrahepatic biliary ductal dilatation. Cholecystectomy. Pancreas: There is a 4.0 x 3.0 cm (previously 3.5 x 2.8 cm) soft tissue lesion in the region of the uncinate process of the pancreas (series 3, image 64). There is mild diffuse pancreatic atrophy and dilatation of the main pancreatic duct measuring up to 5 mm in diameter and progressed since the prior CT. There is mild haziness of the peripancreatic fat, likely reactive. Spleen: Normal in size without focal abnormality. Adrenals/Urinary Tract: The adrenal glands are unremarkable. There is no hydronephrosis on either side. Bilateral renal cysts measuring up to 4.3 cm in the inferior pole of the right kidney. There is symmetric enhancement and excretion of contrast by both kidneys. The visualized ureters and urinary bladder appear unremarkable. Stomach/Bowel: There is moderate stool throughout the colon. No bowel obstruction or active inflammation. The appendix  is normal. Vascular/Lymphatic: Advanced aortoiliac atherosclerotic disease. The IVC is grossly unremarkable. Probable mild narrowing of the porta splenic confluence secondary to pancreatic head mass. The SMV, splenic vein, and main portal vein are patent. No portal venous gas. Mildly enlarged pancreatico caval lymph node measures 15 mm (series 3, image 59). Reproductive: Mildly enlarged prostate gland. The seminal vesicles are symmetric. Other:  None Musculoskeletal: Osteopenia with degenerative changes of the spine. L4-L5 posterior fusion and disc spacer. No acute osseous pathology. IMPRESSION: 1. Overall progression of hepatic and pulmonary metastatic disease since the prior CT of 11/05/2019. 2. Interval increase in the size of the pancreatic head mass and pancreatic duct dilatation. 3. A 15 mm peripancreatic lymph node, increased in size since the prior CT. 4. Ovoid soft tissue mass in the medial left lung base, similar to prior CT. 5. 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 6. No bowel obstruction. Normal appendix. 7. Aortic Atherosclerosis (ICD10-I70.0). Electronically Signed   By: Anner Crete M.D.   On: 01/02/2020 18:49   CT ABDOMEN PELVIS W CONTRAST  Result Date: 01/02/2020 CLINICAL DATA:  74 year old male with history of pancreatic cancer with liver metastasis. Elevated PSA. EXAM: CT CHEST, ABDOMEN, AND PELVIS WITH CONTRAST TECHNIQUE: Multidetector CT imaging of the chest, abdomen and pelvis was performed following the standard protocol during bolus administration of intravenous contrast. CONTRAST:  182m OMNIPAQUE IOHEXOL 300 MG/ML  SOLN COMPARISON:  CT of the chest abdomen pelvis dated 11/05/2019. FINDINGS: CT CHEST FINDINGS Cardiovascular: There is no cardiomegaly or pericardial effusion. Three-vessel coronary vascular calcification and postsurgical changes of CABG. There is advanced atherosclerotic calcification of the thoracic aorta. No aneurysmal dilatation or dissection. The origins of the great vessels of the aortic arch appear patent as visualized. The central pulmonary arteries appear patent for the degree of opacification. Mediastinum/Nodes: No hilar or mediastinal adenopathy. The esophagus is grossly unremarkable. Several subcentimeter hypodense thyroid nodules. No followup recommended (Ref: J Am Coll Radiol. 2015 Feb;12(2): 143-50). No mediastinal fluid collection. Lungs/Pleura: There is a 4.2 x 3.0 cm  ovoid soft tissue mass involving the medial left lung base similar to prior CT. 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 Several small subpleural nodules primarily in the right lung. A 3 mm right upper lobe nodule (series 5, image 64) similar or minimally increased in size. There is a 5 mm subpleural nodule in the right lower lobe (series 5, image 99) new since the prior CT. Additional 5 mm right lung base subpleural nodule (series 5, image 14) also appears new since the prior CT. There is an 8 mm nodule at the left lung base (series 5, image 111) which has significantly increased in size since the prior CT. A 4 mm left lower lobe nodule (series 5, image 103) is new since the prior CT. These nodules are concerning for metastatic disease. There is no focal consolidation, pleural effusion, pneumothorax. The central airways are patent. Musculoskeletal: Median sternotomy wires. Right pectoral Port-A-Cath with tip at the cavoatrial junction. There is osteopenia with degenerative changes of the spine. No acute osseous pathology. No definite suspicious osseous lesions. CT ABDOMEN PELVIS FINDINGS No intra-abdominal free air or free fluid. Hepatobiliary: There is a background of fatty infiltration of the liver. Interval increase in the size and number of hepatic hypodense lesions consistent with progression of metastatic disease. The largest lesion measures approximately 3.4 x 2.8 cm in the right lobe of the liver (previously 2.0 x 1.1 cm). No intrahepatic biliary  ductal dilatation. Cholecystectomy. Pancreas: There is a 4.0 x 3.0 cm (previously 3.5 x 2.8 cm) soft tissue lesion in the region of the uncinate process of the pancreas (series 3, image 64). There is mild diffuse pancreatic atrophy and dilatation of the main pancreatic duct measuring up to 5 mm in diameter and progressed since the prior CT. There is mild haziness of the peripancreatic fat, likely reactive. Spleen: Normal in  size without focal abnormality. Adrenals/Urinary Tract: The adrenal glands are unremarkable. There is no hydronephrosis on either side. Bilateral renal cysts measuring up to 4.3 cm in the inferior pole of the right kidney. There is symmetric enhancement and excretion of contrast by both kidneys. The visualized ureters and urinary bladder appear unremarkable. Stomach/Bowel: There is moderate stool throughout the colon. No bowel obstruction or active inflammation. The appendix is normal. Vascular/Lymphatic: Advanced aortoiliac atherosclerotic disease. The IVC is grossly unremarkable. Probable mild narrowing of the porta splenic confluence secondary to pancreatic head mass. The SMV, splenic vein, and main portal vein are patent. No portal venous gas. Mildly enlarged pancreatico caval lymph node measures 15 mm (series 3, image 59). Reproductive: Mildly enlarged prostate gland. The seminal vesicles are symmetric. Other: None Musculoskeletal: Osteopenia with degenerative changes of the spine. L4-L5 posterior fusion and disc spacer. No acute osseous pathology. IMPRESSION: 1. Overall progression of hepatic and pulmonary metastatic disease since the prior CT of 11/05/2019. 2. Interval increase in the size of the pancreatic head mass and pancreatic duct dilatation. 3. A 15 mm peripancreatic lymph node, increased in size since the prior CT. 4. Ovoid soft tissue mass in the medial left lung base, similar to prior CT. 5. 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 6. No bowel obstruction. Normal appendix. 7. Aortic Atherosclerosis (ICD10-I70.0). Electronically Signed   By: Anner Crete M.D.   On: 01/02/2020 18:49      ASSESSMENT & PLAN:  1. Pancreatic cancer metastasized to liver ([place])   2. Encounter for antineoplastic chemotherapy   3. Anemia due to antineoplastic chemotherapy     #Metastatic pancreatic cancer- liver mets, hilar nodal metastasis, ?bone mets, CA 19-9 trending down to [id number].   Today's CA 19-9 is pending. Labs reviewed and discussed with patient.  Counts acceptable to proceed with 5-FU/liposomal irinotecan.  #Chronic hypokalemia, K has improved to 4.  Continue potassium chloride 10 mEq daily.  #  diabetes/ hyperglycemia, hyperglycemia is better controlled now on insulin regimen Follows up with primary care provider for management.  #Normocytic anemia, likely secondary to chemo.  Hemoglobin has decreased to 9.9.  Continue to monitor.  #All questions were answered. The patient knows to call the clinic with any problems questions or concerns. Follow-up  2 weeks.   Earlie Server, MD, PhD Hematology Oncology [place] [facility] 03/03/2020

## 2020-03-03 NOTE — Telephone Encounter (Signed)
Copied from [place] [phone]. Topic: Medical Record Request - Other >> Feb 29, 2020 12:27 PM Alanda Slim E wrote: Patient Name/DOB/MRN #: Craig [name] / 05-Jun-1946/ [id number] Requestor Name/Agency: [employer]  Call Back #: [phone] Information Requested: addendum to [place] notes for DOS 4.30.21/ paperwork faxed on 5.13.21/ please advise if received / please fax back to [phone]   Route to [facility] for [place] clinics. For all other clinics, route to the clinic's PEC Pool.

## 2020-03-04 ENCOUNTER — Ambulatory Visit: Payer: Self-pay

## 2020-03-04 DIAGNOSIS — E119 Type 2 diabetes mellitus without complications: Secondary | ICD-10-CM

## 2020-03-04 DIAGNOSIS — E1165 Type 2 diabetes mellitus with hyperglycemia: Secondary | ICD-10-CM | POA: Diagnosis not present

## 2020-03-04 LAB — CANCER ANTIGEN 19-9: CA 19-9: 10333 U/mL — ABNORMAL HIGH (ref 0–35)

## 2020-03-04 NOTE — Telephone Encounter (Signed)
Order is been worked on by medical records.

## 2020-03-05 ENCOUNTER — Inpatient Hospital Stay: Payer: Medicare Other

## 2020-03-05 ENCOUNTER — Other Ambulatory Visit: Payer: Self-pay

## 2020-03-05 DIAGNOSIS — C259 Malignant neoplasm of pancreas, unspecified: Secondary | ICD-10-CM

## 2020-03-05 DIAGNOSIS — Z5111 Encounter for antineoplastic chemotherapy: Secondary | ICD-10-CM | POA: Diagnosis not present

## 2020-03-05 MED ORDER — SODIUM CHLORIDE 0.9% FLUSH
10.0000 mL | INTRAVENOUS | Status: DC | PRN
  Administered 2020-03-05: 10 mL
  Filled 2020-03-05: qty 10

## 2020-03-05 MED ORDER — HEPARIN SOD (PORK) LOCK FLUSH 100 UNIT/ML IV SOLN
INTRAVENOUS | Status: AC
  Filled 2020-03-05: qty 5

## 2020-03-05 MED ORDER — HEPARIN SOD (PORK) LOCK FLUSH 100 UNIT/ML IV SOLN
500.0000 [IU] | Freq: Once | INTRAVENOUS | Status: AC | PRN
  Administered 2020-03-05: 500 [IU]
  Filled 2020-03-05: qty 5

## 2020-03-12 ENCOUNTER — Other Ambulatory Visit: Payer: Self-pay | Admitting: Oncology

## 2020-03-12 NOTE — Telephone Encounter (Signed)
...    Ref Range & Units 9 d ago 3 wk ago 1 mo ago  Potassium 3.5 - 5.1 mmol/L 4.0  3.4Low   4.3

## 2020-03-13 ENCOUNTER — Inpatient Hospital Stay: Payer: Medicare Other | Attending: Oncology

## 2020-03-13 DIAGNOSIS — R599 Enlarged lymph nodes, unspecified: Secondary | ICD-10-CM | POA: Insufficient documentation

## 2020-03-13 DIAGNOSIS — Z87891 Personal history of nicotine dependence: Secondary | ICD-10-CM | POA: Insufficient documentation

## 2020-03-13 DIAGNOSIS — Z808 Family history of malignant neoplasm of other organs or systems: Secondary | ICD-10-CM | POA: Insufficient documentation

## 2020-03-13 DIAGNOSIS — Z888 Allergy status to other drugs, medicaments and biological substances status: Secondary | ICD-10-CM | POA: Insufficient documentation

## 2020-03-13 DIAGNOSIS — T451X5A Adverse effect of antineoplastic and immunosuppressive drugs, initial encounter: Secondary | ICD-10-CM | POA: Insufficient documentation

## 2020-03-13 DIAGNOSIS — E1151 Type 2 diabetes mellitus with diabetic peripheral angiopathy without gangrene: Secondary | ICD-10-CM | POA: Insufficient documentation

## 2020-03-13 DIAGNOSIS — I739 Peripheral vascular disease, unspecified: Secondary | ICD-10-CM | POA: Insufficient documentation

## 2020-03-13 DIAGNOSIS — R200 Anesthesia of skin: Secondary | ICD-10-CM | POA: Insufficient documentation

## 2020-03-13 DIAGNOSIS — E042 Nontoxic multinodular goiter: Secondary | ICD-10-CM | POA: Insufficient documentation

## 2020-03-13 DIAGNOSIS — E876 Hypokalemia: Secondary | ICD-10-CM | POA: Insufficient documentation

## 2020-03-13 DIAGNOSIS — K219 Gastro-esophageal reflux disease without esophagitis: Secondary | ICD-10-CM | POA: Insufficient documentation

## 2020-03-13 DIAGNOSIS — C7951 Secondary malignant neoplasm of bone: Secondary | ICD-10-CM | POA: Insufficient documentation

## 2020-03-13 DIAGNOSIS — I2510 Atherosclerotic heart disease of native coronary artery without angina pectoris: Secondary | ICD-10-CM | POA: Insufficient documentation

## 2020-03-13 DIAGNOSIS — Z5111 Encounter for antineoplastic chemotherapy: Secondary | ICD-10-CM | POA: Insufficient documentation

## 2020-03-13 DIAGNOSIS — C787 Secondary malignant neoplasm of liver and intrahepatic bile duct: Secondary | ICD-10-CM | POA: Insufficient documentation

## 2020-03-13 DIAGNOSIS — M8580 Other specified disorders of bone density and structure, unspecified site: Secondary | ICD-10-CM | POA: Insufficient documentation

## 2020-03-13 DIAGNOSIS — K760 Fatty (change of) liver, not elsewhere classified: Secondary | ICD-10-CM | POA: Insufficient documentation

## 2020-03-13 DIAGNOSIS — Z794 Long term (current) use of insulin: Secondary | ICD-10-CM | POA: Insufficient documentation

## 2020-03-13 DIAGNOSIS — I700 Atherosclerosis of aorta: Secondary | ICD-10-CM | POA: Insufficient documentation

## 2020-03-13 DIAGNOSIS — R5383 Other fatigue: Secondary | ICD-10-CM | POA: Insufficient documentation

## 2020-03-13 DIAGNOSIS — R5382 Chronic fatigue, unspecified: Secondary | ICD-10-CM | POA: Insufficient documentation

## 2020-03-13 DIAGNOSIS — N400 Enlarged prostate without lower urinary tract symptoms: Secondary | ICD-10-CM | POA: Insufficient documentation

## 2020-03-13 DIAGNOSIS — D6481 Anemia due to antineoplastic chemotherapy: Secondary | ICD-10-CM | POA: Insufficient documentation

## 2020-03-13 DIAGNOSIS — C7800 Secondary malignant neoplasm of unspecified lung: Secondary | ICD-10-CM | POA: Insufficient documentation

## 2020-03-13 DIAGNOSIS — Z79899 Other long term (current) drug therapy: Secondary | ICD-10-CM | POA: Insufficient documentation

## 2020-03-13 DIAGNOSIS — C259 Malignant neoplasm of pancreas, unspecified: Secondary | ICD-10-CM | POA: Insufficient documentation

## 2020-03-13 DIAGNOSIS — Z836 Family history of other diseases of the respiratory system: Secondary | ICD-10-CM | POA: Insufficient documentation

## 2020-03-13 DIAGNOSIS — R109 Unspecified abdominal pain: Secondary | ICD-10-CM | POA: Insufficient documentation

## 2020-03-13 DIAGNOSIS — N281 Cyst of kidney, acquired: Secondary | ICD-10-CM | POA: Insufficient documentation

## 2020-03-13 DIAGNOSIS — C771 Secondary and unspecified malignant neoplasm of intrathoracic lymph nodes: Secondary | ICD-10-CM | POA: Insufficient documentation

## 2020-03-13 DIAGNOSIS — R160 Hepatomegaly, not elsewhere classified: Secondary | ICD-10-CM | POA: Insufficient documentation

## 2020-03-13 DIAGNOSIS — Z809 Family history of malignant neoplasm, unspecified: Secondary | ICD-10-CM | POA: Insufficient documentation

## 2020-03-13 NOTE — Progress Notes (Signed)
Nutrition Follow-up:  Patient with pancreatic cancer with mets to liver.  Patient receiving chemotherapy.    Spoke with patient via phone for nutrition follow-up.  Patient reports that his appetite is good.  Eating 3 meals per day and drinking about 3 shakes per day.  Drank a boost this am and ate tomato and toast.  Reports planning on having beef, few potatoes and carrots for lunch.  Denies nausea.     Medications: reviewed  Labs: reviewed  Anthropometrics:   Weight 170 lb increased from 165 lb on 4/30 179 lb on 3/15   NUTRITION DIAGNOSIS: Inadequate oral intake improving    INTERVENTION:  Encouraged eating high calorie, high protein foods Encouraged oral nutrition supplements TID if able Patient has contact information    MONITORING, EVALUATION, GOAL: weight trends, intake   NEXT VISIT: as needed  Lella Mullany B. Zenia Resides, [place], [place] Registered Dietitian [phone] (pager)

## 2020-03-17 ENCOUNTER — Encounter: Payer: Self-pay | Admitting: Oncology

## 2020-03-17 ENCOUNTER — Inpatient Hospital Stay: Payer: Medicare Other

## 2020-03-17 ENCOUNTER — Inpatient Hospital Stay (HOSPITAL_BASED_OUTPATIENT_CLINIC_OR_DEPARTMENT_OTHER): Payer: Medicare Other | Admitting: Oncology

## 2020-03-17 ENCOUNTER — Other Ambulatory Visit: Payer: Self-pay | Admitting: Physician Assistant

## 2020-03-17 ENCOUNTER — Other Ambulatory Visit: Payer: Self-pay

## 2020-03-17 VITALS — BP 137/80 | HR 82 | Temp 96.4°F | Resp 18 | Wt 171.7 lb

## 2020-03-17 DIAGNOSIS — T451X5A Adverse effect of antineoplastic and immunosuppressive drugs, initial encounter: Secondary | ICD-10-CM | POA: Diagnosis not present

## 2020-03-17 DIAGNOSIS — C259 Malignant neoplasm of pancreas, unspecified: Secondary | ICD-10-CM

## 2020-03-17 DIAGNOSIS — K760 Fatty (change of) liver, not elsewhere classified: Secondary | ICD-10-CM | POA: Diagnosis not present

## 2020-03-17 DIAGNOSIS — M8580 Other specified disorders of bone density and structure, unspecified site: Secondary | ICD-10-CM | POA: Diagnosis not present

## 2020-03-17 DIAGNOSIS — N400 Enlarged prostate without lower urinary tract symptoms: Secondary | ICD-10-CM | POA: Diagnosis not present

## 2020-03-17 DIAGNOSIS — E876 Hypokalemia: Secondary | ICD-10-CM | POA: Diagnosis not present

## 2020-03-17 DIAGNOSIS — G720 Drug-induced myopathy: Secondary | ICD-10-CM

## 2020-03-17 DIAGNOSIS — C771 Secondary and unspecified malignant neoplasm of intrathoracic lymph nodes: Secondary | ICD-10-CM | POA: Diagnosis not present

## 2020-03-17 DIAGNOSIS — I700 Atherosclerosis of aorta: Secondary | ICD-10-CM | POA: Diagnosis not present

## 2020-03-17 DIAGNOSIS — R109 Unspecified abdominal pain: Secondary | ICD-10-CM | POA: Diagnosis not present

## 2020-03-17 DIAGNOSIS — I739 Peripheral vascular disease, unspecified: Secondary | ICD-10-CM | POA: Diagnosis not present

## 2020-03-17 DIAGNOSIS — R5383 Other fatigue: Secondary | ICD-10-CM | POA: Diagnosis not present

## 2020-03-17 DIAGNOSIS — D6481 Anemia due to antineoplastic chemotherapy: Secondary | ICD-10-CM | POA: Diagnosis not present

## 2020-03-17 DIAGNOSIS — K219 Gastro-esophageal reflux disease without esophagitis: Secondary | ICD-10-CM | POA: Diagnosis not present

## 2020-03-17 DIAGNOSIS — N281 Cyst of kidney, acquired: Secondary | ICD-10-CM | POA: Diagnosis not present

## 2020-03-17 DIAGNOSIS — Z5111 Encounter for antineoplastic chemotherapy: Secondary | ICD-10-CM | POA: Diagnosis not present

## 2020-03-17 DIAGNOSIS — R5382 Chronic fatigue, unspecified: Secondary | ICD-10-CM | POA: Diagnosis not present

## 2020-03-17 DIAGNOSIS — I2510 Atherosclerotic heart disease of native coronary artery without angina pectoris: Secondary | ICD-10-CM | POA: Diagnosis not present

## 2020-03-17 DIAGNOSIS — C787 Secondary malignant neoplasm of liver and intrahepatic bile duct: Secondary | ICD-10-CM | POA: Diagnosis not present

## 2020-03-17 DIAGNOSIS — C7800 Secondary malignant neoplasm of unspecified lung: Secondary | ICD-10-CM | POA: Diagnosis not present

## 2020-03-17 DIAGNOSIS — E1151 Type 2 diabetes mellitus with diabetic peripheral angiopathy without gangrene: Secondary | ICD-10-CM | POA: Diagnosis not present

## 2020-03-17 DIAGNOSIS — C7951 Secondary malignant neoplasm of bone: Secondary | ICD-10-CM | POA: Diagnosis not present

## 2020-03-17 DIAGNOSIS — R599 Enlarged lymph nodes, unspecified: Secondary | ICD-10-CM | POA: Diagnosis not present

## 2020-03-17 DIAGNOSIS — E042 Nontoxic multinodular goiter: Secondary | ICD-10-CM | POA: Diagnosis not present

## 2020-03-17 DIAGNOSIS — R160 Hepatomegaly, not elsewhere classified: Secondary | ICD-10-CM | POA: Diagnosis not present

## 2020-03-17 LAB — CBC WITH DIFFERENTIAL/PLATELET
Abs Immature Granulocytes: 0.03 10*3/uL (ref 0.00–0.07)
Basophils Absolute: 0.1 10*3/uL (ref 0.0–0.1)
Basophils Relative: 1 %
Eosinophils Absolute: 0.2 10*3/uL (ref 0.0–0.5)
Eosinophils Relative: 4 %
HCT: 32.3 % — ABNORMAL LOW (ref 39.0–52.0)
Hemoglobin: 10.2 g/dL — ABNORMAL LOW (ref 13.0–17.0)
Immature Granulocytes: 1 %
Lymphocytes Relative: 17 %
Lymphs Abs: 0.8 10*3/uL (ref 0.7–4.0)
MCH: 29.4 pg (ref 26.0–34.0)
MCHC: 31.6 g/dL (ref 30.0–36.0)
MCV: 93.1 fL (ref 80.0–100.0)
Monocytes Absolute: 0.5 10*3/uL (ref 0.1–1.0)
Monocytes Relative: 10 %
Neutro Abs: 3.2 10*3/uL (ref 1.7–7.7)
Neutrophils Relative %: 67 %
Platelets: 183 10*3/uL (ref 150–400)
RBC: 3.47 MIL/uL — ABNORMAL LOW (ref 4.22–5.81)
RDW: 17.2 % — ABNORMAL HIGH (ref 11.5–15.5)
WBC: 4.8 10*3/uL (ref 4.0–10.5)
nRBC: 0 % (ref 0.0–0.2)

## 2020-03-17 LAB — COMPREHENSIVE METABOLIC PANEL
ALT: 21 U/L (ref 0–44)
AST: 19 U/L (ref 15–41)
Albumin: 3.5 g/dL (ref 3.5–5.0)
Alkaline Phosphatase: 64 U/L (ref 38–126)
Anion gap: 9 (ref 5–15)
BUN: 20 mg/dL (ref 8–23)
CO2: 25 mmol/L (ref 22–32)
Calcium: 8.7 mg/dL — ABNORMAL LOW (ref 8.9–10.3)
Chloride: 108 mmol/L (ref 98–111)
Creatinine, Ser: 0.91 mg/dL (ref 0.61–1.24)
GFR calc Af Amer: >60 mL/min (ref >60)
GFR calc non Af Amer: >60 mL/min (ref >60)
Glucose, Bld: 145 mg/dL — ABNORMAL HIGH (ref 70–99)
Potassium: 4 mmol/L (ref 3.5–5.1)
Sodium: 142 mmol/L (ref 135–145)
Total Bilirubin: 0.7 mg/dL (ref 0.3–1.2)
Total Protein: 6.3 g/dL — ABNORMAL LOW (ref 6.5–8.1)

## 2020-03-17 MED ORDER — PALONOSETRON HCL INJECTION 0.25 MG/5ML
0.2500 mg | Freq: Once | INTRAVENOUS | Status: AC
  Administered 2020-03-17: 0.25 mg via INTRAVENOUS
  Filled 2020-03-17: qty 5

## 2020-03-17 MED ORDER — HEPARIN SOD (PORK) LOCK FLUSH 100 UNIT/ML IV SOLN
500.0000 [IU] | Freq: Once | INTRAVENOUS | Status: DC
  Filled 2020-03-17: qty 5

## 2020-03-17 MED ORDER — SODIUM CHLORIDE 0.9 % IV SOLN
68.0000 mg/m2 | Freq: Once | INTRAVENOUS | Status: AC
  Administered 2020-03-17: 129 mg via INTRAVENOUS
  Filled 2020-03-17: qty 30

## 2020-03-17 MED ORDER — SODIUM CHLORIDE 0.9% FLUSH
10.0000 mL | Freq: Once | INTRAVENOUS | Status: AC
  Administered 2020-03-17: 10 mL via INTRAVENOUS
  Filled 2020-03-17: qty 10

## 2020-03-17 MED ORDER — SODIUM CHLORIDE 0.9 % IV SOLN
2400.0000 mg/m2 | INTRAVENOUS | Status: DC
  Administered 2020-03-17: 4550 mg via INTRAVENOUS
  Filled 2020-03-17: qty 91

## 2020-03-17 MED ORDER — SODIUM CHLORIDE 0.9 % IV SOLN
421.0000 mg/m2 | Freq: Once | INTRAVENOUS | Status: AC
  Administered 2020-03-17: 800 mg via INTRAVENOUS
  Filled 2020-03-17: qty 10

## 2020-03-17 MED ORDER — SODIUM CHLORIDE 0.9 % IV SOLN
10.0000 mg | Freq: Once | INTRAVENOUS | Status: AC
  Administered 2020-03-17: 10 mg via INTRAVENOUS
  Filled 2020-03-17: qty 10

## 2020-03-17 MED ORDER — SODIUM CHLORIDE 0.9 % IV SOLN
Freq: Once | INTRAVENOUS | Status: AC
  Filled 2020-03-17: qty 250

## 2020-03-17 NOTE — Progress Notes (Signed)
Hematology/Oncology  Follow up note [facility] [phone] [phone]   Patient Care Team: Paulene Floor as PCP - General (Physician Assistant) Clent Jacks, RN as Oncology Nurse Navigator Earlie Server, MD as Consulting Physician (Hematology and Oncology) Neldon Labella, RN as Case Manager Lonia Farber, MD as Consulting Physician (Internal Medicine)  REFERRING PROVIDER: Trinna Post, PA-C  CHIEF COMPLAINTS/REASON FOR VISIT:  Follow up for treatment of pancreatic cancer  HISTORY OF PRESENTING ILLNESS:   Craig [name] is a  74 y.o.  male with PMH listed below, including CABG x5, PVD, hypertension, former tobacco abuse, former alcohol abuse, iron deficiency anemia, was seen in consultation at the request of  Terrilee Croak, Adriana M, PA-C  for evaluation of abnormal CT Patient recently presented to primary care provider complaining for abdominal pain radiating to his back for about 10 days.  Patient uses Tylenol as needed for pain. Work-up showed elevated amylase, lipase, mild anemia with hemoglobin of 11.3 Acute hepatitis panel negative. 05/16/2019 CT abdomen pelvis with contrast showed poorly marginated heterogeneous hypodense 2.9 cm pancreatic mass at the uncinate process, worrisome for primary pancreatic cancer.  No biliary or pancreatic duct dilation. Several ill defined hypodense liver masses scattered throughout the liver, largest 3.1 cm in the segment 6 right liver lobe. Nonspecific portacaval and aortocaval adenopathy. Extreme medial left lung base 4.4 cm pleural-based mass probably a pleural metastasis.  Additional tiny pulmonary nodules scattered at the right lung base are indeterminate. Mild prostate or megaly  He had a history of 37.5-pack-year smoking history quitting 2018. No longer drinking alcohol.  # ultrasound-guided liver biopsy on 05/21/2019.  Pathology showed fragments of benign hepatic parenchyma showing  minimal macro vascular steatosis, otherwise no significant histo pathologic change. Single core of renal tissue showing approximately 25% glomerulosclerosis  # #History of CAD, status post CABG x5.  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  # Omniseq test showed PD-L1 50% TPS, TMB high, negative for BRCA1/2 or NTRK fusion.  MSI could not be completed.  Patient declines genetic testing  #  third line chemotherapy treatment 5-FU with liposomal irinotecan 01/24/2020-01/26/2020 patient was admitted to ICU due to DKA. on long-acting insulin 20 units at night and takes sliding scale with meals.  INTERVAL HISTORY Craig [name] is a 74 y.o. male who has above history reviewed by me today presents for evaluation of chemotherapy tolerability for metastatic pancreatic cancer.   Problems and complaints are listed below: Patient is on third line chemotherapy treatments.   He reports chronic fatigue unchanged at baseline.  Is been working the garden planting vegetables. No new complaints today   .[name] Kitchen Review of Systems  Constitutional: Positive for fatigue. Negative for appetite change, chills, fever and unexpected weight change.  HENT:   Negative for hearing loss and voice change.   Eyes: Negative for eye problems and icterus.  Respiratory: Negative for chest tightness, cough and shortness of breath.   Cardiovascular: Negative for chest pain and leg swelling.  Gastrointestinal: Negative for abdominal distention and abdominal pain.  Endocrine: Negative for hot flashes.  Genitourinary: Negative for difficulty urinating, dysuria and frequency.   Musculoskeletal: Negative for arthralgias.  Skin: Negative for itching and rash.  Neurological: Positive for numbness. Negative for light-headedness.  Hematological: Negative for adenopathy. Does not bruise/bleed easily.   Psychiatric/Behavioral: Negative for confusion.    MEDICAL HISTORY:  Past Medical  History:  Diagnosis Date  . Allergic rhinitis   . Cancer [facility])    new dx Pancreatic Cancer - Aug 2020  . CHF (congestive heart failure) ([place])   . Chronic cholecystitis   . Coronary artery disease   . DDD (degenerative disc disease), cervical   . Dehydration 06/21/2019  . Diabetes mellitus without complication ([place])   . Dyspnea   . Early cataract   . Erectile dysfunction   . GERD (gastroesophageal reflux disease)   . Gouty arthritis   . Headache    occasional migraines  . Hypercalcemia   . Hyperlipemia   . Hypertension   . Palpitations   . Peripheral vascular disease ([place])   . 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   LIMA to LAD SVG to [place] SVG SEQUENTIALLY to OM1 and OM2 SVG to ACUTE MARGINAL  . Spinal stenosis of cervical region   . Vitamin D deficiency     SURGICAL HISTORY: Past Surgical History:  Procedure Laterality Date  . BACK SURGERY  2018    fusion with screws  . CHOLECYSTECTOMY N/A 11/13/2018   Procedure: LAPAROSCOPIC CHOLECYSTECTOMY;  Surgeon: Vickie Epley, MD;  Location: [facility] ORS;  Service: General;  Laterality: N/A;  . COLONOSCOPY    . CORONARY ARTERY BYPASS GRAFT N/A 09/30/2017   Procedure: CORONARY ARTERY BYPASS GRAFTING times five using right and left Saphaneous vein harvested endoscopicly  and left internal mammary artery. (CABG),TEE;  Surgeon: Rexene Alberts, MD;  Location: [place];  Service: Open Heart Surgery;  Laterality: N/A;  . LEFT HEART CATH AND CORONARY ANGIOGRAPHY Left 09/06/2017   Procedure: LEFT HEART CATH AND CORONARY ANGIOGRAPHY;  Surgeon: Corey Skains, MD;  Location: [place] [facility];  Service: Cardiovascular;  Laterality: Left;  . percutaneous transluminal balloon angioplasty  01/2010   of left lower extremity  . PORTA CATH INSERTION N/A 06/06/2019   Procedure: PORTA CATH INSERTION;  Surgeon: Katha Cabal, MD;  Location: [place] [facility];  Service: Cardiovascular;  Laterality: N/A;  . TEE WITHOUT CARDIOVERSION N/A 09/30/2017   Procedure: TRANSESOPHAGEAL ECHOCARDIOGRAM (TEE);  Surgeon: Rexene Alberts, MD;  Location: [place];  Service: Open Heart Surgery;  Laterality: N/A;  . VASCULAR SURGERY Left 2010   left exernal iliac and superficial femoral artery PTCA and stenting    SOCIAL HISTORY: Social History   Socioeconomic History  . Marital status: Married    Spouse name: Enid Derry  . Number of children: 2  . Years of education: Not on file  . Highest education level: 5th grade  Occupational History  . Occupation: drove tractors    Comment: retired  Tobacco Use  . Smoking status: Former Smoker    Packs/day: 0.75    Years: 50.00    Pack years: 37.50    Types: Cigarettes    Quit date: 09/27/2017    Years since quitting: 2.4  . Smokeless tobacco: Former Systems developer    Types: Chew  Substance and Sexual Activity  . Alcohol use: Not Currently    Comment: stopped drinking before cabg  . Drug use: Not Currently    Types: Marijuana    Comment: stopped smoking before CABG  . Sexual activity: Not Currently  Other Topics Concern  . Not on file  Social History Narrative  . Not on file   Social Determinants of Health   Financial Resource Strain: Low Risk   . Difficulty of Paying Living Expenses: Not hard at all  Food Insecurity: No Food Insecurity  .  Worried About Charity fundraiser in the Last Year: Never true  . Ran Out of Food in the Last Year: Never true  Transportation Needs: No Transportation Needs  . Lack of Transportation (Medical): No  . Lack of Transportation (Non-Medical): No  Physical Activity: Inactive  . Days of Exercise per Week: 0 days  . Minutes of Exercise per Session: 0 min  Stress: No Stress Concern Present  . Feeling of Stress : Not at all  Social Connections: Somewhat Isolated  . Frequency of Communication with Friends and Family: More than three times a week  . Frequency of Social Gatherings  with Friends and Family: More than three times a week  . Attends Religious Services: Never  . Active Member of Clubs or Organizations: No  . Attends Archivist Meetings: Never  . Marital Status: Married  Human resources officer Violence: Not At Risk  . Fear of Current or Ex-Partner: No  . Emotionally Abused: No  . Physically Abused: No  . Sexually Abused: No    FAMILY HISTORY: Family History  Problem Relation Age of Onset  . Brain cancer Mother   . Emphysema Father   . Cancer Brother     ALLERGIES:  is allergic to lipitor [atorvastatin]; zetia [ezetimibe]; lisinopril; protonix [pantoprazole]; and spironolactone.  MEDICATIONS:  Current Outpatient Medications  Medication Sig Dispense Refill  . aspirin EC 81 MG tablet Take 81 mg by mouth daily.    . carvedilol (COREG) 6.25 MG tablet Take 6.25 mg by mouth 2 (two) times daily with a meal.  3  . Continuous Blood Gluc Receiver (FREESTYLE LIBRE 14 DAY READER) DEVI 1 Device by Does not apply route every 14 (fourteen) days. 6 each 1  . diltiazem (CARDIZEM CD) 300 MG 24 hr capsule TAKE 1 CAPSULE BY MOUTH EVERY DAY 90 capsule 1  . dorzolamide-timolol (COSOPT) 22.3-6.8 MG/ML ophthalmic solution INSTILL ONE DROP INTO BOTH EYES TWICE DAILY    . ferrous sulfate 325 (65 FE) MG tablet TAKE 1 TABLET BY MOUTH EVERY DAY WITH BREAKFAST 90 tablet 1  . gabapentin (NEURONTIN) 300 MG capsule TAKE 2 CAPSULES (600 MG TOTAL) BY MOUTH 3 (THREE) TIMES DAILY. 540 capsule 0  . ibuprofen (ADVIL) 800 MG tablet Take 1 tablet (800 mg total) by mouth every 8 (eight) hours as needed. 30 tablet 0  . insulin glargine (LANTUS SOLOSTAR) 100 UNIT/ML Solostar Pen Inject 20 Units into the skin at bedtime. 15 mL 0  . insulin lispro (HUMALOG) 100 UNIT/ML KwikPen Inject 0.05 mLs (5 Units total) into the skin 3 (three) times daily with meals. 15 mL 11  . Insulin Pen Needle (PEN NEEDLES) 32G X 5 MM MISC Use four times daily for insulin 400 each 1  . latanoprost (XALATAN) 0.005  % ophthalmic solution Place 1 drop into both eyes at bedtime.   3  . lidocaine-prilocaine (EMLA) cream APPLY TO AFFECTED AREA ONCE AS DIRECTED    . loperamide (IMODIUM A-D) 2 MG tablet Take 1 tablet (2 mg total) by mouth See admin instructions. Take 2 at diarrhea onset , then 1 every 2hr until 12hrs with no BM. May take 2 every 4hrs at night. If diarrhea recurs repeat. 100 tablet 1  . Multiple Vitamin (MULTIVITAMIN WITH MINERALS) TABS tablet Take 1 tablet by mouth daily.    . OMEGA-3 FATTY ACIDS PO Take 1,200 mg by mouth daily.     [name] Kitchen omeprazole (PRILOSEC) 40 MG capsule TAKE 1 CAPSULE BY MOUTH EVERY DAY 90 capsule 1  .  ONETOUCH VERIO test strip TEST FASTING SUGAR EVERY MORNING 100 strip 3  . oxyCODONE (OXY IR/ROXICODONE) 5 MG immediate release tablet Take 1 tablet (5 mg total) by mouth every 6 (six) hours as needed for severe pain. 30 tablet 0  . potassium chloride (KLOR-CON) 10 MEQ tablet TAKE 1 TABLET BY MOUTH EVERY DAY 30 tablet 0   No current facility-administered medications for this visit.   Facility-Administered Medications Ordered in Other Visits  Medication Dose Route Frequency Provider Last Rate Last Admin  . heparin lock flush 100 unit/mL  500 Units Intravenous Once Earlie Server, MD      . sodium chloride flush (NS) 0.9 % injection 10 mL  10 mL Intracatheter PRN Earlie Server, MD      . sodium chloride flush (NS) 0.9 % injection 10 mL  10 mL Intravenous Once Earlie Server, MD         PHYSICAL EXAMINATION: ECOG PERFORMANCE STATUS: 1 - Symptomatic but completely ambulatory Vitals:   03/17/20 0831  BP: 137/80  Pulse: 82  Resp: 18  Temp: (!) 96.4 F (35.8 C)   Filed Weights   03/17/20 0831  Weight: 171 lb 11.2 oz (77.9 kg)    Physical Exam Constitutional:      General: He is not in acute distress. HENT:     Head: Normocephalic and atraumatic.  Eyes:     General: No scleral icterus. Cardiovascular:     Rate and Rhythm: Normal rate and regular rhythm.     Heart sounds: Normal heart  sounds.  Pulmonary:     Effort: Pulmonary effort is normal. No respiratory distress.     Breath sounds: No wheezing.  Abdominal:     General: Bowel sounds are normal. There is no distension.     Palpations: Abdomen is soft.  Musculoskeletal:        General: No deformity. Normal range of motion.     Cervical back: Normal range of motion and neck supple.  Skin:    General: Skin is warm and dry.     Findings: No erythema or rash.  Neurological:     Mental Status: He is alert and oriented to person, place, and time. Mental status is at baseline.     Cranial Nerves: No cranial nerve deficit.     Coordination: Coordination normal.  Psychiatric:        Mood and Affect: Mood normal.     LABORATORY DATA:  I have reviewed the data as listed Lab Results  Component Value Date   WBC 4.8 03/17/2020   HGB 10.2 (L) 03/17/2020   HCT 32.3 (L) 03/17/2020   MCV 93.1 03/17/2020   PLT 183 03/17/2020   Recent Labs    02/18/20 0820 03/03/20 0818 03/17/20 0806  NA 140 141 142  K 3.4* 4.0 4.0  CL 106 109 108  CO2 _0 GLUCOSE 161* 121* 145*  BUN _1 CREATININE 0.90 0.85 0.91  CALCIUM 8.4* 8.4* 8.7*  GFRNONAA >60 >60 >60  GFRAA >60 >60 >60  PROT 6.4* 6.3* 6.3*  ALBUMIN 3.3* 3.4* 3.5  AST _2 ALT 36 25 21  ALKPHOS 86 73 64  BILITOT 0.7 0.6 0.7   Iron/TIBC/Ferritin/ %Sat    Component Value Date/Time   IRON 119 04/27/2019 0940   TIBC 363 04/27/2019 0940   FERRITIN 58 04/27/2019 0940   IRONPCTSAT 33 04/27/2019 0940      RADIOGRAPHIC STUDIES: I have personally reviewed the radiological images  as listed and agreed with the findings in the report. CT CHEST W CONTRAST  Result Date: 01/02/2020 CLINICAL DATA:  74 year old male with history of pancreatic cancer with liver metastasis. Elevated PSA. EXAM: CT CHEST, ABDOMEN, AND PELVIS WITH CONTRAST TECHNIQUE: Multidetector CT imaging of the chest, abdomen and pelvis was performed following the standard protocol during  bolus administration of intravenous contrast. CONTRAST:  171m OMNIPAQUE IOHEXOL 300 MG/ML  SOLN COMPARISON:  CT of the chest abdomen pelvis dated 11/05/2019. FINDINGS: CT CHEST FINDINGS Cardiovascular: There is no cardiomegaly or pericardial effusion. Three-vessel coronary vascular calcification and postsurgical changes of CABG. There is advanced atherosclerotic calcification of the thoracic aorta. No aneurysmal dilatation or dissection. The origins of the great vessels of the aortic arch appear patent as visualized. The central pulmonary arteries appear patent for the degree of opacification. Mediastinum/Nodes: No hilar or mediastinal adenopathy. The esophagus is grossly unremarkable. Several subcentimeter hypodense thyroid nodules. No followup recommended (Ref: J Am Coll Radiol. 2015 Feb;12(2): 143-50). No mediastinal fluid collection. Lungs/Pleura: There is a 4.2 x 3.0 cm ovoid soft tissue mass involving the medial left lung base similar to prior CT. 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 Several small subpleural nodules primarily in the right lung. A 3 mm right upper lobe nodule (series 5, image 64) similar or minimally increased in size. There is a 5 mm subpleural nodule in the right lower lobe (series 5, image 99) new since the prior CT. Additional 5 mm right lung base subpleural nodule (series 5, image 14) also appears new since the prior CT. There is an 8 mm nodule at the left lung base (series 5, image 111) which has significantly increased in size since the prior CT. A 4 mm left lower lobe nodule (series 5, image 103) is new since the prior CT. These nodules are concerning for metastatic disease. There is no focal consolidation, pleural effusion, pneumothorax. The central airways are patent. Musculoskeletal: Median sternotomy wires. Right pectoral Port-A-Cath with tip at the cavoatrial junction. There is osteopenia with degenerative changes of the spine. No acute  osseous pathology. No definite suspicious osseous lesions. CT ABDOMEN PELVIS FINDINGS No intra-abdominal free air or free fluid. Hepatobiliary: There is a background of fatty infiltration of the liver. Interval increase in the size and number of hepatic hypodense lesions consistent with progression of metastatic disease. The largest lesion measures approximately 3.4 x 2.8 cm in the right lobe of the liver (previously 2.0 x 1.1 cm). No intrahepatic biliary ductal dilatation. Cholecystectomy. Pancreas: There is a 4.0 x 3.0 cm (previously 3.5 x 2.8 cm) soft tissue lesion in the region of the uncinate process of the pancreas (series 3, image 64). There is mild diffuse pancreatic atrophy and dilatation of the main pancreatic duct measuring up to 5 mm in diameter and progressed since the prior CT. There is mild haziness of the peripancreatic fat, likely reactive. Spleen: Normal in size without focal abnormality. Adrenals/Urinary Tract: The adrenal glands are unremarkable. There is no hydronephrosis on either side. Bilateral renal cysts measuring up to 4.3 cm in the inferior pole of the right kidney. There is symmetric enhancement and excretion of contrast by both kidneys. The visualized ureters and urinary bladder appear unremarkable. Stomach/Bowel: There is moderate stool throughout the colon. No bowel obstruction or active inflammation. The appendix is normal. Vascular/Lymphatic: Advanced aortoiliac atherosclerotic disease. The IVC is grossly unremarkable. Probable mild narrowing of the porta splenic confluence secondary to pancreatic head mass. The SMV,  splenic vein, and main portal vein are patent. No portal venous gas. Mildly enlarged pancreatico caval lymph node measures 15 mm (series 3, image 59). Reproductive: Mildly enlarged prostate gland. The seminal vesicles are symmetric. Other: None Musculoskeletal: Osteopenia with degenerative changes of the spine. L4-L5 posterior fusion and disc spacer. No acute osseous  pathology. IMPRESSION: 1. Overall progression of hepatic and pulmonary metastatic disease since the prior CT of 11/05/2019. 2. Interval increase in the size of the pancreatic head mass and pancreatic duct dilatation. 3. A 15 mm peripancreatic lymph node, increased in size since the prior CT. 4. Ovoid soft tissue mass in the medial left lung base, similar to prior CT. 5. 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 6. No bowel obstruction. Normal appendix. 7. Aortic Atherosclerosis (ICD10-I70.0). Electronically Signed   By: Anner Crete M.D.   On: 01/02/2020 18:49   CT ABDOMEN PELVIS W CONTRAST  Result Date: 01/02/2020 CLINICAL DATA:  74 year old male with history of pancreatic cancer with liver metastasis. Elevated PSA. EXAM: CT CHEST, ABDOMEN, AND PELVIS WITH CONTRAST TECHNIQUE: Multidetector CT imaging of the chest, abdomen and pelvis was performed following the standard protocol during bolus administration of intravenous contrast. CONTRAST:  123m OMNIPAQUE IOHEXOL 300 MG/ML  SOLN COMPARISON:  CT of the chest abdomen pelvis dated 11/05/2019. FINDINGS: CT CHEST FINDINGS Cardiovascular: There is no cardiomegaly or pericardial effusion. Three-vessel coronary vascular calcification and postsurgical changes of CABG. There is advanced atherosclerotic calcification of the thoracic aorta. No aneurysmal dilatation or dissection. The origins of the great vessels of the aortic arch appear patent as visualized. The central pulmonary arteries appear patent for the degree of opacification. Mediastinum/Nodes: No hilar or mediastinal adenopathy. The esophagus is grossly unremarkable. Several subcentimeter hypodense thyroid nodules. No followup recommended (Ref: J Am Coll Radiol. 2015 Feb;12(2): 143-50). No mediastinal fluid collection. Lungs/Pleura: There is a 4.2 x 3.0 cm ovoid soft tissue mass involving the medial left lung base similar to prior CT. This was favored to represent an incidental  benign fibrous tumor of the pleura. Several small subpleural nodules primarily in the right lung. A 3 mm right upper lobe nodule (series 5, image 64) similar or minimally increased in size. There is a 5 mm subpleural nodule in the right lower lobe (series 5, image 99) new since the prior CT. Additional 5 mm right lung base subpleural nodule (series 5, image 14) also appears new since the prior CT. There is an 8 mm nodule at the left lung base (series 5, image 111) which has significantly increased in size since the prior CT. A 4 mm left lower lobe nodule (series 5, image 103) is new since the prior CT. These nodules are concerning for metastatic disease. There is no focal consolidation, pleural effusion, pneumothorax. The central airways are patent. Musculoskeletal: Median sternotomy wires. Right pectoral Port-A-Cath with tip at the cavoatrial junction. There is osteopenia with degenerative changes of the spine. No acute osseous pathology. No definite suspicious osseous lesions. CT ABDOMEN PELVIS FINDINGS No intra-abdominal free air or free fluid. Hepatobiliary: There is a background of fatty infiltration of the liver. Interval increase in the size and number of hepatic hypodense lesions consistent with progression of metastatic disease. The largest lesion measures approximately 3.4 x 2.8 cm in the right lobe of the liver (previously 2.0 x 1.1 cm). No intrahepatic biliary ductal dilatation. Cholecystectomy. Pancreas: There is a 4.0 x 3.0 cm (previously 3.5 x 2.8 cm) soft tissue lesion in the region of the uncinate process of  the pancreas (series 3, image 64). There is mild diffuse pancreatic atrophy and dilatation of the main pancreatic duct measuring up to 5 mm in diameter and progressed since the prior CT. There is mild haziness of the peripancreatic fat, likely reactive. Spleen: Normal in size without focal abnormality. Adrenals/Urinary Tract: The adrenal glands are unremarkable. There is no hydronephrosis on  either side. Bilateral renal cysts measuring up to 4.3 cm in the inferior pole of the right kidney. There is symmetric enhancement and excretion of contrast by both kidneys. The visualized ureters and urinary bladder appear unremarkable. Stomach/Bowel: There is moderate stool throughout the colon. No bowel obstruction or active inflammation. The appendix is normal. Vascular/Lymphatic: Advanced aortoiliac atherosclerotic disease. The IVC is grossly unremarkable. Probable mild narrowing of the porta splenic confluence secondary to pancreatic head mass. The SMV, splenic vein, and main portal vein are patent. No portal venous gas. Mildly enlarged pancreatico caval lymph node measures 15 mm (series 3, image 59). Reproductive: Mildly enlarged prostate gland. The seminal vesicles are symmetric. Other: None Musculoskeletal: Osteopenia with degenerative changes of the spine. L4-L5 posterior fusion and disc spacer. No acute osseous pathology. IMPRESSION: 1. Overall progression of hepatic and pulmonary metastatic disease since the prior CT of 11/05/2019. 2. Interval increase in the size of the pancreatic head mass and pancreatic duct dilatation. 3. A 15 mm peripancreatic lymph node, increased in size since the prior CT. 4. Ovoid soft tissue mass in the medial left lung base, similar to prior CT. 5. 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 6. No bowel obstruction. Normal appendix. 7. Aortic Atherosclerosis (ICD10-I70.0). Electronically Signed   By: Anner Crete M.D.   On: 01/02/2020 18:49      ASSESSMENT & PLAN:  1. Pancreatic cancer metastasized to liver ([place])   2. Encounter for antineoplastic chemotherapy   3. Anemia due to antineoplastic chemotherapy   4. Hypokalemia     #Metastatic pancreatic cancer- liver mets, hilar nodal metastasis, ?bone mets, CA 19-9 trending down to 10,000s.  Today's CA 19-9 is pending. Labs are reviewed and discussed with patient.  Counts acceptable to  proceed with 5-FU/liposomal irinotecan.  #Chronic hypokalemia, K has improved to 4.  Continue potassium chloride 10 mEq daily #  diabetes/ hyperglycemia, hyperglycemia is better controlled now on insulin regimen Follows up with primary care provider for management.  #Normocytic anemia, likely secondary to chemo.  Hemoglobin stable.  10.3 today.  #All questions were answered. The patient knows to call the clinic with any problems questions or concerns. Follow-up  2 weeks.   Earlie Server, MD, PhD Hematology Oncology [place] [facility] 03/17/2020

## 2020-03-17 NOTE — Progress Notes (Signed)
Pt here for follow up and next chemo treatment. Offers no complaints. States is feeling well.

## 2020-03-18 NOTE — Patient Instructions (Signed)
Thank you for allowing the Chronic Care Management team to participate in your care.  Goals Addressed            This Visit's Progress   . Chronic Disease Management       CARE PLAN ENTRY (see longitudinal plan of care for additional care plan information)  Current Barriers:  . Chronic Disease Management support and education needs related to Hypertension, Coronary Artery Disease, Diabetes, GERD and pancreatic cancer.  Nurse Case Manager Clinical Goal(s):  [name] Kitchen Over the next 90 days, patient will not experience unplanned hospital admission. . Over the next 90 days, patient will take all medications as prescribed. . Over the next 90 days, patient will attend all scheduled medical appointments. . Over the next 90 days, patient will follow provider recommendations regarding blood glucose monitoring. . Over the next 30 days, patient will obtain prescribed Freestyle Libre continuous glucose monitoring device. . Over the next 90 days, patient will work with care management team and provide updates regarding goals of care and changes in health needs.   Interventions:  . Inter-disciplinary care team collaboration (see longitudinal plan of care) . Reviewed medications. Reports self-administering medications as prescribed. Denies concerns regarding prescription costs. . Reviewed blood glucose parameters along with recommended interventions. Discussed  recent readings. Reports most fasting readings have ranged between 120-130's. He noted a few high readings of 165mg /dl and 209 mg/dl. Confirmed receipt of Freestyle Libre device. Requested assistance with obtaining supply refills. . Reviewed pending appointments. Continues to attend appointments as scheduled. No concerns regarding transportation. . Discussed plans for ongoing care management and follow up. Provided direct contact information for care management team. Reports good family support. Declines current need for additional assistance in the  home.   Patient Self Care Activities:  . Self administers medications . Attends scheduled provider appointments . Calls pharmacy for medication refills . Attends church or other social activities . Performs ADL's independently . Performs IADL's independently . Calls provider office for new concerns or questions   Please see past updates related to this goal by clicking on the "Past Updates" button in the selected goal         Craig [name] verbalized awareness of the instructions provided during the telephonic outreach today. Declined need for printed/mailed instructions.    The care management team will follow-up with Craig [name] within the next three weeks.   [name] [facility] Care Management [phone]

## 2020-03-18 NOTE — Chronic Care Management (AMB) (Signed)
Chronic Care Management   Follow Up Note    Name: Craig [name] MRN: [id number] DOB: January 15, 1946  Primary Care Provider: Trinna Post, PA-C Reason for referral : Chronic Care Management   Craig [name] is a 74 y.o. year old male who is a primary care patient of Paulene Floor. He is currently engaged with the chronic care management team. A routine telephonic outreach was conducted.  Review of Craig [name] status, including review of consultants reports, relevant labs and test results was conducted today. Collaboration with appropriate care team members was performed as part of the comprehensive evaluation and provision of chronic care management services.    SDOH (Social Determinants of Health) assessments performed: No     Outpatient Encounter Medications as of 03/04/2020  Medication Sig Note  . aspirin EC 81 MG tablet Take 81 mg by mouth daily.   . carvedilol (COREG) 6.25 MG tablet Take 6.25 mg by mouth 2 (two) times daily with a meal.   . Continuous Blood Gluc Receiver (FREESTYLE LIBRE 14 DAY READER) DEVI 1 Device by Does not apply route every 14 (fourteen) days. 02/12/2020: Pending  . diltiazem (CARDIZEM CD) 300 MG 24 hr capsule TAKE 1 CAPSULE BY MOUTH EVERY DAY   . dorzolamide-timolol (COSOPT) 22.3-6.8 MG/ML ophthalmic solution INSTILL ONE DROP INTO BOTH EYES TWICE DAILY   . ferrous sulfate 325 (65 FE) MG tablet TAKE 1 TABLET BY MOUTH EVERY DAY WITH BREAKFAST   . gabapentin (NEURONTIN) 300 MG capsule TAKE 2 CAPSULES (600 MG TOTAL) BY MOUTH 3 (THREE) TIMES DAILY.   [name] Kitchen ibuprofen (ADVIL) 800 MG tablet Take 1 tablet (800 mg total) by mouth every 8 (eight) hours as needed.   . insulin glargine (LANTUS SOLOSTAR) 100 UNIT/ML Solostar Pen Inject 20 Units into the skin at bedtime.   . insulin lispro (HUMALOG) 100 UNIT/ML KwikPen Inject 0.05 mLs (5 Units total) into the skin 3 (three) times daily with meals.   . Insulin Pen Needle (PEN NEEDLES) 32G X 5 MM MISC Use four times  daily for insulin   . latanoprost (XALATAN) 0.005 % ophthalmic solution Place 1 drop into both eyes at bedtime.    . lidocaine-prilocaine (EMLA) cream APPLY TO AFFECTED AREA ONCE AS DIRECTED   . loperamide (IMODIUM A-D) 2 MG tablet Take 1 tablet (2 mg total) by mouth See admin instructions. Take 2 at diarrhea onset , then 1 every 2hr until 12hrs with no BM. May take 2 every 4hrs at night. If diarrhea recurs repeat.   . Multiple Vitamin (MULTIVITAMIN WITH MINERALS) TABS tablet Take 1 tablet by mouth daily.   . OMEGA-3 FATTY ACIDS PO Take 1,200 mg by mouth daily.    [name] Kitchen omeprazole (PRILOSEC) 40 MG capsule TAKE 1 CAPSULE BY MOUTH EVERY DAY   . ONETOUCH VERIO test strip TEST FASTING SUGAR EVERY MORNING   . oxyCODONE (OXY IR/ROXICODONE) 5 MG immediate release tablet Take 1 tablet (5 mg total) by mouth every 6 (six) hours as needed for severe pain.   . [DISCONTINUED] potassium chloride (KLOR-CON) 10 MEQ tablet Take 1 tablet (10 mEq total) by mouth daily. (Patient not taking: Reported on 02/27/2020)    Facility-Administered Encounter Medications as of 03/04/2020  Medication  . sodium chloride flush (NS) 0.9 % injection 10 mL  . sodium chloride flush (NS) 0.9 % injection 10 mL       Goals Addressed            This Visit's Progress   . Chronic Disease  Management       CARE PLAN ENTRY (see longitudinal plan of care for additional care plan information)  Current Barriers:  . Chronic Disease Management support and education needs related to Hypertension, Coronary Artery Disease, Diabetes, GERD and pancreatic cancer.  Nurse Case Manager Clinical Goal(s):  [name] Kitchen Over the next 90 days, patient will not experience unplanned hospital admission. . Over the next 90 days, patient will take all medications as prescribed. . Over the next 90 days, patient will attend all scheduled medical appointments. . Over the next 90 days, patient will follow provider recommendations regarding blood glucose  monitoring. . Over the next 30 days, patient will obtain prescribed Freestyle Libre continuous glucose monitoring device. . Over the next 90 days, patient will work with care management team and provide updates regarding goals of care and changes in health needs.   Interventions:  . Inter-disciplinary care team collaboration (see longitudinal plan of care) . Reviewed medications. Reports self-administering medications as prescribed. Denies concerns regarding prescription costs. . Reviewed blood glucose parameters along with recommended interventions. Discussed  recent readings. Reports most fasting readings have ranged between 120-130's. He noted a few high readings of 165mg /dl and 209 mg/dl. Confirmed receipt of Freestyle Libre device. Requested assistance with obtaining supply refills. . Reviewed pending appointments. Continues to attend appointments as scheduled. No concerns regarding transportation. . Discussed plans for ongoing care management and follow up. Provided direct contact information for care management team. Reports good family support. Declines current need for additional assistance in the home.   Patient Self Care Activities:  . Self administers medications . Attends scheduled provider appointments . Calls pharmacy for medication refills . Attends church or other social activities . Performs ADL's independently . Performs IADL's independently . Calls provider office for new concerns or questions   Please see past updates related to this goal by clicking on the "Past Updates" button in the selected goal          PLAN Will follow-up regarding refills for [facility] and contact Craig [name] again within the three weeks.   [name] [facility] Care Management [phone]

## 2020-03-19 ENCOUNTER — Inpatient Hospital Stay: Payer: Medicare Other

## 2020-03-19 ENCOUNTER — Other Ambulatory Visit: Payer: Self-pay

## 2020-03-19 ENCOUNTER — Other Ambulatory Visit: Payer: Self-pay | Admitting: Oncology

## 2020-03-19 VITALS — BP 144/63 | HR 71 | Temp 98.0°F | Resp 18

## 2020-03-19 DIAGNOSIS — Z5111 Encounter for antineoplastic chemotherapy: Secondary | ICD-10-CM | POA: Diagnosis not present

## 2020-03-19 DIAGNOSIS — C259 Malignant neoplasm of pancreas, unspecified: Secondary | ICD-10-CM

## 2020-03-19 MED ORDER — SODIUM CHLORIDE 0.9 % IV SOLN
800.0000 mg/m2/d | INTRAVENOUS | Status: DC
  Administered 2020-03-19: 1500 mg via INTRAVENOUS
  Filled 2020-03-19: qty 30

## 2020-03-19 MED ORDER — HEPARIN SOD (PORK) LOCK FLUSH 100 UNIT/ML IV SOLN
500.0000 [IU] | Freq: Once | INTRAVENOUS | Status: DC | PRN
  Filled 2020-03-19: qty 5

## 2020-03-19 MED ORDER — SODIUM CHLORIDE 0.9% FLUSH
10.0000 mL | INTRAVENOUS | Status: DC | PRN
  Filled 2020-03-19: qty 10

## 2020-03-19 MED ORDER — HEPARIN SOD (PORK) LOCK FLUSH 100 UNIT/ML IV SOLN
INTRAVENOUS | Status: AC
  Filled 2020-03-19: qty 5

## 2020-03-19 NOTE — Progress Notes (Signed)
Pt returned to clinic for pump disconnect. Needle was already removed from port a cath pt reports that "it came out yesterday evening at something til 5, while I was working in my garden" . Pump was stopped by patient at the time that he discovered it had disconnected, and skin wash with soap and water per pt. Pump reads that a total of 52.6 ml remains in bag. Dr. Tasia Catchings and Elmon Else in pharmacy aware. Per Dr. Tasia Catchings a new bag was made and started to run over 24 hrs via home infusion pump. Pt agrees with plan.

## 2020-03-19 NOTE — Progress Notes (Signed)
Patient reported to clinic for pump d/c. Port needle already removed. Pt states it came out yesterday at "something to five" while workinf in the garden. He had stopped the pump once he noted that it was disconnected. and pump is reading 52.57ml remaining of medication. I discussed with pharmacist Anderson Malta. Estimate of remaining 5-FU is about 1500mg . I ordered a new bag of 5-FU 1500mg  over 24 hours. Patient comes tomorrow to dc pump.

## 2020-03-19 NOTE — Progress Notes (Signed)
Patient returned today for d/c pump.  Needle had disconnected and ~52.49ml remains in bag.  (Bag was exposed to air/germs so threw away). Remade bag today for ~1500mg  remaining to be given over 24 hours

## 2020-03-20 ENCOUNTER — Inpatient Hospital Stay: Payer: Medicare Other

## 2020-03-20 ENCOUNTER — Telehealth: Payer: Self-pay

## 2020-03-20 VITALS — BP 144/70 | HR 73 | Temp 98.2°F | Resp 18

## 2020-03-20 DIAGNOSIS — Z5111 Encounter for antineoplastic chemotherapy: Secondary | ICD-10-CM | POA: Diagnosis not present

## 2020-03-20 DIAGNOSIS — Z95828 Presence of other vascular implants and grafts: Secondary | ICD-10-CM

## 2020-03-20 MED ORDER — HEPARIN SOD (PORK) LOCK FLUSH 100 UNIT/ML IV SOLN
500.0000 [IU] | Freq: Once | INTRAVENOUS | Status: AC
  Administered 2020-03-20: 500 [IU] via INTRAVENOUS
  Filled 2020-03-20: qty 5

## 2020-03-20 MED ORDER — SODIUM CHLORIDE 0.9% FLUSH
10.0000 mL | INTRAVENOUS | Status: DC | PRN
  Administered 2020-03-20: 10 mL via INTRAVENOUS
  Filled 2020-03-20: qty 10

## 2020-03-27 ENCOUNTER — Ambulatory Visit: Payer: Self-pay

## 2020-03-27 NOTE — Chronic Care Management (AMB) (Signed)
  Chronic Care Management   Note  03/27/2020 Name: Hersey Maclellan MRN: [id number] DOB: 09/11/1946    Brief outreach with Mr. Norah. Reports doing well but running errands at the time of the call. Agreeable to completing outreach in the clinic to review treatment plans and provider instructions. Anticipates being available on July 15th.    Follow up plan: The care management team will evaluate Mr. Staller in the clinic on 04/24/20.     [name] [facility] Care Management [phone]

## 2020-03-31 ENCOUNTER — Other Ambulatory Visit: Payer: Self-pay

## 2020-03-31 ENCOUNTER — Encounter: Payer: Self-pay | Admitting: Oncology

## 2020-03-31 ENCOUNTER — Inpatient Hospital Stay (HOSPITAL_BASED_OUTPATIENT_CLINIC_OR_DEPARTMENT_OTHER): Payer: Medicare Other | Admitting: Oncology

## 2020-03-31 ENCOUNTER — Inpatient Hospital Stay: Payer: Medicare Other

## 2020-03-31 VITALS — BP 144/76 | HR 76 | Temp 96.4°F | Resp 18 | Wt 173.3 lb

## 2020-03-31 DIAGNOSIS — D6481 Anemia due to antineoplastic chemotherapy: Secondary | ICD-10-CM | POA: Diagnosis not present

## 2020-03-31 DIAGNOSIS — T451X5A Adverse effect of antineoplastic and immunosuppressive drugs, initial encounter: Secondary | ICD-10-CM

## 2020-03-31 DIAGNOSIS — Z5111 Encounter for antineoplastic chemotherapy: Secondary | ICD-10-CM | POA: Diagnosis not present

## 2020-03-31 DIAGNOSIS — C787 Secondary malignant neoplasm of liver and intrahepatic bile duct: Secondary | ICD-10-CM

## 2020-03-31 DIAGNOSIS — C259 Malignant neoplasm of pancreas, unspecified: Secondary | ICD-10-CM

## 2020-03-31 LAB — CBC WITH DIFFERENTIAL/PLATELET
Abs Immature Granulocytes: 0.03 10*3/uL (ref 0.00–0.07)
Basophils Absolute: 0.1 10*3/uL (ref 0.0–0.1)
Basophils Relative: 1 %
Eosinophils Absolute: 0.2 10*3/uL (ref 0.0–0.5)
Eosinophils Relative: 4 %
HCT: 33.3 % — ABNORMAL LOW (ref 39.0–52.0)
Hemoglobin: 10.5 g/dL — ABNORMAL LOW (ref 13.0–17.0)
Immature Granulocytes: 1 %
Lymphocytes Relative: 17 %
Lymphs Abs: 0.9 10*3/uL (ref 0.7–4.0)
MCH: 29.7 pg (ref 26.0–34.0)
MCHC: 31.5 g/dL (ref 30.0–36.0)
MCV: 94.1 fL (ref 80.0–100.0)
Monocytes Absolute: 0.6 10*3/uL (ref 0.1–1.0)
Monocytes Relative: 10 %
Neutro Abs: 3.6 10*3/uL (ref 1.7–7.7)
Neutrophils Relative %: 67 %
Platelets: 185 10*3/uL (ref 150–400)
RBC: 3.54 MIL/uL — ABNORMAL LOW (ref 4.22–5.81)
RDW: 16.7 % — ABNORMAL HIGH (ref 11.5–15.5)
WBC: 5.4 10*3/uL (ref 4.0–10.5)
nRBC: 0 % (ref 0.0–0.2)

## 2020-03-31 LAB — COMPREHENSIVE METABOLIC PANEL
ALT: 16 U/L (ref 0–44)
AST: 16 U/L (ref 15–41)
Albumin: 3.7 g/dL (ref 3.5–5.0)
Alkaline Phosphatase: 62 U/L (ref 38–126)
Anion gap: 9 (ref 5–15)
BUN: 18 mg/dL (ref 8–23)
CO2: 23 mmol/L (ref 22–32)
Calcium: 9 mg/dL (ref 8.9–10.3)
Chloride: 108 mmol/L (ref 98–111)
Creatinine, Ser: 1.1 mg/dL (ref 0.61–1.24)
GFR calc Af Amer: >60 mL/min (ref >60)
GFR calc non Af Amer: >60 mL/min (ref >60)
Glucose, Bld: 132 mg/dL — ABNORMAL HIGH (ref 70–99)
Potassium: 4 mmol/L (ref 3.5–5.1)
Sodium: 140 mmol/L (ref 135–145)
Total Bilirubin: 0.6 mg/dL (ref 0.3–1.2)
Total Protein: 6.6 g/dL (ref 6.5–8.1)

## 2020-03-31 MED ORDER — SODIUM CHLORIDE 0.9% FLUSH
10.0000 mL | Freq: Once | INTRAVENOUS | Status: AC
  Administered 2020-03-31: 10 mL via INTRAVENOUS
  Filled 2020-03-31: qty 10

## 2020-03-31 MED ORDER — SODIUM CHLORIDE 0.9% FLUSH
10.0000 mL | INTRAVENOUS | Status: DC | PRN
  Administered 2020-03-31: 10 mL
  Filled 2020-03-31: qty 10

## 2020-03-31 MED ORDER — SODIUM CHLORIDE 0.9 % IV SOLN
10.0000 mg | Freq: Once | INTRAVENOUS | Status: AC
  Administered 2020-03-31: 10 mg via INTRAVENOUS
  Filled 2020-03-31: qty 10

## 2020-03-31 MED ORDER — SODIUM CHLORIDE 0.9 % IV SOLN
421.0000 mg/m2 | Freq: Once | INTRAVENOUS | Status: AC
  Administered 2020-03-31: 800 mg via INTRAVENOUS
  Filled 2020-03-31: qty 25

## 2020-03-31 MED ORDER — SODIUM CHLORIDE 0.9 % IV SOLN
68.0000 mg/m2 | Freq: Once | INTRAVENOUS | Status: AC
  Administered 2020-03-31: 129 mg via INTRAVENOUS
  Filled 2020-03-31: qty 30

## 2020-03-31 MED ORDER — HEPARIN SOD (PORK) LOCK FLUSH 100 UNIT/ML IV SOLN
500.0000 [IU] | Freq: Once | INTRAVENOUS | Status: DC
  Filled 2020-03-31: qty 5

## 2020-03-31 MED ORDER — PALONOSETRON HCL INJECTION 0.25 MG/5ML
0.2500 mg | Freq: Once | INTRAVENOUS | Status: AC
  Administered 2020-03-31: 0.25 mg via INTRAVENOUS
  Filled 2020-03-31: qty 5

## 2020-03-31 MED ORDER — SODIUM CHLORIDE 0.9 % IV SOLN
Freq: Once | INTRAVENOUS | Status: AC
  Filled 2020-03-31: qty 250

## 2020-03-31 MED ORDER — SODIUM CHLORIDE 0.9 % IV SOLN
2400.0000 mg/m2 | INTRAVENOUS | Status: DC
  Administered 2020-03-31: 4550 mg via INTRAVENOUS
  Filled 2020-03-31: qty 91

## 2020-03-31 NOTE — Progress Notes (Signed)
Patient denies new problems/concerns today.   °

## 2020-03-31 NOTE — Progress Notes (Signed)
Hematology/Oncology  Follow up note [facility] [phone] [phone]   Patient Care Team: Paulene Floor as PCP - General (Physician Assistant) Clent Jacks, RN as Oncology Nurse Navigator Earlie Server, MD as Consulting Physician (Hematology and Oncology) Neldon Labella, RN as Case Manager Lonia Farber, MD as Consulting Physician (Internal Medicine)  REFERRING PROVIDER: Trinna Post, PA-C  CHIEF COMPLAINTS/REASON FOR VISIT:  Follow up for treatment of pancreatic cancer  HISTORY OF PRESENTING ILLNESS:   Craig [name] is a  74 y.o.  male with PMH listed below, including CABG x5, PVD, hypertension, former tobacco abuse, former alcohol abuse, iron deficiency anemia, was seen in consultation at the request of  Terrilee Croak, Adriana M, PA-C  for evaluation of abnormal CT Patient recently presented to primary care provider complaining for abdominal pain radiating to his back for about 10 days.  Patient uses Tylenol as needed for pain. Work-up showed elevated amylase, lipase, mild anemia with hemoglobin of 11.3 Acute hepatitis panel negative. 05/16/2019 CT abdomen pelvis with contrast showed poorly marginated heterogeneous hypodense 2.9 cm pancreatic mass at the uncinate process, worrisome for primary pancreatic cancer.  No biliary or pancreatic duct dilation. Several ill defined hypodense liver masses scattered throughout the liver, largest 3.1 cm in the segment 6 right liver lobe. Nonspecific portacaval and aortocaval adenopathy. Extreme medial left lung base 4.4 cm pleural-based mass probably a pleural metastasis.  Additional tiny pulmonary nodules scattered at the right lung base are indeterminate. Mild prostate or megaly  He had a history of 37.5-pack-year smoking history quitting 2018. No longer drinking alcohol.  # ultrasound-guided liver biopsy on 05/21/2019.  Pathology showed fragments of benign hepatic parenchyma showing  minimal macro vascular steatosis, otherwise no significant histo pathologic change. Single core of renal tissue showing approximately 25% glomerulosclerosis  # #History of CAD, status post CABG x5.  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  # Omniseq test showed PD-L1 50% TPS, TMB high, negative for BRCA1/2 or NTRK fusion.  MSI could not be completed.  Patient declines genetic testing  #  third line chemotherapy treatment 5-FU with liposomal irinotecan 01/24/2020-01/26/2020 patient was admitted to ICU due to DKA. on long-acting insulin 20 units at night and takes sliding scale with meals.  INTERVAL HISTORY Craig [name] is a 74 y.o. male who has above history reviewed by me today presents for evaluation of chemotherapy tolerability for metastatic pancreatic cancer.   Problems and complaints are listed below: Patient is on third line chemotherapy treatments.   Patient reports feeling well.  He has no new complaints. He noticed a few black spots on his tongue.  Denies any pain or mouth sores. .. Review of Systems  Constitutional: Positive for fatigue. Negative for appetite change, chills, fever and unexpected weight change.  HENT:   Negative for hearing loss and voice change.   Eyes: Negative for eye problems and icterus.  Respiratory: Negative for chest tightness, cough and shortness of breath.   Cardiovascular: Negative for chest pain and leg swelling.  Gastrointestinal: Negative for abdominal distention and abdominal pain.  Endocrine: Negative for hot flashes.  Genitourinary: Negative for difficulty urinating, dysuria and frequency.   Musculoskeletal: Negative for arthralgias.  Skin: Negative for itching and rash.  Neurological: Positive for numbness. Negative for light-headedness.  Hematological: Negative for adenopathy. Does not bruise/bleed easily.   Psychiatric/Behavioral: Negative for confusion.  MEDICAL HISTORY:  Past Medical History:  Diagnosis Date  . Allergic rhinitis   . Cancer [facility])    new dx Pancreatic Cancer - Aug 2020  . CHF (congestive heart failure) ([place])   . Chronic cholecystitis   . Coronary artery disease   . DDD (degenerative disc disease), cervical   . Dehydration 06/21/2019  . Diabetes mellitus without complication ([place])   . Dyspnea   . Early cataract   . Erectile dysfunction   . GERD (gastroesophageal reflux disease)   . Gouty arthritis   . Headache    occasional migraines  . Hypercalcemia   . Hyperlipemia   . Hypertension   . Palpitations   . Peripheral vascular disease ([place])   . 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   LIMA to LAD SVG to [place] SVG SEQUENTIALLY to OM1 and OM2 SVG to ACUTE MARGINAL  . Spinal stenosis of cervical region   . Vitamin D deficiency     SURGICAL HISTORY: Past Surgical History:  Procedure Laterality Date  . BACK SURGERY  2018    fusion with screws  . CHOLECYSTECTOMY N/A 11/13/2018   Procedure: LAPAROSCOPIC CHOLECYSTECTOMY;  Surgeon: Vickie Epley, MD;  Location: [facility] ORS;  Service: General;  Laterality: N/A;  . COLONOSCOPY    . CORONARY ARTERY BYPASS GRAFT N/A 09/30/2017   Procedure: CORONARY ARTERY BYPASS GRAFTING times five using right and left Saphaneous vein harvested endoscopicly  and left internal mammary artery. (CABG),TEE;  Surgeon: Rexene Alberts, MD;  Location: [place];  Service: Open Heart Surgery;  Laterality: N/A;  . LEFT HEART CATH AND CORONARY ANGIOGRAPHY Left 09/06/2017   Procedure: LEFT HEART CATH AND CORONARY ANGIOGRAPHY;  Surgeon: Corey Skains, MD;  Location: [place] CV LAB;  Service: Cardiovascular;  Laterality: Left;  . percutaneous transluminal balloon angioplasty  01/2010   of left lower extremity  . PORTA CATH INSERTION N/A 06/06/2019   Procedure: PORTA CATH INSERTION;  Surgeon: Katha Cabal, MD;  Location: [place] CV  LAB;  Service: Cardiovascular;  Laterality: N/A;  . TEE WITHOUT CARDIOVERSION N/A 09/30/2017   Procedure: TRANSESOPHAGEAL ECHOCARDIOGRAM (TEE);  Surgeon: Rexene Alberts, MD;  Location: [place];  Service: Open Heart Surgery;  Laterality: N/A;  . VASCULAR SURGERY Left 2010   left exernal iliac and superficial femoral artery PTCA and stenting    SOCIAL HISTORY: Social History   Socioeconomic History  . Marital status: Married    Spouse name: Enid Derry  . Number of children: 2  . Years of education: Not on file  . Highest education level: 5th grade  Occupational History  . Occupation: drove tractors    Comment: retired  Tobacco Use  . Smoking status: Former Smoker    Packs/day: 0.75    Years: 50.00    Pack years: 37.50    Types: Cigarettes    Quit date: 09/27/2017    Years since quitting: 2.5  . Smokeless tobacco: Former Systems developer    Types: Secondary school teacher  . Vaping Use: Never used  Substance and Sexual Activity  . Alcohol use: Not Currently    Comment: stopped drinking before cabg  . Drug use: Not Currently    Types: Marijuana    Comment: stopped smoking before CABG  . Sexual activity: Not Currently  Other Topics Concern  . Not on file  Social History Narrative  . Not on file   Social Determinants of Health   Financial Resource Strain: Low Risk   . Difficulty of  Paying Living Expenses: Not hard at all  Food Insecurity: No Food Insecurity  . Worried About Charity fundraiser in the Last Year: Never true  . Ran Out of Food in the Last Year: Never true  Transportation Needs: No Transportation Needs  . Lack of Transportation (Medical): No  . Lack of Transportation (Non-Medical): No  Physical Activity: Inactive  . Days of Exercise per Week: 0 days  . Minutes of Exercise per Session: 0 min  Stress: No Stress Concern Present  . Feeling of Stress : Not at all  Social Connections: Moderately Isolated  . Frequency of Communication with Friends and Family: More than three times  a week  . Frequency of Social Gatherings with Friends and Family: More than three times a week  . Attends Religious Services: Never  . Active Member of Clubs or Organizations: No  . Attends Archivist Meetings: Never  . Marital Status: Married  Human resources officer Violence: Not At Risk  . Fear of Current or Ex-Partner: No  . Emotionally Abused: No  . Physically Abused: No  . Sexually Abused: No    FAMILY HISTORY: Family History  Problem Relation Age of Onset  . Brain cancer Mother   . Emphysema Father   . Cancer Brother     ALLERGIES:  is allergic to lipitor [atorvastatin], zetia [ezetimibe], lisinopril, protonix [pantoprazole], and spironolactone.  MEDICATIONS:  Current Outpatient Medications  Medication Sig Dispense Refill  . aspirin EC 81 MG tablet Take 81 mg by mouth daily.    . carvedilol (COREG) 6.25 MG tablet Take 6.25 mg by mouth 2 (two) times daily with a meal.  3  . Continuous Blood Gluc Receiver (FREESTYLE LIBRE 14 DAY READER) DEVI 1 Device by Does not apply route every 14 (fourteen) days. 6 each 1  . diltiazem (CARDIZEM CD) 300 MG 24 hr capsule TAKE 1 CAPSULE BY MOUTH EVERY DAY 90 capsule 1  . dorzolamide-timolol (COSOPT) 22.3-6.8 MG/ML ophthalmic solution INSTILL ONE DROP INTO BOTH EYES TWICE DAILY    . ferrous sulfate 325 (65 FE) MG tablet TAKE 1 TABLET BY MOUTH EVERY DAY WITH BREAKFAST 90 tablet 1  . gabapentin (NEURONTIN) 300 MG capsule TAKE 2 CAPSULES (600 MG TOTAL) BY MOUTH 3 (THREE) TIMES DAILY. 540 capsule 0  . ibuprofen (ADVIL) 800 MG tablet Take 1 tablet (800 mg total) by mouth every 8 (eight) hours as needed. 30 tablet 0  . insulin glargine (LANTUS SOLOSTAR) 100 UNIT/ML Solostar Pen Inject 20 Units into the skin at bedtime. 15 mL 0  . insulin lispro (HUMALOG) 100 UNIT/ML KwikPen Inject 0.05 mLs (5 Units total) into the skin 3 (three) times daily with meals. 15 mL 11  . Insulin Pen Needle (PEN NEEDLES) 32G X 5 MM MISC Use four times daily for insulin  400 each 1  . latanoprost (XALATAN) 0.005 % ophthalmic solution Place 1 drop into both eyes at bedtime.   3  . lidocaine-prilocaine (EMLA) cream APPLY TO AFFECTED AREA ONCE AS DIRECTED    . loperamide (IMODIUM A-D) 2 MG tablet Take 1 tablet (2 mg total) by mouth See admin instructions. Take 2 at diarrhea onset , then 1 every 2hr until 12hrs with no BM. May take 2 every 4hrs at night. If diarrhea recurs repeat. 100 tablet 1  . Multiple Vitamin (MULTIVITAMIN WITH MINERALS) TABS tablet Take 1 tablet by mouth daily.    . OMEGA-3 FATTY ACIDS PO Take 1,200 mg by mouth daily.     [name] Kitchen omeprazole (  PRILOSEC) 40 MG capsule TAKE 1 CAPSULE BY MOUTH EVERY DAY 90 capsule 1  . ONETOUCH VERIO test strip TEST FASTING SUGAR EVERY MORNING 100 strip 3  . oxyCODONE (OXY IR/ROXICODONE) 5 MG immediate release tablet Take 1 tablet (5 mg total) by mouth every 6 (six) hours as needed for severe pain. 30 tablet 0  . potassium chloride (KLOR-CON) 10 MEQ tablet TAKE 1 TABLET BY MOUTH EVERY DAY 30 tablet 0   No current facility-administered medications for this visit.   Facility-Administered Medications Ordered in Other Visits  Medication Dose Route Frequency Provider Last Rate Last Admin  . heparin lock flush 100 unit/mL  500 Units Intravenous Once Earlie Server, MD      . sodium chloride flush (NS) 0.9 % injection 10 mL  10 mL Intracatheter PRN Earlie Server, MD      . sodium chloride flush (NS) 0.9 % injection 10 mL  10 mL Intravenous Once Earlie Server, MD         PHYSICAL EXAMINATION: ECOG PERFORMANCE STATUS: 1 - Symptomatic but completely ambulatory Vitals:   03/31/20 0832  BP: (!) 144/76  Pulse: 76  Resp: 18  Temp: (!) 96.4 F (35.8 C)   Filed Weights   03/31/20 0832  Weight: 173 lb 4.8 oz (78.6 kg)    Physical Exam Constitutional:      General: He is not in acute distress. HENT:     Head: Normocephalic and atraumatic.  Eyes:     General: No scleral icterus. Cardiovascular:     Rate and Rhythm: Normal rate and  regular rhythm.     Heart sounds: Normal heart sounds.  Pulmonary:     Effort: Pulmonary effort is normal. No respiratory distress.     Breath sounds: No wheezing.  Abdominal:     General: Bowel sounds are normal. There is no distension.     Palpations: Abdomen is soft.  Musculoskeletal:        General: No deformity. Normal range of motion.     Cervical back: Normal range of motion and neck supple.  Skin:    General: Skin is warm and dry.     Findings: No erythema or rash.  Neurological:     Mental Status: He is alert and oriented to person, place, and time. Mental status is at baseline.     Cranial Nerves: No cranial nerve deficit.     Coordination: Coordination normal.  Psychiatric:        Mood and Affect: Mood normal.     LABORATORY DATA:  I have reviewed the data as listed Lab Results  Component Value Date   WBC 4.8 03/17/2020   HGB 10.2 (L) 03/17/2020   HCT 32.3 (L) 03/17/2020   MCV 93.1 03/17/2020   PLT 183 03/17/2020   Recent Labs    02/18/20 0820 03/03/20 0818 03/17/20 0806  NA 140 141 142  K 3.4* 4.0 4.0  CL 106 109 108  CO2 '24 24 25  '$ GLUCOSE 161* 121* 145*  BUN '15 23 20  '$ CREATININE 0.90 0.85 0.91  CALCIUM 8.4* 8.4* 8.7*  GFRNONAA >60 >60 >60  GFRAA >60 >60 >60  PROT 6.4* 6.3* 6.3*  ALBUMIN 3.3* 3.4* 3.5  AST '28 21 19  '$ ALT 36 25 21  ALKPHOS 86 73 64  BILITOT 0.7 0.6 0.7   Iron/TIBC/Ferritin/ %Sat    Component Value Date/Time   IRON 119 04/27/2019 0940   TIBC 363 04/27/2019 0940   FERRITIN 58 04/27/2019 0940   IRONPCTSAT  33 04/27/2019 0940      RADIOGRAPHIC STUDIES: I have personally reviewed the radiological images as listed and agreed with the findings in the report. CT CHEST W CONTRAST  Result Date: 01/02/2020 CLINICAL DATA:  74 year old male with history of pancreatic cancer with liver metastasis. Elevated PSA. EXAM: CT CHEST, ABDOMEN, AND PELVIS WITH CONTRAST TECHNIQUE: Multidetector CT imaging of the chest, abdomen and pelvis was  performed following the standard protocol during bolus administration of intravenous contrast. CONTRAST:  173m OMNIPAQUE IOHEXOL 300 MG/ML  SOLN COMPARISON:  CT of the chest abdomen pelvis dated 11/05/2019. FINDINGS: CT CHEST FINDINGS Cardiovascular: There is no cardiomegaly or pericardial effusion. Three-vessel coronary vascular calcification and postsurgical changes of CABG. There is advanced atherosclerotic calcification of the thoracic aorta. No aneurysmal dilatation or dissection. The origins of the great vessels of the aortic arch appear patent as visualized. The central pulmonary arteries appear patent for the degree of opacification. Mediastinum/Nodes: No hilar or mediastinal adenopathy. The esophagus is grossly unremarkable. Several subcentimeter hypodense thyroid nodules. No followup recommended (Ref: J Am Coll Radiol. 2015 Feb;12(2): 143-50). No mediastinal fluid collection. Lungs/Pleura: There is a 4.2 x 3.0 cm ovoid soft tissue mass involving the medial left lung base similar to prior CT. 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 Several small subpleural nodules primarily in the right lung. A 3 mm right upper lobe nodule (series 5, image 64) similar or minimally increased in size. There is a 5 mm subpleural nodule in the right lower lobe (series 5, image 99) new since the prior CT. Additional 5 mm right lung base subpleural nodule (series 5, image 14) also appears new since the prior CT. There is an 8 mm nodule at the left lung base (series 5, image 111) which has significantly increased in size since the prior CT. A 4 mm left lower lobe nodule (series 5, image 103) is new since the prior CT. These nodules are concerning for metastatic disease. There is no focal consolidation, pleural effusion, pneumothorax. The central airways are patent. Musculoskeletal: Median sternotomy wires. Right pectoral Port-A-Cath with tip at the cavoatrial junction. There is osteopenia  with degenerative changes of the spine. No acute osseous pathology. No definite suspicious osseous lesions. CT ABDOMEN PELVIS FINDINGS No intra-abdominal free air or free fluid. Hepatobiliary: There is a background of fatty infiltration of the liver. Interval increase in the size and number of hepatic hypodense lesions consistent with progression of metastatic disease. The largest lesion measures approximately 3.4 x 2.8 cm in the right lobe of the liver (previously 2.0 x 1.1 cm). No intrahepatic biliary ductal dilatation. Cholecystectomy. Pancreas: There is a 4.0 x 3.0 cm (previously 3.5 x 2.8 cm) soft tissue lesion in the region of the uncinate process of the pancreas (series 3, image 64). There is mild diffuse pancreatic atrophy and dilatation of the main pancreatic duct measuring up to 5 mm in diameter and progressed since the prior CT. There is mild haziness of the peripancreatic fat, likely reactive. Spleen: Normal in size without focal abnormality. Adrenals/Urinary Tract: The adrenal glands are unremarkable. There is no hydronephrosis on either side. Bilateral renal cysts measuring up to 4.3 cm in the inferior pole of the right kidney. There is symmetric enhancement and excretion of contrast by both kidneys. The visualized ureters and urinary bladder appear unremarkable. Stomach/Bowel: There is moderate stool throughout the colon. No bowel obstruction or active inflammation. The appendix is normal. Vascular/Lymphatic: Advanced aortoiliac atherosclerotic disease. The IVC is  grossly unremarkable. Probable mild narrowing of the porta splenic confluence secondary to pancreatic head mass. The SMV, splenic vein, and main portal vein are patent. No portal venous gas. Mildly enlarged pancreatico caval lymph node measures 15 mm (series 3, image 59). Reproductive: Mildly enlarged prostate gland. The seminal vesicles are symmetric. Other: None Musculoskeletal: Osteopenia with degenerative changes of the spine. L4-L5  posterior fusion and disc spacer. No acute osseous pathology. IMPRESSION: 1. Overall progression of hepatic and pulmonary metastatic disease since the prior CT of 11/05/2019. 2. Interval increase in the size of the pancreatic head mass and pancreatic duct dilatation. 3. A 15 mm peripancreatic lymph node, increased in size since the prior CT. 4. Ovoid soft tissue mass in the medial left lung base, similar to prior CT. 5. 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 6. No bowel obstruction. Normal appendix. 7. Aortic Atherosclerosis (ICD10-I70.0). Electronically Signed   By: Anner Crete M.D.   On: 01/02/2020 18:49   CT ABDOMEN PELVIS W CONTRAST  Result Date: 01/02/2020 CLINICAL DATA:  74 year old male with history of pancreatic cancer with liver metastasis. Elevated PSA. EXAM: CT CHEST, ABDOMEN, AND PELVIS WITH CONTRAST TECHNIQUE: Multidetector CT imaging of the chest, abdomen and pelvis was performed following the standard protocol during bolus administration of intravenous contrast. CONTRAST:  136m OMNIPAQUE IOHEXOL 300 MG/ML  SOLN COMPARISON:  CT of the chest abdomen pelvis dated 11/05/2019. FINDINGS: CT CHEST FINDINGS Cardiovascular: There is no cardiomegaly or pericardial effusion. Three-vessel coronary vascular calcification and postsurgical changes of CABG. There is advanced atherosclerotic calcification of the thoracic aorta. No aneurysmal dilatation or dissection. The origins of the great vessels of the aortic arch appear patent as visualized. The central pulmonary arteries appear patent for the degree of opacification. Mediastinum/Nodes: No hilar or mediastinal adenopathy. The esophagus is grossly unremarkable. Several subcentimeter hypodense thyroid nodules. No followup recommended (Ref: J Am Coll Radiol. 2015 Feb;12(2): 143-50). No mediastinal fluid collection. Lungs/Pleura: There is a 4.2 x 3.0 cm ovoid soft tissue mass involving the medial left lung base similar to prior  CT. 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 Several small subpleural nodules primarily in the right lung. A 3 mm right upper lobe nodule (series 5, image 64) similar or minimally increased in size. There is a 5 mm subpleural nodule in the right lower lobe (series 5, image 99) new since the prior CT. Additional 5 mm right lung base subpleural nodule (series 5, image 14) also appears new since the prior CT. There is an 8 mm nodule at the left lung base (series 5, image 111) which has significantly increased in size since the prior CT. A 4 mm left lower lobe nodule (series 5, image 103) is new since the prior CT. These nodules are concerning for metastatic disease. There is no focal consolidation, pleural effusion, pneumothorax. The central airways are patent. Musculoskeletal: Median sternotomy wires. Right pectoral Port-A-Cath with tip at the cavoatrial junction. There is osteopenia with degenerative changes of the spine. No acute osseous pathology. No definite suspicious osseous lesions. CT ABDOMEN PELVIS FINDINGS No intra-abdominal free air or free fluid. Hepatobiliary: There is a background of fatty infiltration of the liver. Interval increase in the size and number of hepatic hypodense lesions consistent with progression of metastatic disease. The largest lesion measures approximately 3.4 x 2.8 cm in the right lobe of the liver (previously 2.0 x 1.1 cm). No intrahepatic biliary ductal dilatation. Cholecystectomy. Pancreas: There is a 4.0 x 3.0  cm (previously 3.5 x 2.8 cm) soft tissue lesion in the region of the uncinate process of the pancreas (series 3, image 64). There is mild diffuse pancreatic atrophy and dilatation of the main pancreatic duct measuring up to 5 mm in diameter and progressed since the prior CT. There is mild haziness of the peripancreatic fat, likely reactive. Spleen: Normal in size without focal abnormality. Adrenals/Urinary Tract: The adrenal glands are  unremarkable. There is no hydronephrosis on either side. Bilateral renal cysts measuring up to 4.3 cm in the inferior pole of the right kidney. There is symmetric enhancement and excretion of contrast by both kidneys. The visualized ureters and urinary bladder appear unremarkable. Stomach/Bowel: There is moderate stool throughout the colon. No bowel obstruction or active inflammation. The appendix is normal. Vascular/Lymphatic: Advanced aortoiliac atherosclerotic disease. The IVC is grossly unremarkable. Probable mild narrowing of the porta splenic confluence secondary to pancreatic head mass. The SMV, splenic vein, and main portal vein are patent. No portal venous gas. Mildly enlarged pancreatico caval lymph node measures 15 mm (series 3, image 59). Reproductive: Mildly enlarged prostate gland. The seminal vesicles are symmetric. Other: None Musculoskeletal: Osteopenia with degenerative changes of the spine. L4-L5 posterior fusion and disc spacer. No acute osseous pathology. IMPRESSION: 1. Overall progression of hepatic and pulmonary metastatic disease since the prior CT of 11/05/2019. 2. Interval increase in the size of the pancreatic head mass and pancreatic duct dilatation. 3. A 15 mm peripancreatic lymph node, increased in size since the prior CT. 4. Ovoid soft tissue mass in the medial left lung base, similar to prior CT. 5. 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 6. No bowel obstruction. Normal appendix. 7. Aortic Atherosclerosis (ICD10-I70.0). Electronically Signed   By: Anner Crete M.D.   On: 01/02/2020 18:49      ASSESSMENT & PLAN:  1. Pancreatic cancer metastasized to liver ([place])   2. Encounter for antineoplastic chemotherapy   3. Anemia due to antineoplastic chemotherapy    #Metastatic pancreatic cancer- liver mets, hilar nodal metastasis, ?bone mets, CA 19-9 trending down to 10,000s.  Today's CA 19-9 is pending. Labs are reviewed and discussed with patient.    counts acceptable to proceed with 5-FU and liposomal irinotecan. Awaiting today's CA 19-9.  If his further declining, I will obtain CT scan after 8 cycles of treatments.  #Chronic hypokalemia, K has improved to 4.  Continue potassium chloride 10 mEq daily #  diabetes/ hyperglycemia, hyperglycemia is better controlled now on insulin regimen Follows up with primary care provider for management.  Glucose level is 132 today.  Stable.  #Normocytic anemia, likely secondary to chemo.  Monitor.  Hemoglobin stable at 10.5  #All questions were answered. The patient knows to call the clinic with any problems questions or concerns. Follow-up  2 weeks.   Earlie Server, MD, PhD Hematology Oncology [place] at [facility] 03/31/2020

## 2020-04-01 LAB — CANCER ANTIGEN 19-9: CA 19-9: 6679 U/mL — ABNORMAL HIGH (ref 0–35)

## 2020-04-02 ENCOUNTER — Inpatient Hospital Stay: Payer: Medicare Other

## 2020-04-02 ENCOUNTER — Other Ambulatory Visit: Payer: Self-pay

## 2020-04-02 DIAGNOSIS — C787 Secondary malignant neoplasm of liver and intrahepatic bile duct: Secondary | ICD-10-CM

## 2020-04-02 DIAGNOSIS — Z5111 Encounter for antineoplastic chemotherapy: Secondary | ICD-10-CM | POA: Diagnosis not present

## 2020-04-02 DIAGNOSIS — C259 Malignant neoplasm of pancreas, unspecified: Secondary | ICD-10-CM

## 2020-04-02 MED ORDER — HEPARIN SOD (PORK) LOCK FLUSH 100 UNIT/ML IV SOLN
INTRAVENOUS | Status: AC
  Filled 2020-04-02: qty 5

## 2020-04-02 MED ORDER — SODIUM CHLORIDE 0.9% FLUSH
10.0000 mL | INTRAVENOUS | Status: DC | PRN
  Administered 2020-04-02: 10 mL
  Filled 2020-04-02: qty 10

## 2020-04-02 MED ORDER — HEPARIN SOD (PORK) LOCK FLUSH 100 UNIT/ML IV SOLN
500.0000 [IU] | Freq: Once | INTRAVENOUS | Status: AC | PRN
  Administered 2020-04-02: 500 [IU]
  Filled 2020-04-02: qty 5

## 2020-04-10 ENCOUNTER — Other Ambulatory Visit: Payer: Self-pay | Admitting: Oncology

## 2020-04-10 NOTE — Telephone Encounter (Signed)
   Ref Range & Units 10 d ago  Potassium 3.5 - 5.1 mmol/L 4.0

## 2020-04-15 ENCOUNTER — Inpatient Hospital Stay (HOSPITAL_BASED_OUTPATIENT_CLINIC_OR_DEPARTMENT_OTHER): Payer: Medicare Other | Admitting: Oncology

## 2020-04-15 ENCOUNTER — Encounter: Payer: Self-pay | Admitting: Oncology

## 2020-04-15 ENCOUNTER — Inpatient Hospital Stay: Payer: Medicare Other | Attending: Oncology

## 2020-04-15 ENCOUNTER — Other Ambulatory Visit: Payer: Self-pay

## 2020-04-15 ENCOUNTER — Inpatient Hospital Stay: Payer: Medicare Other

## 2020-04-15 VITALS — BP 139/80 | HR 75 | Temp 96.0°F | Resp 18 | Wt 176.2 lb

## 2020-04-15 DIAGNOSIS — R200 Anesthesia of skin: Secondary | ICD-10-CM | POA: Diagnosis not present

## 2020-04-15 DIAGNOSIS — I10 Essential (primary) hypertension: Secondary | ICD-10-CM | POA: Diagnosis not present

## 2020-04-15 DIAGNOSIS — E876 Hypokalemia: Secondary | ICD-10-CM | POA: Insufficient documentation

## 2020-04-15 DIAGNOSIS — D6481 Anemia due to antineoplastic chemotherapy: Secondary | ICD-10-CM | POA: Diagnosis not present

## 2020-04-15 DIAGNOSIS — Z5111 Encounter for antineoplastic chemotherapy: Secondary | ICD-10-CM | POA: Insufficient documentation

## 2020-04-15 DIAGNOSIS — C787 Secondary malignant neoplasm of liver and intrahepatic bile duct: Secondary | ICD-10-CM | POA: Diagnosis not present

## 2020-04-15 DIAGNOSIS — C259 Malignant neoplasm of pancreas, unspecified: Secondary | ICD-10-CM

## 2020-04-15 DIAGNOSIS — I2510 Atherosclerotic heart disease of native coronary artery without angina pectoris: Secondary | ICD-10-CM | POA: Insufficient documentation

## 2020-04-15 DIAGNOSIS — C771 Secondary and unspecified malignant neoplasm of intrathoracic lymph nodes: Secondary | ICD-10-CM | POA: Insufficient documentation

## 2020-04-15 DIAGNOSIS — I739 Peripheral vascular disease, unspecified: Secondary | ICD-10-CM | POA: Insufficient documentation

## 2020-04-15 DIAGNOSIS — R5383 Other fatigue: Secondary | ICD-10-CM | POA: Diagnosis not present

## 2020-04-15 DIAGNOSIS — R918 Other nonspecific abnormal finding of lung field: Secondary | ICD-10-CM | POA: Diagnosis not present

## 2020-04-15 DIAGNOSIS — N281 Cyst of kidney, acquired: Secondary | ICD-10-CM | POA: Insufficient documentation

## 2020-04-15 DIAGNOSIS — D509 Iron deficiency anemia, unspecified: Secondary | ICD-10-CM | POA: Insufficient documentation

## 2020-04-15 DIAGNOSIS — R599 Enlarged lymph nodes, unspecified: Secondary | ICD-10-CM | POA: Diagnosis not present

## 2020-04-15 DIAGNOSIS — I700 Atherosclerosis of aorta: Secondary | ICD-10-CM | POA: Insufficient documentation

## 2020-04-15 DIAGNOSIS — R948 Abnormal results of function studies of other organs and systems: Secondary | ICD-10-CM | POA: Insufficient documentation

## 2020-04-15 DIAGNOSIS — Z888 Allergy status to other drugs, medicaments and biological substances status: Secondary | ICD-10-CM | POA: Insufficient documentation

## 2020-04-15 DIAGNOSIS — E1165 Type 2 diabetes mellitus with hyperglycemia: Secondary | ICD-10-CM | POA: Diagnosis not present

## 2020-04-15 DIAGNOSIS — Z836 Family history of other diseases of the respiratory system: Secondary | ICD-10-CM | POA: Insufficient documentation

## 2020-04-15 DIAGNOSIS — E785 Hyperlipidemia, unspecified: Secondary | ICD-10-CM | POA: Insufficient documentation

## 2020-04-15 DIAGNOSIS — Z87891 Personal history of nicotine dependence: Secondary | ICD-10-CM | POA: Insufficient documentation

## 2020-04-15 DIAGNOSIS — R109 Unspecified abdominal pain: Secondary | ICD-10-CM | POA: Insufficient documentation

## 2020-04-15 DIAGNOSIS — Z79899 Other long term (current) drug therapy: Secondary | ICD-10-CM | POA: Insufficient documentation

## 2020-04-15 DIAGNOSIS — C7951 Secondary malignant neoplasm of bone: Secondary | ICD-10-CM | POA: Insufficient documentation

## 2020-04-15 DIAGNOSIS — E1140 Type 2 diabetes mellitus with diabetic neuropathy, unspecified: Secondary | ICD-10-CM | POA: Insufficient documentation

## 2020-04-15 DIAGNOSIS — Z794 Long term (current) use of insulin: Secondary | ICD-10-CM | POA: Insufficient documentation

## 2020-04-15 DIAGNOSIS — T451X5A Adverse effect of antineoplastic and immunosuppressive drugs, initial encounter: Secondary | ICD-10-CM

## 2020-04-15 DIAGNOSIS — K219 Gastro-esophageal reflux disease without esophagitis: Secondary | ICD-10-CM | POA: Diagnosis not present

## 2020-04-15 DIAGNOSIS — L271 Localized skin eruption due to drugs and medicaments taken internally: Secondary | ICD-10-CM

## 2020-04-15 DIAGNOSIS — Z809 Family history of malignant neoplasm, unspecified: Secondary | ICD-10-CM | POA: Insufficient documentation

## 2020-04-15 DIAGNOSIS — R160 Hepatomegaly, not elsewhere classified: Secondary | ICD-10-CM | POA: Diagnosis not present

## 2020-04-15 DIAGNOSIS — Z808 Family history of malignant neoplasm of other organs or systems: Secondary | ICD-10-CM | POA: Insufficient documentation

## 2020-04-15 DIAGNOSIS — C257 Malignant neoplasm of other parts of pancreas: Secondary | ICD-10-CM | POA: Diagnosis present

## 2020-04-15 LAB — COMPREHENSIVE METABOLIC PANEL
ALT: 16 U/L (ref 0–44)
AST: 18 U/L (ref 15–41)
Albumin: 3.6 g/dL (ref 3.5–5.0)
Alkaline Phosphatase: 58 U/L (ref 38–126)
Anion gap: 8 (ref 5–15)
BUN: 18 mg/dL (ref 8–23)
CO2: 25 mmol/L (ref 22–32)
Calcium: 9 mg/dL (ref 8.9–10.3)
Chloride: 108 mmol/L (ref 98–111)
Creatinine, Ser: 0.91 mg/dL (ref 0.61–1.24)
GFR calc Af Amer: >60 mL/min (ref >60)
GFR calc non Af Amer: >60 mL/min (ref >60)
Glucose, Bld: 132 mg/dL — ABNORMAL HIGH (ref 70–99)
Potassium: 4.2 mmol/L (ref 3.5–5.1)
Sodium: 141 mmol/L (ref 135–145)
Total Bilirubin: 0.6 mg/dL (ref 0.3–1.2)
Total Protein: 6.5 g/dL (ref 6.5–8.1)

## 2020-04-15 LAB — CBC WITH DIFFERENTIAL/PLATELET
Abs Immature Granulocytes: 0.02 10*3/uL (ref 0.00–0.07)
Basophils Absolute: 0 10*3/uL (ref 0.0–0.1)
Basophils Relative: 1 %
Eosinophils Absolute: 0.2 10*3/uL (ref 0.0–0.5)
Eosinophils Relative: 4 %
HCT: 32.8 % — ABNORMAL LOW (ref 39.0–52.0)
Hemoglobin: 10.4 g/dL — ABNORMAL LOW (ref 13.0–17.0)
Immature Granulocytes: 1 %
Lymphocytes Relative: 19 %
Lymphs Abs: 0.8 10*3/uL (ref 0.7–4.0)
MCH: 30 pg (ref 26.0–34.0)
MCHC: 31.7 g/dL (ref 30.0–36.0)
MCV: 94.5 fL (ref 80.0–100.0)
Monocytes Absolute: 0.5 10*3/uL (ref 0.1–1.0)
Monocytes Relative: 11 %
Neutro Abs: 2.7 10*3/uL (ref 1.7–7.7)
Neutrophils Relative %: 64 %
Platelets: 198 10*3/uL (ref 150–400)
RBC: 3.47 MIL/uL — ABNORMAL LOW (ref 4.22–5.81)
RDW: 16.8 % — ABNORMAL HIGH (ref 11.5–15.5)
WBC: 4.3 10*3/uL (ref 4.0–10.5)
nRBC: 0 % (ref 0.0–0.2)

## 2020-04-15 MED ORDER — SODIUM CHLORIDE 0.9 % IV SOLN
10.0000 mg | Freq: Once | INTRAVENOUS | Status: AC
  Administered 2020-04-15: 10 mg via INTRAVENOUS
  Filled 2020-04-15: qty 10

## 2020-04-15 MED ORDER — HEPARIN SOD (PORK) LOCK FLUSH 100 UNIT/ML IV SOLN
500.0000 [IU] | Freq: Once | INTRAVENOUS | Status: DC | PRN
  Filled 2020-04-15: qty 5

## 2020-04-15 MED ORDER — SODIUM CHLORIDE 0.9 % IV SOLN
421.0000 mg/m2 | Freq: Once | INTRAVENOUS | Status: AC
  Administered 2020-04-15: 800 mg via INTRAVENOUS
  Filled 2020-04-15: qty 25

## 2020-04-15 MED ORDER — SODIUM CHLORIDE 0.9 % IV SOLN
2400.0000 mg/m2 | INTRAVENOUS | Status: DC
  Administered 2020-04-15: 4550 mg via INTRAVENOUS
  Filled 2020-04-15: qty 91

## 2020-04-15 MED ORDER — PALONOSETRON HCL INJECTION 0.25 MG/5ML
0.2500 mg | Freq: Once | INTRAVENOUS | Status: AC
  Administered 2020-04-15: 0.25 mg via INTRAVENOUS
  Filled 2020-04-15: qty 5

## 2020-04-15 MED ORDER — SODIUM CHLORIDE 0.9 % IV SOLN
Freq: Once | INTRAVENOUS | Status: AC
  Filled 2020-04-15: qty 250

## 2020-04-15 MED ORDER — SODIUM CHLORIDE 0.9 % IV SOLN
67.9000 mg/m2 | Freq: Once | INTRAVENOUS | Status: AC
  Administered 2020-04-15: 129 mg via INTRAVENOUS
  Filled 2020-04-15: qty 30

## 2020-04-15 NOTE — Progress Notes (Signed)
Hematology/Oncology  Follow up note [facility] [phone] [phone]   Patient Care Team: Paulene Floor as PCP - General (Physician Assistant) Clent Jacks, RN as Oncology Nurse Navigator Earlie Server, MD as Consulting Physician (Hematology and Oncology) Neldon Labella, RN as Case Manager Lonia Farber, MD as Consulting Physician (Internal Medicine)  REFERRING PROVIDER: Trinna Post, PA-C  CHIEF COMPLAINTS/REASON FOR VISIT:  Follow up for treatment of pancreatic cancer  HISTORY OF PRESENTING ILLNESS:   Craig [name] is a  74 y.o.  male with PMH listed below, including CABG x5, PVD, hypertension, former tobacco abuse, former alcohol abuse, iron deficiency anemia, was seen in consultation at the request of  Terrilee Croak, Adriana M, PA-C  for evaluation of abnormal CT Patient recently presented to primary care provider complaining for abdominal pain radiating to his back for about 10 days.  Patient uses Tylenol as needed for pain. Work-up showed elevated amylase, lipase, mild anemia with hemoglobin of 11.3 Acute hepatitis panel negative. 05/16/2019 CT abdomen pelvis with contrast showed poorly marginated heterogeneous hypodense 2.9 cm pancreatic mass at the uncinate process, worrisome for primary pancreatic cancer.  No biliary or pancreatic duct dilation. Several ill defined hypodense liver masses scattered throughout the liver, largest 3.1 cm in the segment 6 right liver lobe. Nonspecific portacaval and aortocaval adenopathy. Extreme medial left lung base 4.4 cm pleural-based mass probably a pleural metastasis.  Additional tiny pulmonary nodules scattered at the right lung base are indeterminate. Mild prostate or megaly  He had a history of 37.5-pack-year smoking history quitting 2018. No longer drinking alcohol.  # ultrasound-guided liver biopsy on 05/21/2019.  Pathology showed fragments of benign hepatic parenchyma showing  minimal macro vascular steatosis, otherwise no significant histo pathologic change. Single core of renal tissue showing approximately 25% glomerulosclerosis  # #History of CAD, status post CABG x5.  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  # Omniseq test showed PD-L1 50% TPS, TMB high, negative for BRCA1/2 or NTRK fusion.  MSI could not be completed.  Patient declines genetic testing  #  third line chemotherapy treatment 5-FU with liposomal irinotecan 01/24/2020-01/26/2020 patient was admitted to ICU due to DKA. on long-acting insulin 20 units at night and takes sliding scale with meals.  INTERVAL HISTORY Quinterius Gaida is a 74 y.o. male who has above history reviewed by me today presents for evaluation of chemotherapy tolerability for metastatic pancreatic cancer.   Problems and complaints are listed below: Patient is on third line chemotherapy treatments and tolerates well.  He reports hand cracks and feels his hand skin is like " sandpaper".  Denies any nausea, vomiting, or diarrhea.  Patient reports feeling well.  He has no new complaints.  .. Review of Systems  Constitutional: Positive for fatigue. Negative for appetite change, chills, fever and unexpected weight change.  HENT:   Negative for hearing loss and voice change.   Eyes: Negative for eye problems and icterus.  Respiratory: Negative for chest tightness, cough and shortness of breath.   Cardiovascular: Negative for chest pain and leg swelling.  Gastrointestinal: Negative for abdominal distention and abdominal pain.  Endocrine: Negative for hot flashes.  Genitourinary: Negative for difficulty urinating, dysuria and frequency.   Musculoskeletal: Negative for arthralgias.  Skin: Negative for itching and rash.       Cracks.   Neurological: Positive for numbness. Negative for light-headedness.  Hematological: Negative for adenopathy. Does not bruise/bleed easily.  Psychiatric/Behavioral: Negative for confusion.    MEDICAL HISTORY:  Past Medical History:  Diagnosis Date  . Allergic rhinitis   . Cancer [facility])    new dx Pancreatic Cancer - Aug 2020  . CHF (congestive heart failure) ([place])   . Chronic cholecystitis   . Coronary artery disease   . DDD (degenerative disc disease), cervical   . Dehydration 06/21/2019  . Diabetes mellitus without complication ([place])   . Dyspnea   . Early cataract   . Erectile dysfunction   . GERD (gastroesophageal reflux disease)   . Gouty arthritis   . Headache    occasional migraines  . Hypercalcemia   . Hyperlipemia   . Hypertension   . Palpitations   . Peripheral vascular disease ([place])   . 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   LIMA to LAD SVG to [place] SVG SEQUENTIALLY to OM1 and OM2 SVG to ACUTE MARGINAL  . Spinal stenosis of cervical region   . Vitamin D deficiency     SURGICAL HISTORY: Past Surgical History:  Procedure Laterality Date  . BACK SURGERY  2018    fusion with screws  . CHOLECYSTECTOMY N/A 11/13/2018   Procedure: LAPAROSCOPIC CHOLECYSTECTOMY;  Surgeon: Vickie Epley, MD;  Location: [facility] ORS;  Service: General;  Laterality: N/A;  . COLONOSCOPY    . CORONARY ARTERY BYPASS GRAFT N/A 09/30/2017   Procedure: CORONARY ARTERY BYPASS GRAFTING times five using right and left Saphaneous vein harvested endoscopicly  and left internal mammary artery. (CABG),TEE;  Surgeon: Rexene Alberts, MD;  Location: [place];  Service: Open Heart Surgery;  Laterality: N/A;  . LEFT HEART CATH AND CORONARY ANGIOGRAPHY Left 09/06/2017   Procedure: LEFT HEART CATH AND CORONARY ANGIOGRAPHY;  Surgeon: Corey Skains, MD;  Location: [place] CV LAB;  Service: Cardiovascular;  Laterality: Left;  . percutaneous transluminal balloon angioplasty  01/2010   of left lower extremity  . PORTA CATH INSERTION N/A 06/06/2019   Procedure: PORTA CATH  INSERTION;  Surgeon: Katha Cabal, MD;  Location: [place] CV LAB;  Service: Cardiovascular;  Laterality: N/A;  . TEE WITHOUT CARDIOVERSION N/A 09/30/2017   Procedure: TRANSESOPHAGEAL ECHOCARDIOGRAM (TEE);  Surgeon: Rexene Alberts, MD;  Location: [place];  Service: Open Heart Surgery;  Laterality: N/A;  . VASCULAR SURGERY Left 2010   left exernal iliac and superficial femoral artery PTCA and stenting    SOCIAL HISTORY: Social History   Socioeconomic History  . Marital status: Married    Spouse name: Enid Derry  . Number of children: 2  . Years of education: Not on file  . Highest education level: 5th grade  Occupational History  . Occupation: drove tractors    Comment: retired  Tobacco Use  . Smoking status: Former Smoker    Packs/day: 0.75    Years: 50.00    Pack years: 37.50    Types: Cigarettes    Quit date: 09/27/2017    Years since quitting: 2.5  . Smokeless tobacco: Former Systems developer    Types: Secondary school teacher  . Vaping Use: Never used  Substance and Sexual Activity  . Alcohol use: Not Currently    Comment: stopped drinking before cabg  . Drug use: Not Currently    Types: Marijuana    Comment: stopped smoking before CABG  . Sexual activity: Not Currently  Other Topics Concern  . Not on file  Social History Narrative  . Not on file  Social Determinants of Health   Financial Resource Strain: Low Risk   . Difficulty of Paying Living Expenses: Not hard at all  Food Insecurity: No Food Insecurity  . Worried About Charity fundraiser in the Last Year: Never true  . Ran Out of Food in the Last Year: Never true  Transportation Needs: No Transportation Needs  . Lack of Transportation (Medical): No  . Lack of Transportation (Non-Medical): No  Physical Activity: Inactive  . Days of Exercise per Week: 0 days  . Minutes of Exercise per Session: 0 min  Stress: No Stress Concern Present  . Feeling of Stress : Not at all  Social Connections: Moderately Isolated  .  Frequency of Communication with Friends and Family: More than three times a week  . Frequency of Social Gatherings with Friends and Family: More than three times a week  . Attends Religious Services: Never  . Active Member of Clubs or Organizations: No  . Attends Archivist Meetings: Never  . Marital Status: Married  Human resources officer Violence: Not At Risk  . Fear of Current or Ex-Partner: No  . Emotionally Abused: No  . Physically Abused: No  . Sexually Abused: No    FAMILY HISTORY: Family History  Problem Relation Age of Onset  . Brain cancer Mother   . Emphysema Father   . Cancer Brother     ALLERGIES:  is allergic to lipitor [atorvastatin], zetia [ezetimibe], lisinopril, protonix [pantoprazole], and spironolactone.  MEDICATIONS:  Current Outpatient Medications  Medication Sig Dispense Refill  . aspirin EC 81 MG tablet Take 81 mg by mouth daily.    . carvedilol (COREG) 6.25 MG tablet Take 6.25 mg by mouth 2 (two) times daily with a meal.  3  . Continuous Blood Gluc Receiver (FREESTYLE LIBRE 14 DAY READER) DEVI 1 Device by Does not apply route every 14 (fourteen) days. 6 each 1  . diltiazem (CARDIZEM CD) 300 MG 24 hr capsule TAKE 1 CAPSULE BY MOUTH EVERY DAY 90 capsule 1  . dorzolamide-timolol (COSOPT) 22.3-6.8 MG/ML ophthalmic solution INSTILL ONE DROP INTO BOTH EYES TWICE DAILY    . ferrous sulfate 325 (65 FE) MG tablet TAKE 1 TABLET BY MOUTH EVERY DAY WITH BREAKFAST 90 tablet 1  . gabapentin (NEURONTIN) 300 MG capsule TAKE 2 CAPSULES (600 MG TOTAL) BY MOUTH 3 (THREE) TIMES DAILY. 540 capsule 0  . ibuprofen (ADVIL) 800 MG tablet Take 1 tablet (800 mg total) by mouth every 8 (eight) hours as needed. 30 tablet 0  . insulin glargine (LANTUS SOLOSTAR) 100 UNIT/ML Solostar Pen Inject 20 Units into the skin at bedtime. 15 mL 0  . insulin lispro (HUMALOG) 100 UNIT/ML KwikPen Inject 0.05 mLs (5 Units total) into the skin 3 (three) times daily with meals. 15 mL 11  . Insulin  Pen Needle (PEN NEEDLES) 32G X 5 MM MISC Use four times daily for insulin 400 each 1  . latanoprost (XALATAN) 0.005 % ophthalmic solution Place 1 drop into both eyes at bedtime.   3  . lidocaine-prilocaine (EMLA) cream APPLY TO AFFECTED AREA ONCE AS DIRECTED    . loperamide (IMODIUM A-D) 2 MG tablet Take 1 tablet (2 mg total) by mouth See admin instructions. Take 2 at diarrhea onset , then 1 every 2hr until 12hrs with no BM. May take 2 every 4hrs at night. If diarrhea recurs repeat. (Patient not taking: Reported on 03/31/2020) 100 tablet 1  . Multiple Vitamin (MULTIVITAMIN WITH MINERALS) TABS tablet Take 1 tablet by  mouth daily.    . OMEGA-3 FATTY ACIDS PO Take 1,200 mg by mouth daily.     [name] Kitchen omeprazole (PRILOSEC) 40 MG capsule TAKE 1 CAPSULE BY MOUTH EVERY DAY 90 capsule 1  . ONETOUCH VERIO test strip TEST FASTING SUGAR EVERY MORNING 100 strip 3  . oxyCODONE (OXY IR/ROXICODONE) 5 MG immediate release tablet Take 1 tablet (5 mg total) by mouth every 6 (six) hours as needed for severe pain. (Patient not taking: Reported on 03/31/2020) 30 tablet 0  . potassium chloride (KLOR-CON) 10 MEQ tablet TAKE 1 TABLET BY MOUTH EVERY DAY 30 tablet 2   No current facility-administered medications for this visit.   Facility-Administered Medications Ordered in Other Visits  Medication Dose Route Frequency Provider Last Rate Last Admin  . sodium chloride flush (NS) 0.9 % injection 10 mL  10 mL Intracatheter PRN Earlie Server, MD      . sodium chloride flush (NS) 0.9 % injection 10 mL  10 mL Intravenous Once Earlie Server, MD         PHYSICAL EXAMINATION: ECOG PERFORMANCE STATUS: 1 - Symptomatic but completely ambulatory There were no vitals filed for this visit. There were no vitals filed for this visit.  Physical Exam Constitutional:      General: He is not in acute distress. HENT:     Head: Normocephalic and atraumatic.  Eyes:     General: No scleral icterus. Cardiovascular:     Rate and Rhythm: Normal rate and  regular rhythm.     Heart sounds: Normal heart sounds.  Pulmonary:     Effort: Pulmonary effort is normal. No respiratory distress.     Breath sounds: No wheezing.  Abdominal:     General: Bowel sounds are normal. There is no distension.     Palpations: Abdomen is soft.  Musculoskeletal:        General: No deformity. Normal range of motion.     Cervical back: Normal range of motion and neck supple.  Skin:    General: Skin is warm and dry.     Findings: No erythema or rash.  Neurological:     Mental Status: He is alert and oriented to person, place, and time. Mental status is at baseline.     Cranial Nerves: No cranial nerve deficit.     Coordination: Coordination normal.  Psychiatric:        Mood and Affect: Mood normal.     LABORATORY DATA:  I have reviewed the data as listed Lab Results  Component Value Date   WBC 5.4 03/31/2020   HGB 10.5 (L) 03/31/2020   HCT 33.3 (L) 03/31/2020   MCV 94.1 03/31/2020   PLT 185 03/31/2020   Recent Labs    03/03/20 0818 03/17/20 0806 03/31/20 0757  NA 141 142 140  K 4.0 4.0 4.0  CL 109 108 108  CO2 '24 25 23  '$ GLUCOSE 121* 145* 132*  BUN '23 20 18  '$ CREATININE 0.85 0.91 1.10  CALCIUM 8.4* 8.7* 9.0  GFRNONAA >60 >60 >60  GFRAA >60 >60 >60  PROT 6.3* 6.3* 6.6  ALBUMIN 3.4* 3.5 3.7  AST '21 19 16  '$ ALT '25 21 16  '$ ALKPHOS 73 64 62  BILITOT 0.6 0.7 0.6   Iron/TIBC/Ferritin/ %Sat    Component Value Date/Time   IRON 119 04/27/2019 0940   TIBC 363 04/27/2019 0940   FERRITIN 58 04/27/2019 0940   IRONPCTSAT 33 04/27/2019 0940      RADIOGRAPHIC STUDIES: I have personally reviewed the  radiological images as listed and agreed with the findings in the report. No results found.    ASSESSMENT & PLAN:  1. Pancreatic cancer metastasized to liver ([place])   2. Anemia due to antineoplastic chemotherapy   3. Encounter for antineoplastic chemotherapy   4. Hypokalemia    #Metastatic pancreatic cancer- liver mets, hilar nodal metastasis,  ?bone mets, CA 19-9 trending down to 6679.  Today's CA 19-9 is pending. Labs are reviewed and discussed with patient. Counts are acceptable to proceed with cycle 8 5-FU and liposomal irinotecan. Obtain CT chest abdomen pelvis.  For evaluation of treatment response.  #Chronic hypokalemia, K has improved to 4.2.  Continue potassium chloride 10 mEq daily #  diabetes/ hyperglycemia, hyperglycemia is better controlled now on insulin regimen Follows up with primary care provider for management.  Glucose level is 132 today.  Stable.  #Normocytic anemia, likely secondary to chemo.  Hemoglobin is stable.  Continue to monitor.  #All questions were answered. The patient knows to call the clinic with any problems questions or concerns. Follow-up  2 weeks.   Earlie Server, MD, PhD Hematology Oncology [place] at [facility] 04/15/2020

## 2020-04-15 NOTE — Progress Notes (Signed)
Patient here for follow up. Patient reports that hands and fingernails are changing colors. Also reports his fingers feel like sandpaper.

## 2020-04-17 ENCOUNTER — Other Ambulatory Visit: Payer: Self-pay

## 2020-04-17 ENCOUNTER — Inpatient Hospital Stay: Payer: Medicare Other

## 2020-04-17 VITALS — BP 154/73 | HR 67 | Temp 96.1°F | Resp 20

## 2020-04-17 DIAGNOSIS — C259 Malignant neoplasm of pancreas, unspecified: Secondary | ICD-10-CM

## 2020-04-17 DIAGNOSIS — C787 Secondary malignant neoplasm of liver and intrahepatic bile duct: Secondary | ICD-10-CM

## 2020-04-17 DIAGNOSIS — Z5111 Encounter for antineoplastic chemotherapy: Secondary | ICD-10-CM | POA: Diagnosis not present

## 2020-04-17 MED ORDER — SODIUM CHLORIDE 0.9% FLUSH
10.0000 mL | INTRAVENOUS | Status: DC | PRN
  Administered 2020-04-17: 10 mL
  Filled 2020-04-17: qty 10

## 2020-04-17 MED ORDER — HEPARIN SOD (PORK) LOCK FLUSH 100 UNIT/ML IV SOLN
500.0000 [IU] | Freq: Once | INTRAVENOUS | Status: AC | PRN
  Administered 2020-04-17: 500 [IU]
  Filled 2020-04-17: qty 5

## 2020-04-17 MED ORDER — HEPARIN SOD (PORK) LOCK FLUSH 100 UNIT/ML IV SOLN
INTRAVENOUS | Status: AC
  Filled 2020-04-17: qty 5

## 2020-04-23 ENCOUNTER — Other Ambulatory Visit: Payer: Self-pay

## 2020-04-23 ENCOUNTER — Ambulatory Visit
Admission: RE | Admit: 2020-04-23 | Discharge: 2020-04-23 | Disposition: A | Discharge status: Home / Self Care | Payer: Medicare Other | Source: Ambulatory Visit | Attending: Oncology | Admitting: Oncology

## 2020-04-23 DIAGNOSIS — C787 Secondary malignant neoplasm of liver and intrahepatic bile duct: Secondary | ICD-10-CM | POA: Insufficient documentation

## 2020-04-23 DIAGNOSIS — C259 Malignant neoplasm of pancreas, unspecified: Secondary | ICD-10-CM | POA: Insufficient documentation

## 2020-04-23 MED ORDER — IOHEXOL 300 MG/ML  SOLN
100.0000 mL | Freq: Once | INTRAMUSCULAR | Status: AC | PRN
  Administered 2020-04-23: 100 mL via INTRAVENOUS

## 2020-04-24 ENCOUNTER — Ambulatory Visit: Payer: Self-pay

## 2020-04-28 ENCOUNTER — Other Ambulatory Visit: Payer: Self-pay | Admitting: Oncology

## 2020-04-28 ENCOUNTER — Other Ambulatory Visit: Payer: Medicare Other

## 2020-04-28 ENCOUNTER — Ambulatory Visit: Payer: Medicare Other | Admitting: Oncology

## 2020-04-28 ENCOUNTER — Ambulatory Visit: Payer: Medicare Other

## 2020-04-29 ENCOUNTER — Inpatient Hospital Stay (HOSPITAL_BASED_OUTPATIENT_CLINIC_OR_DEPARTMENT_OTHER): Payer: Medicare Other | Admitting: Oncology

## 2020-04-29 ENCOUNTER — Ambulatory Visit: Payer: Medicare Other

## 2020-04-29 ENCOUNTER — Inpatient Hospital Stay: Payer: Medicare Other

## 2020-04-29 ENCOUNTER — Other Ambulatory Visit: Payer: Medicare Other

## 2020-04-29 ENCOUNTER — Encounter: Payer: Self-pay | Admitting: Oncology

## 2020-04-29 ENCOUNTER — Ambulatory Visit: Payer: Medicare Other | Admitting: Oncology

## 2020-04-29 ENCOUNTER — Other Ambulatory Visit: Payer: Self-pay

## 2020-04-29 VITALS — BP 157/72 | HR 73 | Temp 96.4°F | Resp 18 | Wt 178.5 lb

## 2020-04-29 DIAGNOSIS — E876 Hypokalemia: Secondary | ICD-10-CM | POA: Diagnosis not present

## 2020-04-29 DIAGNOSIS — T451X5A Adverse effect of antineoplastic and immunosuppressive drugs, initial encounter: Secondary | ICD-10-CM

## 2020-04-29 DIAGNOSIS — C259 Malignant neoplasm of pancreas, unspecified: Secondary | ICD-10-CM

## 2020-04-29 DIAGNOSIS — C787 Secondary malignant neoplasm of liver and intrahepatic bile duct: Secondary | ICD-10-CM

## 2020-04-29 DIAGNOSIS — D6481 Anemia due to antineoplastic chemotherapy: Secondary | ICD-10-CM

## 2020-04-29 DIAGNOSIS — Z5111 Encounter for antineoplastic chemotherapy: Secondary | ICD-10-CM

## 2020-04-29 DIAGNOSIS — Z95828 Presence of other vascular implants and grafts: Secondary | ICD-10-CM

## 2020-04-29 DIAGNOSIS — G629 Polyneuropathy, unspecified: Secondary | ICD-10-CM

## 2020-04-29 LAB — COMPREHENSIVE METABOLIC PANEL
ALT: 17 U/L (ref 0–44)
AST: 18 U/L (ref 15–41)
Albumin: 3.6 g/dL (ref 3.5–5.0)
Alkaline Phosphatase: 64 U/L (ref 38–126)
Anion gap: 7 (ref 5–15)
BUN: 21 mg/dL (ref 8–23)
CO2: 24 mmol/L (ref 22–32)
Calcium: 9.1 mg/dL (ref 8.9–10.3)
Chloride: 109 mmol/L (ref 98–111)
Creatinine, Ser: 1.09 mg/dL (ref 0.61–1.24)
GFR calc Af Amer: >60 mL/min (ref >60)
GFR calc non Af Amer: >60 mL/min (ref >60)
Glucose, Bld: 148 mg/dL — ABNORMAL HIGH (ref 70–99)
Potassium: 4.2 mmol/L (ref 3.5–5.1)
Sodium: 140 mmol/L (ref 135–145)
Total Bilirubin: 0.6 mg/dL (ref 0.3–1.2)
Total Protein: 6.4 g/dL — ABNORMAL LOW (ref 6.5–8.1)

## 2020-04-29 LAB — CBC WITH DIFFERENTIAL/PLATELET
Abs Immature Granulocytes: 0.03 10*3/uL (ref 0.00–0.07)
Basophils Absolute: 0 10*3/uL (ref 0.0–0.1)
Basophils Relative: 1 %
Eosinophils Absolute: 0.2 10*3/uL (ref 0.0–0.5)
Eosinophils Relative: 3 %
HCT: 32.5 % — ABNORMAL LOW (ref 39.0–52.0)
Hemoglobin: 10.5 g/dL — ABNORMAL LOW (ref 13.0–17.0)
Immature Granulocytes: 1 %
Lymphocytes Relative: 19 %
Lymphs Abs: 0.9 10*3/uL (ref 0.7–4.0)
MCH: 30.4 pg (ref 26.0–34.0)
MCHC: 32.3 g/dL (ref 30.0–36.0)
MCV: 94.2 fL (ref 80.0–100.0)
Monocytes Absolute: 0.5 10*3/uL (ref 0.1–1.0)
Monocytes Relative: 10 %
Neutro Abs: 3.1 10*3/uL (ref 1.7–7.7)
Neutrophils Relative %: 66 %
Platelets: 180 10*3/uL (ref 150–400)
RBC: 3.45 MIL/uL — ABNORMAL LOW (ref 4.22–5.81)
RDW: 16.1 % — ABNORMAL HIGH (ref 11.5–15.5)
WBC: 4.6 10*3/uL (ref 4.0–10.5)
nRBC: 0 % (ref 0.0–0.2)

## 2020-04-29 MED ORDER — PALONOSETRON HCL INJECTION 0.25 MG/5ML
0.2500 mg | Freq: Once | INTRAVENOUS | Status: AC
  Administered 2020-04-29: 0.25 mg via INTRAVENOUS
  Filled 2020-04-29: qty 5

## 2020-04-29 MED ORDER — SODIUM CHLORIDE 0.9 % IV SOLN
Freq: Once | INTRAVENOUS | Status: AC
  Filled 2020-04-29: qty 250

## 2020-04-29 MED ORDER — SODIUM CHLORIDE 0.9% FLUSH
10.0000 mL | INTRAVENOUS | Status: DC | PRN
  Administered 2020-04-29: 10 mL via INTRAVENOUS
  Filled 2020-04-29: qty 10

## 2020-04-29 MED ORDER — SODIUM CHLORIDE 0.9 % IV SOLN
10.0000 mg | Freq: Once | INTRAVENOUS | Status: AC
  Administered 2020-04-29: 10 mg via INTRAVENOUS
  Filled 2020-04-29: qty 10

## 2020-04-29 MED ORDER — SODIUM CHLORIDE 0.9 % IV SOLN
421.0000 mg/m2 | Freq: Once | INTRAVENOUS | Status: AC
  Administered 2020-04-29: 800 mg via INTRAVENOUS
  Filled 2020-04-29: qty 25

## 2020-04-29 MED ORDER — SODIUM CHLORIDE 0.9% FLUSH
10.0000 mL | INTRAVENOUS | Status: DC | PRN
  Administered 2020-04-29: 10 mL
  Filled 2020-04-29: qty 10

## 2020-04-29 MED ORDER — SODIUM CHLORIDE 0.9 % IV SOLN
68.0000 mg/m2 | Freq: Once | INTRAVENOUS | Status: AC
  Administered 2020-04-29: 129 mg via INTRAVENOUS
  Filled 2020-04-29: qty 30

## 2020-04-29 MED ORDER — SODIUM CHLORIDE 0.9 % IV SOLN
2400.0000 mg/m2 | INTRAVENOUS | Status: DC
  Administered 2020-04-29: 4550 mg via INTRAVENOUS
  Filled 2020-04-29: qty 91

## 2020-04-29 NOTE — Progress Notes (Signed)
Patient here for follow up. Patient saw PCP last week and insulin dose was reduced.

## 2020-04-29 NOTE — Progress Notes (Signed)
Hematology/Oncology  Follow up note [facility] [phone] [phone]   Patient Care Team: Paulene Floor as PCP - General (Physician Assistant) Clent Jacks, RN as Oncology Nurse Navigator Earlie Server, MD as Consulting Physician (Hematology and Oncology) Neldon Labella, RN as Case Manager Lonia Farber, MD as Consulting Physician (Internal Medicine)  REFERRING PROVIDER: Trinna Post, PA-C  CHIEF COMPLAINTS/REASON FOR VISIT:  Follow up for treatment of pancreatic cancer  HISTORY OF PRESENTING ILLNESS:   Craig [name] is a  74 y.o.  male with PMH listed below, including CABG x5, PVD, hypertension, former tobacco abuse, former alcohol abuse, iron deficiency anemia, was seen in consultation at the request of  Terrilee Croak, Adriana M, PA-C  for evaluation of abnormal CT Patient recently presented to primary care provider complaining for abdominal pain radiating to his back for about 10 days.  Patient uses Tylenol as needed for pain. Work-up showed elevated amylase, lipase, mild anemia with hemoglobin of 11.3 Acute hepatitis panel negative. 05/16/2019 CT abdomen pelvis with contrast showed poorly marginated heterogeneous hypodense 2.9 cm pancreatic mass at the uncinate process, worrisome for primary pancreatic cancer.  No biliary or pancreatic duct dilation. Several ill defined hypodense liver masses scattered throughout the liver, largest 3.1 cm in the segment 6 right liver lobe. Nonspecific portacaval and aortocaval adenopathy. Extreme medial left lung base 4.4 cm pleural-based mass probably a pleural metastasis.  Additional tiny pulmonary nodules scattered at the right lung base are indeterminate. Mild prostate or megaly  He had a history of 37.5-pack-year smoking history quitting 2018. No longer drinking alcohol.  # ultrasound-guided liver biopsy on 05/21/2019.  Pathology showed fragments of benign hepatic parenchyma showing  minimal macro vascular steatosis, otherwise no significant histo pathologic change. Single core of renal tissue showing approximately 25% glomerulosclerosis  # #History of CAD, status post CABG x5.  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  # Omniseq test showed PD-L1 50% TPS, TMB high, negative for BRCA1/2 or NTRK fusion.  MSI could not be completed.  Patient declines genetic testing  #  third line chemotherapy treatment 5-FU with liposomal irinotecan 01/24/2020-01/26/2020 patient was admitted to ICU due to DKA. on long-acting insulin 20 units at night and takes sliding scale with meals.  INTERVAL HISTORY Craig [name] is a 75 y.o. male who has above history reviewed by me today presents for evaluation of chemotherapy tolerability for metastatic pancreatic cancer.   Problems and complaints are listed below: Patient is on third line chemotherapy treatments and tolerates well. 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 On gabapentin 661m TID  Patient has no new complaints today.  Denies any nausea, vomiting, diarrhea.  .. Review of Systems  Constitutional: Positive for fatigue. Negative for appetite change, chills, fever and unexpected weight change.  HENT:   Negative for hearing loss and voice change.   Eyes: Negative for eye problems and icterus.  Respiratory: Negative for chest tightness, cough and shortness of breath.   Cardiovascular: Negative for chest pain and leg swelling.  Gastrointestinal: Negative for abdominal distention and abdominal pain.  Endocrine: Negative for hot flashes.  Genitourinary: Negative for difficulty urinating, dysuria and frequency.   Musculoskeletal: Negative for arthralgias.  Skin: Negative for itching and rash.       Cracks.   Neurological: Positive for numbness. Negative for light-headedness.  Hematological:  Negative for adenopathy. Does not  bruise/bleed easily.  Psychiatric/Behavioral: Negative for confusion.    MEDICAL HISTORY:  Past Medical History:  Diagnosis Date  . Allergic rhinitis   . CHF (congestive heart failure) ([place])   . Chronic cholecystitis   . Coronary artery disease   . DDD (degenerative disc disease), cervical   . Dehydration 06/21/2019  . Diabetes mellitus without complication ([place])   . Dyspnea   . Early cataract   . Erectile dysfunction   . GERD (gastroesophageal reflux disease)   . Gouty arthritis   . Headache    occasional migraines  . Hypercalcemia   . Hyperlipemia   . Hypertension   . Palpitations   . Pancreatic cancer metastasized to liver ([place]) 05/2019   Chemo tx's  . Peripheral vascular disease ([place])   . 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   LIMA to LAD SVG to [place] SVG SEQUENTIALLY to OM1 and OM2 SVG to ACUTE MARGINAL  . Spinal stenosis of cervical region   . Vitamin D deficiency     SURGICAL HISTORY: Past Surgical History:  Procedure Laterality Date  . BACK SURGERY  2018    fusion with screws  . CHOLECYSTECTOMY N/A 11/13/2018   Procedure: LAPAROSCOPIC CHOLECYSTECTOMY;  Surgeon: Vickie Epley, MD;  Location: [facility];  Service: General;  Laterality: N/A;  . COLONOSCOPY    . CORONARY ARTERY BYPASS GRAFT N/A 09/30/2017   Procedure: CORONARY ARTERY BYPASS GRAFTING times five using right and left Saphaneous vein harvested endoscopicly  and left internal mammary artery. (CABG),TEE;  Surgeon: Rexene Alberts, MD;  Location: [place];  Service: Open Heart Surgery;  Laterality: N/A;  . LEFT HEART CATH AND CORONARY ANGIOGRAPHY Left 09/06/2017   Procedure: LEFT HEART CATH AND CORONARY ANGIOGRAPHY;  Surgeon: Corey Skains, MD;  Location: [place] [facility];  Service: Cardiovascular;  Laterality: Left;  . percutaneous transluminal balloon angioplasty  01/2010   of left lower extremity  . PORTA CATH INSERTION N/A 06/06/2019   Procedure: PORTA CATH  INSERTION;  Surgeon: Katha Cabal, MD;  Location: [place] [facility];  Service: Cardiovascular;  Laterality: N/A;  . TEE WITHOUT CARDIOVERSION N/A 09/30/2017   Procedure: TRANSESOPHAGEAL ECHOCARDIOGRAM (TEE);  Surgeon: Rexene Alberts, MD;  Location: [place];  Service: Open Heart Surgery;  Laterality: N/A;  . VASCULAR SURGERY Left 2010   left exernal iliac and superficial femoral artery PTCA and stenting    SOCIAL HISTORY: Social History   Socioeconomic History  . Marital status: Married    Spouse name: Enid Derry  . Number of children: 2  . Years of education: Not on file  . Highest education level: 5th grade  Occupational History  . Occupation: drove tractors    Comment: retired  Tobacco Use  . Smoking status: Former Smoker    Packs/day: 0.75    Years: 50.00    Pack years: 37.50    Types: Cigarettes    Quit date: 09/27/2017    Years since quitting: 2.5  . Smokeless tobacco: Former Systems developer    Types: Secondary school teacher  . Vaping Use: Never used  Substance and Sexual Activity  . Alcohol use: Not Currently    Comment: stopped drinking before cabg  . Drug use: Not Currently    Types: Marijuana    Comment: stopped smoking before CABG  . Sexual activity: Not Currently  Other Topics Concern  . Not on file  Social History Narrative  . Not on file   Social Determinants of Health   Financial  Resource Strain: Low Risk   . Difficulty of Paying Living Expenses: Not hard at all  Food Insecurity: No Food Insecurity  . Worried About Charity fundraiser in the Last Year: Never true  . Ran Out of Food in the Last Year: Never true  Transportation Needs: No Transportation Needs  . Lack of Transportation (Medical): No  . Lack of Transportation (Non-Medical): No  Physical Activity: Inactive  . Days of Exercise per Week: 0 days  . Minutes of Exercise per Session: 0 min  Stress: No Stress Concern Present  . Feeling of Stress : Not at all  Social Connections: Moderately Isolated  .  Frequency of Communication with Friends and Family: More than three times a week  . Frequency of Social Gatherings with Friends and Family: More than three times a week  . Attends Religious Services: Never  . Active Member of Clubs or Organizations: No  . Attends Archivist Meetings: Never  . Marital Status: Married  Human resources officer Violence: Not At Risk  . Fear of Current or Ex-Partner: No  . Emotionally Abused: No  . Physically Abused: No  . Sexually Abused: No    FAMILY HISTORY: Family History  Problem Relation Age of Onset  . Brain cancer Mother   . Emphysema Father   . Cancer Brother     ALLERGIES:  is allergic to lipitor [atorvastatin], zetia [ezetimibe], lisinopril, protonix [pantoprazole], and spironolactone.  MEDICATIONS:  Current Outpatient Medications  Medication Sig Dispense Refill  . aspirin EC 81 MG tablet Take 81 mg by mouth daily.    . carvedilol (COREG) 6.25 MG tablet Take 6.25 mg by mouth 2 (two) times daily with a meal.  3  . Continuous Blood Gluc Receiver (FREESTYLE LIBRE 14 DAY READER) DEVI 1 Device by Does not apply route every 14 (fourteen) days. 6 each 1  . diltiazem (CARDIZEM CD) 300 MG 24 hr capsule TAKE 1 CAPSULE BY MOUTH EVERY DAY 90 capsule 1  . dorzolamide-timolol (COSOPT) 22.3-6.8 MG/ML ophthalmic solution INSTILL ONE DROP INTO BOTH EYES TWICE DAILY    . ferrous sulfate 325 (65 FE) MG tablet TAKE 1 TABLET BY MOUTH EVERY DAY WITH BREAKFAST 90 tablet 1  . gabapentin (NEURONTIN) 300 MG capsule TAKE 2 CAPSULES (600 MG TOTAL) BY MOUTH 3 (THREE) TIMES DAILY. 540 capsule 0  . ibuprofen (ADVIL) 800 MG tablet Take 1 tablet (800 mg total) by mouth every 8 (eight) hours as needed. 30 tablet 0  . insulin glargine (LANTUS SOLOSTAR) 100 UNIT/ML Solostar Pen Inject 20 Units into the skin at bedtime. 15 mL 0  . insulin lispro (HUMALOG) 100 UNIT/ML KwikPen Inject 0.05 mLs (5 Units total) into the skin 3 (three) times daily with meals. (Patient not taking:  Reported on 04/15/2020) 15 mL 11  . Insulin Pen Needle (PEN NEEDLES) 32G X 5 MM MISC Use four times daily for insulin 400 each 1  . latanoprost (XALATAN) 0.005 % ophthalmic solution Place 1 drop into both eyes at bedtime.   3  . lidocaine-prilocaine (EMLA) cream APPLY TO AFFECTED AREA ONCE AS DIRECTED    . loperamide (IMODIUM A-D) 2 MG tablet Take 1 tablet (2 mg total) by mouth See admin instructions. Take 2 at diarrhea onset , then 1 every 2hr until 12hrs with no BM. May take 2 every 4hrs at night. If diarrhea recurs repeat. 100 tablet 1  . Multiple Vitamin (MULTIVITAMIN WITH MINERALS) TABS tablet Take 1 tablet by mouth daily.    . OMEGA-3  FATTY ACIDS PO Take 1,200 mg by mouth daily.     [name] Kitchen omeprazole (PRILOSEC) 40 MG capsule TAKE 1 CAPSULE BY MOUTH EVERY DAY 90 capsule 1  . ONETOUCH VERIO test strip TEST FASTING SUGAR EVERY MORNING 100 strip 3  . oxyCODONE (OXY IR/ROXICODONE) 5 MG immediate release tablet Take 1 tablet (5 mg total) by mouth every 6 (six) hours as needed for severe pain. (Patient not taking: Reported on 04/15/2020) 30 tablet 0  . potassium chloride (KLOR-CON) 10 MEQ tablet TAKE 1 TABLET BY MOUTH EVERY DAY 30 tablet 2  . rosuvastatin (CRESTOR) 10 MG tablet Take 1 tablet by mouth daily.    [name] Kitchen triamcinolone ointment (KENALOG) 0.5 % APPLY 1 APPLICATION TOPICALLY 3 (THREE) TIMES DAILY. 30 g 0   No current facility-administered medications for this visit.   Facility-Administered Medications Ordered in Other Visits  Medication Dose Route Frequency Provider Last Rate Last Admin  . sodium chloride flush (NS) 0.9 % injection 10 mL  10 mL Intracatheter PRN Earlie Server, MD      . sodium chloride flush (NS) 0.9 % injection 10 mL  10 mL Intravenous Once Earlie Server, MD      . sodium chloride flush (NS) 0.9 % injection 10 mL  10 mL Intravenous PRN Earlie Server, MD   10 mL at 04/29/20 0814     PHYSICAL EXAMINATION: ECOG PERFORMANCE STATUS: 1 - Symptomatic but completely ambulatory Vitals:   04/29/20  0848  BP: (!) 157/72  Pulse: 73  Resp: 18  Temp: (!) 96.4 F (35.8 C)   Filed Weights   04/29/20 0848  Weight: 178 lb 8 oz (81 kg)    Physical Exam Constitutional:      General: He is not in acute distress. HENT:     Head: Normocephalic and atraumatic.  Eyes:     General: No scleral icterus. Cardiovascular:     Rate and Rhythm: Normal rate and regular rhythm.     Heart sounds: Normal heart sounds.  Pulmonary:     Effort: Pulmonary effort is normal. No respiratory distress.     Breath sounds: No wheezing.  Abdominal:     General: Bowel sounds are normal. There is no distension.     Palpations: Abdomen is soft.  Musculoskeletal:        General: No deformity. Normal range of motion.     Cervical back: Normal range of motion and neck supple.  Skin:    General: Skin is warm and dry.     Findings: No erythema or rash.  Neurological:     Mental Status: He is alert and oriented to person, place, and time. Mental status is at baseline.     Cranial Nerves: No cranial nerve deficit.     Coordination: Coordination normal.  Psychiatric:        Mood and Affect: Mood normal.     LABORATORY DATA:  I have reviewed the data as listed Lab Results  Component Value Date   WBC 4.3 04/15/2020   HGB 10.4 (L) 04/15/2020   HCT 32.8 (L) 04/15/2020   MCV 94.5 04/15/2020   PLT 198 04/15/2020   Recent Labs    03/17/20 0806 03/31/20 0757 04/15/20 0833  NA 142 140 141  K 4.0 4.0 4.2  CL 108 108 108  CO2 _0 GLUCOSE 145* 132* 132*  BUN _1 CREATININE 0.91 1.10 0.91  CALCIUM 8.7* 9.0 9.0  GFRNONAA >60 >60 >60  GFRAA >60 >60 >60  PROT 6.3* 6.6 6.5  ALBUMIN 3.5 3.7 3.6  AST _0 ALT _1 ALKPHOS 64 62 58  BILITOT 0.7 0.6 0.6   Iron/TIBC/Ferritin/ %Sat    Component Value Date/Time   IRON 119 04/27/2019 0940   TIBC 363 04/27/2019 0940   FERRITIN 58 04/27/2019 0940   IRONPCTSAT 33 04/27/2019 0940      RADIOGRAPHIC STUDIES: I have personally  reviewed the radiological images as listed and agreed with the findings in the report. CT Chest W Contrast  Result Date: 04/23/2020 CLINICAL DATA:  Restaging metastatic pancreatic cancer. Interval chemotherapy. EXAM: CT CHEST, ABDOMEN, AND PELVIS WITH CONTRAST TECHNIQUE: Multidetector CT imaging of the chest, abdomen and pelvis was performed following the standard protocol during bolus administration of intravenous contrast. CONTRAST:  170m OMNIPAQUE IOHEXOL 300 MG/ML  SOLN COMPARISON:  01/02/2020 FINDINGS: CT CHEST FINDINGS Cardiovascular: Right Port-A-Cath tip: SVC. Coronary, aortic arch, and branch vessel atherosclerotic vascular disease. Prior CABG. Mediastinum/Nodes: Left infrahilar node 0.9 cm in short axis on image 30/2, previously the same by my measurements. Right paratracheal node 0.8 cm in short axis on image 13/2, previously 0.9 cm by my measurements. Lungs/Pleura: Ill-defined 0.5 cm subpleural nodule in the right upper lobe on image 66/3 likely corresponding to the slightly more solid-appearing 0.5 cm nodule shown on the prior exam. 3 mm subpleural nodule in the right upper lobe on image 67/3, stable. Stable 3 mm right middle lobe subpleural nodule. 0.5 cm right lower lobe subpleural nodule on image 102/3, stable. Other minimal subpleural nodularity noted on the right side, stable. A pleural mass along the left hemidiaphragm measures 2.1 cm in short axis thickness on image 52/4, previously the same. This was previously not hypermetabolic on 001/75/1025 Musculoskeletal: Thoracic spondylosis. Partial fusion of the T3-4 level including the posterior elements, likely congenital. Stable anterior wedge compression at T5. CT ABDOMEN PELVIS FINDINGS Hepatobiliary: Scattered hepatic metastatic lesions are present. Ill-defined margins, but roughly similar size and distribution to the 01/02/2020 exam. A index right hepatic lobe lesion measuring 2.2 cm transversely on image 49/2 is similar in overall size. Most  of the lesions have slightly less indistinct borders today compared to previous. Cholecystectomy.  No biliary dilatation noted. Pancreas: Reduced size of the pancreas potentially from atrophy or resolution of prior inflammation, I favor the former. Mildly prominent dorsal pancreatic duct extending to the somewhat prominent pancreatic head which appears to have accentuated activity compared to the rest of the pancreatic parenchyma. Presumed mass in the pancreatic head region measures proximally 3.6 by 2.8 cm on image 66/3, this was previously larger at about 4.2 by 3.8 cm on 01/02/2020. Admittedly the margins between this lesion and rest of the pancreas are difficult to discern, and I could be including some normal pancreatic tissue in these measurements. Spleen: Unremarkable Adrenals/Urinary Tract: Both adrenal glands appear normal. Bilateral renal cysts are identified. Superficial to a left mid kidney cyst on image 64/2, and more complex lesion measuring 1.5 by 0.8 cm is stable, and is probably a complex cyst based on the pre contrast hyperdensity shown on the PET-CT of 06/04/2019. Other potentially complex left renal cysts are present, and were likewise hyperdense on prior PET-CT. Urinary bladder unremarkable. Stomach/Bowel: Gastric wall thickening particularly along the greater curvature. This was present on prior PET-CT of 06/04/2019 as well, but not hypermetabolic, and may be primarily from nondistention. Vascular/Lymphatic: Aortoiliac atherosclerotic vascular disease. Portacaval node 0.9 cm in short axis on image 59/2, previously 1.0 cm by my  measurement. Reproductive: Borderline prostatomegaly. Other: No supplemental non-categorized findings. Musculoskeletal: Posterolateral rod and pedicle screw fixation with interbody spacer at the L4-5 level. No findings of osseous metastatic disease. IMPRESSION: 1. Overall similar appearance of the scattered hepatic metastatic lesions. Mass in the pancreatic head region is  moderately reduced in size compared to the prior exam. 2. Worsening pancreatic atrophy. 3. Stable pleural based mass along the left hemidiaphragm. 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 4. Several tiny subpleural pulmonary nodules are stable. 5. Gastric wall thickening particularly along the greater curvature, probably from nondistention. This was present but not hypermetabolic on prior PET-CT of 06/04/2019. 6. Bilateral renal cysts, of varying complexity on the left. 7. Aortic atherosclerosis. Aortic Atherosclerosis (ICD10-I70.0). Electronically Signed   By: Van Clines M.D.   On: 04/23/2020 15:01   CT Abdomen Pelvis W Contrast  Result Date: 04/23/2020 CLINICAL DATA:  Restaging metastatic pancreatic cancer. Interval chemotherapy. EXAM: CT CHEST, ABDOMEN, AND PELVIS WITH CONTRAST TECHNIQUE: Multidetector CT imaging of the chest, abdomen and pelvis was performed following the standard protocol during bolus administration of intravenous contrast. CONTRAST:  160m OMNIPAQUE IOHEXOL 300 MG/ML  SOLN COMPARISON:  01/02/2020 FINDINGS: CT CHEST FINDINGS Cardiovascular: Right Port-A-Cath tip: SVC. Coronary, aortic arch, and branch vessel atherosclerotic vascular disease. Prior CABG. Mediastinum/Nodes: Left infrahilar node 0.9 cm in short axis on image 30/2, previously the same by my measurements. Right paratracheal node 0.8 cm in short axis on image 13/2, previously 0.9 cm by my measurements. Lungs/Pleura: Ill-defined 0.5 cm subpleural nodule in the right upper lobe on image 66/3 likely corresponding to the slightly more solid-appearing 0.5 cm nodule shown on the prior exam. 3 mm subpleural nodule in the right upper lobe on image 67/3, stable. Stable 3 mm right middle lobe subpleural nodule. 0.5 cm right lower lobe subpleural nodule on image 102/3, stable. Other minimal subpleural nodularity noted on the right side, stable. A pleural mass along the  left hemidiaphragm measures 2.1 cm in short axis thickness on image 52/4, previously the same. This was previously not hypermetabolic on 021/19/4174 Musculoskeletal: Thoracic spondylosis. Partial fusion of the T3-4 level including the posterior elements, likely congenital. Stable anterior wedge compression at T5. CT ABDOMEN PELVIS FINDINGS Hepatobiliary: Scattered hepatic metastatic lesions are present. Ill-defined margins, but roughly similar size and distribution to the 01/02/2020 exam. A index right hepatic lobe lesion measuring 2.2 cm transversely on image 49/2 is similar in overall size. Most of the lesions have slightly less indistinct borders today compared to previous. Cholecystectomy.  No biliary dilatation noted. Pancreas: Reduced size of the pancreas potentially from atrophy or resolution of prior inflammation, I favor the former. Mildly prominent dorsal pancreatic duct extending to the somewhat prominent pancreatic head which appears to have accentuated activity compared to the rest of the pancreatic parenchyma. Presumed mass in the pancreatic head region measures proximally 3.6 by 2.8 cm on image 66/3, this was previously larger at about 4.2 by 3.8 cm on 01/02/2020. Admittedly the margins between this lesion and rest of the pancreas are difficult to discern, and I could be including some normal pancreatic tissue in these measurements. Spleen: Unremarkable Adrenals/Urinary Tract: Both adrenal glands appear normal. Bilateral renal cysts are identified. Superficial to a left mid kidney cyst on image 64/2, and more complex lesion measuring 1.5 by 0.8 cm is stable, and is probably a complex cyst based on the pre contrast hyperdensity shown on the PET-CT of 06/04/2019. Other potentially complex  left renal cysts are present, and were likewise hyperdense on prior PET-CT. Urinary bladder unremarkable. Stomach/Bowel: Gastric wall thickening particularly along the greater curvature. This was present on prior  PET-CT of 06/04/2019 as well, but not hypermetabolic, and may be primarily from nondistention. Vascular/Lymphatic: Aortoiliac atherosclerotic vascular disease. Portacaval node 0.9 cm in short axis on image 59/2, previously 1.0 cm by my measurement. Reproductive: Borderline prostatomegaly. Other: No supplemental non-categorized findings. Musculoskeletal: Posterolateral rod and pedicle screw fixation with interbody spacer at the L4-5 level. No findings of osseous metastatic disease. IMPRESSION: 1. Overall similar appearance of the scattered hepatic metastatic lesions. Mass in the pancreatic head region is moderately reduced in size compared to the prior exam. 2. Worsening pancreatic atrophy. 3. Stable pleural based mass along the left hemidiaphragm. 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 4. Several tiny subpleural pulmonary nodules are stable. 5. Gastric wall thickening particularly along the greater curvature, probably from nondistention. This was present but not hypermetabolic on prior PET-CT of 06/04/2019. 6. Bilateral renal cysts, of varying complexity on the left. 7. Aortic atherosclerosis. Aortic Atherosclerosis (ICD10-I70.0). Electronically Signed   By: Van Clines M.D.   On: 04/23/2020 15:01      ASSESSMENT & PLAN:  1. Pancreatic cancer metastasized to liver ([place])   2. Anemia due to antineoplastic chemotherapy   3. Encounter for antineoplastic chemotherapy   4. Hypokalemia   5. Neuropathy    #Metastatic pancreatic cancer- liver mets, hilar nodal metastasis, ?bone mets, CA 19-9 trending down to 6679.  Today's CA 19-9 is pending. Interval CT abdomen pelvis with contrast was independently reviewed by me and discussed with patient .  Overall similar appearance of scattered hepatic metastasis, mass in the pancreatic head region is moderately reduced in size comparing to prior examination. Worsening pancreatic atrophy.  Stable  pleural-based mass along the left hemidiaphragm-this was not hypermetabolic on prior PET scan, and may represent a solitary fibrous tumor.  Tiny subpleural pulmonary nodules are stable.  Gastric wall thickening-not previously hypermetabolic on PET scan.  Bilateral renal cysts.  Labs are reviewed and discussed with patient. Counts acceptable to proceed with cycle 9 5-FU and liposomal irinotecan treatment. #Chronic hypokalemia, K is stable at 4.2, continue potassium chloride 10 mEq daily.   #  diabetes/ hyperglycemia, hyperglycemia is better controlled now on insulin regimen.  Today's glucose level is 148. Continue follow-up with primary care provider #Neuropathy, continue gabapentin 639m TID. Discussed about acupuncture and he is not interested.  #Normocytic anemia, likely secondary to chemo.  Hemoglobin is 10.5 today stable.  Continue to monitor.   #All questions were answered. The patient knows to call the clinic with any problems questions or concerns. Follow-up  2 weeks.   ZEarlie Server MD, PhD [facility]7/20/2021

## 2020-04-30 LAB — CANCER ANTIGEN 19-9: CA 19-9: 8002 U/mL — ABNORMAL HIGH (ref 0–35)

## 2020-05-01 ENCOUNTER — Other Ambulatory Visit: Payer: Self-pay

## 2020-05-01 ENCOUNTER — Inpatient Hospital Stay: Payer: Medicare Other

## 2020-05-01 VITALS — BP 118/61 | HR 71 | Temp 97.0°F | Resp 18

## 2020-05-01 DIAGNOSIS — C259 Malignant neoplasm of pancreas, unspecified: Secondary | ICD-10-CM

## 2020-05-01 DIAGNOSIS — Z5111 Encounter for antineoplastic chemotherapy: Secondary | ICD-10-CM | POA: Diagnosis not present

## 2020-05-01 MED ORDER — HEPARIN SOD (PORK) LOCK FLUSH 100 UNIT/ML IV SOLN
500.0000 [IU] | Freq: Once | INTRAVENOUS | Status: AC | PRN
  Administered 2020-05-01: 500 [IU]
  Filled 2020-05-01: qty 5

## 2020-05-01 MED ORDER — HEPARIN SOD (PORK) LOCK FLUSH 100 UNIT/ML IV SOLN
INTRAVENOUS | Status: AC
  Filled 2020-05-01: qty 5

## 2020-05-01 MED ORDER — SODIUM CHLORIDE 0.9% FLUSH
10.0000 mL | INTRAVENOUS | Status: DC | PRN
  Administered 2020-05-01: 10 mL
  Filled 2020-05-01: qty 10

## 2020-05-13 ENCOUNTER — Inpatient Hospital Stay: Payer: Medicare Other | Attending: Oncology

## 2020-05-13 ENCOUNTER — Inpatient Hospital Stay: Payer: Medicare Other

## 2020-05-13 ENCOUNTER — Other Ambulatory Visit: Payer: Self-pay

## 2020-05-13 ENCOUNTER — Encounter: Payer: Self-pay | Admitting: Oncology

## 2020-05-13 ENCOUNTER — Inpatient Hospital Stay (HOSPITAL_BASED_OUTPATIENT_CLINIC_OR_DEPARTMENT_OTHER): Payer: Medicare Other | Admitting: Oncology

## 2020-05-13 VITALS — BP 134/72 | HR 79 | Temp 97.6°F | Resp 18 | Wt 177.0 lb

## 2020-05-13 DIAGNOSIS — E876 Hypokalemia: Secondary | ICD-10-CM | POA: Diagnosis not present

## 2020-05-13 DIAGNOSIS — Z79899 Other long term (current) drug therapy: Secondary | ICD-10-CM | POA: Insufficient documentation

## 2020-05-13 DIAGNOSIS — C771 Secondary and unspecified malignant neoplasm of intrathoracic lymph nodes: Secondary | ICD-10-CM | POA: Insufficient documentation

## 2020-05-13 DIAGNOSIS — I2510 Atherosclerotic heart disease of native coronary artery without angina pectoris: Secondary | ICD-10-CM | POA: Diagnosis not present

## 2020-05-13 DIAGNOSIS — C787 Secondary malignant neoplasm of liver and intrahepatic bile duct: Secondary | ICD-10-CM | POA: Diagnosis not present

## 2020-05-13 DIAGNOSIS — E1151 Type 2 diabetes mellitus with diabetic peripheral angiopathy without gangrene: Secondary | ICD-10-CM | POA: Diagnosis not present

## 2020-05-13 DIAGNOSIS — R197 Diarrhea, unspecified: Secondary | ICD-10-CM | POA: Insufficient documentation

## 2020-05-13 DIAGNOSIS — R5383 Other fatigue: Secondary | ICD-10-CM | POA: Insufficient documentation

## 2020-05-13 DIAGNOSIS — C7951 Secondary malignant neoplasm of bone: Secondary | ICD-10-CM | POA: Insufficient documentation

## 2020-05-13 DIAGNOSIS — R140 Abdominal distension (gaseous): Secondary | ICD-10-CM | POA: Diagnosis not present

## 2020-05-13 DIAGNOSIS — Z5111 Encounter for antineoplastic chemotherapy: Secondary | ICD-10-CM | POA: Diagnosis not present

## 2020-05-13 DIAGNOSIS — N400 Enlarged prostate without lower urinary tract symptoms: Secondary | ICD-10-CM | POA: Diagnosis not present

## 2020-05-13 DIAGNOSIS — Z808 Family history of malignant neoplasm of other organs or systems: Secondary | ICD-10-CM | POA: Insufficient documentation

## 2020-05-13 DIAGNOSIS — R200 Anesthesia of skin: Secondary | ICD-10-CM | POA: Diagnosis not present

## 2020-05-13 DIAGNOSIS — N281 Cyst of kidney, acquired: Secondary | ICD-10-CM | POA: Diagnosis not present

## 2020-05-13 DIAGNOSIS — G629 Polyneuropathy, unspecified: Secondary | ICD-10-CM

## 2020-05-13 DIAGNOSIS — E1140 Type 2 diabetes mellitus with diabetic neuropathy, unspecified: Secondary | ICD-10-CM | POA: Diagnosis not present

## 2020-05-13 DIAGNOSIS — Z95828 Presence of other vascular implants and grafts: Secondary | ICD-10-CM

## 2020-05-13 DIAGNOSIS — R160 Hepatomegaly, not elsewhere classified: Secondary | ICD-10-CM | POA: Insufficient documentation

## 2020-05-13 DIAGNOSIS — D6481 Anemia due to antineoplastic chemotherapy: Secondary | ICD-10-CM | POA: Insufficient documentation

## 2020-05-13 DIAGNOSIS — L271 Localized skin eruption due to drugs and medicaments taken internally: Secondary | ICD-10-CM | POA: Diagnosis not present

## 2020-05-13 DIAGNOSIS — Z87891 Personal history of nicotine dependence: Secondary | ICD-10-CM | POA: Insufficient documentation

## 2020-05-13 DIAGNOSIS — C259 Malignant neoplasm of pancreas, unspecified: Secondary | ICD-10-CM

## 2020-05-13 DIAGNOSIS — K219 Gastro-esophageal reflux disease without esophagitis: Secondary | ICD-10-CM | POA: Insufficient documentation

## 2020-05-13 DIAGNOSIS — R918 Other nonspecific abnormal finding of lung field: Secondary | ICD-10-CM | POA: Diagnosis not present

## 2020-05-13 DIAGNOSIS — Z836 Family history of other diseases of the respiratory system: Secondary | ICD-10-CM | POA: Insufficient documentation

## 2020-05-13 DIAGNOSIS — T451X5A Adverse effect of antineoplastic and immunosuppressive drugs, initial encounter: Secondary | ICD-10-CM | POA: Diagnosis not present

## 2020-05-13 DIAGNOSIS — R599 Enlarged lymph nodes, unspecified: Secondary | ICD-10-CM | POA: Diagnosis not present

## 2020-05-13 DIAGNOSIS — I110 Hypertensive heart disease with heart failure: Secondary | ICD-10-CM | POA: Diagnosis not present

## 2020-05-13 DIAGNOSIS — R109 Unspecified abdominal pain: Secondary | ICD-10-CM | POA: Diagnosis not present

## 2020-05-13 DIAGNOSIS — Z888 Allergy status to other drugs, medicaments and biological substances status: Secondary | ICD-10-CM | POA: Insufficient documentation

## 2020-05-13 DIAGNOSIS — Z809 Family history of malignant neoplasm, unspecified: Secondary | ICD-10-CM | POA: Insufficient documentation

## 2020-05-13 DIAGNOSIS — I700 Atherosclerosis of aorta: Secondary | ICD-10-CM | POA: Insufficient documentation

## 2020-05-13 DIAGNOSIS — Z9049 Acquired absence of other specified parts of digestive tract: Secondary | ICD-10-CM | POA: Insufficient documentation

## 2020-05-13 LAB — COMPREHENSIVE METABOLIC PANEL
ALT: 24 U/L (ref 0–44)
AST: 21 U/L (ref 15–41)
Albumin: 3.6 g/dL (ref 3.5–5.0)
Alkaline Phosphatase: 74 U/L (ref 38–126)
Anion gap: 10 (ref 5–15)
BUN: 19 mg/dL (ref 8–23)
CO2: 24 mmol/L (ref 22–32)
Calcium: 9 mg/dL (ref 8.9–10.3)
Chloride: 106 mmol/L (ref 98–111)
Creatinine, Ser: 1.07 mg/dL (ref 0.61–1.24)
GFR calc Af Amer: >60 mL/min (ref >60)
GFR calc non Af Amer: >60 mL/min (ref >60)
Glucose, Bld: 177 mg/dL — ABNORMAL HIGH (ref 70–99)
Potassium: 4.1 mmol/L (ref 3.5–5.1)
Sodium: 140 mmol/L (ref 135–145)
Total Bilirubin: 0.6 mg/dL (ref 0.3–1.2)
Total Protein: 6.5 g/dL (ref 6.5–8.1)

## 2020-05-13 LAB — CBC WITH DIFFERENTIAL/PLATELET
Abs Immature Granulocytes: 0.03 10*3/uL (ref 0.00–0.07)
Basophils Absolute: 0.1 10*3/uL (ref 0.0–0.1)
Basophils Relative: 1 %
Eosinophils Absolute: 0.2 10*3/uL (ref 0.0–0.5)
Eosinophils Relative: 3 %
HCT: 34.1 % — ABNORMAL LOW (ref 39.0–52.0)
Hemoglobin: 10.7 g/dL — ABNORMAL LOW (ref 13.0–17.0)
Immature Granulocytes: 1 %
Lymphocytes Relative: 16 %
Lymphs Abs: 0.8 10*3/uL (ref 0.7–4.0)
MCH: 29.6 pg (ref 26.0–34.0)
MCHC: 31.4 g/dL (ref 30.0–36.0)
MCV: 94.2 fL (ref 80.0–100.0)
Monocytes Absolute: 0.5 10*3/uL (ref 0.1–1.0)
Monocytes Relative: 9 %
Neutro Abs: 3.8 10*3/uL (ref 1.7–7.7)
Neutrophils Relative %: 70 %
Platelets: 191 10*3/uL (ref 150–400)
RBC: 3.62 MIL/uL — ABNORMAL LOW (ref 4.22–5.81)
RDW: 15.9 % — ABNORMAL HIGH (ref 11.5–15.5)
WBC: 5.4 10*3/uL (ref 4.0–10.5)
nRBC: 0 % (ref 0.0–0.2)

## 2020-05-13 MED ORDER — SODIUM CHLORIDE 0.9 % IV SOLN
2400.0000 mg/m2 | INTRAVENOUS | Status: DC
  Administered 2020-05-13: 4550 mg via INTRAVENOUS
  Filled 2020-05-13: qty 91

## 2020-05-13 MED ORDER — DULOXETINE HCL 30 MG PO CPEP
30.0000 mg | ORAL_CAPSULE | Freq: Every day | ORAL | 0 refills | Status: DC
Start: 2020-05-13 — End: 2020-05-27

## 2020-05-13 MED ORDER — HEPARIN SOD (PORK) LOCK FLUSH 100 UNIT/ML IV SOLN
500.0000 [IU] | Freq: Once | INTRAVENOUS | Status: DC
  Filled 2020-05-13: qty 5

## 2020-05-13 MED ORDER — SODIUM CHLORIDE 0.9 % IV SOLN
68.0000 mg/m2 | Freq: Once | INTRAVENOUS | Status: AC
  Administered 2020-05-13: 129 mg via INTRAVENOUS
  Filled 2020-05-13: qty 30

## 2020-05-13 MED ORDER — SODIUM CHLORIDE 0.9% FLUSH
10.0000 mL | Freq: Once | INTRAVENOUS | Status: AC
  Administered 2020-05-13: 10 mL via INTRAVENOUS
  Filled 2020-05-13: qty 10

## 2020-05-13 MED ORDER — SODIUM CHLORIDE 0.9 % IV SOLN
10.0000 mg | Freq: Once | INTRAVENOUS | Status: AC
  Administered 2020-05-13: 10 mg via INTRAVENOUS
  Filled 2020-05-13: qty 10

## 2020-05-13 MED ORDER — PALONOSETRON HCL INJECTION 0.25 MG/5ML
0.2500 mg | Freq: Once | INTRAVENOUS | Status: AC
  Administered 2020-05-13: 0.25 mg via INTRAVENOUS
  Filled 2020-05-13: qty 5

## 2020-05-13 MED ORDER — SODIUM CHLORIDE 0.9 % IV SOLN
421.0000 mg/m2 | Freq: Once | INTRAVENOUS | Status: AC
  Administered 2020-05-13: 800 mg via INTRAVENOUS
  Filled 2020-05-13: qty 25

## 2020-05-13 MED ORDER — SODIUM CHLORIDE 0.9 % IV SOLN
Freq: Once | INTRAVENOUS | Status: AC
  Filled 2020-05-13: qty 250

## 2020-05-13 NOTE — Progress Notes (Signed)
Last week patient was having loose stools along with a lot bloating and gas after eating.  This has improved this week.

## 2020-05-13 NOTE — Progress Notes (Signed)
Hematology/Oncology  Follow up note [facility] [phone] [phone]   Patient Care Team: Paulene Floor as PCP - General (Physician Assistant) Clent Jacks, RN as Oncology Nurse Navigator Earlie Server, MD as Consulting Physician (Hematology and Oncology) Neldon Labella, RN as Case Manager Lonia Farber, MD as Consulting Physician (Internal Medicine)  REFERRING PROVIDER: Trinna Post, PA-C  CHIEF COMPLAINTS/REASON FOR VISIT:  Follow up for treatment of pancreatic cancer  HISTORY OF PRESENTING ILLNESS:   Craig [name] is a  74 y.o.  male with PMH listed below, including CABG x5, PVD, hypertension, former tobacco abuse, former alcohol abuse, iron deficiency anemia, was seen in consultation at the request of  Terrilee Croak, Adriana M, PA-C  for evaluation of abnormal CT Patient recently presented to primary care provider complaining for abdominal pain radiating to his back for about 10 days.  Patient uses Tylenol as needed for pain. Work-up showed elevated amylase, lipase, mild anemia with hemoglobin of 11.3 Acute hepatitis panel negative. 05/16/2019 CT abdomen pelvis with contrast showed poorly marginated heterogeneous hypodense 2.9 cm pancreatic mass at the uncinate process, worrisome for primary pancreatic cancer.  No biliary or pancreatic duct dilation. Several ill defined hypodense liver masses scattered throughout the liver, largest 3.1 cm in the segment 6 right liver lobe. Nonspecific portacaval and aortocaval adenopathy. Extreme medial left lung base 4.4 cm pleural-based mass probably a pleural metastasis.  Additional tiny pulmonary nodules scattered at the right lung base are indeterminate. Mild prostate or megaly  He had a history of 37.5-pack-year smoking history quitting 2018. No longer drinking alcohol.  # ultrasound-guided liver biopsy on 05/21/2019.  Pathology showed fragments of benign hepatic parenchyma showing  minimal macro vascular steatosis, otherwise no significant histo pathologic change. Single core of renal tissue showing approximately 25% glomerulosclerosis  # #History of CAD, status post CABG x5.  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  # Omniseq test showed PD-L1 50% TPS, TMB high, negative for BRCA1/2 or NTRK fusion.  MSI could not be completed.  Patient declines genetic testing # 06/07/2019 -1st line chemotherapy Gemcitabine and abraxane. 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 # 11/14/2019- progression, 2nd line treatment with keytruda # 01/07/2020 -progression, start third line chemotherapy treatment 5-FU with liposomal irinotecan 01/24/2020-01/26/2020 patient was admitted to ICU due to DKA. on long-acting insulin 20 units at night and takes sliding scale with meals.  INTERVAL HISTORY [name] is a 74 y.o. male who has above history reviewed by me today presents for evaluation of chemotherapy tolerability for metastatic pancreatic cancer.   Problems and complaints are listed below: Patient is on third line chemotherapy treatments and tolerates well., mostly in his feet.  Patient tolerates chemotherapy He reports episode of small amount of loose stools and bloating which has spontaneously resolved.  No diarrhea today. 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  He feels that symptoms persist. Patient has no new complaints today.  No nausea vomiting or diarrhea.  Appetite is good weight is stable. .. Review of Systems  Constitutional: Positive for fatigue. Negative for appetite change, chills, fever and unexpected weight change.  HENT:   Negative for hearing loss and voice change.   Eyes: Negative for eye problems and icterus.  Respiratory: Negative for chest tightness, cough and shortness of breath.   Cardiovascular: Negative  for chest pain and leg swelling.  Gastrointestinal: Negative for abdominal distention and abdominal pain.  Endocrine: Negative for hot flashes.  Genitourinary: Negative for difficulty urinating, dysuria and frequency.   Musculoskeletal: Negative for arthralgias.  Skin: Negative for itching and rash.  Neurological: Positive for numbness. Negative for light-headedness.  Hematological: Negative for adenopathy. Does not bruise/bleed easily.  Psychiatric/Behavioral: Negative for confusion.    MEDICAL HISTORY:  Past Medical History:  Diagnosis Date  . Allergic rhinitis   . CHF (congestive heart failure) ([place])   . Chronic cholecystitis   . Coronary artery disease   . DDD (degenerative disc disease), cervical   . Dehydration 06/21/2019  . Diabetes mellitus without complication ([place])   . Dyspnea   . Early cataract   . Erectile dysfunction   . GERD (gastroesophageal reflux disease)   . Gouty arthritis   . Headache    occasional migraines  . Hypercalcemia   . Hyperlipemia   . Hypertension   . Palpitations   . Pancreatic cancer metastasized to liver ([place]) 05/2019   Chemo tx's  . Peripheral vascular disease ([place])   . 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   LIMA to LAD SVG to [place] SVG SEQUENTIALLY to OM1 and OM2 SVG to ACUTE MARGINAL  . Spinal stenosis of cervical region   . Vitamin D deficiency     SURGICAL HISTORY: Past Surgical History:  Procedure Laterality Date  . BACK SURGERY  2018    fusion with screws  . CHOLECYSTECTOMY N/A 11/13/2018   Procedure: LAPAROSCOPIC CHOLECYSTECTOMY;  Surgeon: Vickie Epley, MD;  Location: [facility];  Service: General;  Laterality: N/A;  . COLONOSCOPY    . CORONARY ARTERY BYPASS GRAFT N/A 09/30/2017   Procedure: CORONARY ARTERY BYPASS GRAFTING times five using right and left Saphaneous vein harvested endoscopicly  and left internal mammary artery. (CABG),TEE;  Surgeon: Rexene Alberts, MD;  Location: [facility];  Service: Open Heart Surgery;   Laterality: N/A;  . LEFT HEART CATH AND CORONARY ANGIOGRAPHY Left 09/06/2017   Procedure: LEFT HEART CATH AND CORONARY ANGIOGRAPHY;  Surgeon: Corey Skains, MD;  Location: [facility];  Service: Cardiovascular;  Laterality: Left;  . percutaneous transluminal balloon angioplasty  01/2010   of left lower extremity  . PORTA CATH INSERTION N/A 06/06/2019   Procedure: PORTA CATH INSERTION;  Surgeon: Katha Cabal, MD;  Location: [facility];  Service: Cardiovascular;  Laterality: N/A;  . TEE WITHOUT CARDIOVERSION N/A 09/30/2017   Procedure: TRANSESOPHAGEAL ECHOCARDIOGRAM (TEE);  Surgeon: Rexene Alberts, MD;  Location: [facility] [place] [facility];  Service: Open Heart Surgery;  Laterality: N/A;  . VASCULAR SURGERY Left 2010   left exernal iliac and superficial femoral artery PTCA and stenting    SOCIAL HISTORY: Social History   Socioeconomic History  . Marital status: Married    Spouse name: Enid Derry  . Number of children: 2  . Years of education: Not on file  . Highest education level: 5th grade  Occupational History  . Occupation: drove tractors    Comment: retired  Tobacco Use  . Smoking status: Former Smoker    Packs/day: 0.75    Years: 50.00    Pack years: 37.50    Types: Cigarettes    Quit date: 09/27/2017    Years since quitting: 2.6  . Smokeless tobacco: Former Systems developer    Types: Secondary school teacher  . Vaping Use: Never used  Substance and Sexual Activity  . Alcohol use: Not Currently    Comment: stopped drinking before cabg  .  Drug use: Not Currently    Types: Marijuana    Comment: stopped smoking before CABG  . Sexual activity: Not Currently  Other Topics Concern  . Not on file  Social History Narrative  . Not on file   Social Determinants of Health   Financial Resource Strain: Low Risk   . Difficulty of Paying Living Expenses: Not hard at all  Food Insecurity: No Food Insecurity  . Worried About Charity fundraiser in the Last Year: Never true  . Ran Out of  Food in the Last Year: Never true  Transportation Needs: No Transportation Needs  . Lack of Transportation (Medical): No  . Lack of Transportation (Non-Medical): No  Physical Activity: Inactive  . Days of Exercise per Week: 0 days  . Minutes of Exercise per Session: 0 min  Stress: No Stress Concern Present  . Feeling of Stress : Not at all  Social Connections: Moderately Isolated  . Frequency of Communication with Friends and Family: More than three times a week  . Frequency of Social Gatherings with Friends and Family: More than three times a week  . Attends Religious Services: Never  . Active Member of Clubs or Organizations: No  . Attends Archivist Meetings: Never  . Marital Status: Married  Human resources officer Violence: Not At Risk  . Fear of Current or Ex-Partner: No  . Emotionally Abused: No  . Physically Abused: No  . Sexually Abused: No    FAMILY HISTORY: Family History  Problem Relation Age of Onset  . Brain cancer Mother   . Emphysema Father   . Cancer Brother     ALLERGIES:  is allergic to lipitor [atorvastatin], zetia [ezetimibe], lisinopril, protonix [pantoprazole], and spironolactone.  MEDICATIONS:  Current Outpatient Medications  Medication Sig Dispense Refill  . aspirin EC 81 MG tablet Take 81 mg by mouth daily.    . carvedilol (COREG) 6.25 MG tablet Take 6.25 mg by mouth 2 (two) times daily with a meal.  3  . Continuous Blood Gluc Receiver (FREESTYLE LIBRE 14 DAY READER) DEVI 1 Device by Does not apply route every 14 (fourteen) days. 6 each 1  . diltiazem (CARDIZEM CD) 300 MG 24 hr capsule TAKE 1 CAPSULE BY MOUTH EVERY DAY 90 capsule 1  . dorzolamide-timolol (COSOPT) 22.3-6.8 MG/ML ophthalmic solution INSTILL ONE DROP INTO BOTH EYES TWICE DAILY    . ferrous sulfate 325 (65 FE) MG tablet TAKE 1 TABLET BY MOUTH EVERY DAY WITH BREAKFAST 90 tablet 1  . gabapentin (NEURONTIN) 300 MG capsule TAKE 2 CAPSULES (600 MG TOTAL) BY MOUTH 3 (THREE) TIMES DAILY.  540 capsule 0  . ibuprofen (ADVIL) 800 MG tablet Take 1 tablet (800 mg total) by mouth every 8 (eight) hours as needed. 30 tablet 0  . insulin glargine (LANTUS SOLOSTAR) 100 UNIT/ML Solostar Pen Inject 20 Units into the skin at bedtime. (Patient taking differently: Inject 16 Units into the skin at bedtime. ) 15 mL 0  . insulin lispro (HUMALOG) 100 UNIT/ML KwikPen Inject 0.05 mLs (5 Units total) into the skin 3 (three) times daily with meals. (Patient not taking: Reported on 04/15/2020) 15 mL 11  . Insulin Pen Needle (PEN NEEDLES) 32G X 5 MM MISC Use four times daily for insulin 400 each 1  . latanoprost (XALATAN) 0.005 % ophthalmic solution Place 1 drop into both eyes at bedtime.   3  . lidocaine-prilocaine (EMLA) cream APPLY TO AFFECTED AREA ONCE AS DIRECTED    . loperamide (IMODIUM A-D) 2  MG tablet Take 1 tablet (2 mg total) by mouth See admin instructions. Take 2 at diarrhea onset , then 1 every 2hr until 12hrs with no BM. May take 2 every 4hrs at night. If diarrhea recurs repeat. (Patient not taking: Reported on 04/29/2020) 100 tablet 1  . Multiple Vitamin (MULTIVITAMIN WITH MINERALS) TABS tablet Take 1 tablet by mouth daily.    . OMEGA-3 FATTY ACIDS PO Take 1,200 mg by mouth daily.     [name] Kitchen omeprazole (PRILOSEC) 40 MG capsule TAKE 1 CAPSULE BY MOUTH EVERY DAY 90 capsule 1  . ONETOUCH VERIO test strip TEST FASTING SUGAR EVERY MORNING 100 strip 3  . oxyCODONE (OXY IR/ROXICODONE) 5 MG immediate release tablet Take 1 tablet (5 mg total) by mouth every 6 (six) hours as needed for severe pain. (Patient not taking: Reported on 04/15/2020) 30 tablet 0  . potassium chloride (KLOR-CON) 10 MEQ tablet TAKE 1 TABLET BY MOUTH EVERY DAY 30 tablet 2  . rosuvastatin (CRESTOR) 10 MG tablet Take 1 tablet by mouth daily.    [name] Kitchen triamcinolone ointment (KENALOG) 0.5 % APPLY 1 APPLICATION TOPICALLY 3 (THREE) TIMES DAILY. 30 g 0   No current facility-administered medications for this visit.   Facility-Administered Medications  Ordered in Other Visits  Medication Dose Route Frequency Provider Last Rate Last Admin  . heparin lock flush 100 unit/mL  500 Units Intravenous Once Earlie Server, MD      . sodium chloride flush (NS) 0.9 % injection 10 mL  10 mL Intracatheter PRN Earlie Server, MD      . sodium chloride flush (NS) 0.9 % injection 10 mL  10 mL Intravenous Once Earlie Server, MD         PHYSICAL EXAMINATION: ECOG PERFORMANCE STATUS: 1 - Symptomatic but completely ambulatory Vitals:   05/13/20 0845  BP: 134/72  Pulse: 79  Resp: 18  Temp: 97.6 F (36.4 C)   There were no vitals filed for this visit.  Physical Exam Constitutional:      General: He is not in acute distress. HENT:     Head: Normocephalic and atraumatic.  Eyes:     General: No scleral icterus. Cardiovascular:     Rate and Rhythm: Normal rate and regular rhythm.     Heart sounds: Normal heart sounds.  Pulmonary:     Effort: Pulmonary effort is normal. No respiratory distress.     Breath sounds: No wheezing.  Abdominal:     General: Bowel sounds are normal. There is no distension.     Palpations: Abdomen is soft.  Musculoskeletal:        General: No deformity. Normal range of motion.     Cervical back: Normal range of motion and neck supple.  Skin:    General: Skin is warm and dry.     Findings: No erythema or rash.  Neurological:     Mental Status: He is alert and oriented to person, place, and time. Mental status is at baseline.     Cranial Nerves: No cranial nerve deficit.     Coordination: Coordination normal.  Psychiatric:        Mood and Affect: Mood normal.     LABORATORY DATA:  I have reviewed the data as listed Lab Results  Component Value Date   WBC 4.6 04/29/2020   HGB 10.5 (L) 04/29/2020   HCT 32.5 (L) 04/29/2020   MCV 94.2 04/29/2020   PLT 180 04/29/2020   Recent Labs    03/31/20 0757 04/15/20 0833 04/29/20  0809  NA 140 141 140  K 4.0 4.2 4.2  CL 108 108 109  CO2 '23 25 24  '$ GLUCOSE 132* 132* 148*  BUN '18 18  21  '$ CREATININE 1.10 0.91 1.09  CALCIUM 9.0 9.0 9.1  GFRNONAA >60 >60 >60  GFRAA >60 >60 >60  PROT 6.6 6.5 6.4*  ALBUMIN 3.7 3.6 3.6  AST '16 18 18  '$ ALT '16 16 17  '$ ALKPHOS 62 58 64  BILITOT 0.6 0.6 0.6   Iron/TIBC/Ferritin/ %Sat    Component Value Date/Time   IRON 119 04/27/2019 0940   TIBC 363 04/27/2019 0940   FERRITIN 58 04/27/2019 0940   IRONPCTSAT 33 04/27/2019 0940      RADIOGRAPHIC STUDIES: I have personally reviewed the radiological images as listed and agreed with the findings in the report. CT Chest W Contrast  Result Date: 04/23/2020 CLINICAL DATA:  Restaging metastatic pancreatic cancer. Interval chemotherapy. EXAM: CT CHEST, ABDOMEN, AND PELVIS WITH CONTRAST TECHNIQUE: Multidetector CT imaging of the chest, abdomen and pelvis was performed following the standard protocol during bolus administration of intravenous contrast. CONTRAST:  139m OMNIPAQUE IOHEXOL 300 MG/ML  SOLN COMPARISON:  01/02/2020 FINDINGS: CT CHEST FINDINGS Cardiovascular: Right Port-A-Cath tip: SVC. Coronary, aortic arch, and branch vessel atherosclerotic vascular disease. Prior CABG. Mediastinum/Nodes: Left infrahilar node 0.9 cm in short axis on image 30/2, previously the same by my measurements. Right paratracheal node 0.8 cm in short axis on image 13/2, previously 0.9 cm by my measurements. Lungs/Pleura: Ill-defined 0.5 cm subpleural nodule in the right upper lobe on image 66/3 likely corresponding to the slightly more solid-appearing 0.5 cm nodule shown on the prior exam. 3 mm subpleural nodule in the right upper lobe on image 67/3, stable. Stable 3 mm right middle lobe subpleural nodule. 0.5 cm right lower lobe subpleural nodule on image 102/3, stable. Other minimal subpleural nodularity noted on the right side, stable. A pleural mass along the left hemidiaphragm measures 2.1 cm in short axis thickness on image 52/4, previously the same. This was previously not hypermetabolic on 067/20/9470 Musculoskeletal:  Thoracic spondylosis. Partial fusion of the T3-4 level including the posterior elements, likely congenital. Stable anterior wedge compression at T5. CT ABDOMEN PELVIS FINDINGS Hepatobiliary: Scattered hepatic metastatic lesions are present. Ill-defined margins, but roughly similar size and distribution to the 01/02/2020 exam. A index right hepatic lobe lesion measuring 2.2 cm transversely on image 49/2 is similar in overall size. Most of the lesions have slightly less indistinct borders today compared to previous. Cholecystectomy.  No biliary dilatation noted. Pancreas: Reduced size of the pancreas potentially from atrophy or resolution of prior inflammation, I favor the former. Mildly prominent dorsal pancreatic duct extending to the somewhat prominent pancreatic head which appears to have accentuated activity compared to the rest of the pancreatic parenchyma. Presumed mass in the pancreatic head region measures proximally 3.6 by 2.8 cm on image 66/3, this was previously larger at about 4.2 by 3.8 cm on 01/02/2020. Admittedly the margins between this lesion and rest of the pancreas are difficult to discern, and I could be including some normal pancreatic tissue in these measurements. Spleen: Unremarkable Adrenals/Urinary Tract: Both adrenal glands appear normal. Bilateral renal cysts are identified. Superficial to a left mid kidney cyst on image 64/2, and more complex lesion measuring 1.5 by 0.8 cm is stable, and is probably a complex cyst based on the pre contrast hyperdensity shown on the PET-CT of 06/04/2019. Other potentially complex left renal cysts are present, and were likewise hyperdense on prior  PET-CT. Urinary bladder unremarkable. Stomach/Bowel: Gastric wall thickening particularly along the greater curvature. This was present on prior PET-CT of 06/04/2019 as well, but not hypermetabolic, and may be primarily from nondistention. Vascular/Lymphatic: Aortoiliac atherosclerotic vascular disease. Portacaval  node 0.9 cm in short axis on image 59/2, previously 1.0 cm by my measurement. Reproductive: Borderline prostatomegaly. Other: No supplemental non-categorized findings. Musculoskeletal: Posterolateral rod and pedicle screw fixation with interbody spacer at the L4-5 level. No findings of osseous metastatic disease. IMPRESSION: 1. Overall similar appearance of the scattered hepatic metastatic lesions. Mass in the pancreatic head region is moderately reduced in size compared to the prior exam. 2. Worsening pancreatic atrophy. 3. Stable pleural based mass along the left hemidiaphragm. 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 4. Several tiny subpleural pulmonary nodules are stable. 5. Gastric wall thickening particularly along the greater curvature, probably from nondistention. This was present but not hypermetabolic on prior PET-CT of 06/04/2019. 6. Bilateral renal cysts, of varying complexity on the left. 7. Aortic atherosclerosis. Aortic Atherosclerosis (ICD10-I70.0). Electronically Signed   By: Van Clines M.D.   On: 04/23/2020 15:01   CT Abdomen Pelvis W Contrast  Result Date: 04/23/2020 CLINICAL DATA:  Restaging metastatic pancreatic cancer. Interval chemotherapy. EXAM: CT CHEST, ABDOMEN, AND PELVIS WITH CONTRAST TECHNIQUE: Multidetector CT imaging of the chest, abdomen and pelvis was performed following the standard protocol during bolus administration of intravenous contrast. CONTRAST:  174m OMNIPAQUE IOHEXOL 300 MG/ML  SOLN COMPARISON:  01/02/2020 FINDINGS: CT CHEST FINDINGS Cardiovascular: Right Port-A-Cath tip: SVC. Coronary, aortic arch, and branch vessel atherosclerotic vascular disease. Prior CABG. Mediastinum/Nodes: Left infrahilar node 0.9 cm in short axis on image 30/2, previously the same by my measurements. Right paratracheal node 0.8 cm in short axis on image 13/2, previously 0.9 cm by my measurements. Lungs/Pleura: Ill-defined  0.5 cm subpleural nodule in the right upper lobe on image 66/3 likely corresponding to the slightly more solid-appearing 0.5 cm nodule shown on the prior exam. 3 mm subpleural nodule in the right upper lobe on image 67/3, stable. Stable 3 mm right middle lobe subpleural nodule. 0.5 cm right lower lobe subpleural nodule on image 102/3, stable. Other minimal subpleural nodularity noted on the right side, stable. A pleural mass along the left hemidiaphragm measures 2.1 cm in short axis thickness on image 52/4, previously the same. This was previously not hypermetabolic on 093/71/6967 Musculoskeletal: Thoracic spondylosis. Partial fusion of the T3-4 level including the posterior elements, likely congenital. Stable anterior wedge compression at T5. CT ABDOMEN PELVIS FINDINGS Hepatobiliary: Scattered hepatic metastatic lesions are present. Ill-defined margins, but roughly similar size and distribution to the 01/02/2020 exam. A index right hepatic lobe lesion measuring 2.2 cm transversely on image 49/2 is similar in overall size. Most of the lesions have slightly less indistinct borders today compared to previous. Cholecystectomy.  No biliary dilatation noted. Pancreas: Reduced size of the pancreas potentially from atrophy or resolution of prior inflammation, I favor the former. Mildly prominent dorsal pancreatic duct extending to the somewhat prominent pancreatic head which appears to have accentuated activity compared to the rest of the pancreatic parenchyma. Presumed mass in the pancreatic head region measures proximally 3.6 by 2.8 cm on image 66/3, this was previously larger at about 4.2 by 3.8 cm on 01/02/2020. Admittedly the margins between this lesion and rest of the pancreas are difficult to discern, and I could be including some normal pancreatic tissue in these measurements. Spleen: Unremarkable Adrenals/Urinary Tract:  Both adrenal glands appear normal. Bilateral renal cysts are identified. Superficial to a left  mid kidney cyst on image 64/2, and more complex lesion measuring 1.5 by 0.8 cm is stable, and is probably a complex cyst based on the pre contrast hyperdensity shown on the PET-CT of 06/04/2019. Other potentially complex left renal cysts are present, and were likewise hyperdense on prior PET-CT. Urinary bladder unremarkable. Stomach/Bowel: Gastric wall thickening particularly along the greater curvature. This was present on prior PET-CT of 06/04/2019 as well, but not hypermetabolic, and may be primarily from nondistention. Vascular/Lymphatic: Aortoiliac atherosclerotic vascular disease. Portacaval node 0.9 cm in short axis on image 59/2, previously 1.0 cm by my measurement. Reproductive: Borderline prostatomegaly. Other: No supplemental non-categorized findings. Musculoskeletal: Posterolateral rod and pedicle screw fixation with interbody spacer at the L4-5 level. No findings of osseous metastatic disease. IMPRESSION: 1. Overall similar appearance of the scattered hepatic metastatic lesions. Mass in the pancreatic head region is moderately reduced in size compared to the prior exam. 2. Worsening pancreatic atrophy. 3. Stable pleural based mass along the left hemidiaphragm. 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 4. Several tiny subpleural pulmonary nodules are stable. 5. Gastric wall thickening particularly along the greater curvature, probably from nondistention. This was present but not hypermetabolic on prior PET-CT of 06/04/2019. 6. Bilateral renal cysts, of varying complexity on the left. 7. Aortic atherosclerosis. Aortic Atherosclerosis (ICD10-I70.0). Electronically Signed   By: Van Clines M.D.   On: 04/23/2020 15:01      ASSESSMENT & PLAN:  1. Pancreatic cancer metastasized to liver ([place])   2. Anemia due to antineoplastic chemotherapy   3. Encounter for antineoplastic chemotherapy   4. Neuropathy   5. Palmar plantar  erythrodysesthesia    #Metastatic pancreatic cancer- liver mets, hilar nodal metastasis, ?bone mets, CA 19-9 nadir at 6679, now trend up again.  04/23/2020 CT abdomen pelvis showed partial response.  Continue current regimen.  Labs are reviewed and discussed with patient.  Proceed with 5-FU and liposomal irinotecan. Short-term CT follow-up.  #Chronic hypokalemia, K is stable at 4.1, continue potassium chloride 10 mEq daily.   #  diabetes/ hyperglycemia, hyperglycemia is better controlled now on insulin regimen.  Today's glucose level is 177 Continue follow-up with primary care provider #Neuropathy, continue gabapentin '600mg'$  TID.  He is not interested in acupuncture.  I will add Cymbalta 30 mg daily.  Advised patient to gradually taper gabapentin off. #Normocytic anemia, likely secondary to chemo.  Hemoglobin is 10.7 today stable.  Continue to monitor.  #All questions were answered. The patient knows to call the clinic with any problems questions or concerns. Follow-up  2 weeks.   Earlie Server, MD, PhD Hematology Oncology [place] [facility] 05/13/2020

## 2020-05-15 ENCOUNTER — Inpatient Hospital Stay: Payer: Medicare Other

## 2020-05-15 ENCOUNTER — Other Ambulatory Visit: Payer: Self-pay

## 2020-05-15 DIAGNOSIS — C259 Malignant neoplasm of pancreas, unspecified: Secondary | ICD-10-CM

## 2020-05-15 DIAGNOSIS — Z5111 Encounter for antineoplastic chemotherapy: Secondary | ICD-10-CM | POA: Diagnosis not present

## 2020-05-15 MED ORDER — SODIUM CHLORIDE 0.9% FLUSH
10.0000 mL | INTRAVENOUS | Status: DC | PRN
  Administered 2020-05-15: 10 mL
  Filled 2020-05-15: qty 10

## 2020-05-15 MED ORDER — HEPARIN SOD (PORK) LOCK FLUSH 100 UNIT/ML IV SOLN
500.0000 [IU] | Freq: Once | INTRAVENOUS | Status: AC | PRN
  Administered 2020-05-15: 500 [IU]
  Filled 2020-05-15: qty 5

## 2020-05-19 ENCOUNTER — Telehealth: Payer: Self-pay

## 2020-05-19 NOTE — Telephone Encounter (Signed)
  Chronic Care Management   Outreach Note  05/19/2020 Name: Craig [name] MRN: [id number] DOB: 05-21-1946  Primary Care Provider: Trinna Post, PA-C Reason for referral : Chronic Care Management   An unsuccessful telephone outreach was attempted today. The care management team will attempt to reach Mr. Sahagun again within the next two weeks.     [facility] [phone]

## 2020-05-27 ENCOUNTER — Inpatient Hospital Stay: Payer: Medicare Other

## 2020-05-27 ENCOUNTER — Inpatient Hospital Stay (HOSPITAL_BASED_OUTPATIENT_CLINIC_OR_DEPARTMENT_OTHER): Payer: Medicare Other | Admitting: Oncology

## 2020-05-27 ENCOUNTER — Encounter: Payer: Self-pay | Admitting: Oncology

## 2020-05-27 ENCOUNTER — Other Ambulatory Visit: Payer: Self-pay

## 2020-05-27 VITALS — BP 121/80 | HR 77 | Temp 97.6°F | Resp 20 | Wt 171.9 lb

## 2020-05-27 DIAGNOSIS — G629 Polyneuropathy, unspecified: Secondary | ICD-10-CM | POA: Diagnosis not present

## 2020-05-27 DIAGNOSIS — T451X5A Adverse effect of antineoplastic and immunosuppressive drugs, initial encounter: Secondary | ICD-10-CM

## 2020-05-27 DIAGNOSIS — C259 Malignant neoplasm of pancreas, unspecified: Secondary | ICD-10-CM | POA: Diagnosis not present

## 2020-05-27 DIAGNOSIS — D6481 Anemia due to antineoplastic chemotherapy: Secondary | ICD-10-CM | POA: Diagnosis not present

## 2020-05-27 DIAGNOSIS — Z5111 Encounter for antineoplastic chemotherapy: Secondary | ICD-10-CM | POA: Diagnosis not present

## 2020-05-27 DIAGNOSIS — C787 Secondary malignant neoplasm of liver and intrahepatic bile duct: Secondary | ICD-10-CM

## 2020-05-27 LAB — CBC WITH DIFFERENTIAL/PLATELET
Abs Immature Granulocytes: 0.02 10*3/uL (ref 0.00–0.07)
Basophils Absolute: 0.1 10*3/uL (ref 0.0–0.1)
Basophils Relative: 1 %
Eosinophils Absolute: 0.2 10*3/uL (ref 0.0–0.5)
Eosinophils Relative: 4 %
HCT: 34.1 % — ABNORMAL LOW (ref 39.0–52.0)
Hemoglobin: 11.2 g/dL — ABNORMAL LOW (ref 13.0–17.0)
Immature Granulocytes: 0 %
Lymphocytes Relative: 15 %
Lymphs Abs: 0.8 10*3/uL (ref 0.7–4.0)
MCH: 30.4 pg (ref 26.0–34.0)
MCHC: 32.8 g/dL (ref 30.0–36.0)
MCV: 92.4 fL (ref 80.0–100.0)
Monocytes Absolute: 0.6 10*3/uL (ref 0.1–1.0)
Monocytes Relative: 10 %
Neutro Abs: 3.7 10*3/uL (ref 1.7–7.7)
Neutrophils Relative %: 70 %
Platelets: 192 10*3/uL (ref 150–400)
RBC: 3.69 MIL/uL — ABNORMAL LOW (ref 4.22–5.81)
RDW: 15.6 % — ABNORMAL HIGH (ref 11.5–15.5)
WBC: 5.3 10*3/uL (ref 4.0–10.5)
nRBC: 0 % (ref 0.0–0.2)

## 2020-05-27 LAB — COMPREHENSIVE METABOLIC PANEL
ALT: 46 U/L — ABNORMAL HIGH (ref 0–44)
AST: 37 U/L (ref 15–41)
Albumin: 3.7 g/dL (ref 3.5–5.0)
Alkaline Phosphatase: 80 U/L (ref 38–126)
Anion gap: 8 (ref 5–15)
BUN: 25 mg/dL — ABNORMAL HIGH (ref 8–23)
CO2: 24 mmol/L (ref 22–32)
Calcium: 9.1 mg/dL (ref 8.9–10.3)
Chloride: 107 mmol/L (ref 98–111)
Creatinine, Ser: 1.29 mg/dL — ABNORMAL HIGH (ref 0.61–1.24)
GFR calc Af Amer: >60 mL/min (ref >60)
GFR calc non Af Amer: 55 mL/min — ABNORMAL LOW (ref >60)
Glucose, Bld: 155 mg/dL — ABNORMAL HIGH (ref 70–99)
Potassium: 4 mmol/L (ref 3.5–5.1)
Sodium: 139 mmol/L (ref 135–145)
Total Bilirubin: 0.7 mg/dL (ref 0.3–1.2)
Total Protein: 6.8 g/dL (ref 6.5–8.1)

## 2020-05-27 MED ORDER — SODIUM CHLORIDE 0.9% FLUSH
10.0000 mL | Freq: Once | INTRAVENOUS | Status: AC
  Administered 2020-05-27: 10 mL
  Filled 2020-05-27: qty 10

## 2020-05-27 MED ORDER — SODIUM CHLORIDE 0.9 % IV SOLN
68.0000 mg/m2 | Freq: Once | INTRAVENOUS | Status: AC
  Administered 2020-05-27: 129 mg via INTRAVENOUS
  Filled 2020-05-27: qty 30

## 2020-05-27 MED ORDER — SODIUM CHLORIDE 0.9 % IV SOLN
421.0000 mg/m2 | Freq: Once | INTRAVENOUS | Status: AC
  Administered 2020-05-27: 800 mg via INTRAVENOUS
  Filled 2020-05-27: qty 40

## 2020-05-27 MED ORDER — DULOXETINE HCL 30 MG PO CPEP
60.0000 mg | ORAL_CAPSULE | Freq: Every day | ORAL | 1 refills | Status: DC
Start: 2020-05-27 — End: 2020-06-24

## 2020-05-27 MED ORDER — SODIUM CHLORIDE 0.9 % IV SOLN
10.0000 mg | Freq: Once | INTRAVENOUS | Status: AC
  Administered 2020-05-27: 10 mg via INTRAVENOUS
  Filled 2020-05-27: qty 10

## 2020-05-27 MED ORDER — SODIUM CHLORIDE 0.9 % IV SOLN
2400.0000 mg/m2 | INTRAVENOUS | Status: DC
  Administered 2020-05-27: 4550 mg via INTRAVENOUS
  Filled 2020-05-27: qty 91

## 2020-05-27 MED ORDER — PALONOSETRON HCL INJECTION 0.25 MG/5ML
0.2500 mg | Freq: Once | INTRAVENOUS | Status: AC
  Administered 2020-05-27: 0.25 mg via INTRAVENOUS
  Filled 2020-05-27: qty 5

## 2020-05-27 MED ORDER — SODIUM CHLORIDE 0.9 % IV SOLN
Freq: Once | INTRAVENOUS | Status: AC
  Filled 2020-05-27: qty 250

## 2020-05-27 NOTE — Progress Notes (Addendum)
Hematology/Oncology  Follow up note [facility] [phone] [phone]   Patient Care Team: Maryella Shivers as PCP - General (Physician Assistant) Benita Gutter, RN as Oncology Nurse Navigator Rickard Patience, MD as Consulting Physician (Hematology and Oncology) Juanell Fairly, RN as Case Manager Sherlon Handing, MD as Consulting Physician (Internal Medicine)  REFERRING PROVIDER: Trey Sailors, PA-C  CHIEF COMPLAINTS/REASON FOR VISIT:  Follow up for treatment of pancreatic cancer  HISTORY OF PRESENTING ILLNESS:   Craig [name] is a  74 y.o.  male with PMH listed below, including CABG x5, PVD, hypertension, former tobacco abuse, former alcohol abuse, iron deficiency anemia, was seen in consultation at the request of  Jodi Marble, Adriana M, PA-C  for evaluation of abnormal CT Patient recently presented to primary care provider complaining for abdominal pain radiating to his back for about 10 days.  Patient uses Tylenol as needed for pain. Work-up showed elevated amylase, lipase, mild anemia with hemoglobin of 11.3 Acute hepatitis panel negative. 05/16/2019 CT abdomen pelvis with contrast showed poorly marginated heterogeneous hypodense 2.9 cm pancreatic mass at the uncinate process, worrisome for primary pancreatic cancer.  No biliary or pancreatic duct dilation. Several ill defined hypodense liver masses scattered throughout the liver, largest 3.1 cm in the segment 6 right liver lobe. Nonspecific portacaval and aortocaval adenopathy. Extreme medial left lung base 4.4 cm pleural-based mass probably a pleural metastasis.  Additional tiny pulmonary nodules scattered at the right lung base are indeterminate. Mild prostate or megaly  He had a history of 37.5-pack-year smoking history quitting 2018. No longer drinking alcohol.  # ultrasound-guided liver biopsy on 05/21/2019.  Pathology showed fragments of benign hepatic parenchyma showing  minimal macro vascular steatosis, otherwise no significant histo pathologic change. Single core of renal tissue showing approximately 25% glomerulosclerosis  # #History of CAD, status post CABG x5.  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  # Omniseq test showed PD-L1 50% TPS, TMB high, negative for BRCA1/2 or NTRK fusion.  MSI could not be completed.  Patient declines genetic testing # 06/07/2019 -1st line chemotherapy Gemcitabine and abraxane. 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 # 11/14/2019- progression, 2nd line treatment with keytruda # 01/07/2020 -progression, start third line chemotherapy treatment 5-FU with liposomal irinotecan 01/24/2020-01/26/2020 patient was admitted to ICU due to DKA. on long-acting insulin 20 units at night and takes sliding scale with meals.  INTERVAL HISTORY [name] is a 74 y.o. male who has above history reviewed by me today presents for evaluation of chemotherapy tolerability for metastatic pancreatic cancer.   Problems and complaints are listed below: Patient has been weaned off gabapentin and he has now on Cymbalta 30 mg daily. He feels that his neuropathy symptoms of bilateral feet is partially relieved. No fever chills, nausea vomiting.  No diarrhea Appetite is good.  .. Review of Systems  Constitutional: Positive for fatigue. Negative for appetite change, chills, fever and unexpected weight change.  HENT:   Negative for hearing loss and voice change.   Eyes: Negative for eye problems and icterus.  Respiratory: Negative for chest tightness, cough and shortness of breath.   Cardiovascular: Negative for chest pain and leg swelling.  Gastrointestinal: Negative for abdominal distention and abdominal pain.  Endocrine: Negative for hot flashes.  Genitourinary: Negative for difficulty urinating, dysuria and  frequency.   Musculoskeletal: Negative for arthralgias.  Skin: Negative for  itching and rash.  Neurological: Positive for numbness. Negative for light-headedness.  Hematological: Negative for adenopathy. Does not bruise/bleed easily.  Psychiatric/Behavioral: Negative for confusion.    MEDICAL HISTORY:  Past Medical History:  Diagnosis Date  . Allergic rhinitis   . CHF (congestive heart failure) ([place])   . Chronic cholecystitis   . Coronary artery disease   . DDD (degenerative disc disease), cervical   . Dehydration 06/21/2019  . Diabetes mellitus without complication ([place])   . Dyspnea   . Early cataract   . Erectile dysfunction   . GERD (gastroesophageal reflux disease)   . Gouty arthritis   . Headache    occasional migraines  . Hypercalcemia   . Hyperlipemia   . Hypertension   . Palpitations   . Pancreatic cancer metastasized to liver ([place]) 05/2019   Chemo tx's  . Peripheral vascular disease ([place])   . 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   LIMA to LAD SVG to [employer] SVG SEQUENTIALLY to OM1 and OM2 SVG to ACUTE MARGINAL  . Spinal stenosis of cervical region   . Vitamin D deficiency     SURGICAL HISTORY: Past Surgical History:  Procedure Laterality Date  . BACK SURGERY  2018    fusion with screws  . CHOLECYSTECTOMY N/A 11/13/2018   Procedure: LAPAROSCOPIC CHOLECYSTECTOMY;  Surgeon: Vickie Epley, MD;  Location: [facility];  Service: General;  Laterality: N/A;  . COLONOSCOPY    . CORONARY ARTERY BYPASS GRAFT N/A 09/30/2017   Procedure: CORONARY ARTERY BYPASS GRAFTING times five using right and left Saphaneous vein harvested endoscopicly  and left internal mammary artery. (CABG),TEE;  Surgeon: Rexene Alberts, MD;  Location: [place];  Service: Open Heart Surgery;  Laterality: N/A;  . LEFT HEART CATH AND CORONARY ANGIOGRAPHY Left 09/06/2017   Procedure: LEFT HEART CATH AND CORONARY ANGIOGRAPHY;  Surgeon: Corey Skains, MD;  Location: [place] [facility];  Service:  Cardiovascular;  Laterality: Left;  . percutaneous transluminal balloon angioplasty  01/2010   of left lower extremity  . PORTA CATH INSERTION N/A 06/06/2019   Procedure: PORTA CATH INSERTION;  Surgeon: Katha Cabal, MD;  Location: [place] [facility];  Service: Cardiovascular;  Laterality: N/A;  . TEE WITHOUT CARDIOVERSION N/A 09/30/2017   Procedure: TRANSESOPHAGEAL ECHOCARDIOGRAM (TEE);  Surgeon: Rexene Alberts, MD;  Location: [place];  Service: Open Heart Surgery;  Laterality: N/A;  . VASCULAR SURGERY Left 2010   left exernal iliac and superficial femoral artery PTCA and stenting    SOCIAL HISTORY: Social History   Socioeconomic History  . Marital status: Married    Spouse name: Enid Derry  . Number of children: 2  . Years of education: Not on file  . Highest education level: 5th grade  Occupational History  . Occupation: drove tractors    Comment: retired  Tobacco Use  . Smoking status: Former Smoker    Packs/day: 0.75    Years: 50.00    Pack years: 37.50    Types: Cigarettes    Quit date: 09/27/2017    Years since quitting: 2.6  . Smokeless tobacco: Former Systems developer    Types: Secondary school teacher  . Vaping Use: Never used  Substance and Sexual Activity  . Alcohol use: Not Currently    Comment: stopped drinking before cabg  . Drug use: Not Currently    Types: Marijuana    Comment: stopped smoking before CABG  . Sexual activity: Not Currently  Other Topics Concern  . Not on file  Social History Narrative  . Not on file   Social Determinants of Health   Financial Resource Strain: Low Risk   . Difficulty of Paying Living Expenses: Not hard at all  Food Insecurity: No Food Insecurity  . Worried About Charity fundraiser in the Last Year: Never true  . Ran Out of Food in the Last Year: Never true  Transportation Needs: No Transportation Needs  . Lack of Transportation (Medical): No  . Lack of Transportation (Non-Medical): No  Physical Activity: Inactive  . Days of  Exercise per Week: 0 days  . Minutes of Exercise per Session: 0 min  Stress: No Stress Concern Present  . Feeling of Stress : Not at all  Social Connections: Moderately Isolated  . Frequency of Communication with Friends and Family: More than three times a week  . Frequency of Social Gatherings with Friends and Family: More than three times a week  . Attends Religious Services: Never  . Active Member of Clubs or Organizations: No  . Attends Archivist Meetings: Never  . Marital Status: Married  Human resources officer Violence: Not At Risk  . Fear of Current or Ex-Partner: No  . Emotionally Abused: No  . Physically Abused: No  . Sexually Abused: No    FAMILY HISTORY: Family History  Problem Relation Age of Onset  . Brain cancer Mother   . Emphysema Father   . Cancer Brother     ALLERGIES:  is allergic to lipitor [atorvastatin], zetia [ezetimibe], lisinopril, protonix [pantoprazole], and spironolactone.  MEDICATIONS:  Current Outpatient Medications  Medication Sig Dispense Refill  . aspirin EC 81 MG tablet Take 81 mg by mouth daily.    . carvedilol (COREG) 6.25 MG tablet Take 6.25 mg by mouth 2 (two) times daily with a meal.  3  . Continuous Blood Gluc Receiver (FREESTYLE LIBRE 14 DAY READER) DEVI 1 Device by Does not apply route every 14 (fourteen) days. 6 each 1  . diltiazem (CARDIZEM CD) 300 MG 24 hr capsule TAKE 1 CAPSULE BY MOUTH EVERY DAY 90 capsule 1  . dorzolamide-timolol (COSOPT) 22.3-6.8 MG/ML ophthalmic solution INSTILL ONE DROP INTO BOTH EYES TWICE DAILY    . DULoxetine (CYMBALTA) 30 MG capsule Take 1 capsule (30 mg total) by mouth daily. 30 capsule 0  . ferrous sulfate 325 (65 FE) MG tablet TAKE 1 TABLET BY MOUTH EVERY DAY WITH BREAKFAST 90 tablet 1  . gabapentin (NEURONTIN) 300 MG capsule TAKE 2 CAPSULES (600 MG TOTAL) BY MOUTH 3 (THREE) TIMES DAILY. 540 capsule 0  . ibuprofen (ADVIL) 800 MG tablet Take 1 tablet (800 mg total) by mouth every 8 (eight) hours as  needed. 30 tablet 0  . insulin glargine (LANTUS SOLOSTAR) 100 UNIT/ML Solostar Pen Inject 20 Units into the skin at bedtime. (Patient taking differently: Inject 16 Units into the skin at bedtime. ) 15 mL 0  . insulin lispro (HUMALOG) 100 UNIT/ML KwikPen Inject 0.05 mLs (5 Units total) into the skin 3 (three) times daily with meals. (Patient not taking: Reported on 04/15/2020) 15 mL 11  . Insulin Pen Needle (PEN NEEDLES) 32G X 5 MM MISC Use four times daily for insulin 400 each 1  . latanoprost (XALATAN) 0.005 % ophthalmic solution Place 1 drop into both eyes at bedtime.   3  . lidocaine-prilocaine (EMLA) cream APPLY TO AFFECTED AREA ONCE AS DIRECTED    . loperamide (IMODIUM A-D) 2 MG tablet Take 1 tablet (2 mg total) by mouth See admin instructions. Take  2 at diarrhea onset , then 1 every 2hr until 12hrs with no BM. May take 2 every 4hrs at night. If diarrhea recurs repeat. (Patient not taking: Reported on 04/29/2020) 100 tablet 1  . Multiple Vitamin (MULTIVITAMIN WITH MINERALS) TABS tablet Take 1 tablet by mouth daily.    . OMEGA-3 FATTY ACIDS PO Take 1,200 mg by mouth daily.     [name] Kitchen omeprazole (PRILOSEC) 40 MG capsule TAKE 1 CAPSULE BY MOUTH EVERY DAY 90 capsule 1  . ONETOUCH VERIO test strip TEST FASTING SUGAR EVERY MORNING 100 strip 3  . oxyCODONE (OXY IR/ROXICODONE) 5 MG immediate release tablet Take 1 tablet (5 mg total) by mouth every 6 (six) hours as needed for severe pain. (Patient not taking: Reported on 04/15/2020) 30 tablet 0  . potassium chloride (KLOR-CON) 10 MEQ tablet TAKE 1 TABLET BY MOUTH EVERY DAY 30 tablet 2  . rosuvastatin (CRESTOR) 10 MG tablet Take 1 tablet by mouth daily.    [name] Kitchen triamcinolone ointment (KENALOG) 0.5 % APPLY 1 APPLICATION TOPICALLY 3 (THREE) TIMES DAILY. 30 g 0   No current facility-administered medications for this visit.   Facility-Administered Medications Ordered in Other Visits  Medication Dose Route Frequency Provider Last Rate Last Admin  . sodium chloride  flush (NS) 0.9 % injection 10 mL  10 mL Intracatheter PRN Earlie Server, MD      . sodium chloride flush (NS) 0.9 % injection 10 mL  10 mL Intravenous Once Earlie Server, MD         PHYSICAL EXAMINATION: ECOG PERFORMANCE STATUS: 1 - Symptomatic but completely ambulatory Vitals:   05/27/20 0845  BP: 121/80  Pulse: 77  Resp: 20  Temp: 97.6 F (36.4 C)  SpO2: 100%   Filed Weights   05/27/20 0845  Weight: 171 lb 14.4 oz (78 kg)    Physical Exam Constitutional:      General: He is not in acute distress. HENT:     Head: Normocephalic and atraumatic.  Eyes:     General: No scleral icterus. Cardiovascular:     Rate and Rhythm: Normal rate and regular rhythm.     Heart sounds: Normal heart sounds.  Pulmonary:     Effort: Pulmonary effort is normal. No respiratory distress.     Breath sounds: No wheezing.  Abdominal:     General: Bowel sounds are normal. There is no distension.     Palpations: Abdomen is soft.  Musculoskeletal:        General: No deformity. Normal range of motion.     Cervical back: Normal range of motion and neck supple.  Skin:    General: Skin is warm and dry.     Findings: No erythema or rash.  Neurological:     Mental Status: He is alert and oriented to person, place, and time. Mental status is at baseline.     Cranial Nerves: No cranial nerve deficit.     Coordination: Coordination normal.  Psychiatric:        Mood and Affect: Mood normal.     LABORATORY DATA:  I have reviewed the data as listed Lab Results  Component Value Date   WBC 5.4 05/13/2020   HGB 10.7 (L) 05/13/2020   HCT 34.1 (L) 05/13/2020   MCV 94.2 05/13/2020   PLT 191 05/13/2020   Recent Labs    04/15/20 0833 04/29/20 0809 05/13/20 0813  NA 141 140 140  K 4.2 4.2 4.1  CL 108 109 106  CO2 25 24 24  GLUCOSE 132* 148* 177*  BUN '18 21 19  '$ CREATININE 0.91 1.09 1.07  CALCIUM 9.0 9.1 9.0  GFRNONAA >60 >60 >60  GFRAA >60 >60 >60  PROT 6.5 6.4* 6.5  ALBUMIN 3.6 3.6 3.6  AST '18 18  21  '$ ALT '16 17 24  '$ ALKPHOS 58 64 74  BILITOT 0.6 0.6 0.6   Iron/TIBC/Ferritin/ %Sat    Component Value Date/Time   IRON 119 04/27/2019 0940   TIBC 363 04/27/2019 0940   FERRITIN 58 04/27/2019 0940   IRONPCTSAT 33 04/27/2019 0940      RADIOGRAPHIC STUDIES: I have personally reviewed the radiological images as listed and agreed with the findings in the report. CT Chest W Contrast  Result Date: 04/23/2020 CLINICAL DATA:  Restaging metastatic pancreatic cancer. Interval chemotherapy. EXAM: CT CHEST, ABDOMEN, AND PELVIS WITH CONTRAST TECHNIQUE: Multidetector CT imaging of the chest, abdomen and pelvis was performed following the standard protocol during bolus administration of intravenous contrast. CONTRAST:  174m OMNIPAQUE IOHEXOL 300 MG/ML  SOLN COMPARISON:  01/02/2020 FINDINGS: CT CHEST FINDINGS Cardiovascular: Right Port-A-Cath tip: SVC. Coronary, aortic arch, and branch vessel atherosclerotic vascular disease. Prior CABG. Mediastinum/Nodes: Left infrahilar node 0.9 cm in short axis on image 30/2, previously the same by my measurements. Right paratracheal node 0.8 cm in short axis on image 13/2, previously 0.9 cm by my measurements. Lungs/Pleura: Ill-defined 0.5 cm subpleural nodule in the right upper lobe on image 66/3 likely corresponding to the slightly more solid-appearing 0.5 cm nodule shown on the prior exam. 3 mm subpleural nodule in the right upper lobe on image 67/3, stable. Stable 3 mm right middle lobe subpleural nodule. 0.5 cm right lower lobe subpleural nodule on image 102/3, stable. Other minimal subpleural nodularity noted on the right side, stable. A pleural mass along the left hemidiaphragm measures 2.1 cm in short axis thickness on image 52/4, previously the same. This was previously not hypermetabolic on 033/82/5053 Musculoskeletal: Thoracic spondylosis. Partial fusion of the T3-4 level including the posterior elements, likely congenital. Stable anterior wedge compression at T5.  CT ABDOMEN PELVIS FINDINGS Hepatobiliary: Scattered hepatic metastatic lesions are present. Ill-defined margins, but roughly similar size and distribution to the 01/02/2020 exam. A index right hepatic lobe lesion measuring 2.2 cm transversely on image 49/2 is similar in overall size. Most of the lesions have slightly less indistinct borders today compared to previous. Cholecystectomy.  No biliary dilatation noted. Pancreas: Reduced size of the pancreas potentially from atrophy or resolution of prior inflammation, I favor the former. Mildly prominent dorsal pancreatic duct extending to the somewhat prominent pancreatic head which appears to have accentuated activity compared to the rest of the pancreatic parenchyma. Presumed mass in the pancreatic head region measures proximally 3.6 by 2.8 cm on image 66/3, this was previously larger at about 4.2 by 3.8 cm on 01/02/2020. Admittedly the margins between this lesion and rest of the pancreas are difficult to discern, and I could be including some normal pancreatic tissue in these measurements. Spleen: Unremarkable Adrenals/Urinary Tract: Both adrenal glands appear normal. Bilateral renal cysts are identified. Superficial to a left mid kidney cyst on image 64/2, and more complex lesion measuring 1.5 by 0.8 cm is stable, and is probably a complex cyst based on the pre contrast hyperdensity shown on the PET-CT of 06/04/2019. Other potentially complex left renal cysts are present, and were likewise hyperdense on prior PET-CT. Urinary bladder unremarkable. Stomach/Bowel: Gastric wall thickening particularly along the greater curvature. This was present on prior PET-CT of 06/04/2019 as  well, but not hypermetabolic, and may be primarily from nondistention. Vascular/Lymphatic: Aortoiliac atherosclerotic vascular disease. Portacaval node 0.9 cm in short axis on image 59/2, previously 1.0 cm by my measurement. Reproductive: Borderline prostatomegaly. Other: No supplemental  non-categorized findings. Musculoskeletal: Posterolateral rod and pedicle screw fixation with interbody spacer at the L4-5 level. No findings of osseous metastatic disease. IMPRESSION: 1. Overall similar appearance of the scattered hepatic metastatic lesions. Mass in the pancreatic head region is moderately reduced in size compared to the prior exam. 2. Worsening pancreatic atrophy. 3. Stable pleural based mass along the left hemidiaphragm. 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 4. Several tiny subpleural pulmonary nodules are stable. 5. Gastric wall thickening particularly along the greater curvature, probably from nondistention. This was present but not hypermetabolic on prior PET-CT of 06/04/2019. 6. Bilateral renal cysts, of varying complexity on the left. 7. Aortic atherosclerosis. Aortic Atherosclerosis (ICD10-I70.0). Electronically Signed   By: Van Clines M.D.   On: 04/23/2020 15:01   CT Abdomen Pelvis W Contrast  Result Date: 04/23/2020 CLINICAL DATA:  Restaging metastatic pancreatic cancer. Interval chemotherapy. EXAM: CT CHEST, ABDOMEN, AND PELVIS WITH CONTRAST TECHNIQUE: Multidetector CT imaging of the chest, abdomen and pelvis was performed following the standard protocol during bolus administration of intravenous contrast. CONTRAST:  127m OMNIPAQUE IOHEXOL 300 MG/ML  SOLN COMPARISON:  01/02/2020 FINDINGS: CT CHEST FINDINGS Cardiovascular: Right Port-A-Cath tip: SVC. Coronary, aortic arch, and branch vessel atherosclerotic vascular disease. Prior CABG. Mediastinum/Nodes: Left infrahilar node 0.9 cm in short axis on image 30/2, previously the same by my measurements. Right paratracheal node 0.8 cm in short axis on image 13/2, previously 0.9 cm by my measurements. Lungs/Pleura: Ill-defined 0.5 cm subpleural nodule in the right upper lobe on image 66/3 likely corresponding to the slightly more solid-appearing 0.5 cm nodule shown on  the prior exam. 3 mm subpleural nodule in the right upper lobe on image 67/3, stable. Stable 3 mm right middle lobe subpleural nodule. 0.5 cm right lower lobe subpleural nodule on image 102/3, stable. Other minimal subpleural nodularity noted on the right side, stable. A pleural mass along the left hemidiaphragm measures 2.1 cm in short axis thickness on image 52/4, previously the same. This was previously not hypermetabolic on 030/06/2329 Musculoskeletal: Thoracic spondylosis. Partial fusion of the T3-4 level including the posterior elements, likely congenital. Stable anterior wedge compression at T5. CT ABDOMEN PELVIS FINDINGS Hepatobiliary: Scattered hepatic metastatic lesions are present. Ill-defined margins, but roughly similar size and distribution to the 01/02/2020 exam. A index right hepatic lobe lesion measuring 2.2 cm transversely on image 49/2 is similar in overall size. Most of the lesions have slightly less indistinct borders today compared to previous. Cholecystectomy.  No biliary dilatation noted. Pancreas: Reduced size of the pancreas potentially from atrophy or resolution of prior inflammation, I favor the former. Mildly prominent dorsal pancreatic duct extending to the somewhat prominent pancreatic head which appears to have accentuated activity compared to the rest of the pancreatic parenchyma. Presumed mass in the pancreatic head region measures proximally 3.6 by 2.8 cm on image 66/3, this was previously larger at about 4.2 by 3.8 cm on 01/02/2020. Admittedly the margins between this lesion and rest of the pancreas are difficult to discern, and I could be including some normal pancreatic tissue in these measurements. Spleen: Unremarkable Adrenals/Urinary Tract: Both adrenal glands appear normal. Bilateral renal cysts are identified. Superficial to a left mid kidney cyst on image 64/2, and more  complex lesion measuring 1.5 by 0.8 cm is stable, and is probably a complex cyst based on the pre  contrast hyperdensity shown on the PET-CT of 06/04/2019. Other potentially complex left renal cysts are present, and were likewise hyperdense on prior PET-CT. Urinary bladder unremarkable. Stomach/Bowel: Gastric wall thickening particularly along the greater curvature. This was present on prior PET-CT of 06/04/2019 as well, but not hypermetabolic, and may be primarily from nondistention. Vascular/Lymphatic: Aortoiliac atherosclerotic vascular disease. Portacaval node 0.9 cm in short axis on image 59/2, previously 1.0 cm by my measurement. Reproductive: Borderline prostatomegaly. Other: No supplemental non-categorized findings. Musculoskeletal: Posterolateral rod and pedicle screw fixation with interbody spacer at the L4-5 level. No findings of osseous metastatic disease. IMPRESSION: 1. Overall similar appearance of the scattered hepatic metastatic lesions. Mass in the pancreatic head region is moderately reduced in size compared to the prior exam. 2. Worsening pancreatic atrophy. 3. Stable pleural based mass along the left hemidiaphragm. 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 4. Several tiny subpleural pulmonary nodules are stable. 5. Gastric wall thickening particularly along the greater curvature, probably from nondistention. This was present but not hypermetabolic on prior PET-CT of 06/04/2019. 6. Bilateral renal cysts, of varying complexity on the left. 7. Aortic atherosclerosis. Aortic Atherosclerosis (ICD10-I70.0). Electronically Signed   By: Van Clines M.D.   On: 04/23/2020 15:01      ASSESSMENT & PLAN:  1. Pancreatic cancer metastasized to liver ([place])   2. Anemia due to antineoplastic chemotherapy   3. Encounter for antineoplastic chemotherapy   4. Neuropathy    #Metastatic pancreatic cancer- liver mets, hilar nodal metastasis, ?bone mets, CA 19-9 nadir at 6679, now trend up again.  04/23/2020 CT abdomen pelvis showed partial  response.  Labs are reviewed and discussed with patient.  Proceed with 5-FU and liposomal irinotecan. Short-term CT follow-up.  #Chronic hypokalemia, K is stable at 4.1, continue potassium chloride 10 mEq daily.   #  diabetes/ hyperglycemia, hyperglycemia is better controlled now on insulin regimen.  Today's glucose level is 177 Continue follow-up with primary care provider #Neuropathy, continue Cymbalta, he has been weaned off gabapentin.  I will increase Cymbalta to 60 mg daily. #elevated creatinine to 1.29, encourage oral hydration. He will receive 590m IV NS for hydration today.  #Normocytic anemia, likely secondary to chemo.  Hemoglobin is 11.2.  #All questions were answered. The patient knows to call the clinic with any problems questions or concerns. Follow-up  2 weeks.   ZEarlie Server MD, PhD Hematology Oncology [facility] [place] Endoscopy8/17/2021

## 2020-05-28 LAB — CANCER ANTIGEN 19-9: CA 19-9: 20627 U/mL — ABNORMAL HIGH (ref 0–35)

## 2020-05-29 ENCOUNTER — Other Ambulatory Visit: Payer: Self-pay | Admitting: Oncology

## 2020-05-29 ENCOUNTER — Inpatient Hospital Stay: Payer: Medicare Other

## 2020-05-29 ENCOUNTER — Telehealth: Payer: Self-pay

## 2020-05-29 ENCOUNTER — Other Ambulatory Visit: Payer: Self-pay

## 2020-05-29 DIAGNOSIS — C787 Secondary malignant neoplasm of liver and intrahepatic bile duct: Secondary | ICD-10-CM

## 2020-05-29 DIAGNOSIS — C259 Malignant neoplasm of pancreas, unspecified: Secondary | ICD-10-CM

## 2020-05-29 DIAGNOSIS — Z5111 Encounter for antineoplastic chemotherapy: Secondary | ICD-10-CM | POA: Diagnosis not present

## 2020-05-29 MED ORDER — HEPARIN SOD (PORK) LOCK FLUSH 100 UNIT/ML IV SOLN
500.0000 [IU] | Freq: Once | INTRAVENOUS | Status: AC | PRN
  Administered 2020-05-29: 500 [IU]
  Filled 2020-05-29: qty 5

## 2020-05-29 MED ORDER — HEPARIN SOD (PORK) LOCK FLUSH 100 UNIT/ML IV SOLN
INTRAVENOUS | Status: AC
  Filled 2020-05-29: qty 5

## 2020-05-29 MED ORDER — SODIUM CHLORIDE 0.9% FLUSH
10.0000 mL | INTRAVENOUS | Status: DC | PRN
  Administered 2020-05-29: 10 mL
  Filled 2020-05-29: qty 10

## 2020-05-29 NOTE — Telephone Encounter (Signed)
Patient informed. Please schedule CT per MD request and notify pt of appt.

## 2020-05-29 NOTE — Telephone Encounter (Signed)
-----   Message from Earlie Server, MD sent at 05/29/2020  4:04 PM EDT ----- Tumor markers are trending up again. Please arrange him to do CT C/A/P this or next week.   ordered.

## 2020-05-30 NOTE — Telephone Encounter (Signed)
Done.. Pts CT has been scheduled as requested. Called him and made him aware of the sched 06/04/20 appt date at the  [facility] Location, time of his appt. arrive 71mins prior, NPO 4hrs and must pick up prep kit before the sched appt date.

## 2020-06-03 IMAGING — CR DG KNEE 1-2V*R*
1 series · 2 of 2 positions shown · non-contrast
Comparison: None.

CLINICAL DATA: Lateral knee pain and swelling with difficulty
walking. No known injury. History of colon cancer.

EXAM:
RIGHT KNEE - 1-2 VIEW

[Series 1: dg knee 1-2 views right · 0.14mm/px · 2 of 2 slices shown]
[im 1/2]
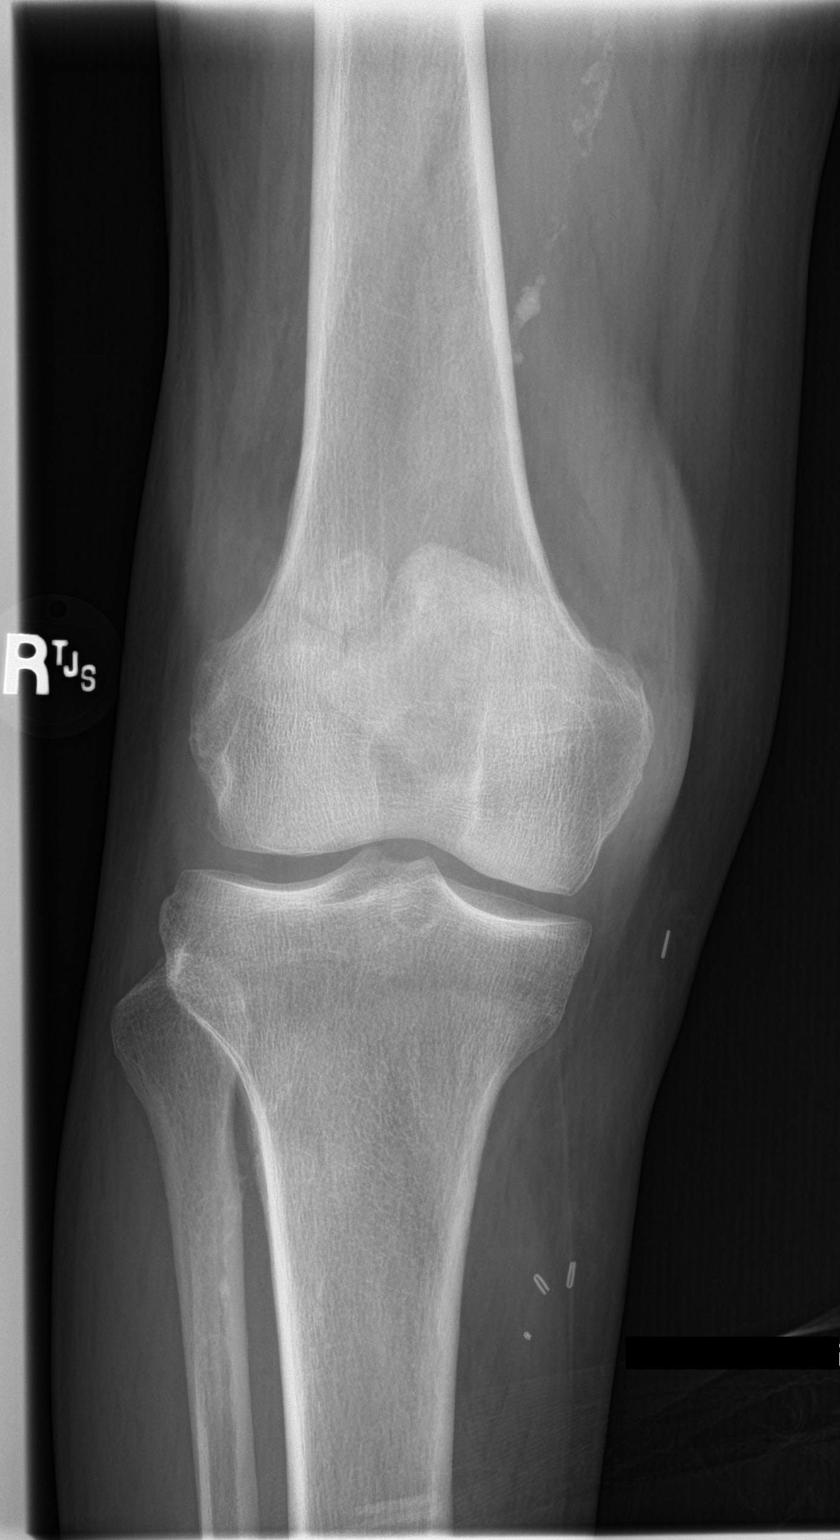
[im 2/2]
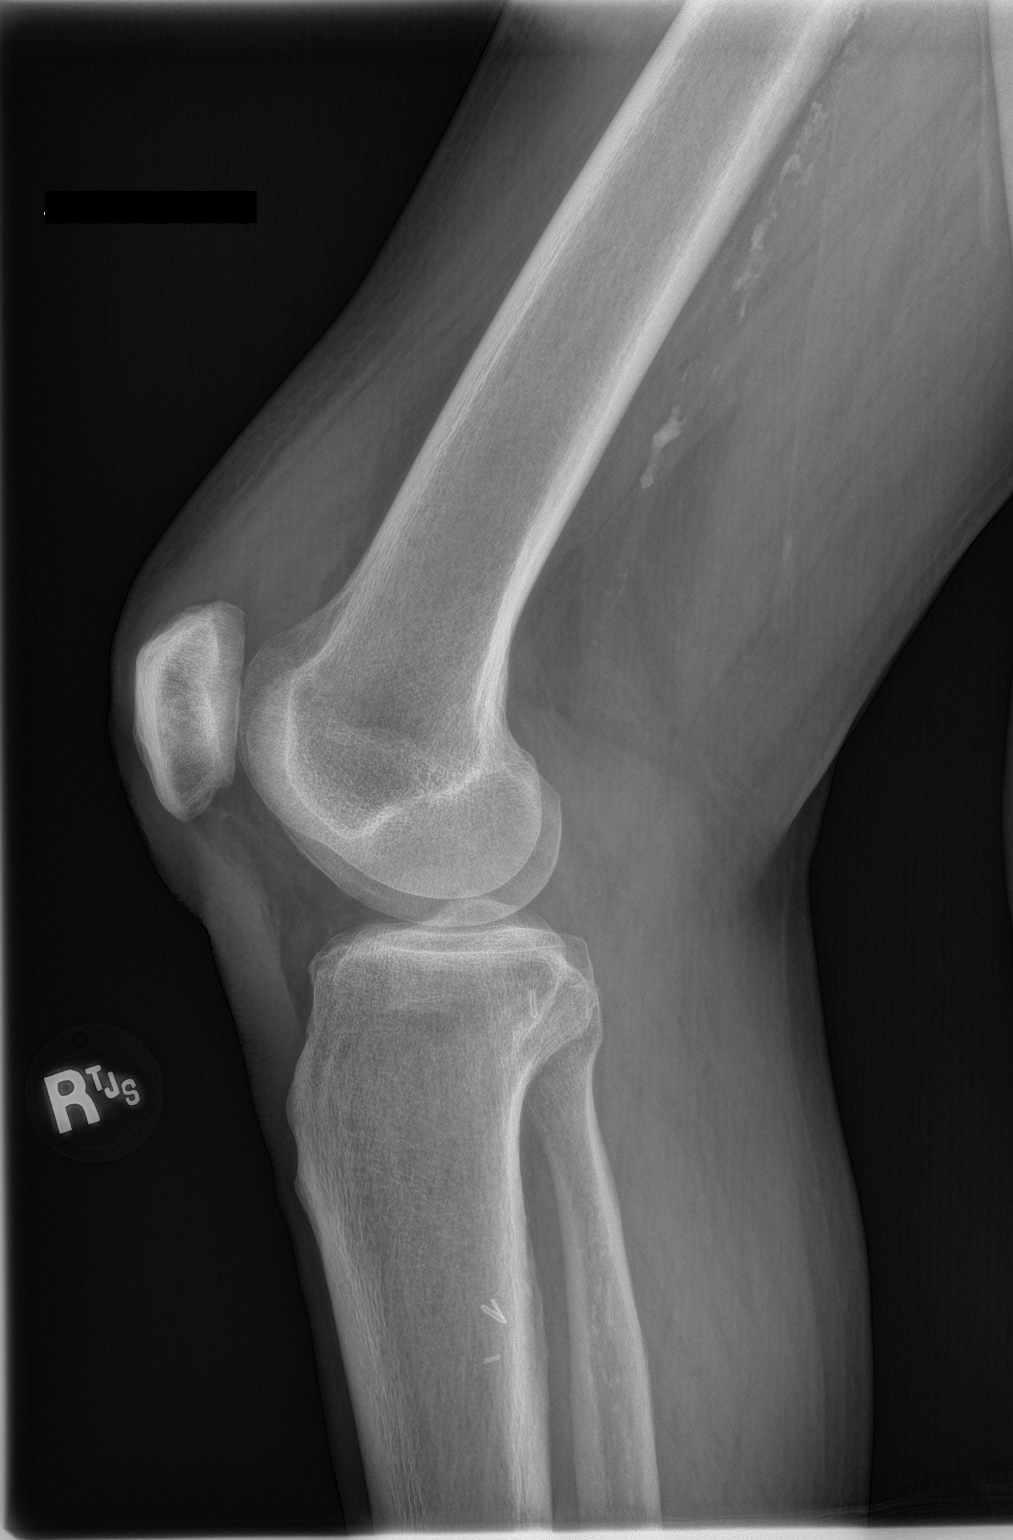

[2 of 2 positions shown; findings below may reference images not displayed]

FINDINGS: The mineralization and alignment are normal. There is no evidence of
acute fracture or dislocation. The joint spaces are relatively
preserved. There is fragmentation of the patella superolaterally
consistent with a bipartite patella (normal variant). There is a
moderate size knee joint effusion. Femoropopliteal atherosclerosis
is noted with vascular clips medially in the lower leg.
IMPRESSION: 1. No acute osseous findings.
2. Moderate size knee joint effusion.
3. Bipartite patella.

## 2020-06-04 ENCOUNTER — Ambulatory Visit
Admission: RE | Admit: 2020-06-04 | Discharge: 2020-06-04 | Disposition: A | Discharge status: Home / Self Care | Payer: Medicare Other | Source: Ambulatory Visit | Attending: Oncology | Admitting: Oncology

## 2020-06-04 ENCOUNTER — Other Ambulatory Visit: Payer: Self-pay

## 2020-06-04 DIAGNOSIS — C787 Secondary malignant neoplasm of liver and intrahepatic bile duct: Secondary | ICD-10-CM | POA: Insufficient documentation

## 2020-06-04 DIAGNOSIS — C259 Malignant neoplasm of pancreas, unspecified: Secondary | ICD-10-CM | POA: Diagnosis not present

## 2020-06-04 MED ORDER — IOHEXOL 300 MG/ML  SOLN
100.0000 mL | Freq: Once | INTRAMUSCULAR | Status: AC | PRN
  Administered 2020-06-04: 100 mL via INTRAVENOUS

## 2020-06-06 ENCOUNTER — Other Ambulatory Visit: Payer: Self-pay | Admitting: Oncology

## 2020-06-06 DIAGNOSIS — C259 Malignant neoplasm of pancreas, unspecified: Secondary | ICD-10-CM

## 2020-06-06 NOTE — Progress Notes (Signed)
ON PATHWAY REGIMEN - Pancreatic Adenocarcinoma  No Change  Continue With Treatment as Ordered.  Original Decision Date/Time: 01/02/2020 14:53     A cycle is every 14 days:     Liposomal irinotecan      Leucovorin      Fluorouracil   **Always confirm dose/schedule in your pharmacy ordering system**  Patient Characteristics: Metastatic Disease, Second Line, MSS/pMMR or MSI Unknown, Gemcitabine-Based Therapy First Line Therapeutic Status: Metastatic Disease Line of Therapy: Second Line Microsatellite/Mismatch Repair Status: Unknown Intent of Therapy: Non-Curative / Palliative Intent, Discussed with Patient

## 2020-06-06 NOTE — Progress Notes (Signed)
DISCONTINUE ON PATHWAY REGIMEN - Pancreatic Adenocarcinoma     A cycle is every 14 days:     Liposomal irinotecan      Leucovorin      Fluorouracil   **Always confirm dose/schedule in your pharmacy ordering system**  REASON: Disease Progression PRIOR TREATMENT: [id number]: Liposomal Irinotecan 70 mg/m2 + Fluorouracil 2,400 mg/m2 CIV + Leucovorin 400 mg/m2 q14 Days TREATMENT RESPONSE: Progressive Disease (PD)  START ON PATHWAY REGIMEN - Pancreatic Adenocarcinoma     A cycle is every 14 days:     Oxaliplatin      Leucovorin      Fluorouracil      Fluorouracil   **Always confirm dose/schedule in your pharmacy ordering system**  Patient Characteristics: Metastatic Disease, Third Line and Beyond, MSS/pMMR or MSI Unknown Therapeutic Status: Metastatic Disease Line of Therapy: Third Engineer, civil (consulting) Status: Unknown Intent of Therapy: Non-Curative / Palliative Intent, Discussed with Patient

## 2020-06-10 ENCOUNTER — Other Ambulatory Visit: Payer: Self-pay

## 2020-06-10 ENCOUNTER — Inpatient Hospital Stay: Payer: Medicare Other

## 2020-06-10 ENCOUNTER — Other Ambulatory Visit: Payer: Self-pay | Admitting: Physician Assistant

## 2020-06-10 ENCOUNTER — Encounter: Payer: Self-pay | Admitting: Oncology

## 2020-06-10 ENCOUNTER — Inpatient Hospital Stay (HOSPITAL_BASED_OUTPATIENT_CLINIC_OR_DEPARTMENT_OTHER): Payer: Medicare Other | Admitting: Oncology

## 2020-06-10 VITALS — BP 146/76 | HR 76 | Temp 98.0°F | Wt 171.1 lb

## 2020-06-10 DIAGNOSIS — G629 Polyneuropathy, unspecified: Secondary | ICD-10-CM

## 2020-06-10 DIAGNOSIS — C787 Secondary malignant neoplasm of liver and intrahepatic bile duct: Secondary | ICD-10-CM

## 2020-06-10 DIAGNOSIS — K219 Gastro-esophageal reflux disease without esophagitis: Secondary | ICD-10-CM

## 2020-06-10 DIAGNOSIS — Z5111 Encounter for antineoplastic chemotherapy: Secondary | ICD-10-CM | POA: Diagnosis not present

## 2020-06-10 DIAGNOSIS — C259 Malignant neoplasm of pancreas, unspecified: Secondary | ICD-10-CM | POA: Diagnosis not present

## 2020-06-10 DIAGNOSIS — Z7189 Other specified counseling: Secondary | ICD-10-CM

## 2020-06-10 DIAGNOSIS — T451X5A Adverse effect of antineoplastic and immunosuppressive drugs, initial encounter: Secondary | ICD-10-CM

## 2020-06-10 DIAGNOSIS — D6481 Anemia due to antineoplastic chemotherapy: Secondary | ICD-10-CM

## 2020-06-10 LAB — CBC WITH DIFFERENTIAL/PLATELET
Abs Immature Granulocytes: 0.04 10*3/uL (ref 0.00–0.07)
Basophils Absolute: 0.1 10*3/uL (ref 0.0–0.1)
Basophils Relative: 1 %
Eosinophils Absolute: 0.2 10*3/uL (ref 0.0–0.5)
Eosinophils Relative: 3 %
HCT: 32.3 % — ABNORMAL LOW (ref 39.0–52.0)
Hemoglobin: 10.7 g/dL — ABNORMAL LOW (ref 13.0–17.0)
Immature Granulocytes: 1 %
Lymphocytes Relative: 14 %
Lymphs Abs: 0.7 10*3/uL (ref 0.7–4.0)
MCH: 30.7 pg (ref 26.0–34.0)
MCHC: 33.1 g/dL (ref 30.0–36.0)
MCV: 92.6 fL (ref 80.0–100.0)
Monocytes Absolute: 0.6 10*3/uL (ref 0.1–1.0)
Monocytes Relative: 11 %
Neutro Abs: 3.6 10*3/uL (ref 1.7–7.7)
Neutrophils Relative %: 70 %
Platelets: 209 10*3/uL (ref 150–400)
RBC: 3.49 MIL/uL — ABNORMAL LOW (ref 4.22–5.81)
RDW: 15.6 % — ABNORMAL HIGH (ref 11.5–15.5)
WBC: 5.1 10*3/uL (ref 4.0–10.5)
nRBC: 0 % (ref 0.0–0.2)

## 2020-06-10 LAB — COMPREHENSIVE METABOLIC PANEL
ALT: 76 U/L — ABNORMAL HIGH (ref 0–44)
AST: 41 U/L (ref 15–41)
Albumin: 3.6 g/dL (ref 3.5–5.0)
Alkaline Phosphatase: 77 U/L (ref 38–126)
Anion gap: 10 (ref 5–15)
BUN: 19 mg/dL (ref 8–23)
CO2: 25 mmol/L (ref 22–32)
Calcium: 9.1 mg/dL (ref 8.9–10.3)
Chloride: 105 mmol/L (ref 98–111)
Creatinine, Ser: 1.12 mg/dL (ref 0.61–1.24)
GFR calc Af Amer: >60 mL/min (ref >60)
GFR calc non Af Amer: >60 mL/min (ref >60)
Glucose, Bld: 148 mg/dL — ABNORMAL HIGH (ref 70–99)
Potassium: 4.1 mmol/L (ref 3.5–5.1)
Sodium: 140 mmol/L (ref 135–145)
Total Bilirubin: 0.4 mg/dL (ref 0.3–1.2)
Total Protein: 6.5 g/dL (ref 6.5–8.1)

## 2020-06-10 MED ORDER — OXALIPLATIN CHEMO INJECTION 100 MG/20ML
85.0000 mg/m2 | Freq: Once | INTRAVENOUS | Status: AC
  Administered 2020-06-10: 165 mg via INTRAVENOUS
  Filled 2020-06-10: qty 33

## 2020-06-10 MED ORDER — DEXTROSE 5 % IV SOLN
Freq: Once | INTRAVENOUS | Status: AC
  Filled 2020-06-10: qty 250

## 2020-06-10 MED ORDER — SODIUM CHLORIDE 0.9 % IV SOLN
10.0000 mg | Freq: Once | INTRAVENOUS | Status: AC
  Administered 2020-06-10: 10 mg via INTRAVENOUS
  Filled 2020-06-10: qty 10

## 2020-06-10 MED ORDER — LEUCOVORIN CALCIUM INJECTION 350 MG
410.0000 mg/m2 | Freq: Once | INTRAVENOUS | Status: AC
  Administered 2020-06-10: 800 mg via INTRAVENOUS
  Filled 2020-06-10: qty 35

## 2020-06-10 MED ORDER — SODIUM CHLORIDE 0.9% FLUSH
10.0000 mL | INTRAVENOUS | Status: DC | PRN
  Administered 2020-06-10: 10 mL via INTRAVENOUS
  Filled 2020-06-10: qty 10

## 2020-06-10 MED ORDER — SODIUM CHLORIDE 0.9 % IV SOLN
2400.0000 mg/m2 | INTRAVENOUS | Status: DC
  Administered 2020-06-10: 4700 mg via INTRAVENOUS
  Filled 2020-06-10: qty 94

## 2020-06-10 MED ORDER — PALONOSETRON HCL INJECTION 0.25 MG/5ML
0.2500 mg | Freq: Once | INTRAVENOUS | Status: AC
  Administered 2020-06-10: 0.25 mg via INTRAVENOUS
  Filled 2020-06-10: qty 5

## 2020-06-10 NOTE — Progress Notes (Signed)
Pt tolerated infusion well. No s/s of distress or reaction noted. Pt stable at discharge.  

## 2020-06-10 NOTE — Progress Notes (Signed)
Hematology/Oncology  Follow up note [facility] [phone] [phone]   Patient Care Team: Pollak, Adriana M, PA-C as PCP - General (Physician Assistant) Stanton, Kristi D, RN as Oncology Nurse Navigator Yu, Zhou, MD as Consulting Physician (Hematology and Oncology) McCray, Felecia, RN as Case Manager O'Connell, Thomas L, MD as Consulting Physician (Internal Medicine)  REFERRING PROVIDER: Pollak, Adriana M, PA-C  CHIEF COMPLAINTS/REASON FOR VISIT:  Follow up for treatment of pancreatic cancer  HISTORY OF PRESENTING ILLNESS:   Craig [name] is a  74 y.o.  male with PMH listed below, including CABG x5, PVD, hypertension, former tobacco abuse, former alcohol abuse, iron deficiency anemia, was seen in consultation at the request of  Pollak, Adriana M, PA-C  for evaluation of abnormal CT Patient recently presented to primary care provider complaining for abdominal pain radiating to his back for about 10 days.  Patient uses Tylenol as needed for pain. Work-up showed elevated amylase, lipase, mild anemia with hemoglobin of 11.3 Acute hepatitis panel negative. 05/16/2019 CT abdomen pelvis with contrast showed poorly marginated heterogeneous hypodense 2.9 cm pancreatic mass at the uncinate process, worrisome for primary pancreatic cancer.  No biliary or pancreatic duct dilation. Several ill defined hypodense liver masses scattered throughout the liver, largest 3.1 cm in the segment 6 right liver lobe. Nonspecific portacaval and aortocaval adenopathy. Extreme medial left lung base 4.4 cm pleural-based mass probably a pleural metastasis.  Additional tiny pulmonary nodules scattered at the right lung base are indeterminate. Mild prostate or megaly  He had a history of 37.5-pack-year smoking history quitting 2018. No longer drinking alcohol.  # ultrasound-guided liver biopsy on 05/21/2019.  Pathology showed fragments of benign hepatic parenchyma showing  minimal macro vascular steatosis, otherwise no significant histo pathologic change. Single core of renal tissue showing approximately 25% glomerulosclerosis  # #History of CAD, status post CABG x5.  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  # Omniseq test showed PD-L1 50% TPS, TMB high, negative for BRCA1/2 or NTRK fusion.  MSI could not be completed.  Patient declines genetic testing # 06/07/2019 -1st line chemotherapy Gemcitabine and abraxane. 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 # 11/14/2019- progression, 2nd line treatment with keytruda # 01/07/2020 -progression, start third line chemotherapy treatment 5-FU with liposomal irinotecan 01/24/2020-01/26/2020 patient was admitted to ICU due to DKA. on long-acting insulin 20 units at night and takes sliding scale with meals.  INTERVAL HISTORY Rett Blackshire is a 74 y.o. male who has above history reviewed by me today presents for evaluation of chemotherapy tolerability for metastatic pancreatic cancer.   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  He has had CT scan done during interval.  He has no new complaints.  Appetite is good.  He has lost 6 pounds since last visit. .. Review of Systems  Constitutional: Positive for fatigue. Negative for appetite change, chills, fever and unexpected weight change.  HENT:   Negative for hearing loss and voice change.   Eyes: Negative for eye problems and icterus.  Respiratory: Negative for chest tightness, cough and shortness of breath.   Cardiovascular: Negative for chest pain and leg swelling.  Gastrointestinal: Negative for abdominal distention and abdominal pain.  Endocrine: Negative for hot flashes.  Genitourinary: Negative for difficulty urinating, dysuria and frequency.   Musculoskeletal: Negative for  arthralgias.  Skin: Negative for   itching and rash.  Neurological: Positive for numbness. Negative for light-headedness.  Hematological: Negative for adenopathy. Does not bruise/bleed easily.  Psychiatric/Behavioral: Negative for confusion.    MEDICAL HISTORY:  Past Medical History:  Diagnosis Date  . Allergic rhinitis   . CHF (congestive heart failure) ([place])   . Chronic cholecystitis   . Coronary artery disease   . DDD (degenerative disc disease), cervical   . Dehydration 06/21/2019  . Diabetes mellitus without complication ([place])   . Dyspnea   . Early cataract   . Erectile dysfunction   . GERD (gastroesophageal reflux disease)   . Gouty arthritis   . Headache    occasional migraines  . Hypercalcemia   . Hyperlipemia   . Hypertension   . Palpitations   . Pancreatic cancer metastasized to liver ([place]) 05/2019   Chemo tx's  . Peripheral vascular disease ([place])   . 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   LIMA to LAD SVG to [place] SVG SEQUENTIALLY to OM1 and OM2 SVG to ACUTE MARGINAL  . Spinal stenosis of cervical region   . Vitamin D deficiency     SURGICAL HISTORY: Past Surgical History:  Procedure Laterality Date  . BACK SURGERY  2018    fusion with screws  . CHOLECYSTECTOMY N/A 11/13/2018   Procedure: LAPAROSCOPIC CHOLECYSTECTOMY;  Surgeon: Vickie Epley, MD;  Location: [facility];  Service: General;  Laterality: N/A;  . COLONOSCOPY    . CORONARY ARTERY BYPASS GRAFT N/A 09/30/2017   Procedure: CORONARY ARTERY BYPASS GRAFTING times five using right and left Saphaneous vein harvested endoscopicly  and left internal mammary artery. (CABG),TEE;  Surgeon: Rexene Alberts, MD;  Location: [place];  Service: Open Heart Surgery;  Laterality: N/A;  . LEFT HEART CATH AND CORONARY ANGIOGRAPHY Left 09/06/2017   Procedure: LEFT HEART CATH AND CORONARY ANGIOGRAPHY;  Surgeon: Corey Skains, MD;  Location: [place] [facility];  Service: Cardiovascular;  Laterality: Left;  . percutaneous  transluminal balloon angioplasty  01/2010   of left lower extremity  . PORTA CATH INSERTION N/A 06/06/2019   Procedure: PORTA CATH INSERTION;  Surgeon: Katha Cabal, MD;  Location: [place] [facility];  Service: Cardiovascular;  Laterality: N/A;  . TEE WITHOUT CARDIOVERSION N/A 09/30/2017   Procedure: TRANSESOPHAGEAL ECHOCARDIOGRAM (TEE);  Surgeon: Rexene Alberts, MD;  Location: [place];  Service: Open Heart Surgery;  Laterality: N/A;  . VASCULAR SURGERY Left 2010   left exernal iliac and superficial femoral artery PTCA and stenting    SOCIAL HISTORY: Social History   Socioeconomic History  . Marital status: Married    Spouse name: Enid Derry  . Number of children: 2  . Years of education: Not on file  . Highest education level: 5th grade  Occupational History  . Occupation: drove tractors    Comment: retired  Tobacco Use  . Smoking status: Former Smoker    Packs/day: 0.75    Years: 50.00    Pack years: 37.50    Types: Cigarettes    Quit date: 09/27/2017    Years since quitting: 2.7  . Smokeless tobacco: Former Systems developer    Types: Secondary school teacher  . Vaping Use: Never used  Substance and Sexual Activity  . Alcohol use: Not Currently    Comment: stopped drinking before cabg  . Drug use: Not Currently    Types: Marijuana    Comment: stopped smoking before CABG  . Sexual activity: Not Currently  Other Topics Concern  . Not on file  Social History Narrative  . Not on file   Social Determinants of Health   Financial Resource Strain: Low Risk   . Difficulty of Paying Living Expenses: Not hard at all  Food Insecurity: No Food Insecurity  . Worried About Running Out of Food in the Last Year: Never true  . Ran Out of Food in the Last Year: Never true  Transportation Needs: No Transportation Needs  . Lack of Transportation (Medical): No  . Lack of Transportation (Non-Medical): No  Physical Activity: Inactive  . Days of Exercise per Week: 0 days  . Minutes of Exercise per  Session: 0 min  Stress: No Stress Concern Present  . Feeling of Stress : Not at all  Social Connections: Moderately Isolated  . Frequency of Communication with Friends and Family: More than three times a week  . Frequency of Social Gatherings with Friends and Family: More than three times a week  . Attends Religious Services: Never  . Active Member of Clubs or Organizations: No  . Attends Club or Organization Meetings: Never  . Marital Status: Married  Intimate Partner Violence: Not At Risk  . Fear of Current or Ex-Partner: No  . Emotionally Abused: No  . Physically Abused: No  . Sexually Abused: No    FAMILY HISTORY: Family History  Problem Relation Age of Onset  . Brain cancer Mother   . Emphysema Father   . Cancer Brother     ALLERGIES:  is allergic to lipitor [atorvastatin], zetia [ezetimibe], lisinopril, protonix [pantoprazole], and spironolactone.  MEDICATIONS:  Current Outpatient Medications  Medication Sig Dispense Refill  . aspirin EC 81 MG tablet Take 81 mg by mouth daily.    . carvedilol (COREG) 6.25 MG tablet Take 6.25 mg by mouth 2 (two) times daily with a meal.  3  . Continuous Blood Gluc Receiver (FREESTYLE LIBRE 14 DAY READER) DEVI 1 Device by Does not apply route every 14 (fourteen) days. 6 each 1  . diltiazem (CARDIZEM CD) 300 MG 24 hr capsule TAKE 1 CAPSULE BY MOUTH EVERY DAY 90 capsule 1  . dorzolamide-timolol (COSOPT) 22.3-6.8 MG/ML ophthalmic solution INSTILL ONE DROP INTO BOTH EYES TWICE DAILY    . DULoxetine (CYMBALTA) 30 MG capsule Take 2 capsules (60 mg total) by mouth daily. 60 capsule 1  . ferrous sulfate 325 (65 FE) MG tablet TAKE 1 TABLET BY MOUTH EVERY DAY WITH BREAKFAST 90 tablet 1  . ibuprofen (ADVIL) 800 MG tablet Take 1 tablet (800 mg total) by mouth every 8 (eight) hours as needed. 30 tablet 0  . insulin glargine (LANTUS SOLOSTAR) 100 UNIT/ML Solostar Pen Inject 20 Units into the skin at bedtime. (Patient taking differently: Inject 16 Units  into the skin at bedtime. ) 15 mL 0  . insulin lispro (HUMALOG) 100 UNIT/ML KwikPen Inject 0.05 mLs (5 Units total) into the skin 3 (three) times daily with meals. (Patient not taking: Reported on 04/15/2020) 15 mL 11  . Insulin Pen Needle (PEN NEEDLES) 32G X 5 MM MISC Use four times daily for insulin 400 each 1  . latanoprost (XALATAN) 0.005 % ophthalmic solution Place 1 drop into both eyes at bedtime.   3  . lidocaine-prilocaine (EMLA) cream APPLY TO AFFECTED AREA ONCE AS DIRECTED    . Multiple Vitamin (MULTIVITAMIN WITH MINERALS) TABS tablet Take 1 tablet by mouth daily.    . OMEGA-3 FATTY ACIDS PO Take 1,200 mg by mouth daily.     . omeprazole (PRILOSEC) 40 MG capsule TAKE 1 CAPSULE   BY MOUTH EVERY DAY 90 capsule 1  . ONETOUCH VERIO test strip TEST FASTING SUGAR EVERY MORNING 100 strip 3  . oxyCODONE (OXY IR/ROXICODONE) 5 MG immediate release tablet Take 1 tablet (5 mg total) by mouth every 6 (six) hours as needed for severe pain. (Patient not taking: Reported on 04/15/2020) 30 tablet 0  . potassium chloride (KLOR-CON) 10 MEQ tablet TAKE 1 TABLET BY MOUTH EVERY DAY 30 tablet 2  . rosuvastatin (CRESTOR) 10 MG tablet Take 1 tablet by mouth daily.    [name] Kitchen triamcinolone ointment (KENALOG) 0.5 % APPLY 1 APPLICATION TOPICALLY 3 (THREE) TIMES DAILY. 30 g 0   No current facility-administered medications for this visit.   Facility-Administered Medications Ordered in Other Visits  Medication Dose Route Frequency Provider Last Rate Last Admin  . sodium chloride flush (NS) 0.9 % injection 10 mL  10 mL Intracatheter PRN Earlie Server, MD      . sodium chloride flush (NS) 0.9 % injection 10 mL  10 mL Intravenous Once Earlie Server, MD      . sodium chloride flush (NS) 0.9 % injection 10 mL  10 mL Intravenous PRN Earlie Server, MD         PHYSICAL EXAMINATION: ECOG PERFORMANCE STATUS: 1 - Symptomatic but completely ambulatory There were no vitals filed for this visit. There were no vitals filed for this visit.  Physical  Exam Constitutional:      General: He is not in acute distress. HENT:     Head: Normocephalic and atraumatic.  Eyes:     General: No scleral icterus. Cardiovascular:     Rate and Rhythm: Normal rate and regular rhythm.     Heart sounds: Normal heart sounds.  Pulmonary:     Effort: Pulmonary effort is normal. No respiratory distress.     Breath sounds: No wheezing.  Abdominal:     General: Bowel sounds are normal. There is no distension.     Palpations: Abdomen is soft.  Musculoskeletal:        General: No deformity. Normal range of motion.     Cervical back: Normal range of motion and neck supple.  Skin:    General: Skin is warm and dry.     Findings: No erythema or rash.  Neurological:     Mental Status: He is alert and oriented to person, place, and time. Mental status is at baseline.     Cranial Nerves: No cranial nerve deficit.     Coordination: Coordination normal.  Psychiatric:        Mood and Affect: Mood normal.     LABORATORY DATA:  I have reviewed the data as listed Lab Results  Component Value Date   WBC 5.3 05/27/2020   HGB 11.2 (L) 05/27/2020   HCT 34.1 (L) 05/27/2020   MCV 92.4 05/27/2020   PLT 192 05/27/2020   Recent Labs    04/29/20 0809 05/13/20 0813 05/27/20 0818  NA 140 140 139  K 4.2 4.1 4.0  CL 109 106 107  CO2 _0 GLUCOSE 148* 177* 155*  BUN 21 19 25*  CREATININE 1.09 1.07 1.29*  CALCIUM 9.1 9.0 9.1  GFRNONAA >60 >60 55*  GFRAA >60 >60 >60  PROT 6.4* 6.5 6.8  ALBUMIN 3.6 3.6 3.7  AST 18 21 37  ALT 17 24 46*  ALKPHOS 64 74 80  BILITOT 0.6 0.6 0.7   Iron/TIBC/Ferritin/ %Sat    Component Value Date/Time   IRON 119 04/27/2019 0940   TIBC  363 04/27/2019 0940   FERRITIN 58 04/27/2019 0940   IRONPCTSAT 33 04/27/2019 0940      RADIOGRAPHIC STUDIES: I have personally reviewed the radiological images as listed and agreed with the findings in the report. CT Chest W Contrast  Result Date: 04/23/2020 CLINICAL DATA:  Restaging  metastatic pancreatic cancer. Interval chemotherapy. EXAM: CT CHEST, ABDOMEN, AND PELVIS WITH CONTRAST TECHNIQUE: Multidetector CT imaging of the chest, abdomen and pelvis was performed following the standard protocol during bolus administration of intravenous contrast. CONTRAST:  100mL OMNIPAQUE IOHEXOL 300 MG/ML  SOLN COMPARISON:  01/02/2020 FINDINGS: CT CHEST FINDINGS Cardiovascular: Right Port-A-Cath tip: SVC. Coronary, aortic arch, and branch vessel atherosclerotic vascular disease. Prior CABG. Mediastinum/Nodes: Left infrahilar node 0.9 cm in short axis on image 30/2, previously the same by my measurements. Right paratracheal node 0.8 cm in short axis on image 13/2, previously 0.9 cm by my measurements. Lungs/Pleura: Ill-defined 0.5 cm subpleural nodule in the right upper lobe on image 66/3 likely corresponding to the slightly more solid-appearing 0.5 cm nodule shown on the prior exam. 3 mm subpleural nodule in the right upper lobe on image 67/3, stable. Stable 3 mm right middle lobe subpleural nodule. 0.5 cm right lower lobe subpleural nodule on image 102/3, stable. Other minimal subpleural nodularity noted on the right side, stable. A pleural mass along the left hemidiaphragm measures 2.1 cm in short axis thickness on image 52/4, previously the same. This was previously not hypermetabolic on 06/04/2019. Musculoskeletal: Thoracic spondylosis. Partial fusion of the T3-4 level including the posterior elements, likely congenital. Stable anterior wedge compression at T5. CT ABDOMEN PELVIS FINDINGS Hepatobiliary: Scattered hepatic metastatic lesions are present. Ill-defined margins, but roughly similar size and distribution to the 01/02/2020 exam. A index right hepatic lobe lesion measuring 2.2 cm transversely on image 49/2 is similar in overall size. Most of the lesions have slightly less indistinct borders today compared to previous. Cholecystectomy.  No biliary dilatation noted. Pancreas: Reduced size of the  pancreas potentially from atrophy or resolution of prior inflammation, I favor the former. Mildly prominent dorsal pancreatic duct extending to the somewhat prominent pancreatic head which appears to have accentuated activity compared to the rest of the pancreatic parenchyma. Presumed mass in the pancreatic head region measures proximally 3.6 by 2.8 cm on image 66/3, this was previously larger at about 4.2 by 3.8 cm on 01/02/2020. Admittedly the margins between this lesion and rest of the pancreas are difficult to discern, and I could be including some normal pancreatic tissue in these measurements. Spleen: Unremarkable Adrenals/Urinary Tract: Both adrenal glands appear normal. Bilateral renal cysts are identified. Superficial to a left mid kidney cyst on image 64/2, and more complex lesion measuring 1.5 by 0.8 cm is stable, and is probably a complex cyst based on the pre contrast hyperdensity shown on the PET-CT of 06/04/2019. Other potentially complex left renal cysts are present, and were likewise hyperdense on prior PET-CT. Urinary bladder unremarkable. Stomach/Bowel: Gastric wall thickening particularly along the greater curvature. This was present on prior PET-CT of 06/04/2019 as well, but not hypermetabolic, and may be primarily from nondistention. Vascular/Lymphatic: Aortoiliac atherosclerotic vascular disease. Portacaval node 0.9 cm in short axis on image 59/2, previously 1.0 cm by my measurement. Reproductive: Borderline prostatomegaly. Other: No supplemental non-categorized findings. Musculoskeletal: Posterolateral rod and pedicle screw fixation with interbody spacer at the L4-5 level. No findings of osseous metastatic disease. IMPRESSION: 1. Overall similar appearance of the scattered hepatic metastatic lesions. Mass in the pancreatic head region is moderately   reduced in size compared to the prior exam. 2. Worsening pancreatic atrophy. 3. Stable pleural based mass along the left hemidiaphragm. 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 4. Several tiny subpleural pulmonary nodules are stable. 5. Gastric wall thickening particularly along the greater curvature, probably from nondistention. This was present but not hypermetabolic on prior PET-CT of 06/04/2019. 6. Bilateral renal cysts, of varying complexity on the left. 7. Aortic atherosclerosis. Aortic Atherosclerosis (ICD10-I70.0). Electronically Signed   By: Walter  Liebkemann M.D.   On: 04/23/2020 15:01   CT Abdomen Pelvis W Contrast  Result Date: 04/23/2020 CLINICAL DATA:  Restaging metastatic pancreatic cancer. Interval chemotherapy. EXAM: CT CHEST, ABDOMEN, AND PELVIS WITH CONTRAST TECHNIQUE: Multidetector CT imaging of the chest, abdomen and pelvis was performed following the standard protocol during bolus administration of intravenous contrast. CONTRAST:  100mL OMNIPAQUE IOHEXOL 300 MG/ML  SOLN COMPARISON:  01/02/2020 FINDINGS: CT CHEST FINDINGS Cardiovascular: Right Port-A-Cath tip: SVC. Coronary, aortic arch, and branch vessel atherosclerotic vascular disease. Prior CABG. Mediastinum/Nodes: Left infrahilar node 0.9 cm in short axis on image 30/2, previously the same by my measurements. Right paratracheal node 0.8 cm in short axis on image 13/2, previously 0.9 cm by my measurements. Lungs/Pleura: Ill-defined 0.5 cm subpleural nodule in the right upper lobe on image 66/3 likely corresponding to the slightly more solid-appearing 0.5 cm nodule shown on the prior exam. 3 mm subpleural nodule in the right upper lobe on image 67/3, stable. Stable 3 mm right middle lobe subpleural nodule. 0.5 cm right lower lobe subpleural nodule on image 102/3, stable. Other minimal subpleural nodularity noted on the right side, stable. A pleural mass along the left hemidiaphragm measures 2.1 cm in short axis thickness on image 52/4, previously the same. This was previously not hypermetabolic on 06/04/2019.  Musculoskeletal: Thoracic spondylosis. Partial fusion of the T3-4 level including the posterior elements, likely congenital. Stable anterior wedge compression at T5. CT ABDOMEN PELVIS FINDINGS Hepatobiliary: Scattered hepatic metastatic lesions are present. Ill-defined margins, but roughly similar size and distribution to the 01/02/2020 exam. A index right hepatic lobe lesion measuring 2.2 cm transversely on image 49/2 is similar in overall size. Most of the lesions have slightly less indistinct borders today compared to previous. Cholecystectomy.  No biliary dilatation noted. Pancreas: Reduced size of the pancreas potentially from atrophy or resolution of prior inflammation, I favor the former. Mildly prominent dorsal pancreatic duct extending to the somewhat prominent pancreatic head which appears to have accentuated activity compared to the rest of the pancreatic parenchyma. Presumed mass in the pancreatic head region measures proximally 3.6 by 2.8 cm on image 66/3, this was previously larger at about 4.2 by 3.8 cm on 01/02/2020. Admittedly the margins between this lesion and rest of the pancreas are difficult to discern, and I could be including some normal pancreatic tissue in these measurements. Spleen: Unremarkable Adrenals/Urinary Tract: Both adrenal glands appear normal. Bilateral renal cysts are identified. Superficial to a left mid kidney cyst on image 64/2, and more complex lesion measuring 1.5 by 0.8 cm is stable, and is probably a complex cyst based on the pre contrast hyperdensity shown on the PET-CT of 06/04/2019. Other potentially complex left renal cysts are present, and were likewise hyperdense on prior PET-CT. Urinary bladder unremarkable. Stomach/Bowel: Gastric wall thickening particularly along the greater curvature. This was present on prior PET-CT of 06/04/2019 as well, but not hypermetabolic, and may be primarily from nondistention. Vascular/Lymphatic: Aortoiliac atherosclerotic vascular    disease. Portacaval node 0.9 cm in short axis on image 59/2, previously 1.0 cm by my measurement. Reproductive: Borderline prostatomegaly. Other: No supplemental non-categorized findings. Musculoskeletal: Posterolateral rod and pedicle screw fixation with interbody spacer at the L4-5 level. No findings of osseous metastatic disease. IMPRESSION: 1. Overall similar appearance of the scattered hepatic metastatic lesions. Mass in the pancreatic head region is moderately reduced in size compared to the prior exam. 2. Worsening pancreatic atrophy. 3. Stable pleural based mass along the left hemidiaphragm. 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 4. Several tiny subpleural pulmonary nodules are stable. 5. Gastric wall thickening particularly along the greater curvature, probably from nondistention. This was present but not hypermetabolic on prior PET-CT of 06/04/2019. 6. Bilateral renal cysts, of varying complexity on the left. 7. Aortic atherosclerosis. Aortic Atherosclerosis (ICD10-I70.0). Electronically Signed   By: Walter  Liebkemann M.D.   On: 04/23/2020 15:01   CT CHEST ABDOMEN PELVIS W CONTRAST  Result Date: 06/04/2020 CLINICAL DATA:  Pancreatic cancer.  On going chemotherapy EXAM: CT CHEST, ABDOMEN, AND PELVIS WITH CONTRAST TECHNIQUE: Multidetector CT imaging of the chest, abdomen and pelvis was performed following the standard protocol during bolus administration of intravenous contrast. CONTRAST:  100mL OMNIPAQUE IOHEXOL 300 MG/ML  SOLN COMPARISON:  CT 04/24/2019 FINDINGS: CT CHEST FINDINGS Cardiovascular: Port in the anterior chest wall with tip in distal SVC. Post CABG Mediastinum/Nodes: No axillary or supraclavicular adenopathy. No mediastinal or hilar adenopathy. No pericardial fluid. Esophagus normal. Lungs/Pleura: Several small subpleural nodules in the RIGHT lower lobe are unchanged. Largest nodule measures 3 mm on image 91/4. Again  demonstrated lesion along the medial LEFT pleural surface adjacent to the descending thoracic aorta measuring 4.2 cm and unchanged prior (image 46/2). Musculoskeletal: No aggressive osseous lesion. CT ABDOMEN AND PELVIS FINDINGS Hepatobiliary: Multiple poorly marginated hypoenhancing lesions are present within the LEFT RIGHT hepatic lobe. Visually these lesions appear increased in size compared to prior. For example lesion the posterior RIGHT hepatic lobe measuring approximately 3.4 cm in diameter (image 47/2) compares to 2.1 cm. Lesion in the anterior aspect RIGHT hepatic lobe measuring 2.7 cm (image 47) compares with 1.9 cm. Small lesion in the caudate lobe measuring 1.1 cm compares to 0.5 cm. Postcholecystectomy Pancreas: Fullness in the head of the pancreas measuring 3.0 x 2.7 cm compares with 3.2 x 2.2 cm for some decrease in size. Atrophy of body and tail the pancreas. Spleen: Normal spleen Adrenals/urinary tract: Adrenal glands and kidneys are normal. The ureters and bladder normal. Stomach/Bowel: Stomach, small bowel, appendix, and cecum are normal. The colon and rectosigmoid colon are normal. Vascular/Lymphatic: Abdominal aorta is normal caliber. There is no retroperitoneal or periportal lymphadenopathy. No pelvic lymphadenopathy. Reproductive: Prostate normal Other: No free fluid. Musculoskeletal: No aggressive osseous lesion. IMPRESSION: 1. Moderate increase in size of multifocal hepatic metastasis. 2. Mass in the head of the pancreas measures slightly smaller. 3. No evidence of new metastatic disease in the abdomen pelvis. 4. Stable small subpleural nodules in the RIGHT lower lobe. 5. Stable pleural mass in the LEFT lower lobe may represent a benign pleural tumor. Electronically Signed   By: Stewart  Edmunds M.D.   On: 06/04/2020 12:16      ASSESSMENT & PLAN:  1. Pancreatic cancer metastasized to liver (HCC)   2. Encounter for antineoplastic chemotherapy   3. Anemia due to antineoplastic  chemotherapy   4. Neuropathy   5. Goals of care, counseling/discussion    #Metastatic pancreatic   cancer- liver mets, hilar nodal metastasis, ?bone mets, CA 19-9 nadir at 6679, now  trend up to 20 627 Unfortunately 06/04/2020 CT chest abdomen pelvis.  Showed disease progression with moderate increase in size of multifocal hepatic metastasis, mass in the head of the pancreas measures slightly smaller.  No evidence of new metastatic disease in the abdomen or pelvis.  Stable small subpleural nodules in the right lower lobe.  Stable pleural mass in the left lower lobe.  Patient has been on third line treatment with 5-FU and liposomal irinotecan.  Given that his disease has progressed and with dramatic increase of his tumor marker, I recommend to switch to fourth line of treatment with FOLFOX.  His major obstacle will be potential worsening of neuropathy.  Continue Cymbalta. Labs are reviewed and discussed with patient.  5-FU bolus will be omitted due to cytopenia. Rationale and potential side effects of fourth line chemotherapy treatment FOLFOX were discussed with patient and he agrees to proceed. Goals of care, patient is made aware that his disease is very aggressive and he has progressed through 3 lines of treatment.  His disease is not curable and prognosis is poor.  Anticipated life expectancy is less than 6 months.  We will see how he does on fourth line chemotherapy treatments.  He should continue follow-up with palliative care service.  He appreciated the discussion.   #Chronic hypokalemia, potassium is stable at 4.1 today., continue potassium chloride 10 mEq daily.   #  diabetes/ hyperglycemia, hyperglycemia is better controlled now on insulin regimen.  Today's glucose level is 148. Continue follow-up with primary care provider #Neuropathy, continue Cymbalta 60 mg daily.  #Normocytic anemia, likely secondary to chemo.  Hemoglobin is 10.7, continue to monitor.  #All questions were answered.  The patient knows to call the clinic with any problems questions or concerns. Follow-up 1 week for assessment of treatment tolerability and 2 weeks for evaluation prior to next cycle of treatment.   Earlie Server, MD, PhD Hematology Oncology [place] [facility] 06/10/2020

## 2020-06-10 NOTE — Telephone Encounter (Signed)
Requested Prescriptions  Pending Prescriptions Disp Refills  . omeprazole (PRILOSEC) 40 MG capsule [Pharmacy Med Name: OMEPRAZOLE DR 40 MG CAPSULE] 90 capsule 1    Sig: TAKE 1 CAPSULE BY MOUTH EVERY DAY     Gastroenterology: Proton Pump Inhibitors Passed - 06/10/2020  2:28 PM      Passed - Valid encounter within last 12 months    Recent Outpatient Visits          3 months ago Type 1 diabetes mellitus with hyperglycemia [facility])   [place], [place], PA-C   4 months ago Uncontrolled type 2 diabetes mellitus with hyperglycemia [facility])   [place], [place], PA-C   4 months ago Uncontrolled type 2 diabetes mellitus with hyperglycemia [facility])   [place], [place], PA-C   4 months ago Diabetes mellitus without complication [facility])   [place], [place], PA-C   4 months ago Uncontrolled type 2 diabetes mellitus with hyperglycemia [facility])   [place], [place], [place]

## 2020-06-10 NOTE — Progress Notes (Signed)
Patient reports in the past week he had 4 days of bloating and gas after eating.

## 2020-06-11 ENCOUNTER — Other Ambulatory Visit: Payer: Self-pay | Admitting: Physician Assistant

## 2020-06-11 DIAGNOSIS — D649 Anemia, unspecified: Secondary | ICD-10-CM

## 2020-06-11 DIAGNOSIS — R5383 Other fatigue: Secondary | ICD-10-CM

## 2020-06-11 NOTE — Telephone Encounter (Signed)
Requested  medications are  due for refill today yes  Requested medications are on the active medication list yes  Last refill 6/4   Future visit scheduled no  Notes to clinic Failed protocol of Fe lab within 360 days and could not find med/dx addressed in visit within 12 months.Please assess.

## 2020-06-12 ENCOUNTER — Other Ambulatory Visit: Payer: Self-pay

## 2020-06-12 ENCOUNTER — Inpatient Hospital Stay: Payer: Medicare Other | Attending: Oncology

## 2020-06-12 VITALS — BP 142/68 | HR 74 | Temp 96.7°F | Resp 18

## 2020-06-12 DIAGNOSIS — Z809 Family history of malignant neoplasm, unspecified: Secondary | ICD-10-CM | POA: Diagnosis not present

## 2020-06-12 DIAGNOSIS — C771 Secondary and unspecified malignant neoplasm of intrathoracic lymph nodes: Secondary | ICD-10-CM | POA: Diagnosis not present

## 2020-06-12 DIAGNOSIS — I700 Atherosclerosis of aorta: Secondary | ICD-10-CM | POA: Diagnosis not present

## 2020-06-12 DIAGNOSIS — E119 Type 2 diabetes mellitus without complications: Secondary | ICD-10-CM | POA: Diagnosis not present

## 2020-06-12 DIAGNOSIS — Z87891 Personal history of nicotine dependence: Secondary | ICD-10-CM | POA: Insufficient documentation

## 2020-06-12 DIAGNOSIS — Z5111 Encounter for antineoplastic chemotherapy: Secondary | ICD-10-CM | POA: Insufficient documentation

## 2020-06-12 DIAGNOSIS — R5383 Other fatigue: Secondary | ICD-10-CM | POA: Insufficient documentation

## 2020-06-12 DIAGNOSIS — K219 Gastro-esophageal reflux disease without esophagitis: Secondary | ICD-10-CM | POA: Insufficient documentation

## 2020-06-12 DIAGNOSIS — E611 Iron deficiency: Secondary | ICD-10-CM | POA: Insufficient documentation

## 2020-06-12 DIAGNOSIS — C259 Malignant neoplasm of pancreas, unspecified: Secondary | ICD-10-CM

## 2020-06-12 DIAGNOSIS — D6481 Anemia due to antineoplastic chemotherapy: Secondary | ICD-10-CM | POA: Insufficient documentation

## 2020-06-12 DIAGNOSIS — Z808 Family history of malignant neoplasm of other organs or systems: Secondary | ICD-10-CM | POA: Insufficient documentation

## 2020-06-12 DIAGNOSIS — C257 Malignant neoplasm of other parts of pancreas: Secondary | ICD-10-CM | POA: Insufficient documentation

## 2020-06-12 DIAGNOSIS — Z79899 Other long term (current) drug therapy: Secondary | ICD-10-CM | POA: Insufficient documentation

## 2020-06-12 DIAGNOSIS — G629 Polyneuropathy, unspecified: Secondary | ICD-10-CM | POA: Diagnosis not present

## 2020-06-12 DIAGNOSIS — I10 Essential (primary) hypertension: Secondary | ICD-10-CM | POA: Insufficient documentation

## 2020-06-12 DIAGNOSIS — Z836 Family history of other diseases of the respiratory system: Secondary | ICD-10-CM | POA: Insufficient documentation

## 2020-06-12 DIAGNOSIS — I2510 Atherosclerotic heart disease of native coronary artery without angina pectoris: Secondary | ICD-10-CM | POA: Diagnosis not present

## 2020-06-12 DIAGNOSIS — T451X5A Adverse effect of antineoplastic and immunosuppressive drugs, initial encounter: Secondary | ICD-10-CM | POA: Diagnosis not present

## 2020-06-12 DIAGNOSIS — E876 Hypokalemia: Secondary | ICD-10-CM | POA: Insufficient documentation

## 2020-06-12 DIAGNOSIS — C787 Secondary malignant neoplasm of liver and intrahepatic bile duct: Secondary | ICD-10-CM | POA: Diagnosis not present

## 2020-06-12 MED ORDER — HEPARIN SOD (PORK) LOCK FLUSH 100 UNIT/ML IV SOLN
INTRAVENOUS | Status: AC
  Filled 2020-06-12: qty 5

## 2020-06-12 MED ORDER — SODIUM CHLORIDE 0.9% FLUSH
10.0000 mL | INTRAVENOUS | Status: DC | PRN
  Administered 2020-06-12: 10 mL
  Filled 2020-06-12: qty 10

## 2020-06-12 MED ORDER — HEPARIN SOD (PORK) LOCK FLUSH 100 UNIT/ML IV SOLN
500.0000 [IU] | Freq: Once | INTRAVENOUS | Status: AC | PRN
  Administered 2020-06-12: 500 [IU]
  Filled 2020-06-12: qty 5

## 2020-06-13 NOTE — Telephone Encounter (Signed)
Refill sent.

## 2020-06-18 ENCOUNTER — Encounter: Payer: Self-pay | Admitting: Oncology

## 2020-06-18 ENCOUNTER — Inpatient Hospital Stay: Payer: Medicare Other

## 2020-06-18 ENCOUNTER — Inpatient Hospital Stay (HOSPITAL_BASED_OUTPATIENT_CLINIC_OR_DEPARTMENT_OTHER): Payer: Medicare Other | Admitting: Oncology

## 2020-06-18 ENCOUNTER — Other Ambulatory Visit: Payer: Self-pay

## 2020-06-18 VITALS — BP 131/74 | HR 72 | Temp 97.8°F | Resp 18 | Wt 169.6 lb

## 2020-06-18 DIAGNOSIS — C787 Secondary malignant neoplasm of liver and intrahepatic bile duct: Secondary | ICD-10-CM

## 2020-06-18 DIAGNOSIS — G629 Polyneuropathy, unspecified: Secondary | ICD-10-CM | POA: Diagnosis not present

## 2020-06-18 DIAGNOSIS — C259 Malignant neoplasm of pancreas, unspecified: Secondary | ICD-10-CM | POA: Diagnosis not present

## 2020-06-18 DIAGNOSIS — T451X5A Adverse effect of antineoplastic and immunosuppressive drugs, initial encounter: Secondary | ICD-10-CM

## 2020-06-18 DIAGNOSIS — D6481 Anemia due to antineoplastic chemotherapy: Secondary | ICD-10-CM

## 2020-06-18 DIAGNOSIS — Z5111 Encounter for antineoplastic chemotherapy: Secondary | ICD-10-CM | POA: Diagnosis not present

## 2020-06-18 DIAGNOSIS — Z7189 Other specified counseling: Secondary | ICD-10-CM

## 2020-06-18 LAB — COMPREHENSIVE METABOLIC PANEL
ALT: 76 U/L — ABNORMAL HIGH (ref 0–44)
AST: 36 U/L (ref 15–41)
Albumin: 3.5 g/dL (ref 3.5–5.0)
Alkaline Phosphatase: 84 U/L (ref 38–126)
Anion gap: 11 (ref 5–15)
BUN: 16 mg/dL (ref 8–23)
CO2: 22 mmol/L (ref 22–32)
Calcium: 8.8 mg/dL — ABNORMAL LOW (ref 8.9–10.3)
Chloride: 104 mmol/L (ref 98–111)
Creatinine, Ser: 1.06 mg/dL (ref 0.61–1.24)
GFR calc Af Amer: >60 mL/min (ref >60)
GFR calc non Af Amer: >60 mL/min (ref >60)
Glucose, Bld: 134 mg/dL — ABNORMAL HIGH (ref 70–99)
Potassium: 4 mmol/L (ref 3.5–5.1)
Sodium: 137 mmol/L (ref 135–145)
Total Bilirubin: 0.5 mg/dL (ref 0.3–1.2)
Total Protein: 6.6 g/dL (ref 6.5–8.1)

## 2020-06-18 LAB — CBC WITH DIFFERENTIAL/PLATELET
Abs Immature Granulocytes: 0.22 10*3/uL — ABNORMAL HIGH (ref 0.00–0.07)
Basophils Absolute: 0 10*3/uL (ref 0.0–0.1)
Basophils Relative: 1 %
Eosinophils Absolute: 0.1 10*3/uL (ref 0.0–0.5)
Eosinophils Relative: 3 %
HCT: 33 % — ABNORMAL LOW (ref 39.0–52.0)
Hemoglobin: 11 g/dL — ABNORMAL LOW (ref 13.0–17.0)
Immature Granulocytes: 5 %
Lymphocytes Relative: 18 %
Lymphs Abs: 0.8 10*3/uL (ref 0.7–4.0)
MCH: 30.3 pg (ref 26.0–34.0)
MCHC: 33.3 g/dL (ref 30.0–36.0)
MCV: 90.9 fL (ref 80.0–100.0)
Monocytes Absolute: 0.6 10*3/uL (ref 0.1–1.0)
Monocytes Relative: 13 %
Neutro Abs: 2.9 10*3/uL (ref 1.7–7.7)
Neutrophils Relative %: 60 %
Platelets: 228 10*3/uL (ref 150–400)
RBC: 3.63 MIL/uL — ABNORMAL LOW (ref 4.22–5.81)
RDW: 15 % (ref 11.5–15.5)
WBC: 4.8 10*3/uL (ref 4.0–10.5)
nRBC: 0 % (ref 0.0–0.2)

## 2020-06-18 NOTE — Progress Notes (Signed)
Hematology/Oncology  Follow up note [facility] [phone] [phone]   Patient Care Team: Paulene Floor as PCP - General (Physician Assistant) Clent Jacks, RN as Oncology Nurse Navigator Earlie Server, MD as Consulting Physician (Hematology and Oncology) Neldon Labella, RN as Case Manager Lonia Farber, MD as Consulting Physician (Internal Medicine)  REFERRING PROVIDER: Trinna Post, PA-C  CHIEF COMPLAINTS/REASON FOR VISIT:  Follow up for treatment of pancreatic cancer  HISTORY OF PRESENTING ILLNESS:   Craig [name] is a  74 y.o.  male with PMH listed below, including CABG x5, PVD, hypertension, former tobacco abuse, former alcohol abuse, iron deficiency anemia, was seen in consultation at the request of  Terrilee Croak, Adriana M, PA-C  for evaluation of abnormal CT Patient recently presented to primary care provider complaining for abdominal pain radiating to his back for about 10 days.  Patient uses Tylenol as needed for pain. Work-up showed elevated amylase, lipase, mild anemia with hemoglobin of 11.3 Acute hepatitis panel negative. 05/16/2019 CT abdomen pelvis with contrast showed poorly marginated heterogeneous hypodense 2.9 cm pancreatic mass at the uncinate process, worrisome for primary pancreatic cancer.  No biliary or pancreatic duct dilation. Several ill defined hypodense liver masses scattered throughout the liver, largest 3.1 cm in the segment 6 right liver lobe. Nonspecific portacaval and aortocaval adenopathy. Extreme medial left lung base 4.4 cm pleural-based mass probably a pleural metastasis.  Additional tiny pulmonary nodules scattered at the right lung base are indeterminate. Mild prostate or megaly  He had a history of 37.5-pack-year smoking history quitting 2018. No longer drinking alcohol.  # ultrasound-guided liver biopsy on 05/21/2019.  Pathology showed fragments of benign hepatic parenchyma showing  minimal macro vascular steatosis, otherwise no significant histo pathologic change. Single core of renal tissue showing approximately 25% glomerulosclerosis  # #History of CAD, status post CABG x5.  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  # Omniseq test showed PD-L1 50% TPS, TMB high, negative for BRCA1/2 or NTRK fusion.  MSI could not be completed.  Patient declines genetic testing # 06/07/2019 -1st line chemotherapy Gemcitabine and abraxane. 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 # 11/14/2019- progression, 2nd line treatment with keytruda # 01/07/2020 -progression, start third line chemotherapy treatment 5-FU with liposomal irinotecan 01/24/2020-01/26/2020 patient was admitted to ICU due to DKA. on long-acting insulin 20 units at night and takes sliding scale with meals.  INTERVAL HISTORY Craig [name] is a 75 y.o. male who has above history reviewed by me today presents for evaluation of chemotherapy tolerability for metastatic pancreatic cancer.   Problems and complaints are listed below: Patient reports feeling well. Status post cycle 1 dose reduced FOLFOX.  Fourth line chemotherapy treatment Reports that appetite is good.  No nausea vomiting diarrhea. Denies any pain.  Lost 2 pounds since last visit. 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  No significant worsening of her neuropathy symptoms. .. Review of Systems  Constitutional: Positive for fatigue. Negative for appetite change, chills, fever and unexpected weight change.  HENT:   Negative for hearing loss and voice change.   Eyes: Negative for eye problems and icterus.  Respiratory: Negative for chest tightness, cough and shortness of breath.   Cardiovascular: Negative for chest pain and leg swelling.  Gastrointestinal: Negative for abdominal distention and abdominal pain.   Endocrine: Negative for hot flashes.  Genitourinary: Negative  for difficulty urinating, dysuria and frequency.   Musculoskeletal: Negative for arthralgias.  Skin: Negative for itching and rash.  Neurological: Positive for numbness. Negative for light-headedness.  Hematological: Negative for adenopathy. Does not bruise/bleed easily.  Psychiatric/Behavioral: Negative for confusion.    MEDICAL HISTORY:  Past Medical History:  Diagnosis Date  . Allergic rhinitis   . CHF (congestive heart failure) ([place])   . Chronic cholecystitis   . Coronary artery disease   . DDD (degenerative disc disease), cervical   . Dehydration 06/21/2019  . Diabetes mellitus without complication ([place])   . Dyspnea   . Early cataract   . Erectile dysfunction   . GERD (gastroesophageal reflux disease)   . Gouty arthritis   . Headache    occasional migraines  . Hypercalcemia   . Hyperlipemia   . Hypertension   . Palpitations   . Pancreatic cancer metastasized to liver ([place]) 05/2019   Chemo tx's  . Peripheral vascular disease ([place])   . 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   LIMA to LAD SVG to Bright SVG SEQUENTIALLY to OM1 and OM2 SVG to ACUTE MARGINAL  . Spinal stenosis of cervical region   . Vitamin D deficiency     SURGICAL HISTORY: Past Surgical History:  Procedure Laterality Date  . BACK SURGERY  2018    fusion with screws  . CHOLECYSTECTOMY N/A 11/13/2018   Procedure: LAPAROSCOPIC CHOLECYSTECTOMY;  Surgeon: Vickie Epley, MD;  Location: [facility];  Service: General;  Laterality: N/A;  . COLONOSCOPY    . CORONARY ARTERY BYPASS GRAFT N/A 09/30/2017   Procedure: CORONARY ARTERY BYPASS GRAFTING times five using right and left Saphaneous vein harvested endoscopicly  and left internal mammary artery. (CABG),TEE;  Surgeon: Rexene Alberts, MD;  Location: [place];  Service: Open Heart Surgery;  Laterality: N/A;  . LEFT HEART CATH AND CORONARY ANGIOGRAPHY Left 09/06/2017   Procedure: LEFT HEART CATH AND  CORONARY ANGIOGRAPHY;  Surgeon: Corey Skains, MD;  Location: [place] [facility];  Service: Cardiovascular;  Laterality: Left;  . percutaneous transluminal balloon angioplasty  01/2010   of left lower extremity  . PORTA CATH INSERTION N/A 06/06/2019   Procedure: PORTA CATH INSERTION;  Surgeon: Katha Cabal, MD;  Location: [place] [facility];  Service: Cardiovascular;  Laterality: N/A;  . TEE WITHOUT CARDIOVERSION N/A 09/30/2017   Procedure: TRANSESOPHAGEAL ECHOCARDIOGRAM (TEE);  Surgeon: Rexene Alberts, MD;  Location: [place];  Service: Open Heart Surgery;  Laterality: N/A;  . VASCULAR SURGERY Left 2010   left exernal iliac and superficial femoral artery PTCA and stenting    SOCIAL HISTORY: Social History   Socioeconomic History  . Marital status: Married    Spouse name: Enid Derry  . Number of children: 2  . Years of education: Not on file  . Highest education level: 5th grade  Occupational History  . Occupation: drove tractors    Comment: retired  Tobacco Use  . Smoking status: Former Smoker    Packs/day: 0.75    Years: 50.00    Pack years: 37.50    Types: Cigarettes    Quit date: 09/27/2017    Years since quitting: 2.7  . Smokeless tobacco: Former Systems developer    Types: Secondary school teacher  . Vaping Use: Never used  Substance and Sexual Activity  . Alcohol use: Not Currently    Comment: stopped drinking before cabg  . Drug use: Not Currently    Types: Marijuana    Comment: stopped smoking before  CABG  . Sexual activity: Not Currently  Other Topics Concern  . Not on file  Social History Narrative  . Not on file   Social Determinants of Health   Financial Resource Strain: Low Risk   . Difficulty of Paying Living Expenses: Not hard at all  Food Insecurity: No Food Insecurity  . Worried About Charity fundraiser in the Last Year: Never true  . Ran Out of Food in the Last Year: Never true  Transportation Needs: No Transportation Needs  . Lack of Transportation  (Medical): No  . Lack of Transportation (Non-Medical): No  Physical Activity: Inactive  . Days of Exercise per Week: 0 days  . Minutes of Exercise per Session: 0 min  Stress: No Stress Concern Present  . Feeling of Stress : Not at all  Social Connections: Moderately Isolated  . Frequency of Communication with Friends and Family: More than three times a week  . Frequency of Social Gatherings with Friends and Family: More than three times a week  . Attends Religious Services: Never  . Active Member of Clubs or Organizations: No  . Attends Archivist Meetings: Never  . Marital Status: Married  Human resources officer Violence: Not At Risk  . Fear of Current or Ex-Partner: No  . Emotionally Abused: No  . Physically Abused: No  . Sexually Abused: No    FAMILY HISTORY: Family History  Problem Relation Age of Onset  . Brain cancer Mother   . Emphysema Father   . Cancer Brother     ALLERGIES:  is allergic to lipitor [atorvastatin], zetia [ezetimibe], lisinopril, protonix [pantoprazole], and spironolactone.  MEDICATIONS:  Current Outpatient Medications  Medication Sig Dispense Refill  . aspirin EC 81 MG tablet Take 81 mg by mouth daily.    . carvedilol (COREG) 6.25 MG tablet Take 6.25 mg by mouth 2 (two) times daily with a meal.  3  . Continuous Blood Gluc Receiver (FREESTYLE LIBRE 14 DAY READER) DEVI 1 Device by Does not apply route every 14 (fourteen) days. 6 each 1  . diltiazem (CARDIZEM CD) 300 MG 24 hr capsule TAKE 1 CAPSULE BY MOUTH EVERY DAY 90 capsule 1  . dorzolamide-timolol (COSOPT) 22.3-6.8 MG/ML ophthalmic solution INSTILL ONE DROP INTO BOTH EYES TWICE DAILY    . DULoxetine (CYMBALTA) 30 MG capsule Take 2 capsules (60 mg total) by mouth daily. 60 capsule 1  . ferrous sulfate 325 (65 FE) MG tablet TAKE 1 TABLET BY MOUTH EVERY DAY WITH BREAKFAST 90 tablet 1  . ibuprofen (ADVIL) 800 MG tablet Take 1 tablet (800 mg total) by mouth every 8 (eight) hours as needed. 30 tablet  0  . insulin glargine (LANTUS SOLOSTAR) 100 UNIT/ML Solostar Pen Inject 20 Units into the skin at bedtime. (Patient taking differently: Inject 16 Units into the skin at bedtime. ) 15 mL 0  . insulin lispro (HUMALOG) 100 UNIT/ML KwikPen Inject 0.05 mLs (5 Units total) into the skin 3 (three) times daily with meals. 15 mL 11  . Insulin Pen Needle (PEN NEEDLES) 32G X 5 MM MISC Use four times daily for insulin 400 each 1  . latanoprost (XALATAN) 0.005 % ophthalmic solution Place 1 drop into both eyes at bedtime.   3  . lidocaine-prilocaine (EMLA) cream APPLY TO AFFECTED AREA ONCE AS DIRECTED    . Multiple Vitamin (MULTIVITAMIN WITH MINERALS) TABS tablet Take 1 tablet by mouth daily.    . OMEGA-3 FATTY ACIDS PO Take 1,200 mg by mouth daily.     [name] Kitchen  omeprazole (PRILOSEC) 40 MG capsule TAKE 1 CAPSULE BY MOUTH EVERY DAY 90 capsule 1  . ONETOUCH VERIO test strip TEST FASTING SUGAR EVERY MORNING 100 strip 3  . potassium chloride (KLOR-CON) 10 MEQ tablet TAKE 1 TABLET BY MOUTH EVERY DAY 30 tablet 2  . rosuvastatin (CRESTOR) 10 MG tablet Take 1 tablet by mouth daily.    [name] Kitchen triamcinolone ointment (KENALOG) 0.5 % APPLY 1 APPLICATION TOPICALLY 3 (THREE) TIMES DAILY. 30 g 0  . oxyCODONE (OXY IR/ROXICODONE) 5 MG immediate release tablet Take 1 tablet (5 mg total) by mouth every 6 (six) hours as needed for severe pain. (Patient not taking: Reported on 04/15/2020) 30 tablet 0   No current facility-administered medications for this visit.   Facility-Administered Medications Ordered in Other Visits  Medication Dose Route Frequency Provider Last Rate Last Admin  . sodium chloride flush (NS) 0.9 % injection 10 mL  10 mL Intracatheter PRN Earlie Server, MD      . sodium chloride flush (NS) 0.9 % injection 10 mL  10 mL Intravenous Once Earlie Server, MD         PHYSICAL EXAMINATION: ECOG PERFORMANCE STATUS: 1 - Symptomatic but completely ambulatory Vitals:   06/18/20 0955  BP: 131/74  Pulse: 72  Resp: 18  Temp: 97.8 F (36.6  C)   Filed Weights   06/18/20 0955  Weight: 169 lb 9.6 oz (76.9 kg)    Physical Exam Constitutional:      General: He is not in acute distress. HENT:     Head: Normocephalic and atraumatic.  Eyes:     General: No scleral icterus. Cardiovascular:     Rate and Rhythm: Normal rate and regular rhythm.     Heart sounds: Normal heart sounds.  Pulmonary:     Effort: Pulmonary effort is normal. No respiratory distress.     Breath sounds: No wheezing.  Abdominal:     General: Bowel sounds are normal. There is no distension.     Palpations: Abdomen is soft.  Musculoskeletal:        General: No deformity. Normal range of motion.     Cervical back: Normal range of motion and neck supple.  Skin:    General: Skin is warm and dry.     Findings: No erythema or rash.  Neurological:     Mental Status: He is alert and oriented to person, place, and time. Mental status is at baseline.     Cranial Nerves: No cranial nerve deficit.     Coordination: Coordination normal.  Psychiatric:        Mood and Affect: Mood normal.     LABORATORY DATA:  I have reviewed the data as listed Lab Results  Component Value Date   WBC 4.8 06/18/2020   HGB 11.0 (L) 06/18/2020   HCT 33.0 (L) 06/18/2020   MCV 90.9 06/18/2020   PLT 228 06/18/2020   Recent Labs    05/27/20 0818 06/10/20 0815 06/18/20 0926  NA 139 140 137  K 4.0 4.1 4.0  CL 107 105 104  CO2 $Re'24 25 22  'NcC$ GLUCOSE 155* 148* 134*  BUN 25* 19 16  CREATININE 1.29* 1.12 1.06  CALCIUM 9.1 9.1 8.8*  GFRNONAA 55* >60 >60  GFRAA >60 >60 >60  PROT 6.8 6.5 6.6  ALBUMIN 3.7 3.6 3.5  AST 37 41 36  ALT 46* 76* 76*  ALKPHOS 80 77 84  BILITOT 0.7 0.4 0.5   Iron/TIBC/Ferritin/ %Sat    Component Value Date/Time  IRON 119 04/27/2019 0940   TIBC 363 04/27/2019 0940   FERRITIN 58 04/27/2019 0940   IRONPCTSAT 33 04/27/2019 0940      RADIOGRAPHIC STUDIES: I have personally reviewed the radiological images as listed and agreed with the  findings in the report. CT Chest W Contrast  Result Date: 04/23/2020 CLINICAL DATA:  Restaging metastatic pancreatic cancer. Interval chemotherapy. EXAM: CT CHEST, ABDOMEN, AND PELVIS WITH CONTRAST TECHNIQUE: Multidetector CT imaging of the chest, abdomen and pelvis was performed following the standard protocol during bolus administration of intravenous contrast. CONTRAST:  157mL OMNIPAQUE IOHEXOL 300 MG/ML  SOLN COMPARISON:  01/02/2020 FINDINGS: CT CHEST FINDINGS Cardiovascular: Right Port-A-Cath tip: SVC. Coronary, aortic arch, and branch vessel atherosclerotic vascular disease. Prior CABG. Mediastinum/Nodes: Left infrahilar node 0.9 cm in short axis on image 30/2, previously the same by my measurements. Right paratracheal node 0.8 cm in short axis on image 13/2, previously 0.9 cm by my measurements. Lungs/Pleura: Ill-defined 0.5 cm subpleural nodule in the right upper lobe on image 66/3 likely corresponding to the slightly more solid-appearing 0.5 cm nodule shown on the prior exam. 3 mm subpleural nodule in the right upper lobe on image 67/3, stable. Stable 3 mm right middle lobe subpleural nodule. 0.5 cm right lower lobe subpleural nodule on image 102/3, stable. Other minimal subpleural nodularity noted on the right side, stable. A pleural mass along the left hemidiaphragm measures 2.1 cm in short axis thickness on image 52/4, previously the same. This was previously not hypermetabolic on 54/27/0623. Musculoskeletal: Thoracic spondylosis. Partial fusion of the T3-4 level including the posterior elements, likely congenital. Stable anterior wedge compression at T5. CT ABDOMEN PELVIS FINDINGS Hepatobiliary: Scattered hepatic metastatic lesions are present. Ill-defined margins, but roughly similar size and distribution to the 01/02/2020 exam. A index right hepatic lobe lesion measuring 2.2 cm transversely on image 49/2 is similar in overall size. Most of the lesions have slightly less indistinct borders today  compared to previous. Cholecystectomy.  No biliary dilatation noted. Pancreas: Reduced size of the pancreas potentially from atrophy or resolution of prior inflammation, I favor the former. Mildly prominent dorsal pancreatic duct extending to the somewhat prominent pancreatic head which appears to have accentuated activity compared to the rest of the pancreatic parenchyma. Presumed mass in the pancreatic head region measures proximally 3.6 by 2.8 cm on image 66/3, this was previously larger at about 4.2 by 3.8 cm on 01/02/2020. Admittedly the margins between this lesion and rest of the pancreas are difficult to discern, and I could be including some normal pancreatic tissue in these measurements. Spleen: Unremarkable Adrenals/Urinary Tract: Both adrenal glands appear normal. Bilateral renal cysts are identified. Superficial to a left mid kidney cyst on image 64/2, and more complex lesion measuring 1.5 by 0.8 cm is stable, and is probably a complex cyst based on the pre contrast hyperdensity shown on the PET-CT of 06/04/2019. Other potentially complex left renal cysts are present, and were likewise hyperdense on prior PET-CT. Urinary bladder unremarkable. Stomach/Bowel: Gastric wall thickening particularly along the greater curvature. This was present on prior PET-CT of 06/04/2019 as well, but not hypermetabolic, and may be primarily from nondistention. Vascular/Lymphatic: Aortoiliac atherosclerotic vascular disease. Portacaval node 0.9 cm in short axis on image 59/2, previously 1.0 cm by my measurement. Reproductive: Borderline prostatomegaly. Other: No supplemental non-categorized findings. Musculoskeletal: Posterolateral rod and pedicle screw fixation with interbody spacer at the L4-5 level. No findings of osseous metastatic disease. IMPRESSION: 1. Overall similar appearance of the scattered hepatic metastatic lesions. Mass  in the pancreatic head region is moderately reduced in size compared to the prior exam. 2.  Worsening pancreatic atrophy. 3. Stable pleural based mass along the left hemidiaphragm. 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 4. Several tiny subpleural pulmonary nodules are stable. 5. Gastric wall thickening particularly along the greater curvature, probably from nondistention. This was present but not hypermetabolic on prior PET-CT of 06/04/2019. 6. Bilateral renal cysts, of varying complexity on the left. 7. Aortic atherosclerosis. Aortic Atherosclerosis (ICD10-I70.0). Electronically Signed   By: Van Clines M.D.   On: 04/23/2020 15:01   CT Abdomen Pelvis W Contrast  Result Date: 04/23/2020 CLINICAL DATA:  Restaging metastatic pancreatic cancer. Interval chemotherapy. EXAM: CT CHEST, ABDOMEN, AND PELVIS WITH CONTRAST TECHNIQUE: Multidetector CT imaging of the chest, abdomen and pelvis was performed following the standard protocol during bolus administration of intravenous contrast. CONTRAST:  131mL OMNIPAQUE IOHEXOL 300 MG/ML  SOLN COMPARISON:  01/02/2020 FINDINGS: CT CHEST FINDINGS Cardiovascular: Right Port-A-Cath tip: SVC. Coronary, aortic arch, and branch vessel atherosclerotic vascular disease. Prior CABG. Mediastinum/Nodes: Left infrahilar node 0.9 cm in short axis on image 30/2, previously the same by my measurements. Right paratracheal node 0.8 cm in short axis on image 13/2, previously 0.9 cm by my measurements. Lungs/Pleura: Ill-defined 0.5 cm subpleural nodule in the right upper lobe on image 66/3 likely corresponding to the slightly more solid-appearing 0.5 cm nodule shown on the prior exam. 3 mm subpleural nodule in the right upper lobe on image 67/3, stable. Stable 3 mm right middle lobe subpleural nodule. 0.5 cm right lower lobe subpleural nodule on image 102/3, stable. Other minimal subpleural nodularity noted on the right side, stable. A pleural mass along the left hemidiaphragm measures 2.1 cm in short axis thickness on  image 52/4, previously the same. This was previously not hypermetabolic on 30/86/5784. Musculoskeletal: Thoracic spondylosis. Partial fusion of the T3-4 level including the posterior elements, likely congenital. Stable anterior wedge compression at T5. CT ABDOMEN PELVIS FINDINGS Hepatobiliary: Scattered hepatic metastatic lesions are present. Ill-defined margins, but roughly similar size and distribution to the 01/02/2020 exam. A index right hepatic lobe lesion measuring 2.2 cm transversely on image 49/2 is similar in overall size. Most of the lesions have slightly less indistinct borders today compared to previous. Cholecystectomy.  No biliary dilatation noted. Pancreas: Reduced size of the pancreas potentially from atrophy or resolution of prior inflammation, I favor the former. Mildly prominent dorsal pancreatic duct extending to the somewhat prominent pancreatic head which appears to have accentuated activity compared to the rest of the pancreatic parenchyma. Presumed mass in the pancreatic head region measures proximally 3.6 by 2.8 cm on image 66/3, this was previously larger at about 4.2 by 3.8 cm on 01/02/2020. Admittedly the margins between this lesion and rest of the pancreas are difficult to discern, and I could be including some normal pancreatic tissue in these measurements. Spleen: Unremarkable Adrenals/Urinary Tract: Both adrenal glands appear normal. Bilateral renal cysts are identified. Superficial to a left mid kidney cyst on image 64/2, and more complex lesion measuring 1.5 by 0.8 cm is stable, and is probably a complex cyst based on the pre contrast hyperdensity shown on the PET-CT of 06/04/2019. Other potentially complex left renal cysts are present, and were likewise hyperdense on prior PET-CT. Urinary bladder unremarkable. Stomach/Bowel: Gastric wall thickening particularly along the greater curvature. This was present on prior PET-CT of 06/04/2019 as well, but not hypermetabolic, and may be  primarily from nondistention. Vascular/Lymphatic: Aortoiliac atherosclerotic vascular disease. Portacaval node 0.9 cm in short axis on image 59/2, previously 1.0 cm by my measurement. Reproductive: Borderline prostatomegaly. Other: No supplemental non-categorized findings. Musculoskeletal: Posterolateral rod and pedicle screw fixation with interbody spacer at the L4-5 level. No findings of osseous metastatic disease. IMPRESSION: 1. Overall similar appearance of the scattered hepatic metastatic lesions. Mass in the pancreatic head region is moderately reduced in size compared to the prior exam. 2. Worsening pancreatic atrophy. 3. Stable pleural based mass along the left hemidiaphragm. 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 4. Several tiny subpleural pulmonary nodules are stable. 5. Gastric wall thickening particularly along the greater curvature, probably from nondistention. This was present but not hypermetabolic on prior PET-CT of 06/04/2019. 6. Bilateral renal cysts, of varying complexity on the left. 7. Aortic atherosclerosis. Aortic Atherosclerosis (ICD10-I70.0). Electronically Signed   By: Van Clines M.D.   On: 04/23/2020 15:01   CT CHEST ABDOMEN PELVIS W CONTRAST  Result Date: 06/04/2020 CLINICAL DATA:  Pancreatic cancer.  On going chemotherapy EXAM: CT CHEST, ABDOMEN, AND PELVIS WITH CONTRAST TECHNIQUE: Multidetector CT imaging of the chest, abdomen and pelvis was performed following the standard protocol during bolus administration of intravenous contrast. CONTRAST:  131mL OMNIPAQUE IOHEXOL 300 MG/ML  SOLN COMPARISON:  CT 04/24/2019 FINDINGS: CT CHEST FINDINGS Cardiovascular: Port in the anterior chest wall with tip in distal SVC. Post CABG Mediastinum/Nodes: No axillary or supraclavicular adenopathy. No mediastinal or hilar adenopathy. No pericardial fluid. Esophagus normal. Lungs/Pleura: Several small subpleural nodules in the RIGHT  lower lobe are unchanged. Largest nodule measures 3 mm on image 91/4. Again demonstrated lesion along the medial LEFT pleural surface adjacent to the descending thoracic aorta measuring 4.2 cm and unchanged prior (image 46/2). Musculoskeletal: No aggressive osseous lesion. CT ABDOMEN AND PELVIS FINDINGS Hepatobiliary: Multiple poorly marginated hypoenhancing lesions are present within the LEFT RIGHT hepatic lobe. Visually these lesions appear increased in size compared to prior. For example lesion the posterior RIGHT hepatic lobe measuring approximately 3.4 cm in diameter (image 47/2) compares to 2.1 cm. Lesion in the anterior aspect RIGHT hepatic lobe measuring 2.7 cm (image 47) compares with 1.9 cm. Small lesion in the caudate lobe measuring 1.1 cm compares to 0.5 cm. Postcholecystectomy Pancreas: Fullness in the head of the pancreas measuring 3.0 x 2.7 cm compares with 3.2 x 2.2 cm for some decrease in size. Atrophy of body and tail the pancreas. Spleen: Normal spleen Adrenals/urinary tract: Adrenal glands and kidneys are normal. The ureters and bladder normal. Stomach/Bowel: Stomach, small bowel, appendix, and cecum are normal. The colon and rectosigmoid colon are normal. Vascular/Lymphatic: Abdominal aorta is normal caliber. There is no retroperitoneal or periportal lymphadenopathy. No pelvic lymphadenopathy. Reproductive: Prostate normal Other: No free fluid. Musculoskeletal: No aggressive osseous lesion. IMPRESSION: 1. Moderate increase in size of multifocal hepatic metastasis. 2. Mass in the head of the pancreas measures slightly smaller. 3. No evidence of new metastatic disease in the abdomen pelvis. 4. Stable small subpleural nodules in the RIGHT lower lobe. 5. Stable pleural mass in the LEFT lower lobe may represent a benign pleural tumor. Electronically Signed   By: Suzy Bouchard M.D.   On: 06/04/2020 12:16      ASSESSMENT & PLAN:  1. Pancreatic cancer metastasized to liver ([place])   2. Anemia  due to antineoplastic chemotherapy   3. Neuropathy   4. Goals of care, counseling/discussion    #Metastatic pancreatic  cancer- liver mets, hilar nodal metastasis, ?bone mets, CA 19-9 nadir at 6679, now  trend up to 20 627 S/p cycle 1 dose reduced FOLFOX Clinically he tolerates well.  Her wife is here today and we had a discussion of the disease progression, poor prognosis, goals of care. Patient's wife appreciates the explanation and discussion.  #Chronic hypokalemia, potassium is stable continue potassium 10 mEq daily.   #  diabetes/ hyperglycemia, hyperglycemia is better controlled now on insulin regimen.  Glucose appears to be well controlled.  Continue follow-up with primary care provider #Neuropathy, continue Cymbalta 60 mg daily.  #Normocytic anemia, likely secondary to chemo.  Hemoglobin is 11.  Continue to monitor.  #All questions were answered. The patient knows to call the clinic with any problems questions or concerns. Follow-up 1 week for evaluation prior to next cycle of treatment.   Earlie Server, MD, PhD Hematology Oncology [place] [facility] 06/18/2020

## 2020-06-18 NOTE — Progress Notes (Signed)
Patient denies new problems/concerns today.   °

## 2020-06-24 ENCOUNTER — Other Ambulatory Visit: Payer: Self-pay | Admitting: Oncology

## 2020-06-24 ENCOUNTER — Inpatient Hospital Stay (HOSPITAL_BASED_OUTPATIENT_CLINIC_OR_DEPARTMENT_OTHER): Payer: Medicare Other | Admitting: Oncology

## 2020-06-24 ENCOUNTER — Other Ambulatory Visit: Payer: Self-pay

## 2020-06-24 ENCOUNTER — Inpatient Hospital Stay: Payer: Medicare Other

## 2020-06-24 ENCOUNTER — Encounter: Payer: Self-pay | Admitting: Oncology

## 2020-06-24 VITALS — BP 134/74 | HR 80 | Temp 96.9°F | Resp 18 | Wt 168.7 lb

## 2020-06-24 DIAGNOSIS — Z5111 Encounter for antineoplastic chemotherapy: Secondary | ICD-10-CM

## 2020-06-24 DIAGNOSIS — D6481 Anemia due to antineoplastic chemotherapy: Secondary | ICD-10-CM | POA: Diagnosis not present

## 2020-06-24 DIAGNOSIS — C259 Malignant neoplasm of pancreas, unspecified: Secondary | ICD-10-CM

## 2020-06-24 DIAGNOSIS — G629 Polyneuropathy, unspecified: Secondary | ICD-10-CM

## 2020-06-24 DIAGNOSIS — C787 Secondary malignant neoplasm of liver and intrahepatic bile duct: Secondary | ICD-10-CM

## 2020-06-24 DIAGNOSIS — T451X5A Adverse effect of antineoplastic and immunosuppressive drugs, initial encounter: Secondary | ICD-10-CM

## 2020-06-24 LAB — COMPREHENSIVE METABOLIC PANEL
ALT: 90 U/L — ABNORMAL HIGH (ref 0–44)
AST: 48 U/L — ABNORMAL HIGH (ref 15–41)
Albumin: 3.6 g/dL (ref 3.5–5.0)
Alkaline Phosphatase: 83 U/L (ref 38–126)
Anion gap: 11 (ref 5–15)
BUN: 21 mg/dL (ref 8–23)
CO2: 25 mmol/L (ref 22–32)
Calcium: 9.2 mg/dL (ref 8.9–10.3)
Chloride: 103 mmol/L (ref 98–111)
Creatinine, Ser: 0.98 mg/dL (ref 0.61–1.24)
GFR calc Af Amer: >60 mL/min (ref >60)
GFR calc non Af Amer: >60 mL/min (ref >60)
Glucose, Bld: 149 mg/dL — ABNORMAL HIGH (ref 70–99)
Potassium: 4.4 mmol/L (ref 3.5–5.1)
Sodium: 139 mmol/L (ref 135–145)
Total Bilirubin: 0.6 mg/dL (ref 0.3–1.2)
Total Protein: 6.9 g/dL (ref 6.5–8.1)

## 2020-06-24 LAB — CBC WITH DIFFERENTIAL/PLATELET
Abs Immature Granulocytes: 0.02 10*3/uL (ref 0.00–0.07)
Basophils Absolute: 0.1 10*3/uL (ref 0.0–0.1)
Basophils Relative: 1 %
Eosinophils Absolute: 0.1 10*3/uL (ref 0.0–0.5)
Eosinophils Relative: 3 %
HCT: 33.3 % — ABNORMAL LOW (ref 39.0–52.0)
Hemoglobin: 10.8 g/dL — ABNORMAL LOW (ref 13.0–17.0)
Immature Granulocytes: 0 %
Lymphocytes Relative: 20 %
Lymphs Abs: 1 10*3/uL (ref 0.7–4.0)
MCH: 30 pg (ref 26.0–34.0)
MCHC: 32.4 g/dL (ref 30.0–36.0)
MCV: 92.5 fL (ref 80.0–100.0)
Monocytes Absolute: 0.7 10*3/uL (ref 0.1–1.0)
Monocytes Relative: 14 %
Neutro Abs: 3 10*3/uL (ref 1.7–7.7)
Neutrophils Relative %: 62 %
Platelets: 219 10*3/uL (ref 150–400)
RBC: 3.6 MIL/uL — ABNORMAL LOW (ref 4.22–5.81)
RDW: 15.5 % (ref 11.5–15.5)
WBC: 4.9 10*3/uL (ref 4.0–10.5)
nRBC: 0 % (ref 0.0–0.2)

## 2020-06-24 MED ORDER — PALONOSETRON HCL INJECTION 0.25 MG/5ML
0.2500 mg | Freq: Once | INTRAVENOUS | Status: AC
  Administered 2020-06-24: 0.25 mg via INTRAVENOUS
  Filled 2020-06-24: qty 5

## 2020-06-24 MED ORDER — SODIUM CHLORIDE 0.9% FLUSH
10.0000 mL | Freq: Once | INTRAVENOUS | Status: AC
  Administered 2020-06-24: 10 mL via INTRAVENOUS
  Filled 2020-06-24: qty 10

## 2020-06-24 MED ORDER — DEXTROSE 5 % IV SOLN
Freq: Once | INTRAVENOUS | Status: AC
  Filled 2020-06-24: qty 250

## 2020-06-24 MED ORDER — SODIUM CHLORIDE 0.9 % IV SOLN
10.0000 mg | Freq: Once | INTRAVENOUS | Status: AC
  Administered 2020-06-24: 10 mg via INTRAVENOUS
  Filled 2020-06-24: qty 10

## 2020-06-24 MED ORDER — OXALIPLATIN CHEMO INJECTION 100 MG/20ML
85.0000 mg/m2 | Freq: Once | INTRAVENOUS | Status: AC
  Administered 2020-06-24: 165 mg via INTRAVENOUS
  Filled 2020-06-24: qty 33

## 2020-06-24 MED ORDER — SODIUM CHLORIDE 0.9 % IV SOLN
2400.0000 mg/m2 | INTRAVENOUS | Status: DC
  Administered 2020-06-24: 4700 mg via INTRAVENOUS
  Filled 2020-06-24: qty 94

## 2020-06-24 MED ORDER — LEUCOVORIN CALCIUM INJECTION 350 MG
800.0000 mg | Freq: Once | INTRAVENOUS | Status: AC
  Administered 2020-06-24: 800 mg via INTRAVENOUS
  Filled 2020-06-24: qty 25

## 2020-06-24 NOTE — Progress Notes (Signed)
Hematology/Oncology  Follow up note Craig [name] Craig [name] [phone] [phone]   Patient Care Team: Paulene Floor as PCP - General (Physician Assistant) Clent Jacks, RN as Oncology Nurse Navigator Earlie Server, MD as Consulting Physician (Hematology and Oncology) Neldon Labella, RN as Case Manager Lonia Farber, MD as Consulting Physician (Internal Medicine)  REFERRING PROVIDER: Trinna Post, PA-C  CHIEF COMPLAINTS/REASON FOR VISIT:  Follow up for treatment of pancreatic cancer  HISTORY OF PRESENTING ILLNESS:   Craig [name] is a  74 y.o.  male with PMH listed below, including CABG x5, PVD, hypertension, former tobacco abuse, former alcohol abuse, iron deficiency anemia, was seen in consultation at the request of  Terrilee Croak, Adriana M, PA-C  for evaluation of abnormal CT Patient recently presented to primary care provider complaining for abdominal pain radiating to his back for about 10 days.  Patient uses Tylenol as needed for pain. Work-up showed elevated amylase, lipase, mild anemia with hemoglobin of 11.3 Acute hepatitis panel negative. 05/16/2019 CT abdomen pelvis with contrast showed poorly marginated heterogeneous hypodense 2.9 cm pancreatic mass at the uncinate process, worrisome for primary pancreatic cancer.  No biliary or pancreatic duct dilation. Several ill defined hypodense liver masses scattered throughout the liver, largest 3.1 cm in the segment 6 right liver lobe. Nonspecific portacaval and aortocaval adenopathy. Extreme medial left lung base 4.4 cm pleural-based mass probably a pleural metastasis.  Additional tiny pulmonary nodules scattered at the right lung base are indeterminate. Mild prostate or megaly  He had a history of 37.5-pack-year smoking history quitting 2018. No longer drinking alcohol.  # ultrasound-guided liver biopsy on 05/21/2019.  Pathology showed fragments of benign hepatic parenchyma showing  minimal macro vascular steatosis, otherwise no significant histo pathologic change. Single core of renal tissue showing approximately 25% glomerulosclerosis  # #History of CAD, status post CABG x5.  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  # Omniseq test showed PD-L1 50% TPS, TMB high, negative for BRCA1/2 or NTRK fusion.  MSI could not be completed.  Patient declines genetic testing # 06/07/2019 -1st line chemotherapy Gemcitabine and abraxane. 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 # 11/14/2019- progression, 2nd line treatment with keytruda # 01/07/2020 -progression, start third line chemotherapy treatment 5-FU with liposomal irinotecan 01/24/2020-01/26/2020 patient was admitted to ICU due to DKA. on long-acting insulin 20 units at night and takes sliding scale with meals.  INTERVAL HISTORY Craig [name] is a 74 y.o. male who has above history reviewed by me today presents for evaluation of chemotherapy tolerability for metastatic pancreatic cancer.   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  No numbness or tingling in his finger tips. He tolerates cycle 1 FOLFOX quite well.  Appetite is good.  Weight is relatively stable since last week. .. Review of Systems  Constitutional: Positive for fatigue and unexpected weight change. Negative for appetite change, chills and fever.  HENT:   Negative for hearing loss and voice change.   Eyes: Negative for eye problems and icterus.  Respiratory: Negative for chest tightness, cough and shortness of breath.   Cardiovascular: Negative for chest pain and leg swelling.  Gastrointestinal: Negative for abdominal distention and abdominal pain.  Endocrine: Negative for hot flashes.  Genitourinary: Negative for difficulty urinating, dysuria and frequency.    Musculoskeletal: Negative for arthralgias.  Skin: Negative for itching and rash.  Neurological: Positive for numbness. Negative for light-headedness.  Hematological: Negative for adenopathy. Does not bruise/bleed easily.  Psychiatric/Behavioral: Negative for confusion.    MEDICAL HISTORY:  Past Medical History:  Diagnosis Date  . Allergic rhinitis   . CHF (congestive heart failure) ([place])   . Chronic cholecystitis   . Coronary artery disease   . DDD (degenerative disc disease), cervical   . Dehydration 06/21/2019  . Diabetes mellitus without complication ([place])   . Dyspnea   . Early cataract   . Erectile dysfunction   . GERD (gastroesophageal reflux disease)   . Gouty arthritis   . Headache    occasional migraines  . Hypercalcemia   . Hyperlipemia   . Hypertension   . Palpitations   . Pancreatic cancer metastasized to liver ([place]) 05/2019   Chemo tx's  . Peripheral vascular disease ([place])   . 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   LIMA to LAD SVG to [place] SVG SEQUENTIALLY to OM1 and OM2 SVG to ACUTE MARGINAL  . Spinal stenosis of cervical region   . Vitamin D deficiency     SURGICAL HISTORY: Past Surgical History:  Procedure Laterality Date  . BACK SURGERY  2018    fusion with screws  . CHOLECYSTECTOMY N/A 11/13/2018   Procedure: LAPAROSCOPIC CHOLECYSTECTOMY;  Surgeon: Vickie Epley, MD;  Location: [facility];  Service: General;  Laterality: N/A;  . COLONOSCOPY    . CORONARY ARTERY BYPASS GRAFT N/A 09/30/2017   Procedure: CORONARY ARTERY BYPASS GRAFTING times five using right and left Saphaneous vein harvested endoscopicly  and left internal mammary artery. (CABG),TEE;  Surgeon: Rexene Alberts, MD;  Location: [place];  Service: Open Heart Surgery;  Laterality: N/A;  . LEFT HEART CATH AND CORONARY ANGIOGRAPHY Left 09/06/2017   Procedure: LEFT HEART CATH AND CORONARY ANGIOGRAPHY;  Surgeon: Corey Skains, MD;  Location: [facility];  Service: Cardiovascular;   Laterality: Left;  . percutaneous transluminal balloon angioplasty  01/2010   of left lower extremity  . PORTA CATH INSERTION N/A 06/06/2019   Procedure: PORTA CATH INSERTION;  Surgeon: Katha Cabal, MD;  Location: [place] [facility];  Service: Cardiovascular;  Laterality: N/A;  . TEE WITHOUT CARDIOVERSION N/A 09/30/2017   Procedure: TRANSESOPHAGEAL ECHOCARDIOGRAM (TEE);  Surgeon: Rexene Alberts, MD;  Location: [facility];  Service: Open Heart Surgery;  Laterality: N/A;  . VASCULAR SURGERY Left 2010   left exernal iliac and superficial femoral artery PTCA and stenting    SOCIAL HISTORY: Social History   Socioeconomic History  . Marital status: Married    Spouse name: Enid Derry  . Number of children: 2  . Years of education: Not on file  . Highest education level: 5th grade  Occupational History  . Occupation: drove tractors    Comment: retired  Tobacco Use  . Smoking status: Former Smoker    Packs/day: 0.75    Years: 50.00    Pack years: 37.50    Types: Cigarettes    Quit date: 09/27/2017    Years since quitting: 2.7  . Smokeless tobacco: Former Systems developer    Types: Secondary school teacher  . Vaping Use: Never used  Substance and Sexual Activity  . Alcohol use: Not Currently    Comment: stopped drinking before cabg  . Drug use: Not Currently    Types: Marijuana    Comment: stopped smoking before CABG  . Sexual activity: Not Currently  Other Topics Concern  .  Not on file  Social History Narrative  . Not on file   Social Determinants of Health   Financial Resource Strain: Low Risk   . Difficulty of Paying Living Expenses: Not hard at all  Food Insecurity: No Food Insecurity  . Worried About Programme researcher, broadcasting/film/video in the Last Year: Never true  . Ran Out of Food in the Last Year: Never true  Transportation Needs: No Transportation Needs  . Lack of Transportation (Medical): No  . Lack of Transportation (Non-Medical): No  Physical Activity: Inactive  . Days of Exercise per Week:  0 days  . Minutes of Exercise per Session: 0 min  Stress: No Stress Concern Present  . Feeling of Stress : Not at all  Social Connections: Moderately Isolated  . Frequency of Communication with Friends and Family: More than three times a week  . Frequency of Social Gatherings with Friends and Family: More than three times a week  . Attends Religious Services: Never  . Active Member of Clubs or Organizations: No  . Attends Banker Meetings: Never  . Marital Status: Married  Catering manager Violence: Not At Risk  . Fear of Current or Ex-Partner: No  . Emotionally Abused: No  . Physically Abused: No  . Sexually Abused: No    FAMILY HISTORY: Family History  Problem Relation Age of Onset  . Brain cancer Mother   . Emphysema Father   . Cancer Brother     ALLERGIES:  is allergic to lipitor [atorvastatin], zetia [ezetimibe], lisinopril, protonix [pantoprazole], and spironolactone.  MEDICATIONS:  Current Outpatient Medications  Medication Sig Dispense Refill  . aspirin EC 81 MG tablet Take 81 mg by mouth daily.    . carvedilol (COREG) 6.25 MG tablet Take 6.25 mg by mouth 2 (two) times daily with a meal.  3  . Continuous Blood Gluc Receiver (FREESTYLE LIBRE 14 DAY READER) DEVI 1 Device by Does not apply route every 14 (fourteen) days. 6 each 1  . diltiazem (CARDIZEM CD) 300 MG 24 hr capsule TAKE 1 CAPSULE BY MOUTH EVERY DAY 90 capsule 1  . dorzolamide-timolol (COSOPT) 22.3-6.8 MG/ML ophthalmic solution INSTILL ONE DROP INTO BOTH EYES TWICE DAILY    . DULoxetine (CYMBALTA) 30 MG capsule Take 2 capsules (60 mg total) by mouth daily. 60 capsule 1  . ferrous sulfate 325 (65 FE) MG tablet TAKE 1 TABLET BY MOUTH EVERY DAY WITH BREAKFAST 90 tablet 1  . ibuprofen (ADVIL) 800 MG tablet Take 1 tablet (800 mg total) by mouth every 8 (eight) hours as needed. 30 tablet 0  . insulin glargine (LANTUS SOLOSTAR) 100 UNIT/ML Solostar Pen Inject 20 Units into the skin at bedtime. (Patient  taking differently: Inject 16 Units into the skin at bedtime. ) 15 mL 0  . insulin lispro (HUMALOG) 100 UNIT/ML KwikPen Inject 0.05 mLs (5 Units total) into the skin 3 (three) times daily with meals. 15 mL 11  . Insulin Pen Needle (PEN NEEDLES) 32G X 5 MM MISC Use four times daily for insulin 400 each 1  . latanoprost (XALATAN) 0.005 % ophthalmic solution Place 1 drop into both eyes at bedtime.   3  . lidocaine-prilocaine (EMLA) cream APPLY TO AFFECTED AREA ONCE AS DIRECTED    . Multiple Vitamin (MULTIVITAMIN WITH MINERALS) TABS tablet Take 1 tablet by mouth daily.    . OMEGA-3 FATTY ACIDS PO Take 1,200 mg by mouth daily.     [name] Kitchen omeprazole (PRILOSEC) 40 MG capsule TAKE 1 CAPSULE BY MOUTH  EVERY DAY 90 capsule 1  . ONETOUCH VERIO test strip TEST FASTING SUGAR EVERY MORNING 100 strip 3  . oxyCODONE (OXY IR/ROXICODONE) 5 MG immediate release tablet Take 1 tablet (5 mg total) by mouth every 6 (six) hours as needed for severe pain. (Patient not taking: Reported on 04/15/2020) 30 tablet 0  . potassium chloride (KLOR-CON) 10 MEQ tablet TAKE 1 TABLET BY MOUTH EVERY DAY 30 tablet 2  . rosuvastatin (CRESTOR) 10 MG tablet Take 1 tablet by mouth daily.    [name] Kitchen triamcinolone ointment (KENALOG) 0.5 % APPLY 1 APPLICATION TOPICALLY 3 (THREE) TIMES DAILY. 30 g 0   No current facility-administered medications for this visit.   Facility-Administered Medications Ordered in Other Visits  Medication Dose Route Frequency Provider Last Rate Last Admin  . sodium chloride flush (NS) 0.9 % injection 10 mL  10 mL Intracatheter PRN Earlie Server, MD      . sodium chloride flush (NS) 0.9 % injection 10 mL  10 mL Intravenous Once Earlie Server, MD         PHYSICAL EXAMINATION: ECOG PERFORMANCE STATUS: 1 - Symptomatic but completely ambulatory Vitals:   06/24/20 0847  BP: 134/74  Pulse: 80  Resp: 18  Temp: (!) 96.9 F (36.1 C)   Filed Weights   06/24/20 0847  Weight: 168 lb 11.2 oz (76.5 kg)    Physical Exam Constitutional:        General: He is not in acute distress. HENT:     Head: Normocephalic and atraumatic.  Eyes:     General: No scleral icterus. Cardiovascular:     Rate and Rhythm: Normal rate and regular rhythm.     Heart sounds: Normal heart sounds.  Pulmonary:     Effort: Pulmonary effort is normal. No respiratory distress.     Breath sounds: No wheezing.  Abdominal:     General: Bowel sounds are normal. There is no distension.     Palpations: Abdomen is soft.  Musculoskeletal:        General: No deformity. Normal range of motion.     Cervical back: Normal range of motion and neck supple.  Skin:    General: Skin is warm and dry.     Findings: No erythema or rash.  Neurological:     Mental Status: He is alert and oriented to person, place, and time. Mental status is at baseline.     Cranial Nerves: No cranial nerve deficit.     Coordination: Coordination normal.  Psychiatric:        Mood and Affect: Mood normal.     LABORATORY DATA:  I have reviewed the data as listed Lab Results  Component Value Date   WBC 4.8 06/18/2020   HGB 11.0 (L) 06/18/2020   HCT 33.0 (L) 06/18/2020   MCV 90.9 06/18/2020   PLT 228 06/18/2020   Recent Labs    05/27/20 0818 06/10/20 0815 06/18/20 0926  NA 139 140 137  K 4.0 4.1 4.0  CL 107 105 104  CO2 $Re'24 25 22  'RiW$ GLUCOSE 155* 148* 134*  BUN 25* 19 16  CREATININE 1.29* 1.12 1.06  CALCIUM 9.1 9.1 8.8*  GFRNONAA 55* >60 >60  GFRAA >60 >60 >60  PROT 6.8 6.5 6.6  ALBUMIN 3.7 3.6 3.5  AST 37 41 36  ALT 46* 76* 76*  ALKPHOS 80 77 84  BILITOT 0.7 0.4 0.5   Iron/TIBC/Ferritin/ %Sat    Component Value Date/Time   IRON 119 04/27/2019 0940   TIBC 363  04/27/2019 0940   FERRITIN 58 04/27/2019 0940   IRONPCTSAT 33 04/27/2019 0940      RADIOGRAPHIC STUDIES: I have personally reviewed the radiological images as listed and agreed with the findings in the report. CT Chest W Contrast  Result Date: 04/23/2020 CLINICAL DATA:  Restaging metastatic  pancreatic cancer. Interval chemotherapy. EXAM: CT CHEST, ABDOMEN, AND PELVIS WITH CONTRAST TECHNIQUE: Multidetector CT imaging of the chest, abdomen and pelvis was performed following the standard protocol during bolus administration of intravenous contrast. CONTRAST:  157mL OMNIPAQUE IOHEXOL 300 MG/ML  SOLN COMPARISON:  01/02/2020 FINDINGS: CT CHEST FINDINGS Cardiovascular: Right Port-A-Cath tip: SVC. Coronary, aortic arch, and branch vessel atherosclerotic vascular disease. Prior CABG. Mediastinum/Nodes: Left infrahilar node 0.9 cm in short axis on image 30/2, previously the same by my measurements. Right paratracheal node 0.8 cm in short axis on image 13/2, previously 0.9 cm by my measurements. Lungs/Pleura: Ill-defined 0.5 cm subpleural nodule in the right upper lobe on image 66/3 likely corresponding to the slightly more solid-appearing 0.5 cm nodule shown on the prior exam. 3 mm subpleural nodule in the right upper lobe on image 67/3, stable. Stable 3 mm right middle lobe subpleural nodule. 0.5 cm right lower lobe subpleural nodule on image 102/3, stable. Other minimal subpleural nodularity noted on the right side, stable. A pleural mass along the left hemidiaphragm measures 2.1 cm in short axis thickness on image 52/4, previously the same. This was previously not hypermetabolic on 27/25/3664. Musculoskeletal: Thoracic spondylosis. Partial fusion of the T3-4 level including the posterior elements, likely congenital. Stable anterior wedge compression at T5. CT ABDOMEN PELVIS FINDINGS Hepatobiliary: Scattered hepatic metastatic lesions are present. Ill-defined margins, but roughly similar size and distribution to the 01/02/2020 exam. A index right hepatic lobe lesion measuring 2.2 cm transversely on image 49/2 is similar in overall size. Most of the lesions have slightly less indistinct borders today compared to previous. Cholecystectomy.  No biliary dilatation noted. Pancreas: Reduced size of the pancreas  potentially from atrophy or resolution of prior inflammation, I favor the former. Mildly prominent dorsal pancreatic duct extending to the somewhat prominent pancreatic head which appears to have accentuated activity compared to the rest of the pancreatic parenchyma. Presumed mass in the pancreatic head region measures proximally 3.6 by 2.8 cm on image 66/3, this was previously larger at about 4.2 by 3.8 cm on 01/02/2020. Admittedly the margins between this lesion and rest of the pancreas are difficult to discern, and I could be including some normal pancreatic tissue in these measurements. Spleen: Unremarkable Adrenals/Urinary Tract: Both adrenal glands appear normal. Bilateral renal cysts are identified. Superficial to a left mid kidney cyst on image 64/2, and more complex lesion measuring 1.5 by 0.8 cm is stable, and is probably a complex cyst based on the pre contrast hyperdensity shown on the PET-CT of 06/04/2019. Other potentially complex left renal cysts are present, and were likewise hyperdense on prior PET-CT. Urinary bladder unremarkable. Stomach/Bowel: Gastric wall thickening particularly along the greater curvature. This was present on prior PET-CT of 06/04/2019 as well, but not hypermetabolic, and may be primarily from nondistention. Vascular/Lymphatic: Aortoiliac atherosclerotic vascular disease. Portacaval node 0.9 cm in short axis on image 59/2, previously 1.0 cm by my measurement. Reproductive: Borderline prostatomegaly. Other: No supplemental non-categorized findings. Musculoskeletal: Posterolateral rod and pedicle screw fixation with interbody spacer at the L4-5 level. No findings of osseous metastatic disease. IMPRESSION: 1. Overall similar appearance of the scattered hepatic metastatic lesions. Mass in the pancreatic head region is moderately reduced  in size compared to the prior exam. 2. Worsening pancreatic atrophy. 3. Stable pleural based mass along the left hemidiaphragm. 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 4. Several tiny subpleural pulmonary nodules are stable. 5. Gastric wall thickening particularly along the greater curvature, probably from nondistention. This was present but not hypermetabolic on prior PET-CT of 06/04/2019. 6. Bilateral renal cysts, of varying complexity on the left. 7. Aortic atherosclerosis. Aortic Atherosclerosis (ICD10-I70.0). Electronically Signed   By: Van Clines M.D.   On: 04/23/2020 15:01   CT Abdomen Pelvis W Contrast  Result Date: 04/23/2020 CLINICAL DATA:  Restaging metastatic pancreatic cancer. Interval chemotherapy. EXAM: CT CHEST, ABDOMEN, AND PELVIS WITH CONTRAST TECHNIQUE: Multidetector CT imaging of the chest, abdomen and pelvis was performed following the standard protocol during bolus administration of intravenous contrast. CONTRAST:  123mL OMNIPAQUE IOHEXOL 300 MG/ML  SOLN COMPARISON:  01/02/2020 FINDINGS: CT CHEST FINDINGS Cardiovascular: Right Port-A-Cath tip: SVC. Coronary, aortic arch, and branch vessel atherosclerotic vascular disease. Prior CABG. Mediastinum/Nodes: Left infrahilar node 0.9 cm in short axis on image 30/2, previously the same by my measurements. Right paratracheal node 0.8 cm in short axis on image 13/2, previously 0.9 cm by my measurements. Lungs/Pleura: Ill-defined 0.5 cm subpleural nodule in the right upper lobe on image 66/3 likely corresponding to the slightly more solid-appearing 0.5 cm nodule shown on the prior exam. 3 mm subpleural nodule in the right upper lobe on image 67/3, stable. Stable 3 mm right middle lobe subpleural nodule. 0.5 cm right lower lobe subpleural nodule on image 102/3, stable. Other minimal subpleural nodularity noted on the right side, stable. A pleural mass along the left hemidiaphragm measures 2.1 cm in short axis thickness on image 52/4, previously the same. This was previously not hypermetabolic on 60/07/9322.  Musculoskeletal: Thoracic spondylosis. Partial fusion of the T3-4 level including the posterior elements, likely congenital. Stable anterior wedge compression at T5. CT ABDOMEN PELVIS FINDINGS Hepatobiliary: Scattered hepatic metastatic lesions are present. Ill-defined margins, but roughly similar size and distribution to the 01/02/2020 exam. A index right hepatic lobe lesion measuring 2.2 cm transversely on image 49/2 is similar in overall size. Most of the lesions have slightly less indistinct borders today compared to previous. Cholecystectomy.  No biliary dilatation noted. Pancreas: Reduced size of the pancreas potentially from atrophy or resolution of prior inflammation, I favor the former. Mildly prominent dorsal pancreatic duct extending to the somewhat prominent pancreatic head which appears to have accentuated activity compared to the rest of the pancreatic parenchyma. Presumed mass in the pancreatic head region measures proximally 3.6 by 2.8 cm on image 66/3, this was previously larger at about 4.2 by 3.8 cm on 01/02/2020. Admittedly the margins between this lesion and rest of the pancreas are difficult to discern, and I could be including some normal pancreatic tissue in these measurements. Spleen: Unremarkable Adrenals/Urinary Tract: Both adrenal glands appear normal. Bilateral renal cysts are identified. Superficial to a left mid kidney cyst on image 64/2, and more complex lesion measuring 1.5 by 0.8 cm is stable, and is probably a complex cyst based on the pre contrast hyperdensity shown on the PET-CT of 06/04/2019. Other potentially complex left renal cysts are present, and were likewise hyperdense on prior PET-CT. Urinary bladder unremarkable. Stomach/Bowel: Gastric wall thickening particularly along the greater curvature. This was present on prior PET-CT of 06/04/2019 as well, but not hypermetabolic, and may be primarily from nondistention. Vascular/Lymphatic: Aortoiliac atherosclerotic vascular  disease.  Portacaval node 0.9 cm in short axis on image 59/2, previously 1.0 cm by my measurement. Reproductive: Borderline prostatomegaly. Other: No supplemental non-categorized findings. Musculoskeletal: Posterolateral rod and pedicle screw fixation with interbody spacer at the L4-5 level. No findings of osseous metastatic disease. IMPRESSION: 1. Overall similar appearance of the scattered hepatic metastatic lesions. Mass in the pancreatic head region is moderately reduced in size compared to the prior exam. 2. Worsening pancreatic atrophy. 3. Stable pleural based mass along the left hemidiaphragm. 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 4. Several tiny subpleural pulmonary nodules are stable. 5. Gastric wall thickening particularly along the greater curvature, probably from nondistention. This was present but not hypermetabolic on prior PET-CT of 06/04/2019. 6. Bilateral renal cysts, of varying complexity on the left. 7. Aortic atherosclerosis. Aortic Atherosclerosis (ICD10-I70.0). Electronically Signed   By: Gaylyn Rong M.D.   On: 04/23/2020 15:01   CT CHEST ABDOMEN PELVIS W CONTRAST  Result Date: 06/04/2020 CLINICAL DATA:  Pancreatic cancer.  On going chemotherapy EXAM: CT CHEST, ABDOMEN, AND PELVIS WITH CONTRAST TECHNIQUE: Multidetector CT imaging of the chest, abdomen and pelvis was performed following the standard protocol during bolus administration of intravenous contrast. CONTRAST:  OMNIPAQUE IOHEXOL 300 MG/ML  SOLN COMPARISON:  CT 04/24/2019 FINDINGS: CT CHEST FINDINGS Cardiovascular: Port in the anterior chest wall with tip in distal SVC. Post CABG Mediastinum/Nodes: No axillary or supraclavicular adenopathy. No mediastinal or hilar adenopathy. No pericardial fluid. Esophagus normal. Lungs/Pleura: Several small subpleural nodules in the RIGHT lower lobe are unchanged. Largest nodule measures 3 mm on image 91/4. Again  demonstrated lesion along the medial LEFT pleural surface adjacent to the descending thoracic aorta measuring 4.2 cm and unchanged prior (image 46/2). Musculoskeletal: No aggressive osseous lesion. CT ABDOMEN AND PELVIS FINDINGS Hepatobiliary: Multiple poorly marginated hypoenhancing lesions are present within the LEFT RIGHT hepatic lobe. Visually these lesions appear increased in size compared to prior. For example lesion the posterior RIGHT hepatic lobe measuring approximately 3.4 cm in diameter (image 47/2) compares to 2.1 cm. Lesion in the anterior aspect RIGHT hepatic lobe measuring 2.7 cm (image 47) compares with 1.9 cm. Small lesion in the caudate lobe measuring 1.1 cm compares to 0.5 cm. Postcholecystectomy Pancreas: Fullness in the head of the pancreas measuring 3.0 x 2.7 cm compares with 3.2 x 2.2 cm for some decrease in size. Atrophy of body and tail the pancreas. Spleen: Normal spleen Adrenals/urinary tract: Adrenal glands and kidneys are normal. The ureters and bladder normal. Stomach/Bowel: Stomach, small bowel, appendix, and cecum are normal. The colon and rectosigmoid colon are normal. Vascular/Lymphatic: Abdominal aorta is normal caliber. There is no retroperitoneal or periportal lymphadenopathy. No pelvic lymphadenopathy. Reproductive: Prostate normal Other: No free fluid. Musculoskeletal: No aggressive osseous lesion. IMPRESSION: 1. Moderate increase in size of multifocal hepatic metastasis. 2. Mass in the head of the pancreas measures slightly smaller. 3. No evidence of new metastatic disease in the abdomen pelvis. 4. Stable small subpleural nodules in the RIGHT lower lobe. 5. Stable pleural mass in the LEFT lower lobe may represent a benign pleural tumor. Electronically Signed   By: Genevive Bi M.D.   On: 06/04/2020 12:16      ASSESSMENT & PLAN:  1. Pancreatic cancer metastasized to liver (HCC)   2. Anemia due to antineoplastic chemotherapy   3. Neuropathy   4. Encounter for  antineoplastic chemotherapy    #Metastatic pancreatic cancer- liver mets, hilar nodal metastasis, ?bone mets,  CA 19-9 nadir at 6679, now  trend up to 20 627.  Today CA 19-9 is pending Patient is on fourth line palliative chemotherapy with FOLFOX. Patient tolerates first cycle very well. Labs reviewed and discussed with patient.  Proceed with cycle 2 FOLFOX today.  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  Continue Cymbalta 60 mg daily.  #Chronic hypokalemia, potassium is 4.4 today. advise patient to stop potassium supplementation. #  diabetes/ hyperglycemia, hyperglycemia is better controlled now on insulin regimen.  Today's glucose level is 149 Continue follow-up with primary care provider  #Normocytic anemia, likely secondary to chemo.  Hemoglobin stable at 10.8.  Continue to monitor.  #All questions were answered. The patient knows to call the clinic with any problems questions or concerns. Follow-up 2 weeks for evaluation prior to next cycle of treatment.   Earlie Server, MD, PhD Hematology Oncology [place] at [facility] 06/24/2020

## 2020-06-24 NOTE — Progress Notes (Signed)
Patient reports an increase in fatigue.

## 2020-06-25 LAB — CANCER ANTIGEN 19-9: CA 19-9: 31781 U/mL — ABNORMAL HIGH (ref 0–35)

## 2020-06-26 ENCOUNTER — Inpatient Hospital Stay: Payer: Medicare Other

## 2020-06-26 ENCOUNTER — Ambulatory Visit: Payer: Self-pay

## 2020-06-26 ENCOUNTER — Other Ambulatory Visit: Payer: Self-pay

## 2020-06-26 DIAGNOSIS — Z5111 Encounter for antineoplastic chemotherapy: Secondary | ICD-10-CM | POA: Diagnosis not present

## 2020-06-26 DIAGNOSIS — E1065 Type 1 diabetes mellitus with hyperglycemia: Secondary | ICD-10-CM

## 2020-06-26 DIAGNOSIS — C259 Malignant neoplasm of pancreas, unspecified: Secondary | ICD-10-CM

## 2020-06-26 MED ORDER — HEPARIN SOD (PORK) LOCK FLUSH 100 UNIT/ML IV SOLN
INTRAVENOUS | Status: AC
  Filled 2020-06-26: qty 5

## 2020-06-26 MED ORDER — SODIUM CHLORIDE 0.9% FLUSH
10.0000 mL | INTRAVENOUS | Status: DC | PRN
  Administered 2020-06-26: 10 mL
  Filled 2020-06-26: qty 10

## 2020-06-26 MED ORDER — HEPARIN SOD (PORK) LOCK FLUSH 100 UNIT/ML IV SOLN
500.0000 [IU] | Freq: Once | INTRAVENOUS | Status: AC | PRN
  Administered 2020-06-26: 500 [IU]
  Filled 2020-06-26: qty 5

## 2020-06-26 NOTE — Chronic Care Management (AMB) (Signed)
Chronic Care Management   Follow Up Note   06/26/2020 Name: Alexsander Cavins MRN: [id number] DOB: 09-24-1946  Primary Care Provider: Trinna Post, PA-C Reason for referral : Chronic Care Management   Yareth Macdonnell is a 74 y.o. year old male who is a primary care patient of Paulene Floor. A telephonic outreach was conducted today. He has met his chronic care management goals.   Review of Mr. Saiki status, including review of consultants reports, relevant labs and test results was conducted today. Collaboration with appropriate care team members was performed as part of the comprehensive evaluation and provision of chronic care management services.     SDOH (Social Determinants of Health) assessments performed: No    Outpatient Encounter Medications as of 06/26/2020  Medication Sig Note  . aspirin EC 81 MG tablet Take 81 mg by mouth daily.   . carvedilol (COREG) 6.25 MG tablet Take 6.25 mg by mouth 2 (two) times daily with a meal.   . Continuous Blood Gluc Receiver (FREESTYLE LIBRE 14 DAY READER) DEVI 1 Device by Does not apply route every 14 (fourteen) days. 02/12/2020: Pending  . diltiazem (CARDIZEM CD) 300 MG 24 hr capsule TAKE 1 CAPSULE BY MOUTH EVERY DAY   . dorzolamide-timolol (COSOPT) 22.3-6.8 MG/ML ophthalmic solution INSTILL ONE DROP INTO BOTH EYES TWICE DAILY   . DULoxetine (CYMBALTA) 30 MG capsule TAKE 2 CAPSULES BY MOUTH EVERY DAY   . ferrous sulfate 325 (65 FE) MG tablet TAKE 1 TABLET BY MOUTH EVERY DAY WITH BREAKFAST   . ibuprofen (ADVIL) 800 MG tablet Take 1 tablet (800 mg total) by mouth every 8 (eight) hours as needed.   . insulin glargine (LANTUS SOLOSTAR) 100 UNIT/ML Solostar Pen Inject 20 Units into the skin at bedtime. (Patient taking differently: Inject 16 Units into the skin at bedtime. )   . insulin lispro (HUMALOG) 100 UNIT/ML KwikPen Inject 0.05 mLs (5 Units total) into the skin 3 (three) times daily with meals.   . Insulin Pen Needle (PEN  NEEDLES) 32G X 5 MM MISC Use four times daily for insulin   . latanoprost (XALATAN) 0.005 % ophthalmic solution Place 1 drop into both eyes at bedtime.    . lidocaine-prilocaine (EMLA) cream APPLY TO AFFECTED AREA ONCE AS DIRECTED   . Multiple Vitamin (MULTIVITAMIN WITH MINERALS) TABS tablet Take 1 tablet by mouth daily.   . OMEGA-3 FATTY ACIDS PO Take 1,200 mg by mouth daily.    [name] Kitchen omeprazole (PRILOSEC) 40 MG capsule TAKE 1 CAPSULE BY MOUTH EVERY DAY   . ONETOUCH VERIO test strip TEST FASTING SUGAR EVERY MORNING   . oxyCODONE (OXY IR/ROXICODONE) 5 MG immediate release tablet Take 1 tablet (5 mg total) by mouth every 6 (six) hours as needed for severe pain. (Patient not taking: Reported on 04/15/2020)   . potassium chloride (KLOR-CON) 10 MEQ tablet TAKE 1 TABLET BY MOUTH EVERY DAY   . rosuvastatin (CRESTOR) 10 MG tablet Take 1 tablet by mouth daily.   [name] Kitchen triamcinolone ointment (KENALOG) 0.5 % APPLY 1 APPLICATION TOPICALLY 3 (THREE) TIMES DAILY.    Facility-Administered Encounter Medications as of 06/26/2020  Medication  . sodium chloride flush (NS) 0.9 % injection 10 mL  . sodium chloride flush (NS) 0.9 % injection 10 mL  . [DISCONTINUED] sodium chloride flush (NS) 0.9 % injection 10 mL     Goals Addressed            This Visit's Progress   . COMPLETED: Chronic Disease Management  CARE PLAN ENTRY (see longitudinal plan of care for additional care plan information)  Current Barriers:  . Chronic Disease Management support and education needs related to Hypertension, Coronary Artery Disease, Diabetes, GERD and pancreatic cancer.  Nurse Case Manager Clinical Goal(s):  [name] Kitchen Over the next 90 days, patient will not experience unplanned hospital admission.-Complete . Over the next 90 days, patient will take all medications as prescribed.-Complete . Over the next 90 days, patient will attend all scheduled medical appointments.-Complete . Over the next 90 days, patient will follow provider  recommendations regarding blood glucose monitoring.-Complete . Over the next 30 days, patient will obtain prescribed Freestyle Libre continuous glucose monitoring device.-Complete . Over the next 90 days, patient will work with care management team and provide updates regarding goals of care and changes in health needs.-Complete   Interventions:  . Inter-disciplinary care team collaboration (see longitudinal plan of care) . Discussed compliance with current treatment plan and potential care management needs. Reports compliance with medications, treatment and diet recommendations. Reports significant improvement with his blood glucose readings. He is currently using a continuous monitoring device and does not recall readings outside of his provider's established parameters. He was evaluated by the Oncology team earlier today. States that he will require follow-up imaging. He reports increased fatigue over the past few weeks but feels that it is likely due to the chemo infusions. Continues to perform ADLs independently. We discussed his treatment plan and current care management needs.  He is pending outreach with the Endocrinology team on 07/03/20 and scheduled for an Oncology follow up on 07/08/20. States his family is still available to assist as needed. He does not anticipate further care management needs but agreed to contact the team if he requires additional assistance.  Patient Self Care Activities:  . Self administers medications . Attends scheduled provider appointments . Calls pharmacy for medication refills . Attends church or other social activities . Performs ADL's independently . Performs IADL's independently . Calls provider office for new concerns or questions   Please see past updates related to this goal by clicking on the "Past Updates" button in the selected goal           PLAN Mr. Defalco has met his care management goals. He agreed to contact the chronic care management  team if additional assistance is needed.    [facility] Care Management [facility] [phone]

## 2020-07-01 NOTE — Patient Instructions (Signed)
Thank you for allowing the Chronic Care Management team to participate in your care.   Goals Addressed            This Visit's Progress   . COMPLETED: Chronic Disease Management       CARE PLAN ENTRY (see longitudinal plan of care for additional care plan information)  Current Barriers:  . Chronic Disease Management support and education needs related to Hypertension, Coronary Artery Disease, Diabetes, GERD and pancreatic cancer.  Nurse Case Manager Clinical Goal(s):  [name] Kitchen Over the next 90 days, patient will not experience unplanned hospital admission.-Complete . Over the next 90 days, patient will take all medications as prescribed.-Complete . Over the next 90 days, patient will attend all scheduled medical appointments.-Complete . Over the next 90 days, patient will follow provider recommendations regarding blood glucose monitoring.-Complete . Over the next 30 days, patient will obtain prescribed Freestyle Libre continuous glucose monitoring device.-Complete . Over the next 90 days, patient will work with care management team and provide updates regarding goals of care and changes in health needs.-Complete   Interventions:  . Inter-disciplinary care team collaboration (see longitudinal plan of care) . Discussed compliance with current treatment plan and potential care management needs. Reports compliance with medications, treatment and diet recommendations. Reports significant improvement with his blood glucose readings. He is currently using a continuous monitoring device and does not recall readings outside of his provider's established parameters. He was evaluated by the Oncology team earlier today. States that he will require follow-up imaging. He reports increased fatigue over the past few weeks but feels that it is likely due to the chemo infusions. Continues to perform ADLs independently. We discussed his treatment plan and current care management needs.  He is pending outreach with  the Endocrinology team on 07/03/20 and scheduled for an Oncology follow up on 07/08/20. States his family is still available to assist as needed. He does not anticipate further care management needs but agreed to contact the team if he requires additional assistance.  Patient Self Care Activities:  . Self administers medications . Attends scheduled provider appointments . Calls pharmacy for medication refills . Attends church or other social activities . Performs ADL's independently . Performs IADL's independently . Calls provider office for new concerns or questions   Please see past updates related to this goal by clicking on the "Past Updates" button in the selected goal         [name] verbalized understanding of the information discussed during the telephonic outreach today. Declined need for a mailed/printed copy of the instructions.   [name] has met his care management goals. He agreed to contact the chronic care management team if additional assistance is needed.    [name] Care Management [facility] [phone]

## 2020-07-02 ENCOUNTER — Telehealth: Payer: Self-pay | Admitting: *Deleted

## 2020-07-02 ENCOUNTER — Other Ambulatory Visit: Payer: Self-pay | Admitting: Oncology

## 2020-07-02 MED ORDER — DOCUSATE SODIUM 100 MG PO CAPS: 100.0000 mg | ORAL_CAPSULE | Freq: Every day | ORAL | 0 refills | Status: DC

## 2020-07-02 NOTE — Telephone Encounter (Signed)
Can you clarify if he has constipation or diarrhea. Thanks.

## 2020-07-02 NOTE — Telephone Encounter (Signed)
I have sent him Colace Rx. Thanks.

## 2020-07-02 NOTE — Telephone Encounter (Signed)
He said a stool softener was to be ordered for constipation

## 2020-07-02 NOTE — Telephone Encounter (Signed)
Patient called reporting that a medicine for his bowels movements was to have been sent in last week, but the pharmacy has not received it

## 2020-07-05 ENCOUNTER — Other Ambulatory Visit: Payer: Self-pay | Admitting: Oncology

## 2020-07-06 ENCOUNTER — Other Ambulatory Visit: Payer: Self-pay | Admitting: Physician Assistant

## 2020-07-06 DIAGNOSIS — I10 Essential (primary) hypertension: Secondary | ICD-10-CM

## 2020-07-06 NOTE — Telephone Encounter (Signed)
Requested Prescriptions  Pending Prescriptions Disp Refills  . diltiazem (CARDIZEM CD) 300 MG 24 hr capsule [Pharmacy Med Name: DILTIAZEM 24H ER(CD) 300 MG CP] 90 capsule 1    Sig: TAKE 1 CAPSULE BY MOUTH EVERY DAY     Cardiovascular:  Calcium Channel Blockers Passed - 07/06/2020 12:50 AM      Passed - Last BP in normal range    BP Readings from Last 1 Encounters:  06/24/20 134/74         Passed - Valid encounter within last 6 months    Recent Outpatient Visits          4 months ago Type 1 diabetes mellitus with hyperglycemia [facility])   [place], [place], PA-C   4 months ago Uncontrolled type 2 diabetes mellitus with hyperglycemia [facility])   [place], [place], PA-C   5 months ago Uncontrolled type 2 diabetes mellitus with hyperglycemia [facility])   [place], [place], PA-C   5 months ago Diabetes mellitus without complication [facility])   [place], PA-C   5 months ago Uncontrolled type 2 diabetes mellitus with hyperglycemia [facility])   [place], [place], [place]

## 2020-07-08 ENCOUNTER — Inpatient Hospital Stay: Payer: Medicare Other

## 2020-07-08 ENCOUNTER — Other Ambulatory Visit: Payer: Self-pay

## 2020-07-08 ENCOUNTER — Encounter: Payer: Self-pay | Admitting: Oncology

## 2020-07-08 ENCOUNTER — Inpatient Hospital Stay (HOSPITAL_BASED_OUTPATIENT_CLINIC_OR_DEPARTMENT_OTHER): Payer: Medicare Other | Admitting: Oncology

## 2020-07-08 VITALS — BP 161/77 | HR 91 | Temp 97.5°F | Resp 18 | Wt 166.2 lb

## 2020-07-08 DIAGNOSIS — C259 Malignant neoplasm of pancreas, unspecified: Secondary | ICD-10-CM

## 2020-07-08 DIAGNOSIS — T451X5A Adverse effect of antineoplastic and immunosuppressive drugs, initial encounter: Secondary | ICD-10-CM

## 2020-07-08 DIAGNOSIS — D6481 Anemia due to antineoplastic chemotherapy: Secondary | ICD-10-CM | POA: Diagnosis not present

## 2020-07-08 DIAGNOSIS — G629 Polyneuropathy, unspecified: Secondary | ICD-10-CM | POA: Diagnosis not present

## 2020-07-08 DIAGNOSIS — Z5111 Encounter for antineoplastic chemotherapy: Secondary | ICD-10-CM

## 2020-07-08 DIAGNOSIS — C787 Secondary malignant neoplasm of liver and intrahepatic bile duct: Secondary | ICD-10-CM

## 2020-07-08 LAB — COMPREHENSIVE METABOLIC PANEL
ALT: 97 U/L — ABNORMAL HIGH (ref 0–44)
AST: 54 U/L — ABNORMAL HIGH (ref 15–41)
Albumin: 3.4 g/dL — ABNORMAL LOW (ref 3.5–5.0)
Alkaline Phosphatase: 89 U/L (ref 38–126)
Anion gap: 9 (ref 5–15)
BUN: 17 mg/dL (ref 8–23)
CO2: 23 mmol/L (ref 22–32)
Calcium: 8.8 mg/dL — ABNORMAL LOW (ref 8.9–10.3)
Chloride: 107 mmol/L (ref 98–111)
Creatinine, Ser: 1.05 mg/dL (ref 0.61–1.24)
GFR calc Af Amer: >60 mL/min (ref >60)
GFR calc non Af Amer: >60 mL/min (ref >60)
Glucose, Bld: 161 mg/dL — ABNORMAL HIGH (ref 70–99)
Potassium: 3.8 mmol/L (ref 3.5–5.1)
Sodium: 139 mmol/L (ref 135–145)
Total Bilirubin: 0.6 mg/dL (ref 0.3–1.2)
Total Protein: 6.5 g/dL (ref 6.5–8.1)

## 2020-07-08 LAB — CBC WITH DIFFERENTIAL/PLATELET
Abs Immature Granulocytes: 0.01 10*3/uL (ref 0.00–0.07)
Basophils Absolute: 0 10*3/uL (ref 0.0–0.1)
Basophils Relative: 1 %
Eosinophils Absolute: 0.1 10*3/uL (ref 0.0–0.5)
Eosinophils Relative: 3 %
HCT: 31.1 % — ABNORMAL LOW (ref 39.0–52.0)
Hemoglobin: 10.1 g/dL — ABNORMAL LOW (ref 13.0–17.0)
Immature Granulocytes: 0 %
Lymphocytes Relative: 14 %
Lymphs Abs: 0.6 10*3/uL — ABNORMAL LOW (ref 0.7–4.0)
MCH: 29.8 pg (ref 26.0–34.0)
MCHC: 32.5 g/dL (ref 30.0–36.0)
MCV: 91.7 fL (ref 80.0–100.0)
Monocytes Absolute: 0.7 10*3/uL (ref 0.1–1.0)
Monocytes Relative: 15 %
Neutro Abs: 3 10*3/uL (ref 1.7–7.7)
Neutrophils Relative %: 67 %
Platelets: 168 10*3/uL (ref 150–400)
RBC: 3.39 MIL/uL — ABNORMAL LOW (ref 4.22–5.81)
RDW: 15.9 % — ABNORMAL HIGH (ref 11.5–15.5)
WBC: 4.5 10*3/uL (ref 4.0–10.5)
nRBC: 0 % (ref 0.0–0.2)

## 2020-07-08 MED ORDER — SODIUM CHLORIDE 0.9 % IV SOLN
10.0000 mg | Freq: Once | INTRAVENOUS | Status: AC
  Administered 2020-07-08: 10 mg via INTRAVENOUS
  Filled 2020-07-08: qty 10

## 2020-07-08 MED ORDER — SODIUM CHLORIDE 0.9% FLUSH
10.0000 mL | INTRAVENOUS | Status: DC | PRN
  Administered 2020-07-08: 10 mL
  Filled 2020-07-08: qty 10

## 2020-07-08 MED ORDER — DEXTROSE 5 % IV SOLN
Freq: Once | INTRAVENOUS | Status: AC
  Filled 2020-07-08: qty 250

## 2020-07-08 MED ORDER — LEUCOVORIN CALCIUM INJECTION 350 MG
800.0000 mg | Freq: Once | INTRAVENOUS | Status: AC
  Administered 2020-07-08: 800 mg via INTRAVENOUS
  Filled 2020-07-08: qty 25

## 2020-07-08 MED ORDER — SODIUM CHLORIDE 0.9% FLUSH
10.0000 mL | Freq: Once | INTRAVENOUS | Status: AC
  Administered 2020-07-08: 10 mL via INTRAVENOUS
  Filled 2020-07-08: qty 10

## 2020-07-08 MED ORDER — OXALIPLATIN CHEMO INJECTION 100 MG/20ML
85.0000 mg/m2 | Freq: Once | INTRAVENOUS | Status: AC
  Administered 2020-07-08: 165 mg via INTRAVENOUS
  Filled 2020-07-08: qty 33

## 2020-07-08 MED ORDER — PALONOSETRON HCL INJECTION 0.25 MG/5ML
0.2500 mg | Freq: Once | INTRAVENOUS | Status: AC
  Administered 2020-07-08: 0.25 mg via INTRAVENOUS
  Filled 2020-07-08: qty 5

## 2020-07-08 MED ORDER — SODIUM CHLORIDE 0.9 % IV SOLN
2400.0000 mg/m2 | INTRAVENOUS | Status: DC
  Administered 2020-07-08: 4700 mg via INTRAVENOUS
  Filled 2020-07-08: qty 94

## 2020-07-08 NOTE — Progress Notes (Signed)
Patient reports the stool softener has helped with constipation but he is having new abdominal pain since taking the med.

## 2020-07-08 NOTE — Progress Notes (Signed)
ALT: 49. MD, Dr. Tasia Catchings, notified and aware. Per MD order: proceed with scheduled Oxaliplatin, Leucovorin, and Fluorouracil treatment today.

## 2020-07-08 NOTE — Progress Notes (Signed)
Hematology/Oncology  Follow up note [facility] [phone] [phone]   Patient Care Team: Paulene Floor as PCP - General (Physician Assistant) Clent Jacks, RN as Oncology Nurse Navigator Earlie Server, MD as Consulting Physician (Hematology and Oncology) Neldon Labella, RN as Case Manager Lonia Farber, MD as Consulting Physician (Internal Medicine)  REFERRING PROVIDER: Trinna Post, PA-C  CHIEF COMPLAINTS/REASON FOR VISIT:  Follow up for treatment of pancreatic cancer  HISTORY OF PRESENTING ILLNESS:   Craig [name] is a  74 y.o.  male with PMH listed below, including CABG x5, PVD, hypertension, former tobacco abuse, former alcohol abuse, iron deficiency anemia, was seen in consultation at the request of  Terrilee Croak, Adriana M, PA-C  for evaluation of abnormal CT Patient recently presented to primary care provider complaining for abdominal pain radiating to his back for about 10 days.  Patient uses Tylenol as needed for pain. Work-up showed elevated amylase, lipase, mild anemia with hemoglobin of 11.3 Acute hepatitis panel negative. 05/16/2019 CT abdomen pelvis with contrast showed poorly marginated heterogeneous hypodense 2.9 cm pancreatic mass at the uncinate process, worrisome for primary pancreatic cancer.  No biliary or pancreatic duct dilation. Several ill defined hypodense liver masses scattered throughout the liver, largest 3.1 cm in the segment 6 right liver lobe. Nonspecific portacaval and aortocaval adenopathy. Extreme medial left lung base 4.4 cm pleural-based mass probably a pleural metastasis.  Additional tiny pulmonary nodules scattered at the right lung base are indeterminate. Mild prostate or megaly  He had a history of 37.5-pack-year smoking history quitting 2018. No longer drinking alcohol.  # ultrasound-guided liver biopsy on 05/21/2019.  Pathology showed fragments of benign hepatic parenchyma showing  minimal macro vascular steatosis, otherwise no significant histo pathologic change. Single core of renal tissue showing approximately 25% glomerulosclerosis  # #History of CAD, status post CABG x5.  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  # Omniseq test showed PD-L1 50% TPS, TMB high, negative for BRCA1/2 or NTRK fusion.  MSI could not be completed.  Patient declines genetic testing # 06/07/2019 -1st line chemotherapy Gemcitabine and abraxane. 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 # 11/14/2019- progression as well as profound weekend fatigue, 2nd line treatment with keytruda # 01/07/2020 -progression, start third line chemotherapy treatment 5-FU with liposomal irinotecan 01/24/2020-01/26/2020 patient was admitted to ICU due to DKA. on long-acting insulin 20 units at night and takes sliding scale with meals.  INTERVAL HISTORY [name] is a 74 y.o. male who has above history reviewed by me today presents for evaluation of chemotherapy tolerability for metastatic pancreatic cancer.   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  No numbness or tingling in his finger tips. Patient tolerates 2 cycles of FOLFOX.  Appetite is fair.  He has lost 2 pounds since last visit. He reports occasional abdominal pain which is relieved after taking stool softener.  No constant pain currently.  No nausea vomiting diarrhea. .. Review of Systems  Constitutional: Positive for fatigue and unexpected weight change. Negative for appetite change, chills and fever.  HENT:   Negative for hearing loss and voice change.   Eyes: Negative for eye problems and icterus.  Respiratory: Negative for chest tightness, cough and shortness of breath.   Cardiovascular: Negative for chest pain and leg swelling.   Gastrointestinal: Negative  for abdominal distention and abdominal pain.  Endocrine: Negative for hot flashes.  Genitourinary: Negative for difficulty urinating, dysuria and frequency.   Musculoskeletal: Negative for arthralgias.  Skin: Negative for itching and rash.  Neurological: Positive for numbness. Negative for light-headedness.  Hematological: Negative for adenopathy. Does not bruise/bleed easily.  Psychiatric/Behavioral: Negative for confusion.    MEDICAL HISTORY:  Past Medical History:  Diagnosis Date  . Allergic rhinitis   . CHF (congestive heart failure) ([place])   . Chronic cholecystitis   . Coronary artery disease   . DDD (degenerative disc disease), cervical   . Dehydration 06/21/2019  . Diabetes mellitus without complication ([place])   . Dyspnea   . Early cataract   . Erectile dysfunction   . GERD (gastroesophageal reflux disease)   . Gouty arthritis   . Headache    occasional migraines  . Hypercalcemia   . Hyperlipemia   . Hypertension   . Palpitations   . Pancreatic cancer metastasized to liver ([name]) 05/2019   Chemo tx's  . Peripheral vascular disease ([place])   . 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   LIMA to LAD SVG to [place] SVG SEQUENTIALLY to OM1 and OM2 SVG to ACUTE MARGINAL  . Spinal stenosis of cervical region   . Vitamin D deficiency     SURGICAL HISTORY: Past Surgical History:  Procedure Laterality Date  . BACK SURGERY  2018    fusion with screws  . CHOLECYSTECTOMY N/A 11/13/2018   Procedure: LAPAROSCOPIC CHOLECYSTECTOMY;  Surgeon: Vickie Epley, MD;  Location: [facility];  Service: General;  Laterality: N/A;  . COLONOSCOPY    . CORONARY ARTERY BYPASS GRAFT N/A 09/30/2017   Procedure: CORONARY ARTERY BYPASS GRAFTING times five using right and left Saphaneous vein harvested endoscopicly  and left internal mammary artery. (CABG),TEE;  Surgeon: Rexene Alberts, MD;  Location: [place];  Service: Open Heart Surgery;  Laterality: N/A;  . LEFT HEART CATH AND  CORONARY ANGIOGRAPHY Left 09/06/2017   Procedure: LEFT HEART CATH AND CORONARY ANGIOGRAPHY;  Surgeon: Corey Skains, MD;  Location: [place] [facility];  Service: Cardiovascular;  Laterality: Left;  . percutaneous transluminal balloon angioplasty  01/2010   of left lower extremity  . PORTA CATH INSERTION N/A 06/06/2019   Procedure: PORTA CATH INSERTION;  Surgeon: Katha Cabal, MD;  Location: [place] [facility];  Service: Cardiovascular;  Laterality: N/A;  . TEE WITHOUT CARDIOVERSION N/A 09/30/2017   Procedure: TRANSESOPHAGEAL ECHOCARDIOGRAM (TEE);  Surgeon: Rexene Alberts, MD;  Location: [place];  Service: Open Heart Surgery;  Laterality: N/A;  . VASCULAR SURGERY Left 2010   left exernal iliac and superficial femoral artery PTCA and stenting    SOCIAL HISTORY: Social History   Socioeconomic History  . Marital status: Married    Spouse name: Enid Derry  . Number of children: 2  . Years of education: Not on file  . Highest education level: 5th grade  Occupational History  . Occupation: drove tractors    Comment: retired  Tobacco Use  . Smoking status: Former Smoker    Packs/day: 0.75    Years: 50.00    Pack years: 37.50    Types: Cigarettes    Quit date: 09/27/2017    Years since quitting: 2.7  . Smokeless tobacco: Former Systems developer    Types: Secondary school teacher  . Vaping Use: Never used  Substance and Sexual Activity  . Alcohol use: Not Currently    Comment: stopped drinking before cabg  . Drug  use: Not Currently    Types: Marijuana    Comment: stopped smoking before CABG  . Sexual activity: Not Currently  Other Topics Concern  . Not on file  Social History Narrative  . Not on file   Social Determinants of Health   Financial Resource Strain: Low Risk   . Difficulty of Paying Living Expenses: Not hard at all  Food Insecurity: No Food Insecurity  . Worried About Charity fundraiser in the Last Year: Never true  . Ran Out of Food in the Last Year: Never true    Transportation Needs: No Transportation Needs  . Lack of Transportation (Medical): No  . Lack of Transportation (Non-Medical): No  Physical Activity: Inactive  . Days of Exercise per Week: 0 days  . Minutes of Exercise per Session: 0 min  Stress: No Stress Concern Present  . Feeling of Stress : Not at all  Social Connections: Moderately Isolated  . Frequency of Communication with Friends and Family: More than three times a week  . Frequency of Social Gatherings with Friends and Family: More than three times a week  . Attends Religious Services: Never  . Active Member of Clubs or Organizations: No  . Attends Archivist Meetings: Never  . Marital Status: Married  Human resources officer Violence: Not At Risk  . Fear of Current or Ex-Partner: No  . Emotionally Abused: No  . Physically Abused: No  . Sexually Abused: No    FAMILY HISTORY: Family History  Problem Relation Age of Onset  . Brain cancer Mother   . Emphysema Father   . Cancer Brother     ALLERGIES:  is allergic to lipitor [atorvastatin], zetia [ezetimibe], lisinopril, protonix [pantoprazole], and spironolactone.  MEDICATIONS:  Current Outpatient Medications  Medication Sig Dispense Refill  . aspirin EC 81 MG tablet Take 81 mg by mouth daily.    . carvedilol (COREG) 6.25 MG tablet Take 6.25 mg by mouth 2 (two) times daily with a meal.  3  . Continuous Blood Gluc Receiver (FREESTYLE LIBRE 14 DAY READER) DEVI 1 Device by Does not apply route every 14 (fourteen) days. 6 each 1  . diltiazem (CARDIZEM CD) 300 MG 24 hr capsule TAKE 1 CAPSULE BY MOUTH EVERY DAY 90 capsule 0  . docusate sodium (COLACE) 100 MG capsule Take 1 capsule (100 mg total) by mouth daily. 30 capsule 0  . dorzolamide-timolol (COSOPT) 22.3-6.8 MG/ML ophthalmic solution INSTILL ONE DROP INTO BOTH EYES TWICE DAILY    . DULoxetine (CYMBALTA) 30 MG capsule TAKE 2 CAPSULES BY MOUTH EVERY DAY 180 capsule 1  . ferrous sulfate 325 (65 FE) MG tablet TAKE 1  TABLET BY MOUTH EVERY DAY WITH BREAKFAST 90 tablet 1  . ibuprofen (ADVIL) 800 MG tablet Take 1 tablet (800 mg total) by mouth every 8 (eight) hours as needed. 30 tablet 0  . insulin glargine (LANTUS SOLOSTAR) 100 UNIT/ML Solostar Pen Inject 20 Units into the skin at bedtime. (Patient taking differently: Inject 16 Units into the skin at bedtime. Taking 10U) 15 mL 0  . insulin lispro (HUMALOG) 100 UNIT/ML KwikPen Inject 0.05 mLs (5 Units total) into the skin 3 (three) times daily with meals. 15 mL 11  . Insulin Pen Needle (PEN NEEDLES) 32G X 5 MM MISC Use four times daily for insulin 400 each 1  . latanoprost (XALATAN) 0.005 % ophthalmic solution Place 1 drop into both eyes at bedtime.   3  . lidocaine-prilocaine (EMLA) cream APPLY TO AFFECTED AREA ONCE  AS DIRECTED    . omeprazole (PRILOSEC) 40 MG capsule TAKE 1 CAPSULE BY MOUTH EVERY DAY 90 capsule 1  . ONETOUCH VERIO test strip TEST FASTING SUGAR EVERY MORNING 100 strip 3  . potassium chloride (KLOR-CON) 10 MEQ tablet TAKE 1 TABLET BY MOUTH EVERY DAY 30 tablet 2  . rosuvastatin (CRESTOR) 10 MG tablet Take 1 tablet by mouth daily.    [name] Kitchen triamcinolone ointment (KENALOG) 0.5 % APPLY 1 APPLICATION TOPICALLY 3 (THREE) TIMES DAILY. 30 g 0  . Multiple Vitamin (MULTIVITAMIN WITH MINERALS) TABS tablet Take 1 tablet by mouth daily. (Patient not taking: Reported on 07/08/2020)    . OMEGA-3 FATTY ACIDS PO Take 1,200 mg by mouth daily.  (Patient not taking: Reported on 07/08/2020)    . oxyCODONE (OXY IR/ROXICODONE) 5 MG immediate release tablet Take 1 tablet (5 mg total) by mouth every 6 (six) hours as needed for severe pain. (Patient not taking: Reported on 04/15/2020) 30 tablet 0   No current facility-administered medications for this visit.   Facility-Administered Medications Ordered in Other Visits  Medication Dose Route Frequency Provider Last Rate Last Admin  . fluorouracil (ADRUCIL) 4,700 mg in sodium chloride 0.9 % 56 mL chemo infusion  2,400 mg/m2  (Treatment Plan Recorded) Intravenous 1 day or 1 dose Earlie Server, MD      . leucovorin 800 mg in dextrose 5 % 250 mL infusion  800 mg Intravenous Once Earlie Server, MD 145 mL/hr at 07/08/20 1111 800 mg at 07/08/20 1111  . oxaliplatin (ELOXATIN) 165 mg in dextrose 5 % 500 mL chemo infusion  85 mg/m2 (Treatment Plan Recorded) Intravenous Once Earlie Server, MD 267 mL/hr at 07/08/20 1110 165 mg at 07/08/20 1110  . sodium chloride flush (NS) 0.9 % injection 10 mL  10 mL Intracatheter PRN Earlie Server, MD      . sodium chloride flush (NS) 0.9 % injection 10 mL  10 mL Intravenous Once Earlie Server, MD      . sodium chloride flush (NS) 0.9 % injection 10 mL  10 mL Intracatheter PRN Earlie Server, MD         PHYSICAL EXAMINATION: ECOG PERFORMANCE STATUS: 1 - Symptomatic but completely ambulatory Vitals:   07/08/20 0906  BP: (!) 161/77  Pulse: 91  Resp: 18  Temp: (!) 97.5 F (36.4 C)   Filed Weights   07/08/20 0906  Weight: 166 lb 3.2 oz (75.4 kg)    Physical Exam Constitutional:      General: He is not in acute distress. HENT:     Head: Normocephalic and atraumatic.  Eyes:     General: No scleral icterus. Cardiovascular:     Rate and Rhythm: Normal rate and regular rhythm.     Heart sounds: Normal heart sounds.  Pulmonary:     Effort: Pulmonary effort is normal. No respiratory distress.     Breath sounds: No wheezing.  Abdominal:     General: Bowel sounds are normal. There is no distension.     Palpations: Abdomen is soft.  Musculoskeletal:        General: No deformity. Normal range of motion.     Cervical back: Normal range of motion and neck supple.  Skin:    General: Skin is warm and dry.     Findings: No erythema or rash.  Neurological:     Mental Status: He is alert and oriented to person, place, and time. Mental status is at baseline.     Cranial Nerves: No cranial nerve  deficit.     Coordination: Coordination normal.  Psychiatric:        Mood and Affect: Mood normal.     LABORATORY  DATA:  I have reviewed the data as listed Lab Results  Component Value Date   WBC 4.5 07/08/2020   HGB 10.1 (L) 07/08/2020   HCT 31.1 (L) 07/08/2020   MCV 91.7 07/08/2020   PLT 168 07/08/2020   Recent Labs    06/18/20 0926 06/24/20 0826 07/08/20 0841  NA 137 139 139  K 4.0 4.4 3.8  CL 104 103 107  CO2 $Re'22 25 23  'fCV$ GLUCOSE 134* 149* 161*  BUN $Re'16 21 17  'VWu$ CREATININE 1.06 0.98 1.05  CALCIUM 8.8* 9.2 8.8*  GFRNONAA >60 >60 >60  GFRAA >60 >60 >60  PROT 6.6 6.9 6.5  ALBUMIN 3.5 3.6 3.4*  AST 36 48* 54*  ALT 76* 90* 97*  ALKPHOS 84 83 89  BILITOT 0.5 0.6 0.6   Iron/TIBC/Ferritin/ %Sat    Component Value Date/Time   IRON 119 04/27/2019 0940   TIBC 363 04/27/2019 0940   FERRITIN 58 04/27/2019 0940   IRONPCTSAT 33 04/27/2019 0940      RADIOGRAPHIC STUDIES: I have personally reviewed the radiological images as listed and agreed with the findings in the report. CT Chest W Contrast  Result Date: 04/23/2020 CLINICAL DATA:  Restaging metastatic pancreatic cancer. Interval chemotherapy. EXAM: CT CHEST, ABDOMEN, AND PELVIS WITH CONTRAST TECHNIQUE: Multidetector CT imaging of the chest, abdomen and pelvis was performed following the standard protocol during bolus administration of intravenous contrast. CONTRAST:  142mL OMNIPAQUE IOHEXOL 300 MG/ML  SOLN COMPARISON:  01/02/2020 FINDINGS: CT CHEST FINDINGS Cardiovascular: Right Port-A-Cath tip: SVC. Coronary, aortic arch, and branch vessel atherosclerotic vascular disease. Prior CABG. Mediastinum/Nodes: Left infrahilar node 0.9 cm in short axis on image 30/2, previously the same by my measurements. Right paratracheal node 0.8 cm in short axis on image 13/2, previously 0.9 cm by my measurements. Lungs/Pleura: Ill-defined 0.5 cm subpleural nodule in the right upper lobe on image 66/3 likely corresponding to the slightly more solid-appearing 0.5 cm nodule shown on the prior exam. 3 mm subpleural nodule in the right upper lobe on image 67/3, stable.  Stable 3 mm right middle lobe subpleural nodule. 0.5 cm right lower lobe subpleural nodule on image 102/3, stable. Other minimal subpleural nodularity noted on the right side, stable. A pleural mass along the left hemidiaphragm measures 2.1 cm in short axis thickness on image 52/4, previously the same. This was previously not hypermetabolic on 13/24/4010. Musculoskeletal: Thoracic spondylosis. Partial fusion of the T3-4 level including the posterior elements, likely congenital. Stable anterior wedge compression at T5. CT ABDOMEN PELVIS FINDINGS Hepatobiliary: Scattered hepatic metastatic lesions are present. Ill-defined margins, but roughly similar size and distribution to the 01/02/2020 exam. A index right hepatic lobe lesion measuring 2.2 cm transversely on image 49/2 is similar in overall size. Most of the lesions have slightly less indistinct borders today compared to previous. Cholecystectomy.  No biliary dilatation noted. Pancreas: Reduced size of the pancreas potentially from atrophy or resolution of prior inflammation, I favor the former. Mildly prominent dorsal pancreatic duct extending to the somewhat prominent pancreatic head which appears to have accentuated activity compared to the rest of the pancreatic parenchyma. Presumed mass in the pancreatic head region measures proximally 3.6 by 2.8 cm on image 66/3, this was previously larger at about 4.2 by 3.8 cm on 01/02/2020. Admittedly the margins between this lesion and rest of the pancreas are difficult  to discern, and I could be including some normal pancreatic tissue in these measurements. Spleen: Unremarkable Adrenals/Urinary Tract: Both adrenal glands appear normal. Bilateral renal cysts are identified. Superficial to a left mid kidney cyst on image 64/2, and more complex lesion measuring 1.5 by 0.8 cm is stable, and is probably a complex cyst based on the pre contrast hyperdensity shown on the PET-CT of 06/04/2019. Other potentially complex left  renal cysts are present, and were likewise hyperdense on prior PET-CT. Urinary bladder unremarkable. Stomach/Bowel: Gastric wall thickening particularly along the greater curvature. This was present on prior PET-CT of 06/04/2019 as well, but not hypermetabolic, and may be primarily from nondistention. Vascular/Lymphatic: Aortoiliac atherosclerotic vascular disease. Portacaval node 0.9 cm in short axis on image 59/2, previously 1.0 cm by my measurement. Reproductive: Borderline prostatomegaly. Other: No supplemental non-categorized findings. Musculoskeletal: Posterolateral rod and pedicle screw fixation with interbody spacer at the L4-5 level. No findings of osseous metastatic disease. IMPRESSION: 1. Overall similar appearance of the scattered hepatic metastatic lesions. Mass in the pancreatic head region is moderately reduced in size compared to the prior exam. 2. Worsening pancreatic atrophy. 3. Stable pleural based mass along the left hemidiaphragm. 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 4. Several tiny subpleural pulmonary nodules are stable. 5. Gastric wall thickening particularly along the greater curvature, probably from nondistention. This was present but not hypermetabolic on prior PET-CT of 06/04/2019. 6. Bilateral renal cysts, of varying complexity on the left. 7. Aortic atherosclerosis. Aortic Atherosclerosis (ICD10-I70.0). Electronically Signed   By: Van Clines M.D.   On: 04/23/2020 15:01   CT Abdomen Pelvis W Contrast  Result Date: 04/23/2020 CLINICAL DATA:  Restaging metastatic pancreatic cancer. Interval chemotherapy. EXAM: CT CHEST, ABDOMEN, AND PELVIS WITH CONTRAST TECHNIQUE: Multidetector CT imaging of the chest, abdomen and pelvis was performed following the standard protocol during bolus administration of intravenous contrast. CONTRAST:  133mL OMNIPAQUE IOHEXOL 300 MG/ML  SOLN COMPARISON:  01/02/2020 FINDINGS: CT CHEST  FINDINGS Cardiovascular: Right Port-A-Cath tip: SVC. Coronary, aortic arch, and branch vessel atherosclerotic vascular disease. Prior CABG. Mediastinum/Nodes: Left infrahilar node 0.9 cm in short axis on image 30/2, previously the same by my measurements. Right paratracheal node 0.8 cm in short axis on image 13/2, previously 0.9 cm by my measurements. Lungs/Pleura: Ill-defined 0.5 cm subpleural nodule in the right upper lobe on image 66/3 likely corresponding to the slightly more solid-appearing 0.5 cm nodule shown on the prior exam. 3 mm subpleural nodule in the right upper lobe on image 67/3, stable. Stable 3 mm right middle lobe subpleural nodule. 0.5 cm right lower lobe subpleural nodule on image 102/3, stable. Other minimal subpleural nodularity noted on the right side, stable. A pleural mass along the left hemidiaphragm measures 2.1 cm in short axis thickness on image 52/4, previously the same. This was previously not hypermetabolic on 32/44/0102. Musculoskeletal: Thoracic spondylosis. Partial fusion of the T3-4 level including the posterior elements, likely congenital. Stable anterior wedge compression at T5. CT ABDOMEN PELVIS FINDINGS Hepatobiliary: Scattered hepatic metastatic lesions are present. Ill-defined margins, but roughly similar size and distribution to the 01/02/2020 exam. A index right hepatic lobe lesion measuring 2.2 cm transversely on image 49/2 is similar in overall size. Most of the lesions have slightly less indistinct borders today compared to previous. Cholecystectomy.  No biliary dilatation noted. Pancreas: Reduced size of the pancreas potentially from atrophy or resolution of prior inflammation, I favor the former. Mildly prominent dorsal pancreatic  duct extending to the somewhat prominent pancreatic head which appears to have accentuated activity compared to the rest of the pancreatic parenchyma. Presumed mass in the pancreatic head region measures proximally 3.6 by 2.8 cm on image  66/3, this was previously larger at about 4.2 by 3.8 cm on 01/02/2020. Admittedly the margins between this lesion and rest of the pancreas are difficult to discern, and I could be including some normal pancreatic tissue in these measurements. Spleen: Unremarkable Adrenals/Urinary Tract: Both adrenal glands appear normal. Bilateral renal cysts are identified. Superficial to a left mid kidney cyst on image 64/2, and more complex lesion measuring 1.5 by 0.8 cm is stable, and is probably a complex cyst based on the pre contrast hyperdensity shown on the PET-CT of 06/04/2019. Other potentially complex left renal cysts are present, and were likewise hyperdense on prior PET-CT. Urinary bladder unremarkable. Stomach/Bowel: Gastric wall thickening particularly along the greater curvature. This was present on prior PET-CT of 06/04/2019 as well, but not hypermetabolic, and may be primarily from nondistention. Vascular/Lymphatic: Aortoiliac atherosclerotic vascular disease. Portacaval node 0.9 cm in short axis on image 59/2, previously 1.0 cm by my measurement. Reproductive: Borderline prostatomegaly. Other: No supplemental non-categorized findings. Musculoskeletal: Posterolateral rod and pedicle screw fixation with interbody spacer at the L4-5 level. No findings of osseous metastatic disease. IMPRESSION: 1. Overall similar appearance of the scattered hepatic metastatic lesions. Mass in the pancreatic head region is moderately reduced in size compared to the prior exam. 2. Worsening pancreatic atrophy. 3. Stable pleural based mass along the left hemidiaphragm. 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 4. Several tiny subpleural pulmonary nodules are stable. 5. Gastric wall thickening particularly along the greater curvature, probably from nondistention. This was present but not hypermetabolic on prior PET-CT of 06/04/2019. 6. Bilateral renal cysts, of varying  complexity on the left. 7. Aortic atherosclerosis. Aortic Atherosclerosis (ICD10-I70.0). Electronically Signed   By: Van Clines M.D.   On: 04/23/2020 15:01   CT CHEST ABDOMEN PELVIS W CONTRAST  Result Date: 06/04/2020 CLINICAL DATA:  Pancreatic cancer.  On going chemotherapy EXAM: CT CHEST, ABDOMEN, AND PELVIS WITH CONTRAST TECHNIQUE: Multidetector CT imaging of the chest, abdomen and pelvis was performed following the standard protocol during bolus administration of intravenous contrast. CONTRAST:  150mL OMNIPAQUE IOHEXOL 300 MG/ML  SOLN COMPARISON:  CT 04/24/2019 FINDINGS: CT CHEST FINDINGS Cardiovascular: Port in the anterior chest wall with tip in distal SVC. Post CABG Mediastinum/Nodes: No axillary or supraclavicular adenopathy. No mediastinal or hilar adenopathy. No pericardial fluid. Esophagus normal. Lungs/Pleura: Several small subpleural nodules in the RIGHT lower lobe are unchanged. Largest nodule measures 3 mm on image 91/4. Again demonstrated lesion along the medial LEFT pleural surface adjacent to the descending thoracic aorta measuring 4.2 cm and unchanged prior (image 46/2). Musculoskeletal: No aggressive osseous lesion. CT ABDOMEN AND PELVIS FINDINGS Hepatobiliary: Multiple poorly marginated hypoenhancing lesions are present within the LEFT RIGHT hepatic lobe. Visually these lesions appear increased in size compared to prior. For example lesion the posterior RIGHT hepatic lobe measuring approximately 3.4 cm in diameter (image 47/2) compares to 2.1 cm. Lesion in the anterior aspect RIGHT hepatic lobe measuring 2.7 cm (image 47) compares with 1.9 cm. Small lesion in the caudate lobe measuring 1.1 cm compares to 0.5 cm. Postcholecystectomy Pancreas: Fullness in the head of the pancreas measuring 3.0 x 2.7 cm compares with 3.2 x 2.2 cm for some decrease in size. Atrophy of body and tail  the pancreas. Spleen: Normal spleen Adrenals/urinary tract: Adrenal glands and kidneys are normal. The  ureters and bladder normal. Stomach/Bowel: Stomach, small bowel, appendix, and cecum are normal. The colon and rectosigmoid colon are normal. Vascular/Lymphatic: Abdominal aorta is normal caliber. There is no retroperitoneal or periportal lymphadenopathy. No pelvic lymphadenopathy. Reproductive: Prostate normal Other: No free fluid. Musculoskeletal: No aggressive osseous lesion. IMPRESSION: 1. Moderate increase in size of multifocal hepatic metastasis. 2. Mass in the head of the pancreas measures slightly smaller. 3. No evidence of new metastatic disease in the abdomen pelvis. 4. Stable small subpleural nodules in the RIGHT lower lobe. 5. Stable pleural mass in the LEFT lower lobe may represent a benign pleural tumor. Electronically Signed   By: Suzy Bouchard M.D.   On: 06/04/2020 12:16      ASSESSMENT & PLAN:  1. Pancreatic cancer metastasized to liver ([place])   2. Anemia due to antineoplastic chemotherapy   3. Encounter for antineoplastic chemotherapy   4. Neuropathy    #Metastatic pancreatic cancer- liver mets, hilar nodal metastasis, ?bone mets, CA 19-9 nadir at 6679, now  trend up to 20 627.  Today CA 19-9 is pending Patient is on fourth line palliative chemotherapy with FOLFOX. He tolerates 2 cycles. Labs are reviewed and discussed with patient.  Proceed with cycle 3 FOLFOX today CA 19-9 continues to rise.  Today's tumor marker still pending. Consider short-term follow-up with CT scan. We also discussed about the fact that he has seen most of the options for pancreatic cancer and if this line of treatment is not working, there is limited treatment options left for him.  If he is to is in good condition, we may consider to recycle some of the previous chemotherapy regimens.  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  #Chronic hypokalemia, potassium is 3.8. #  diabetes/ hyperglycemia, hyperglycemia is better controlled now on  insulin regimen.  Today's glucose level is 149 Continue follow-up with primary care provider  #Normocytic anemia, likely secondary to chemo.  Hemoglobin is 10.1, stable.  Continue to monitor.  #All questions were answered. The patient knows to call the clinic with any problems questions or concerns. Follow-up 2 weeks for evaluation prior to next cycle of treatment.   Earlie Server, MD, PhD Hematology Oncology [place] [facility] 07/08/2020

## 2020-07-09 LAB — CANCER ANTIGEN 19-9: CA 19-9: 30699 U/mL — ABNORMAL HIGH (ref 0–35)

## 2020-07-10 ENCOUNTER — Other Ambulatory Visit: Payer: Self-pay

## 2020-07-10 ENCOUNTER — Inpatient Hospital Stay: Payer: Medicare Other

## 2020-07-10 ENCOUNTER — Telehealth: Payer: Self-pay

## 2020-07-10 VITALS — BP 135/64 | HR 78 | Temp 97.8°F | Resp 20

## 2020-07-10 DIAGNOSIS — Z5111 Encounter for antineoplastic chemotherapy: Secondary | ICD-10-CM | POA: Diagnosis not present

## 2020-07-10 DIAGNOSIS — C259 Malignant neoplasm of pancreas, unspecified: Secondary | ICD-10-CM

## 2020-07-10 MED ORDER — SODIUM CHLORIDE 0.9% FLUSH
10.0000 mL | INTRAVENOUS | Status: DC | PRN
  Administered 2020-07-10: 10 mL
  Filled 2020-07-10: qty 10

## 2020-07-10 MED ORDER — HEPARIN SOD (PORK) LOCK FLUSH 100 UNIT/ML IV SOLN
500.0000 [IU] | Freq: Once | INTRAVENOUS | Status: AC | PRN
  Administered 2020-07-10: 500 [IU]
  Filled 2020-07-10: qty 5

## 2020-07-10 MED ORDER — HEPARIN SOD (PORK) LOCK FLUSH 100 UNIT/ML IV SOLN
INTRAVENOUS | Status: AC
  Filled 2020-07-10: qty 5

## 2020-07-10 NOTE — Telephone Encounter (Signed)
Nutrition Follow-up:  Patient with pancreatic cancer with mets to liver.  Followed by Dr Tasia Catchings and receiving chemotherapy (fourth line)   Spoke with patient via phone for nutrition follow-up.  Patient reports that his appetite is down.  Drinking about 2 ensure per day.  Trying to eat 3 meals per day but not very much.  Ate for dinner last night green beans, pintos and salmon cake.  Reports that he has tried all the chemotherapy he can try.    Medications: reviewed  Labs: reviewed  Anthropometrics:   Weight 166 lb on 9/28 decreased from 178 lb in July 2021.    7% weight loss in the last 2 months, concerning  NUTRITION DIAGNOSIS: Inadequate oral intake continues   INTERVENTION:  Encouraged eating high calorie, high protein foods as able. Encouraged high calorie shakes as able. RD available as needed    MONITORING, EVALUATION, GOAL: weight trends, intake   NEXT VISIT: as needed  Craig [name], [place], [place] Registered Dietitian [phone] (mobile)

## 2020-07-22 ENCOUNTER — Inpatient Hospital Stay (HOSPITAL_BASED_OUTPATIENT_CLINIC_OR_DEPARTMENT_OTHER): Payer: Medicare Other | Admitting: Oncology

## 2020-07-22 ENCOUNTER — Encounter: Payer: Self-pay | Admitting: Oncology

## 2020-07-22 ENCOUNTER — Other Ambulatory Visit: Payer: Self-pay

## 2020-07-22 ENCOUNTER — Inpatient Hospital Stay: Payer: Medicare Other | Attending: Oncology

## 2020-07-22 ENCOUNTER — Inpatient Hospital Stay: Payer: Medicare Other

## 2020-07-22 VITALS — BP 153/77 | HR 86 | Temp 98.2°F | Resp 18 | Wt 166.3 lb

## 2020-07-22 DIAGNOSIS — C787 Secondary malignant neoplasm of liver and intrahepatic bile duct: Secondary | ICD-10-CM

## 2020-07-22 DIAGNOSIS — N281 Cyst of kidney, acquired: Secondary | ICD-10-CM | POA: Diagnosis not present

## 2020-07-22 DIAGNOSIS — R5382 Chronic fatigue, unspecified: Secondary | ICD-10-CM | POA: Diagnosis not present

## 2020-07-22 DIAGNOSIS — C259 Malignant neoplasm of pancreas, unspecified: Secondary | ICD-10-CM

## 2020-07-22 DIAGNOSIS — K219 Gastro-esophageal reflux disease without esophagitis: Secondary | ICD-10-CM | POA: Insufficient documentation

## 2020-07-22 DIAGNOSIS — R5383 Other fatigue: Secondary | ICD-10-CM | POA: Insufficient documentation

## 2020-07-22 DIAGNOSIS — I700 Atherosclerosis of aorta: Secondary | ICD-10-CM | POA: Insufficient documentation

## 2020-07-22 DIAGNOSIS — Z5111 Encounter for antineoplastic chemotherapy: Secondary | ICD-10-CM | POA: Diagnosis not present

## 2020-07-22 DIAGNOSIS — R948 Abnormal results of function studies of other organs and systems: Secondary | ICD-10-CM | POA: Diagnosis not present

## 2020-07-22 DIAGNOSIS — G629 Polyneuropathy, unspecified: Secondary | ICD-10-CM | POA: Insufficient documentation

## 2020-07-22 DIAGNOSIS — M25461 Effusion, right knee: Secondary | ICD-10-CM | POA: Diagnosis not present

## 2020-07-22 DIAGNOSIS — E119 Type 2 diabetes mellitus without complications: Secondary | ICD-10-CM | POA: Insufficient documentation

## 2020-07-22 DIAGNOSIS — R200 Anesthesia of skin: Secondary | ICD-10-CM | POA: Diagnosis not present

## 2020-07-22 DIAGNOSIS — R109 Unspecified abdominal pain: Secondary | ICD-10-CM | POA: Insufficient documentation

## 2020-07-22 DIAGNOSIS — C250 Malignant neoplasm of head of pancreas: Secondary | ICD-10-CM | POA: Diagnosis present

## 2020-07-22 DIAGNOSIS — Z809 Family history of malignant neoplasm, unspecified: Secondary | ICD-10-CM | POA: Diagnosis not present

## 2020-07-22 DIAGNOSIS — Z808 Family history of malignant neoplasm of other organs or systems: Secondary | ICD-10-CM | POA: Diagnosis not present

## 2020-07-22 DIAGNOSIS — D6481 Anemia due to antineoplastic chemotherapy: Secondary | ICD-10-CM

## 2020-07-22 DIAGNOSIS — C7801 Secondary malignant neoplasm of right lung: Secondary | ICD-10-CM | POA: Insufficient documentation

## 2020-07-22 DIAGNOSIS — E876 Hypokalemia: Secondary | ICD-10-CM | POA: Insufficient documentation

## 2020-07-22 DIAGNOSIS — T451X5A Adverse effect of antineoplastic and immunosuppressive drugs, initial encounter: Secondary | ICD-10-CM

## 2020-07-22 DIAGNOSIS — Z836 Family history of other diseases of the respiratory system: Secondary | ICD-10-CM | POA: Insufficient documentation

## 2020-07-22 DIAGNOSIS — Z87891 Personal history of nicotine dependence: Secondary | ICD-10-CM | POA: Diagnosis not present

## 2020-07-22 DIAGNOSIS — Z23 Encounter for immunization: Secondary | ICD-10-CM | POA: Diagnosis not present

## 2020-07-22 DIAGNOSIS — Z79899 Other long term (current) drug therapy: Secondary | ICD-10-CM | POA: Insufficient documentation

## 2020-07-22 LAB — COMPREHENSIVE METABOLIC PANEL
ALT: 74 U/L — ABNORMAL HIGH (ref 0–44)
AST: 47 U/L — ABNORMAL HIGH (ref 15–41)
Albumin: 3.4 g/dL — ABNORMAL LOW (ref 3.5–5.0)
Alkaline Phosphatase: 85 U/L (ref 38–126)
Anion gap: 10 (ref 5–15)
BUN: 19 mg/dL (ref 8–23)
CO2: 24 mmol/L (ref 22–32)
Calcium: 8.8 mg/dL — ABNORMAL LOW (ref 8.9–10.3)
Chloride: 105 mmol/L (ref 98–111)
Creatinine, Ser: 1.13 mg/dL (ref 0.61–1.24)
GFR, Estimated: >60 mL/min (ref >60)
Glucose, Bld: 151 mg/dL — ABNORMAL HIGH (ref 70–99)
Potassium: 3.6 mmol/L (ref 3.5–5.1)
Sodium: 139 mmol/L (ref 135–145)
Total Bilirubin: 0.7 mg/dL (ref 0.3–1.2)
Total Protein: 6.8 g/dL (ref 6.5–8.1)

## 2020-07-22 LAB — CBC WITH DIFFERENTIAL/PLATELET
Abs Immature Granulocytes: 0.04 10*3/uL (ref 0.00–0.07)
Basophils Absolute: 0 10*3/uL (ref 0.0–0.1)
Basophils Relative: 1 %
Eosinophils Absolute: 0.1 10*3/uL (ref 0.0–0.5)
Eosinophils Relative: 2 %
HCT: 30 % — ABNORMAL LOW (ref 39.0–52.0)
Hemoglobin: 9.7 g/dL — ABNORMAL LOW (ref 13.0–17.0)
Immature Granulocytes: 1 %
Lymphocytes Relative: 16 %
Lymphs Abs: 0.8 10*3/uL (ref 0.7–4.0)
MCH: 29.5 pg (ref 26.0–34.0)
MCHC: 32.3 g/dL (ref 30.0–36.0)
MCV: 91.2 fL (ref 80.0–100.0)
Monocytes Absolute: 0.7 10*3/uL (ref 0.1–1.0)
Monocytes Relative: 14 %
Neutro Abs: 3.3 10*3/uL (ref 1.7–7.7)
Neutrophils Relative %: 66 %
Platelets: 243 10*3/uL (ref 150–400)
RBC: 3.29 MIL/uL — ABNORMAL LOW (ref 4.22–5.81)
RDW: 16 % — ABNORMAL HIGH (ref 11.5–15.5)
WBC: 4.9 10*3/uL (ref 4.0–10.5)
nRBC: 0 % (ref 0.0–0.2)

## 2020-07-22 MED ORDER — DEXTROSE 5 % IV SOLN
Freq: Once | INTRAVENOUS | Status: AC
  Filled 2020-07-22: qty 250

## 2020-07-22 MED ORDER — SODIUM CHLORIDE 0.9% FLUSH
10.0000 mL | INTRAVENOUS | Status: DC | PRN
  Administered 2020-07-22: 10 mL via INTRAVENOUS
  Filled 2020-07-22: qty 10

## 2020-07-22 MED ORDER — SODIUM CHLORIDE 0.9 % IV SOLN
10.0000 mg | Freq: Once | INTRAVENOUS | Status: AC
  Administered 2020-07-22: 10 mg via INTRAVENOUS
  Filled 2020-07-22: qty 10

## 2020-07-22 MED ORDER — SODIUM CHLORIDE 0.9 % IV SOLN
2400.0000 mg/m2 | INTRAVENOUS | Status: DC
  Administered 2020-07-22: 4700 mg via INTRAVENOUS
  Filled 2020-07-22: qty 94

## 2020-07-22 MED ORDER — OXALIPLATIN CHEMO INJECTION 100 MG/20ML
85.0000 mg/m2 | Freq: Once | INTRAVENOUS | Status: AC
  Administered 2020-07-22: 165 mg via INTRAVENOUS
  Filled 2020-07-22: qty 33

## 2020-07-22 MED ORDER — SODIUM CHLORIDE 0.9% FLUSH
10.0000 mL | INTRAVENOUS | Status: DC | PRN
  Filled 2020-07-22: qty 10

## 2020-07-22 MED ORDER — LEUCOVORIN CALCIUM INJECTION 350 MG
410.0000 mg/m2 | Freq: Once | INTRAVENOUS | Status: AC
  Administered 2020-07-22: 800 mg via INTRAVENOUS
  Filled 2020-07-22: qty 40

## 2020-07-22 MED ORDER — PALONOSETRON HCL INJECTION 0.25 MG/5ML
0.2500 mg | Freq: Once | INTRAVENOUS | Status: AC
  Administered 2020-07-22: 0.25 mg via INTRAVENOUS
  Filled 2020-07-22: qty 5

## 2020-07-22 MED ORDER — INFLUENZA VAC A&B SA ADJ QUAD 0.5 ML IM PRSY
0.5000 mL | PREFILLED_SYRINGE | Freq: Once | INTRAMUSCULAR | Status: AC
  Administered 2020-07-22: 0.5 mL via INTRAMUSCULAR
  Filled 2020-07-22: qty 0.5

## 2020-07-22 NOTE — Progress Notes (Signed)
Hematology/Oncology  Follow up note [facility] [phone] [phone]   Patient Care Team: Paulene Floor as PCP - General (Physician Assistant) Clent Jacks, RN as Oncology Nurse Navigator Earlie Server, MD as Consulting Physician (Hematology and Oncology) Neldon Labella, RN as Case Manager Lonia Farber, MD as Consulting Physician (Internal Medicine)  REFERRING PROVIDER: Trinna Post, PA-C  CHIEF COMPLAINTS/REASON FOR VISIT:  Follow up for treatment of pancreatic cancer  HISTORY OF PRESENTING ILLNESS:   Craig [name] is a  74 y.o.  male with PMH listed below, including CABG x5, PVD, hypertension, former tobacco abuse, former alcohol abuse, iron deficiency anemia, was seen in consultation at the request of  Terrilee Croak, Adriana M, PA-C  for evaluation of abnormal CT Patient recently presented to primary care provider complaining for abdominal pain radiating to his back for about 10 days.  Patient uses Tylenol as needed for pain. Work-up showed elevated amylase, lipase, mild anemia with hemoglobin of 11.3 Acute hepatitis panel negative. 05/16/2019 CT abdomen pelvis with contrast showed poorly marginated heterogeneous hypodense 2.9 cm pancreatic mass at the uncinate process, worrisome for primary pancreatic cancer.  No biliary or pancreatic duct dilation. Several ill defined hypodense liver masses scattered throughout the liver, largest 3.1 cm in the segment 6 right liver lobe. Nonspecific portacaval and aortocaval adenopathy. Extreme medial left lung base 4.4 cm pleural-based mass probably a pleural metastasis.  Additional tiny pulmonary nodules scattered at the right lung base are indeterminate. Mild prostate or megaly  He had a history of 37.5-pack-year smoking history quitting 2018. No longer drinking alcohol.  # ultrasound-guided liver biopsy on 05/21/2019.  Pathology showed fragments of benign hepatic parenchyma showing  minimal macro vascular steatosis, otherwise no significant histo pathologic change. Single core of renal tissue showing approximately 25% glomerulosclerosis  # #History of CAD, status post CABG x5.  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  # Omniseq test showed PD-L1 50% TPS, TMB high, negative for BRCA1/2 or NTRK fusion.  MSI could not be completed.  Patient declines genetic testing # 06/07/2019 -1st line chemotherapy Gemcitabine and abraxane. 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 # 11/14/2019- progression as well as profound weekend fatigue, 2nd line treatment with keytruda # 01/07/2020 -progression, start third line chemotherapy treatment 5-FU with liposomal irinotecan 01/24/2020-01/26/2020 patient was admitted to ICU due to DKA. on long-acting insulin 20 units at night and takes sliding scale with meals.  INTERVAL HISTORY Craig [name] is a 74 y.o. male who has above history reviewed by me today presents for evaluation of chemotherapy tolerability for metastatic pancreatic cancer.   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  No numbness or tingling in his fingertips. Feels more fatigued.  Appetite is fair.  Weight has been stable. Denies any abdominal pain. He noticed that right knee swollen up again during interval.  Today swelling is better but not completely resolved.  Denies any fever, chills,.. Review of Systems  Constitutional: Positive for fatigue and unexpected weight change. Negative for appetite change, chills and fever.  HENT:   Negative for hearing loss and voice change.   Eyes: Negative for eye problems and icterus.  Respiratory: Negative for chest tightness, cough and shortness of breath.   Cardiovascular: Negative for chest pain and leg swelling.  Gastrointestinal:  Negative for abdominal distention  and abdominal pain.  Endocrine: Negative for hot flashes.  Genitourinary: Negative for difficulty urinating, dysuria and frequency.   Musculoskeletal: Negative for arthralgias.       Knee swelling  Skin: Negative for itching and rash.  Neurological: Positive for numbness. Negative for light-headedness.  Hematological: Negative for adenopathy. Does not bruise/bleed easily.  Psychiatric/Behavioral: Negative for confusion.    MEDICAL HISTORY:  Past Medical History:  Diagnosis Date  . Allergic rhinitis   . CHF (congestive heart failure) ([place])   . Chronic cholecystitis   . Coronary artery disease   . DDD (degenerative disc disease), cervical   . Dehydration 06/21/2019  . Diabetes mellitus without complication ([place])   . Dyspnea   . Early cataract   . Erectile dysfunction   . GERD (gastroesophageal reflux disease)   . Gouty arthritis   . Headache    occasional migraines  . Hypercalcemia   . Hyperlipemia   . Hypertension   . Palpitations   . Pancreatic cancer metastasized to liver ([place]) 05/2019   Chemo tx's  . Peripheral vascular disease ([place])   . 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   LIMA to LAD SVG to [place] SVG SEQUENTIALLY to OM1 and OM2 SVG to ACUTE MARGINAL  . Spinal stenosis of cervical region   . Vitamin D deficiency     SURGICAL HISTORY: Past Surgical History:  Procedure Laterality Date  . BACK SURGERY  2018    fusion with screws  . CHOLECYSTECTOMY N/A 11/13/2018   Procedure: LAPAROSCOPIC CHOLECYSTECTOMY;  Surgeon: Vickie Epley, MD;  Location: [facility];  Service: General;  Laterality: N/A;  . COLONOSCOPY    . CORONARY ARTERY BYPASS GRAFT N/A 09/30/2017   Procedure: CORONARY ARTERY BYPASS GRAFTING times five using right and left Saphaneous vein harvested endoscopicly  and left internal mammary artery. (CABG),TEE;  Surgeon: Rexene Alberts, MD;  Location: [place];  Service: Open Heart Surgery;  Laterality: N/A;  . LEFT HEART CATH AND  CORONARY ANGIOGRAPHY Left 09/06/2017   Procedure: LEFT HEART CATH AND CORONARY ANGIOGRAPHY;  Surgeon: Corey Skains, MD;  Location: [place] [facility];  Service: Cardiovascular;  Laterality: Left;  . percutaneous transluminal balloon angioplasty  01/2010   of left lower extremity  . PORTA CATH INSERTION N/A 06/06/2019   Procedure: PORTA CATH INSERTION;  Surgeon: Katha Cabal, MD;  Location: [place] [facility];  Service: Cardiovascular;  Laterality: N/A;  . TEE WITHOUT CARDIOVERSION N/A 09/30/2017   Procedure: TRANSESOPHAGEAL ECHOCARDIOGRAM (TEE);  Surgeon: Rexene Alberts, MD;  Location: [place];  Service: Open Heart Surgery;  Laterality: N/A;  . VASCULAR SURGERY Left 2010   left exernal iliac and superficial femoral artery PTCA and stenting    SOCIAL HISTORY: Social History   Socioeconomic History  . Marital status: Married    Spouse name: Enid Derry  . Number of children: 2  . Years of education: Not on file  . Highest education level: 5th grade  Occupational History  . Occupation: drove tractors    Comment: retired  Tobacco Use  . Smoking status: Former Smoker    Packs/day: 0.75    Years: 50.00    Pack years: 37.50    Types: Cigarettes    Quit date: 09/27/2017    Years since quitting: 2.8  . Smokeless tobacco: Former Systems developer    Types: Secondary school teacher  . Vaping Use: Never used  Substance and Sexual Activity  . Alcohol use: Not Currently    Comment: stopped drinking  before cabg  . Drug use: Not Currently    Types: Marijuana    Comment: stopped smoking before CABG  . Sexual activity: Not Currently  Other Topics Concern  . Not on file  Social History Narrative  . Not on file   Social Determinants of Health   Financial Resource Strain: Low Risk   . Difficulty of Paying Living Expenses: Not hard at all  Food Insecurity: No Food Insecurity  . Worried About Programme researcher, broadcasting/film/video in the Last Year: Never true  . Ran Out of Food in the Last Year: Never true    Transportation Needs: No Transportation Needs  . Lack of Transportation (Medical): No  . Lack of Transportation (Non-Medical): No  Physical Activity: Inactive  . Days of Exercise per Week: 0 days  . Minutes of Exercise per Session: 0 min  Stress: No Stress Concern Present  . Feeling of Stress : Not at all  Social Connections: Moderately Isolated  . Frequency of Communication with Friends and Family: More than three times a week  . Frequency of Social Gatherings with Friends and Family: More than three times a week  . Attends Religious Services: Never  . Active Member of Clubs or Organizations: No  . Attends Banker Meetings: Never  . Marital Status: Married  Catering manager Violence: Not At Risk  . Fear of Current or Ex-Partner: No  . Emotionally Abused: No  . Physically Abused: No  . Sexually Abused: No    FAMILY HISTORY: Family History  Problem Relation Age of Onset  . Brain cancer Mother   . Emphysema Father   . Cancer Brother     ALLERGIES:  is allergic to lipitor [atorvastatin], zetia [ezetimibe], lisinopril, protonix [pantoprazole], and spironolactone.  MEDICATIONS:  Current Outpatient Medications  Medication Sig Dispense Refill  . aspirin EC 81 MG tablet Take 81 mg by mouth daily.    . carvedilol (COREG) 6.25 MG tablet Take 6.25 mg by mouth 2 (two) times daily with a meal.  3  . Continuous Blood Gluc Receiver (FREESTYLE LIBRE 14 DAY READER) DEVI 1 Device by Does not apply route every 14 (fourteen) days. 6 each 1  . diltiazem (CARDIZEM CD) 300 MG 24 hr capsule TAKE 1 CAPSULE BY MOUTH EVERY DAY 90 capsule 0  . docusate sodium (COLACE) 100 MG capsule Take 1 capsule (100 mg total) by mouth daily. 30 capsule 0  . dorzolamide-timolol (COSOPT) 22.3-6.8 MG/ML ophthalmic solution INSTILL ONE DROP INTO BOTH EYES TWICE DAILY    . DULoxetine (CYMBALTA) 30 MG capsule TAKE 2 CAPSULES BY MOUTH EVERY DAY 180 capsule 1  . ferrous sulfate 325 (65 FE) MG tablet TAKE 1  TABLET BY MOUTH EVERY DAY WITH BREAKFAST 90 tablet 1  . ibuprofen (ADVIL) 800 MG tablet Take 1 tablet (800 mg total) by mouth every 8 (eight) hours as needed. 30 tablet 0  . insulin glargine (LANTUS SOLOSTAR) 100 UNIT/ML Solostar Pen Inject 20 Units into the skin at bedtime. (Patient taking differently: Inject 16 Units into the skin at bedtime. Taking 10U) 15 mL 0  . insulin lispro (HUMALOG) 100 UNIT/ML KwikPen Inject 0.05 mLs (5 Units total) into the skin 3 (three) times daily with meals. 15 mL 11  . Insulin Pen Needle (PEN NEEDLES) 32G X 5 MM MISC Use four times daily for insulin 400 each 1  . latanoprost (XALATAN) 0.005 % ophthalmic solution Place 1 drop into both eyes at bedtime.   3  . lidocaine-prilocaine (EMLA) cream  APPLY TO AFFECTED AREA ONCE AS DIRECTED    . omeprazole (PRILOSEC) 40 MG capsule TAKE 1 CAPSULE BY MOUTH EVERY DAY 90 capsule 1  . ONETOUCH VERIO test strip TEST FASTING SUGAR EVERY MORNING 100 strip 3  . rosuvastatin (CRESTOR) 10 MG tablet Take 1 tablet by mouth daily.    [name] Kitchen triamcinolone ointment (KENALOG) 0.5 % APPLY 1 APPLICATION TOPICALLY 3 (THREE) TIMES DAILY. 30 g 0  . Multiple Vitamin (MULTIVITAMIN WITH MINERALS) TABS tablet Take 1 tablet by mouth daily. (Patient not taking: Reported on 07/08/2020)    . OMEGA-3 FATTY ACIDS PO Take 1,200 mg by mouth daily.  (Patient not taking: Reported on 07/08/2020)    . oxyCODONE (OXY IR/ROXICODONE) 5 MG immediate release tablet Take 1 tablet (5 mg total) by mouth every 6 (six) hours as needed for severe pain. (Patient not taking: Reported on 04/15/2020) 30 tablet 0  . potassium chloride (KLOR-CON) 10 MEQ tablet TAKE 1 TABLET BY MOUTH EVERY DAY (Patient not taking: Reported on 07/22/2020) 30 tablet 2   No current facility-administered medications for this visit.   Facility-Administered Medications Ordered in Other Visits  Medication Dose Route Frequency Provider Last Rate Last Admin  . fluorouracil (ADRUCIL) 4,700 mg in sodium chloride  0.9 % 56 mL chemo infusion  2,400 mg/m2 (Treatment Plan Recorded) Intravenous 1 day or 1 dose Earlie Server, MD      . influenza vaccine adjuvanted (FLUAD) injection 0.5 mL  0.5 mL Intramuscular Once Earlie Server, MD      . leucovorin 800 mg in dextrose 5 % 250 mL infusion  410 mg/m2 (Treatment Plan Recorded) Intravenous Once Earlie Server, MD 145 mL/hr at 07/22/20 1048 800 mg at 07/22/20 1048  . oxaliplatin (ELOXATIN) 165 mg in dextrose 5 % 500 mL chemo infusion  85 mg/m2 (Treatment Plan Recorded) Intravenous Once Earlie Server, MD 267 mL/hr at 07/22/20 1046 165 mg at 07/22/20 1046  . sodium chloride flush (NS) 0.9 % injection 10 mL  10 mL Intracatheter PRN Earlie Server, MD      . sodium chloride flush (NS) 0.9 % injection 10 mL  10 mL Intravenous Once Earlie Server, MD      . sodium chloride flush (NS) 0.9 % injection 10 mL  10 mL Intravenous PRN Earlie Server, MD   10 mL at 07/22/20 0842  . sodium chloride flush (NS) 0.9 % injection 10 mL  10 mL Intracatheter PRN Earlie Server, MD         PHYSICAL EXAMINATION: ECOG PERFORMANCE STATUS: 1 - Symptomatic but completely ambulatory Vitals:   07/22/20 0913  BP: (!) 153/77  Pulse: 86  Resp: 18  Temp: 98.2 F (36.8 C)   Filed Weights   07/22/20 0913  Weight: 166 lb 4.8 oz (75.4 kg)    Physical Exam Constitutional:      General: He is not in acute distress. HENT:     Head: Normocephalic and atraumatic.  Eyes:     General: No scleral icterus. Cardiovascular:     Rate and Rhythm: Normal rate and regular rhythm.     Heart sounds: Normal heart sounds.  Pulmonary:     Effort: Pulmonary effort is normal. No respiratory distress.     Breath sounds: No wheezing.  Abdominal:     General: Bowel sounds are normal. There is no distension.     Palpations: Abdomen is soft.  Musculoskeletal:        General: No deformity. Normal range of motion.     Cervical  back: Normal range of motion and neck supple.  Skin:    General: Skin is warm and dry.     Findings: No erythema or rash.    Neurological:     Mental Status: He is alert and oriented to person, place, and time. Mental status is at baseline.     Cranial Nerves: No cranial nerve deficit.     Coordination: Coordination normal.  Psychiatric:        Mood and Affect: Mood normal.     LABORATORY DATA:  I have reviewed the data as listed Lab Results  Component Value Date   WBC 4.9 07/22/2020   HGB 9.7 (L) 07/22/2020   HCT 30.0 (L) 07/22/2020   MCV 91.2 07/22/2020   PLT 243 07/22/2020   Recent Labs    06/18/20 0926 06/18/20 0926 06/24/20 0826 07/08/20 0841 07/22/20 0831  NA 137   < > 139 139 139  K 4.0   < > 4.4 3.8 3.6  CL 104   < > 103 107 105  CO2 22   < > $R'25 23 24  'hC$ GLUCOSE 134*   < > 149* 161* 151*  BUN 16   < > $R'21 17 19  'OL$ CREATININE 1.06   < > 0.98 1.05 1.13  CALCIUM 8.8*   < > 9.2 8.8* 8.8*  GFRNONAA >60   < > >60 >60 >60  GFRAA >60  --  >60 >60  --   PROT 6.6   < > 6.9 6.5 6.8  ALBUMIN 3.5   < > 3.6 3.4* 3.4*  AST 36   < > 48* 54* 47*  ALT 76*   < > 90* 97* 74*  ALKPHOS 84   < > 83 89 85  BILITOT 0.5   < > 0.6 0.6 0.7   < > = values in this interval not displayed.   Iron/TIBC/Ferritin/ %Sat    Component Value Date/Time   IRON 119 04/27/2019 0940   TIBC 363 04/27/2019 0940   FERRITIN 58 04/27/2019 0940   IRONPCTSAT 33 04/27/2019 0940      RADIOGRAPHIC STUDIES: I have personally reviewed the radiological images as listed and agreed with the findings in the report. CT Chest W Contrast  Result Date: 04/23/2020 CLINICAL DATA:  Restaging metastatic pancreatic cancer. Interval chemotherapy. EXAM: CT CHEST, ABDOMEN, AND PELVIS WITH CONTRAST TECHNIQUE: Multidetector CT imaging of the chest, abdomen and pelvis was performed following the standard protocol during bolus administration of intravenous contrast. CONTRAST:  121mL OMNIPAQUE IOHEXOL 300 MG/ML  SOLN COMPARISON:  01/02/2020 FINDINGS: CT CHEST FINDINGS Cardiovascular: Right Port-A-Cath tip: SVC. Coronary, aortic arch, and branch vessel  atherosclerotic vascular disease. Prior CABG. Mediastinum/Nodes: Left infrahilar node 0.9 cm in short axis on image 30/2, previously the same by my measurements. Right paratracheal node 0.8 cm in short axis on image 13/2, previously 0.9 cm by my measurements. Lungs/Pleura: Ill-defined 0.5 cm subpleural nodule in the right upper lobe on image 66/3 likely corresponding to the slightly more solid-appearing 0.5 cm nodule shown on the prior exam. 3 mm subpleural nodule in the right upper lobe on image 67/3, stable. Stable 3 mm right middle lobe subpleural nodule. 0.5 cm right lower lobe subpleural nodule on image 102/3, stable. Other minimal subpleural nodularity noted on the right side, stable. A pleural mass along the left hemidiaphragm measures 2.1 cm in short axis thickness on image 52/4, previously the same. This was previously not hypermetabolic on 56/38/7564. Musculoskeletal: Thoracic spondylosis. Partial fusion of the T3-4 level  including the posterior elements, likely congenital. Stable anterior wedge compression at T5. CT ABDOMEN PELVIS FINDINGS Hepatobiliary: Scattered hepatic metastatic lesions are present. Ill-defined margins, but roughly similar size and distribution to the 01/02/2020 exam. A index right hepatic lobe lesion measuring 2.2 cm transversely on image 49/2 is similar in overall size. Most of the lesions have slightly less indistinct borders today compared to previous. Cholecystectomy.  No biliary dilatation noted. Pancreas: Reduced size of the pancreas potentially from atrophy or resolution of prior inflammation, I favor the former. Mildly prominent dorsal pancreatic duct extending to the somewhat prominent pancreatic head which appears to have accentuated activity compared to the rest of the pancreatic parenchyma. Presumed mass in the pancreatic head region measures proximally 3.6 by 2.8 cm on image 66/3, this was previously larger at about 4.2 by 3.8 cm on 01/02/2020. Admittedly the margins  between this lesion and rest of the pancreas are difficult to discern, and I could be including some normal pancreatic tissue in these measurements. Spleen: Unremarkable Adrenals/Urinary Tract: Both adrenal glands appear normal. Bilateral renal cysts are identified. Superficial to a left mid kidney cyst on image 64/2, and more complex lesion measuring 1.5 by 0.8 cm is stable, and is probably a complex cyst based on the pre contrast hyperdensity shown on the PET-CT of 06/04/2019. Other potentially complex left renal cysts are present, and were likewise hyperdense on prior PET-CT. Urinary bladder unremarkable. Stomach/Bowel: Gastric wall thickening particularly along the greater curvature. This was present on prior PET-CT of 06/04/2019 as well, but not hypermetabolic, and may be primarily from nondistention. Vascular/Lymphatic: Aortoiliac atherosclerotic vascular disease. Portacaval node 0.9 cm in short axis on image 59/2, previously 1.0 cm by my measurement. Reproductive: Borderline prostatomegaly. Other: No supplemental non-categorized findings. Musculoskeletal: Posterolateral rod and pedicle screw fixation with interbody spacer at the L4-5 level. No findings of osseous metastatic disease. IMPRESSION: 1. Overall similar appearance of the scattered hepatic metastatic lesions. Mass in the pancreatic head region is moderately reduced in size compared to the prior exam. 2. Worsening pancreatic atrophy. 3. Stable pleural based mass along the left hemidiaphragm. 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 4. Several tiny subpleural pulmonary nodules are stable. 5. Gastric wall thickening particularly along the greater curvature, probably from nondistention. This was present but not hypermetabolic on prior PET-CT of 06/04/2019. 6. Bilateral renal cysts, of varying complexity on the left. 7. Aortic atherosclerosis. Aortic Atherosclerosis (ICD10-I70.0). Electronically  Signed   By: Van Clines M.D.   On: 04/23/2020 15:01   CT Abdomen Pelvis W Contrast  Result Date: 04/23/2020 CLINICAL DATA:  Restaging metastatic pancreatic cancer. Interval chemotherapy. EXAM: CT CHEST, ABDOMEN, AND PELVIS WITH CONTRAST TECHNIQUE: Multidetector CT imaging of the chest, abdomen and pelvis was performed following the standard protocol during bolus administration of intravenous contrast. CONTRAST:  166mL OMNIPAQUE IOHEXOL 300 MG/ML  SOLN COMPARISON:  01/02/2020 FINDINGS: CT CHEST FINDINGS Cardiovascular: Right Port-A-Cath tip: SVC. Coronary, aortic arch, and branch vessel atherosclerotic vascular disease. Prior CABG. Mediastinum/Nodes: Left infrahilar node 0.9 cm in short axis on image 30/2, previously the same by my measurements. Right paratracheal node 0.8 cm in short axis on image 13/2, previously 0.9 cm by my measurements. Lungs/Pleura: Ill-defined 0.5 cm subpleural nodule in the right upper lobe on image 66/3 likely corresponding to the slightly more solid-appearing 0.5 cm nodule shown on the prior exam. 3 mm subpleural nodule in the right upper lobe on image 67/3, stable. Stable 3  mm right middle lobe subpleural nodule. 0.5 cm right lower lobe subpleural nodule on image 102/3, stable. Other minimal subpleural nodularity noted on the right side, stable. A pleural mass along the left hemidiaphragm measures 2.1 cm in short axis thickness on image 52/4, previously the same. This was previously not hypermetabolic on 62/70/3500. Musculoskeletal: Thoracic spondylosis. Partial fusion of the T3-4 level including the posterior elements, likely congenital. Stable anterior wedge compression at T5. CT ABDOMEN PELVIS FINDINGS Hepatobiliary: Scattered hepatic metastatic lesions are present. Ill-defined margins, but roughly similar size and distribution to the 01/02/2020 exam. A index right hepatic lobe lesion measuring 2.2 cm transversely on image 49/2 is similar in overall size. Most of the lesions  have slightly less indistinct borders today compared to previous. Cholecystectomy.  No biliary dilatation noted. Pancreas: Reduced size of the pancreas potentially from atrophy or resolution of prior inflammation, I favor the former. Mildly prominent dorsal pancreatic duct extending to the somewhat prominent pancreatic head which appears to have accentuated activity compared to the rest of the pancreatic parenchyma. Presumed mass in the pancreatic head region measures proximally 3.6 by 2.8 cm on image 66/3, this was previously larger at about 4.2 by 3.8 cm on 01/02/2020. Admittedly the margins between this lesion and rest of the pancreas are difficult to discern, and I could be including some normal pancreatic tissue in these measurements. Spleen: Unremarkable Adrenals/Urinary Tract: Both adrenal glands appear normal. Bilateral renal cysts are identified. Superficial to a left mid kidney cyst on image 64/2, and more complex lesion measuring 1.5 by 0.8 cm is stable, and is probably a complex cyst based on the pre contrast hyperdensity shown on the PET-CT of 06/04/2019. Other potentially complex left renal cysts are present, and were likewise hyperdense on prior PET-CT. Urinary bladder unremarkable. Stomach/Bowel: Gastric wall thickening particularly along the greater curvature. This was present on prior PET-CT of 06/04/2019 as well, but not hypermetabolic, and may be primarily from nondistention. Vascular/Lymphatic: Aortoiliac atherosclerotic vascular disease. Portacaval node 0.9 cm in short axis on image 59/2, previously 1.0 cm by my measurement. Reproductive: Borderline prostatomegaly. Other: No supplemental non-categorized findings. Musculoskeletal: Posterolateral rod and pedicle screw fixation with interbody spacer at the L4-5 level. No findings of osseous metastatic disease. IMPRESSION: 1. Overall similar appearance of the scattered hepatic metastatic lesions. Mass in the pancreatic head region is moderately  reduced in size compared to the prior exam. 2. Worsening pancreatic atrophy. 3. Stable pleural based mass along the left hemidiaphragm. 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 4. Several tiny subpleural pulmonary nodules are stable. 5. Gastric wall thickening particularly along the greater curvature, probably from nondistention. This was present but not hypermetabolic on prior PET-CT of 06/04/2019. 6. Bilateral renal cysts, of varying complexity on the left. 7. Aortic atherosclerosis. Aortic Atherosclerosis (ICD10-I70.0). Electronically Signed   By: Van Clines M.D.   On: 04/23/2020 15:01   CT CHEST ABDOMEN PELVIS W CONTRAST  Result Date: 06/04/2020 CLINICAL DATA:  Pancreatic cancer.  On going chemotherapy EXAM: CT CHEST, ABDOMEN, AND PELVIS WITH CONTRAST TECHNIQUE: Multidetector CT imaging of the chest, abdomen and pelvis was performed following the standard protocol during bolus administration of intravenous contrast. CONTRAST:  172mL OMNIPAQUE IOHEXOL 300 MG/ML  SOLN COMPARISON:  CT 04/24/2019 FINDINGS: CT CHEST FINDINGS Cardiovascular: Port in the anterior chest wall with tip in distal SVC. Post CABG Mediastinum/Nodes: No axillary or supraclavicular adenopathy. No mediastinal or hilar adenopathy. No pericardial fluid. Esophagus normal.  Lungs/Pleura: Several small subpleural nodules in the RIGHT lower lobe are unchanged. Largest nodule measures 3 mm on image 91/4. Again demonstrated lesion along the medial LEFT pleural surface adjacent to the descending thoracic aorta measuring 4.2 cm and unchanged prior (image 46/2). Musculoskeletal: No aggressive osseous lesion. CT ABDOMEN AND PELVIS FINDINGS Hepatobiliary: Multiple poorly marginated hypoenhancing lesions are present within the LEFT RIGHT hepatic lobe. Visually these lesions appear increased in size compared to prior. For example lesion the posterior RIGHT hepatic lobe measuring  approximately 3.4 cm in diameter (image 47/2) compares to 2.1 cm. Lesion in the anterior aspect RIGHT hepatic lobe measuring 2.7 cm (image 47) compares with 1.9 cm. Small lesion in the caudate lobe measuring 1.1 cm compares to 0.5 cm. Postcholecystectomy Pancreas: Fullness in the head of the pancreas measuring 3.0 x 2.7 cm compares with 3.2 x 2.2 cm for some decrease in size. Atrophy of body and tail the pancreas. Spleen: Normal spleen Adrenals/urinary tract: Adrenal glands and kidneys are normal. The ureters and bladder normal. Stomach/Bowel: Stomach, small bowel, appendix, and cecum are normal. The colon and rectosigmoid colon are normal. Vascular/Lymphatic: Abdominal aorta is normal caliber. There is no retroperitoneal or periportal lymphadenopathy. No pelvic lymphadenopathy. Reproductive: Prostate normal Other: No free fluid. Musculoskeletal: No aggressive osseous lesion. IMPRESSION: 1. Moderate increase in size of multifocal hepatic metastasis. 2. Mass in the head of the pancreas measures slightly smaller. 3. No evidence of new metastatic disease in the abdomen pelvis. 4. Stable small subpleural nodules in the RIGHT lower lobe. 5. Stable pleural mass in the LEFT lower lobe may represent a benign pleural tumor. Electronically Signed   By: Suzy Bouchard M.D.   On: 06/04/2020 12:16      ASSESSMENT & PLAN:  1. Pancreatic cancer metastasized to liver ([place])   2. Anemia due to antineoplastic chemotherapy   3. Neuropathy   4. Encounter for antineoplastic chemotherapy    #Metastatic pancreatic cancer- liver mets, hilar nodal metastasis, ?bone mets, CA 19-9 peaked at [address], then decreased to 30699 2 weeks ago.  Patient is on fourth line palliative chemotherapy with FOLFOX. Labs are reviewed and discussed with patient. Counts acceptable to proceed with cycle 3 FOLFOX today. Consider  follow-up with CT scan after 4-6 cycles for evaluation of treatment response.  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  Symptoms are stable.  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  Stable.  #Normocytic anemia, hemoglobin is decreasing due to the chemotherapy.  Continue close monitor. #Right knee swelling, patient has a history of knee effusion.  Recommend patient to follow-up with orthopedic surgeon.  #Recommend flu vaccination-patient prefers to get a flu shot at the cancer center.  We will proceed..Also discussed with him about the recommendation of [place] 19 vaccination booster #All questions were answered. The patient knows to call the clinic with any problems questions or concerns. Follow-up 2 weeks for evaluation prior to next cycle of treatment.   Earlie Server, MD, PhD Hematology Oncology [place] at [facility] 07/22/2020

## 2020-07-22 NOTE — Progress Notes (Signed)
Right knee started swelling again 3 nights ago and is still slightly swollen with soreness that feels like a toothache.  Reports has good and bad days with his appetite.

## 2020-07-24 ENCOUNTER — Inpatient Hospital Stay: Payer: Medicare Other

## 2020-07-24 ENCOUNTER — Other Ambulatory Visit: Payer: Self-pay

## 2020-07-24 VITALS — BP 163/70 | HR 88 | Temp 97.0°F

## 2020-07-24 DIAGNOSIS — Z5111 Encounter for antineoplastic chemotherapy: Secondary | ICD-10-CM | POA: Diagnosis not present

## 2020-07-24 DIAGNOSIS — C259 Malignant neoplasm of pancreas, unspecified: Secondary | ICD-10-CM

## 2020-07-24 DIAGNOSIS — C787 Secondary malignant neoplasm of liver and intrahepatic bile duct: Secondary | ICD-10-CM

## 2020-07-24 MED ORDER — HEPARIN SOD (PORK) LOCK FLUSH 100 UNIT/ML IV SOLN
500.0000 [IU] | Freq: Once | INTRAVENOUS | Status: AC | PRN
  Administered 2020-07-24: 500 [IU]
  Filled 2020-07-24: qty 5

## 2020-07-24 MED ORDER — SODIUM CHLORIDE 0.9% FLUSH
10.0000 mL | INTRAVENOUS | Status: DC | PRN
  Administered 2020-07-24: 10 mL
  Filled 2020-07-24: qty 10

## 2020-07-30 ENCOUNTER — Other Ambulatory Visit
Admission: RE | Admit: 2020-07-30 | Discharge: 2020-07-30 | Disposition: A | Discharge status: Home / Self Care | Payer: Medicare Other | Source: Ambulatory Visit | Attending: Sports Medicine | Admitting: Sports Medicine

## 2020-07-30 DIAGNOSIS — G8929 Other chronic pain: Secondary | ICD-10-CM | POA: Diagnosis present

## 2020-07-30 DIAGNOSIS — M25461 Effusion, right knee: Secondary | ICD-10-CM | POA: Insufficient documentation

## 2020-07-30 DIAGNOSIS — M25561 Pain in right knee: Secondary | ICD-10-CM | POA: Diagnosis not present

## 2020-07-30 LAB — SYNOVIAL CELL COUNT + DIFF, W/ CRYSTALS
Lymphocytes-Synovial Fld: 0 %
Monocyte-Macrophage-Synovial Fluid: 5 %
Neutrophil, Synovial: 95 %
WBC, Synovial: 6608 /mm3 — ABNORMAL HIGH (ref 0–200)

## 2020-08-05 ENCOUNTER — Other Ambulatory Visit: Payer: Self-pay

## 2020-08-05 ENCOUNTER — Inpatient Hospital Stay (HOSPITAL_BASED_OUTPATIENT_CLINIC_OR_DEPARTMENT_OTHER): Payer: Medicare Other | Admitting: Oncology

## 2020-08-05 ENCOUNTER — Encounter: Payer: Self-pay | Admitting: Oncology

## 2020-08-05 ENCOUNTER — Inpatient Hospital Stay: Payer: Medicare Other

## 2020-08-05 VITALS — BP 121/74 | HR 92 | Temp 99.0°F | Resp 18 | Wt 163.3 lb

## 2020-08-05 DIAGNOSIS — Z5111 Encounter for antineoplastic chemotherapy: Secondary | ICD-10-CM | POA: Diagnosis not present

## 2020-08-05 DIAGNOSIS — T451X5A Adverse effect of antineoplastic and immunosuppressive drugs, initial encounter: Secondary | ICD-10-CM

## 2020-08-05 DIAGNOSIS — D6481 Anemia due to antineoplastic chemotherapy: Secondary | ICD-10-CM | POA: Diagnosis not present

## 2020-08-05 DIAGNOSIS — C259 Malignant neoplasm of pancreas, unspecified: Secondary | ICD-10-CM

## 2020-08-05 DIAGNOSIS — G629 Polyneuropathy, unspecified: Secondary | ICD-10-CM | POA: Diagnosis not present

## 2020-08-05 DIAGNOSIS — C787 Secondary malignant neoplasm of liver and intrahepatic bile duct: Secondary | ICD-10-CM

## 2020-08-05 LAB — COMPREHENSIVE METABOLIC PANEL
ALT: 66 U/L — ABNORMAL HIGH (ref 0–44)
AST: 41 U/L (ref 15–41)
Albumin: 3.5 g/dL (ref 3.5–5.0)
Alkaline Phosphatase: 114 U/L (ref 38–126)
Anion gap: 9 (ref 5–15)
BUN: 18 mg/dL (ref 8–23)
CO2: 23 mmol/L (ref 22–32)
Calcium: 8.9 mg/dL (ref 8.9–10.3)
Chloride: 102 mmol/L (ref 98–111)
Creatinine, Ser: 0.79 mg/dL (ref 0.61–1.24)
GFR, Estimated: >60 mL/min (ref >60)
Glucose, Bld: 119 mg/dL — ABNORMAL HIGH (ref 70–99)
Potassium: 4.2 mmol/L (ref 3.5–5.1)
Sodium: 134 mmol/L — ABNORMAL LOW (ref 135–145)
Total Bilirubin: 1 mg/dL (ref 0.3–1.2)
Total Protein: 7 g/dL (ref 6.5–8.1)

## 2020-08-05 LAB — CBC WITH DIFFERENTIAL/PLATELET
Abs Immature Granulocytes: 0.08 10*3/uL — ABNORMAL HIGH (ref 0.00–0.07)
Basophils Absolute: 0 10*3/uL (ref 0.0–0.1)
Basophils Relative: 0 %
Eosinophils Absolute: 0.1 10*3/uL (ref 0.0–0.5)
Eosinophils Relative: 2 %
HCT: 33.3 % — ABNORMAL LOW (ref 39.0–52.0)
Hemoglobin: 10.6 g/dL — ABNORMAL LOW (ref 13.0–17.0)
Immature Granulocytes: 1 %
Lymphocytes Relative: 13 %
Lymphs Abs: 1 10*3/uL (ref 0.7–4.0)
MCH: 29.4 pg (ref 26.0–34.0)
MCHC: 31.8 g/dL (ref 30.0–36.0)
MCV: 92.5 fL (ref 80.0–100.0)
Monocytes Absolute: 1.3 10*3/uL — ABNORMAL HIGH (ref 0.1–1.0)
Monocytes Relative: 16 %
Neutro Abs: 5.7 10*3/uL (ref 1.7–7.7)
Neutrophils Relative %: 68 %
Platelets: 257 10*3/uL (ref 150–400)
RBC: 3.6 MIL/uL — ABNORMAL LOW (ref 4.22–5.81)
RDW: 17.6 % — ABNORMAL HIGH (ref 11.5–15.5)
WBC: 8.2 10*3/uL (ref 4.0–10.5)
nRBC: 0 % (ref 0.0–0.2)

## 2020-08-05 MED ORDER — DEXTROSE 5 % IV SOLN
Freq: Once | INTRAVENOUS | Status: AC
  Filled 2020-08-05: qty 250

## 2020-08-05 MED ORDER — SODIUM CHLORIDE 0.9 % IV SOLN
2400.0000 mg/m2 | INTRAVENOUS | Status: DC
  Administered 2020-08-05: 4700 mg via INTRAVENOUS
  Filled 2020-08-05: qty 94

## 2020-08-05 MED ORDER — PALONOSETRON HCL INJECTION 0.25 MG/5ML
0.2500 mg | Freq: Once | INTRAVENOUS | Status: AC
  Administered 2020-08-05: 0.25 mg via INTRAVENOUS
  Filled 2020-08-05: qty 5

## 2020-08-05 MED ORDER — LEUCOVORIN CALCIUM INJECTION 350 MG
410.0000 mg/m2 | Freq: Once | INTRAVENOUS | Status: AC
  Administered 2020-08-05: 800 mg via INTRAVENOUS
  Filled 2020-08-05: qty 40

## 2020-08-05 MED ORDER — SODIUM CHLORIDE 0.9% FLUSH
10.0000 mL | INTRAVENOUS | Status: DC | PRN
  Administered 2020-08-05: 10 mL
  Filled 2020-08-05: qty 10

## 2020-08-05 MED ORDER — OXALIPLATIN CHEMO INJECTION 100 MG/20ML
85.0000 mg/m2 | Freq: Once | INTRAVENOUS | Status: AC
  Administered 2020-08-05: 165 mg via INTRAVENOUS
  Filled 2020-08-05: qty 33

## 2020-08-05 MED ORDER — SODIUM CHLORIDE 0.9 % IV SOLN
10.0000 mg | Freq: Once | INTRAVENOUS | Status: AC
  Administered 2020-08-05: 10 mg via INTRAVENOUS
  Filled 2020-08-05: qty 10

## 2020-08-05 NOTE — Progress Notes (Signed)
Pt here for follow up. Pt reports increased tiredness.

## 2020-08-05 NOTE — Progress Notes (Signed)
Hematology/Oncology  Follow up note [facility] Telephone:([phone]) [phone] [phone]   Patient Care Team: Paulene Floor as PCP - General (Physician Assistant) Clent Jacks, RN as Oncology Nurse Navigator Earlie Server, MD as Consulting Physician (Hematology and Oncology) Neldon Labella, RN as Case Manager Lonia Farber, MD as Consulting Physician (Internal Medicine)  REFERRING PROVIDER: Trinna Post, PA-C  CHIEF COMPLAINTS/REASON FOR VISIT:  Follow up for treatment of pancreatic cancer  HISTORY OF PRESENTING ILLNESS:   Craig [name] is a  74 y.o.  male with PMH listed below, including CABG x5, PVD, hypertension, former tobacco abuse, former alcohol abuse, iron deficiency anemia, was seen in consultation at the request of  Terrilee Croak, Adriana M, PA-C  for evaluation of abnormal CT Patient recently presented to primary care provider complaining for abdominal pain radiating to his back for about 10 days.  Patient uses Tylenol as needed for pain. Work-up showed elevated amylase, lipase, mild anemia with hemoglobin of 11.3 Acute hepatitis panel negative. 05/16/2019 CT abdomen pelvis with contrast showed poorly marginated heterogeneous hypodense 2.9 cm pancreatic mass at the uncinate process, worrisome for primary pancreatic cancer.  No biliary or pancreatic duct dilation. Several ill defined hypodense liver masses scattered throughout the liver, largest 3.1 cm in the segment 6 right liver lobe. Nonspecific portacaval and aortocaval adenopathy. Extreme medial left lung base 4.4 cm pleural-based mass probably a pleural metastasis.  Additional tiny pulmonary nodules scattered at the right lung base are indeterminate. Mild prostate or megaly  He had a history of 37.5-pack-year smoking history quitting 2018. No longer drinking alcohol.  # ultrasound-guided liver biopsy on 05/21/2019.  Pathology showed fragments of benign hepatic parenchyma showing  minimal macro vascular steatosis, otherwise no significant histo pathologic change. Single core of renal tissue showing approximately 25% glomerulosclerosis  # #History of CAD, status post CABG x5.  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  # Omniseq test showed PD-L1 50% TPS, TMB high, negative for BRCA1/2 or NTRK fusion.  MSI could not be completed.  Patient declines genetic testing # 06/07/2019 -1st line chemotherapy Gemcitabine and abraxane. 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 # 11/14/2019- progression as well as profound weekend fatigue, 2nd line treatment with keytruda # 01/07/2020 -progression, start third line chemotherapy treatment 5-FU with liposomal irinotecan 01/24/2020-01/26/2020 patient was admitted to ICU due to DKA. on long-acting insulin 20 units at night and takes sliding scale with meals.  INTERVAL HISTORY Craig [name] is a 74 y.o. male who has above history reviewed by me today presents for evaluation of chemotherapy tolerability for metastatic pancreatic cancer.   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  No numbness or tingling in his fingertips. Chronic fatigue. He experiences episodes of dizziness when he changes his position suddenly, i.e. from squatting to standing position or sitting to standing.  No nausea vomiting diarrhea.  Today he denies any dizziness or lightheaded He has had a right knee fluid drained during the interval.  Review of Systems  Constitutional: Positive for fatigue and unexpected weight change. Negative for appetite change, chills and fever.  HENT:   Negative for hearing loss and voice change.   Eyes: Negative for eye problems and icterus.  Respiratory: Negative for chest tightness, cough and shortness of breath.   Cardiovascular: Negative  for chest pain and  leg swelling.  Gastrointestinal: Negative for abdominal distention and abdominal pain.  Endocrine: Negative for hot flashes.  Genitourinary: Negative for difficulty urinating, dysuria and frequency.   Musculoskeletal: Negative for arthralgias.  Skin: Negative for itching and rash.  Neurological: Positive for numbness. Negative for light-headedness.  Hematological: Negative for adenopathy. Does not bruise/bleed easily.  Psychiatric/Behavioral: Negative for confusion.    MEDICAL HISTORY:  Past Medical History:  Diagnosis Date  . Allergic rhinitis   . CHF (congestive heart failure) ([place])   . Chronic cholecystitis   . Coronary artery disease   . DDD (degenerative disc disease), cervical   . Dehydration 06/21/2019  . Diabetes mellitus without complication ([place])   . Dyspnea   . Early cataract   . Erectile dysfunction   . GERD (gastroesophageal reflux disease)   . Gouty arthritis   . Headache    occasional migraines  . Hypercalcemia   . Hyperlipemia   . Hypertension   . Palpitations   . Pancreatic cancer metastasized to liver ([place]) 05/2019   Chemo tx's  . Peripheral vascular disease ([place])   . 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   LIMA to LAD SVG to [place] SVG SEQUENTIALLY to OM1 and OM2 SVG to ACUTE MARGINAL  . Spinal stenosis of cervical region   . Vitamin D deficiency     SURGICAL HISTORY: Past Surgical History:  Procedure Laterality Date  . BACK SURGERY  2018    fusion with screws  . CHOLECYSTECTOMY N/A 11/13/2018   Procedure: LAPAROSCOPIC CHOLECYSTECTOMY;  Surgeon: Vickie Epley, MD;  Location: [facility];  Service: General;  Laterality: N/A;  . COLONOSCOPY    . CORONARY ARTERY BYPASS GRAFT N/A 09/30/2017   Procedure: CORONARY ARTERY BYPASS GRAFTING times five using right and left Saphaneous vein harvested endoscopicly  and left internal mammary artery. (CABG),TEE;  Surgeon: Rexene Alberts, MD;  Location: [facility];  Service: Open Heart Surgery;   Laterality: N/A;  . LEFT HEART CATH AND CORONARY ANGIOGRAPHY Left 09/06/2017   Procedure: LEFT HEART CATH AND CORONARY ANGIOGRAPHY;  Surgeon: Corey Skains, MD;  Location: [facility];  Service: Cardiovascular;  Laterality: Left;  . percutaneous transluminal balloon angioplasty  01/2010   of left lower extremity  . PORTA CATH INSERTION N/A 06/06/2019   Procedure: PORTA CATH INSERTION;  Surgeon: Katha Cabal, MD;  Location: [facility] [facility];  Service: Cardiovascular;  Laterality: N/A;  . TEE WITHOUT CARDIOVERSION N/A 09/30/2017   Procedure: TRANSESOPHAGEAL ECHOCARDIOGRAM (TEE);  Surgeon: Rexene Alberts, MD;  Location: [facility];  Service: Open Heart Surgery;  Laterality: N/A;  . VASCULAR SURGERY Left 2010   left exernal iliac and superficial femoral artery PTCA and stenting    SOCIAL HISTORY: Social History   Socioeconomic History  . Marital status: Married    Spouse name: Enid Derry  . Number of children: 2  . Years of education: Not on file  . Highest education level: 5th grade  Occupational History  . Occupation: drove tractors    Comment: retired  Tobacco Use  . Smoking status: Former Smoker    Packs/day: 0.75    Years: 50.00    Pack years: 37.50    Types: Cigarettes    Quit date: 09/27/2017    Years since quitting: 2.8  . Smokeless tobacco: Former Systems developer    Types: Secondary school teacher  . Vaping Use: Never used  Substance and Sexual Activity  . Alcohol use: Not Currently    Comment: stopped drinking  before cabg  . Drug use: Not Currently    Types: Marijuana    Comment: stopped smoking before CABG  . Sexual activity: Not Currently  Other Topics Concern  . Not on file  Social History Narrative  . Not on file   Social Determinants of Health   Financial Resource Strain: Low Risk   . Difficulty of Paying Living Expenses: Not hard at all  Food Insecurity: No Food Insecurity  . Worried About Charity fundraiser in the Last Year: Never true  . Ran Out of  Food in the Last Year: Never true  Transportation Needs: No Transportation Needs  . Lack of Transportation (Medical): No  . Lack of Transportation (Non-Medical): No  Physical Activity: Inactive  . Days of Exercise per Week: 0 days  . Minutes of Exercise per Session: 0 min  Stress: No Stress Concern Present  . Feeling of Stress : Not at all  Social Connections: Moderately Isolated  . Frequency of Communication with Friends and Family: More than three times a week  . Frequency of Social Gatherings with Friends and Family: More than three times a week  . Attends Religious Services: Never  . Active Member of Clubs or Organizations: No  . Attends Archivist Meetings: Never  . Marital Status: Married  Human resources officer Violence: Not At Risk  . Fear of Current or Ex-Partner: No  . Emotionally Abused: No  . Physically Abused: No  . Sexually Abused: No    FAMILY HISTORY: Family History  Problem Relation Age of Onset  . Brain cancer Mother   . Emphysema Father   . Cancer Brother     ALLERGIES:  is allergic to lipitor [atorvastatin], zetia [ezetimibe], lisinopril, protonix [pantoprazole], and spironolactone.  MEDICATIONS:  Current Outpatient Medications  Medication Sig Dispense Refill  . aspirin EC 81 MG tablet Take 81 mg by mouth daily.    . carvedilol (COREG) 6.25 MG tablet Take 6.25 mg by mouth 2 (two) times daily with a meal.  3  . Continuous Blood Gluc Receiver (FREESTYLE LIBRE 14 DAY READER) DEVI 1 Device by Does not apply route every 14 (fourteen) days. 6 each 1  . diltiazem (CARDIZEM CD) 300 MG 24 hr capsule TAKE 1 CAPSULE BY MOUTH EVERY DAY 90 capsule 0  . docusate sodium (COLACE) 100 MG capsule Take 1 capsule (100 mg total) by mouth daily. 30 capsule 0  . dorzolamide-timolol (COSOPT) 22.3-6.8 MG/ML ophthalmic solution INSTILL ONE DROP INTO BOTH EYES TWICE DAILY    . DULoxetine (CYMBALTA) 30 MG capsule TAKE 2 CAPSULES BY MOUTH EVERY DAY 180 capsule 1  . ferrous  sulfate 325 (65 FE) MG tablet TAKE 1 TABLET BY MOUTH EVERY DAY WITH BREAKFAST 90 tablet 1  . ibuprofen (ADVIL) 800 MG tablet Take 1 tablet (800 mg total) by mouth every 8 (eight) hours as needed. 30 tablet 0  . insulin glargine (LANTUS SOLOSTAR) 100 UNIT/ML Solostar Pen Inject 20 Units into the skin at bedtime. (Patient taking differently: Inject 16 Units into the skin at bedtime. ) 15 mL 0  . insulin lispro (HUMALOG) 100 UNIT/ML KwikPen Inject 0.05 mLs (5 Units total) into the skin 3 (three) times daily with meals. 15 mL 11  . Insulin Pen Needle (PEN NEEDLES) 32G X 5 MM MISC Use four times daily for insulin 400 each 1  . latanoprost (XALATAN) 0.005 % ophthalmic solution Place 1 drop into both eyes at bedtime.   3  . lidocaine-prilocaine (EMLA) cream APPLY TO  AFFECTED AREA ONCE AS DIRECTED    . Multiple Vitamin (MULTIVITAMIN WITH MINERALS) TABS tablet Take 1 tablet by mouth daily.     [name] Kitchen omeprazole (PRILOSEC) 40 MG capsule TAKE 1 CAPSULE BY MOUTH EVERY DAY 90 capsule 1  . ONETOUCH VERIO test strip TEST FASTING SUGAR EVERY MORNING 100 strip 3  . rosuvastatin (CRESTOR) 10 MG tablet Take 1 tablet by mouth daily.    [name] Kitchen triamcinolone ointment (KENALOG) 0.5 % APPLY 1 APPLICATION TOPICALLY 3 (THREE) TIMES DAILY. 30 g 0  . OMEGA-3 FATTY ACIDS PO Take 1,200 mg by mouth daily.  (Patient not taking: Reported on 07/08/2020)    . oxyCODONE (OXY IR/ROXICODONE) 5 MG immediate release tablet Take 1 tablet (5 mg total) by mouth every 6 (six) hours as needed for severe pain. (Patient not taking: Reported on 04/15/2020) 30 tablet 0  . potassium chloride (KLOR-CON) 10 MEQ tablet TAKE 1 TABLET BY MOUTH EVERY DAY (Patient not taking: Reported on 07/22/2020) 30 tablet 2   No current facility-administered medications for this visit.   Facility-Administered Medications Ordered in Other Visits  Medication Dose Route Frequency Provider Last Rate Last Admin  . fluorouracil (ADRUCIL) 4,700 mg in sodium chloride 0.9 % 56 mL chemo  infusion  2,400 mg/m2 (Treatment Plan Recorded) Intravenous 1 day or 1 dose Earlie Server, MD      . leucovorin 800 mg in dextrose 5 % 250 mL infusion  410 mg/m2 (Treatment Plan Recorded) Intravenous Once Earlie Server, MD 145 mL/hr at 08/05/20 1042 800 mg at 08/05/20 1042  . oxaliplatin (ELOXATIN) 165 mg in dextrose 5 % 500 mL chemo infusion  85 mg/m2 (Treatment Plan Recorded) Intravenous Once Earlie Server, MD 267 mL/hr at 08/05/20 1041 165 mg at 08/05/20 1041  . sodium chloride flush (NS) 0.9 % injection 10 mL  10 mL Intracatheter PRN Earlie Server, MD      . sodium chloride flush (NS) 0.9 % injection 10 mL  10 mL Intravenous Once Earlie Server, MD      . sodium chloride flush (NS) 0.9 % injection 10 mL  10 mL Intracatheter PRN Earlie Server, MD         PHYSICAL EXAMINATION: ECOG PERFORMANCE STATUS: 1 - Symptomatic but completely ambulatory Vitals:   08/05/20 0857  BP: 121/74  Pulse: 92  Resp: 18  Temp: 99 F (37.2 C)   Filed Weights   08/05/20 0857  Weight: 163 lb 4.8 oz (74.1 kg)    Physical Exam Constitutional:      General: He is not in acute distress. HENT:     Head: Normocephalic and atraumatic.  Eyes:     General: No scleral icterus. Cardiovascular:     Rate and Rhythm: Normal rate and regular rhythm.     Heart sounds: Normal heart sounds.  Pulmonary:     Effort: Pulmonary effort is normal. No respiratory distress.     Breath sounds: No wheezing.  Abdominal:     General: Bowel sounds are normal. There is no distension.     Palpations: Abdomen is soft.  Musculoskeletal:        General: No deformity. Normal range of motion.     Cervical back: Normal range of motion and neck supple.  Skin:    General: Skin is warm and dry.     Findings: No erythema or rash.  Neurological:     Mental Status: He is alert and oriented to person, place, and time. Mental status is at baseline.  Cranial Nerves: No cranial nerve deficit.     Coordination: Coordination normal.  Psychiatric:        Mood and  Affect: Mood normal.     LABORATORY DATA:  I have reviewed the data as listed Lab Results  Component Value Date   WBC 8.2 08/05/2020   HGB 10.6 (L) 08/05/2020   HCT 33.3 (L) 08/05/2020   MCV 92.5 08/05/2020   PLT 257 08/05/2020   Recent Labs    06/18/20 0926 06/18/20 0926 06/24/20 0826 06/24/20 0826 07/08/20 0841 07/22/20 0831 08/05/20 0835  NA 137   < > 139   < > 139 139 134*  K 4.0   < > 4.4   < > 3.8 3.6 4.2  CL 104   < > 103   < > 107 105 102  CO2 22   < > 25   < > $R'23 24 23  '[place]$ GLUCOSE 134*   < > 149*   < > 161* 151* 119*  BUN 16   < > 21   < > $R'17 19 18  '[place]$ CREATININE 1.06   < > 0.98   < > 1.05 1.13 0.79  CALCIUM 8.8*   < > 9.2   < > 8.8* 8.8* 8.9  GFRNONAA >60   < > >60   < > >60 >60 >60  GFRAA >60  --  >60  --  >60  --   --   PROT 6.6   < > 6.9   < > 6.5 6.8 7.0  ALBUMIN 3.5   < > 3.6   < > 3.4* 3.4* 3.5  AST 36   < > 48*   < > 54* 47* 41  ALT 76*   < > 90*   < > 97* 74* 66*  ALKPHOS 84   < > 83   < > 89 85 114  BILITOT 0.5   < > 0.6   < > 0.6 0.7 1.0   < > = values in this interval not displayed.   Iron/TIBC/Ferritin/ %Sat    Component Value Date/Time   IRON 119 04/27/2019 0940   TIBC 363 04/27/2019 0940   FERRITIN 58 04/27/2019 0940   IRONPCTSAT 33 04/27/2019 0940      RADIOGRAPHIC STUDIES: I have personally reviewed the radiological images as listed and agreed with the findings in the report. CT CHEST ABDOMEN PELVIS W CONTRAST  Result Date: 06/04/2020 CLINICAL DATA:  Pancreatic cancer.  On going chemotherapy EXAM: CT CHEST, ABDOMEN, AND PELVIS WITH CONTRAST TECHNIQUE: Multidetector CT imaging of the chest, abdomen and pelvis was performed following the standard protocol during bolus administration of intravenous contrast. CONTRAST:  123mL OMNIPAQUE IOHEXOL 300 MG/ML  SOLN COMPARISON:  CT 04/24/2019 FINDINGS: CT CHEST FINDINGS Cardiovascular: Port in the anterior chest wall with tip in distal SVC. Post CABG Mediastinum/Nodes: No axillary or supraclavicular  adenopathy. No mediastinal or hilar adenopathy. No pericardial fluid. Esophagus normal. Lungs/Pleura: Several small subpleural nodules in the RIGHT lower lobe are unchanged. Largest nodule measures 3 mm on image 91/4. Again demonstrated lesion along the medial LEFT pleural surface adjacent to the descending thoracic aorta measuring 4.2 cm and unchanged prior (image 46/2). Musculoskeletal: No aggressive osseous lesion. CT ABDOMEN AND PELVIS FINDINGS Hepatobiliary: Multiple poorly marginated hypoenhancing lesions are present within the LEFT RIGHT hepatic lobe. Visually these lesions appear increased in size compared to prior. For example lesion the posterior RIGHT hepatic lobe measuring approximately 3.4 cm in diameter (image 47/2) compares to 2.1  cm. Lesion in the anterior aspect RIGHT hepatic lobe measuring 2.7 cm (image 47) compares with 1.9 cm. Small lesion in the caudate lobe measuring 1.1 cm compares to 0.5 cm. Postcholecystectomy Pancreas: Fullness in the head of the pancreas measuring 3.0 x 2.7 cm compares with 3.2 x 2.2 cm for some decrease in size. Atrophy of body and tail the pancreas. Spleen: Normal spleen Adrenals/urinary tract: Adrenal glands and kidneys are normal. The ureters and bladder normal. Stomach/Bowel: Stomach, small bowel, appendix, and cecum are normal. The colon and rectosigmoid colon are normal. Vascular/Lymphatic: Abdominal aorta is normal caliber. There is no retroperitoneal or periportal lymphadenopathy. No pelvic lymphadenopathy. Reproductive: Prostate normal Other: No free fluid. Musculoskeletal: No aggressive osseous lesion. IMPRESSION: 1. Moderate increase in size of multifocal hepatic metastasis. 2. Mass in the head of the pancreas measures slightly smaller. 3. No evidence of new metastatic disease in the abdomen pelvis. 4. Stable small subpleural nodules in the RIGHT lower lobe. 5. Stable pleural mass in the LEFT lower lobe may represent a benign pleural tumor. Electronically  Signed   By: Suzy Bouchard M.D.   On: 06/04/2020 12:16      ASSESSMENT & PLAN:  1. Pancreatic cancer metastasized to liver ([place])   2. Anemia due to antineoplastic chemotherapy   3. Neuropathy   4. Encounter for antineoplastic chemotherapy    #Metastatic pancreatic cancer- liver mets, hilar nodal metastasis, ?bone mets, [place] 19-9 peaked at 317[address], then decreased to 30699 2 weeks ago.  Patient is on fourth line palliative chemotherapy with FOLFOX. Labs reviewed and discussed with patient Today CA 19-9 is pending Counts acceptable to proceed with cycle 5 FOLFOX today Consider follow-up CT scan after 6 cycles of treatments.  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  His symptoms are slightly worse.  We discussed about the likelihood of worsening of neuropathy with additional chemotherapy.  He feels that currently he is still be able to live with it.  Continue close monitor.  #Chronic hypokalemia, potassium is 4.2  #Normocytic anemia, hemoglobin is decreasing due to the chemotherapy.  Continue to monitor. #Right knee swelling, follows up with orthopedic surgeon.  He recently had another aspiration and drainage of fluid.[name] Kitchen   #All questions were answered. The patient knows to call the clinic with any problems questions or concerns. Follow-up 2 weeks for evaluation prior to next cycle of treatment.   Earlie Server, MD, PhD Hematology Oncology [place] at [facility] 08/05/2020

## 2020-08-06 LAB — CANCER ANTIGEN 19-9: CA 19-9: 53461 U/mL — ABNORMAL HIGH (ref 0–35)

## 2020-08-07 ENCOUNTER — Other Ambulatory Visit: Payer: Self-pay

## 2020-08-07 ENCOUNTER — Inpatient Hospital Stay: Payer: Medicare Other

## 2020-08-07 DIAGNOSIS — C259 Malignant neoplasm of pancreas, unspecified: Secondary | ICD-10-CM

## 2020-08-07 DIAGNOSIS — Z5111 Encounter for antineoplastic chemotherapy: Secondary | ICD-10-CM | POA: Diagnosis not present

## 2020-08-07 MED ORDER — SODIUM CHLORIDE 0.9% FLUSH
10.0000 mL | INTRAVENOUS | Status: DC | PRN
  Administered 2020-08-07: 10 mL
  Filled 2020-08-07: qty 10

## 2020-08-07 MED ORDER — HEPARIN SOD (PORK) LOCK FLUSH 100 UNIT/ML IV SOLN
500.0000 [IU] | Freq: Once | INTRAVENOUS | Status: AC | PRN
  Administered 2020-08-07: 500 [IU]
  Filled 2020-08-07: qty 5

## 2020-08-19 ENCOUNTER — Inpatient Hospital Stay (HOSPITAL_BASED_OUTPATIENT_CLINIC_OR_DEPARTMENT_OTHER): Payer: Medicare Other | Admitting: Oncology

## 2020-08-19 ENCOUNTER — Inpatient Hospital Stay (HOSPITAL_BASED_OUTPATIENT_CLINIC_OR_DEPARTMENT_OTHER): Payer: Medicare Other | Admitting: Licensed Clinical Social Worker

## 2020-08-19 ENCOUNTER — Encounter: Payer: Self-pay | Admitting: Licensed Clinical Social Worker

## 2020-08-19 ENCOUNTER — Inpatient Hospital Stay: Payer: Medicare Other

## 2020-08-19 ENCOUNTER — Encounter: Payer: Self-pay | Admitting: Oncology

## 2020-08-19 ENCOUNTER — Inpatient Hospital Stay: Payer: Medicare Other | Attending: Oncology

## 2020-08-19 VITALS — BP 100/66 | HR 101 | Temp 97.9°F | Resp 18 | Wt 161.3 lb

## 2020-08-19 DIAGNOSIS — I2699 Other pulmonary embolism without acute cor pulmonale: Secondary | ICD-10-CM | POA: Insufficient documentation

## 2020-08-19 DIAGNOSIS — R918 Other nonspecific abnormal finding of lung field: Secondary | ICD-10-CM | POA: Diagnosis not present

## 2020-08-19 DIAGNOSIS — R160 Hepatomegaly, not elsewhere classified: Secondary | ICD-10-CM | POA: Diagnosis not present

## 2020-08-19 DIAGNOSIS — R5383 Other fatigue: Secondary | ICD-10-CM | POA: Insufficient documentation

## 2020-08-19 DIAGNOSIS — G620 Drug-induced polyneuropathy: Secondary | ICD-10-CM | POA: Insufficient documentation

## 2020-08-19 DIAGNOSIS — Z808 Family history of malignant neoplasm of other organs or systems: Secondary | ICD-10-CM | POA: Diagnosis not present

## 2020-08-19 DIAGNOSIS — I509 Heart failure, unspecified: Secondary | ICD-10-CM | POA: Diagnosis not present

## 2020-08-19 DIAGNOSIS — M50321 Other cervical disc degeneration at C4-C5 level: Secondary | ICD-10-CM | POA: Insufficient documentation

## 2020-08-19 DIAGNOSIS — Z5111 Encounter for antineoplastic chemotherapy: Secondary | ICD-10-CM | POA: Diagnosis not present

## 2020-08-19 DIAGNOSIS — N281 Cyst of kidney, acquired: Secondary | ICD-10-CM | POA: Insufficient documentation

## 2020-08-19 DIAGNOSIS — I700 Atherosclerosis of aorta: Secondary | ICD-10-CM | POA: Insufficient documentation

## 2020-08-19 DIAGNOSIS — E876 Hypokalemia: Secondary | ICD-10-CM | POA: Diagnosis not present

## 2020-08-19 DIAGNOSIS — C787 Secondary malignant neoplasm of liver and intrahepatic bile duct: Secondary | ICD-10-CM | POA: Diagnosis not present

## 2020-08-19 DIAGNOSIS — Z7189 Other specified counseling: Secondary | ICD-10-CM | POA: Diagnosis not present

## 2020-08-19 DIAGNOSIS — R9082 White matter disease, unspecified: Secondary | ICD-10-CM | POA: Insufficient documentation

## 2020-08-19 DIAGNOSIS — I639 Cerebral infarction, unspecified: Secondary | ICD-10-CM | POA: Insufficient documentation

## 2020-08-19 DIAGNOSIS — E860 Dehydration: Secondary | ICD-10-CM | POA: Insufficient documentation

## 2020-08-19 DIAGNOSIS — D6481 Anemia due to antineoplastic chemotherapy: Secondary | ICD-10-CM | POA: Diagnosis not present

## 2020-08-19 DIAGNOSIS — C7951 Secondary malignant neoplasm of bone: Secondary | ICD-10-CM | POA: Diagnosis not present

## 2020-08-19 DIAGNOSIS — K219 Gastro-esophageal reflux disease without esophagitis: Secondary | ICD-10-CM | POA: Diagnosis not present

## 2020-08-19 DIAGNOSIS — I739 Peripheral vascular disease, unspecified: Secondary | ICD-10-CM | POA: Insufficient documentation

## 2020-08-19 DIAGNOSIS — R200 Anesthesia of skin: Secondary | ICD-10-CM | POA: Insufficient documentation

## 2020-08-19 DIAGNOSIS — I2510 Atherosclerotic heart disease of native coronary artery without angina pectoris: Secondary | ICD-10-CM | POA: Insufficient documentation

## 2020-08-19 DIAGNOSIS — Z809 Family history of malignant neoplasm, unspecified: Secondary | ICD-10-CM | POA: Diagnosis not present

## 2020-08-19 DIAGNOSIS — Z836 Family history of other diseases of the respiratory system: Secondary | ICD-10-CM | POA: Insufficient documentation

## 2020-08-19 DIAGNOSIS — E611 Iron deficiency: Secondary | ICD-10-CM | POA: Insufficient documentation

## 2020-08-19 DIAGNOSIS — Z79899 Other long term (current) drug therapy: Secondary | ICD-10-CM | POA: Insufficient documentation

## 2020-08-19 DIAGNOSIS — E119 Type 2 diabetes mellitus without complications: Secondary | ICD-10-CM | POA: Diagnosis not present

## 2020-08-19 DIAGNOSIS — Z87891 Personal history of nicotine dependence: Secondary | ICD-10-CM | POA: Insufficient documentation

## 2020-08-19 DIAGNOSIS — R000 Tachycardia, unspecified: Secondary | ICD-10-CM | POA: Insufficient documentation

## 2020-08-19 DIAGNOSIS — I672 Cerebral atherosclerosis: Secondary | ICD-10-CM | POA: Insufficient documentation

## 2020-08-19 DIAGNOSIS — Z86711 Personal history of pulmonary embolism: Secondary | ICD-10-CM | POA: Insufficient documentation

## 2020-08-19 DIAGNOSIS — C259 Malignant neoplasm of pancreas, unspecified: Secondary | ICD-10-CM

## 2020-08-19 DIAGNOSIS — T451X5A Adverse effect of antineoplastic and immunosuppressive drugs, initial encounter: Secondary | ICD-10-CM | POA: Diagnosis not present

## 2020-08-19 DIAGNOSIS — Z9049 Acquired absence of other specified parts of digestive tract: Secondary | ICD-10-CM | POA: Insufficient documentation

## 2020-08-19 DIAGNOSIS — Z9221 Personal history of antineoplastic chemotherapy: Secondary | ICD-10-CM | POA: Insufficient documentation

## 2020-08-19 DIAGNOSIS — Z888 Allergy status to other drugs, medicaments and biological substances status: Secondary | ICD-10-CM | POA: Insufficient documentation

## 2020-08-19 DIAGNOSIS — I110 Hypertensive heart disease with heart failure: Secondary | ICD-10-CM | POA: Diagnosis not present

## 2020-08-19 DIAGNOSIS — Z7401 Bed confinement status: Secondary | ICD-10-CM | POA: Insufficient documentation

## 2020-08-19 DIAGNOSIS — I6502 Occlusion and stenosis of left vertebral artery: Secondary | ICD-10-CM | POA: Insufficient documentation

## 2020-08-19 DIAGNOSIS — Z7901 Long term (current) use of anticoagulants: Secondary | ICD-10-CM | POA: Insufficient documentation

## 2020-08-19 DIAGNOSIS — Z794 Long term (current) use of insulin: Secondary | ICD-10-CM | POA: Insufficient documentation

## 2020-08-19 LAB — COMPREHENSIVE METABOLIC PANEL
ALT: 30 U/L (ref 0–44)
AST: 42 U/L — ABNORMAL HIGH (ref 15–41)
Albumin: 3.1 g/dL — ABNORMAL LOW (ref 3.5–5.0)
Alkaline Phosphatase: 122 U/L (ref 38–126)
Anion gap: 9 (ref 5–15)
BUN: 19 mg/dL (ref 8–23)
CO2: 24 mmol/L (ref 22–32)
Calcium: 9 mg/dL (ref 8.9–10.3)
Chloride: 106 mmol/L (ref 98–111)
Creatinine, Ser: 1.04 mg/dL (ref 0.61–1.24)
GFR, Estimated: >60 mL/min (ref >60)
Glucose, Bld: 115 mg/dL — ABNORMAL HIGH (ref 70–99)
Potassium: 4.4 mmol/L (ref 3.5–5.1)
Sodium: 139 mmol/L (ref 135–145)
Total Bilirubin: 0.7 mg/dL (ref 0.3–1.2)
Total Protein: 6.8 g/dL (ref 6.5–8.1)

## 2020-08-19 LAB — CBC WITH DIFFERENTIAL/PLATELET
Abs Immature Granulocytes: 0.03 10*3/uL (ref 0.00–0.07)
Basophils Absolute: 0 10*3/uL (ref 0.0–0.1)
Basophils Relative: 0 %
Eosinophils Absolute: 0.1 10*3/uL (ref 0.0–0.5)
Eosinophils Relative: 2 %
HCT: 31 % — ABNORMAL LOW (ref 39.0–52.0)
Hemoglobin: 9.8 g/dL — ABNORMAL LOW (ref 13.0–17.0)
Immature Granulocytes: 1 %
Lymphocytes Relative: 12 %
Lymphs Abs: 0.8 10*3/uL (ref 0.7–4.0)
MCH: 29.8 pg (ref 26.0–34.0)
MCHC: 31.6 g/dL (ref 30.0–36.0)
MCV: 94.2 fL (ref 80.0–100.0)
Monocytes Absolute: 1 10*3/uL (ref 0.1–1.0)
Monocytes Relative: 17 %
Neutro Abs: 4.3 10*3/uL (ref 1.7–7.7)
Neutrophils Relative %: 68 %
Platelets: 203 10*3/uL (ref 150–400)
RBC: 3.29 MIL/uL — ABNORMAL LOW (ref 4.22–5.81)
RDW: 17.9 % — ABNORMAL HIGH (ref 11.5–15.5)
WBC: 6.2 10*3/uL (ref 4.0–10.5)
nRBC: 0 % (ref 0.0–0.2)

## 2020-08-19 MED ORDER — DEXTROSE 5 % IV SOLN
Freq: Once | INTRAVENOUS | Status: AC
  Filled 2020-08-19: qty 250

## 2020-08-19 MED ORDER — SODIUM CHLORIDE 0.9 % IV SOLN
2400.0000 mg/m2 | INTRAVENOUS | Status: DC
  Administered 2020-08-19: 4700 mg via INTRAVENOUS
  Filled 2020-08-19: qty 94

## 2020-08-19 MED ORDER — LEUCOVORIN CALCIUM INJECTION 350 MG: 410.0000 mg/m2 | Freq: Once | INTRAVENOUS | Status: DC

## 2020-08-19 MED ORDER — OXALIPLATIN CHEMO INJECTION 100 MG/20ML
65.0000 mg/m2 | Freq: Once | INTRAVENOUS | Status: AC
  Administered 2020-08-19: 125 mg via INTRAVENOUS
  Filled 2020-08-19: qty 10

## 2020-08-19 MED ORDER — SODIUM CHLORIDE 0.9 % IV SOLN
10.0000 mg | Freq: Once | INTRAVENOUS | Status: AC
  Administered 2020-08-19: 10 mg via INTRAVENOUS
  Filled 2020-08-19: qty 10

## 2020-08-19 MED ORDER — LEUCOVORIN CALCIUM INJECTION 100 MG
20.0000 mg/m2 | Freq: Once | INTRAMUSCULAR | Status: AC
  Administered 2020-08-19: 38 mg via INTRAVENOUS
  Filled 2020-08-19: qty 1.9

## 2020-08-19 MED ORDER — SODIUM CHLORIDE 0.9% FLUSH
10.0000 mL | INTRAVENOUS | Status: DC | PRN
  Administered 2020-08-19: 10 mL via INTRAVENOUS
  Filled 2020-08-19: qty 10

## 2020-08-19 MED ORDER — PALONOSETRON HCL INJECTION 0.25 MG/5ML
0.2500 mg | Freq: Once | INTRAVENOUS | Status: AC
  Administered 2020-08-19: 0.25 mg via INTRAVENOUS
  Filled 2020-08-19: qty 5

## 2020-08-19 NOTE — Progress Notes (Addendum)
Hematology/Oncology  Follow up note [facility] [phone] [phone]   Patient Care Team: Paulene Floor as PCP - General (Physician Assistant) Clent Jacks, RN as Oncology Nurse Navigator Earlie Server, MD as Consulting Physician (Hematology and Oncology) Neldon Labella, RN as Case Manager Lonia Farber, MD as Consulting Physician (Internal Medicine)  REFERRING PROVIDER: Trinna Post, PA-C  CHIEF COMPLAINTS/REASON FOR VISIT:  Follow up for treatment of pancreatic cancer  HISTORY OF PRESENTING ILLNESS:   Craig [name] is a  74 y.o.  male with PMH listed below, including CABG x5, PVD, hypertension, former tobacco abuse, former alcohol abuse, iron deficiency anemia, was seen in consultation at the request of  Terrilee Croak, Adriana M, PA-C  for evaluation of abnormal CT Patient recently presented to primary care provider complaining for abdominal pain radiating to his back for about 10 days.  Patient uses Tylenol as needed for pain. Work-up showed elevated amylase, lipase, mild anemia with hemoglobin of 11.3 Acute hepatitis panel negative. 05/16/2019 CT abdomen pelvis with contrast showed poorly marginated heterogeneous hypodense 2.9 cm pancreatic mass at the uncinate process, worrisome for primary pancreatic cancer.  No biliary or pancreatic duct dilation. Several ill defined hypodense liver masses scattered throughout the liver, largest 3.1 cm in the segment 6 right liver lobe. Nonspecific portacaval and aortocaval adenopathy. Extreme medial left lung base 4.4 cm pleural-based mass probably a pleural metastasis.  Additional tiny pulmonary nodules scattered at the right lung base are indeterminate. Mild prostate or megaly  He had a history of 37.5-pack-year smoking history quitting 2018. No longer drinking alcohol.  # ultrasound-guided liver biopsy on 05/21/2019.  Pathology showed fragments of benign hepatic parenchyma showing  minimal macro vascular steatosis, otherwise no significant histo pathologic change. Single core of renal tissue showing approximately 25% glomerulosclerosis  # #History of CAD, status post CABG x5.  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  # Omniseq test showed PD-L1 50% TPS, TMB high, negative for BRCA1/2 or NTRK fusion.  MSI could not be completed.  Patient declines genetic testing # 06/07/2019 -1st line chemotherapy Gemcitabine and abraxane. 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 # 11/14/2019- progression as well as profound weekend fatigue, 2nd line treatment with keytruda # 01/07/2020 -progression, start third line chemotherapy treatment 5-FU with liposomal irinotecan 01/24/2020-01/26/2020 patient was admitted to ICU due to DKA. on long-acting insulin 20 units at night and takes sliding scale with meals.  INTERVAL HISTORY Craig [name] is a 74 y.o. male who has above history reviewed by me today presents for evaluation of chemotherapy tolerability for metastatic pancreatic cancer.   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   Intermittent numbness or tingling in his fingertips. He feels more tired.  Epigastric discomfort he describes as" something is there".  Appetite has decreased.  No nausea vomiting diarrhea.  He has lost 2 pounds since last visit Review of Systems  Constitutional: Positive for fatigue and unexpected weight change. Negative for appetite change, chills and fever.  HENT:   Negative for hearing loss and voice change.   Eyes: Negative for eye problems and icterus.  Respiratory: Negative for chest tightness, cough and shortness of breath.   Cardiovascular: Negative for chest pain and leg swelling.  Gastrointestinal: Negative for abdominal distention and abdominal  pain.  Endocrine: Negative for  hot flashes.  Genitourinary: Negative for difficulty urinating, dysuria and frequency.   Musculoskeletal: Negative for arthralgias.  Skin: Negative for itching and rash.  Neurological: Positive for numbness. Negative for light-headedness.  Hematological: Negative for adenopathy. Does not bruise/bleed easily.  Psychiatric/Behavioral: Negative for confusion.    MEDICAL HISTORY:  Past Medical History:  Diagnosis Date  . Allergic rhinitis   . CHF (congestive heart failure) ([place])   . Chronic cholecystitis   . Coronary artery disease   . DDD (degenerative disc disease), cervical   . Dehydration 06/21/2019  . Diabetes mellitus without complication ([place])   . Dyspnea   . Early cataract   . Erectile dysfunction   . Family history of brain cancer   . Family history of cancer   . GERD (gastroesophageal reflux disease)   . Gouty arthritis   . Headache    occasional migraines  . Hypercalcemia   . Hyperlipemia   . Hypertension   . Palpitations   . Pancreatic cancer metastasized to liver ([place]) 05/2019   Chemo tx's  . Peripheral vascular disease ([place])   . 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   LIMA to LAD SVG to [place] SVG SEQUENTIALLY to OM1 and OM2 SVG to ACUTE MARGINAL  . Spinal stenosis of cervical region   . Vitamin D deficiency     SURGICAL HISTORY: Past Surgical History:  Procedure Laterality Date  . BACK SURGERY  2018    fusion with screws  . CHOLECYSTECTOMY N/A 11/13/2018   Procedure: LAPAROSCOPIC CHOLECYSTECTOMY;  Surgeon: Vickie Epley, MD;  Location: [facility];  Service: General;  Laterality: N/A;  . COLONOSCOPY    . CORONARY ARTERY BYPASS GRAFT N/A 09/30/2017   Procedure: CORONARY ARTERY BYPASS GRAFTING times five using right and left Saphaneous vein harvested endoscopicly  and left internal mammary artery. (CABG),TEE;  Surgeon: Rexene Alberts, MD;  Location: [place];  Service: Open Heart Surgery;  Laterality: N/A;  . LEFT HEART CATH AND  CORONARY ANGIOGRAPHY Left 09/06/2017   Procedure: LEFT HEART CATH AND CORONARY ANGIOGRAPHY;  Surgeon: Corey Skains, MD;  Location: [place] [facility];  Service: Cardiovascular;  Laterality: Left;  . percutaneous transluminal balloon angioplasty  01/2010   of left lower extremity  . PORTA CATH INSERTION N/A 06/06/2019   Procedure: PORTA CATH INSERTION;  Surgeon: Katha Cabal, MD;  Location: [facility];  Service: Cardiovascular;  Laterality: N/A;  . TEE WITHOUT CARDIOVERSION N/A 09/30/2017   Procedure: TRANSESOPHAGEAL ECHOCARDIOGRAM (TEE);  Surgeon: Rexene Alberts, MD;  Location: [place];  Service: Open Heart Surgery;  Laterality: N/A;  . VASCULAR SURGERY Left 2010   left exernal iliac and superficial femoral artery PTCA and stenting    SOCIAL HISTORY: Social History   Socioeconomic History  . Marital status: Married    Spouse name: Enid Derry  . Number of children: 2  . Years of education: Not on file  . Highest education level: 5th grade  Occupational History  . Occupation: drove tractors    Comment: retired  Tobacco Use  . Smoking status: Former Smoker    Packs/day: 0.75    Years: 50.00    Pack years: 37.50    Types: Cigarettes    Quit date: 09/27/2017    Years since quitting: 2.8  . Smokeless tobacco: Former Systems developer    Types: Secondary school teacher  . Vaping Use: Never used  Substance and Sexual Activity  . Alcohol use: Not Currently    Comment: stopped drinking  before cabg  . Drug use: Not Currently    Types: Marijuana    Comment: stopped smoking before CABG  . Sexual activity: Not Currently  Other Topics Concern  . Not on file  Social History Narrative  . Not on file   Social Determinants of Health   Financial Resource Strain: Low Risk   . Difficulty of Paying Living Expenses: Not hard at all  Food Insecurity: No Food Insecurity  . Worried About Charity fundraiser in the Last Year: Never true  . Ran Out of Food in the Last Year: Never true   Transportation Needs: No Transportation Needs  . Lack of Transportation (Medical): No  . Lack of Transportation (Non-Medical): No  Physical Activity: Inactive  . Days of Exercise per Week: 0 days  . Minutes of Exercise per Session: 0 min  Stress: No Stress Concern Present  . Feeling of Stress : Not at all  Social Connections: Moderately Isolated  . Frequency of Communication with Friends and Family: More than three times a week  . Frequency of Social Gatherings with Friends and Family: More than three times a week  . Attends Religious Services: Never  . Active Member of Clubs or Organizations: No  . Attends Archivist Meetings: Never  . Marital Status: Married  Human resources officer Violence: Not At Risk  . Fear of Current or Ex-Partner: No  . Emotionally Abused: No  . Physically Abused: No  . Sexually Abused: No    FAMILY HISTORY: Family History  Problem Relation Age of Onset  . Brain cancer Mother   . Emphysema Father   . Cancer Brother     ALLERGIES:  is allergic to lipitor [atorvastatin], zetia [ezetimibe], lisinopril, protonix [pantoprazole], and spironolactone.  MEDICATIONS:  Current Outpatient Medications  Medication Sig Dispense Refill  . aspirin EC 81 MG tablet Take 81 mg by mouth daily.    . carvedilol (COREG) 6.25 MG tablet Take 6.25 mg by mouth 2 (two) times daily with a meal.  3  . Continuous Blood Gluc Receiver (FREESTYLE LIBRE 14 DAY READER) DEVI 1 Device by Does not apply route every 14 (fourteen) days. 6 each 1  . diltiazem (CARDIZEM CD) 300 MG 24 hr capsule TAKE 1 CAPSULE BY MOUTH EVERY DAY 90 capsule 0  . dorzolamide-timolol (COSOPT) 22.3-6.8 MG/ML ophthalmic solution INSTILL ONE DROP INTO BOTH EYES TWICE DAILY    . DULoxetine (CYMBALTA) 30 MG capsule TAKE 2 CAPSULES BY MOUTH EVERY DAY 180 capsule 1  . ferrous sulfate 325 (65 FE) MG tablet TAKE 1 TABLET BY MOUTH EVERY DAY WITH BREAKFAST 90 tablet 1  . ibuprofen (ADVIL) 800 MG tablet Take 1 tablet  (800 mg total) by mouth every 8 (eight) hours as needed. 30 tablet 0  . insulin glargine (LANTUS SOLOSTAR) 100 UNIT/ML Solostar Pen Inject 20 Units into the skin at bedtime. (Patient taking differently: Inject 16 Units into the skin at bedtime. ) 15 mL 0  . insulin lispro (HUMALOG) 100 UNIT/ML KwikPen Inject 0.05 mLs (5 Units total) into the skin 3 (three) times daily with meals. 15 mL 11  . Insulin Pen Needle (PEN NEEDLES) 32G X 5 MM MISC Use four times daily for insulin 400 each 1  . latanoprost (XALATAN) 0.005 % ophthalmic solution Place 1 drop into both eyes at bedtime.   3  . lidocaine-prilocaine (EMLA) cream APPLY TO AFFECTED AREA ONCE AS DIRECTED    . Multiple Vitamin (MULTIVITAMIN WITH MINERALS) TABS tablet Take 1 tablet by  mouth daily.     [name] Kitchen omeprazole (PRILOSEC) 40 MG capsule TAKE 1 CAPSULE BY MOUTH EVERY DAY 90 capsule 1  . ONETOUCH VERIO test strip TEST FASTING SUGAR EVERY MORNING 100 strip 3  . rosuvastatin (CRESTOR) 10 MG tablet Take 1 tablet by mouth daily.    [name] Kitchen triamcinolone ointment (KENALOG) 0.5 % APPLY 1 APPLICATION TOPICALLY 3 (THREE) TIMES DAILY. 30 g 0  . docusate sodium (COLACE) 100 MG capsule Take 1 capsule (100 mg total) by mouth daily. (Patient not taking: Reported on 08/19/2020) 30 capsule 0  . OMEGA-3 FATTY ACIDS PO Take 1,200 mg by mouth daily.  (Patient not taking: Reported on 07/08/2020)    . oxyCODONE (OXY IR/ROXICODONE) 5 MG immediate release tablet Take 1 tablet (5 mg total) by mouth every 6 (six) hours as needed for severe pain. (Patient not taking: Reported on 04/15/2020) 30 tablet 0  . potassium chloride (KLOR-CON) 10 MEQ tablet TAKE 1 TABLET BY MOUTH EVERY DAY (Patient not taking: Reported on 07/22/2020) 30 tablet 2   No current facility-administered medications for this visit.   Facility-Administered Medications Ordered in Other Visits  Medication Dose Route Frequency Provider Last Rate Last Admin  . fluorouracil (ADRUCIL) 4,700 mg in sodium chloride 0.9 % 56 mL  chemo infusion  2,400 mg/m2 (Treatment Plan Recorded) Intravenous 1 day or 1 dose Earlie Server, MD   4,700 mg at 08/19/20 1240  . sodium chloride flush (NS) 0.9 % injection 10 mL  10 mL Intracatheter PRN Earlie Server, MD      . sodium chloride flush (NS) 0.9 % injection 10 mL  10 mL Intravenous Once Earlie Server, MD      . sodium chloride flush (NS) 0.9 % injection 10 mL  10 mL Intravenous PRN Earlie Server, MD   10 mL at 08/19/20 0838     PHYSICAL EXAMINATION: ECOG PERFORMANCE STATUS: 1 - Symptomatic but completely ambulatory Vitals:   08/19/20 0849  BP: 100/66  Pulse: (!) 101  Resp: 18  Temp: 97.9 F (36.6 C)   Filed Weights   08/19/20 0849  Weight: 161 lb 4.8 oz (73.2 kg)    Physical Exam Constitutional:      General: He is not in acute distress. HENT:     Head: Normocephalic and atraumatic.  Eyes:     General: No scleral icterus. Cardiovascular:     Rate and Rhythm: Normal rate and regular rhythm.     Heart sounds: Normal heart sounds.  Pulmonary:     Effort: Pulmonary effort is normal. No respiratory distress.     Breath sounds: No wheezing.  Abdominal:     General: Bowel sounds are normal. There is no distension.     Palpations: Abdomen is soft.  Musculoskeletal:        General: No deformity. Normal range of motion.     Cervical back: Normal range of motion and neck supple.  Skin:    General: Skin is warm and dry.     Findings: No erythema or rash.  Neurological:     Mental Status: He is alert and oriented to person, place, and time. Mental status is at baseline.     Cranial Nerves: No cranial nerve deficit.     Coordination: Coordination normal.  Psychiatric:        Mood and Affect: Mood normal.     LABORATORY DATA:  I have reviewed the data as listed Lab Results  Component Value Date   WBC 6.2 08/19/2020   HGB  9.8 (L) 08/19/2020   HCT 31.0 (L) 08/19/2020   MCV 94.2 08/19/2020   PLT 203 08/19/2020   Recent Labs    06/18/20 0926 06/18/20 0926 06/24/20 0826  06/24/20 0826 07/08/20 0841 07/08/20 0841 07/22/20 0831 08/05/20 0835 08/19/20 0827  NA 137   < > 139   < > 139   < > 139 134* 139  K 4.0   < > 4.4   < > 3.8   < > 3.6 4.2 4.4  CL 104   < > 103   < > 107   < > 105 102 106  CO2 22   < > 25   < > 23   < > $R'24 23 24  'DB$ GLUCOSE 134*   < > 149*   < > 161*   < > 151* 119* 115*  BUN 16   < > 21   < > 17   < > $R'19 18 19  'Cj$ CREATININE 1.06   < > 0.98   < > 1.05   < > 1.13 0.79 1.04  CALCIUM 8.8*   < > 9.2   < > 8.8*   < > 8.8* 8.9 9.0  GFRNONAA >60   < > >60   < > >60  --  >60 >60 >60  GFRAA >60  --  >60  --  >60  --   --   --   --   PROT 6.6   < > 6.9   < > 6.5   < > 6.8 7.0 6.8  ALBUMIN 3.5   < > 3.6   < > 3.4*   < > 3.4* 3.5 3.1*  AST 36   < > 48*   < > 54*   < > 47* 41 42*  ALT 76*   < > 90*   < > 97*   < > 74* 66* 30  ALKPHOS 84   < > 83   < > 89   < > 85 114 122  BILITOT 0.5   < > 0.6   < > 0.6   < > 0.7 1.0 0.7   < > = values in this interval not displayed.   Iron/TIBC/Ferritin/ %Sat    Component Value Date/Time   IRON 119 04/27/2019 0940   TIBC 363 04/27/2019 0940   FERRITIN 58 04/27/2019 0940   IRONPCTSAT 33 04/27/2019 0940      RADIOGRAPHIC STUDIES: I have personally reviewed the radiological images as listed and agreed with the findings in the report. CT CHEST ABDOMEN PELVIS W CONTRAST  Result Date: 06/04/2020 CLINICAL DATA:  Pancreatic cancer.  On going chemotherapy EXAM: CT CHEST, ABDOMEN, AND PELVIS WITH CONTRAST TECHNIQUE: Multidetector CT imaging of the chest, abdomen and pelvis was performed following the standard protocol during bolus administration of intravenous contrast. CONTRAST:  12mL OMNIPAQUE IOHEXOL 300 MG/ML  SOLN COMPARISON:  CT 04/24/2019 FINDINGS: CT CHEST FINDINGS Cardiovascular: Port in the anterior chest wall with tip in distal SVC. Post CABG Mediastinum/Nodes: No axillary or supraclavicular adenopathy. No mediastinal or hilar adenopathy. No pericardial fluid. Esophagus normal. Lungs/Pleura: Several small  subpleural nodules in the RIGHT lower lobe are unchanged. Largest nodule measures 3 mm on image 91/4. Again demonstrated lesion along the medial LEFT pleural surface adjacent to the descending thoracic aorta measuring 4.2 cm and unchanged prior (image 46/2). Musculoskeletal: No aggressive osseous lesion. CT ABDOMEN AND PELVIS FINDINGS Hepatobiliary: Multiple poorly marginated hypoenhancing lesions are present within the LEFT RIGHT hepatic lobe.  Visually these lesions appear increased in size compared to prior. For example lesion the posterior RIGHT hepatic lobe measuring approximately 3.4 cm in diameter (image 47/2) compares to 2.1 cm. Lesion in the anterior aspect RIGHT hepatic lobe measuring 2.7 cm (image 47) compares with 1.9 cm. Small lesion in the caudate lobe measuring 1.1 cm compares to 0.5 cm. Postcholecystectomy Pancreas: Fullness in the head of the pancreas measuring 3.0 x 2.7 cm compares with 3.2 x 2.2 cm for some decrease in size. Atrophy of body and tail the pancreas. Spleen: Normal spleen Adrenals/urinary tract: Adrenal glands and kidneys are normal. The ureters and bladder normal. Stomach/Bowel: Stomach, small bowel, appendix, and cecum are normal. The colon and rectosigmoid colon are normal. Vascular/Lymphatic: Abdominal aorta is normal caliber. There is no retroperitoneal or periportal lymphadenopathy. No pelvic lymphadenopathy. Reproductive: Prostate normal Other: No free fluid. Musculoskeletal: No aggressive osseous lesion. IMPRESSION: 1. Moderate increase in size of multifocal hepatic metastasis. 2. Mass in the head of the pancreas measures slightly smaller. 3. No evidence of new metastatic disease in the abdomen pelvis. 4. Stable small subpleural nodules in the RIGHT lower lobe. 5. Stable pleural mass in the LEFT lower lobe may represent a benign pleural tumor. Electronically Signed   By: Suzy Bouchard M.D.   On: 06/04/2020 12:16      ASSESSMENT & PLAN:  1. Pancreatic cancer  metastasized to liver ([place])   2. Anemia due to antineoplastic chemotherapy   3. Encounter for antineoplastic chemotherapy   4. Goals of care, counseling/discussion    #Metastatic pancreatic cancer- liver mets, hilar nodal metastasis, ?bone mets, CA 19-9 peaked at 31781, then decreased to 30699 2 weeks ago.  Patient is on fourth line palliative chemotherapy with FOLFOX. Labs are reviewed and discussed with patient. Proceed with follow up CT scan for evaluation of treatment response.  CA 19-9 continues to trend up.  He understands that current chemotherapy may not be working as the CA 19-9 continues to trend up.  He understands that there will be limited options left if so.  Continue follow-up with palliative care service.  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  His symptoms are slightly worse.  We discussed about the likelihood of worsening of neuropathy with additional chemotherapy.   Decrease oxaliplatin to 65 mg/m.   #Chronic hypokalemia, potassium is 4.4.  Stable.  #Normocytic anemia, hemoglobin is decreasing due to the chemotherapy.  Continue to monitor.  #All questions were answered. The patient knows to call the clinic with any problems questions or concerns. Follow-up 2 weeks for evaluation prior to next cycle of treatment.   Earlie Server, MD, PhD Hematology Oncology [place] at [facility] 08/19/2020  09/02/2020 CT scan was reviewed.  New bilateral PE, called patient and advise him to go to ER.  Liver mets have progressed. He has progressed through 3-4 lines of chemotherapy.  Depending on his performance status and desire to proceed with additional chemotherapy. I Will further discuss with him. Options will be switch to GTX regimen vs comfort care/hospice. For now he want to proceed. Will look into his coverage for GTX. He sees me on 09/08/20  Earlie Server

## 2020-08-19 NOTE — Progress Notes (Signed)
REFERRING PROVIDER: Earlie Server, MD [place],  [place] [address]  PRIMARY PROVIDER:  Trinna Post, PA-C  PRIMARY REASON FOR VISIT:  1. Pancreatic cancer metastasized to liver ([facility])   2. Family history of brain cancer   3. Family history of cancer      HISTORY OF PRESENT ILLNESS:   [name], a 74 y.o. male, was seen for a [facility] cancer genetics consultation at the request of Dr. Tasia Catchings due to a personal history of pancreatic cancer.  Craig [name] presents to clinic today to discuss the possibility of a hereditary predisposition to cancer, genetic testing, and to further clarify his future cancer risks, as well as potential cancer risks for family members.   In 2020, at the age of 20, Craig [name] was diagnosed with pancreatic cancer. This is currently being treated with chemotherapy.  CANCER HISTORY:  Oncology History  Pancreatic cancer metastasized to liver ([facility])  06/01/2019 Initial Diagnosis   Pancreatic cancer metastasized to liver ([facility])   06/07/2019 - 10/31/2019 Chemotherapy   The patient had PACLitaxel-protein bound (ABRAXANE) chemo infusion 250 mg, 125 mg/m2 = 250 mg (100 % of original dose 125 mg/m2), Intravenous,  Once, 4 of 4 cycles Dose modification: 100 mg/m2 (original dose 125 mg/m2, Cycle 1, Reason: Provider Judgment), 125 mg/m2 (original dose 125 mg/m2, Cycle 1, Reason: Provider Judgment), 100 mg/m2 (original dose 125 mg/m2, Cycle 2, Reason: Dose not tolerated, Comment: neuropathy), 90 mg/m2 (original dose 125 mg/m2, Cycle 3, Reason: Dose not tolerated, Comment: neuropathy), 100 mg/m2 (original dose 125 mg/m2, Cycle 3, Reason: Dose not tolerated) Administration: 250 mg (06/07/2019), 250 mg (06/14/2019), 250 mg (06/21/2019), 200 mg (07/05/2019), 200 mg (07/12/2019), 200 mg (07/26/2019), 200 mg (08/02/2019), 200 mg (08/16/2019), 200 mg (08/31/2019) gemcitabine (GEMZAR) 2,000 mg in sodium chloride 0.9 % 250 mL chemo infusion, 2,090 mg, Intravenous,  Once, 7 of 8  cycles Dose modification: 800 mg/m2 (original dose 1,000 mg/m2, Cycle 5, Reason: Dose not tolerated), 1,000 mg/m2 (original dose 1,000 mg/m2, Cycle 5, Reason: Provider Judgment) Administration: 2,000 mg (06/07/2019), 2,000 mg (09/14/2019), 2,000 mg (09/21/2019), 2,000 mg (10/03/2019), 2,000 mg (10/10/2019), 2,000 mg (06/14/2019), 2,000 mg (07/05/2019), 1,600 mg (07/12/2019), 1,600 mg (07/26/2019), 1,600 mg (08/02/2019), 1,600 mg (08/16/2019), 1,600 mg (08/31/2019), 2,000 mg (10/24/2019), 2,000 mg (10/31/2019)  for chemotherapy treatment.    11/19/2019 - 12/31/2019 Chemotherapy   The patient had pembrolizumab (KEYTRUDA) 200 mg in sodium chloride 0.9 % 50 mL chemo infusion, 200 mg, Intravenous, Once, 3 of 6 cycles Administration: 200 mg (11/19/2019), 200 mg (12/31/2019), 200 mg (12/10/2019)  for chemotherapy treatment.    01/07/2020 - 05/29/2020 Chemotherapy   The patient had dexamethasone (DECADRON) 4 MG tablet, 8 mg, Oral, Daily, 1 of 1 cycle, Start date: 01/02/2020, End date: 01/07/2020 palonosetron (ALOXI) injection 0.25 mg, 0.25 mg, Intravenous,  Once, 11 of 12 cycles Administration: 0.25 mg (01/07/2020), 0.25 mg (01/21/2020), 0.25 mg (02/04/2020), 0.25 mg (02/18/2020), 0.25 mg (03/03/2020), 0.25 mg (03/17/2020), 0.25 mg (03/31/2020), 0.25 mg (04/15/2020), 0.25 mg (04/29/2020), 0.25 mg (05/13/2020), 0.25 mg (05/27/2020) fluorouracil (ADRUCIL) 4,550 mg in sodium chloride 0.9 % 59 mL chemo infusion, 2,400 mg/m2 = 4,550 mg, Intravenous, 1 Day/Dose, 11 of 12 cycles Dose modification: 800 mg/m2/day (original dose 800 mg/m2/day, Cycle 6) Administration: 4,550 mg (01/07/2020), 4,550 mg (01/21/2020), 4,550 mg (02/04/2020), 4,550 mg (02/18/2020), 4,550 mg (03/03/2020), 4,550 mg (03/17/2020), 4,550 mg (03/31/2020), 4,550 mg (04/15/2020), 4,550 mg (04/29/2020), 1,500 mg (03/19/2020), 4,550 mg (05/13/2020), 4,550 mg (05/27/2020) irinotecan LIPOSOME (ONIVYDE) 96 mg  in sodium chloride 0.9 % 500 mL chemo infusion, 94.6 mg (100 % of original dose 50 mg/m2),  Intravenous, Once, 11 of 12 cycles Dose modification: 50 mg/m2 (original dose 50 mg/m2, Cycle 1, Reason: Provider Judgment) Administration: 96 mg (01/07/2020), 129 mg (01/21/2020), 129 mg (02/04/2020), 129 mg (02/18/2020), 129 mg (03/03/2020), 129 mg (03/17/2020), 129 mg (03/31/2020), 129 mg (04/15/2020), 129 mg (04/29/2020), 129 mg (05/13/2020), 129 mg (05/27/2020) leucovorin 800 mg in sodium chloride 0.9 % 250 mL infusion, 760 mg, Intravenous,  Once, 11 of 12 cycles Administration: 800 mg (01/07/2020), 800 mg (01/21/2020), 800 mg (02/04/2020), 800 mg (02/18/2020), 800 mg (03/03/2020), 800 mg (03/17/2020), 800 mg (03/31/2020), 800 mg (04/15/2020), 800 mg (04/29/2020), 800 mg (05/13/2020), 800 mg (05/27/2020)  for chemotherapy treatment.    06/10/2020 -  Chemotherapy   The patient had palonosetron (ALOXI) injection 0.25 mg, 0.25 mg, Intravenous,  Once, 6 of 7 cycles Administration: 0.25 mg (06/10/2020), 0.25 mg (06/24/2020), 0.25 mg (07/08/2020), 0.25 mg (07/22/2020), 0.25 mg (08/05/2020) leucovorin 800 mg in dextrose 5 % 250 mL infusion, 410 mg/m2 = 780 mg, Intravenous,  Once, 6 of 7 cycles Administration: 800 mg (06/10/2020), 800 mg (06/24/2020), 800 mg (07/08/2020), 800 mg (07/22/2020), 800 mg (08/05/2020) oxaliplatin (ELOXATIN) 165 mg in dextrose 5 % 500 mL chemo infusion, 85 mg/m2 = 165 mg, Intravenous,  Once, 6 of 7 cycles Dose modification: 65 mg/m2 (original dose 85 mg/m2, Cycle 6, Reason: Other (see comments), Comment: neuropathy) Administration: 165 mg (06/10/2020), 165 mg (06/24/2020), 165 mg (07/08/2020), 165 mg (07/22/2020), 165 mg (08/05/2020) fluorouracil (ADRUCIL) 4,700 mg in sodium chloride 0.9 % 56 mL chemo infusion, 2,400 mg/m2 = 4,700 mg, Intravenous, 1 Day/Dose, 6 of 7 cycles Administration: 4,700 mg (06/10/2020), 4,700 mg (06/24/2020), 4,700 mg (07/08/2020), 4,700 mg (07/22/2020), 4,700 mg (08/05/2020)  for chemotherapy treatment.      Past Medical History:  Diagnosis Date  . Allergic rhinitis   . CHF  (congestive heart failure) (HCC)   . Chronic cholecystitis   . Coronary artery disease   . DDD (degenerative disc disease), cervical   . Dehydration 06/21/2019  . Diabetes mellitus without complication (HCC)   . Dyspnea   . Early cataract   . Erectile dysfunction   . Family history of brain cancer   . Family history of cancer   . GERD (gastroesophageal reflux disease)   . Gouty arthritis   . Headache    occasional migraines  . Hypercalcemia   . Hyperlipemia   . Hypertension   . Palpitations   . Pancreatic cancer metastasized to liver ([facility]) 05/2019   Chemo tx's  . Peripheral vascular disease (HCC)   . 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 Spinal stenosis of cervical region   . Vitamin D deficiency     Past Surgical History:  Procedure Laterality Date  . BACK SURGERY  2018    fusion with screws  . CHOLECYSTECTOMY N/A 11/13/2018   Procedure: LAPAROSCOPIC CHOLECYSTECTOMY;  Surgeon: Ancil Linsey, MD;  Location: [facility] ORS;  Service: General;  Laterality: N/A;  . COLONOSCOPY    . CORONARY ARTERY BYPASS GRAFT N/A 09/30/2017   Procedure: CORONARY ARTERY BYPASS GRAFTING times five using right and left Saphaneous vein harvested endoscopicly  and left internal mammary artery. (CABG),TEE;  Surgeon: Purcell Nails, MD;  Location: [facility] OR;  Service: Open Heart Surgery;  Laterality: N/A;  . LEFT HEART CATH  AND CORONARY ANGIOGRAPHY Left 09/06/2017   Procedure: LEFT HEART CATH AND CORONARY ANGIOGRAPHY;  Surgeon: Corey Skains, MD;  Location: [facility];  Service: Cardiovascular;  Laterality: Left;  . percutaneous transluminal balloon angioplasty  01/2010   of left lower extremity  . PORTA CATH INSERTION N/A 06/06/2019   Procedure: PORTA CATH INSERTION;  Surgeon: Katha Cabal, MD;  Location: [facility] LAB;  Service: Cardiovascular;  Laterality: N/A;  . TEE WITHOUT CARDIOVERSION N/A  09/30/2017   Procedure: TRANSESOPHAGEAL ECHOCARDIOGRAM (TEE);  Surgeon: Rexene Alberts, MD;  Location: [facility];  Service: Open Heart Surgery;  Laterality: N/A;  . VASCULAR SURGERY Left 2010   left exernal iliac and superficial femoral artery PTCA and stenting    Social History   Socioeconomic History  . Marital status: Married    Spouse name: Enid Derry  . Number of children: 2  . Years of education: Not on file  . Highest education level: 5th grade  Occupational History  . Occupation: drove tractors    Comment: retired  Tobacco Use  . Smoking status: Former Smoker    Packs/day: 0.75    Years: 50.00    Pack years: 37.50    Types: Cigarettes    Quit date: 09/27/2017    Years since quitting: 2.8  . Smokeless tobacco: Former Systems developer    Types: Secondary school teacher  . Vaping Use: Never used  Substance and Sexual Activity  . Alcohol use: Not Currently    Comment: stopped drinking before cabg  . Drug use: Not Currently    Types: Marijuana    Comment: stopped smoking before CABG  . Sexual activity: Not Currently  Other Topics Concern  . Not on file  Social History Narrative  . Not on file   Social Determinants of Health   Financial Resource Strain: Low Risk   . Difficulty of Paying Living Expenses: Not hard at all  Food Insecurity: No Food Insecurity  . Worried About Charity fundraiser in the Last Year: Never true  . Ran Out of Food in the Last Year: Never true  Transportation Needs: No Transportation Needs  . Lack of Transportation (Medical): No  . Lack of Transportation (Non-Medical): No  Physical Activity: Inactive  . Days of Exercise per Week: 0 days  . Minutes of Exercise per Session: 0 min  Stress: No Stress Concern Present  . Feeling of Stress : Not at all  Social Connections: Moderately Isolated  . Frequency of Communication with Friends and Family: More than three times a week  . Frequency of Social Gatherings with Friends and Family: More than three times a week  .  Attends Religious Services: Never  . Active Member of Clubs or Organizations: No  . Attends Archivist Meetings: Never  . Marital Status: Married     FAMILY HISTORY:  We obtained a detailed, 4-generation family history.  Significant diagnoses are listed below: Family History  Problem Relation Age of Onset  . Brain cancer Mother   . Emphysema Father   . Cancer Brother    Craig [name] has a son and a daughter that he has no contact with. He had 5 brothers and 4 sisters, one brother passed of cancer but is unsure the type.  Craig [name] mother had a brain tumor, she passed over the age of 31. Patient unaware of maternal aunts/uncles who had cancer, but does believe cousins on this side of the family had cancer, unknown types. Maternal  grandmother passed at 2, grandfather passed over 47.  Craig [name] father died in his 67s-80s, he has no information about this side of the family.  Craig [name] is unaware of previous family history of genetic testing for hereditary cancer risks. There is no reported Ashkenazi Jewish ancestry. There is no known consanguinity.   GENETIC COUNSELING ASSESSMENT: Craig [name] is a 74 y.o. male with a personal history of pancreatic cancer which is somewhat suggestive of a hereditary cancer syndrome and predisposition to cancer. We, therefore, discussed and recommended the following at today's visit.   DISCUSSION: We discussed that approximately 5-10% of pancreatic cancer is hereditary  Most cases of hereditary pancreatic cancer are associated with BRCA1/BRCA2 genes, although there are other genes associated with hereditary pancreatic cancer as well. We discussed that testing is beneficial for several reasons including knowing if an individual is a candidate for certain targeted therapies, knowing about other cancer risks, identifying potential screening and risk-reduction options that may be appropriate, and to understand if other family members could be at risk  for cancer and allow them to undergo genetic testing.   We reviewed the characteristics, features and inheritance patterns of hereditary cancer syndromes. We also discussed genetic testing, including the appropriate family members to test, the process of testing, insurance coverage and turn-around-time for results. We discussed the implications of a negative, positive and/or variant of uncertain significant result. We recommended Craig [name] pursue genetic testing for the [facility] Common Hereditary Cancers Panel +RNA gene panel.   The Common Hereditary Cancers Panel offered by [facility] includes sequencing and/or deletion duplication testing of the following 47 genes: APC, ATM, AXIN2, BARD1, BMPR1A, BRCA1, BRCA2, BRIP1, CDH1, CDKN2A (p14ARF), CDKN2A (p16INK4a), CKD4, CHEK2, CTNNA1, DICER1, EPCAM (Deletion/duplication testing only), GREM1 (promoter region deletion/duplication testing only), KIT, MEN1, MLH1, MSH2, MSH3, MSH6, MUTYH, NBN, NF1, NHTL1, PALB2, PDGFRA, PMS2, POLD1, POLE, PTEN, RAD50, RAD51C, RAD51D, SDHB, SDHC, SDHD, SMAD4, SMARCA4. STK11, TP53, TSC1, TSC2, and VHL.  The following genes were evaluated for sequence changes only: SDHA and HOXB13 c.251G>A variant only.  Based on Mr. Carpenter personal history of cancer, he meets medical criteria for genetic testing. Despite that he meets criteria, he may still have an out of pocket cost.   PLAN: After considering the risks, benefits, and limitations, Craig [name] provided informed consent to pursue genetic testing and the blood sample was sent to [facility] for analysis of the Common Hereditary Cancers Panel +RNA. Results should be available within approximately 2-3 weeks' time, at which point they will be disclosed by telephone to Craig [name], as will any additional recommendations warranted by these results. Craig [name] will receive a summary of his genetic counseling visit and a copy of his results once available. This information will also be  available in Epic.   Craig [name] questions were answered to his satisfaction today. Our contact information was provided should additional questions or concerns arise. Thank you for the referral and allowing [place] to share in the care of your patient.   Faith Rogue, MS, [facility] Genetic Counselor [email or web address] Phone: [phone]  The patient was seen for a total of 15 minutes in face-to-face genetic counseling.  Dr. Grayland Ormond was available for discussion regarding this case.   _______________________________________________________________________ For Office Staff:  Number of people involved in session: 1 Was an Intern/ student involved with case: no

## 2020-08-19 NOTE — Progress Notes (Signed)
Patient has increase in fatigue.  Has occasional episodes of abdominal pain/discomfort with walking that he explains as "feels like something is that that ain't suppose to be there".

## 2020-08-19 NOTE — Progress Notes (Signed)
HR 101. Per Benjamine Mola RN per Dr. Tasia Catchings okay to proceed with scheduled treatment.   1245: Pt tolerated infusion well. No s/s of distress or reaction noted. Pt stable at discharge.

## 2020-08-21 ENCOUNTER — Inpatient Hospital Stay: Payer: Medicare Other

## 2020-08-21 DIAGNOSIS — Z5111 Encounter for antineoplastic chemotherapy: Secondary | ICD-10-CM | POA: Diagnosis not present

## 2020-08-21 DIAGNOSIS — C259 Malignant neoplasm of pancreas, unspecified: Secondary | ICD-10-CM

## 2020-08-21 MED ORDER — HEPARIN SOD (PORK) LOCK FLUSH 100 UNIT/ML IV SOLN
500.0000 [IU] | Freq: Once | INTRAVENOUS | Status: AC | PRN
  Administered 2020-08-21: 500 [IU]
  Filled 2020-08-21: qty 5

## 2020-08-21 MED ORDER — HEPARIN SOD (PORK) LOCK FLUSH 100 UNIT/ML IV SOLN
INTRAVENOUS | Status: AC
  Filled 2020-08-21: qty 5

## 2020-08-21 MED ORDER — SODIUM CHLORIDE 0.9% FLUSH
10.0000 mL | INTRAVENOUS | Status: DC | PRN
  Administered 2020-08-21: 10 mL
  Filled 2020-08-21: qty 10

## 2020-08-25 ENCOUNTER — Telehealth: Payer: Self-pay

## 2020-08-25 ENCOUNTER — Ambulatory Visit
Admission: RE | Admit: 2020-08-25 | Discharge: 2020-08-25 | Disposition: A | Discharge status: Home / Self Care | Payer: Medicare Other | Source: Ambulatory Visit | Attending: Adult Health | Admitting: Adult Health

## 2020-08-25 ENCOUNTER — Encounter: Payer: Self-pay | Admitting: Adult Health

## 2020-08-25 ENCOUNTER — Ambulatory Visit
Admission: RE | Admit: 2020-08-25 | Discharge: 2020-08-25 | Disposition: A | Discharge status: Home / Self Care | Payer: Medicare Other | Attending: Adult Health | Admitting: Adult Health

## 2020-08-25 ENCOUNTER — Ambulatory Visit (INDEPENDENT_AMBULATORY_CARE_PROVIDER_SITE_OTHER): Payer: Medicare Other | Admitting: Adult Health

## 2020-08-25 ENCOUNTER — Other Ambulatory Visit: Payer: Self-pay

## 2020-08-25 VITALS — BP 122/63 | HR 93 | Temp 98.4°F | Resp 16 | Wt 159.0 lb

## 2020-08-25 DIAGNOSIS — K5900 Constipation, unspecified: Secondary | ICD-10-CM | POA: Insufficient documentation

## 2020-08-25 DIAGNOSIS — R0602 Shortness of breath: Secondary | ICD-10-CM | POA: Insufficient documentation

## 2020-08-25 DIAGNOSIS — C787 Secondary malignant neoplasm of liver and intrahepatic bile duct: Secondary | ICD-10-CM

## 2020-08-25 DIAGNOSIS — R1011 Right upper quadrant pain: Secondary | ICD-10-CM | POA: Diagnosis not present

## 2020-08-25 DIAGNOSIS — R143 Flatulence: Secondary | ICD-10-CM | POA: Insufficient documentation

## 2020-08-25 DIAGNOSIS — C259 Malignant neoplasm of pancreas, unspecified: Secondary | ICD-10-CM

## 2020-08-25 MED ORDER — DOCUSATE SODIUM 100 MG PO CAPS: 100.0000 mg | ORAL_CAPSULE | Freq: Two times a day (BID) | ORAL | 0 refills | Status: AC

## 2020-08-25 NOTE — Progress Notes (Signed)
Constipation is seen on x ray, increase fluids, start colace.  Increase fiber in supplement or in diet if able.  Has follow up with Dr. Tasia Catchings.

## 2020-08-25 NOTE — Telephone Encounter (Addendum)
[place] message received form Craig [name]: [name] called and said he is having bad stomach pain. he is being seen by is primary care [name] but he wanted Dr. Tasia Catchings to know and for somone to follow up with him because he isnt sure if its related to his cancer or not  Dr. Tasia Catchings added Craig Glatter, NP to discussion:  Craig [name]? I offered him to start some narcotics and he refused as it was not that bad.  He is progressing so pain likely is due to tumor  Craig [name] would be available to see patien [name] in the clinic or virtual, whichever [name] prefers.  When patient was called to schedule him to see Craig [name] requested to be seen in clinic tomorrow with Craig [name].

## 2020-08-25 NOTE — Patient Instructions (Signed)
Abdominal Pain, Adult Many things can cause belly (abdominal) pain. Most times, belly pain is not dangerous. Many cases of belly pain can be watched and treated at home. Sometimes, though, belly pain is serious. Your doctor will try to find the cause of your belly pain. Follow these instructions at home:  Medicines  Take over-the-counter and prescription medicines only as told by your doctor.  Do not take medicines that help you poop (laxatives) unless told by your doctor. General instructions  Watch your belly pain for any changes.  Drink enough fluid to keep your pee (urine) pale yellow.  Keep all follow-up visits as told by your doctor. This is important. Contact a doctor if:  Your belly pain changes or gets worse.  You are not hungry, or you lose weight without trying.  You are having trouble pooping (constipated) or have watery poop (diarrhea) for more than 2-3 days.  You have pain when you pee or poop.  Your belly pain wakes you up at night.  Your pain gets worse with meals, after eating, or with certain foods.  You are vomiting and cannot keep anything down.  You have a fever.  You have blood in your pee. Get help right away if:  Your pain does not go away as soon as your doctor says it should.  You cannot stop vomiting.  Your pain is only in areas of your belly, such as the right side or the left lower part of the belly.  You have bloody or black poop, or poop that looks like tar.  You have very bad pain, cramping, or bloating in your belly.  You have signs of not having enough fluid or water in your body (dehydration), such as: ? Dark pee, very little pee, or no pee. ? Cracked lips. ? Dry mouth. ? Sunken eyes. ? Sleepiness. ? Weakness.  You have trouble breathing or chest pain. Summary  Many cases of belly pain can be watched and treated at home.  Watch your belly pain for any changes.  Take over-the-counter and prescription medicines only as  told by your doctor.  Contact a doctor if your belly pain changes or gets worse.  Get help right away if you have very bad pain, cramping, or bloating in your belly. This information is not intended to replace advice given to you by your health care provider. Make sure you discuss any questions you have with your health care provider. Document Revised: 02/05/2019 Document Reviewed: 02/05/2019 Elsevier Patient Education  [place]. Constipation, Adult Constipation is when a person:  Poops (has a bowel movement) fewer times in a week than normal.  Has a hard time pooping.  Has poop that is dry, hard, or bigger than normal. Follow these instructions at home: Eating and drinking   Eat foods that have a lot of fiber, such as: ? Fresh fruits and vegetables. ? Whole grains. ? Beans.  Eat less of foods that are high in fat, low in fiber, or overly processed, such as: ? [place] fries. ? Hamburgers. ? Cookies. ? Candy. ? Soda.  Drink enough fluid to keep your pee (urine) clear or pale yellow. General instructions  Exercise regularly or as told by your doctor.  Go to the restroom when you feel like you need to poop. Do not hold it in.  Take over-the-counter and prescription medicines only as told by your doctor. These include any fiber supplements.  Do pelvic floor retraining exercises, such as: ? Doing deep breathing  while relaxing your lower belly (abdomen). ? Relaxing your pelvic floor while pooping.  Watch your condition for any changes.  Keep all follow-up visits as told by your doctor. This is important. Contact a doctor if:  You have pain that gets worse.  You have a fever.  You have not pooped for 4 days.  You throw up (vomit).  You are not hungry.  You lose weight.  You are bleeding from the anus.  You have thin, pencil-like poop (stool). Get help right away if:  You have a fever, and your symptoms suddenly get worse.  You leak poop or have  blood in your poop.  Your belly feels hard or bigger than normal (is bloated).  You have very bad belly pain.  You feel dizzy or you faint. This information is not intended to replace advice given to you by your health care provider. Make sure you discuss any questions you have with your health care provider. Document Revised: 09/09/2017 Document Reviewed: 03/17/2016 Elsevier Patient Education  2020 [employer] capsules What is this medicine? DOCUSATE (doc CUE sayt) is stool softener. It helps prevent constipation and straining or discomfort associated with hard or dry stools. This medicine may be used for other purposes; ask your health care provider or pharmacist if you have questions. COMMON BRAND NAME(S): [employer]Health Stool Softner, Colace, Colace Clear, Correctol, D.O.S., DC, Doc-Q-Lace, DocuLace, Docusoft S, DOK, DOK Extra Strength, Dulcolax, Genasoft, Kao-Tin, Kaopectate Liqui-Gels, Phillips Stool Softener, Stool Softener, Stool Softner DC, Sulfolax, Sur-Q-Lax, Surfak, Uni-Ease What should I tell my health care provider before I take this medicine? They need to know if you have any of these conditions:  nausea or vomiting  severe constipation  stomach pain  sudden change in bowel habit lasting more than 2 weeks  an unusual or allergic reaction to docusate, other medicines, foods, dyes, or preservatives  pregnant or trying to get pregnant  breast-feeding How should I use this medicine? Take this medicine by mouth with a glass of water. Follow the directions on the label. Take your doses at regular intervals. Do not take your medicine more often than directed. Talk to your pediatrician regarding the use of this medicine in children. While this medicine may be prescribed for children as young as 2 years for selected conditions, precautions do apply. Overdosage: If you think you have taken too much of this medicine contact a poison control center or emergency room at  once. NOTE: This medicine is only for you. Do not share this medicine with others. What if I miss a dose? If you miss a dose, take it as soon as you can. If it is almost time for your next dose, take only that dose. Do not take double or extra doses. What may interact with this medicine?  mineral oil This list may not describe all possible interactions. Give your health care provider a list of all the medicines, herbs, non-prescription drugs, or dietary supplements you use. Also tell them if you smoke, drink alcohol, or use illegal drugs. Some items may interact with your medicine. What should I watch for while using this medicine? Do not use for more than one week without advice from your doctor or health care professional. If your constipation returns, check with your doctor or health care professional. Drink plenty of water while taking this medicine. Drinking water helps decrease constipation. Stop using this medicine and contact your doctor or health care professional if you experience any rectal bleeding or do not  have a bowel movement after use. These could be signs of a more serious condition. What side effects may I notice from receiving this medicine? Side effects that you should report to your doctor or health care professional as soon as possible:  allergic reactions like skin rash, itching or hives, swelling of the face, lips, or tongue Side effects that usually do not require medical attention (report to your doctor or health care professional if they continue or are bothersome):  diarrhea  stomach cramps  throat irritation This list may not describe all possible side effects. Call your doctor for medical advice about side effects. You may report side effects to FDA at 1-800-FDA-1088. Where should I keep my medicine? Keep out of the reach of children. Store at room temperature between 15 and 30 degrees C (59 and 86 degrees F). Throw away any unused medicine after the  expiration date. NOTE: This sheet is a summary. It may not cover all possible information. If you have questions about this medicine, talk to your doctor, pharmacist, or health care provider.  2020 Elsevier/Gold Standard (2008-01-18 15:56:49)

## 2020-08-25 NOTE — Progress Notes (Signed)
Established patient visit   Patient: Craig [name]   DOB: 03-10-1946   74 y.o. Male  MRN: [id number] Visit Date: 08/25/2020  Today's healthcare provider: Marcille Buffy, FNP   Chief Complaint  Patient presents with  . Abdominal Pain   Subjective    HPI  Abdominal Pain  He reports new onset abdominal pain. The most recent episode started yesterday and is gradually improving. The abdominal pain is located in the right upper quadrant and does not radiate. It is described as pressure-like, is 5/10 in intensity, occurring intermittently. It is aggravated by nothing and is relieved by nothing and being still. He has tried acetaminophen with moderate relief.  He has been belching more lately. Has had gas.  He feels he is constipated no bowel movement in 2 days.   Patient  denies any fever, body aches,chills, rash, chest pain, nausea, vomiting, or diarrhea.    08/19/2020 had chemo infusion- no change in therapy x 1 year.  He sees Dr. Tasia Catchings who  Follows pancreatic cancer, he has CT scan ordered.  He reports she has told him he is not doing to well and has about two months possibly to live.   Chronic shortness of breath.   Associated symptoms: No anorexia  Yes belching  No bloody stool No blood in urine   No constipation No diarrhea  No dysuria No fever  Yes flatus Yes headaches  No headaches No joint pains  No myalgias No nausea  No vomiting No weight loss     Recent GI studies:CT  Relevant medical history includes: pancreatic cancer   Previous labs Lab Results  Component Value Date   WBC 6.2 08/19/2020   HGB 9.8 (L) 08/19/2020   HCT 31.0 (L) 08/19/2020   MCV 94.2 08/19/2020   MCH 29.8 08/19/2020   RDW 17.9 (H) 08/19/2020   PLT 203 08/19/2020   Lab Results  Component Value Date   GLUCOSE 115 (H) 08/19/2020   NA 139 08/19/2020   K 4.4 08/19/2020   CL 106 08/19/2020   CO2 24 08/19/2020   BUN 19 08/19/2020   CREATININE 1.04 08/19/2020   GFRNONAA >60  08/19/2020   GFRAA >60 07/08/2020   CALCIUM 9.0 08/19/2020   PHOS 4.0 08/24/2018   PROT 6.8 08/19/2020   ALBUMIN 3.1 (L) 08/19/2020   LABGLOB 2.5 01/29/2020   AGRATIO 1.4 01/29/2020   BILITOT 0.7 08/19/2020   ALKPHOS 122 08/19/2020   AST 42 (H) 08/19/2020   ALT 30 08/19/2020   ANIONGAP 9 08/19/2020   Lab Results  Component Value Date   AMYLASE 161 (H) 05/15/2019   -----------------------------------------------------------------------------------------  Patient Active Problem List   Diagnosis Date Noted  . Flatulence 08/25/2020  . Constipation 08/25/2020  . RUQ abdominal pain 08/25/2020  . Family history of brain cancer   . Family history of cancer   . Neuropathy 06/18/2020  . Hypokalemia 03/17/2020  . 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 Malnutrition of moderate degree 01/26/2020  . DKA (diabetic ketoacidoses) 01/24/2020  . Fatigue 10/03/2019  . Low-level of literacy 06/29/2019  . Abnormal LFTs 06/29/2019  . AKI (acute kidney injury) ([place]) 06/29/2019  . Diarrhea 06/29/2019  . Orthostatic hypotension 06/29/2019  . Hyperglycemia 06/29/2019  . Anemia due to antineoplastic chemotherapy 06/29/2019  . Dehydration 06/21/2019  . Encounter for antineoplastic chemotherapy 06/21/2019  . Normocytic anemia 06/21/2019  . Port-A-Cath in place 06/07/2019  . Goals of care, counseling/discussion 06/01/2019  . Pancreatic cancer metastasized to liver ([place]) 06/01/2019  .  Liver metastasis ([place]) 06/01/2019  . CAD (coronary artery disease) 05/17/2019  . Personal history of tobacco use, presenting hazards to health 05/10/2019  . Peripheral arterial disease ([place]) 05/09/2019  . Prediabetes 05/01/2019  . LVH (left ventricular hypertrophy) due to hypertensive disease, without heart failure 01/04/2018  . 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 Postoperative anemia   . Coronary artery disease involving native coronary artery of native heart   . Coronary artery disease involving native coronary artery of  native heart 09/16/2017  . Chronic shortness of breath 08/11/2017  . Tobacco abuse 08/11/2017  . Spondylolisthesis, lumbar region 03/16/2017  . Hyperlipemia, mixed 09/10/2015  . 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 Essential (primary) hypertension 09/10/2015  . Degeneration of cervical intervertebral disc 09/10/2015  . Allergic rhinitis 09/10/2015  . GERD (gastroesophageal reflux disease) 06/25/2015  . Cervical stenosis of spinal canal 06/25/2015  . Atherosclerosis of native arteries of extremity with intermittent claudication ([place]) 06/25/2015   Past Medical History:  Diagnosis Date  . Allergic rhinitis   . CHF (congestive heart failure) ([place])   . Chronic cholecystitis   . Coronary artery disease   . DDD (degenerative disc disease), cervical   . Dehydration 06/21/2019  . Diabetes mellitus without complication ([employer])   . Dyspnea   . Early cataract   . Erectile dysfunction   . Family history of brain cancer   . Family history of cancer   . GERD (gastroesophageal reflux disease)   . Gouty arthritis   . Headache    occasional migraines  . Hypercalcemia   . Hyperlipemia   . Hypertension   . Palpitations   . Pancreatic cancer metastasized to liver ([place]) 05/2019   Chemo tx's  . Peripheral vascular disease ([place])   . 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 Spinal stenosis of cervical region   . Vitamin D deficiency    Allergies  Allergen Reactions  . Lipitor [Atorvastatin] Other (See Comments)    MYALGIA  . Zetia [Ezetimibe] Other (See Comments)    MYALGIA  . Lisinopril Cough  . Protonix [Pantoprazole] Other (See Comments)    headache  . Spironolactone Other (See Comments)    Breast tenderness       Medications: Outpatient Medications Prior to Visit  Medication Sig  . aspirin EC 81 MG tablet Take 81 mg by mouth daily.  . carvedilol (COREG) 6.25 MG tablet Take 6.25 mg by mouth 2 (two)  times daily with a meal.  . Continuous Blood Gluc Receiver (FREESTYLE LIBRE 14 DAY READER) DEVI 1 Device by Does not apply route every 14 (fourteen) days.  [name] Kitchen diltiazem (CARDIZEM CD) 300 MG 24 hr capsule TAKE 1 CAPSULE BY MOUTH EVERY DAY  . dorzolamide-timolol (COSOPT) 22.3-6.8 MG/ML ophthalmic solution INSTILL ONE DROP INTO BOTH EYES TWICE DAILY  . DULoxetine (CYMBALTA) 30 MG capsule TAKE 2 CAPSULES BY MOUTH EVERY DAY  . ferrous sulfate 325 (65 FE) MG tablet TAKE 1 TABLET BY MOUTH EVERY DAY WITH BREAKFAST  . gabapentin (NEURONTIN) 300 MG capsule Take 300 mg by mouth 3 (three) times daily.  [name] Kitchen ibuprofen (ADVIL) 800 MG tablet Take 1 tablet (800 mg total) by mouth every 8 (eight) hours as needed.  . insulin glargine (LANTUS SOLOSTAR) 100 UNIT/ML Solostar Pen Inject 20 Units into the skin at bedtime. (Patient taking differently: Inject 16 Units into the skin at bedtime. )  .  insulin lispro (HUMALOG) 100 UNIT/ML KwikPen Inject 0.05 mLs (5 Units total) into the skin 3 (three) times daily with meals.  . Insulin Pen Needle (PEN NEEDLES) 32G X 5 MM MISC Use four times daily for insulin  . latanoprost (XALATAN) 0.005 % ophthalmic solution Place 1 drop into both eyes at bedtime.   . lidocaine-prilocaine (EMLA) cream APPLY TO AFFECTED AREA ONCE AS DIRECTED  . Multiple Vitamin (MULTIVITAMIN WITH MINERALS) TABS tablet Take 1 tablet by mouth daily.   . OMEGA-3 FATTY ACIDS PO Take 1,200 mg by mouth daily.   [name] Kitchen omeprazole (PRILOSEC) 40 MG capsule TAKE 1 CAPSULE BY MOUTH EVERY DAY  . ONETOUCH VERIO test strip TEST FASTING SUGAR EVERY MORNING  . oxyCODONE (OXY IR/ROXICODONE) 5 MG immediate release tablet Take 1 tablet (5 mg total) by mouth every 6 (six) hours as needed for severe pain.  . rosuvastatin (CRESTOR) 10 MG tablet Take 1 tablet by mouth daily.  [name] Kitchen triamcinolone ointment (KENALOG) 0.5 % APPLY 1 APPLICATION TOPICALLY 3 (THREE) TIMES DAILY.  . [DISCONTINUED] docusate sodium (COLACE) 100 MG capsule Take 1  capsule (100 mg total) by mouth daily.   Facility-Administered Medications Prior to Visit  Medication Dose Route Frequency Provider  . sodium chloride flush (NS) 0.9 % injection 10 mL  10 mL Intracatheter PRN Earlie Server, MD  . sodium chloride flush (NS) 0.9 % injection 10 mL  10 mL Intravenous Once Earlie Server, MD    Review of Systems  Constitutional: Positive for fatigue. Negative for activity change, appetite change, chills, diaphoresis, fever and unexpected weight change.  Respiratory: Negative.   Cardiovascular: Negative.   Gastrointestinal: Positive for abdominal pain and constipation. Negative for abdominal distention, anal bleeding, blood in stool, diarrhea and nausea.    Last CBC Lab Results  Component Value Date   WBC 6.2 08/19/2020   HGB 9.8 (L) 08/19/2020   HCT 31.0 (L) 08/19/2020   MCV 94.2 08/19/2020   MCH 29.8 08/19/2020   RDW 17.9 (H) 08/19/2020   PLT 203 54/62/7035   Last metabolic panel Lab Results  Component Value Date   GLUCOSE 115 (H) 08/19/2020   NA 139 08/19/2020   K 4.4 08/19/2020   CL 106 08/19/2020   CO2 24 08/19/2020   BUN 19 08/19/2020   CREATININE 1.04 08/19/2020   GFRNONAA >60 08/19/2020   GFRAA >60 07/08/2020   CALCIUM 9.0 08/19/2020   PHOS 4.0 08/24/2018   PROT 6.8 08/19/2020   ALBUMIN 3.1 (L) 08/19/2020   LABGLOB 2.5 01/29/2020   AGRATIO 1.4 01/29/2020   BILITOT 0.7 08/19/2020   ALKPHOS 122 08/19/2020   AST 42 (H) 08/19/2020   ALT 30 08/19/2020   ANIONGAP 9 08/19/2020      Objective    BP 122/63   Pulse 93   Temp 98.4 F (36.9 C) (Oral)   Resp 16   Wt 159 lb (72.1 kg)   SpO2 100%   BMI 23.48 kg/m  BP Readings from Last 3 Encounters:  08/25/20 122/63  08/19/20 100/66  08/05/20 121/74   Wt Readings from Last 3 Encounters:  08/25/20 159 lb (72.1 kg)  08/19/20 161 lb 4.8 oz (73.2 kg)  08/05/20 163 lb 4.8 oz (74.1 kg)     Patient is alert and oriented and responsive to questions Engages in eye contact with provider. Speaks  in full sentences without any pauses without any shortness of breath or distress.   Physical Exam Constitutional:      General: He is not in  acute distress.    Appearance: He is not toxic-appearing or diaphoretic.  HENT:     Head: Normocephalic and atraumatic.     Mouth/Throat:     Mouth: Mucous membranes are moist.     Pharynx: Oropharynx is clear.  Eyes:     Extraocular Movements: Extraocular movements intact.     Pupils: Pupils are equal, round, and reactive to light.  Cardiovascular:     Rate and Rhythm: Normal rate and regular rhythm.     Heart sounds: Normal heart sounds. No murmur heard.  No friction rub. No gallop.   Pulmonary:     Effort: Pulmonary effort is normal. No respiratory distress.     Breath sounds: Normal breath sounds. No stridor. No wheezing, rhonchi or rales.  Chest:     Chest wall: No tenderness.  Abdominal:     General: Abdomen is flat. There is no distension or abdominal bruit. There are no signs of injury.     Palpations: Abdomen is soft.     Tenderness: There is no abdominal tenderness. There is no right CVA tenderness, left CVA tenderness, guarding or rebound.     Hernia: No hernia is present.  Skin:    General: Skin is warm.     Findings: No rash.  Neurological:     General: No focal deficit present.     Mental Status: He is alert.       No results found for any visits on 08/25/20.  Assessment & Plan     Pancreatic cancer metastasized to liver [facility]) - Plan: CBC with Differential/Platelet, Comprehensive Metabolic Panel (CMET)  RUQ abdominal pain - Plan: CBC with Differential/Platelet, Comprehensive Metabolic Panel (CMET)  Constipation, unspecified constipation type - Plan: DG Chest 2 View, DG Abd 1 View  Chronic shortness of breath - Plan: DG Chest 2 View  Flatulence   He does point to RUQ , epigastric with area of pressure. He has no pain with abdominal exam/ deep and light palpation.  Had chemo infusion 2 days ago.   Appetite is  decreased in last while and his bowel movements not as regular as prior. He has also stopped his Colace.   Orders Placed This Encounter  Procedures  . DG Chest 2 View    Order Specific Question:   Reason for Exam (SYMPTOM  OR DIAGNOSIS REQUIRED)    Answer:   rule out constipation    Order Specific Question:   Preferred imaging location?    Answer:   [facility]  . DG Abd 1 View    Order Specific Question:   Reason for Exam (SYMPTOM  OR DIAGNOSIS REQUIRED)    Answer:   rule out constipation    Order Specific Question:   Preferred imaging location?    Answer:   [facility]  . CBC with Differential/Platelet  . Comprehensive Metabolic Panel (CMET)   Increase fiber in diet, increase fluids.   Keep follow up with oncology tomorrow. Suspect constipation however with chronic disease and terminal illness can not exclude side effect from chemotherapy or advancing pancreatic cancer.  Return in about 1 week (around 09/01/2020), or if symptoms worsen or fail to improve, for at any time for any worsening symptoms.     Red Flags discussed. The patient was given clear instructions to go to ER or return to medical center if any red flags develop, symptoms do not improve, worsen or new problems develop. They verbalized understanding.  Addressed extensive list of chronic and acute medical problems  today requiring 30 minutes reviewing his medical record, counseling patient regarding his conditions and coordination of care.       Marcille Buffy, [place] [phone] (phone) [phone] (fax)  [place]

## 2020-08-26 ENCOUNTER — Encounter: Payer: Self-pay | Admitting: Hospice and Palliative Medicine

## 2020-08-26 ENCOUNTER — Inpatient Hospital Stay (HOSPITAL_BASED_OUTPATIENT_CLINIC_OR_DEPARTMENT_OTHER): Payer: Medicare Other | Admitting: Hospice and Palliative Medicine

## 2020-08-26 VITALS — BP 136/66 | HR 84 | Temp 97.8°F | Resp 20 | Wt 160.7 lb

## 2020-08-26 DIAGNOSIS — Z5111 Encounter for antineoplastic chemotherapy: Secondary | ICD-10-CM | POA: Diagnosis not present

## 2020-08-26 DIAGNOSIS — C787 Secondary malignant neoplasm of liver and intrahepatic bile duct: Secondary | ICD-10-CM

## 2020-08-26 DIAGNOSIS — C259 Malignant neoplasm of pancreas, unspecified: Secondary | ICD-10-CM | POA: Diagnosis not present

## 2020-08-26 DIAGNOSIS — Z515 Encounter for palliative care: Secondary | ICD-10-CM

## 2020-08-26 DIAGNOSIS — G893 Neoplasm related pain (acute) (chronic): Secondary | ICD-10-CM | POA: Diagnosis not present

## 2020-08-26 NOTE — Progress Notes (Signed)
[place]  [phone] [phone]   Name: Nhan Qualley Date: 08/26/2020 MRN: [id number]  DOB: 12/01/1945  Patient Care Team: Paulene Floor as PCP - General (Physician Assistant) Clent Jacks, RN as Oncology Nurse Navigator Earlie Server, MD as Consulting Physician (Hematology and Oncology) Neldon Labella, RN as Case Manager Lonia Farber, MD as Consulting Physician (Internal Medicine)    REASON FOR CONSULTATION: Palliative Care consult requested for this 74 y.o. 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  He was referred to palliative care to help address goals and manage ongoing symptoms.  SOCIAL HISTORY:     reports that he quit smoking about 2 years ago. His smoking use included cigarettes. He has a 37.50 pack-year smoking history. He has quit using smokeless tobacco.  His smokeless tobacco use included chew. He reports previous alcohol use. He reports previous drug use. Drug: Marijuana.   Patient is married and lives at home with his wife.  He has a son and a daughter but reports that he is estranged from both.  He had another daughter who is now deceased.  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  Note that patient is illiterate.  He also says that his wife often will not answer the phone and recommends that we call and leave a message on the answering machine and then call her right back.  ADVANCE DIRECTIVES:  Does not have  CODE STATUS: DNR/DNI (MOST form completed on 08/31/2019)  PAST MEDICAL HISTORY: Past Medical History:  Diagnosis Date  . Allergic rhinitis   . CHF (congestive heart failure) ([place])   . Chronic cholecystitis   .  Coronary artery disease   . DDD (degenerative disc disease), cervical   . Dehydration 06/21/2019  . Diabetes mellitus without complication ([place])   . Dyspnea   . Early cataract   . Erectile dysfunction   . Family history of brain cancer   . Family history of cancer   . GERD (gastroesophageal reflux disease)   . Gouty arthritis   . Headache    occasional migraines  . Hypercalcemia   . Hyperlipemia   . Hypertension   . Palpitations   . Pancreatic cancer metastasized to liver ([place]) 05/2019   Chemo tx's  . Peripheral vascular disease ([place])   . 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   LIMA to LAD SVG to [place] SVG SEQUENTIALLY to OM1 and OM2 SVG to ACUTE MARGINAL  . Spinal stenosis of cervical region   . Vitamin D deficiency     PAST SURGICAL HISTORY:  Past Surgical History:  Procedure Laterality Date  . BACK SURGERY  2018    fusion with screws  . CHOLECYSTECTOMY N/A 11/13/2018   Procedure: LAPAROSCOPIC CHOLECYSTECTOMY;  Surgeon: Vickie Epley, MD;  Location: [facility];  Service: General;  Laterality: N/A;  . COLONOSCOPY    . CORONARY ARTERY BYPASS GRAFT N/A 09/30/2017   Procedure: CORONARY ARTERY BYPASS GRAFTING times five using right and left Saphaneous vein harvested endoscopicly  and left internal mammary artery. (CABG),TEE;  Surgeon: Rexene Alberts, MD;  Location: [facility];  Service: Open Heart Surgery;  Laterality: N/A;  . LEFT HEART CATH AND CORONARY ANGIOGRAPHY Left 09/06/2017  Procedure: LEFT HEART CATH AND CORONARY ANGIOGRAPHY;  Surgeon: Corey Skains, MD;  Location: [facility];  Service: Cardiovascular;  Laterality: Left;  . percutaneous transluminal balloon angioplasty  01/2010   of left lower extremity  . PORTA CATH INSERTION N/A 06/06/2019   Procedure: PORTA CATH INSERTION;  Surgeon: Katha Cabal, MD;  Location: [facility];  Service: Cardiovascular;  Laterality: N/A;  . TEE WITHOUT CARDIOVERSION N/A 09/30/2017   Procedure: TRANSESOPHAGEAL  ECHOCARDIOGRAM (TEE);  Surgeon: Rexene Alberts, MD;  Location: [facility];  Service: Open Heart Surgery;  Laterality: N/A;  . VASCULAR SURGERY Left 2010   left exernal iliac and superficial femoral artery PTCA and stenting    HEMATOLOGY/ONCOLOGY HISTORY:  Oncology History  Pancreatic cancer metastasized to liver ([place])  06/01/2019 Initial Diagnosis   Pancreatic cancer metastasized to liver ([place])   06/07/2019 - 10/31/2019 Chemotherapy   The patient had PACLitaxel-protein bound (ABRAXANE) chemo infusion 250 mg, 125 mg/m2 = 250 mg (100 % of original dose 125 mg/m2), Intravenous,  Once, 4 of 4 cycles Dose modification: 100 mg/m2 (original dose 125 mg/m2, Cycle 1, Reason: Provider Judgment), 125 mg/m2 (original dose 125 mg/m2, Cycle 1, Reason: Provider Judgment), 100 mg/m2 (original dose 125 mg/m2, Cycle 2, Reason: Dose not tolerated, Comment: neuropathy), 90 mg/m2 (original dose 125 mg/m2, Cycle 3, Reason: Dose not tolerated, Comment: neuropathy), 100 mg/m2 (original dose 125 mg/m2, Cycle 3, Reason: Dose not tolerated) Administration: 250 mg (06/07/2019), 250 mg (06/14/2019), 250 mg (06/21/2019), 200 mg (07/05/2019), 200 mg (07/12/2019), 200 mg (07/26/2019), 200 mg (08/02/2019), 200 mg (08/16/2019), 200 mg (08/31/2019) gemcitabine (GEMZAR) 2,000 mg in sodium chloride 0.9 % 250 mL chemo infusion, 2,090 mg, Intravenous,  Once, 7 of 8 cycles Dose modification: 800 mg/m2 (original dose 1,000 mg/m2, Cycle 5, Reason: Dose not tolerated), 1,000 mg/m2 (original dose 1,000 mg/m2, Cycle 5, Reason: Provider Judgment) Administration: 2,000 mg (06/07/2019), 2,000 mg (09/14/2019), 2,000 mg (09/21/2019), 2,000 mg (10/03/2019), 2,000 mg (10/10/2019), 2,000 mg (06/14/2019), 2,000 mg (07/05/2019), 1,600 mg (07/12/2019), 1,600 mg (07/26/2019), 1,600 mg (08/02/2019), 1,600 mg (08/16/2019), 1,600 mg (08/31/2019), 2,000 mg (10/24/2019), 2,000 mg (10/31/2019)  for chemotherapy treatment.    11/19/2019 - 12/31/2019 Chemotherapy   The patient had  pembrolizumab (KEYTRUDA) 200 mg in sodium chloride 0.9 % 50 mL chemo infusion, 200 mg, Intravenous, Once, 3 of 6 cycles Administration: 200 mg (11/19/2019), 200 mg (12/31/2019), 200 mg (12/10/2019)  for chemotherapy treatment.    01/07/2020 - 05/29/2020 Chemotherapy   The patient had dexamethasone (DECADRON) 4 MG tablet, 8 mg, Oral, Daily, 1 of 1 cycle, Start date: 01/02/2020, End date: 01/07/2020 palonosetron (ALOXI) injection 0.25 mg, 0.25 mg, Intravenous,  Once, 11 of 12 cycles Administration: 0.25 mg (01/07/2020), 0.25 mg (01/21/2020), 0.25 mg (02/04/2020), 0.25 mg (02/18/2020), 0.25 mg (03/03/2020), 0.25 mg (03/17/2020), 0.25 mg (03/31/2020), 0.25 mg (04/15/2020), 0.25 mg (04/29/2020), 0.25 mg (05/13/2020), 0.25 mg (05/27/2020) fluorouracil (ADRUCIL) 4,550 mg in sodium chloride 0.9 % 59 mL chemo infusion, 2,400 mg/m2 = 4,550 mg, Intravenous, 1 Day/Dose, 11 of 12 cycles Dose modification: 800 mg/m2/day (original dose 800 mg/m2/day, Cycle 6) Administration: 4,550 mg (01/07/2020), 4,550 mg (01/21/2020), 4,550 mg (02/04/2020), 4,550 mg (02/18/2020), 4,550 mg (03/03/2020), 4,550 mg (03/17/2020), 4,550 mg (03/31/2020), 4,550 mg (04/15/2020), 4,550 mg (04/29/2020), 1,500 mg (03/19/2020), 4,550 mg (05/13/2020), 4,550 mg (05/27/2020) irinotecan LIPOSOME (ONIVYDE) 96 mg in sodium chloride 0.9 % 500 mL chemo infusion, 94.6 mg (100 % of original dose 50 mg/m2), Intravenous, Once, 11 of 12 cycles Dose modification:  50 mg/m2 (original dose 50 mg/m2, Cycle 1, Reason: Provider Judgment) Administration: 96 mg (01/07/2020), 129 mg (01/21/2020), 129 mg (02/04/2020), 129 mg (02/18/2020), 129 mg (03/03/2020), 129 mg (03/17/2020), 129 mg (03/31/2020), 129 mg (04/15/2020), 129 mg (04/29/2020), 129 mg (05/13/2020), 129 mg (05/27/2020) leucovorin 800 mg in sodium chloride 0.9 % 250 mL infusion, 760 mg, Intravenous,  Once, 11 of 12 cycles Administration: 800 mg (01/07/2020), 800 mg (01/21/2020), 800 mg (02/04/2020), 800 mg (02/18/2020), 800 mg (03/03/2020), 800 mg (03/17/2020),  800 mg (03/31/2020), 800 mg (04/15/2020), 800 mg (04/29/2020), 800 mg (05/13/2020), 800 mg (05/27/2020)  for chemotherapy treatment.    06/10/2020 -  Chemotherapy   The patient had palonosetron (ALOXI) injection 0.25 mg, 0.25 mg, Intravenous,  Once, 6 of 7 cycles Administration: 0.25 mg (06/10/2020), 0.25 mg (06/24/2020), 0.25 mg (07/08/2020), 0.25 mg (07/22/2020), 0.25 mg (08/05/2020), 0.25 mg (08/19/2020) leucovorin 800 mg in dextrose 5 % 250 mL infusion, 410 mg/m2 = 780 mg, Intravenous,  Once, 6 of 7 cycles Administration: 800 mg (06/10/2020), 800 mg (06/24/2020), 800 mg (07/08/2020), 800 mg (07/22/2020), 800 mg (08/05/2020) oxaliplatin (ELOXATIN) 165 mg in dextrose 5 % 500 mL chemo infusion, 85 mg/m2 = 165 mg, Intravenous,  Once, 6 of 7 cycles Dose modification: 65 mg/m2 (original dose 85 mg/m2, Cycle 6, Reason: Other (see comments), Comment: neuropathy) Administration: 165 mg (06/10/2020), 165 mg (06/24/2020), 165 mg (07/08/2020), 165 mg (07/22/2020), 165 mg (08/05/2020), 125 mg (08/19/2020) fluorouracil (ADRUCIL) 4,700 mg in sodium chloride 0.9 % 56 mL chemo infusion, 2,400 mg/m2 = 4,700 mg, Intravenous, 1 Day/Dose, 6 of 7 cycles Administration: 4,700 mg (06/10/2020), 4,700 mg (06/24/2020), 4,700 mg (07/08/2020), 4,700 mg (07/22/2020), 4,700 mg (08/05/2020), 4,700 mg (08/19/2020)  for chemotherapy treatment.      ALLERGIES:  is allergic to lipitor [atorvastatin], zetia [ezetimibe], lisinopril, protonix [pantoprazole], and spironolactone.  MEDICATIONS:  Current Outpatient Medications  Medication Sig Dispense Refill  . aspirin EC 81 MG tablet Take 81 mg by mouth daily.    . carvedilol (COREG) 6.25 MG tablet Take 6.25 mg by mouth 2 (two) times daily with a meal.  3  . Continuous Blood Gluc Receiver (FREESTYLE LIBRE 14 DAY READER) DEVI 1 Device by Does not apply route every 14 (fourteen) days. 6 each 1  . diltiazem (CARDIZEM CD) 300 MG 24 hr capsule TAKE 1 CAPSULE BY MOUTH EVERY DAY 90 capsule 0  . docusate  sodium (COLACE) 100 MG capsule Take 1 capsule (100 mg total) by mouth 2 (two) times daily. 60 capsule 0  . dorzolamide-timolol (COSOPT) 22.3-6.8 MG/ML ophthalmic solution INSTILL ONE DROP INTO BOTH EYES TWICE DAILY    . DULoxetine (CYMBALTA) 30 MG capsule TAKE 2 CAPSULES BY MOUTH EVERY DAY 180 capsule 1  . ferrous sulfate 325 (65 FE) MG tablet TAKE 1 TABLET BY MOUTH EVERY DAY WITH BREAKFAST 90 tablet 1  . gabapentin (NEURONTIN) 300 MG capsule Take 300 mg by mouth 3 (three) times daily.    [name] Kitchen ibuprofen (ADVIL) 800 MG tablet Take 1 tablet (800 mg total) by mouth every 8 (eight) hours as needed. 30 tablet 0  . insulin glargine (LANTUS SOLOSTAR) 100 UNIT/ML Solostar Pen Inject 20 Units into the skin at bedtime. (Patient taking differently: Inject 16 Units into the skin at bedtime. ) 15 mL 0  . insulin lispro (HUMALOG) 100 UNIT/ML KwikPen Inject 0.05 mLs (5 Units total) into the skin 3 (three) times daily with meals. 15 mL 11  . Insulin Pen Needle (PEN NEEDLES) 32G X 5 MM MISC Use  four times daily for insulin 400 each 1  . latanoprost (XALATAN) 0.005 % ophthalmic solution Place 1 drop into both eyes at bedtime.   3  . lidocaine-prilocaine (EMLA) cream APPLY TO AFFECTED AREA ONCE AS DIRECTED    . Multiple Vitamin (MULTIVITAMIN WITH MINERALS) TABS tablet Take 1 tablet by mouth daily.     . OMEGA-3 FATTY ACIDS PO Take 1,200 mg by mouth daily.     [name] Kitchen omeprazole (PRILOSEC) 40 MG capsule TAKE 1 CAPSULE BY MOUTH EVERY DAY 90 capsule 1  . ONETOUCH VERIO test strip TEST FASTING SUGAR EVERY MORNING 100 strip 3  . oxyCODONE (OXY IR/ROXICODONE) 5 MG immediate release tablet Take 1 tablet (5 mg total) by mouth every 6 (six) hours as needed for severe pain. 30 tablet 0  . rosuvastatin (CRESTOR) 10 MG tablet Take 1 tablet by mouth daily.    [name] Kitchen triamcinolone ointment (KENALOG) 0.5 % APPLY 1 APPLICATION TOPICALLY 3 (THREE) TIMES DAILY. 30 g 0   No current facility-administered medications for this visit.    Facility-Administered Medications Ordered in Other Visits  Medication Dose Route Frequency Provider Last Rate Last Admin  . sodium chloride flush (NS) 0.9 % injection 10 mL  10 mL Intracatheter PRN Earlie Server, MD      . sodium chloride flush (NS) 0.9 % injection 10 mL  10 mL Intravenous Once Earlie Server, MD        VITAL SIGNS: BP 136/66   Pulse 84   Temp 97.8 F (36.6 C)   Resp 20   Wt 160 lb 11.2 oz (72.9 kg)   SpO2 100%   BMI 23.73 kg/m  Filed Weights   08/26/20 1019  Weight: 160 lb 11.2 oz (72.9 kg)    Estimated body mass index is 23.73 kg/m as calculated from the following:   Height as of 02/26/20: 5\' 9"  (1.753 m).   Weight as of this encounter: 160 lb 11.2 oz (72.9 kg).  LABS: CBC:    Component Value Date/Time   WBC 6.2 08/19/2020 0827   HGB 9.8 (L) 08/19/2020 0827   HGB 11.9 (L) 01/29/2020 1429   HCT 31.0 (L) 08/19/2020 0827   HCT 35.8 (L) 01/29/2020 1429   PLT 203 08/19/2020 0827   PLT 187 01/29/2020 1429   MCV 94.2 08/19/2020 0827   MCV 86 01/29/2020 1429   NEUTROABS 4.3 08/19/2020 0827   NEUTROABS 5.0 01/29/2020 1429   LYMPHSABS 0.8 08/19/2020 0827   LYMPHSABS 1.3 01/29/2020 1429   MONOABS 1.0 08/19/2020 0827   EOSABS 0.1 08/19/2020 0827   EOSABS 0.2 01/29/2020 1429   BASOSABS 0.0 08/19/2020 0827   BASOSABS 0.0 01/29/2020 1429   Comprehensive Metabolic Panel:    Component Value Date/Time   NA 139 08/19/2020 0827   NA 137 01/29/2020 1429   K 4.4 08/19/2020 0827   CL 106 08/19/2020 0827   CO2 24 08/19/2020 0827   BUN 19 08/19/2020 0827   BUN 20 01/29/2020 1429   CREATININE 1.04 08/19/2020 0827   CREATININE 0.95 06/17/2017 1035   GLUCOSE 115 (H) 08/19/2020 0827   CALCIUM 9.0 08/19/2020 0827   AST 42 (H) 08/19/2020 0827   ALT 30 08/19/2020 0827   ALKPHOS 122 08/19/2020 0827   BILITOT 0.7 08/19/2020 0827   BILITOT 0.5 01/29/2020 1429   PROT 6.8 08/19/2020 0827   PROT 6.1 01/29/2020 1429   ALBUMIN 3.1 (L) 08/19/2020 0827   ALBUMIN 3.6 (L)  01/29/2020 1429    RADIOGRAPHIC STUDIES: DG Chest 2  View  Result Date: 08/25/2020 CLINICAL DATA:  Shortness of breath. EXAM: CHEST - 2 VIEW COMPARISON:  01/23/2018 FINDINGS: The right IJ Port-A-Cath is in good position. Stable tortuosity, ectasia and calcification of the thoracic aorta. Stable surgical changes from triple bypass surgery. The lungs are clear of an acute process. No pleural effusions or pulmonary edema. The bony thorax is intact. IMPRESSION: No acute cardiopulmonary findings. Electronically Signed   By: Marijo Sanes M.D.   On: 08/25/2020 16:19   DG Abd 1 View  Result Date: 08/25/2020 CLINICAL DATA:  Abdominal pain. EXAM: ABDOMEN - 1 VIEW COMPARISON:  01/03/2014 FINDINGS: There is moderate stool throughout the colon and down into the rectum suggesting constipation. No distended small bowel loops are identified. No free air. The bony structures are intact. Lumbar fusion hardware noted. Vascular calcifications are noted. IMPRESSION: Moderate stool throughout the colon suggesting constipation. Electronically Signed   By: Marijo Sanes M.D.   On: 08/25/2020 16:21    PERFORMANCE STATUS (ECOG) : 1  Review of Systems Unless otherwise noted, a complete review of systems is negative.  Physical Exam General: NAD, frail appearing, thin Pulmonary: Clear to auscultation anterior/posterior fields Cardiac: Regular rate and rhythm Abdomen: Soft, nontender to palp, normal bowel sounds Extremities: no edema, no joint deformities Skin: no rashes Neurological: Weakness but otherwise nonfocal  IMPRESSION: Patient was an add-on to my clinic schedule today for evaluation management of abdominal pain.  Patient states that he started having right upper quadrant/flank pain several days ago coinciding with lifting and twisting with yard work.  He denies fever or chills.  No nausea or vomiting.  No changes in appetite.  He endorses constipation.  Patient saw Laverna Peace, NP yesterday for same.   KUB revealed constipation.  Patient reports that he has had a bowel movement today but does admit to having constipation recently.  We discussed bowel regimen in detail.  Although constipation could be a factor in his abdominal pain, this could also be secondary to his cancer.  He is pending abdominal CT later this week for better characterization of his cancer.  Will await results prior to further management.  Discussed pain management today in detail.  Patient has oxycodone but he rarely takes it.  I encouraged him to utilize it as needed for pain.  PLAN: -Continue current scope of treatment -Continue oxycodone as needed for pain -Continue daily bowel regimen -Plan for abdominal CT later this week and then follow-up with Dr. Tasia Catchings  Case and plan discussed with Dr. Tasia Catchings  Patient expressed understanding and was in agreement with this plan. He also understands that He can call the clinic at any time with any questions, concerns, or complaints.     Time Total: 15 minutes  Visit consisted of counseling and education dealing with the complex and emotionally intense issues of symptom management and palliative care in the setting of serious and potentially life-threatening illness.Greater than 50%  of this time was spent counseling and coordinating care related to the above assessment and plan.  Signed by: Altha Harm, PhD, NP-C [phone] (Work Cell)

## 2020-08-27 ENCOUNTER — Telehealth: Payer: Self-pay

## 2020-08-27 NOTE — Telephone Encounter (Signed)
-----   Message from Doreen Beam, [place] sent at 08/25/2020  9:44 PM EST ----- Stable cxr

## 2020-08-27 NOTE — Telephone Encounter (Signed)
Pt advised.   Thanks,   -Logen Fowle  

## 2020-08-27 NOTE — Telephone Encounter (Signed)
-----   Message from Doreen Beam, [place] sent at 08/25/2020  7:44 PM EST ----- Constipation is seen on x ray, increase fluids, start colace.  Increase fiber in supplement or in diet if able.  Has follow up with Dr. Tasia Catchings.

## 2020-08-29 ENCOUNTER — Ambulatory Visit
Admission: RE | Admit: 2020-08-29 | Discharge: 2020-08-29 | Disposition: A | Discharge status: Home / Self Care | Payer: Medicare Other | Source: Ambulatory Visit | Attending: Oncology | Admitting: Oncology

## 2020-08-29 ENCOUNTER — Other Ambulatory Visit: Payer: Self-pay

## 2020-08-29 DIAGNOSIS — C259 Malignant neoplasm of pancreas, unspecified: Secondary | ICD-10-CM | POA: Insufficient documentation

## 2020-08-29 DIAGNOSIS — C787 Secondary malignant neoplasm of liver and intrahepatic bile duct: Secondary | ICD-10-CM | POA: Diagnosis present

## 2020-08-29 MED ORDER — IOHEXOL 300 MG/ML  SOLN
100.0000 mL | Freq: Once | INTRAMUSCULAR | Status: AC | PRN
  Administered 2020-08-29: 100 mL via INTRAVENOUS

## 2020-09-01 ENCOUNTER — Telehealth: Payer: Self-pay | Admitting: Licensed Clinical Social Worker

## 2020-09-01 ENCOUNTER — Encounter: Payer: Self-pay | Admitting: Licensed Clinical Social Worker

## 2020-09-01 ENCOUNTER — Ambulatory Visit: Payer: Self-pay | Admitting: Licensed Clinical Social Worker

## 2020-09-01 DIAGNOSIS — Z1379 Encounter for other screening for genetic and chromosomal anomalies: Secondary | ICD-10-CM | POA: Insufficient documentation

## 2020-09-01 NOTE — Progress Notes (Signed)
HPI:  [name] was previously seen in the [facility] due to a personal and family history of cancer and concerns regarding a hereditary predisposition to cancer. Please refer to our prior cancer genetics clinic note for more information regarding our discussion, assessment and recommendations, at the time. [name] recent genetic test results were disclosed to him, as were recommendations warranted by these results. These results and recommendations are discussed in more detail below.  CANCER HISTORY:  Oncology History  Pancreatic cancer metastasized to liver (HCC)  06/01/2019 Initial Diagnosis   Pancreatic cancer metastasized to liver (HCC)   06/07/2019 - 10/31/2019 Chemotherapy   The patient had PACLitaxel-protein bound (ABRAXANE) chemo infusion 250 mg, 125 mg/m2 = 250 mg (100 % of original dose 125 mg/m2), Intravenous,  Once, 4 of 4 cycles Dose modification: 100 mg/m2 (original dose 125 mg/m2, Cycle 1, Reason: Provider Judgment), 125 mg/m2 (original dose 125 mg/m2, Cycle 1, Reason: Provider Judgment), 100 mg/m2 (original dose 125 mg/m2, Cycle 2, Reason: Dose not tolerated, Comment: neuropathy), 90 mg/m2 (original dose 125 mg/m2, Cycle 3, Reason: Dose not tolerated, Comment: neuropathy), 100 mg/m2 (original dose 125 mg/m2, Cycle 3, Reason: Dose not tolerated) Administration: 250 mg (06/07/2019), 250 mg (06/14/2019), 250 mg (06/21/2019), 200 mg (07/05/2019), 200 mg (07/12/2019), 200 mg (07/26/2019), 200 mg (08/02/2019), 200 mg (08/16/2019), 200 mg (08/31/2019) gemcitabine (GEMZAR) 2,000 mg in sodium chloride 0.9 % 250 mL chemo infusion, 2,090 mg, Intravenous,  Once, 7 of 8 cycles Dose modification: 800 mg/m2 (original dose 1,000 mg/m2, Cycle 5, Reason: Dose not tolerated), 1,000 mg/m2 (original dose 1,000 mg/m2, Cycle 5, Reason: Provider Judgment) Administration: 2,000 mg (06/07/2019), 2,000 mg (09/14/2019), 2,000 mg (09/21/2019), 2,000 mg (10/03/2019), 2,000 mg (10/10/2019), 2,000  mg (06/14/2019), 2,000 mg (07/05/2019), 1,600 mg (07/12/2019), 1,600 mg (07/26/2019), 1,600 mg (08/02/2019), 1,600 mg (08/16/2019), 1,600 mg (08/31/2019), 2,000 mg (10/24/2019), 2,000 mg (10/31/2019)  for chemotherapy treatment.    11/19/2019 - 12/31/2019 Chemotherapy   The patient had pembrolizumab (KEYTRUDA) 200 mg in sodium chloride 0.9 % 50 mL chemo infusion, 200 mg, Intravenous, Once, 3 of 6 cycles Administration: 200 mg (11/19/2019), 200 mg (12/31/2019), 200 mg (12/10/2019)  for chemotherapy treatment.    01/07/2020 - 05/29/2020 Chemotherapy   The patient had dexamethasone (DECADRON) 4 MG tablet, 8 mg, Oral, Daily, 1 of 1 cycle, Start date: 01/02/2020, End date: 01/07/2020 palonosetron (ALOXI) injection 0.25 mg, 0.25 mg, Intravenous,  Once, 11 of 12 cycles Administration: 0.25 mg (01/07/2020), 0.25 mg (01/21/2020), 0.25 mg (02/04/2020), 0.25 mg (02/18/2020), 0.25 mg (03/03/2020), 0.25 mg (03/17/2020), 0.25 mg (03/31/2020), 0.25 mg (04/15/2020), 0.25 mg (04/29/2020), 0.25 mg (05/13/2020), 0.25 mg (05/27/2020) fluorouracil (ADRUCIL) 4,550 mg in sodium chloride 0.9 % 59 mL chemo infusion, 2,400 mg/m2 = 4,550 mg, Intravenous, 1 Day/Dose, 11 of 12 cycles Dose modification: 800 mg/m2/day (original dose 800 mg/m2/day, Cycle 6) Administration: 4,550 mg (01/07/2020), 4,550 mg (01/21/2020), 4,550 mg (02/04/2020), 4,550 mg (02/18/2020), 4,550 mg (03/03/2020), 4,550 mg (03/17/2020), 4,550 mg (03/31/2020), 4,550 mg (04/15/2020), 4,550 mg (04/29/2020), 1,500 mg (03/19/2020), 4,550 mg (05/13/2020), 4,550 mg (05/27/2020) irinotecan LIPOSOME (ONIVYDE) 96 mg in sodium chloride 0.9 % 500 mL chemo infusion, 94.6 mg (100 % of original dose 50 mg/m2), Intravenous, Once, 11 of 12 cycles Dose modification: 50 mg/m2 (original dose 50 mg/m2, Cycle 1, Reason: Provider Judgment) Administration: 96 mg (01/07/2020), 129 mg (01/21/2020), 129 mg (02/04/2020), 129 mg (02/18/2020), 129 mg (03/03/2020), 129 mg (03/17/2020), 129 mg (03/31/2020), 129 mg (04/15/2020), 129 mg (04/29/2020),  129 mg (05/13/2020),  129 mg (05/27/2020) leucovorin 800 mg in sodium chloride 0.9 % 250 mL infusion, 760 mg, Intravenous,  Once, 11 of 12 cycles Administration: 800 mg (01/07/2020), 800 mg (01/21/2020), 800 mg (02/04/2020), 800 mg (02/18/2020), 800 mg (03/03/2020), 800 mg (03/17/2020), 800 mg (03/31/2020), 800 mg (04/15/2020), 800 mg (04/29/2020), 800 mg (05/13/2020), 800 mg (05/27/2020)  for chemotherapy treatment.    06/10/2020 -  Chemotherapy   The patient had palonosetron (ALOXI) injection 0.25 mg, 0.25 mg, Intravenous,  Once, 6 of 7 cycles Administration: 0.25 mg (06/10/2020), 0.25 mg (06/24/2020), 0.25 mg (07/08/2020), 0.25 mg (07/22/2020), 0.25 mg (08/05/2020), 0.25 mg (08/19/2020) leucovorin 800 mg in dextrose 5 % 250 mL infusion, 410 mg/m2 = 780 mg, Intravenous,  Once, 6 of 7 cycles Administration: 800 mg (06/10/2020), 800 mg (06/24/2020), 800 mg (07/08/2020), 800 mg (07/22/2020), 800 mg (08/05/2020) oxaliplatin (ELOXATIN) 165 mg in dextrose 5 % 500 mL chemo infusion, 85 mg/m2 = 165 mg, Intravenous,  Once, 6 of 7 cycles Dose modification: 65 mg/m2 (original dose 85 mg/m2, Cycle 6, Reason: Other (see comments), Comment: neuropathy) Administration: 165 mg (06/10/2020), 165 mg (06/24/2020), 165 mg (07/08/2020), 165 mg (07/22/2020), 165 mg (08/05/2020), 125 mg (08/19/2020) fluorouracil (ADRUCIL) 4,700 mg in sodium chloride 0.9 % 56 mL chemo infusion, 2,400 mg/m2 = 4,700 mg, Intravenous, 1 Day/Dose, 6 of 7 cycles Administration: 4,700 mg (06/10/2020), 4,700 mg (06/24/2020), 4,700 mg (07/08/2020), 4,700 mg (07/22/2020), 4,700 mg (08/05/2020), 4,700 mg (08/19/2020)  for chemotherapy treatment.     Genetic Testing   Negative genetic testing. No pathogenic variants identified on the [facility] Common Hereditary Cancers Panel + RNA. The report date is 09/01/2020.  The Common Hereditary Cancers Panel offered by [facility] includes sequencing and/or deletion duplication testing of the following 47 genes: APC, ATM, AXIN2, BARD1, BMPR1A,  BRCA1, BRCA2, BRIP1, CDH1, CDKN2A (p14ARF), CDKN2A (p16INK4a), CKD4, CHEK2, CTNNA1, DICER1, EPCAM (Deletion/duplication testing only), GREM1 (promoter region deletion/duplication testing only), KIT, MEN1, MLH1, MSH2, MSH3, MSH6, MUTYH, NBN, NF1, NHTL1, PALB2, PDGFRA, PMS2, POLD1, POLE, PTEN, RAD50, RAD51C, RAD51D, SDHB, SDHC, SDHD, SMAD4, SMARCA4. STK11, TP53, TSC1, TSC2, and VHL.  The following genes were evaluated for sequence changes only: SDHA and HOXB13 c.251G>A variant only.     FAMILY HISTORY:  We obtained a detailed, 4-generation family history.  Significant diagnoses are listed below: Family History  Problem Relation Age of Onset  . Brain cancer Mother   . Emphysema Father   . Cancer Brother    Mr. Ericson has a son and a daughter that he has no contact with. He had 5 brothers and 4 sisters, one brother passed of cancer but is unsure the type.  Mr. Teeple mother had a brain tumor, she passed over the age of 24. Patient unaware of maternal aunts/uncles who had cancer, but does believe cousins on this side of the family had cancer, unknown types. Maternal grandmother passed at 68, grandfather passed over 79.  Mr. Almquist father died in his 27s-80s, he has no information about this side of the family.  Mr. Seitzinger is unaware of previous family history of genetic testing for hereditary cancer risks. There is no reported Ashkenazi Jewish ancestry. There is no known consanguinity.     GENETIC TEST RESULTS: Genetic testing reported out on 09/01/2020 through the [facility] Common Hereditary Cancers + RNA cancer panel found no pathogenic mutations.   The Common Hereditary Cancers Panel offered by [facility] includes sequencing and/or deletion duplication testing of the following 47 genes: APC, ATM, AXIN2, BARD1, BMPR1A, BRCA1, BRCA2, BRIP1, CDH1,  CDKN2A (p14ARF), CDKN2A (p16INK4a), CKD4, CHEK2, CTNNA1, DICER1, EPCAM (Deletion/duplication testing only), GREM1 (promoter region  deletion/duplication testing only), KIT, MEN1, MLH1, MSH2, MSH3, MSH6, MUTYH, NBN, NF1, NHTL1, PALB2, PDGFRA, PMS2, POLD1, POLE, PTEN, RAD50, RAD51C, RAD51D,  SDHB, SDHC, SDHD, SMAD4, SMARCA4. STK11, TP53, TSC1, TSC2, and VHL.  The following genes were evaluated for sequence changes only: SDHA and HOXB13 c.251G>A variant only. .   The test report has been scanned into EPIC and is located under the Molecular Pathology section of the Results Review tab.  A portion of the result report is included below for reference.     We discussed with Mr. Dresch that because current genetic testing is not perfect, it is possible there may be a gene mutation in one of these genes that current testing cannot detect, but that chance is small.  We also discussed, that there could be another gene that has not yet been discovered, or that we have not yet tested, that is responsible for the cancer diagnoses in the family. It is also possible there is a hereditary cause for the cancer in the family that Mr. Chaput did not inherit and therefore was not identified in his testing.  Therefore, it is important to remain in touch with cancer genetics in the future so that we can continue to offer Mr. Strohmeier the most up to date genetic testing.   ADDITIONAL GENETIC TESTING: We discussed with Mr. Topel that his genetic testing was fairly extensive.  If there are genes identified to increase cancer risk that can be analyzed in the future, we would be happy to discuss and coordinate this testing at that time.    CANCER SCREENING RECOMMENDATIONS: Mr. Spielmann test result is considered negative (normal).  This means that we have not identified a hereditary cause for his  personal and family history of cancer at this time. Most cancers happen by chance and this negative test suggests that his cancer may fall into this category.    While reassuring, this does not definitively rule out a hereditary predisposition to cancer. It is still  possible that there could be genetic mutations that are undetectable by current technology. There could be genetic mutations in genes that have not been tested or identified to increase cancer risk.  Therefore, it is recommended he continue to follow the cancer management and screening guidelines provided by his oncology and primary healthcare provider.   An individual's cancer risk and medical management are not determined by genetic test results alone. Overall cancer risk assessment incorporates additional factors, including personal medical history, family history, and any available genetic information that may result in a personalized plan for cancer prevention and surveillance.  RECOMMENDATIONS FOR FAMILY MEMBERS:  Relatives in this family might be at some increased risk of developing cancer, over the general population risk, simply due to the family history of cancer.  We recommended male relatives in this family have a yearly mammogram beginning at age 71, or 50 years younger than the earliest onset of cancer, an annual clinical breast exam, and perform monthly breast self-exams. Male relatives in this family should also have a gynecological exam as recommended by their primary provider.  All family members should be referred for colonoscopy starting at age 49.   FOLLOW-UP: Lastly, we discussed with Mr. Harcum that cancer genetics is a rapidly advancing field and it is possible that new genetic tests will be appropriate for him and/or his family members in the future. We encouraged him to remain in  contact with cancer genetics on an annual basis so we can update his personal and family histories and let him know of advances in cancer genetics that may benefit this family.   Our contact number was provided. Mr. Vanblarcom questions were answered to his satisfaction, and he knows he is welcome to call us at anytime with additional questions or concerns.   Faith Rogue, MS, [facility] Genetic Counselor  [email or web address] Phone: [phone]

## 2020-09-01 NOTE — Telephone Encounter (Signed)
Revealed negative genetic testing.  This normal result is reassuring and indicates that it is unlikely Craig [name] cancer is due to a hereditary cause.  It is unlikely that there is an increased risk of another cancer due to a mutation in one of these genes.  However, genetic testing is not perfect, and cannot definitively rule out a hereditary cause.  It will be important for him to keep in contact with genetics to learn if any additional testing may be needed in the future.

## 2020-09-02 ENCOUNTER — Other Ambulatory Visit: Payer: Self-pay

## 2020-09-02 ENCOUNTER — Telehealth: Payer: Self-pay

## 2020-09-02 ENCOUNTER — Telehealth: Payer: Self-pay | Admitting: Pharmacy Technician

## 2020-09-02 ENCOUNTER — Telehealth: Payer: Self-pay | Admitting: Pharmacist

## 2020-09-02 ENCOUNTER — Inpatient Hospital Stay
Admit: 2020-09-02 | Discharge: 2020-09-02 | Disposition: A | Discharge status: Home / Self Care | Payer: Medicare Other | Attending: Internal Medicine | Admitting: Internal Medicine

## 2020-09-02 ENCOUNTER — Other Ambulatory Visit: Payer: Self-pay | Admitting: Oncology

## 2020-09-02 ENCOUNTER — Telehealth: Payer: Self-pay | Admitting: Oncology

## 2020-09-02 ENCOUNTER — Encounter: Payer: Self-pay | Admitting: Emergency Medicine

## 2020-09-02 ENCOUNTER — Inpatient Hospital Stay: Payer: Medicare Other

## 2020-09-02 ENCOUNTER — Emergency Department: Payer: Medicare Other

## 2020-09-02 ENCOUNTER — Inpatient Hospital Stay
Admission: EM | Admit: 2020-09-02 | Discharge: 2020-09-04 | DRG: 176 | Disposition: A | Discharge status: Home / Self Care | Payer: Medicare Other | Attending: Family Medicine | Admitting: Family Medicine

## 2020-09-02 DIAGNOSIS — I2510 Atherosclerotic heart disease of native coronary artery without angina pectoris: Secondary | ICD-10-CM | POA: Diagnosis present

## 2020-09-02 DIAGNOSIS — J9811 Atelectasis: Secondary | ICD-10-CM | POA: Diagnosis present

## 2020-09-02 DIAGNOSIS — Z7982 Long term (current) use of aspirin: Secondary | ICD-10-CM

## 2020-09-02 DIAGNOSIS — M4802 Spinal stenosis, cervical region: Secondary | ICD-10-CM | POA: Diagnosis present

## 2020-09-02 DIAGNOSIS — F32A Depression, unspecified: Secondary | ICD-10-CM | POA: Diagnosis present

## 2020-09-02 DIAGNOSIS — E1151 Type 2 diabetes mellitus with diabetic peripheral angiopathy without gangrene: Secondary | ICD-10-CM | POA: Diagnosis present

## 2020-09-02 DIAGNOSIS — Z9049 Acquired absence of other specified parts of digestive tract: Secondary | ICD-10-CM | POA: Diagnosis not present

## 2020-09-02 DIAGNOSIS — I2694 Multiple subsegmental pulmonary emboli without acute cor pulmonale: Principal | ICD-10-CM | POA: Diagnosis present

## 2020-09-02 DIAGNOSIS — K219 Gastro-esophageal reflux disease without esophagitis: Secondary | ICD-10-CM | POA: Diagnosis present

## 2020-09-02 DIAGNOSIS — Z808 Family history of malignant neoplasm of other organs or systems: Secondary | ICD-10-CM | POA: Diagnosis not present

## 2020-09-02 DIAGNOSIS — T451X5A Adverse effect of antineoplastic and immunosuppressive drugs, initial encounter: Secondary | ICD-10-CM | POA: Diagnosis present

## 2020-09-02 DIAGNOSIS — D6481 Anemia due to antineoplastic chemotherapy: Secondary | ICD-10-CM | POA: Diagnosis present

## 2020-09-02 DIAGNOSIS — C787 Secondary malignant neoplasm of liver and intrahepatic bile duct: Secondary | ICD-10-CM

## 2020-09-02 DIAGNOSIS — Z20822 Contact with and (suspected) exposure to covid-19: Secondary | ICD-10-CM | POA: Diagnosis present

## 2020-09-02 DIAGNOSIS — Z79899 Other long term (current) drug therapy: Secondary | ICD-10-CM | POA: Diagnosis not present

## 2020-09-02 DIAGNOSIS — D649 Anemia, unspecified: Secondary | ICD-10-CM | POA: Diagnosis present

## 2020-09-02 DIAGNOSIS — C259 Malignant neoplasm of pancreas, unspecified: Secondary | ICD-10-CM

## 2020-09-02 DIAGNOSIS — I2699 Other pulmonary embolism without acute cor pulmonale: Secondary | ICD-10-CM | POA: Diagnosis present

## 2020-09-02 DIAGNOSIS — Z888 Allergy status to other drugs, medicaments and biological substances status: Secondary | ICD-10-CM | POA: Diagnosis not present

## 2020-09-02 DIAGNOSIS — M5030 Other cervical disc degeneration, unspecified cervical region: Secondary | ICD-10-CM | POA: Diagnosis present

## 2020-09-02 DIAGNOSIS — E785 Hyperlipidemia, unspecified: Secondary | ICD-10-CM | POA: Diagnosis present

## 2020-09-02 DIAGNOSIS — I5022 Chronic systolic (congestive) heart failure: Secondary | ICD-10-CM | POA: Diagnosis present

## 2020-09-02 DIAGNOSIS — Z87891 Personal history of nicotine dependence: Secondary | ICD-10-CM

## 2020-09-02 DIAGNOSIS — Z825 Family history of asthma and other chronic lower respiratory diseases: Secondary | ICD-10-CM | POA: Diagnosis not present

## 2020-09-02 DIAGNOSIS — I10 Essential (primary) hypertension: Secondary | ICD-10-CM | POA: Diagnosis not present

## 2020-09-02 DIAGNOSIS — Z66 Do not resuscitate: Secondary | ICD-10-CM | POA: Diagnosis present

## 2020-09-02 DIAGNOSIS — Z951 Presence of aortocoronary bypass graft: Secondary | ICD-10-CM | POA: Diagnosis not present

## 2020-09-02 DIAGNOSIS — E119 Type 2 diabetes mellitus without complications: Secondary | ICD-10-CM

## 2020-09-02 DIAGNOSIS — I110 Hypertensive heart disease with heart failure: Secondary | ICD-10-CM | POA: Diagnosis present

## 2020-09-02 DIAGNOSIS — E1140 Type 2 diabetes mellitus with diabetic neuropathy, unspecified: Secondary | ICD-10-CM | POA: Diagnosis present

## 2020-09-02 DIAGNOSIS — M109 Gout, unspecified: Secondary | ICD-10-CM | POA: Diagnosis present

## 2020-09-02 DIAGNOSIS — Z794 Long term (current) use of insulin: Secondary | ICD-10-CM

## 2020-09-02 LAB — CBC WITH DIFFERENTIAL/PLATELET
Abs Immature Granulocytes: 0.04 10*3/uL (ref 0.00–0.07)
Basophils Absolute: 0 10*3/uL (ref 0.0–0.1)
Basophils Relative: 1 %
Eosinophils Absolute: 0.1 10*3/uL (ref 0.0–0.5)
Eosinophils Relative: 2 %
HCT: 31.1 % — ABNORMAL LOW (ref 39.0–52.0)
Hemoglobin: 9.6 g/dL — ABNORMAL LOW (ref 13.0–17.0)
Immature Granulocytes: 1 %
Lymphocytes Relative: 15 %
Lymphs Abs: 1.1 10*3/uL (ref 0.7–4.0)
MCH: 29 pg (ref 26.0–34.0)
MCHC: 30.9 g/dL (ref 30.0–36.0)
MCV: 94 fL (ref 80.0–100.0)
Monocytes Absolute: 1.3 10*3/uL — ABNORMAL HIGH (ref 0.1–1.0)
Monocytes Relative: 17 %
Neutro Abs: 4.9 10*3/uL (ref 1.7–7.7)
Neutrophils Relative %: 64 %
Platelets: 200 10*3/uL (ref 150–400)
RBC: 3.31 MIL/uL — ABNORMAL LOW (ref 4.22–5.81)
RDW: 18.2 % — ABNORMAL HIGH (ref 11.5–15.5)
WBC: 7.5 10*3/uL (ref 4.0–10.5)
nRBC: 0 % (ref 0.0–0.2)

## 2020-09-02 LAB — COMPREHENSIVE METABOLIC PANEL
ALT: 22 U/L (ref 0–44)
AST: 39 U/L (ref 15–41)
Albumin: 3 g/dL — ABNORMAL LOW (ref 3.5–5.0)
Alkaline Phosphatase: 174 U/L — ABNORMAL HIGH (ref 38–126)
Anion gap: 12 (ref 5–15)
BUN: 14 mg/dL (ref 8–23)
CO2: 22 mmol/L (ref 22–32)
Calcium: 9.1 mg/dL (ref 8.9–10.3)
Chloride: 104 mmol/L (ref 98–111)
Creatinine, Ser: 0.91 mg/dL (ref 0.61–1.24)
GFR, Estimated: >60 mL/min (ref >60)
Glucose, Bld: 127 mg/dL — ABNORMAL HIGH (ref 70–99)
Potassium: 3.5 mmol/L (ref 3.5–5.1)
Sodium: 138 mmol/L (ref 135–145)
Total Bilirubin: 1 mg/dL (ref 0.3–1.2)
Total Protein: 6.6 g/dL (ref 6.5–8.1)

## 2020-09-02 LAB — APTT: aPTT: 35 seconds (ref 24–36)

## 2020-09-02 LAB — RESP PANEL BY RT-PCR (FLU A&B, COVID) ARPGX2
Influenza A by PCR: NEGATIVE
Influenza B by PCR: NEGATIVE
SARS Coronavirus 2 by RT PCR: NEGATIVE

## 2020-09-02 LAB — BRAIN NATRIURETIC PEPTIDE: B Natriuretic Peptide: 161.9 pg/mL — ABNORMAL HIGH (ref 0.0–100.0)

## 2020-09-02 LAB — PROTIME-INR
INR: 1.1 (ref 0.8–1.2)
Prothrombin Time: 13.7 seconds (ref 11.4–15.2)

## 2020-09-02 LAB — GLUCOSE, CAPILLARY
Glucose-Capillary: 89 mg/dL (ref 70–99)
Glucose-Capillary: 95 mg/dL (ref 70–99)

## 2020-09-02 LAB — MAGNESIUM: Magnesium: 1.8 mg/dL (ref 1.7–2.4)

## 2020-09-02 LAB — HEPARIN LEVEL (UNFRACTIONATED)
Heparin Unfractionated: 0.66 IU/mL (ref 0.30–0.70)
Heparin Unfractionated: 0.59 IU/mL (ref 0.30–0.70)

## 2020-09-02 LAB — CBG MONITORING, ED: Glucose-Capillary: 113 mg/dL — ABNORMAL HIGH (ref 70–99)

## 2020-09-02 LAB — TROPONIN I (HIGH SENSITIVITY)
Troponin I (High Sensitivity): 18 ng/L — ABNORMAL HIGH (ref <18)
Troponin I (High Sensitivity): 15 ng/L (ref <18)

## 2020-09-02 MED ORDER — INSULIN ASPART 100 UNIT/ML ~~LOC~~ SOLN: 0.0000 [IU] | Freq: Every day | SUBCUTANEOUS | Status: DC

## 2020-09-02 MED ORDER — ONDANSETRON HCL 4 MG/2ML IJ SOLN: 4.0000 mg | Freq: Three times a day (TID) | INTRAMUSCULAR | Status: DC | PRN

## 2020-09-02 MED ORDER — HYDRALAZINE HCL 20 MG/ML IJ SOLN
5.0000 mg | INTRAMUSCULAR | Status: DC | PRN
  Filled 2020-09-02: qty 1

## 2020-09-02 MED ORDER — CARVEDILOL 6.25 MG PO TABS
6.2500 mg | ORAL_TABLET | Freq: Two times a day (BID) | ORAL | Status: DC
  Administered 2020-09-02 – 2020-09-04 (×4): 6.25 mg via ORAL
  Filled 2020-09-02 (×4): qty 1

## 2020-09-02 MED ORDER — DILTIAZEM HCL ER COATED BEADS 180 MG PO CP24
300.0000 mg | ORAL_CAPSULE | Freq: Every day | ORAL | Status: DC
  Administered 2020-09-02 – 2020-09-04 (×3): 300 mg via ORAL
  Filled 2020-09-02 (×3): qty 1

## 2020-09-02 MED ORDER — HEPARIN (PORCINE) 25000 UT/250ML-% IV SOLN
1200.0000 [IU]/h | INTRAVENOUS | Status: AC
  Administered 2020-09-02 – 2020-09-03 (×3): 1200 [IU]/h via INTRAVENOUS
  Filled 2020-09-02 (×3): qty 250

## 2020-09-02 MED ORDER — DORZOLAMIDE HCL-TIMOLOL MAL 2-0.5 % OP SOLN
1.0000 [drp] | Freq: Two times a day (BID) | OPHTHALMIC | Status: DC
  Administered 2020-09-02 – 2020-09-04 (×5): 1 [drp] via OPHTHALMIC
  Filled 2020-09-02: qty 10

## 2020-09-02 MED ORDER — CAPECITABINE 500 MG PO TABS: 1500.0000 mg | ORAL_TABLET | Freq: Two times a day (BID) | ORAL | Status: DC

## 2020-09-02 MED ORDER — ASPIRIN EC 81 MG PO TBEC
81.0000 mg | DELAYED_RELEASE_TABLET | Freq: Every day | ORAL | Status: DC
  Administered 2020-09-02 – 2020-09-04 (×3): 81 mg via ORAL
  Filled 2020-09-02 (×3): qty 1

## 2020-09-02 MED ORDER — INSULIN GLARGINE 100 UNIT/ML ~~LOC~~ SOLN
10.0000 [IU] | Freq: Every day | SUBCUTANEOUS | Status: DC
  Filled 2020-09-02 (×3): qty 0.1

## 2020-09-02 MED ORDER — LATANOPROST 0.005 % OP SOLN
1.0000 [drp] | Freq: Every day | OPHTHALMIC | Status: DC
  Administered 2020-09-02 – 2020-09-03 (×2): 1 [drp] via OPHTHALMIC
  Filled 2020-09-02: qty 2.5

## 2020-09-02 MED ORDER — ALBUTEROL SULFATE (2.5 MG/3ML) 0.083% IN NEBU: 2.5000 mg | INHALATION_SOLUTION | RESPIRATORY_TRACT | Status: DC | PRN

## 2020-09-02 MED ORDER — INSULIN ASPART 100 UNIT/ML ~~LOC~~ SOLN
0.0000 [IU] | Freq: Three times a day (TID) | SUBCUTANEOUS | Status: DC
  Administered 2020-09-03: 1 [IU] via SUBCUTANEOUS
  Filled 2020-09-02: qty 1

## 2020-09-02 MED ORDER — OXYCODONE-ACETAMINOPHEN 5-325 MG PO TABS: 1.0000 | ORAL_TABLET | Freq: Four times a day (QID) | ORAL | Status: DC | PRN

## 2020-09-02 MED ORDER — GABAPENTIN 300 MG PO CAPS
300.0000 mg | ORAL_CAPSULE | Freq: Three times a day (TID) | ORAL | Status: DC
  Administered 2020-09-02 – 2020-09-04 (×7): 300 mg via ORAL
  Filled 2020-09-02 (×7): qty 1

## 2020-09-02 MED ORDER — ROSUVASTATIN CALCIUM 10 MG PO TABS
10.0000 mg | ORAL_TABLET | Freq: Every day | ORAL | Status: DC
  Administered 2020-09-02 – 2020-09-04 (×3): 10 mg via ORAL
  Filled 2020-09-02 (×3): qty 1

## 2020-09-02 MED ORDER — DULOXETINE HCL 30 MG PO CPEP
60.0000 mg | ORAL_CAPSULE | Freq: Every day | ORAL | Status: DC
  Administered 2020-09-02 – 2020-09-04 (×3): 60 mg via ORAL
  Filled 2020-09-02: qty 1
  Filled 2020-09-02 (×2): qty 2

## 2020-09-02 MED ORDER — PANTOPRAZOLE SODIUM 40 MG PO TBEC
40.0000 mg | DELAYED_RELEASE_TABLET | Freq: Every day | ORAL | Status: DC
  Administered 2020-09-03 – 2020-09-04 (×2): 40 mg via ORAL
  Filled 2020-09-02 (×3): qty 1

## 2020-09-02 MED ORDER — DM-GUAIFENESIN ER 30-600 MG PO TB12: 1.0000 | ORAL_TABLET | Freq: Two times a day (BID) | ORAL | Status: DC | PRN

## 2020-09-02 MED ORDER — CAPECITABINE 500 MG PO TABS: 1500.0000 mg | ORAL_TABLET | Freq: Two times a day (BID) | ORAL | 0 refills | Status: DC

## 2020-09-02 MED ORDER — CAPECITABINE 500 MG PO TABS: 750.0000 mg/m2 | ORAL_TABLET | Freq: Two times a day (BID) | ORAL | 0 refills | Status: DC

## 2020-09-02 MED ORDER — DOCUSATE SODIUM 100 MG PO CAPS
100.0000 mg | ORAL_CAPSULE | Freq: Two times a day (BID) | ORAL | Status: DC
  Administered 2020-09-02 – 2020-09-04 (×4): 100 mg via ORAL
  Filled 2020-09-02 (×5): qty 1

## 2020-09-02 MED ORDER — FERROUS SULFATE 325 (65 FE) MG PO TABS
325.0000 mg | ORAL_TABLET | Freq: Every day | ORAL | Status: DC
  Administered 2020-09-03 – 2020-09-04 (×2): 325 mg via ORAL
  Filled 2020-09-02 (×2): qty 1

## 2020-09-02 MED ORDER — TRIAMCINOLONE ACETONIDE 0.5 % EX OINT
1.0000 "application " | TOPICAL_OINTMENT | Freq: Three times a day (TID) | CUTANEOUS | Status: DC
  Administered 2020-09-02: 1 via TOPICAL
  Filled 2020-09-02: qty 15

## 2020-09-02 MED ORDER — ACETAMINOPHEN 325 MG PO TABS: 650.0000 mg | ORAL_TABLET | Freq: Four times a day (QID) | ORAL | Status: DC | PRN

## 2020-09-02 MED ORDER — HEPARIN BOLUS VIA INFUSION
5000.0000 [IU] | Freq: Once | INTRAVENOUS | Status: AC
  Administered 2020-09-02: 5000 [IU] via INTRAVENOUS
  Filled 2020-09-02: qty 5000

## 2020-09-02 MED ORDER — ALBUTEROL SULFATE HFA 108 (90 BASE) MCG/ACT IN AERS: 2.0000 | INHALATION_SPRAY | RESPIRATORY_TRACT | Status: DC | PRN

## 2020-09-02 MED ORDER — OMEGA-3-ACID ETHYL ESTERS 1 G PO CAPS
1.0000 g | ORAL_CAPSULE | Freq: Every day | ORAL | Status: DC
  Administered 2020-09-03 – 2020-09-04 (×2): 1 g via ORAL
  Filled 2020-09-02 (×2): qty 1

## 2020-09-02 MED ORDER — ADULT MULTIVITAMIN W/MINERALS CH
1.0000 | ORAL_TABLET | Freq: Every day | ORAL | Status: DC
  Administered 2020-09-02 – 2020-09-04 (×3): 1 via ORAL
  Filled 2020-09-02 (×3): qty 1

## 2020-09-02 MED ORDER — TRIAMCINOLONE ACETONIDE 0.5 % EX OINT
1.0000 "application " | TOPICAL_OINTMENT | Freq: Three times a day (TID) | CUTANEOUS | Status: DC | PRN
  Filled 2020-09-02: qty 15

## 2020-09-02 MED FILL — CAPECITABINE 500 MG TABLET: 500 | 21 days supply | Qty: 84 | Fill #0

## 2020-09-02 NOTE — ED Triage Notes (Addendum)
Patient ambulatory to triage with steady gait, without difficulty or distress noted; pt reports currently receiving chemo for pancreatic CA, portacath in place; was called by MD to come to ED regarding abnormal CT scan (bilat PEs); pt denies c/o at present; st went to his MD due to [facility] last wk

## 2020-09-02 NOTE — Telephone Encounter (Signed)
Oral Oncology Patient Advocate Encounter  After completing a benefits investigation, prior authorization for Xeloda (Capecitabine) is not required at this time through [facility].  Medication was processed under patients Part B coverage.  Patient's copay is $0.00.  [place] [facility] [place] Phone [phone] Fax [phone] 09/02/2020 8:48 AM

## 2020-09-02 NOTE — Progress Notes (Signed)
DISCONTINUE ON PATHWAY REGIMEN - Pancreatic Adenocarcinoma     A cycle is every 14 days:     Oxaliplatin      Leucovorin      Fluorouracil      Fluorouracil   **Always confirm dose/schedule in your pharmacy ordering system**  REASON: Disease Progression PRIOR TREATMENT: PANOS39: FOLFOX q14 Days Until Progression or Toxicity TREATMENT RESPONSE: Progressive Disease (PD)  START OFF PATHWAY REGIMEN - Pancreatic Adenocarcinoma   [id number]:Docetaxel 100 mg/m2 IV D8 + Gemcitabine 900 mg/m2 IV D1,8 + G-CSF q21 Days:   A cycle is every 21 days:     Gemcitabine      Docetaxel      [id number]   **Always confirm dose/schedule in your pharmacy ordering system**  Patient Characteristics: Metastatic Disease, Third Line and Beyond, MSS/pMMR or MSI Unknown Therapeutic Status: Metastatic Disease Line of Therapy: Third Engineer, civil (consulting) Status: Unknown Intent of Therapy: Non-Curative / Palliative Intent, Discussed with Patient

## 2020-09-02 NOTE — H&P (Addendum)
History and Physical    Craig [name] FYB:[id number] DOB: [birth year] DOA: 09/02/2020  Referring MD/NP/PA:   PCP: Trinna Post, PA-C   Patient coming from:  The patient is coming from home.  At baseline, pt is independent for most of ADL.        Chief Complaint: SOB and abnormal findiing of CT scan  HPI: Craig [name] is a 74 y.o. 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  Patient states that he had shortness of breath last week, and was seen by his oncologist, Dr. Tasia Catchings, who did CT scan of chest/abd/pelvis on 11/19. The results of CT showed bilateral pulmonary embolism.  Patient was instructed to come to hospital for further evaluation and treatment. Currently patient denies any chest pain, shortness breath, cough.  No fever or chills.  Denies nausea, vomiting, diarrhea or abdominal pain.  No symptoms of UTI.  Patient denies recent fall.  No dark stool or rectal bleeding.  No tenderness in the calf areas.  CT-chest/Abd/pevsion 08/29/20: 1. Bilateral pulmonary emboli. No evidence of right heart strain. 2. Progressive hepatic metastatic disease. 3. Slight interval decrease in size of the pancreatic head mass. 4. Stable advanced atherosclerotic calcifications involving the thoracic and abdominal aorta and branch vessels including the coronary arteries. 5. Stable pulmonary nodules. 6. Aortic atherosclerosis.  ED Course: pt was found to have WBC 7.5, negative Covid PCR, electrolytes renal function okay, INR 1.1, PTT 35, BNP 161.  Temperature normal, blood pressure 149/86, heart rate 86, RR 26, oxygen saturation 96-100% on room air.  Chest x-ray negative.  Patient is admitted to progressive  bed as inpatient by accepting MD.  Review of Systems:   General: no fevers, chills, no body weight gain, fatigue HEENT: no blurry vision, hearing changes or sore throat Respiratory: had dyspnea, no coughing, wheezing CV: no chest pain, no palpitations GI: no nausea, vomiting, abdominal pain, diarrhea, constipation GU: no dysuria, burning on urination, increased urinary frequency, hematuria  Ext: no leg edema Neuro: no unilateral weakness, numbness, or tingling, no vision change or hearing loss Skin: no rash, no skin tear. MSK: No muscle spasm, no deformity, no limitation of range of movement in spin Heme: No easy bruising.  Travel history: No recent long distant travel.  Allergy:  Allergies  Allergen Reactions  . Lipitor [Atorvastatin] Other (See Comments)    MYALGIA  . Zetia [Ezetimibe] Other (See Comments)    MYALGIA  . Lisinopril Cough  . Protonix [Pantoprazole] Other (See Comments)    headache  . Spironolactone Other (See Comments)    Breast tenderness    Past Medical History:  Diagnosis Date  . Allergic rhinitis   . CHF (congestive heart failure) ([place])   . Chronic cholecystitis   . Coronary artery disease   . DDD (degenerative disc disease), cervical   . Dehydration 06/21/2019  . Diabetes mellitus without complication ([place])   . Dyspnea   . Early cataract   . Erectile dysfunction   . Family history of brain cancer   . Family history of cancer   . GERD (gastroesophageal reflux disease)   . Gouty arthritis   . Headache    occasional migraines  . Hypercalcemia   . Hyperlipemia   . Hypertension   . Palpitations   .  Pancreatic cancer metastasized to liver ([place]) 05/2019   Chemo tx's  . Peripheral vascular disease ([place])   . 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 Spinal stenosis of cervical region   . Vitamin D deficiency     Past Surgical History:  Procedure Laterality Date  .  BACK SURGERY  2018    fusion with screws  . CHOLECYSTECTOMY N/A 11/13/2018   Procedure: LAPAROSCOPIC CHOLECYSTECTOMY;  Surgeon: Vickie Epley, MD;  Location: [facility];  Service: General;  Laterality: N/A;  . COLONOSCOPY    . CORONARY ARTERY BYPASS GRAFT N/A 09/30/2017   Procedure: CORONARY ARTERY BYPASS GRAFTING times five using right and left Saphaneous vein harvested endoscopicly  and left internal mammary artery. (CABG),TEE;  Surgeon: Rexene Alberts, MD;  Location: [place];  Service: Open Heart Surgery;  Laterality: N/A;  . LEFT HEART CATH AND CORONARY ANGIOGRAPHY Left 09/06/2017   Procedure: LEFT HEART CATH AND CORONARY ANGIOGRAPHY;  Surgeon: Corey Skains, MD;  Location: [facility];  Service: Cardiovascular;  Laterality: Left;  . percutaneous transluminal balloon angioplasty  01/2010   of left lower extremity  . PORTA CATH INSERTION N/A 06/06/2019   Procedure: PORTA CATH INSERTION;  Surgeon: Katha Cabal, MD;  Location: [facility];  Service: Cardiovascular;  Laterality: N/A;  . TEE WITHOUT CARDIOVERSION N/A 09/30/2017   Procedure: TRANSESOPHAGEAL ECHOCARDIOGRAM (TEE);  Surgeon: Rexene Alberts, MD;  Location: [facility];  Service: Open Heart Surgery;  Laterality: N/A;  . VASCULAR SURGERY Left 2010   left exernal iliac and superficial femoral artery PTCA and stenting    Social History:  reports that he quit smoking about 2 years ago. His smoking use included cigarettes. He has a 37.50 pack-year smoking history. He has quit using smokeless tobacco.  His smokeless tobacco use included chew. He reports previous alcohol use. He reports previous drug use. Drug: Marijuana.  Family History:  Family History  Problem Relation Age of Onset  . Brain cancer Mother   . Emphysema Father   . Cancer Brother      Prior to Admission medications   Medication Sig Start Date End Date Taking? Authorizing Provider  aspirin EC 81 MG tablet Take 81 mg by mouth daily.   Yes  [provider]  carvedilol (COREG) 6.25 MG tablet Take 6.25 mg by mouth 2 (two) times daily with a meal. 03/01/18  Yes [provider]  diltiazem (CARDIZEM CD) 300 MG 24 hr capsule TAKE 1 CAPSULE BY MOUTH EVERY DAY 07/06/20  Yes Carles Collet M, PA-C  docusate sodium (COLACE) 100 MG capsule Take 1 capsule (100 mg total) by mouth 2 (two) times daily. 08/25/20  Yes Flinchum, Kelby Aline, FNP  dorzolamide-timolol (COSOPT) 22.3-6.8 MG/ML ophthalmic solution INSTILL ONE DROP INTO BOTH EYES TWICE DAILY 11/08/18  Yes [provider]  DULoxetine (CYMBALTA) 30 MG capsule TAKE 2 CAPSULES BY MOUTH EVERY DAY 06/24/20  Yes Earlie Server, MD  ferrous sulfate 325 (65 FE) MG tablet TAKE 1 TABLET BY MOUTH EVERY DAY WITH BREAKFAST 06/13/20  Yes Carles Collet M, PA-C  gabapentin (NEURONTIN) 300 MG capsule Take 300 mg by mouth 3 (three) times daily.   Yes [provider]  insulin glargine (LANTUS SOLOSTAR) 100 UNIT/ML Solostar Pen Inject 20 Units into the skin at bedtime. 01/26/20  Yes Debbe Odea, MD  latanoprost (XALATAN) 0.005 % ophthalmic solution Place  1 drop into both eyes at bedtime.  07/15/18  Yes [provider]  Multiple Vitamin (MULTIVITAMIN WITH MINERALS) TABS tablet Take 1 tablet by mouth daily.    Yes [provider]  OMEGA-3 FATTY ACIDS PO Take 1,200 mg by mouth daily.    Yes [provider]  omeprazole (PRILOSEC) 40 MG capsule TAKE 1 CAPSULE BY MOUTH EVERY DAY 06/10/20  Yes Pollak, Wendee Beavers, PA-C  ONETOUCH VERIO test strip TEST FASTING SUGAR EVERY MORNING 02/09/20  Yes Carles Collet M, PA-C  rosuvastatin (CRESTOR) 10 MG tablet Take 1 tablet by mouth daily. 04/27/19  Yes [provider]  triamcinolone ointment (KENALOG) 0.5 % APPLY 1 APPLICATION TOPICALLY 3 (THREE) TIMES DAILY. 04/28/20  Yes Burns, Wandra Feinstein, NP  capecitabine (XELODA) 500 MG tablet Take 3 tablets (1,500 mg total) by mouth 2 (two) times daily after a meal. Take for 14 days on,  7 days off. Repeat every 21 days. Patient not taking: Reported on 09/02/2020 09/02/20   Earlie Server, MD  insulin lispro (HUMALOG) 100 UNIT/ML KwikPen Inject 0.05 mLs (5 Units total) into the skin 3 (three) times daily with meals. Patient not taking: Reported on 09/02/2020 01/26/20   Debbe Odea, MD  Insulin Pen Needle (PEN NEEDLES) 32G X 5 MM MISC Use four times daily for insulin Patient not taking: Reported on 09/02/2020 01/30/20   Trinna Post, PA-C  lidocaine-prilocaine (EMLA) cream APPLY TO AFFECTED AREA ONCE AS DIRECTED 08/24/19   [provider]    Physical Exam: Vitals:   09/02/20 0630 09/02/20 0700 09/02/20 0715 09/02/20 0735  BP: (!) 160/93 136/87 140/89 (!) 149/86  Pulse: 91 81 82 86  Resp: 17 16 16  (!) 26  Temp:    98 F (36.7 C)  TempSrc:    Oral  SpO2: 96% 99% 100% 100%  Weight:      Height:       General: Not in acute distress HEENT:       Eyes: PERRL, EOMI, no scleral icterus.       ENT: No discharge from the ears and nose, no pharynx injection, no tonsillar enlargement.        Neck: No JVD, no bruit, no mass felt. Heme: No neck lymph node enlargement. Cardiac: S1/S2, RRR, No murmurs, No gallops or rubs. Respiratory: No rales, wheezing, rhonchi or rubs. GI: Soft, nondistended, nontender, no rebound pain, no organomegaly, BS present. GU: No hematuria Ext: No pitting leg edema bilaterally. 1+DP/PT pulse bilaterally. Musculoskeletal: No joint deformities, No joint redness or warmth, no limitation of ROM in spin. Skin: No rashes.  Neuro: Alert, oriented X3, cranial nerves II-XII grossly intact, moves all extremities normally.  Psych: Patient is not psychotic, no suicidal or hemocidal ideation.  Labs on Admission: I have personally reviewed following labs and imaging studies  CBC: Recent Labs  Lab 09/02/20 0237  WBC 7.5  NEUTROABS 4.9  HGB 9.6*  HCT 31.1*  MCV 94.0  PLT 976   Basic Metabolic Panel: Recent Labs  Lab 09/02/20 0237  NA 138  K  3.5  CL 104  CO2 22  GLUCOSE 127*  BUN 14  CREATININE 0.91  CALCIUM 9.1  MG 1.8   GFR: Estimated Creatinine Clearance: 72.3 mL/min (by C-G formula based on SCr of 0.91 mg/dL). Liver Function Tests: Recent Labs  Lab 09/02/20 0237  AST 39  ALT 22  ALKPHOS 174*  BILITOT 1.0  PROT 6.6  ALBUMIN 3.0*   No results for input(s): LIPASE, AMYLASE in  the last 168 hours. No results for input(s): AMMONIA in the last 168 hours. Coagulation Profile: Recent Labs  Lab 09/02/20 0237  INR 1.1   Cardiac Enzymes: No results for input(s): CKTOTAL, CKMB, CKMBINDEX, TROPONINI in the last 168 hours. BNP (last 3 results) No results for input(s): PROBNP in the last 8760 hours. HbA1C: No results for input(s): HGBA1C in the last 72 hours. CBG: No results for input(s): GLUCAP in the last 168 hours. Lipid Profile: No results for input(s): CHOL, HDL, LDLCALC, TRIG, CHOLHDL, LDLDIRECT in the last 72 hours. Thyroid Function Tests: No results for input(s): TSH, T4TOTAL, FREET4, T3FREE, THYROIDAB in the last 72 hours. Anemia Panel: No results for input(s): VITAMINB12, FOLATE, FERRITIN, TIBC, IRON, RETICCTPCT in the last 72 hours. Urine analysis:    Component Value Date/Time   COLORURINE STRAW (A) 01/20/2020 1814   APPEARANCEUR CLEAR (A) 01/20/2020 1814   LABSPEC 1.032 (H) 01/20/2020 1814   PHURINE 6.0 01/20/2020 1814   GLUCOSEU >=500 (A) 01/20/2020 1814   HGBUR NEGATIVE 01/20/2020 1814   BILIRUBINUR neg 01/24/2020 1223   KETONESUR NEGATIVE 01/20/2020 1814   PROTEINUR Negative 01/24/2020 1223   PROTEINUR NEGATIVE 01/20/2020 1814   UROBILINOGEN 0.2 01/24/2020 1223   NITRITE neg 01/24/2020 1223   NITRITE NEGATIVE 01/20/2020 1814   LEUKOCYTESUR Negative 01/24/2020 1223   LEUKOCYTESUR NEGATIVE 01/20/2020 1814   Sepsis Labs: @LABRCNTIP (procalcitonin:4,lacticidven:4) ) Recent Results (from the past 240 hour(s))  Resp Panel by RT-PCR (Flu A&B, Covid) Nasopharyngeal Swab     Status: None    Collection Time: 09/02/20  2:37 AM   Specimen: Nasopharyngeal Swab; Nasopharyngeal(NP) swabs in vial transport medium  Result Value Ref Range Status   SARS Coronavirus 2 by RT PCR NEGATIVE NEGATIVE Final    Comment: (NOTE) SARS-CoV-2 target nucleic acids are NOT DETECTED.  The SARS-CoV-2 RNA is generally detectable in upper respiratory specimens during the acute phase of infection. The lowest concentration of SARS-CoV-2 viral copies this assay can detect is 138 copies/mL. A negative result does not preclude SARS-Cov-2 infection and should not be used as the sole basis for treatment or other patient management decisions. A negative result may occur with  improper specimen collection/handling, submission of specimen other than nasopharyngeal swab, presence of viral mutation(s) within the areas targeted by this assay, and inadequate number of viral copies(<138 copies/mL). A negative result must be combined with clinical observations, patient history, and epidemiological information. The expected result is Negative.  Fact Sheet for Patients:  EntrepreneurPulse.com.au  Fact Sheet for Healthcare Providers:  IncredibleEmployment.be  This test is no t yet approved or cleared by the [place] FDA and  has been authorized for detection and/or diagnosis of SARS-CoV-2 by FDA under an Emergency Use Authorization (EUA). This EUA will remain  in effect (meaning this test can be used) for the duration of the COVID-19 declaration under Section 564(b)(1) of the Act, 21 U.S.C.section 360bbb-3(b)(1), unless the authorization is terminated  or revoked sooner.       Influenza A by PCR NEGATIVE NEGATIVE Final   Influenza B by PCR NEGATIVE NEGATIVE Final    Comment: (NOTE) The Xpert Xpress SARS-CoV-2/FLU/RSV plus assay is intended as an aid in the diagnosis of influenza from Nasopharyngeal swab specimens and should not be used as a sole basis for treatment.  Nasal washings and aspirates are unacceptable for Xpert Xpress SARS-CoV-2/FLU/RSV testing.  Fact Sheet for Patients: EntrepreneurPulse.com.au  Fact Sheet for Healthcare Providers: IncredibleEmployment.be  This test is not yet approved or cleared by the [place]  States FDA and has been authorized for detection and/or diagnosis of SARS-CoV-2 by FDA under an Emergency Use Authorization (EUA). This EUA will remain in effect (meaning this test can be used) for the duration of the COVID-19 declaration under Section 564(b)(1) of the Act, 21 U.S.C. section 360bbb-3(b)(1), unless the authorization is terminated or revoked.  Performed at [facility], [address], [place], [place] [address]      Radiological Exams on Admission: DG Chest 2 View  Result Date: 09/02/2020 CLINICAL DATA:  Tachycardia EXAM: CHEST - 2 VIEW COMPARISON:  None. FINDINGS: The heart size and mediastinal contours are within normal limits. Both lungs are clear. The visualized skeletal structures are unremarkable. Right chest wall Port-A-Cath with tip at the cavoatrial junction. IMPRESSION: No active cardiopulmonary disease. Electronically Signed   By: Ulyses Jarred M.D.   On: 09/02/2020 03:28     EKG: I have personally reviewed.  Sinus rhythm, QTC 432, early R wave progression, no ischemic change.  Assessment/Plan Principal Problem:   Pulmonary embolism (HCC) Active Problems:   GERD (gastroesophageal reflux disease)   HLD (hyperlipidemia)   Essential (primary) hypertension   S/P CABG x 5   CAD (coronary artery disease)   Pancreatic cancer metastasized to liver ([place])   Anemia due to antineoplastic chemotherapy   Diabetes mellitus without complication (HCC)   Depression   Chronic systolic CHF (congestive heart failure) ([place])   Pulmonary embolism [facility]): CT scan showed bilateral pulmonary emboli. No evidence of right heart strain.  Currently patient is asymptomatic.   His oxygen saturation 96-100% on room air.  -Patient is admitted to progressive bed as inpatient by accepting MD. -heparin drip initiated -2D echocardiogram ordered -LE dopplers ordered to evaluate for DVT -pain control: When necessary Percocet -prn albuterol nebs and mucinex   GERD (gastroesophageal reflux disease) -Protonix  HLD (hyperlipidemia) -Crestor  Essential (primary) hypertension -IV hydralazine as needed -Coreg, Cardizem,  CAD and S/P CABG x 5: No chest pain  -Continue aspirin, Crestor and Coreg  Pancreatic cancer metastasized to liver [facility]): Currently on chemotherapy, last dose was 2 weeks ago, following up with Dr. Tasia Catchings of oncolog. Pt is supposed to start Xeloda today.  -Follow-up with Dr. Tasia Catchings  Anemia due to antineoplastic chemotherapy: Hemoglobin 9.6 today -Continue iron supplement  Diabetes mellitus without complication [facility]): Recent A1c 10.7, poorly controlled.  Taking Humalog and Lantus -Sliding scale insulin -Decrease Lantus dose from 20 to 10 units daily  Depression -Cymbalta  Chronic systolic CHF (congestive heart failure) ([place]): 2D echo on 09/30/17 showed EF 35-40%.  Patient does not have leg edema.  No respiratory distress.  No pulmonary edema by chest x-ray.  BNP 161.  CHF seem to be compensated.  Patient is not taking Lasix currently. -Watch volume status closely     DVT ppx: on IV Heparin Code Status: DNR (I discussed with the patient about CODE STATUS, and explained the meaning of CODE STATUS, he wants to be DNR.  I also talked to his wife by phone, who confirmed that patient is DNR)  Family Communication:  Yes, patient's wifeat by phone Disposition Plan:  Anticipate discharge back to previous environment Consults called:  none Admission status:   progressive unit as inpt        Status is: Inpatient  Remains inpatient appropriate because:Inpatient level of care appropriate due to severity of illness.  Patient has multiple comorbidities,  including stage IV metastasized pancreatic cancer, now presents with bilateral PE.  His presentation is highly complicated.  Patient is at high risk of deteriorating.  Need to be treated in hospital for at least 2 days.   Dispo: The patient is from: Home              Anticipated d/c is to: Home              Anticipated d/c date is: 2 days              Patient currently is not medically stable to d/c.          Date of Service 09/02/2020    Ivor Costa [facility]   If 7PM-7AM, please contact night-coverage [email or web address] 09/02/2020, 9:22 AM

## 2020-09-02 NOTE — Progress Notes (Signed)
   09/02/20 1530  Assess: MEWS Score  Temp 98 F (36.7 C)  BP (!) 154/69  Pulse Rate 76  Resp 19  SpO2 100 %  O2 Device Room Air  Assess: MEWS Score  MEWS Temp 0  MEWS Systolic 0  MEWS Pulse 0  MEWS RR 0  MEWS LOC 0  MEWS Score 0  MEWS Score Color Green   Pt arrived to room 255. Pt A&Ox4. Call bell within reach. Bed wheels locked. Informed pt to use call bell if he needed to use the restroom. Pt stated he understood.

## 2020-09-02 NOTE — ED Notes (Signed)
Report off to anna rn  

## 2020-09-02 NOTE — Telephone Encounter (Signed)
Done

## 2020-09-02 NOTE — Telephone Encounter (Signed)
Craig [name], please switch treatment on 11/29 form Folfox to gemcitabine & docetaxel *new*.  Cancel dc pump on 12/1.

## 2020-09-02 NOTE — Telephone Encounter (Signed)
Oral Chemotherapy Pharmacist Encounter  Patient will bring his medication with him to his appt on 09/08/20.  Patient Education I spoke with patient for overview of new oral chemotherapy medication: Xeloda (capecitabine) for the treatment of progressive metastatic pancreatic cancer in conjunction with gemcitabine and docetaxel, planned duration until disease progression or unacceptable drug toxicity.   Counseled patient on administration, dosing, side effects, monitoring, drug-food interactions, safe handling, storage, and disposal. Patient will take 3 tablets (1,500 mg total) by mouth 2 (two) times daily after a meal. Take for 14 days on, 7 days off. Repeat every 21 days.  Side effects include but not limited to: diarrhea, hand/foot syndrome, decreased wbc/hgb/plt, N/V, edema, fatigue.    Reviewed with patient importance of keeping a medication schedule and plan for any missed doses.  After discussion with patient no patient barriers to medication adherence identified.   Mr. Vanatta voiced understanding and appreciation. All questions answered. Medication handout provided.  Provided patient with [facility] phone number. Patient knows to call the office with questions or concerns. Oral Chemotherapy [facility] will continue to follow.  Darl Pikes, PharmD, BCPS, BCOP, CPP Hematology/Oncology Clinical Pharmacist Practitioner [facility]/HP/AP [facility] [phone]  09/02/2020 3:45 PM

## 2020-09-02 NOTE — Progress Notes (Signed)
ON PATHWAY REGIMEN - Pancreatic Adenocarcinoma  No Change  Continue With Treatment as Ordered.  Original Decision Date/Time: 06/06/2020 08:55     A cycle is every 14 days:     Oxaliplatin      Leucovorin      Fluorouracil      Fluorouracil   **Always confirm dose/schedule in your pharmacy ordering system**  Patient Characteristics: Metastatic Disease, Third Line and Beyond, MSS/pMMR or MSI Unknown Therapeutic Status: Metastatic Disease Line of Therapy: Third Engineer, civil (consulting) Status: Unknown Intent of Therapy: Non-Curative / Palliative Intent, Discussed with Patient

## 2020-09-02 NOTE — ED Provider Notes (Signed)
[facility] Emergency Department Provider Note  ____________________________________________   First MD Initiated Contact with Patient 09/02/20 0226     (approximate)  I have reviewed the triage vital signs and the nursing notes.   HISTORY  Chief Complaint Abnormal Lab   HPI Craig [name] is a 74 y.o. 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  Patient states he had some shortness of breath last week which prompted him to seek evaluation and is physician to order the CT. Patient states that his shortness of breath has actually improved today he denies any other acute symptoms including chest pain, cough, vomiting, diarrhea, dysuria, abdominal pain, back pain, headache, earache, sore throat, cough, fevers or any rashes.  No prior similar episodes.  Patient has a remote history of tobacco abuse but has never had a PE before.         Past Medical History:  Diagnosis Date  . Allergic rhinitis   . CHF (congestive heart failure) ([place])   . Chronic cholecystitis   . Coronary artery disease   . DDD (degenerative disc disease), cervical   . Dehydration 06/21/2019  . Diabetes mellitus without complication ([place])   . Dyspnea   . Early cataract   . Erectile dysfunction   . Family history of brain cancer   . Family history of cancer   . GERD (gastroesophageal reflux disease)   . Gouty arthritis   . Headache    occasional migraines  . Hypercalcemia   . Hyperlipemia   . Hypertension   . Palpitations   . Pancreatic cancer metastasized to liver ([place]) 05/2019   Chemo tx's   . Peripheral vascular disease ([place])   . 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   LIMA to LAD SVG to [place] SVG SEQUENTIALLY to OM1 and OM2 SVG to ACUTE MARGINAL  . Spinal stenosis of cervical region   . Vitamin D deficiency     Patient Active Problem List   Diagnosis Date Noted  . Genetic testing 09/01/2020  . Flatulence 08/25/2020  . Constipation 08/25/2020  . RUQ abdominal pain 08/25/2020  . Family history of brain cancer   . Family history of cancer   . Neuropathy 06/18/2020  . Hypokalemia 03/17/2020  . 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 Malnutrition of moderate degree 01/26/2020  . DKA (diabetic ketoacidoses) 01/24/2020  . Fatigue 10/03/2019  . Low-level of literacy 06/29/2019  . Abnormal LFTs 06/29/2019  . AKI (acute kidney injury) ([place]) 06/29/2019  . Diarrhea 06/29/2019  . Orthostatic hypotension 06/29/2019  . Hyperglycemia 06/29/2019  . Anemia due to antineoplastic chemotherapy 06/29/2019  . Dehydration 06/21/2019  . Encounter for antineoplastic chemotherapy 06/21/2019  . Normocytic anemia 06/21/2019  . Port-A-Cath in place 06/07/2019  . Goals of care, counseling/discussion 06/01/2019  . Pancreatic cancer metastasized to liver ([place]) 06/01/2019  . Liver metastasis ([place]) 06/01/2019  . CAD (coronary artery disease) 05/17/2019  . Personal history of tobacco use, presenting hazards to health 05/10/2019  . Peripheral arterial disease ([place]) 05/09/2019  . Prediabetes 05/01/2019  . LVH (left ventricular hypertrophy) due to hypertensive disease, without heart failure 01/04/2018  .  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 Postoperative anemia   . Coronary artery disease involving native coronary artery of native heart   . Coronary artery disease involving native coronary artery of native heart 09/16/2017  . Chronic shortness of breath 08/11/2017  . Tobacco abuse 08/11/2017  . Spondylolisthesis, lumbar region 03/16/2017  . Hyperlipemia, mixed 09/10/2015  . Impotence of organic origin  09/10/2015  . Essential (primary) hypertension 09/10/2015  . Degeneration of cervical intervertebral disc 09/10/2015  . Allergic rhinitis 09/10/2015  . GERD (gastroesophageal reflux disease) 06/25/2015  . Cervical stenosis of spinal canal 06/25/2015  . Atherosclerosis of native arteries of extremity with intermittent claudication ([place]) 06/25/2015    Past Surgical History:  Procedure Laterality Date  . BACK SURGERY  2018    fusion with screws  . CHOLECYSTECTOMY N/A 11/13/2018   Procedure: LAPAROSCOPIC CHOLECYSTECTOMY;  Surgeon: Vickie Epley, MD;  Location: [facility];  Service: General;  Laterality: N/A;  . COLONOSCOPY    . CORONARY ARTERY BYPASS GRAFT N/A 09/30/2017   Procedure: CORONARY ARTERY BYPASS GRAFTING times five using right and left Saphaneous vein harvested endoscopicly  and left internal mammary artery. (CABG),TEE;  Surgeon: Rexene Alberts, MD;  Location: [facility];  Service: Open Heart Surgery;  Laterality: N/A;  . LEFT HEART CATH AND CORONARY ANGIOGRAPHY Left 09/06/2017   Procedure: LEFT HEART CATH AND CORONARY ANGIOGRAPHY;  Surgeon: Corey Skains, MD;  Location: [facility];  Service: Cardiovascular;  Laterality: Left;  . percutaneous transluminal balloon angioplasty  01/2010   of left lower extremity  . PORTA CATH INSERTION N/A 06/06/2019   Procedure: PORTA CATH INSERTION;  Surgeon: Katha Cabal, MD;  Location: [facility];  Service: Cardiovascular;  Laterality: N/A;  . TEE WITHOUT CARDIOVERSION N/A 09/30/2017   Procedure: TRANSESOPHAGEAL ECHOCARDIOGRAM (TEE);  Surgeon: Rexene Alberts, MD;  Location: [facility];  Service: Open Heart Surgery;  Laterality: N/A;  . VASCULAR SURGERY Left 2010   left exernal iliac and superficial femoral artery PTCA and stenting    Prior to Admission medications   Medication Sig Start Date End Date Taking? Authorizing Provider  aspirin EC 81 MG tablet Take 81 mg by mouth daily.    [provider]    capecitabine (XELODA) 500 MG tablet Take 3 tablets (1,500 mg total) by mouth 2 (two) times daily after a meal. Take for 14 days on, 7 days off. Repeat every 21 days. 09/02/20   Earlie Server, MD  carvedilol (COREG) 6.25 MG tablet Take 6.25 mg by mouth 2 (two) times daily with a meal. 03/01/18   [provider]  Continuous Blood Gluc Receiver (FREESTYLE LIBRE 14 DAY READER) DEVI 1 Device by Does not apply route every 14 (fourteen) days. 01/29/20   Trinna Post, PA-C  diltiazem (CARDIZEM CD) 300 MG 24 hr capsule TAKE 1 CAPSULE BY MOUTH EVERY DAY 07/06/20   Trinna Post, PA-C  docusate sodium (COLACE) 100 MG capsule Take 1 capsule (100 mg total) by mouth 2 (two) times daily. 08/25/20   Flinchum, Kelby Aline, FNP  dorzolamide-timolol (COSOPT) 22.3-6.8 MG/ML ophthalmic solution INSTILL ONE DROP INTO BOTH EYES TWICE DAILY 11/08/18   [provider]  DULoxetine (CYMBALTA) 30 MG capsule TAKE 2 CAPSULES BY MOUTH EVERY DAY 06/24/20   Earlie Server, MD  ferrous sulfate 325 (65 FE) MG tablet TAKE 1 TABLET BY MOUTH EVERY DAY WITH BREAKFAST 06/13/20   Carles Collet M, PA-C  gabapentin (NEURONTIN) 300 MG capsule Take 300 mg  by mouth 3 (three) times daily.    [provider]  ibuprofen (ADVIL) 800 MG tablet Take 1 tablet (800 mg total) by mouth every 8 (eight) hours as needed. 10/15/19   Earlie Server, MD  insulin glargine (LANTUS SOLOSTAR) 100 UNIT/ML Solostar Pen Inject 20 Units into the skin at bedtime. Patient taking differently: Inject 16 Units into the skin at bedtime.  01/26/20   Debbe Odea, MD  insulin lispro (HUMALOG) 100 UNIT/ML KwikPen Inject 0.05 mLs (5 Units total) into the skin 3 (three) times daily with meals. 01/26/20   Debbe Odea, MD  Insulin Pen Needle (PEN NEEDLES) 32G X 5 MM MISC Use four times daily for insulin 01/30/20   Carles Collet M, PA-C  latanoprost (XALATAN) 0.005 % ophthalmic solution Place 1 drop into both eyes at bedtime.  07/15/18   [provider]   lidocaine-prilocaine (EMLA) cream APPLY TO AFFECTED AREA ONCE AS DIRECTED 08/24/19   [provider]  Multiple Vitamin (MULTIVITAMIN WITH MINERALS) TABS tablet Take 1 tablet by mouth daily.     [provider]  OMEGA-3 FATTY ACIDS PO Take 1,200 mg by mouth daily.     [provider]  omeprazole (PRILOSEC) 40 MG capsule TAKE 1 CAPSULE BY MOUTH EVERY DAY 06/10/20   Trinna Post, PA-C  ONETOUCH VERIO test strip TEST FASTING SUGAR EVERY MORNING 02/09/20   Trinna Post, PA-C  oxyCODONE (OXY IR/ROXICODONE) 5 MG immediate release tablet Take 1 tablet (5 mg total) by mouth every 6 (six) hours as needed for severe pain. 01/15/20   Earlie Server, MD  rosuvastatin (CRESTOR) 10 MG tablet Take 1 tablet by mouth daily. 04/27/19   [provider]  triamcinolone ointment (KENALOG) 0.5 % APPLY 1 APPLICATION TOPICALLY 3 (THREE) TIMES DAILY. 04/28/20   Jacquelin Hawking, NP    Allergies Lipitor [atorvastatin], Zetia [ezetimibe], Lisinopril, Protonix [pantoprazole], and Spironolactone  Family History  Problem Relation Age of Onset  . Brain cancer Mother   . Emphysema Father   . Cancer Brother     Social History Social History   Tobacco Use  . Smoking status: Former Smoker    Packs/day: 0.75    Years: 50.00    Pack years: 37.50    Types: Cigarettes    Quit date: 09/27/2017    Years since quitting: 2.9  . Smokeless tobacco: Former Systems developer    Types: Secondary school teacher  . Vaping Use: Never used  Substance Use Topics  . Alcohol use: Not Currently    Comment: stopped drinking before cabg  . Drug use: Not Currently    Types: Marijuana    Comment: stopped smoking before CABG    Review of Systems  Review of Systems  Constitutional: Negative for chills and fever.  HENT: Negative for sore throat.   Eyes: Negative for pain.  Respiratory: Positive for shortness of breath. Negative for cough and stridor.   Cardiovascular: Negative for chest pain.  Gastrointestinal:  Negative for vomiting.  Skin: Negative for rash.  Neurological: Negative for seizures, loss of consciousness and headaches.  Psychiatric/Behavioral: Negative for suicidal ideas.  All other systems reviewed and are negative.     ____________________________________________   PHYSICAL EXAM:  VITAL SIGNS: ED Triage Vitals  Enc Vitals Group     BP      Pulse      Resp      Temp      Temp src      SpO2  Weight      Height      Head Circumference      Peak Flow      Pain Score      Pain Loc      Pain Edu?      Excl. in [place]?    Vitals:   09/02/20 0245 09/02/20 0300  BP: 117/84 (!) 158/84  Pulse: (!) 101   Resp: (!) 21 18  Temp:    SpO2: 99% 99%   Physical Exam Vitals and nursing note reviewed.  Constitutional:      Appearance: He is well-developed.  HENT:     Head: Normocephalic and atraumatic.     Right Ear: External ear normal.     Left Ear: External ear normal.     Nose: Nose normal.  Eyes:     Conjunctiva/sclera: Conjunctivae normal.  Cardiovascular:     Rate and Rhythm: Regular rhythm. Tachycardia present.     Heart sounds: No murmur heard.   Pulmonary:     Effort: Pulmonary effort is normal. Tachypnea present. No respiratory distress.     Breath sounds: Normal breath sounds.  Abdominal:     Palpations: Abdomen is soft.     Tenderness: There is no abdominal tenderness.  Musculoskeletal:     Cervical back: Neck supple.  Skin:    General: Skin is warm and dry.     Capillary Refill: Capillary refill takes less than 2 seconds.  Neurological:     Mental Status: He is alert and oriented to person, place, and time.  Psychiatric:        Mood and Affect: Mood normal.      ____________________________________________   LABS (all labs ordered are listed, but only abnormal results are displayed)  Labs Reviewed  CBC WITH DIFFERENTIAL/PLATELET - Abnormal; Notable for the following components:      Result Value   RBC 3.31 (*)    Hemoglobin 9.6 (*)      HCT 31.1 (*)    RDW 18.2 (*)    Monocytes Absolute 1.3 (*)    All other components within normal limits  COMPREHENSIVE METABOLIC PANEL - Abnormal; Notable for the following components:   Glucose, Bld 127 (*)    Albumin 3.0 (*)    Alkaline Phosphatase 174 (*)    All other components within normal limits  TROPONIN I (HIGH SENSITIVITY) - Abnormal; Notable for the following components:   Troponin I (High Sensitivity) 18 (*)    All other components within normal limits  RESP PANEL BY RT-PCR (FLU A&B, COVID) ARPGX2  MAGNESIUM  BRAIN NATRIURETIC PEPTIDE   ____________________________________________  EKG  Sinus rhythm with a ventricular rate of 98, normal axis, unremarkable intervals, no clear evidence of acute ischemia. ____________________________________________  RADIOLOGY  ED MD interpretation: No overt edema, large effusion, focal consolidation, pneumothorax or other acute intra thoracic process.  Official radiology report(s): DG Chest 2 View  Result Date: 09/02/2020 CLINICAL DATA:  Tachycardia EXAM: CHEST - 2 VIEW COMPARISON:  None. FINDINGS: The heart size and mediastinal contours are within normal limits. Both lungs are clear. The visualized skeletal structures are unremarkable. Right chest wall Port-A-Cath with tip at the cavoatrial junction. IMPRESSION: No active cardiopulmonary disease. Electronically Signed   By: Ulyses Jarred M.D.   On: 09/02/2020 03:28    ____________________________________________   PROCEDURES  Procedure(s) performed (including Critical Care):  .Critical Care Performed by: Lucrezia Starch, MD Authorized by: Lucrezia Starch, MD   Critical care provider statement:  Critical care time (minutes):  45   Critical care time was exclusive of:  Separately billable procedures and treating other patients   Critical care was necessary to treat or prevent imminent or life-threatening deterioration of the following conditions:  Circulatory failure    Critical care was time spent personally by me on the following activities:  Discussions with consultants, evaluation of patient's response to treatment, examination of patient, ordering and performing treatments and interventions, ordering and review of laboratory studies, ordering and review of radiographic studies, pulse oximetry, re-evaluation of patient's condition, obtaining history from patient or surrogate and review of old charts     ____________________________________________   INITIAL IMPRESSION / [place] / ED COURSE        Patient presents with [place] to history exam after being referred to the ED for further assessment work-up after outpatient CT was obtained for assessment of shortness of breath that revealed bilateral PEs.  The CT was reviewed by me was remarkable for the following: 1. Bilateral pulmonary emboli. No evidence of right heart strain. 2. Progressive hepatic metastatic disease. 3. Slight interval decrease in size of the pancreatic head mass. 4. Stable advanced atherosclerotic calcifications involving the thoracic and abdominal aorta and branch vessels including the coronary arteries. 5. Stable pulmonary nodules. 6. Aortic atherosclerosis.  While patient is asymptomatic on arrival he is slightly tachycardic and he is tachypneic.  He has no evidence of hypoxic respiratory failure at this time.  He denies any other acute symptoms and I have a low suspicion for pneumonia or ACS at this time given absence of fever, cough or any history of chest pain.  In addition patient has a reassuring EKG and troponin is at the upper limit of normal. CBC shows evidence of anemia with hemoglobin 9.8 but this appears to be baseline as patient's hemoglobin is noted to be 9.70-month ago.  No significant derangements on CBC.  CMP shows no significant ocular metabolic derangements.  Magnesium is unremarkable.  Given tachycardia on arrival with bilateral PEs patient started on  heparin and admitted to medicine service. ____________________________________________   FINAL CLINICAL IMPRESSION(S) / ED DIAGNOSES  Final diagnoses:  Multiple subsegmental pulmonary emboli without acute cor pulmonale (HCC)    Medications  heparin bolus via infusion 5,000 Units (has no administration in time range)  heparin ADULT infusion 100 units/mL (25000 units/238mL sodium chloride 0.45%) (has no administration in time range)     ED Discharge Orders    None       Note:  This document was prepared using Dragon voice recognition software and may include unintentional dictation errors.   Lucrezia Starch, MD 09/02/20 [phone]

## 2020-09-02 NOTE — Progress Notes (Addendum)
[facility] for Heparin Indication: pulmonary embolus and DVT  Allergies  Allergen Reactions  . Lipitor [Atorvastatin] Other (See Comments)    MYALGIA  . Zetia [Ezetimibe] Other (See Comments)    MYALGIA  . Lisinopril Cough  . Protonix [Pantoprazole] Other (See Comments)    headache  . Spironolactone Other (See Comments)    Breast tenderness    Patient Measurements: Height: 5\' 9"  (175.3 cm) Weight: 68.5 kg (151 lb 1.6 oz) IBW/kg (Calculated) : 70.7 HEPARIN DW (KG): 68.5  Vital Signs: Temp: 97.5 F (36.4 C) (11/23 1310) Temp Source: Oral (11/23 1310) BP: 173/82 (11/23 1310) Pulse Rate: 85 (11/23 1310)  Labs: Recent Labs    09/02/20 0237 09/02/20 0418 09/02/20 1330  HGB 9.6*  --   --   HCT 31.1*  --   --   PLT 200  --   --   APTT 35  --   --   LABPROT 13.7  --   --   INR 1.1  --   --   HEPARINUNFRC  --   --  0.66  CREATININE 0.91  --   --   TROPONINIHS 18* 15  --     Estimated Creatinine Clearance: 70 mL/min (by C-G formula based on SCr of 0.91 mg/dL).   Medical History: Past Medical History:  Diagnosis Date  . Allergic rhinitis   . CHF (congestive heart failure) ([place])   . Chronic cholecystitis   . Coronary artery disease   . DDD (degenerative disc disease), cervical   . Dehydration 06/21/2019  . Diabetes mellitus without complication ([place])   . Dyspnea   . Early cataract   . Erectile dysfunction   . Family history of brain cancer   . Family history of cancer   . GERD (gastroesophageal reflux disease)   . Gouty arthritis   . Headache    occasional migraines  . Hypercalcemia   . Hyperlipemia   . Hypertension   . Palpitations   . Pancreatic cancer metastasized to liver ([place]) 05/2019   Chemo tx's  . Peripheral vascular disease ([place])   . 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   LIMA to LAD SVG to [place] SVG SEQUENTIALLY to OM1 and OM2 SVG to ACUTE MARGINAL  . Spinal stenosis of cervical region   . Vitamin D  deficiency     Medications:  Medications Prior to Admission  Medication Sig Dispense Refill Last Dose  . aspirin EC 81 MG tablet Take 81 mg by mouth daily.   09/01/2020 at Unknown time  . carvedilol (COREG) 6.25 MG tablet Take 6.25 mg by mouth 2 (two) times daily with a meal.  3 09/01/2020 at Unknown time  . diltiazem (CARDIZEM CD) 300 MG 24 hr capsule TAKE 1 CAPSULE BY MOUTH EVERY DAY 90 capsule 0 09/01/2020 at Unknown time  . docusate sodium (COLACE) 100 MG capsule Take 1 capsule (100 mg total) by mouth 2 (two) times daily. 60 capsule 0 09/01/2020 at Unknown time  . dorzolamide-timolol (COSOPT) 22.3-6.8 MG/ML ophthalmic solution INSTILL ONE DROP INTO BOTH EYES TWICE DAILY   09/01/2020 at Unknown time  . DULoxetine (CYMBALTA) 30 MG capsule TAKE 2 CAPSULES BY MOUTH EVERY DAY 180 capsule 1 09/01/2020 at Unknown time  . ferrous sulfate 325 (65 FE) MG tablet TAKE 1 TABLET BY MOUTH EVERY DAY WITH BREAKFAST 90 tablet 1 09/01/2020 at Unknown time  . gabapentin (NEURONTIN) 300 MG capsule Take 300 mg by mouth 3 (three) times daily.  09/01/2020 at Unknown time  . insulin glargine (LANTUS SOLOSTAR) 100 UNIT/ML Solostar Pen Inject 20 Units into the skin at bedtime. 15 mL 0 09/01/2020 at Unknown time  . latanoprost (XALATAN) 0.005 % ophthalmic solution Place 1 drop into both eyes at bedtime.   3 09/01/2020 at Unknown time  . Multiple Vitamin (MULTIVITAMIN WITH MINERALS) TABS tablet Take 1 tablet by mouth daily.    09/01/2020 at Unknown time  . OMEGA-3 FATTY ACIDS PO Take 1,200 mg by mouth daily.    09/01/2020 at Unknown time  . omeprazole (PRILOSEC) 40 MG capsule TAKE 1 CAPSULE BY MOUTH EVERY DAY 90 capsule 1 09/01/2020 at Unknown time  . ONETOUCH VERIO test strip TEST FASTING SUGAR EVERY MORNING 100 strip 3 09/01/2020 at Unknown time  . rosuvastatin (CRESTOR) 10 MG tablet Take 1 tablet by mouth daily.   09/01/2020 at Unknown time  . triamcinolone ointment (KENALOG) 0.5 % APPLY 1 APPLICATION TOPICALLY 3  (THREE) TIMES DAILY. 30 g 0 09/01/2020 at Unknown time  . capecitabine (XELODA) 500 MG tablet Take 3 tablets (1,500 mg total) by mouth 2 (two) times daily after a meal. Take for 14 days on, 7 days off. Repeat every 21 days. 84 tablet 0   . insulin lispro (HUMALOG) 100 UNIT/ML KwikPen Inject 0.05 mLs (5 Units total) into the skin 3 (three) times daily with meals. (Patient not taking: Reported on 09/02/2020) 15 mL 11 Not Taking at Unknown time  . Insulin Pen Needle (PEN NEEDLES) 32G X 5 MM MISC Use four times daily for insulin (Patient not taking: Reported on 09/02/2020) 400 each 1 Not Taking at Unknown time  . lidocaine-prilocaine (EMLA) cream APPLY TO AFFECTED AREA ONCE AS DIRECTED   prn at prn    Assessment: Heparin for PE.  Baseline labs ordered.  No anticoagulants PTA per med list.  11/23 1330 HL 0.66  Goal of Therapy:  Heparin level 0.3-0.7 units/ml Monitor platelets by anticoagulation protocol: Yes   Plan:  Heparin level is therapeutic. Will continue heparin at 1200 units/hr. Recheck heparin level 8 hours. CBC daily while on heparin.   Oswald Hillock, PharmD, BCPS 09/02/2020,2:19 PM

## 2020-09-02 NOTE — Telephone Encounter (Signed)
-----   Message from Darl Pikes, [place] sent at 09/02/2020  8:50 AM EST ----- Hey! The Xeloda for [name] is ready and he has a $0 copay.   Dr. Tasia Catchings is the plan to keep his infusion on 11/29 and just start the new treatment then?  -Alyson  ----- Message ----- From: Earlie Server, MD Sent: 09/02/2020   1:57 AM EST To: Evelina Dun, RN, Vanice Sarah, CMA, #  His CT showed disease progression as well as PE. I talked to him tonight and ask him to go to ER.   Assuming he will get anticoagulation and go home. Plan to switch his chemotherapy plan from folfox to Gemcitabine and Docetaxel + Xeloda.  I will change his chemo regimen.   Alyson, please check her coverage. Thanks.

## 2020-09-02 NOTE — Progress Notes (Signed)
ANTICOAGULATION CONSULT NOTE - Initial Consult  Pharmacy Consult for Heparin Indication: pulmonary embolus  Allergies  Allergen Reactions  . Lipitor [Atorvastatin] Other (See Comments)    MYALGIA  . Zetia [Ezetimibe] Other (See Comments)    MYALGIA  . Lisinopril Cough  . Protonix [Pantoprazole] Other (See Comments)    headache  . Spironolactone Other (See Comments)    Breast tenderness    Patient Measurements: Height: 5\' 9"  (175.3 cm) Weight: 72.6 kg (160 lb) IBW/kg (Calculated) : 70.7 HEPARIN DW (KG): 72.6  Vital Signs: Temp: 98.1 F (36.7 C) (11/23 0228) Temp Source: Oral (11/23 0228) BP: 158/84 (11/23 0300) Pulse Rate: 101 (11/23 0245)  Labs: Recent Labs    09/02/20 0237  HGB 9.6*  HCT 31.1*  PLT 200  CREATININE 0.91  TROPONINIHS 18*    Estimated Creatinine Clearance: 72.3 mL/min (by C-G formula based on SCr of 0.91 mg/dL).   Medical History: Past Medical History:  Diagnosis Date  . Allergic rhinitis   . CHF (congestive heart failure) ([place])   . Chronic cholecystitis   . Coronary artery disease   . DDD (degenerative disc disease), cervical   . Dehydration 06/21/2019  . Diabetes mellitus without complication ([place])   . Dyspnea   . Early cataract   . Erectile dysfunction   . Family history of brain cancer   . Family history of cancer   . GERD (gastroesophageal reflux disease)   . Gouty arthritis   . Headache    occasional migraines  . Hypercalcemia   . Hyperlipemia   . Hypertension   . Palpitations   . Pancreatic cancer metastasized to liver ([place]) 05/2019   Chemo tx's  . Peripheral vascular disease ([place])   . 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   LIMA to LAD SVG to [place] SVG SEQUENTIALLY to OM1 and OM2 SVG to ACUTE MARGINAL  . Spinal stenosis of cervical region   . Vitamin D deficiency     Medications:  (Not in a hospital admission)   Assessment: Heparin for PE.  Baseline labs ordered.  No anticoagulants PTA per med list.  Goal of  Therapy:  Heparin level 0.3-0.7 units/ml Monitor platelets by anticoagulation protocol: Yes   Plan:  Heparin bolus 5000 units x 1 then infusion at 1200 units/hr Check HL ~ 8 hours after heparin initiated.    [place] Crane, Anne-Marie Genson A 09/02/2020,3:59 AM

## 2020-09-02 NOTE — Telephone Encounter (Signed)
I just got his CT scan results that was read over the weekend. Patient has developed bilateral pulmonary embolism.  I called patient and communicated with him about the results.  He reports feeling shortness of breath last week but not currently.  I encourage patient to go to emergency room to get started on anticoagulation.  He agrees with the plan. I also communicated with him that liver metastasis has progressed.  Patient has been through several lines of chemotherapy.  If he continues to have acceptable performance status and desires to try other chemotherapy, we can further discuss about that.   his bilateral pulmonary embolism need to be addressed urgently.  He agrees to plan we will go to emergency room.

## 2020-09-02 NOTE — Progress Notes (Signed)
*  PRELIMINARY RESULTS* Echocardiogram 2D Echocardiogram has been performed.  Craig [name] 09/02/2020, 12:27 PM

## 2020-09-02 NOTE — Progress Notes (Signed)
ANTICOAGULATION CONSULT NOTE   Pharmacy Consult for Heparin Indication: pulmonary embolus and DVT  Patient Measurements: Height: 5\' 9"  (175.3 cm) Weight: 68.5 kg (151 lb 1.6 oz) IBW/kg (Calculated) : 70.7 HEPARIN DW (KG): 68.5  Labs: Recent Labs    09/02/20 0237 09/02/20 0418 09/02/20 1330 09/02/20 2233  HGB 9.6*  --   --   --   HCT 31.1*  --   --   --   PLT 200  --   --   --   APTT 35  --   --   --   LABPROT 13.7  --   --   --   INR 1.1  --   --   --   HEPARINUNFRC  --   --  0.66 0.59  CREATININE 0.91  --   --   --   TROPONINIHS 18* 15  --   --     Estimated Creatinine Clearance: 70 mL/min (by C-G formula based on SCr of 0.91 mg/dL).   Medical History: Past Medical History:  Diagnosis Date  . Allergic rhinitis   . CHF (congestive heart failure) ([place])   . Chronic cholecystitis   . Coronary artery disease   . DDD (degenerative disc disease), cervical   . Dehydration 06/21/2019  . Diabetes mellitus without complication ([place])   . Dyspnea   . Early cataract   . Erectile dysfunction   . Family history of brain cancer   . Family history of cancer   . GERD (gastroesophageal reflux disease)   . Gouty arthritis   . Headache    occasional migraines  . Hypercalcemia   . Hyperlipemia   . Hypertension   . Palpitations   . Pancreatic cancer metastasized to liver ([place]) 05/2019   Chemo tx's  . Peripheral vascular disease ([place])   . 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 Spinal stenosis of cervical region   . Vitamin D deficiency     Assessment: Heparin for PE. Baseline aPTT 35s, INR 1.1.  No anticoagulants PTA per med list.  11/23 1330 HL 0.66 11/23 2233 HL 0.59  Goal of Therapy:  Heparin level 0.3-0.7 units/ml Monitor platelets by anticoagulation protocol: Yes   Plan:  Heparin level is therapeutic x 2. Will continue heparin at 1200 units/hr. Recheck heparin level tomorrow AM. CBC  daily while on heparin.   Craig [name] 09/02/2020,11:52 PM

## 2020-09-02 NOTE — ED Notes (Signed)
Pt sent to er for eval of blood clots in lungs.  Pt denies sob.  No chest pain  Hx pancreatic cancer.  Last chemo was 2 weeks ago.  Pt reports sob last week.  md at bedside.  nsr on monitor.  Iv started, labs sent   Family with pt.

## 2020-09-02 NOTE — Progress Notes (Signed)
Patient transferred to ED 37.

## 2020-09-02 NOTE — Telephone Encounter (Signed)
Oral Oncology Pharmacist Encounter  Received new prescription for Xeloda (capecitabine) for the treatment of progressive metastatic pancreatic cancer in conjunction with gemcitabine and docetaxel, planned duration until disease progression or unacceptable drug toxicity.  CMP from 09/02/20 assessed, no relevant lab abnormalities. Prescription dose and frequency assessed.   Current medication list in Epic reviewed, no relevant DDIs with capecitabine identified.  Evaluated chart and no patient barriers to medication adherence identified.   Prescription has been e-scribed to the [facility] for benefits analysis and approval.  Oral Oncology Clinic will continue to follow for insurance authorization, copayment issues, initial counseling and start date.  Darl Pikes, PharmD, BCPS, BCOP, CPP Hematology/Oncology Clinical Pharmacist Practitioner [facility] [phone]  09/02/2020 8:29 AM\

## 2020-09-03 DIAGNOSIS — I2699 Other pulmonary embolism without acute cor pulmonale: Secondary | ICD-10-CM | POA: Diagnosis not present

## 2020-09-03 LAB — HEPARIN LEVEL (UNFRACTIONATED): Heparin Unfractionated: 0.53 IU/mL (ref 0.30–0.70)

## 2020-09-03 LAB — GLUCOSE, CAPILLARY
Glucose-Capillary: 91 mg/dL (ref 70–99)
Glucose-Capillary: 131 mg/dL — ABNORMAL HIGH (ref 70–99)
Glucose-Capillary: 108 mg/dL — ABNORMAL HIGH (ref 70–99)
Glucose-Capillary: 114 mg/dL — ABNORMAL HIGH (ref 70–99)

## 2020-09-03 LAB — CBC
HCT: 29.1 % — ABNORMAL LOW (ref 39.0–52.0)
Hemoglobin: 9 g/dL — ABNORMAL LOW (ref 13.0–17.0)
MCH: 28.9 pg (ref 26.0–34.0)
MCHC: 30.9 g/dL (ref 30.0–36.0)
MCV: 93.6 fL (ref 80.0–100.0)
Platelets: 221 10*3/uL (ref 150–400)
RBC: 3.11 MIL/uL — ABNORMAL LOW (ref 4.22–5.81)
RDW: 18 % — ABNORMAL HIGH (ref 11.5–15.5)
WBC: 6.1 10*3/uL (ref 4.0–10.5)
nRBC: 0 % (ref 0.0–0.2)

## 2020-09-03 LAB — ECHOCARDIOGRAM COMPLETE
AR max vel: 1.87 cm2
AV Area VTI: 1.71 cm2
AV Area mean vel: 1.89 cm2
AV Mean grad: 3 mmHg
AV Peak grad: 5.3 mmHg
Ao pk vel: 1.15 m/s
Area-P 1/2: 5.2 cm2
Height: 69 in
S' Lateral: 2.85 cm
Weight: 2560 oz

## 2020-09-03 MED ORDER — APIXABAN 5 MG PO TABS
10.0000 mg | ORAL_TABLET | Freq: Two times a day (BID) | ORAL | Status: DC
  Administered 2020-09-04: 10 mg via ORAL
  Filled 2020-09-03: qty 2

## 2020-09-03 MED ORDER — LIDOCAINE-PRILOCAINE 2.5-2.5 % EX CREA
TOPICAL_CREAM | CUTANEOUS | Status: DC | PRN
  Filled 2020-09-03: qty 5

## 2020-09-03 MED ORDER — ENSURE ENLIVE PO LIQD
237.0000 mL | Freq: Three times a day (TID) | ORAL | Status: DC
  Administered 2020-09-04: 237 mL via ORAL

## 2020-09-03 MED ORDER — APIXABAN 5 MG PO TABS: 5.0000 mg | ORAL_TABLET | Freq: Two times a day (BID) | ORAL | Status: DC

## 2020-09-03 NOTE — Progress Notes (Signed)
ANTICOAGULATION CONSULT NOTE   Pharmacy Consult for Heparin Indication: pulmonary embolus and DVT  Patient Measurements: Height: 5\' 9"  (175.3 cm) Weight: 68.3 kg (150 lb 9.6 oz) IBW/kg (Calculated) : 70.7 HEPARIN DW (KG): 68.5  Labs: Recent Labs    09/02/20 0237 09/02/20 0418 09/02/20 1330 09/02/20 2233 09/03/20 0432  HGB 9.6*  --   --   --  9.0*  HCT 31.1*  --   --   --  29.1*  PLT 200  --   --   --  221  APTT 35  --   --   --   --   LABPROT 13.7  --   --   --   --   INR 1.1  --   --   --   --   HEPARINUNFRC  --   --  0.66 0.59 0.53  CREATININE 0.91  --   --   --   --   TROPONINIHS 18* 15  --   --   --     Estimated Creatinine Clearance: 69.8 mL/min (by C-G formula based on SCr of 0.91 mg/dL).   Medical History: Past Medical History:  Diagnosis Date  . Allergic rhinitis   . CHF (congestive heart failure) ([place])   . Chronic cholecystitis   . Coronary artery disease   . DDD (degenerative disc disease), cervical   . Dehydration 06/21/2019  . Diabetes mellitus without complication ([place])   . Dyspnea   . Early cataract   . Erectile dysfunction   . Family history of brain cancer   . Family history of cancer   . GERD (gastroesophageal reflux disease)   . Gouty arthritis   . Headache    occasional migraines  . Hypercalcemia   . Hyperlipemia   . Hypertension   . Palpitations   . Pancreatic cancer metastasized to liver ([place]) 05/2019   Chemo tx's  . Peripheral vascular disease ([place])   . 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 Spinal stenosis of cervical region   . Vitamin D deficiency     Assessment: Heparin for PE. Baseline aPTT 35s, INR 1.1.  No anticoagulants PTA per med list.  11/23 1330 HL 0.66 11/23 2233 HL 0.59 11/24 0432 HL 0.53, therapeutic x 3  Goal of Therapy:  Heparin level 0.3-0.7 units/ml Monitor platelets by anticoagulation protocol: Yes   Plan:  Heparin level  is therapeutic x 3. Will continue heparin at 1200 units/hr. Recheck heparin level tomorrow AM. CBC daily while on heparin.   [place] Crane, Ben Sanz A 09/03/2020,5:38 AM

## 2020-09-03 NOTE — Progress Notes (Addendum)
VAST consulted to access patients chest port for vascular access since his PIV has Heparin infusing.  Arrived at pt's bedside and he requested EMLA be placed and allowed to numb the skin prior to accessing chest port. EMLA ordered.  SecureChat sent to pt's nurse regarding need for port to be accessed at all: Currently, only IV meds ordered are PRN and haven't been needed this hospitalization. Pt does not have any labs ordered until 11/25 am. Also noted pharmacy note that plan is to stop heparin in am and switch patient to apixaban.  Also educated unit RN regarding minimizing risk for infection by not sticking patient unless it is absolutely needed.  Pt's nurse responded to hold off on accessing port at this time.

## 2020-09-03 NOTE — Progress Notes (Signed)
ANTICOAGULATION CONSULT NOTE   Pharmacy Consult for Heparin/apixaban Indication: pulmonary embolus and DVT  Patient Measurements: Height: 5\' 9"  (175.3 cm) Weight: 68.3 kg (150 lb 9.6 oz) IBW/kg (Calculated) : 70.7 HEPARIN DW (KG): 68.5  Labs: Recent Labs    09/02/20 0237 09/02/20 0418 09/02/20 1330 09/02/20 2233 09/03/20 0432  HGB 9.6*  --   --   --  9.0*  HCT 31.1*  --   --   --  29.1*  PLT 200  --   --   --  221  APTT 35  --   --   --   --   LABPROT 13.7  --   --   --   --   INR 1.1  --   --   --   --   HEPARINUNFRC  --   --  0.66 0.59 0.53  CREATININE 0.91  --   --   --   --   TROPONINIHS 18* 15  --   --   --     Estimated Creatinine Clearance: 69.8 mL/min (by C-G formula based on SCr of 0.91 mg/dL).   Medical History: Past Medical History:  Diagnosis Date  . Allergic rhinitis   . CHF (congestive heart failure) ([place])   . Chronic cholecystitis   . Coronary artery disease   . DDD (degenerative disc disease), cervical   . Dehydration 06/21/2019  . Diabetes mellitus without complication ([place])   . Dyspnea   . Early cataract   . Erectile dysfunction   . Family history of brain cancer   . Family history of cancer   . GERD (gastroesophageal reflux disease)   . Gouty arthritis   . Headache    occasional migraines  . Hypercalcemia   . Hyperlipemia   . Hypertension   . Palpitations   . Pancreatic cancer metastasized to liver ([place]) 05/2019   Chemo tx's  . Peripheral vascular disease ([place])   . 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 Spinal stenosis of cervical region   . Vitamin D deficiency     Assessment: Heparin for PE. Baseline aPTT 35s, INR 1.1.  No anticoagulants PTA per med list.  11/23 1330 HL 0.66 11/23 2233 HL 0.59 11/24 0432 HL 0.53, therapeutic x 3  Goal of Therapy:  Heparin level 0.3-0.7 units/ml Monitor platelets by anticoagulation protocol: Yes   Plan:   Heparin level is therapeutic. Will continue heparin at 1200 units/hr. Recheck heparin level tomorrow AM. CBC daily while on heparin.   Plan to switch to apixaban tomorrow morning per discussion in progression rounds. Order placed. Ensure heparin infusion is stopped prior to giving apixaban.  Haila Dena S Cherylyn Sundby 09/03/2020,11:56 AM

## 2020-09-03 NOTE — Progress Notes (Addendum)
Initial Nutrition Assessment  DOCUMENTATION CODES:   Severe malnutrition in context of chronic illness  INTERVENTION:   Ensure Enlive po TID, each supplement provides 350 kcal and 20 grams of protein  MVI daily   Liberalize diet   Pt at moderate refeed risk; recommend monitor potassium, magnesium and phosphorus labs daily until stable  NUTRITION DIAGNOSIS:   Severe Malnutrition related to cancer and cancer related treatments as evidenced by severe fat depletion, severe muscle depletion and wt loss 17% in 8 months.  GOAL:   Patient will meet greater than or equal to 90% of their needs  MONITOR:   PO intake, Supplement acceptance, Labs, Weight trends, Skin, I & O's  REASON FOR ASSESSMENT:   Malnutrition Screening Tool    ASSESSMENT:   74 y.o. 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   Met with pt in room today. Pt reports intermittent poor appetite and oral intake pta r/t chemotherapy. Pt reports that his appetite is poor in hospital; pt documented to be eating only sips and bites of meals. Pt reports that he does drink 3-4 Ensure per day at home (prefers vanilla). Per chart, pt is down 30lbs(17%) over the past 8 months; this is significant weight loss. Pt reports his UBW is ~180lbs but that he used to weigh 210lbs. RD will add supplements to help pt meet his estimated needs. RD will also liberalize pt's diet as pt is not eating enough to exceed any nutrient limits. Pt is likely at moderate refeed risk.   Medications reviewed and include: aspirin, colace, ferrous sulfate, insulin, MVI,  lovaza, protonix  Labs reviewed: K 3.5 wnl, Mg 1.8 wnl Hgb 9.0(L), Hct 29.1(L)  NUTRITION - FOCUSED PHYSICAL EXAM:    Most Recent Value  Orbital Region Moderate depletion  Upper Arm Region Severe depletion  Thoracic and Lumbar Region Moderate depletion  Buccal Region Moderate depletion  Temple Region Severe depletion  Clavicle Bone Region Mild depletion  Clavicle and Acromion Bone Region Mild depletion  Scapular Bone Region Mild depletion  Dorsal Hand Moderate depletion  Patellar Region Severe depletion  Anterior Thigh Region Severe depletion  Posterior Calf Region Severe depletion  Edema (RD Assessment) None  Hair Reviewed  Eyes Reviewed  Mouth Reviewed  Skin Reviewed  Nails Reviewed     Diet Order:   Diet Order            Diet Carb Modified Fluid consistency: Thin; Room service appropriate? Yes  Diet effective now                EDUCATION NEEDS:   Education needs have been addressed  Skin:  Skin Assessment: Reviewed RN Assessment  Last BM:  11/22  Height:   Ht Readings from Last 1 Encounters:  09/02/20 5' 9" (1.753 m)    Weight:   Wt Readings from Last 1 Encounters:  09/03/20 68.3 kg    Ideal Body Weight:  72.7 kg  BMI:  Body mass index is 22.24 kg/m.  Estimated Nutritional Needs:   Kcal:  2000-2300kcal/day  Protein:  100-115g/day  Fluid:  1.7-2.0L/day  Koleen Distance MS, RD,  LDN Please refer to [facility] for RD and/or RD on-call/weekend/after hours pager

## 2020-09-03 NOTE — Progress Notes (Signed)
PROGRESS NOTE   Craig [name]  IRJ:[id number] DOB: 02/16/1946 DOA: 09/02/2020 PCP: Trinna Post, PA-C  Brief Narrative:  74 year old African-American male metastatic pancreatic CA 5-FU liposomal irinotecan  CABG X5 09/30/2017 EF 35-40% neuropathy HTN lumbago status post L4-5 decompression DM TY 2 with DKA diagnosed 01/26/2020-OP CT scan chest 11/19 by oncologist for SOB-found to have bilateral pulmonary emboli no right heart strain -Patient instructed by oncologist to present to ED-presented 7/23 Labs relatively stable other than BNP 161  Assessment & Plan:   Principal Problem:   Pulmonary embolism ([place]) Active Problems:   GERD (gastroesophageal reflux disease)   HLD (hyperlipidemia)   Essential (primary) hypertension   S/P CABG x 5   CAD (coronary artery disease)   Pancreatic cancer metastasized to liver ([place])   Anemia due to antineoplastic chemotherapy   Diabetes mellitus without complication (H[facility])   Depression   Chronic systolic CHF (congestive heart failure) ([place])   1. Pulmonary emboli bilateral a. Diagnosed prior to admission 11/19 and is relatively hemodynamically stable b. Follow echocardiogram ordered for this admission c. Planning for at least 2 days of IV heparin then likely transition to Eliquis-discussed with care team/pharmacist 2. Metastatic pancreatic CVA planning for alternate therapy with Dr. Tasia Catchings a. Will need outpatient discussions with her oncologist b. Xeloda from prior to admission has been held 3. Minimally elevated troponin a. In the setting of PE would not work-up any further 4. 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 Blood sugars ranging 91-1 27, eating about 60% of meals without any issues- b. Continue Lantus 10 units and sliding scale supplementation with at bedtime covered sensitive coverage c. Continue Neurontin 300 3 times daily 5. CABG 03/15/2017 with chronic systolic HFrEF 30-16 a. Continue Coreg 6.25 twice daily, Cardizem 300  daily 6. HLD a. Continue Crestor 10, omega-3 7. HTN a. As above 8. Neuropathy in the setting of L4-5 surgery in the past a. Gabapentin as above 9. Depression a. Continue Cymbalta 60 daily 10. Reflux  DVT prophylaxis: On IV heparin currently Code Status: DNR Family Communication: None present at the bedside Disposition:  Status is: Inpatient  Remains inpatient appropriate because:Ongoing diagnostic testing needed not appropriate for outpatient work up and IV treatments appropriate due to intensity of illness or inability to take PO   Dispo: The patient is from: Home              Anticipated d/c is to: Home              Anticipated d/c date is: 2 days              Patient currently is not medically stable to d/c.       Consultants:   None currently  Procedures: None Echo and duplex lower extremities are pending  Antimicrobials: No   Subjective:  No chest pain feels better less winded Has not really been out of bed much Eating and drinking okay No bleeding  Objective: Vitals:   09/02/20 1530 09/02/20 2019 09/03/20 0444 09/03/20 0744  BP: (!) 154/69 (!) 143/68 (!) 144/66 (!) 147/67  Pulse: 76 81 77 74  Resp: 19     Temp: 98 F (36.7 C) 98.4 F (36.9 C) 98.7 F (37.1 C) 98.4 F (36.9 C)  TempSrc:  Oral Oral Oral  SpO2: 100% 100% 100% 100%  Weight:   68.3 kg   Height:        Intake/Output Summary (Last 24 hours) at 09/03/2020 1039 Last data filed at 09/03/2020  0737 Gross per 24 hour  Intake 735.53 ml  Output 400 ml  Net 335.53 ml   Filed Weights   09/02/20 0226 09/02/20 1300 09/03/20 0444  Weight: 72.6 kg 68.5 kg 68.3 kg    Examination:   Awake alert coherent no distress CVAT no focal deficit S1-S2 no murmur no rub no gallop Abdomen soft no rebound no guarding No lower extremity edema   Data Reviewed: I have personally reviewed following labs and imaging studies Hemoglobin 9 White count 6 Platelet 221  Radiology Studies: DG Chest 2  View  Result Date: 09/02/2020 CLINICAL DATA:  Tachycardia EXAM: CHEST - 2 VIEW COMPARISON:  None. FINDINGS: The heart size and mediastinal contours are within normal limits. Both lungs are clear. The visualized skeletal structures are unremarkable. Right chest wall Port-A-Cath with tip at the cavoatrial junction. IMPRESSION: No active cardiopulmonary disease. Electronically Signed   By: Ulyses Jarred M.D.   On: 09/02/2020 03:28   US Venous Img Lower Bilateral (DVT)  Result Date: 09/02/2020 CLINICAL DATA:  74 year old male with a history prior pulmonary embolism EXAM: BILATERAL LOWER EXTREMITY VENOUS DOPPLER ULTRASOUND TECHNIQUE: Gray-scale sonography with graded compression, as well as color Doppler and duplex ultrasound were performed to evaluate the lower extremity deep venous systems from the level of the common femoral vein and including the common femoral, femoral, profunda femoral, popliteal and calf veins including the posterior tibial, peroneal and gastrocnemius veins when visible. The superficial great saphenous vein was also interrogated. Spectral Doppler was utilized to evaluate flow at rest and with distal augmentation maneuvers in the common femoral, femoral and popliteal veins. COMPARISON:  None. FINDINGS: RIGHT LOWER EXTREMITY Common Femoral Vein: No evidence of thrombus. Normal compressibility, respiratory phasicity and response to augmentation. Saphenofemoral Junction: No evidence of thrombus. Normal compressibility and flow on color Doppler imaging. Profunda Femoral Vein: No evidence of thrombus. Normal compressibility and flow on color Doppler imaging. Femoral Vein: No evidence of thrombus. Normal compressibility, respiratory phasicity and response to augmentation. Popliteal Vein: Partially compressible popliteal vein without occlusion. Calf Veins: No evidence of thrombus. Normal compressibility and flow on color Doppler imaging. Superficial Great Saphenous Vein: No evidence of thrombus.  Normal compressibility and flow on color Doppler imaging. Other Findings:  None. LEFT LOWER EXTREMITY Common Femoral Vein: No evidence of thrombus. Normal compressibility, respiratory phasicity and response to augmentation. Saphenofemoral Junction: No evidence of thrombus. Normal compressibility and flow on color Doppler imaging. Profunda Femoral Vein: No evidence of thrombus. Normal compressibility and flow on color Doppler imaging. Femoral Vein: No evidence of thrombus. Normal compressibility, respiratory phasicity and response to augmentation. Popliteal Vein: No evidence of thrombus. Normal compressibility, respiratory phasicity and response to augmentation. Calf Veins: No evidence of thrombus. Normal compressibility and flow on color Doppler imaging. Superficial Great Saphenous Vein: No evidence of thrombus. Normal compressibility and flow on color Doppler imaging. Other Findings: Lentiform heterogeneously echoic soft tissue within the thigh measures 4.1 cm x 1.6 cm x 5.1 cm. No internal color flow. IMPRESSION: Sonographic survey of the right lower extremity positive for nonocclusive DVT of the popliteal vein. Sonographic survey of the left lower extremity negative for DVT. Lentiform soft tissue lesion in the left thigh measures 5.1 cm. Ultrasound characteristics are nonspecific, and this may represent a lipoma. Note that low-grade liposarcoma cannot be excluded on ultrasound. Correlation with patient presentation may be useful, as well as consideration of MRI if indicated. Electronically Signed   By: Corrie Mckusick D.O.   On: 09/02/2020 11:51  Scheduled Meds: . aspirin EC  81 mg Oral Daily  . carvedilol  6.25 mg Oral BID WC  . diltiazem  300 mg Oral Daily  . docusate sodium  100 mg Oral BID  . dorzolamide-timolol  1 drop Both Eyes BID  . DULoxetine  60 mg Oral Daily  . ferrous sulfate  325 mg Oral Q breakfast  . gabapentin  300 mg Oral TID  . insulin aspart  0-5 Units Subcutaneous QHS  . insulin  aspart  0-9 Units Subcutaneous TID WC  . insulin glargine  10 Units Subcutaneous QHS  . latanoprost  1 drop Both Eyes QHS  . multivitamin with minerals  1 tablet Oral Daily  . omega-3 acid ethyl esters  1 g Oral Daily  . pantoprazole  40 mg Oral Daily  . rosuvastatin  10 mg Oral Daily   Continuous Infusions: . heparin 1,200 Units/hr (09/02/20 2147)     LOS: 1 day    Time spent: [place], MD [facility] To contact the attending provider between 7A-7P or the covering provider during after hours 7P-7A, please log into the web site [email or web address] and access using universal [place] password for that web site. If you do not have the password, please call the hospital operator.  09/03/2020, 10:39 AM

## 2020-09-03 NOTE — Plan of Care (Signed)

## 2020-09-04 DIAGNOSIS — I2699 Other pulmonary embolism without acute cor pulmonale: Secondary | ICD-10-CM | POA: Diagnosis not present

## 2020-09-04 LAB — CBC
HCT: 28.4 % — ABNORMAL LOW (ref 39.0–52.0)
Hemoglobin: 8.7 g/dL — ABNORMAL LOW (ref 13.0–17.0)
MCH: 28.5 pg (ref 26.0–34.0)
MCHC: 30.6 g/dL (ref 30.0–36.0)
MCV: 93.1 fL (ref 80.0–100.0)
Platelets: 243 10*3/uL (ref 150–400)
RBC: 3.05 MIL/uL — ABNORMAL LOW (ref 4.22–5.81)
RDW: 18.1 % — ABNORMAL HIGH (ref 11.5–15.5)
WBC: 5.7 10*3/uL (ref 4.0–10.5)
nRBC: 0 % (ref 0.0–0.2)

## 2020-09-04 LAB — HEPARIN LEVEL (UNFRACTIONATED): Heparin Unfractionated: 0.41 IU/mL (ref 0.30–0.70)

## 2020-09-04 LAB — GLUCOSE, CAPILLARY: Glucose-Capillary: 87 mg/dL (ref 70–99)

## 2020-09-04 MED ORDER — ACETAMINOPHEN 325 MG PO TABS: 650.0000 mg | ORAL_TABLET | Freq: Four times a day (QID) | ORAL | Status: AC | PRN

## 2020-09-04 MED ORDER — APIXABAN 5 MG PO TABS: 10.0000 mg | ORAL_TABLET | Freq: Two times a day (BID) | ORAL | 0 refills | Status: DC

## 2020-09-04 MED ORDER — APIXABAN 5 MG PO TABS
5.0000 mg | ORAL_TABLET | Freq: Two times a day (BID) | ORAL | 11 refills | Status: DC
Start: 2020-09-11 — End: 2020-09-12

## 2020-09-04 NOTE — Discharge Summary (Signed)
Physician Discharge Summary  Craig [name] ASN:[id number] DOB: Aug 31, 1946 DOA: 09/02/2020  PCP: Trinna Post, PA-C  Admit date: 09/02/2020 Discharge date: 09/04/2020  Time spent: 33 minutes  Recommendations for Outpatient Follow-up:  1. Eliquis started this admission with a drop down doses as per details below 2. Needs outpatient follow-up with PCP for labs in 1 week specifically hemoglobin to check for anemia in addition to follow-up with oncologist to discuss further management of cancer  Discharge Diagnoses:  Principal Problem:   Pulmonary embolism ([place]) Active Problems:   GERD (gastroesophageal reflux disease)   HLD (hyperlipidemia)   Essential (primary) hypertension   S/P CABG x 5   CAD (coronary artery disease)   Pancreatic cancer metastasized to liver ([place])   Anemia due to antineoplastic chemotherapy   Diabetes mellitus without complication ([place])   Depression   Chronic systolic CHF (congestive heart failure) ([place])   Discharge Condition: Improved  Diet recommendation: Heart healthy  Filed Weights   09/02/20 1300 09/03/20 0444 09/04/20 0414  Weight: 68.5 kg 68.3 kg 69.8 kg    History of present illness:  74 year old African-American male metastatic pancreatic CA 5-FU liposomal irinotecan  CABG X5 09/30/2017 EF 35-40% neuropathy HTN lumbago status post L4-5 decompression DM TY 2 with DKA diagnosed 01/26/2020-OP CT scan chest 11/19 by oncologist for SOB-found to have bilateral pulmonary emboli no right heart strain -Patient instructed by oncologist to present to ED-presented 7/23 Labs relatively stable other than BNP 161  Hospital Course:  1. Pulmonary emboli bilateral a. Diagnosed prior to admission 11/19 and is relatively hemodynamically stable b. Echocardiogram performed 11/24 no significant findings or pulmonary hypertension c. Received 2 days of IV heparin, transition to Eliquis with 10 mg twice daily dosing until 12/1 and then 5 mg twice daily  lifelong 2. Metastatic pancreatic CVA planning for alternate therapy with Dr. Tasia Catchings a. Will need outpatient discussions with her oncologist b. Xeloda from prior to admission has been held but resumed on discharge 3. Minimally elevated troponin a. In the setting of PE as well as active cancer would not work-up any further 4. 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 Blood sugars ranging 91-1 27 and relatively well controlled b. Resumed Lantus 20 and home regimen on discharge 5. CABG 03/15/2017 with chronic systolic HFrEF 19-37 a. Continue Coreg 6.25 twice daily, Cardizem 300 daily 6. HLD a. Continue Crestor 10, omega-3 7. HTN a. As above 8. Neuropathy in the setting of L4-5 surgery in the past a. Gabapentin as above 9. Depression a. Continue Cymbalta 60 daily 10. Reflux  Discharge Exam: Vitals:   09/04/20 0414 09/04/20 0740  BP: 116/64 136/65  Pulse: 79 70  Resp:    Temp: 98.3 F (36.8 C) 98.6 F (37 C)  SpO2: 99% 100%   Doing well sitting up in bed no fever no chills No dark stool no tarry stool no vomit   General: Awake alert coherent no distress sitting up in bed with no difficulty Ambulated around the unit without any issues Cardiovascular: S1-S2 no murmur no rub no gallop regular rate rhythm on monitors normal sinus rhythm Respiratory: Clinically clear no rales no rhonchi no wheeze Abdomen soft no rebound no guarding  Discharge Instructions   Discharge Instructions    Diet - low sodium heart healthy   Complete by: As directed    Discharge instructions   Complete by: As directed    Make sure that you take the blood thinner Eliquis correctly-you need 10 mg twice a day dose for  7 days ending on 09/10/2020 and then you will need the 5 mg twice a day dose which you should continue your lifelong-  You might be able to get a coupon to help you afford the Eliquis at least 85-month and this should be affordable going forward follow-up if there are any concerns about not being able to  get the medication  Be able to get a coupon to help you please take your Prilosec which is an acid reducer as you are also on aspirin and the combination of the Eliquis and the aspirin can cause irritation of your stomach We have not changed any of your other medications but you may need to follow-up very soon as we discussed with Dr. Tasia Catchings your oncologist who will help you determine next steps for your cancer treatment Recommend that you notify immediately any dark stool or tarry stools or vomiting of any dark blood   Increase activity slowly   Complete by: As directed      Allergies as of 09/04/2020      Reactions   Lipitor [atorvastatin] Other (See Comments)   MYALGIA   Zetia [ezetimibe] Other (See Comments)   MYALGIA   Lisinopril Cough   Protonix [pantoprazole] Other (See Comments)   headache   Spironolactone Other (See Comments)   Breast tenderness      Medication List    STOP taking these medications   Pen Needles 32G X 5 MM Misc     TAKE these medications   acetaminophen 325 MG tablet Commonly known as: TYLENOL Take 2 tablets (650 mg total) by mouth every 6 (six) hours as needed for fever, headache or moderate pain.   apixaban 5 MG Tabs tablet Commonly known as: ELIQUIS Take 2 tablets (10 mg total) by mouth 2 (two) times daily for 7 days.   apixaban 5 MG Tabs tablet Commonly known as: ELIQUIS Take 1 tablet (5 mg total) by mouth 2 (two) times daily. Start taking on: September 11, 2020   aspirin EC 81 MG tablet Take 81 mg by mouth daily.   capecitabine 500 MG tablet Commonly known as: XELODA Take 3 tablets (1,500 mg total) by mouth 2 (two) times daily after a meal. Take for 14 days on, 7 days off. Repeat every 21 days.   carvedilol 6.25 MG tablet Commonly known as: COREG Take 6.25 mg by mouth 2 (two) times daily with a meal.   diltiazem 300 MG 24 hr capsule Commonly known as: CARDIZEM CD TAKE 1 CAPSULE BY MOUTH EVERY DAY   docusate sodium 100 MG  capsule Commonly known as: Colace Take 1 capsule (100 mg total) by mouth 2 (two) times daily.   dorzolamide-timolol 22.3-6.8 MG/ML ophthalmic solution Commonly known as: COSOPT INSTILL ONE DROP INTO BOTH EYES TWICE DAILY   DULoxetine 30 MG capsule Commonly known as: CYMBALTA TAKE 2 CAPSULES BY MOUTH EVERY DAY   ferrous sulfate 325 (65 FE) MG tablet TAKE 1 TABLET BY MOUTH EVERY DAY WITH BREAKFAST   gabapentin 300 MG capsule Commonly known as: NEURONTIN Take 300 mg by mouth 3 (three) times daily.   insulin lispro 100 UNIT/ML KwikPen Commonly known as: HUMALOG Inject 0.05 mLs (5 Units total) into the skin 3 (three) times daily with meals.   Lantus SoloStar 100 UNIT/ML Solostar Pen Generic drug: insulin glargine Inject 20 Units into the skin at bedtime.   latanoprost 0.005 % ophthalmic solution Commonly known as: XALATAN Place 1 drop into both eyes at bedtime.   lidocaine-prilocaine cream Commonly  known as: EMLA APPLY TO AFFECTED AREA ONCE AS DIRECTED   multivitamin with minerals Tabs tablet Take 1 tablet by mouth daily.   OMEGA-3 FATTY ACIDS PO Take 1,200 mg by mouth daily.   omeprazole 40 MG capsule Commonly known as: PRILOSEC TAKE 1 CAPSULE BY MOUTH EVERY DAY   OneTouch Verio test strip Generic drug: glucose blood TEST FASTING SUGAR EVERY MORNING   rosuvastatin 10 MG tablet Commonly known as: CRESTOR Take 1 tablet by mouth daily.   triamcinolone ointment 0.5 % Commonly known as: KENALOG APPLY 1 APPLICATION TOPICALLY 3 (THREE) TIMES DAILY.      Allergies  Allergen Reactions  . Lipitor [Atorvastatin] Other (See Comments)    MYALGIA  . Zetia [Ezetimibe] Other (See Comments)    MYALGIA  . Lisinopril Cough  . Protonix [Pantoprazole] Other (See Comments)    headache  . Spironolactone Other (See Comments)    Breast tenderness      The results of significant diagnostics from this hospitalization (including imaging, microbiology, ancillary and  laboratory) are listed below for reference.    Significant Diagnostic Studies: DG Chest 2 View  Result Date: 09/02/2020 CLINICAL DATA:  Tachycardia EXAM: CHEST - 2 VIEW COMPARISON:  None. FINDINGS: The heart size and mediastinal contours are within normal limits. Both lungs are clear. The visualized skeletal structures are unremarkable. Right chest wall Port-A-Cath with tip at the cavoatrial junction. IMPRESSION: No active cardiopulmonary disease. Electronically Signed   By: Ulyses Jarred M.D.   On: 09/02/2020 03:28   DG Chest 2 View  Result Date: 08/25/2020 CLINICAL DATA:  Shortness of breath. EXAM: CHEST - 2 VIEW COMPARISON:  01/23/2018 FINDINGS: The right IJ Port-A-Cath is in good position. Stable tortuosity, ectasia and calcification of the thoracic aorta. Stable surgical changes from triple bypass surgery. The lungs are clear of an acute process. No pleural effusions or pulmonary edema. The bony thorax is intact. IMPRESSION: No acute cardiopulmonary findings. Electronically Signed   By: Marijo Sanes M.D.   On: 08/25/2020 16:19   DG Abd 1 View  Result Date: 08/25/2020 CLINICAL DATA:  Abdominal pain. EXAM: ABDOMEN - 1 VIEW COMPARISON:  01/03/2014 FINDINGS: There is moderate stool throughout the colon and down into the rectum suggesting constipation. No distended small bowel loops are identified. No free air. The bony structures are intact. Lumbar fusion hardware noted. Vascular calcifications are noted. IMPRESSION: Moderate stool throughout the colon suggesting constipation. Electronically Signed   By: Marijo Sanes M.D.   On: 08/25/2020 16:21   CT CHEST ABDOMEN PELVIS W CONTRAST  Result Date: 08/31/2020 CLINICAL DATA:  Metastatic pancreatic cancer. Shortness of breath. EXAM: CT CHEST, ABDOMEN, AND PELVIS WITH CONTRAST TECHNIQUE: Multidetector CT imaging of the chest, abdomen and pelvis was performed following the standard protocol during bolus administration of intravenous contrast.  CONTRAST:  142mL OMNIPAQUE IOHEXOL 300 MG/ML  SOLN COMPARISON:  CT scan 06/04/2020 FINDINGS: CT CHEST FINDINGS Cardiovascular: The heart is normal in size. No pericardial effusion. Stable tortuosity, mild ectasia and calcification of the thoracic aorta. Stable branch vessel calcifications including three-vessel coronary artery calcifications. Stable surgical changes from bypass surgery. Mediastinum/Nodes: There are bilateral pulmonary emboli. No evidence of right heart strain. The RV LV ratio is less than 1. Small scattered mediastinal and hilar lymph nodes are stable. None of these measures more than 8 mm. Lungs/Pleura: Stable 7 mm irregular nodule at the left lung base. 3 mm subpleural pulmonary nodule along the major fissure on image 85/3 is slightly more pronounced but I  think this is due to breathing motion artifact. Similar finding involving the 3 mm nodule along the right minor fissure. 5 mm subpleural nodule in the right upper lobe on image number 72/3 previously measured 3 mm. Subpleural atelectasis noted in the right upper lobe posteriorly which may be related to the patient's pulmonary embolism. No pleural effusions. Musculoskeletal: No significant bony findings. No evidence of osseous metastatic disease. CT ABDOMEN PELVIS FINDINGS Hepatobiliary: Progressive hepatic metastatic disease. Enlarging/coalescing mass in segment 8 on image 53/2 measures 5.9 x 5.5 cm and previously measured approximately 4.4 x 4.2 cm. Segment 7 lesion on image 51/2 measures 6.5 x 4.7 cm and previously measured 4.6 x 3.6 cm. Segment 6 lesion on image 63/2 measures 5.5 x 4.5 cm and previously measured 4.4 x 3.4 cm. Pancreas: Pancreatic head mass measures 2.8 x 2.7 cm and previously measured 3.1 x 2.9 cm. Marked atrophy of the remainder of the gland is again noted. Spleen: Normal size. No focal lesions. Adrenals/Urinary Tract: The adrenal glands and kidneys are stable. Bilateral renal cysts are noted. The midpole left renal cyst  posteriorly is partially hemorrhagic. Stomach/Bowel: The stomach, duodenum, small bowel and colon are grossly normal without oral contrast. No inflammatory changes, mass lesions or obstructive findings. The appendix is normal. Vascular/Lymphatic: Severe atherosclerotic calcifications involving the aorta, iliac arteries and branch vessels. The right internal iliac artery is occluded proximally. No mesenteric or retroperitoneal mass or adenopathy. Reproductive: The prostate gland and seminal vesicles are unremarkable. Other: No pelvic mass or adenopathy. No free pelvic fluid collections. No inguinal mass or adenopathy. No abdominal wall hernia or subcutaneous lesions. Musculoskeletal: No significant bony findings. Remote lumbar fusion changes. IMPRESSION: 1. Bilateral pulmonary emboli. No evidence of right heart strain. 2. Progressive hepatic metastatic disease. 3. Slight interval decrease in size of the pancreatic head mass. 4. Stable advanced atherosclerotic calcifications involving the thoracic and abdominal aorta and branch vessels including the coronary arteries. 5. Stable pulmonary nodules. 6. Aortic atherosclerosis. These results will be called to the ordering clinician or representative by the Radiologist Assistant, and communication documented in the PACS or [employer]. Aortic Atherosclerosis (ICD10-I70.0). Electronically Signed   By: Marijo Sanes M.D.   On: 08/31/2020 15:05   US Venous Img Lower Bilateral (DVT)  Result Date: 09/02/2020 CLINICAL DATA:  74 year old male with a history prior pulmonary embolism EXAM: BILATERAL LOWER EXTREMITY VENOUS DOPPLER ULTRASOUND TECHNIQUE: Gray-scale sonography with graded compression, as well as color Doppler and duplex ultrasound were performed to evaluate the lower extremity deep venous systems from the level of the common femoral vein and including the common femoral, femoral, profunda femoral, popliteal and calf veins including the posterior tibial,  peroneal and gastrocnemius veins when visible. The superficial great saphenous vein was also interrogated. Spectral Doppler was utilized to evaluate flow at rest and with distal augmentation maneuvers in the common femoral, femoral and popliteal veins. COMPARISON:  None. FINDINGS: RIGHT LOWER EXTREMITY Common Femoral Vein: No evidence of thrombus. Normal compressibility, respiratory phasicity and response to augmentation. Saphenofemoral Junction: No evidence of thrombus. Normal compressibility and flow on color Doppler imaging. Profunda Femoral Vein: No evidence of thrombus. Normal compressibility and flow on color Doppler imaging. Femoral Vein: No evidence of thrombus. Normal compressibility, respiratory phasicity and response to augmentation. Popliteal Vein: Partially compressible popliteal vein without occlusion. Calf Veins: No evidence of thrombus. Normal compressibility and flow on color Doppler imaging. Superficial Great Saphenous Vein: No evidence of thrombus. Normal compressibility and flow on color Doppler imaging. Other Findings:  None. LEFT LOWER EXTREMITY Common Femoral Vein: No evidence of thrombus. Normal compressibility, respiratory phasicity and response to augmentation. Saphenofemoral Junction: No evidence of thrombus. Normal compressibility and flow on color Doppler imaging. Profunda Femoral Vein: No evidence of thrombus. Normal compressibility and flow on color Doppler imaging. Femoral Vein: No evidence of thrombus. Normal compressibility, respiratory phasicity and response to augmentation. Popliteal Vein: No evidence of thrombus. Normal compressibility, respiratory phasicity and response to augmentation. Calf Veins: No evidence of thrombus. Normal compressibility and flow on color Doppler imaging. Superficial Great Saphenous Vein: No evidence of thrombus. Normal compressibility and flow on color Doppler imaging. Other Findings: Lentiform heterogeneously echoic soft tissue within the thigh  measures 4.1 cm x 1.6 cm x 5.1 cm. No internal color flow. IMPRESSION: Sonographic survey of the right lower extremity positive for nonocclusive DVT of the popliteal vein. Sonographic survey of the left lower extremity negative for DVT. Lentiform soft tissue lesion in the left thigh measures 5.1 cm. Ultrasound characteristics are nonspecific, and this may represent a lipoma. Note that low-grade liposarcoma cannot be excluded on ultrasound. Correlation with patient presentation may be useful, as well as consideration of MRI if indicated. Electronically Signed   By: Corrie Mckusick D.O.   On: 09/02/2020 11:51   ECHOCARDIOGRAM COMPLETE  Result Date: 09/03/2020    ECHOCARDIOGRAM REPORT   Patient Name:   Toua TANKARD Date of Exam: 09/02/2020 Medical Rec #:  [id number]       Height:       69.0 in Accession #:    [id number]      Weight:       160.0 lb Date of Birth:  1946-01-29       BSA:          1.879 m Patient Age:    53 years        BP:           165/84 mmHg Patient Gender: M               HR:           85 bpm. Exam Location:  [facility] Procedure: 2D Echo, Color Doppler and Cardiac Doppler Indications:     I26.99 Pulmonary Embolus  History:         Patient has prior history of Echocardiogram examinations. CHF,                  CAD, Prior CABG; Risk Factors:Hypertension and Dyslipidemia.  Sonographer:     Charmayne Sheer RDCS (AE) Referring Phys:  [id number] Ivor Costa Diagnosing Phys: Yolonda Kida MD  Sonographer Comments: Suboptimal subcostal window. IMPRESSIONS  1. Left ventricular ejection fraction, by estimation, is 55 to 60%. The left ventricle has normal function. The left ventricle has no regional wall motion abnormalities. Left ventricular diastolic parameters are consistent with Grade I diastolic dysfunction (impaired relaxation).  2. Right ventricular systolic function is normal. The right ventricular size is normal.  3. The mitral valve is normal in structure. No evidence of mitral valve regurgitation.  4. The  aortic valve is grossly normal. There is mild calcification of the aortic valve. There is mild thickening of the aortic valve. Aortic valve regurgitation is not visualized. Mild to moderate aortic valve sclerosis/calcification is present, without  any evidence of aortic stenosis. FINDINGS  Left Ventricle: Left ventricular ejection fraction, by estimation, is 55 to 60%. The left ventricle has normal function. The left ventricle has no regional wall motion abnormalities. The left ventricular internal cavity  size was normal in size. There is  no left ventricular hypertrophy. Left ventricular diastolic parameters are consistent with Grade I diastolic dysfunction (impaired relaxation). Right Ventricle: The right ventricular size is normal. No increase in right ventricular wall thickness. Right ventricular systolic function is normal. Left Atrium: Left atrial size was normal in size. Right Atrium: Right atrial size was normal in size. Pericardium: There is no evidence of pericardial effusion. Mitral Valve: The mitral valve is normal in structure. No evidence of mitral valve regurgitation. MV peak gradient, 5.0 mmHg. The mean mitral valve gradient is 2.0 mmHg. Tricuspid Valve: The tricuspid valve is normal in structure. Tricuspid valve regurgitation is not demonstrated. Aortic Valve: The aortic valve is grossly normal. There is mild calcification of the aortic valve. There is mild thickening of the aortic valve. There is mild aortic valve annular calcification. Aortic valve regurgitation is not visualized. Mild to moderate aortic valve sclerosis/calcification is present, without any evidence of aortic stenosis. Aortic valve mean gradient measures 3.0 mmHg. Aortic valve peak gradient measures 5.3 mmHg. Aortic valve area, by VTI measures 1.71 cm. Pulmonic Valve: The pulmonic valve was grossly normal. Pulmonic valve regurgitation is not visualized. Aorta: The ascending aorta was not well visualized. IAS/Shunts: No atrial  level shunt detected by color flow Doppler.  LEFT VENTRICLE PLAX 2D LVIDd:         4.06 cm  Diastology LVIDs:         2.85 cm  LV e' medial:    5.33 cm/s LV PW:         0.97 cm  LV E/e' medial:  14.0 LV IVS:        0.78 cm  LV e' lateral:   8.05 cm/s LVOT diam:     2.00 cm  LV E/e' lateral: 9.3 LV SV:         33 LV SV Index:   17 LVOT Area:     3.14 cm  RIGHT VENTRICLE RV Basal diam:  2.50 cm RV Mid diam:    3.64 cm LEFT ATRIUM           Index       RIGHT ATRIUM          Index LA diam:      2.90 cm 1.54 cm/m  RA Area:     8.63 cm LA Vol (A2C): 26.3 ml 14.00 ml/m RA Volume:   13.40 ml 7.13 ml/m LA Vol (A4C): 24.5 ml 13.04 ml/m  AORTIC VALVE                   PULMONIC VALVE AV Area (Vmax):    1.87 cm    PV Vmax:       0.67 m/s AV Area (Vmean):   1.89 cm    PV Vmean:      50.000 cm/s AV Area (VTI):     1.71 cm    PV VTI:        0.118 m AV Vmax:           115.00 cm/s PV Peak grad:  1.8 mmHg AV Vmean:          76.500 cm/s PV Mean grad:  1.0 mmHg AV VTI:            0.191 m AV Peak Grad:      5.3 mmHg AV Mean Grad:      3.0 mmHg LVOT Vmax:         68.50 cm/s LVOT Vmean:  46.100 cm/s LVOT VTI:          0.104 m LVOT/AV VTI ratio: 0.54  AORTA Ao Root diam: 3.10 cm MITRAL VALVE MV Area (PHT): 5.20 cm     SHUNTS MV Peak grad:  5.0 mmHg     Systemic VTI:  0.10 m MV Mean grad:  2.0 mmHg     Systemic Diam: 2.00 cm MV Vmax:       1.12 m/s MV Vmean:      59.6 cm/s MV Decel Time: 146 msec MV E velocity: 74.60 cm/s MV A velocity: 110.00 cm/s MV E/A ratio:  0.68 Dwayne D Callwood MD Electronically signed by Yolonda Kida MD Signature Date/Time: 09/03/2020/4:31:23 PM    Final     Microbiology: Recent Results (from the past 240 hour(s))  Resp Panel by RT-PCR (Flu A&B, Covid) Nasopharyngeal Swab     Status: None   Collection Time: 09/02/20  2:37 AM   Specimen: Nasopharyngeal Swab; Nasopharyngeal(NP) swabs in vial transport medium  Result Value Ref Range Status   SARS Coronavirus 2 by RT PCR NEGATIVE NEGATIVE  Final    Comment: (NOTE) SARS-CoV-2 target nucleic acids are NOT DETECTED.  The SARS-CoV-2 RNA is generally detectable in upper respiratory specimens during the acute phase of infection. The lowest concentration of SARS-CoV-2 viral copies this assay can detect is 138 copies/mL. A negative result does not preclude SARS-Cov-2 infection and should not be used as the sole basis for treatment or other patient management decisions. A negative result may occur with  improper specimen collection/handling, submission of specimen other than nasopharyngeal swab, presence of viral mutation(s) within the areas targeted by this assay, and inadequate number of viral copies(<138 copies/mL). A negative result must be combined with clinical observations, patient history, and epidemiological information. The expected result is Negative.  Fact Sheet for Patients:  EntrepreneurPulse.com.au  Fact Sheet for Healthcare Providers:  IncredibleEmployment.be  This test is no t yet approved or cleared by the [place] FDA and  has been authorized for detection and/or diagnosis of SARS-CoV-2 by FDA under an Emergency Use Authorization (EUA). This EUA will remain  in effect (meaning this test can be used) for the duration of the COVID-19 declaration under Section 564(b)(1) of the Act, 21 U.S.C.section 360bbb-3(b)(1), unless the authorization is terminated  or revoked sooner.       Influenza A by PCR NEGATIVE NEGATIVE Final   Influenza B by PCR NEGATIVE NEGATIVE Final    Comment: (NOTE) The Xpert Xpress SARS-CoV-2/FLU/RSV plus assay is intended as an aid in the diagnosis of influenza from Nasopharyngeal swab specimens and should not be used as a sole basis for treatment. Nasal washings and aspirates are unacceptable for Xpert Xpress SARS-CoV-2/FLU/RSV testing.  Fact Sheet for Patients: EntrepreneurPulse.com.au  Fact Sheet for Healthcare  Providers: IncredibleEmployment.be  This test is not yet approved or cleared by the [place] FDA and has been authorized for detection and/or diagnosis of SARS-CoV-2 by FDA under an Emergency Use Authorization (EUA). This EUA will remain in effect (meaning this test can be used) for the duration of the COVID-19 declaration under Section 564(b)(1) of the Act, 21 U.S.C. section 360bbb-3(b)(1), unless the authorization is terminated or revoked.  Performed at [facility], [address] [place],  [address]      Labs: Basic Metabolic Panel: Recent Labs  Lab 09/02/20 0237  NA 138  K 3.5  CL 104  CO2 22  GLUCOSE 127*  BUN 14  CREATININE 0.91  CALCIUM  9.1  MG 1.8   Liver Function Tests: Recent Labs  Lab 09/02/20 0237  AST 39  ALT 22  ALKPHOS 174*  BILITOT 1.0  PROT 6.6  ALBUMIN 3.0*   No results for input(s): LIPASE, AMYLASE in the last 168 hours. No results for input(s): AMMONIA in the last 168 hours. CBC: Recent Labs  Lab 09/02/20 0237 09/03/20 0432 09/04/20 0711  WBC 7.5 6.1 5.7  NEUTROABS 4.9  --   --   HGB 9.6* 9.0* 8.7*  HCT 31.1* 29.1* 28.4*  MCV 94.0 93.6 93.1  PLT 200 221 243   Cardiac Enzymes: No results for input(s): CKTOTAL, CKMB, CKMBINDEX, TROPONINI in the last 168 hours. BNP: BNP (last 3 results) Recent Labs    09/02/20 0237  BNP 161.9*    ProBNP (last 3 results) No results for input(s): PROBNP in the last 8760 hours.  CBG: Recent Labs  Lab 09/03/20 0746 09/03/20 1159 09/03/20 1704 09/03/20 2059 09/04/20 0743  GLUCAP 91 131* 108* 114* 87       Signed:  Nita Sells MD   [facility] 09/04/2020, 10:33 AM

## 2020-09-04 NOTE — Progress Notes (Signed)
Discharge instructions explained to pt / verbalized an understanding/ iv and tele removed/ will transport off unit via wheelchair when ride arrives.  

## 2020-09-04 NOTE — Progress Notes (Signed)
ANTICOAGULATION CONSULT NOTE   Pharmacy Consult for Heparin/apixaban Indication: pulmonary embolus and DVT  Patient Measurements: Height: 5\' 9"  (175.3 cm) Weight: 69.8 kg (153 lb 12.8 oz) IBW/kg (Calculated) : 70.7 HEPARIN DW (KG): 68.5  Labs: Recent Labs    09/02/20 0237 09/02/20 0237 09/02/20 0418 09/02/20 1330 09/02/20 2233 09/03/20 0432 09/04/20 0711  HGB 9.6*   < >  --   --   --  9.0* 8.7*  HCT 31.1*  --   --   --   --  29.1* 28.4*  PLT 200  --   --   --   --  221 243  APTT 35  --   --   --   --   --   --   LABPROT 13.7  --   --   --   --   --   --   INR 1.1  --   --   --   --   --   --   HEPARINUNFRC  --   --   --    < > 0.59 0.53 0.41  CREATININE 0.91  --   --   --   --   --   --   TROPONINIHS 18*  --  15  --   --   --   --    < > = values in this interval not displayed.    Estimated Creatinine Clearance: 71.4 mL/min (by C-G formula based on SCr of 0.91 mg/dL).   Medical History: Past Medical History:  Diagnosis Date  . Allergic rhinitis   . CHF (congestive heart failure) ([place])   . Chronic cholecystitis   . Coronary artery disease   . DDD (degenerative disc disease), cervical   . Dehydration 06/21/2019  . Diabetes mellitus without complication ([place])   . Dyspnea   . Early cataract   . Erectile dysfunction   . Family history of brain cancer   . Family history of cancer   . GERD (gastroesophageal reflux disease)   . Gouty arthritis   . Headache    occasional migraines  . Hypercalcemia   . Hyperlipemia   . Hypertension   . Palpitations   . Pancreatic cancer metastasized to liver ([place]) 05/2019   Chemo tx's  . Peripheral vascular disease ([place])   . 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 Spinal stenosis of cervical region   . Vitamin D deficiency     Assessment: Heparin for PE. Baseline aPTT 35s, INR 1.1.  No anticoagulants PTA per med list.  11/23 1330 HL 0.66 11/23  2233 HL 0.59 11/24 0432 HL 0.53, therapeutic x 3 11/25 0711 HL 0.41  Goal of Therapy:  Heparin level 0.3-0.7 units/ml Monitor platelets by anticoagulation protocol: Yes   Plan:  Heparin level is therapeutic. Will continue heparin at 1200 units/hr until 0957 and switch to apixaban per discussion in progression rounds 11/24.  Oswald Hillock 09/04/2020,8:03 AM

## 2020-09-07 ENCOUNTER — Other Ambulatory Visit: Payer: Self-pay

## 2020-09-07 ENCOUNTER — Inpatient Hospital Stay: Payer: Medicare Other

## 2020-09-07 ENCOUNTER — Emergency Department: Payer: Medicare Other

## 2020-09-07 ENCOUNTER — Encounter: Payer: Self-pay | Admitting: Emergency Medicine

## 2020-09-07 ENCOUNTER — Observation Stay
Admission: EM | Admit: 2020-09-07 | Discharge: 2020-09-08 | Disposition: A | Discharge status: Home / Self Care | Payer: Medicare Other | Attending: Internal Medicine | Admitting: Internal Medicine

## 2020-09-07 DIAGNOSIS — C787 Secondary malignant neoplasm of liver and intrahepatic bile duct: Secondary | ICD-10-CM | POA: Diagnosis present

## 2020-09-07 DIAGNOSIS — Z8507 Personal history of malignant neoplasm of pancreas: Secondary | ICD-10-CM | POA: Diagnosis not present

## 2020-09-07 DIAGNOSIS — I10 Essential (primary) hypertension: Secondary | ICD-10-CM | POA: Diagnosis not present

## 2020-09-07 DIAGNOSIS — Z20822 Contact with and (suspected) exposure to covid-19: Secondary | ICD-10-CM | POA: Insufficient documentation

## 2020-09-07 DIAGNOSIS — I5022 Chronic systolic (congestive) heart failure: Secondary | ICD-10-CM | POA: Insufficient documentation

## 2020-09-07 DIAGNOSIS — Z7982 Long term (current) use of aspirin: Secondary | ICD-10-CM | POA: Diagnosis not present

## 2020-09-07 DIAGNOSIS — Z8505 Personal history of malignant neoplasm of liver: Secondary | ICD-10-CM | POA: Insufficient documentation

## 2020-09-07 DIAGNOSIS — Z87891 Personal history of nicotine dependence: Secondary | ICD-10-CM | POA: Diagnosis not present

## 2020-09-07 DIAGNOSIS — I110 Hypertensive heart disease with heart failure: Secondary | ICD-10-CM | POA: Diagnosis not present

## 2020-09-07 DIAGNOSIS — Z7901 Long term (current) use of anticoagulants: Secondary | ICD-10-CM | POA: Insufficient documentation

## 2020-09-07 DIAGNOSIS — I2510 Atherosclerotic heart disease of native coronary artery without angina pectoris: Secondary | ICD-10-CM | POA: Insufficient documentation

## 2020-09-07 DIAGNOSIS — C259 Malignant neoplasm of pancreas, unspecified: Secondary | ICD-10-CM | POA: Diagnosis not present

## 2020-09-07 DIAGNOSIS — R29898 Other symptoms and signs involving the musculoskeletal system: Secondary | ICD-10-CM

## 2020-09-07 DIAGNOSIS — Z79899 Other long term (current) drug therapy: Secondary | ICD-10-CM | POA: Insufficient documentation

## 2020-09-07 DIAGNOSIS — I2699 Other pulmonary embolism without acute cor pulmonale: Secondary | ICD-10-CM | POA: Diagnosis present

## 2020-09-07 DIAGNOSIS — E119 Type 2 diabetes mellitus without complications: Secondary | ICD-10-CM | POA: Diagnosis not present

## 2020-09-07 DIAGNOSIS — Z794 Long term (current) use of insulin: Secondary | ICD-10-CM | POA: Diagnosis not present

## 2020-09-07 DIAGNOSIS — R202 Paresthesia of skin: Secondary | ICD-10-CM | POA: Diagnosis present

## 2020-09-07 DIAGNOSIS — Z951 Presence of aortocoronary bypass graft: Secondary | ICD-10-CM | POA: Diagnosis not present

## 2020-09-07 DIAGNOSIS — I639 Cerebral infarction, unspecified: Principal | ICD-10-CM | POA: Insufficient documentation

## 2020-09-07 LAB — URINALYSIS, ROUTINE W REFLEX MICROSCOPIC
Bilirubin Urine: NEGATIVE
Glucose, UA: NEGATIVE mg/dL
Hgb urine dipstick: NEGATIVE
Ketones, ur: NEGATIVE mg/dL
Leukocytes,Ua: NEGATIVE
Nitrite: NEGATIVE
Protein, ur: NEGATIVE mg/dL
Specific Gravity, Urine: 1.02 (ref 1.005–1.030)
pH: 7 (ref 5.0–8.0)

## 2020-09-07 LAB — URINE DRUG SCREEN, QUALITATIVE (ARMC ONLY)
Amphetamines, Ur Screen: NOT DETECTED
Barbiturates, Ur Screen: NOT DETECTED
Benzodiazepine, Ur Scrn: NOT DETECTED
Cannabinoid 50 Ng, Ur ~~LOC~~: NOT DETECTED
Cocaine Metabolite,Ur ~~LOC~~: NOT DETECTED
MDMA (Ecstasy)Ur Screen: NOT DETECTED
Methadone Scn, Ur: NOT DETECTED
Opiate, Ur Screen: NOT DETECTED
Phencyclidine (PCP) Ur S: NOT DETECTED

## 2020-09-07 LAB — CBC WITH DIFFERENTIAL/PLATELET
Abs Immature Granulocytes: 0.05 10*3/uL (ref 0.00–0.07)
Basophils Absolute: 0 10*3/uL (ref 0.0–0.1)
Basophils Relative: 1 %
Eosinophils Absolute: 0.2 10*3/uL (ref 0.0–0.5)
Eosinophils Relative: 3 %
HCT: 27.1 % — ABNORMAL LOW (ref 39.0–52.0)
Hemoglobin: 8.5 g/dL — ABNORMAL LOW (ref 13.0–17.0)
Immature Granulocytes: 1 %
Lymphocytes Relative: 13 %
Lymphs Abs: 0.7 10*3/uL (ref 0.7–4.0)
MCH: 29.2 pg (ref 26.0–34.0)
MCHC: 31.4 g/dL (ref 30.0–36.0)
MCV: 93.1 fL (ref 80.0–100.0)
Monocytes Absolute: 1.2 10*3/uL — ABNORMAL HIGH (ref 0.1–1.0)
Monocytes Relative: 22 %
Neutro Abs: 3.5 10*3/uL (ref 1.7–7.7)
Neutrophils Relative %: 60 %
Platelets: 222 10*3/uL (ref 150–400)
RBC: 2.91 MIL/uL — ABNORMAL LOW (ref 4.22–5.81)
RDW: 17.8 % — ABNORMAL HIGH (ref 11.5–15.5)
WBC: 5.7 10*3/uL (ref 4.0–10.5)
nRBC: 0 % (ref 0.0–0.2)

## 2020-09-07 LAB — APTT: aPTT: 33 seconds (ref 24–36)

## 2020-09-07 LAB — COMPREHENSIVE METABOLIC PANEL
ALT: 19 U/L (ref 0–44)
AST: 38 U/L (ref 15–41)
Albumin: 2.7 g/dL — ABNORMAL LOW (ref 3.5–5.0)
Alkaline Phosphatase: 160 U/L — ABNORMAL HIGH (ref 38–126)
Anion gap: 11 (ref 5–15)
BUN: 10 mg/dL (ref 8–23)
CO2: 22 mmol/L (ref 22–32)
Calcium: 8.6 mg/dL — ABNORMAL LOW (ref 8.9–10.3)
Chloride: 103 mmol/L (ref 98–111)
Creatinine, Ser: 0.69 mg/dL (ref 0.61–1.24)
GFR, Estimated: >60 mL/min (ref >60)
Glucose, Bld: 118 mg/dL — ABNORMAL HIGH (ref 70–99)
Potassium: 3.2 mmol/L — ABNORMAL LOW (ref 3.5–5.1)
Sodium: 136 mmol/L (ref 135–145)
Total Bilirubin: 0.9 mg/dL (ref 0.3–1.2)
Total Protein: 6 g/dL — ABNORMAL LOW (ref 6.5–8.1)

## 2020-09-07 LAB — PROTIME-INR
INR: 1.5 — ABNORMAL HIGH (ref 0.8–1.2)
Prothrombin Time: 17.7 seconds — ABNORMAL HIGH (ref 11.4–15.2)

## 2020-09-07 LAB — HEMOGLOBIN A1C
Hgb A1c MFr Bld: 5.4 % (ref 4.8–5.6)
Mean Plasma Glucose: 108.28 mg/dL

## 2020-09-07 LAB — CBG MONITORING, ED
Glucose-Capillary: 109 mg/dL — ABNORMAL HIGH (ref 70–99)
Glucose-Capillary: 110 mg/dL — ABNORMAL HIGH (ref 70–99)

## 2020-09-07 LAB — RESP PANEL BY RT-PCR (FLU A&B, COVID) ARPGX2
Influenza A by PCR: NEGATIVE
Influenza B by PCR: NEGATIVE
SARS Coronavirus 2 by RT PCR: NEGATIVE

## 2020-09-07 LAB — ETHANOL: Alcohol, Ethyl (B): <10 mg/dL (ref <10)

## 2020-09-07 LAB — GLUCOSE, CAPILLARY
Glucose-Capillary: 111 mg/dL — ABNORMAL HIGH (ref 70–99)
Glucose-Capillary: 85 mg/dL (ref 70–99)

## 2020-09-07 MED ORDER — APIXABAN 5 MG PO TABS: 5.0000 mg | ORAL_TABLET | Freq: Two times a day (BID) | ORAL | Status: DC

## 2020-09-07 MED ORDER — LIDOCAINE-PRILOCAINE 2.5-2.5 % EX CREA
TOPICAL_CREAM | Freq: Once | CUTANEOUS | Status: DC | PRN
  Filled 2020-09-07: qty 5

## 2020-09-07 MED ORDER — LABETALOL HCL 5 MG/ML IV SOLN: 10.0000 mg | Freq: Four times a day (QID) | INTRAVENOUS | Status: DC | PRN

## 2020-09-07 MED ORDER — CAPECITABINE 500 MG PO TABS: 1500.0000 mg | ORAL_TABLET | Freq: Two times a day (BID) | ORAL | Status: DC

## 2020-09-07 MED ORDER — SENNOSIDES-DOCUSATE SODIUM 8.6-50 MG PO TABS: 1.0000 | ORAL_TABLET | Freq: Every evening | ORAL | Status: DC | PRN

## 2020-09-07 MED ORDER — LATANOPROST 0.005 % OP SOLN
1.0000 [drp] | Freq: Every day | OPHTHALMIC | Status: DC
  Administered 2020-09-07: 23:00:00 1 [drp] via OPHTHALMIC
  Filled 2020-09-07: qty 2.5

## 2020-09-07 MED ORDER — ADULT MULTIVITAMIN W/MINERALS CH
1.0000 | ORAL_TABLET | Freq: Every day | ORAL | Status: DC
  Administered 2020-09-07 – 2020-09-08 (×2): 1 via ORAL
  Filled 2020-09-07 (×2): qty 1

## 2020-09-07 MED ORDER — PANTOPRAZOLE SODIUM 40 MG PO TBEC
40.0000 mg | DELAYED_RELEASE_TABLET | Freq: Every day | ORAL | Status: DC
  Administered 2020-09-07 – 2020-09-08 (×2): 40 mg via ORAL
  Filled 2020-09-07 (×2): qty 1

## 2020-09-07 MED ORDER — DORZOLAMIDE HCL-TIMOLOL MAL 2-0.5 % OP SOLN
1.0000 [drp] | Freq: Two times a day (BID) | OPHTHALMIC | Status: DC
  Administered 2020-09-07 – 2020-09-08 (×2): 1 [drp] via OPHTHALMIC
  Filled 2020-09-07: qty 10

## 2020-09-07 MED ORDER — STROKE: EARLY STAGES OF RECOVERY BOOK: Freq: Once | Status: DC

## 2020-09-07 MED ORDER — APIXABAN 5 MG PO TABS: 10.0000 mg | ORAL_TABLET | Freq: Two times a day (BID) | ORAL | Status: DC

## 2020-09-07 MED ORDER — ASPIRIN EC 81 MG PO TBEC
81.0000 mg | DELAYED_RELEASE_TABLET | Freq: Every day | ORAL | Status: DC
  Administered 2020-09-07 – 2020-09-08 (×2): 81 mg via ORAL
  Filled 2020-09-07 (×2): qty 1

## 2020-09-07 MED ORDER — INSULIN ASPART 100 UNIT/ML ~~LOC~~ SOLN
0.0000 [IU] | SUBCUTANEOUS | Status: DC
  Administered 2020-09-08: 3 [IU] via SUBCUTANEOUS
  Filled 2020-09-07: qty 1

## 2020-09-07 MED ORDER — GABAPENTIN 300 MG PO CAPS
300.0000 mg | ORAL_CAPSULE | Freq: Three times a day (TID) | ORAL | Status: DC
  Administered 2020-09-07 – 2020-09-08 (×5): 300 mg via ORAL
  Filled 2020-09-07 (×5): qty 1

## 2020-09-07 MED ORDER — DOCUSATE SODIUM 100 MG PO CAPS
100.0000 mg | ORAL_CAPSULE | Freq: Two times a day (BID) | ORAL | Status: DC
  Administered 2020-09-07 – 2020-09-08 (×2): 100 mg via ORAL
  Filled 2020-09-07 (×3): qty 1

## 2020-09-07 MED ORDER — FERROUS SULFATE 325 (65 FE) MG PO TABS
325.0000 mg | ORAL_TABLET | Freq: Two times a day (BID) | ORAL | Status: DC
  Administered 2020-09-08 (×2): 325 mg via ORAL
  Filled 2020-09-07 (×2): qty 1

## 2020-09-07 MED ORDER — IOHEXOL 350 MG/ML SOLN
75.0000 mL | Freq: Once | INTRAVENOUS | Status: AC | PRN
  Administered 2020-09-07: 75 mL via INTRAVENOUS

## 2020-09-07 MED ORDER — ACETAMINOPHEN 325 MG PO TABS
650.0000 mg | ORAL_TABLET | Freq: Four times a day (QID) | ORAL | Status: DC | PRN
  Administered 2020-09-07 – 2020-09-08 (×2): 650 mg via ORAL
  Filled 2020-09-07 (×2): qty 2

## 2020-09-07 MED ORDER — APIXABAN 5 MG PO TABS
10.0000 mg | ORAL_TABLET | Freq: Two times a day (BID) | ORAL | Status: DC
  Administered 2020-09-07 – 2020-09-08 (×3): 10 mg via ORAL
  Filled 2020-09-07: qty 4
  Filled 2020-09-07: qty 2
  Filled 2020-09-07: qty 4

## 2020-09-07 MED ORDER — ROSUVASTATIN CALCIUM 10 MG PO TABS
10.0000 mg | ORAL_TABLET | Freq: Every day | ORAL | Status: DC
  Administered 2020-09-07 – 2020-09-08 (×2): 10 mg via ORAL
  Filled 2020-09-07 (×3): qty 1

## 2020-09-07 MED ORDER — OMEGA-3 FATTY ACIDS 1000 MG PO CAPS: 1200.0000 mg | ORAL_CAPSULE | Freq: Every day | ORAL | Status: DC

## 2020-09-07 MED ORDER — DULOXETINE HCL 30 MG PO CPEP
60.0000 mg | ORAL_CAPSULE | Freq: Every day | ORAL | Status: DC
  Administered 2020-09-07 – 2020-09-08 (×2): 60 mg via ORAL
  Filled 2020-09-07: qty 1
  Filled 2020-09-07: qty 2

## 2020-09-07 MED ORDER — OMEGA-3-ACID ETHYL ESTERS 1 G PO CAPS
1.0000 g | ORAL_CAPSULE | Freq: Two times a day (BID) | ORAL | Status: DC
  Administered 2020-09-07 – 2020-09-08 (×3): 1 g via ORAL
  Filled 2020-09-07 (×5): qty 1

## 2020-09-07 NOTE — ED Notes (Signed)
Attempted to call report - no answer. Will try again in 5 mins

## 2020-09-07 NOTE — ED Notes (Signed)
Pt taken to CT.

## 2020-09-07 NOTE — ED Notes (Signed)
Craig [name] (wife) [phone]

## 2020-09-07 NOTE — ED Notes (Signed)
Advised nurse that patient has assigned bed 

## 2020-09-07 NOTE — ED Triage Notes (Signed)
Pt reports woke up this am and had a headache and some numbness on the the right side. Pt undergoing chemo as well.

## 2020-09-07 NOTE — ED Provider Notes (Signed)
[facility] Emergency Department Provider Note   ____________________________________________   First MD Initiated Contact with Patient 09/07/20 [phone]     (approximate)  I have reviewed the triage vital signs and the nursing notes.   HISTORY  Chief Complaint Headache and Numbness    HPI Craig [name] is a 74 y.o. male with past medical history of hypertension, hyperlipidemia, diabetes, CAD status post CABG, CHF, metastatic pancreatic cancer, and PE on Eliquis who presents to the ED complaining of numbness and weakness.  Patient reports that he was feeling fine when he went to bed last night, then woke up about an hour and a half prior to arrival with numbness and weakness on his right side.  He describes numbness affecting both his right arm and right leg, with no numbness on his left side.  He has also noticed difficulty moving his right leg since waking up this morning, has been afraid to try and walk.  He reports associated headache, but denies any vision changes or speech changes.  He was discharged from the hospital 3 days ago following diagnosis of PE, has been taking Eliquis since then.        Past Medical History:  Diagnosis Date  . Allergic rhinitis   . CHF (congestive heart failure) ([place])   . Chronic cholecystitis   . Coronary artery disease   . DDD (degenerative disc disease), cervical   . Dehydration 06/21/2019  . Diabetes mellitus without complication ([place])   . Dyspnea   . Early cataract   . Erectile dysfunction   . Family history of brain cancer   . Family history of cancer   . GERD (gastroesophageal reflux disease)   . Gouty arthritis   . Headache    occasional migraines  . Hypercalcemia   . Hyperlipemia   . Hypertension   . Palpitations   . Pancreatic cancer metastasized to liver ([place]) 05/2019   Chemo tx's  . Peripheral vascular disease ([place])   . 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 Spinal stenosis of cervical region   . Vitamin D deficiency     Patient Active Problem List   Diagnosis Date Noted  . Pulmonary embolism ([place]) 09/02/2020  . Diabetes mellitus without complication ([place]) 70/26/3785  . Depression 09/02/2020  . Chronic systolic CHF (congestive heart failure) ([place]) 09/02/2020  . Genetic testing 09/01/2020  . Flatulence 08/25/2020  . Constipation 08/25/2020  . RUQ abdominal pain 08/25/2020  . Family history of brain cancer   . Family history of cancer   . Neuropathy 06/18/2020  . Hypokalemia 03/17/2020  . 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 Malnutrition of moderate degree 01/26/2020  . DKA (diabetic ketoacidoses) 01/24/2020  . Fatigue 10/03/2019  . Low-level of literacy 06/29/2019  . Abnormal LFTs 06/29/2019  . AKI (acute kidney injury) ([place]) 06/29/2019  . Diarrhea 06/29/2019  . Orthostatic hypotension 06/29/2019  . Hyperglycemia 06/29/2019  . Anemia due to antineoplastic chemotherapy 06/29/2019  . Dehydration 06/21/2019  . Encounter for antineoplastic chemotherapy 06/21/2019  . Normocytic anemia 06/21/2019  . Port-A-Cath in place 06/07/2019  . Goals of care, counseling/discussion 06/01/2019  . Pancreatic cancer metastasized to liver ([place]) 06/01/2019  . Liver metastasis ([place]) 06/01/2019  . CAD (coronary artery disease) 05/17/2019  . Personal history of tobacco use, presenting hazards to health 05/10/2019  . Peripheral arterial disease ([place]) 05/09/2019  . Prediabetes 05/01/2019  .  LVH (left ventricular hypertrophy) due to hypertensive disease, without heart failure 01/04/2018  . 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 Postoperative anemia   . Coronary artery disease involving native coronary artery of native heart   . Coronary artery disease involving native coronary artery of native heart 09/16/2017  . Chronic shortness of breath 08/11/2017  . Tobacco abuse 08/11/2017  . Spondylolisthesis, lumbar region 03/16/2017   . HLD (hyperlipidemia) 09/10/2015  . 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 Essential (primary) hypertension 09/10/2015  . Degeneration of cervical intervertebral disc 09/10/2015  . Allergic rhinitis 09/10/2015  . GERD (gastroesophageal reflux disease) 06/25/2015  . Cervical stenosis of spinal canal 06/25/2015  . Atherosclerosis of native arteries of extremity with intermittent claudication ([place]) 06/25/2015    Past Surgical History:  Procedure Laterality Date  . BACK SURGERY  2018    fusion with screws  . CHOLECYSTECTOMY N/A 11/13/2018   Procedure: LAPAROSCOPIC CHOLECYSTECTOMY;  Surgeon: Vickie Epley, MD;  Location: [facility] ORS;  Service: General;  Laterality: N/A;  . COLONOSCOPY    . CORONARY ARTERY BYPASS GRAFT N/A 09/30/2017   Procedure: CORONARY ARTERY BYPASS GRAFTING times five using right and left Saphaneous vein harvested endoscopicly  and left internal mammary artery. (CABG),TEE;  Surgeon: Rexene Alberts, MD;  Location: [place];  Service: Open Heart Surgery;  Laterality: N/A;  . LEFT HEART CATH AND CORONARY ANGIOGRAPHY Left 09/06/2017   Procedure: LEFT HEART CATH AND CORONARY ANGIOGRAPHY;  Surgeon: Corey Skains, MD;  Location: [place] [facility];  Service: Cardiovascular;  Laterality: Left;  . percutaneous transluminal balloon angioplasty  01/2010   of left lower extremity  . PORTA CATH INSERTION N/A 06/06/2019   Procedure: PORTA CATH INSERTION;  Surgeon: Katha Cabal, MD;  Location: [place] [facility];  Service: Cardiovascular;  Laterality: N/A;  . TEE WITHOUT CARDIOVERSION N/A 09/30/2017   Procedure: TRANSESOPHAGEAL ECHOCARDIOGRAM (TEE);  Surgeon: Rexene Alberts, MD;  Location: [place];  Service: Open Heart Surgery;  Laterality: N/A;  . VASCULAR SURGERY Left 2010   left exernal iliac and superficial femoral artery PTCA and stenting    Prior to Admission medications   Medication Sig Start Date End Date Taking? Authorizing Provider  acetaminophen (TYLENOL)  325 MG tablet Take 2 tablets (650 mg total) by mouth every 6 (six) hours as needed for fever, headache or moderate pain. 09/04/20  Yes Nita Sells, MD  apixaban (ELIQUIS) 5 MG TABS tablet Take 2 tablets (10 mg total) by mouth 2 (two) times daily for 7 days. 09/04/20 09/11/20 Yes Nita Sells, MD  apixaban (ELIQUIS) 5 MG TABS tablet Take 1 tablet (5 mg total) by mouth 2 (two) times daily. 09/11/20  Yes Nita Sells, MD  aspirin EC 81 MG tablet Take 81 mg by mouth daily.   Yes [provider]  capecitabine (XELODA) 500 MG tablet Take 3 tablets (1,500 mg total) by mouth 2 (two) times daily after a meal. Take for 14 days on, 7 days off. Repeat every 21 days. 09/02/20  Yes Earlie Server, MD  carvedilol (COREG) 6.25 MG tablet Take 6.25 mg by mouth 2 (two) times daily with a meal. 03/01/18  Yes [provider]  diltiazem (CARDIZEM CD) 300 MG 24 hr capsule TAKE 1 CAPSULE BY MOUTH EVERY DAY 07/06/20  Yes Carles Collet M, PA-C  docusate sodium (COLACE) 100 MG capsule Take 1 capsule (100 mg total) by mouth 2 (two) times daily. 08/25/20  Yes Flinchum, Kelby Aline, FNP  dorzolamide-timolol (COSOPT) 22.3-6.8  MG/ML ophthalmic solution INSTILL ONE DROP INTO BOTH EYES TWICE DAILY 11/08/18  Yes [provider]  DULoxetine (CYMBALTA) 30 MG capsule TAKE 2 CAPSULES BY MOUTH EVERY DAY 06/24/20  Yes Earlie Server, MD  ferrous sulfate 325 (65 FE) MG tablet TAKE 1 TABLET BY MOUTH EVERY DAY WITH BREAKFAST 06/13/20  Yes Carles Collet M, PA-C  gabapentin (NEURONTIN) 300 MG capsule Take 300 mg by mouth 3 (three) times daily.   Yes [provider]  insulin glargine (LANTUS SOLOSTAR) 100 UNIT/ML Solostar Pen Inject 20 Units into the skin at bedtime. 01/26/20  Yes Debbe Odea, MD  insulin lispro (HUMALOG) 100 UNIT/ML KwikPen Inject 0.05 mLs (5 Units total) into the skin 3 (three) times daily with meals. 01/26/20  Yes Rizwan, Eunice Blase, MD  latanoprost (XALATAN) 0.005 % ophthalmic solution  Place 1 drop into both eyes at bedtime.  07/15/18  Yes [provider]  lidocaine-prilocaine (EMLA) cream APPLY TO AFFECTED AREA ONCE AS DIRECTED 08/24/19  Yes [provider]  Multiple Vitamin (MULTIVITAMIN WITH MINERALS) TABS tablet Take 1 tablet by mouth daily.    Yes [provider]  OMEGA-3 FATTY ACIDS PO Take 1,200 mg by mouth daily.    Yes [provider]  omeprazole (PRILOSEC) 40 MG capsule TAKE 1 CAPSULE BY MOUTH EVERY DAY 06/10/20  Yes Carles Collet M, PA-C  rosuvastatin (CRESTOR) 10 MG tablet Take 1 tablet by mouth daily. 04/27/19  Yes [provider]  triamcinolone ointment (KENALOG) 0.5 % APPLY 1 APPLICATION TOPICALLY 3 (THREE) TIMES DAILY. 04/28/20  Yes Jacquelin Hawking, NP  ONETOUCH VERIO test strip TEST FASTING SUGAR EVERY MORNING 02/09/20   Trinna Post, PA-C    Allergies Lipitor [atorvastatin], Zetia [ezetimibe], Lisinopril, Protonix [pantoprazole], and Spironolactone  Family History  Problem Relation Age of Onset  . Brain cancer Mother   . Emphysema Father   . Cancer Brother     Social History Social History   Tobacco Use  . Smoking status: Former Smoker    Packs/day: 0.75    Years: 50.00    Pack years: 37.50    Types: Cigarettes    Quit date: 09/27/2017    Years since quitting: 2.9  . Smokeless tobacco: Former Systems developer    Types: Secondary school teacher  . Vaping Use: Never used  Substance Use Topics  . Alcohol use: Not Currently    Comment: stopped drinking before cabg  . Drug use: Not Currently    Types: Marijuana    Comment: stopped smoking before CABG    Review of Systems  Constitutional: No fever/chills Eyes: No visual changes. ENT: No sore throat. Cardiovascular: Denies chest pain. Respiratory: Denies shortness of breath. Gastrointestinal: No abdominal pain.  No nausea, no vomiting.  No diarrhea.  No constipation. Genitourinary: Negative for dysuria. Musculoskeletal: Negative for back pain. Skin:  Negative for rash. Neurological: Positive for headaches, focal weakness, and numbness.  ____________________________________________   PHYSICAL EXAM:  VITAL SIGNS: ED Triage Vitals  Enc Vitals Group     BP 09/07/20 0923 (!) 182/93     Pulse Rate 09/07/20 0923 90     Resp 09/07/20 0923 18     Temp 09/07/20 0923 97.7 F (36.5 C)     Temp Source 09/07/20 0923 Oral     SpO2 09/07/20 0923 100 %     Weight 09/07/20 0916 153 lb (69.4 kg)     Height 09/07/20 0916 5\' 6"  (1.676 m)     Head Circumference --  Peak Flow --      Pain Score 09/07/20 0916 5     Pain Loc --      Pain Edu? --      Excl. in [place]? --     Constitutional: Alert and oriented. Eyes: Conjunctivae are normal. Head: Atraumatic. Nose: No congestion/rhinnorhea. Mouth/Throat: Mucous membranes are moist. Neck: Normal ROM Cardiovascular: Normal rate, regular rhythm. Grossly normal heart sounds. Respiratory: Normal respiratory effort.  No retractions. Lungs CTAB. Gastrointestinal: Soft and nontender. No distention. Genitourinary: deferred Musculoskeletal: No lower extremity tenderness nor edema. Neurologic:  Normal speech and language.  Decrease sensation on right versus left.  5-5 strength in bilateral upper extremities with no pronator drift.  3 out of 5 strength in right lower extremity, 5 out of 5 strength in left lower extremity.  No facial droop noted. Skin:  Skin is warm, dry and intact. No rash noted. Psychiatric: Mood and affect are normal. Speech and behavior are normal.  ____________________________________________   LABS (all labs ordered are listed, but only abnormal results are displayed)  Labs Reviewed  PROTIME-INR - Abnormal; Notable for the following components:      Result Value   Prothrombin Time 17.7 (*)    INR 1.5 (*)    All other components within normal limits  CBC WITH DIFFERENTIAL/PLATELET - Abnormal; Notable for the following components:   RBC 2.91 (*)    Hemoglobin 8.5 (*)    HCT  27.1 (*)    RDW 17.8 (*)    Monocytes Absolute 1.2 (*)    All other components within normal limits  COMPREHENSIVE METABOLIC PANEL - Abnormal; Notable for the following components:   Potassium 3.2 (*)    Glucose, Bld 118 (*)    Calcium 8.6 (*)    Total Protein 6.0 (*)    Albumin 2.7 (*)    Alkaline Phosphatase 160 (*)    All other components within normal limits  CBG MONITORING, ED - Abnormal; Notable for the following components:   Glucose-Capillary 109 (*)    All other components within normal limits  RESP PANEL BY RT-PCR (FLU A&B, COVID) ARPGX2  ETHANOL  APTT  URINE DRUG SCREEN, QUALITATIVE ([facility] ONLY)  URINALYSIS, ROUTINE W REFLEX MICROSCOPIC   ____________________________________________  EKG  ED ECG REPORT I, Blake Divine, the attending physician, personally viewed and interpreted this ECG.   Date: 09/07/2020  EKG Time: 10:07  Rate: 88  Rhythm: normal sinus rhythm  Axis: Normal  Intervals:Prolonged QT  ST&T Change: None   PROCEDURES  Procedure(s) performed (including Critical Care):  Procedures   ____________________________________________   INITIAL IMPRESSION / ASSESSMENT AND PLAN / ED COURSE       74 year old male with past medical history of hypertension, hyperlipidemia, diabetes, CAD status post CABG, CHF, metastatic pancreatic cancer, and PE on Eliquis who presents to the ED with right-sided numbness and weakness since waking up this morning.  On initial exam, patient with numbness on his right side along with weakness to his right lower extremity.  Code stroke was called and CT head negative for acute process.  He is outside the window for treatment with TPA, TPA also contraindicated given he takes Eliquis.  CTA was performed as patient VAN positive with small visual field deficit, however he is unlikely to be a candidate for endovascular intervention given poor prognosis related to his metastatic pancreatic cancer.  Per Dr. Doy Mince from neurology,  patient not a candidate for endovascular intervention.  Lab work has been unremarkable and case discussed  with hospitalist for admission for further stroke work-up.     ____________________________________________   FINAL CLINICAL IMPRESSION(S) / ED DIAGNOSES  Final diagnoses:  Cerebrovascular accident (CVA), unspecified mechanism ([place])  Right leg weakness     ED Discharge Orders    None       Note:  This document was prepared using Dragon voice recognition software and may include unintentional dictation errors.   Blake Divine, MD 09/07/20 1101

## 2020-09-07 NOTE — Consult Note (Addendum)
Requesting Physician: Charna Archer    Chief Complaint: Right sided weakness and numbness  I have been asked by Dr. Charna Archer to see this patient in consultation for code stroke.  HPI: Craig [name] is an 74 y.o. 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  Awakened today noting that his right side was numb and weak, particularly the RLE.  Patient also noted a change in vision.  Patient presented for evaluation.  Initial NIHSS of 4. Patient is somewhat confused about the details.  Spoke with sister who reports that when she called the patient on yesterday he did not answer the phone.  They usually talk daily.  Patient is currently home alone but is married.    Date last known well: Date: 09/06/2020 Time last known well: Time: 23:30? tPA Given: No: On Eliquis  Past Medical History:  Diagnosis Date  . Allergic rhinitis   . CHF (congestive heart failure) ([place])   . Chronic cholecystitis   . Coronary artery disease   . DDD (degenerative disc disease), cervical   . Dehydration 06/21/2019  . Diabetes mellitus without complication ([place])   . Dyspnea   . Early cataract   . Erectile dysfunction   . Family history of brain cancer   . Family history of cancer   . GERD (gastroesophageal reflux disease)   . Gouty arthritis   . Headache    occasional migraines  . Hypercalcemia   . Hyperlipemia   . Hypertension   . Palpitations   . Pancreatic cancer metastasized to liver ([place]) 05/2019   Chemo tx's  . Peripheral vascular disease ([place])   . 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   LIMA to LAD SVG to [place] SVG SEQUENTIALLY to OM1 and OM2 SVG to ACUTE MARGINAL  . Spinal stenosis of cervical region   . Vitamin D deficiency     Past Surgical History:  Procedure Laterality Date  . BACK SURGERY  2018    fusion with screws  . CHOLECYSTECTOMY N/A 11/13/2018   Procedure: LAPAROSCOPIC  CHOLECYSTECTOMY;  Surgeon: Vickie Epley, MD;  Location: [facility];  Service: General;  Laterality: N/A;  . COLONOSCOPY    . CORONARY ARTERY BYPASS GRAFT N/A 09/30/2017   Procedure: CORONARY ARTERY BYPASS GRAFTING times five using right and left Saphaneous vein harvested endoscopicly  and left internal mammary artery. (CABG),TEE;  Surgeon: Rexene Alberts, MD;  Location: [facility];  Service: Open Heart Surgery;  Laterality: N/A;  . LEFT HEART CATH AND CORONARY ANGIOGRAPHY Left 09/06/2017   Procedure: LEFT HEART CATH AND CORONARY ANGIOGRAPHY;  Surgeon: Corey Skains, MD;  Location: [facility];  Service: Cardiovascular;  Laterality: Left;  . percutaneous transluminal balloon angioplasty  01/2010   of left lower extremity  . PORTA CATH INSERTION N/A 06/06/2019   Procedure: PORTA CATH INSERTION;  Surgeon: Katha Cabal, MD;  Location: [facility];  Service: Cardiovascular;  Laterality: N/A;  . TEE WITHOUT CARDIOVERSION N/A 09/30/2017   Procedure: TRANSESOPHAGEAL ECHOCARDIOGRAM (TEE);  Surgeon: Rexene Alberts, MD;  Location: [facility];  Service: Open Heart Surgery;  Laterality: N/A;  . VASCULAR SURGERY Left 2010   left exernal iliac and superficial femoral artery PTCA and stenting    Family History  Problem Relation Age of Onset  . Brain cancer Mother   . Emphysema Father   . Cancer Brother  Social History:  reports that he quit smoking about 2 years ago. His smoking use included cigarettes. He has a 37.50 pack-year smoking history. He has quit using smokeless tobacco.  His smokeless tobacco use included chew. He reports previous alcohol use. He reports previous drug use. Drug: Marijuana.  Allergies:  Allergies  Allergen Reactions  . Lipitor [Atorvastatin] Other (See Comments)    MYALGIA  . Zetia [Ezetimibe] Other (See Comments)    MYALGIA  . Lisinopril Cough  . Protonix [Pantoprazole] Other (See Comments)    headache  . Spironolactone Other (See Comments)     Breast tenderness    Medications: I have reviewed the patient's current medications. Prior to Admission medications   Medication Sig Start Date End Date Taking? Authorizing Provider  acetaminophen (TYLENOL) 325 MG tablet Take 2 tablets (650 mg total) by mouth every 6 (six) hours as needed for fever, headache or moderate pain. 09/04/20   Nita Sells, MD  apixaban (ELIQUIS) 5 MG TABS tablet Take 2 tablets (10 mg total) by mouth 2 (two) times daily for 7 days. 09/04/20 09/11/20  Nita Sells, MD  apixaban (ELIQUIS) 5 MG TABS tablet Take 1 tablet (5 mg total) by mouth 2 (two) times daily. 09/11/20   Nita Sells, MD  aspirin EC 81 MG tablet Take 81 mg by mouth daily.    [provider]  capecitabine (XELODA) 500 MG tablet Take 3 tablets (1,500 mg total) by mouth 2 (two) times daily after a meal. Take for 14 days on, 7 days off. Repeat every 21 days. 09/02/20   Earlie Server, MD  carvedilol (COREG) 6.25 MG tablet Take 6.25 mg by mouth 2 (two) times daily with a meal. 03/01/18   [provider]  diltiazem (CARDIZEM CD) 300 MG 24 hr capsule TAKE 1 CAPSULE BY MOUTH EVERY DAY 07/06/20   Trinna Post, PA-C  docusate sodium (COLACE) 100 MG capsule Take 1 capsule (100 mg total) by mouth 2 (two) times daily. 08/25/20   Flinchum, Kelby Aline, FNP  dorzolamide-timolol (COSOPT) 22.3-6.8 MG/ML ophthalmic solution INSTILL ONE DROP INTO BOTH EYES TWICE DAILY 11/08/18   [provider]  DULoxetine (CYMBALTA) 30 MG capsule TAKE 2 CAPSULES BY MOUTH EVERY DAY 06/24/20   Earlie Server, MD  ferrous sulfate 325 (65 FE) MG tablet TAKE 1 TABLET BY MOUTH EVERY DAY WITH BREAKFAST 06/13/20   Carles Collet M, PA-C  gabapentin (NEURONTIN) 300 MG capsule Take 300 mg by mouth 3 (three) times daily.    [provider]  insulin glargine (LANTUS SOLOSTAR) 100 UNIT/ML Solostar Pen Inject 20 Units into the skin at bedtime. 01/26/20   Debbe Odea, MD  insulin lispro (HUMALOG) 100 UNIT/ML  KwikPen Inject 0.05 mLs (5 Units total) into the skin 3 (three) times daily with meals. Patient not taking: Reported on 09/02/2020 01/26/20   Debbe Odea, MD  latanoprost (XALATAN) 0.005 % ophthalmic solution Place 1 drop into both eyes at bedtime.  07/15/18   [provider]  lidocaine-prilocaine (EMLA) cream APPLY TO AFFECTED AREA ONCE AS DIRECTED 08/24/19   [provider]  Multiple Vitamin (MULTIVITAMIN WITH MINERALS) TABS tablet Take 1 tablet by mouth daily.     [provider]  OMEGA-3 FATTY ACIDS PO Take 1,200 mg by mouth daily.     [provider]  omeprazole (PRILOSEC) 40 MG capsule TAKE 1 CAPSULE BY MOUTH EVERY DAY 06/10/20   Carles Collet M, PA-C  ONETOUCH VERIO test strip TEST FASTING SUGAR EVERY MORNING 02/09/20  Carles Collet M, PA-C  rosuvastatin (CRESTOR) 10 MG tablet Take 1 tablet by mouth daily. 04/27/19   [provider]  triamcinolone ointment (KENALOG) 0.5 % APPLY 1 APPLICATION TOPICALLY 3 (THREE) TIMES DAILY. 04/28/20   Jacquelin Hawking, NP    ROS: History obtained from the patient  General ROS: negative for - chills, fatigue, fever, night sweats, weight gain or weight loss Psychological ROS: memory difficulties Ophthalmic ROS: as noted in HPI ENT ROS: negative for - epistaxis, nasal discharge, oral lesions, sore throat, tinnitus or vertigo Allergy and Immunology ROS: negative for - hives or itchy/watery eyes Hematological and Lymphatic ROS: negative for - bleeding problems, bruising or swollen lymph nodes Endocrine ROS: negative for - galactorrhea, hair pattern changes, polydipsia/polyuria or temperature intolerance Respiratory ROS: negative for - cough, hemoptysis, shortness of breath or wheezing Cardiovascular ROS: negative for - chest pain, dyspnea on exertion, edema or irregular heartbeat Gastrointestinal ROS: negative for - abdominal pain, diarrhea, hematemesis, nausea/vomiting or stool incontinence Genito-Urinary ROS:  negative for - dysuria, hematuria, incontinence or urinary frequency/urgency Musculoskeletal ROS: negative for - joint swelling or muscular weakness Neurological ROS: as noted in HPI Dermatological ROS: negative for rash and skin lesion changes  Physical Examination: Blood pressure (!) 167/77, pulse 85, temperature 97.7 F (36.5 C), temperature source Oral, resp. rate 18, height 5\' 6"  (1.676 m), weight 69.4 kg, SpO2 100 %.  HEENT-  Normocephalic, no lesions, without obvious abnormality.  Normal external eye and conjunctiva.  Normal TM's bilaterally.  Normal auditory canals and external ears. Normal external nose, mucus membranes and septum.  Normal pharynx. Cardiovascular- S1, S2 normal, pulses palpable throughout   Lungs- chest clear, no wheezing, rales, normal symmetric air entry Abdomen- soft, non-tender; bowel sounds normal; no masses,  no organomegaly Extremities- no edema Lymph-no adenopathy palpable Musculoskeletal-no joint tenderness, deformity or swelling Skin-warm and dry, no hyperpigmentation, vitiligo, or suspicious lesions  Neurological Examination   Mental Status: Alert, oriented to name and place.  Reports that it is December.  Speech fluent without evidence of aphasia.  Able to follow 3 step commands without difficulty. Cranial Nerves: II: Isabel.  Pupils equal, round, reactive to light and accommodation III,IV, VI: ptosis not present, extra-ocular motions intact bilaterally V,VII: smile symmetric, facial light touch sensation normal bilaterally VIII: hearing normal bilaterally IX,X: gag reflex present XI: bilateral shoulder shrug XII: midline tongue extension Motor: No drift in the BUE's.  RLE drift.  5/5 in the LLE Sensory: Pinprick and light touch decreased in the RLE Deep Tendon Reflexes: Symmetric throughout Plantars: Right: mute   Left: mute Cerebellar: Normal finger-to-nose testing bilaterally Gait: not tested due to safety concerns    Laboratory Studies:   Basic Metabolic Panel: Recent Labs  Lab 09/02/20 0237  NA 138  K 3.5  CL 104  CO2 22  GLUCOSE 127*  BUN 14  CREATININE 0.91  CALCIUM 9.1  MG 1.8    Liver Function Tests: Recent Labs  Lab 09/02/20 0237  AST 39  ALT 22  ALKPHOS 174*  BILITOT 1.0  PROT 6.6  ALBUMIN 3.0*   No results for input(s): LIPASE, AMYLASE in the last 168 hours. No results for input(s): AMMONIA in the last 168 hours.  CBC: Recent Labs  Lab 09/02/20 0237 09/03/20 0432 09/04/20 0711 09/07/20 1004  WBC 7.5 6.1 5.7 5.7  NEUTROABS 4.9  --   --  3.5  HGB 9.6* 9.0* 8.7* 8.5*  HCT 31.1* 29.1* 28.4* 27.1*  MCV 94.0 93.6 93.1 93.1  PLT  200 221 243 222    Cardiac Enzymes: No results for input(s): CKTOTAL, CKMB, CKMBINDEX, TROPONINI in the last 168 hours.  BNP: Invalid input(s): POCBNP  CBG: Recent Labs  Lab 09/03/20 1159 09/03/20 1704 09/03/20 2059 09/04/20 0743 09/07/20 1001  GLUCAP 131* 108* 114* 87 109*    Microbiology: Results for orders placed or performed during the hospital encounter of 09/02/20  Resp Panel by RT-PCR (Flu A&B, Covid) Nasopharyngeal Swab     Status: None   Collection Time: 09/02/20  2:37 AM   Specimen: Nasopharyngeal Swab; Nasopharyngeal(NP) swabs in vial transport medium  Result Value Ref Range Status   SARS Coronavirus 2 by RT PCR NEGATIVE NEGATIVE Final    Comment: (NOTE) SARS-CoV-2 target nucleic acids are NOT DETECTED.  The SARS-CoV-2 RNA is generally detectable in upper respiratory specimens during the acute phase of infection. The lowest concentration of SARS-CoV-2 viral copies this assay can detect is 138 copies/mL. A negative result does not preclude SARS-Cov-2 infection and should not be used as the sole basis for treatment or other patient management decisions. A negative result may occur with  improper specimen collection/handling, submission of specimen other than nasopharyngeal swab, presence of viral mutation(s) within the areas targeted by  this assay, and inadequate number of viral copies(<138 copies/mL). A negative result must be combined with clinical observations, patient history, and epidemiological information. The expected result is Negative.  Fact Sheet for Patients:  EntrepreneurPulse.com.au  Fact Sheet for Healthcare Providers:  IncredibleEmployment.be  This test is no t yet approved or cleared by the [place] FDA and  has been authorized for detection and/or diagnosis of SARS-CoV-2 by FDA under an Emergency Use Authorization (EUA). This EUA will remain  in effect (meaning this test can be used) for the duration of the COVID-19 declaration under Section 564(b)(1) of the Act, 21 U.S.C.section 360bbb-3(b)(1), unless the authorization is terminated  or revoked sooner.       Influenza A by PCR NEGATIVE NEGATIVE Final   Influenza B by PCR NEGATIVE NEGATIVE Final    Comment: (NOTE) The Xpert Xpress SARS-CoV-2/FLU/RSV plus assay is intended as an aid in the diagnosis of influenza from Nasopharyngeal swab specimens and should not be used as a sole basis for treatment. Nasal washings and aspirates are unacceptable for Xpert Xpress SARS-CoV-2/FLU/RSV testing.  Fact Sheet for Patients: EntrepreneurPulse.com.au  Fact Sheet for Healthcare Providers: IncredibleEmployment.be  This test is not yet approved or cleared by the [place] FDA and has been authorized for detection and/or diagnosis of SARS-CoV-2 by FDA under an Emergency Use Authorization (EUA). This EUA will remain in effect (meaning this test can be used) for the duration of the COVID-19 declaration under Section 564(b)(1) of the Act, 21 U.S.C. section 360bbb-3(b)(1), unless the authorization is terminated or revoked.  Performed at [facility], [address] [place], [place] [address]     Coagulation Studies: No results for input(s): LABPROT, INR in the  last 72 hours.  Urinalysis: No results for input(s): COLORURINE, LABSPEC, PHURINE, GLUCOSEU, HGBUR, BILIRUBINUR, KETONESUR, PROTEINUR, UROBILINOGEN, NITRITE, LEUKOCYTESUR in the last 168 hours.  Invalid input(s): APPERANCEUR  Lipid Panel:    Component Value Date/Time   CHOL 180 04/27/2019 0940   TRIG 149 04/27/2019 0940   HDL 47 04/27/2019 0940   CHOLHDL 3.8 04/27/2019 0940   LDLCALC 103 (H) 04/27/2019 0940    HgbA1C:  Lab Results  Component Value Date   HGBA1C 10.7 (H) 01/24/2020    Urine Drug Screen:  No results  found for: LABOPIA, COCAINSCRNUR, LABBENZ, AMPHETMU, THCU, LABBARB  Alcohol Level: No results for input(s): ETH in the last 168 hours.  Other results: EKG: sinus rhythm at 88 bpm.  Imaging: CT HEAD CODE STROKE WO CONTRAST  Result Date: 09/07/2020 CLINICAL DATA:  Code stroke. Neuro deficit, acute stroke suspected. Right-sided weakness and headache. EXAM: CT HEAD WITHOUT CONTRAST TECHNIQUE: Contiguous axial images were obtained from the base of the skull through the vertex without intravenous contrast. COMPARISON:  None. FINDINGS: Brain: No evidence of acute large vascular territory infarction, hemorrhage, hydrocephalus, extra-axial collection or mass lesion/mass effect. Patchy white matter hypoattenuation, nonspecific but most likely secondary to chronic microvascular ischemic disease. Vascular: Intracranial calcific atherosclerosis. No hyperdense vessel identified. Skull: No acute fracture. Sinuses/Orbits: No acute findings. Other: No mastoid effusions. ASPECTS [facility] Stroke Program Early CT Score) total score (0-10 with 10 being normal): 10. IMPRESSION: No evidence of acute intracranial abnormality.  ASPECTS is 10. Code stroke imaging results were communicated on 09/07/2020 at 9:54 am to provider Dr. Charna Archer via telephone, who verbally acknowledged these results. Electronically Signed   By: Margaretha Sheffield MD   On: 09/07/2020 09:57    Assessment: 74 y.o. 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  Awakened today noting that his right side was numb and weak, particularly the RLE.  Patient also noted a change in vision.  Patient presented for evaluation.  Initial NIHSS of 4.  Initial head CT reviewed and show no acute changes, no evidence of hemorrhage. Patient not a tPA candidate due to use of Eliquis.  Exam not particularly suggestive of a LVO.  CTA of the head and neck reviewed and shows severe left P2/P3 stenosis, 50-60% right common carotid stenosis, severe left common carotid stenosis, severe right vertebral stenosis.  Per patient with a life expectancy of less than 6 months from pancreatic cancer.  Not a candidate for thrombectomy.  Further work up recommended.    Stroke Risk Factors - diabetes mellitus, hyperlipidemia and hypertension  Plan: 1. HgbA1c, fasting lipid panel 2. MRI of the brain without contrast 3. PT consult, OT consult, Speech consult 4. Echocardiogram 5. Prophylactic therapy-Continue Eliquis 6. NPO until RN stroke swallow screen 7. Telemetry monitoring 8. Frequent neuro checks 9. Oncology to be made aware of patient's admission.  Scheduled for palliative chemotherapy tomorrow.    Alexis Goodell, MD Neurology  09/07/2020, 10:23 AM

## 2020-09-07 NOTE — ED Notes (Signed)
EDP at bedside  

## 2020-09-07 NOTE — ED Notes (Signed)
Dr Marcha Solders at bedside in [place]. IV placed for CTA.

## 2020-09-07 NOTE — ED Notes (Signed)
Code stroke called at 10:08 am

## 2020-09-07 NOTE — H&P (Signed)
History and Physical    Craig [name] HTD:[id number] DOB: December 20, 1945 DOA: 09/07/2020  PCP: Trinna Post, PA-C   Patient coming from: Home  I have personally briefly reviewed patient's old medical records in [place]  Chief Complaint: Right-sided weakness and numbness  Most of the history is obtained from patient's sister at the bedside  HPI: Craig [name] is a 74 y.o. 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 Patient was discharged from the hospital on 11/25 and according to his sister was in his usual state of health until this morning when he called her and asked that she bring him to the hospital because he was weak on his right side.  Patient's last known well was last night when he went to bed and he woke up this morning with symptoms of weakness involving his right lower extremity numbness involving his right upper and lower extremity.  He also had a headache which has resolved but denied having any vision changes or slurred speech.  He was able to ambulate to the car but with assistance and she states that he was dragging his right leg.  He denies having any difficulty swallowing, no chest pain, no nausea, no vomiting, no dizziness, no lightheadedness, no abdominal pain or any changes in his bowel habits, no fever, no cough no or shortness of breath.  A code stroke was called upon patient's arrival to the emergency room. Labs show sodium 136, potassium 3.2, chloride 103, bicarb 22, glucose 118, BUN 10, creatinine 0.69, calcium 8.6, alkaline phosphatase 160, albumin 2.7, AST 38, ALT 19, white count 5.7, hemoglobin 8.5, hematocrit 27.1, RDW 17.8, platelet count 222, PT  17.7, INR 1.5 Initial CT scan of the head without contrast is negative for bleed and shows no acute intracranial abnormality. CT angiogram of the head and neck showed severe stenosis of the left PCA at the P2/P3 junction with poor opacification of more distal PCA branches.  Otherwise no evidence of large vessel occlusion or hemodynamically significant stenosis intracranially.  Extensive atherosclerosis in the neck, including severe stenosis of the left common carotid origin and approximately 50 to 60% stenosis of the right common carotid artery.  Approximately 50% stenosis of the proximal left cervical ICA.  Severe stenosis of the nondominant right vertebral artery origin with poor opacification of the proximal right vertebral artery and nonopacification distally. Twelve-lead EKG reviewed by me shows sinus rhythm with an incomplete left bundle branch block   ED Course: Patient is a 74 year old African-American male with a history of stage IV pancreatic cancer, on anticoagulation therapy for recently diagnosed pulmonary embolism who was brought in by his sister for evaluation of right-sided weakness and numbness concerning for an acute stroke.  Initial CT scan of the brain was negative for bleed and CT angiogram does not show any large vessel occlusion but shows severe atherosclerosis in the neck.  Patient will be admitted to the hospital for further evaluation.  Review of Systems: As per HPI otherwise 10 point review of systems negative.    Past Medical History:  Diagnosis Date  . Allergic rhinitis   . CHF (congestive heart failure) ([place])   . Chronic cholecystitis   . Coronary artery disease   . DDD (degenerative disc disease), cervical   .  Dehydration 06/21/2019  . Diabetes mellitus without complication ([place])   . Dyspnea   . Early cataract   . Erectile dysfunction   . Family history of brain cancer   . Family history of cancer   . GERD (gastroesophageal reflux disease)   . Gouty arthritis     . Headache    occasional migraines  . Hypercalcemia   . Hyperlipemia   . Hypertension   . Palpitations   . Pancreatic cancer metastasized to liver ([place]) 05/2019   Chemo tx's  . Peripheral vascular disease ([place])   . 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 Spinal stenosis of cervical region   . Vitamin D deficiency     Past Surgical History:  Procedure Laterality Date  . BACK SURGERY  2018    fusion with screws  . CHOLECYSTECTOMY N/A 11/13/2018   Procedure: LAPAROSCOPIC CHOLECYSTECTOMY;  Surgeon: Vickie Epley, MD;  Location: [facility]S;  Service: General;  Laterality: N/A;  . COLONOSCOPY    . CORONARY ARTERY BYPASS GRAFT N/A 09/30/2017   Procedure: CORONARY ARTERY BYPASS GRAFTING times five using right and left Saphaneous vein harvested endoscopicly  and left internal mammary artery. (CABG),TEE;  Surgeon: Rexene Alberts, MD;  Location: [place];  Service: Open Heart Surgery;  Laterality: N/A;  . LEFT HEART CATH AND CORONARY ANGIOGRAPHY Left 09/06/2017   Procedure: LEFT HEART CATH AND CORONARY ANGIOGRAPHY;  Surgeon: Corey Skains, MD;  Location: [place] [facility];  Service: Cardiovascular;  Laterality: Left;  . percutaneous transluminal balloon angioplasty  01/2010   of left lower extremity  . PORTA CATH INSERTION N/A 06/06/2019   Procedure: PORTA CATH INSERTION;  Surgeon: Katha Cabal, MD;  Location: [place] [facility];  Service: Cardiovascular;  Laterality: N/A;  . TEE WITHOUT CARDIOVERSION N/A 09/30/2017   Procedure: TRANSESOPHAGEAL ECHOCARDIOGRAM (TEE);  Surgeon: Rexene Alberts, MD;  Location: [facility];  Service: Open Heart Surgery;  Laterality: N/A;  . VASCULAR SURGERY Left 2010   left exernal iliac and superficial femoral artery PTCA and stenting     reports that he quit smoking about 2 years ago. His smoking use included cigarettes. He has a 37.50 pack-year smoking history. He has  quit using smokeless tobacco.  His smokeless tobacco use included chew. He reports previous alcohol use. He reports previous drug use. Drug: Marijuana.  Allergies  Allergen Reactions  . Lipitor [Atorvastatin] Other (See Comments)    MYALGIA  . Zetia [Ezetimibe] Other (See Comments)    MYALGIA  . Lisinopril Cough  . Protonix [Pantoprazole] Other (See Comments)    headache  . Spironolactone Other (See Comments)    Breast tenderness    Family History  Problem Relation Age of Onset  . Brain cancer Mother   . Emphysema Father   . Cancer Brother      Prior to Admission medications   Medication Sig Start Date End Date Taking? Authorizing Provider  acetaminophen (TYLENOL) 325 MG tablet Take 2 tablets (650 mg total) by mouth every 6 (six) hours as needed for fever, headache or moderate pain. 09/04/20  Yes Nita Sells, MD  apixaban (ELIQUIS) 5 MG TABS tablet Take 2 tablets (10 mg total) by mouth 2 (two) times daily for 7 days. 09/04/20 09/11/20 Yes Nita Sells, MD  apixaban (ELIQUIS) 5 MG TABS tablet Take 1 tablet (5 mg total) by mouth 2 (two) times daily.  09/11/20  Yes Nita Sells, MD  aspirin EC 81 MG tablet Take 81 mg by mouth daily.   Yes [provider]  capecitabine (XELODA) 500 MG tablet Take 3 tablets (1,500 mg total) by mouth 2 (two) times daily after a meal. Take for 14 days on, 7 days off. Repeat every 21 days. 09/02/20  Yes Earlie Server, MD  carvedilol (COREG) 6.25 MG tablet Take 6.25 mg by mouth 2 (two) times daily with a meal. 03/01/18  Yes [provider]  diltiazem (CARDIZEM CD) 300 MG 24 hr capsule TAKE 1 CAPSULE BY MOUTH EVERY DAY 07/06/20  Yes Carles Collet M, PA-C  docusate sodium (COLACE) 100 MG capsule Take 1 capsule (100 mg total) by mouth 2 (two) times daily. 08/25/20  Yes Flinchum, Kelby Aline, FNP  dorzolamide-timolol (COSOPT) 22.3-6.8 MG/ML ophthalmic solution INSTILL ONE DROP INTO BOTH EYES TWICE DAILY 11/08/18  Yes [provider]  DULoxetine (CYMBALTA) 30 MG capsule TAKE 2 CAPSULES BY MOUTH EVERY DAY 06/24/20  Yes Earlie Server, MD  ferrous sulfate 325 (65 FE) MG tablet TAKE 1 TABLET BY MOUTH EVERY DAY WITH BREAKFAST 06/13/20  Yes Carles Collet M, PA-C  gabapentin (NEURONTIN) 300 MG capsule Take 300 mg by mouth 3 (three) times daily.   Yes [provider]  insulin glargine (LANTUS SOLOSTAR) 100 UNIT/ML Solostar Pen Inject 20 Units into the skin at bedtime. 01/26/20  Yes Debbe Odea, MD  insulin lispro (HUMALOG) 100 UNIT/ML KwikPen Inject 0.05 mLs (5 Units total) into the skin 3 (three) times daily with meals. 01/26/20  Yes Rizwan, Eunice Blase, MD  latanoprost (XALATAN) 0.005 % ophthalmic solution Place 1 drop into both eyes at bedtime.  07/15/18  Yes [provider]  lidocaine-prilocaine (EMLA) cream APPLY TO AFFECTED AREA ONCE AS DIRECTED 08/24/19  Yes [provider]  Multiple Vitamin (MULTIVITAMIN WITH MINERALS) TABS tablet Take 1 tablet by mouth daily.    Yes [provider]  OMEGA-3 FATTY ACIDS PO Take 1,200 mg by mouth daily.    Yes [provider]  omeprazole (PRILOSEC) 40 MG capsule TAKE 1 CAPSULE BY MOUTH EVERY DAY 06/10/20  Yes Carles Collet M, PA-C  rosuvastatin (CRESTOR) 10 MG tablet Take 1 tablet by mouth daily. 04/27/19  Yes [provider]  triamcinolone ointment (KENALOG) 0.5 % APPLY 1 APPLICATION TOPICALLY 3 (THREE) TIMES DAILY. 04/28/20  Yes Jacquelin Hawking, NP  [facility] VERIO test strip TEST FASTING SUGAR EVERY MORNING 02/09/20   Trinna Post, [place]    Physical Exam: Vitals:   09/07/20 9390 09/07/20 1004 09/07/20 1115 09/07/20 1130  BP: (!) 182/93 (!) 167/77 (!) 178/86 (!) 175/88  Pulse: 90 85 81 84  Resp: 18 18 18  (!) 23  Temp: 97.7 F (36.5 C)     TempSrc: Oral     SpO2: 100% 100% 100% 100%  Weight:      Height:         Vitals:   09/07/20 0923 09/07/20 1004 09/07/20 1115 09/07/20 1130  BP: (!) 182/93 (!) 167/77 (!) 178/86 (!)  175/88  Pulse: 90 85 81 84  Resp: 18 18 18  (!) 23  Temp: 97.7 F (36.5 C)     TempSrc: Oral     SpO2: 100% 100% 100% 100%  Weight:      Height:        Constitutional: NAD, alert and oriented x 2.  Person and place Eyes: PERRL, lids and conjunctivae pallor and tinge of icterus  ENMT: Mucous membranes are  moist.  Neck: normal, supple, no masses, no thyromegaly Respiratory: clear to auscultation bilaterally, no wheezing, no crackles. Normal respiratory effort. No accessory muscle use.  Cardiovascular: Regular rate and rhythm, no murmurs / rubs / gallops. No extremity edema. 2+ pedal pulses. No carotid bruits.  Abdomen: no tenderness, no masses palpated. No hepatosplenomegaly. Bowel sounds positive.  Musculoskeletal: no clubbing / cyanosis. No joint deformity upper and lower extremities.  Skin: no rashes, lesions, ulcers.  Neurologic: Able to move all extremities, right lower extremity weakness 3- 4/5 Psychiatric: Normal mood and affect.   Labs on Admission: I have personally reviewed following labs and imaging studies  CBC: Recent Labs  Lab 09/02/20 0237 09/03/20 0432 09/04/20 0711 09/07/20 1004  WBC 7.5 6.1 5.7 5.7  NEUTROABS 4.9  --   --  3.5  HGB 9.6* 9.0* 8.7* 8.5*  HCT 31.1* 29.1* 28.4* 27.1*  MCV 94.0 93.6 93.1 93.1  PLT 200 221 243 604   Basic Metabolic Panel: Recent Labs  Lab 09/02/20 0237 09/07/20 1004  NA 138 136  K 3.5 3.2*  CL 104 103  CO2 22 22  GLUCOSE 127* 118*  BUN 14 10  CREATININE 0.91 0.69  CALCIUM 9.1 8.6*  MG 1.8  --    GFR: Estimated Creatinine Clearance: 74.2 mL/min (by C-G formula based on SCr of 0.69 mg/dL). Liver Function Tests: Recent Labs  Lab 09/02/20 0237 09/07/20 1004  AST 39 38  ALT 22 19  ALKPHOS 174* 160*  BILITOT 1.0 0.9  PROT 6.6 6.0*  ALBUMIN 3.0* 2.7*   No results for input(s): LIPASE, AMYLASE in the last 168 hours. No results for input(s): AMMONIA in the last 168 hours. Coagulation Profile: Recent Labs  Lab  09/02/20 0237 09/07/20 1004  INR 1.1 1.5*   Cardiac Enzymes: No results for input(s): CKTOTAL, CKMB, CKMBINDEX, TROPONINI in the last 168 hours. BNP (last 3 results) No results for input(s): PROBNP in the last 8760 hours. HbA1C: No results for input(s): HGBA1C in the last 72 hours. CBG: Recent Labs  Lab 09/03/20 1159 09/03/20 1704 09/03/20 2059 09/04/20 0743 09/07/20 1001  GLUCAP 131* 108* 114* 87 109*   Lipid Profile: No results for input(s): CHOL, HDL, LDLCALC, TRIG, CHOLHDL, LDLDIRECT in the last 72 hours. Thyroid Function Tests: No results for input(s): TSH, T4TOTAL, FREET4, T3FREE, THYROIDAB in the last 72 hours. Anemia Panel: No results for input(s): VITAMINB12, FOLATE, FERRITIN, TIBC, IRON, RETICCTPCT in the last 72 hours. Urine analysis:    Component Value Date/Time   COLORURINE STRAW (A) 01/20/2020 1814   APPEARANCEUR CLEAR (A) 01/20/2020 1814   LABSPEC 1.032 (H) 01/20/2020 1814   PHURINE 6.0 01/20/2020 1814   GLUCOSEU >=500 (A) 01/20/2020 1814   HGBUR NEGATIVE 01/20/2020 1814   BILIRUBINUR neg 01/24/2020 1223   KETONESUR NEGATIVE 01/20/2020 1814   PROTEINUR Negative 01/24/2020 1223   PROTEINUR NEGATIVE 01/20/2020 1814   UROBILINOGEN 0.2 01/24/2020 1223   NITRITE neg 01/24/2020 1223   NITRITE NEGATIVE 01/20/2020 1814   LEUKOCYTESUR Negative 01/24/2020 1223   LEUKOCYTESUR NEGATIVE 01/20/2020 1814    Radiological Exams on Admission: CT Angio Head W or Wo Contrast  Result Date: 09/07/2020 CLINICAL DATA:  Headache with some numbness on the right side. EXAM: CT ANGIOGRAPHY HEAD AND NECK TECHNIQUE: Multidetector CT imaging of the head and neck was performed using the standard protocol during bolus administration of intravenous contrast. Multiplanar CT image reconstructions and MIPs were obtained to evaluate the vascular anatomy. Carotid stenosis measurements (when applicable) are obtained utilizing  NASCET criteria, using the distal internal carotid diameter as  the denominator. CONTRAST:  62mL OMNIPAQUE IOHEXOL 350 MG/ML SOLN COMPARISON:  Same day head CT. FINDINGS: CTA NECK FINDINGS Aortic arch: Extensive calcific and noncalcific atherosclerosis of the partially imaged aorta. Great vessel origins are patent. Right carotid system: Approximately 50-60% stenosis of the right common carotid artery origin. Predominantly calcific atherosclerosis at the carotid bifurcation without greater than 50% narrowing. Left carotid system: The left common carotid artery arises directly from the aorta with severe stenosis secondary atherosclerosis. Mixed calcific and noncalcific atherosclerosis at the carotid bifurcation with approximately 50% stenosis Vertebral arteries: Left dominant. Severe stenosis of the right vertebral artery origin. The right vertebral artery is diminutive and poorly opacified proximally and becomes non-opacified at approximately C2-C3. Moderate atherosclerotic narrowing of the proximal left vertebral artery. Skeleton: Severe degenerative disc disease at C4-C5 C5-C6. No evidence of acute fracture. Other neck: No mass or suspicious adenopathy. Upper chest: Partially imaged opacities in the right upper lobe, similar to prior CT chest. Review of the MIP images confirms the above findings CTA HEAD FINDINGS Anterior circulation: Bilateral petrous and cavernous internal carotid arteries are patent. Bilateral cavernous carotid calcific atherosclerosis without evidence of hemodynamically significant stenosis. Bilateral MCAs are patent without evidence hemodynamically significant proximal stenosis or large vessel occlusion. Right dominant A1 ACA with hypoplastic or absent left A1 ACA. No evidence of hemodynamically significant stenosis or large vessel occlusion involving the ACAs. No aneurysm. Posterior circulation: Non-opacified right intradural vertebral artery. The left intradural vertebral artery and basilar artery are patent. Bilateral PCAs are opacified proximally.  Suspected severe stenosis of the left PCA at the P2/P3 junction with poor opacification of more distal PCA branches. Venous sinuses: As permitted by contrast timing, patent. Small left transverse and sigmoid sinus. Review of the MIP images confirms the above findings IMPRESSION: 1. Intracranially, there is suspected severe stenosis of the left PCA at the P2/P3 junction with poor opacification of more distal PCA branches. Otherwise, no evidence of large vessel occlusion or hemodynamically significant stenosis intracranially. 2.  Extensive atherosclerosis in the neck, including: - Severe stenosis of the left common carotid origin and approximately 50-60% stenosis of the right common carotid artery origin. - Approximately 50% stenosis of the proximal left cervical ICA. - Severe stenosis of the non dominant right vertebral artery origin with poor opacification of the proximal right vertebral artery and non opacification distally. - Moderate narrowing of the dominant left vertebral artery proximally. 3. Partially imaged bilateral pulmonary emboli and right upper lobe opacities, which were better characterized on recent CTA of the chest from August 29, 2020. Findings were communicated on 09/07/2020 at 10:33 am to provider Dr. Charna Archer Via telephone, who verbally acknowledged these results. Electronically Signed   By: Margaretha Sheffield MD   On: 09/07/2020 10:36   CT Angio Neck W and/or Wo Contrast  Result Date: 09/07/2020 CLINICAL DATA:  Headache with some numbness on the right side. EXAM: CT ANGIOGRAPHY HEAD AND NECK TECHNIQUE: Multidetector CT imaging of the head and neck was performed using the standard protocol during bolus administration of intravenous contrast. Multiplanar CT image reconstructions and MIPs were obtained to evaluate the vascular anatomy. Carotid stenosis measurements (when applicable) are obtained utilizing NASCET criteria, using the distal internal carotid diameter as the denominator. CONTRAST:   41mL OMNIPAQUE IOHEXOL 350 MG/ML SOLN COMPARISON:  Same day head CT. FINDINGS: CTA NECK FINDINGS Aortic arch: Extensive calcific and noncalcific atherosclerosis of the partially imaged aorta. Great vessel origins are patent. Right  carotid system: Approximately 50-60% stenosis of the right common carotid artery origin. Predominantly calcific atherosclerosis at the carotid bifurcation without greater than 50% narrowing. Left carotid system: The left common carotid artery arises directly from the aorta with severe stenosis secondary atherosclerosis. Mixed calcific and noncalcific atherosclerosis at the carotid bifurcation with approximately 50% stenosis Vertebral arteries: Left dominant. Severe stenosis of the right vertebral artery origin. The right vertebral artery is diminutive and poorly opacified proximally and becomes non-opacified at approximately C2-C3. Moderate atherosclerotic narrowing of the proximal left vertebral artery. Skeleton: Severe degenerative disc disease at C4-C5 C5-C6. No evidence of acute fracture. Other neck: No mass or suspicious adenopathy. Upper chest: Partially imaged opacities in the right upper lobe, similar to prior CT chest. Review of the MIP images confirms the above findings CTA HEAD FINDINGS Anterior circulation: Bilateral petrous and cavernous internal carotid arteries are patent. Bilateral cavernous carotid calcific atherosclerosis without evidence of hemodynamically significant stenosis. Bilateral MCAs are patent without evidence hemodynamically significant proximal stenosis or large vessel occlusion. Right dominant A1 ACA with hypoplastic or absent left A1 ACA. No evidence of hemodynamically significant stenosis or large vessel occlusion involving the ACAs. No aneurysm. Posterior circulation: Non-opacified right intradural vertebral artery. The left intradural vertebral artery and basilar artery are patent. Bilateral PCAs are opacified proximally. Suspected severe stenosis of the  left PCA at the P2/P3 junction with poor opacification of more distal PCA branches. Venous sinuses: As permitted by contrast timing, patent. Small left transverse and sigmoid sinus. Review of the MIP images confirms the above findings IMPRESSION: 1. Intracranially, there is suspected severe stenosis of the left PCA at the P2/P3 junction with poor opacification of more distal PCA branches. Otherwise, no evidence of large vessel occlusion or hemodynamically significant stenosis intracranially. 2.  Extensive atherosclerosis in the neck, including: - Severe stenosis of the left common carotid origin and approximately 50-60% stenosis of the right common carotid artery origin. - Approximately 50% stenosis of the proximal left cervical ICA. - Severe stenosis of the non dominant right vertebral artery origin with poor opacification of the proximal right vertebral artery and non opacification distally. - Moderate narrowing of the dominant left vertebral artery proximally. 3. Partially imaged bilateral pulmonary emboli and right upper lobe opacities, which were better characterized on recent CTA of the chest from August 29, 2020. Findings were communicated on 09/07/2020 at 10:33 am to provider Dr. Charna Archer Via telephone, who verbally acknowledged these results. Electronically Signed   By: Margaretha Sheffield MD   On: 09/07/2020 10:36   CT HEAD CODE STROKE WO CONTRAST  Result Date: 09/07/2020 CLINICAL DATA:  Code stroke. Neuro deficit, acute stroke suspected. Right-sided weakness and headache. EXAM: CT HEAD WITHOUT CONTRAST TECHNIQUE: Contiguous axial images were obtained from the base of the skull through the vertex without intravenous contrast. COMPARISON:  None. FINDINGS: Brain: No evidence of acute large vascular territory infarction, hemorrhage, hydrocephalus, extra-axial collection or mass lesion/mass effect. Patchy white matter hypoattenuation, nonspecific but most likely secondary to chronic microvascular ischemic  disease. Vascular: Intracranial calcific atherosclerosis. No hyperdense vessel identified. Skull: No acute fracture. Sinuses/Orbits: No acute findings. Other: No mastoid effusions. ASPECTS [facility] Stroke Program Early CT Score) total score (0-10 with 10 being normal): 10. IMPRESSION: No evidence of acute intracranial abnormality.  ASPECTS is 10. Code stroke imaging results were communicated on 09/07/2020 at 9:54 am to provider Dr. Charna Archer via telephone, who verbally acknowledged these results. Electronically Signed   By: Margaretha Sheffield MD   On: 09/07/2020 09:57  EKG: Independently reviewed.  Sinus rhythm Incomplete left bundle branch block  Assessment/Plan Principal Problem:   Acute CVA (cerebrovascular accident) [facility]) Active Problems:   Essential (primary) hypertension   Pancreatic cancer metastasized to liver [facility])   Pulmonary embolism (HCC)   Diabetes mellitus without complication ([place])     Rule out acute CVA Patient presents for evaluation of sided weakness and numbness concerning for an acute stroke Initial CT scan of the head without contrast is negative for bleed and CTA is negative for large vessel occlusion but shows severe atherosclerosis in the neck We will obtain MRI of the brain to rule out an acute stroke Patient had a 2D echocardiogram done recently which shows a normal LVEF Continue aspirin and high intensity statin We will allow for permissive hypertension Consult PT/OT/ST We will request neurology consult    Diabetes mellitus Hold long-acting insulin for now Sliding scale coverage with Accu-Cheks every 4 hours   Essential hypertension We will allow for permissive hypertension until an acute stroke is ruled out We will treat patient with labetalol for systolic blood pressure greater than 267mmHg   Stage IV pancreatic cancer Patient has metastatic disease to the liver and is on palliative chemotherapy Follow-up with oncology as an outpatient   History  of pulmonary embolism Continue Eliquis 10 mg p.o. twice daily and switch to 5 mg twice daily after 7 days   DVT prophylaxis: Eliquis Code Status: DO NOT RESUSCITATE Family Communication: Greater than 50% of time was spent discussing patient's condition and plan of care with his sister who was at the bedside, Ms Bryson Corona.  All questions and concerns have been addressed.  She verbalizes understanding and agrees with the plan.  CODE STATUS was discussed and patient is a DO NOT RESUSCITATE Disposition Plan: Back to previous home environment Consults called: Oncology/Neurology    Collier Bullock MD [facility]     09/07/2020, 11:48 AM

## 2020-09-07 NOTE — Progress Notes (Signed)
   09/07/20 1015  Clinical Encounter Type  Visited With Patient and family together  Visit Type Initial  Referral From Nurse  Consult/Referral To Chaplain  Chaplain responded to a CODE STROKE, but when she arrived the nurse said everything was ok and family was with the PT. Chaplain went in and introduced herself. Pt's sister was at sitting in the room with him. Chaplain briefly spoke with them and asked if she could pray with them and he said yes. Pt's sister grabbed chaplain's hand and chaplain prayed. After praying chaplain told them if they need her to have her paged.

## 2020-09-07 NOTE — ED Notes (Signed)
Pt not a TPA candidate

## 2020-09-08 ENCOUNTER — Inpatient Hospital Stay: Payer: Medicare Other

## 2020-09-08 ENCOUNTER — Inpatient Hospital Stay: Payer: Medicare Other | Admitting: Hospice and Palliative Medicine

## 2020-09-08 ENCOUNTER — Inpatient Hospital Stay: Payer: Medicare Other | Admitting: Oncology

## 2020-09-08 DIAGNOSIS — C787 Secondary malignant neoplasm of liver and intrahepatic bile duct: Secondary | ICD-10-CM | POA: Diagnosis not present

## 2020-09-08 DIAGNOSIS — I639 Cerebral infarction, unspecified: Secondary | ICD-10-CM

## 2020-09-08 DIAGNOSIS — I2699 Other pulmonary embolism without acute cor pulmonale: Secondary | ICD-10-CM | POA: Diagnosis not present

## 2020-09-08 DIAGNOSIS — C259 Malignant neoplasm of pancreas, unspecified: Secondary | ICD-10-CM | POA: Diagnosis not present

## 2020-09-08 LAB — HEMOGLOBIN A1C
Hgb A1c MFr Bld: 5.5 % (ref 4.8–5.6)
Mean Plasma Glucose: 111.15 mg/dL

## 2020-09-08 LAB — LIPID PANEL
Cholesterol: 144 mg/dL (ref 0–200)
HDL: 35 mg/dL — ABNORMAL LOW (ref >40)
LDL Cholesterol: 73 mg/dL (ref 0–99)
Total CHOL/HDL Ratio: 4.1 RATIO
Triglycerides: 178 mg/dL — ABNORMAL HIGH (ref <150)
VLDL: 36 mg/dL (ref 0–40)

## 2020-09-08 LAB — GLUCOSE, CAPILLARY
Glucose-Capillary: 85 mg/dL (ref 70–99)
Glucose-Capillary: 125 mg/dL — ABNORMAL HIGH (ref 70–99)
Glucose-Capillary: 106 mg/dL — ABNORMAL HIGH (ref 70–99)
Glucose-Capillary: 155 mg/dL — ABNORMAL HIGH (ref 70–99)
Glucose-Capillary: 94 mg/dL (ref 70–99)

## 2020-09-08 MED ORDER — POTASSIUM CHLORIDE CRYS ER 20 MEQ PO TBCR
40.0000 meq | EXTENDED_RELEASE_TABLET | Freq: Once | ORAL | Status: AC
  Administered 2020-09-08: 09:00:00 40 meq via ORAL
  Filled 2020-09-08: qty 2

## 2020-09-08 NOTE — Progress Notes (Signed)
This RN provided discharge instructions and teaching to the pt. The pt verbalized and demonstrated understanding of the provided instructions. All outstanding questions resolved. L Arm PIV removed. Cannula intact. Pt tolerated well. Pt's sister called and on the way to discharge patient. Transport to discharge via wheelchair to private vehicle.

## 2020-09-08 NOTE — Progress Notes (Signed)
SLP Cancellation Note  Patient Details Name: Craig [name] MRN: [id number] DOB: Feb 25, 1946   Cancelled treatment:       Reason Eval/Treat Not Completed: SLP screened, no needs identified, will sign off (chart reviewed; consulted NSG then met w/ pt).  Pt sitting in chair upon entering room watching TV. Breakfast tray empty in front of him. He denied any difficulty swallowing and is currently on a regular diet; tolerates swallowing pills w/ water per NSG.  Pt conversed in conversation w/out gross deficits noted; pt denied any speech-language deficits. 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 He followed basic instructions consistently. Pt recalled 2 items from his breakfast meal. PT/OT following for R sided weakness, possible inattention to R side. Speech clear though he mumbled slightly during conversation. He seemed thoughtful about his Cancer issues. Recommend f/u w/ Chaplain if desired.  No further skilled ST services indicated during Acute status/admit as pt appears close to/at his baseline; he is denying any speech/language issues currently. Recommend f/u post D/C IF any new changes noted from his Baseline. Pt agreed. MD/SW updated; will reconsult if any change in status while admitted.     Orinda Kenner, MS, CCC-SLP Speech Language Pathologist Rehab [facility] [phone] [facility] 09/08/2020, 1:10 PM

## 2020-09-08 NOTE — Evaluation (Signed)
Physical Therapy Evaluation Patient Details Name: Craig [name] MRN: [id number] DOB: 1946-08-28 Today's Date: 09/08/2020   History of Present Illness  Pt is a 74 y.o. 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  Initial CT scan of the brain was negative for bleed and CT angiogram does not show any large vessel occlusion but shows severe atherosclerosis in the neck. Pt recently discharged from hospital 11/25 with diagnosis of PE. MRI+ acute CVA affecting L occipital, L hippocampus, L thalamus, andother in L frontal, caudate, and cerebellum.    Clinical Impression  Cognition:The pt is alert and oriented x4, pleasant, conversational, and following simple and multi-step commands consistently. May have some impaired short term memory Strength: MMT revealed decreased R shoulder strength, as well as decreased R hip and knee strength Joint Proprioception: deferred  Light Touch Sensation: decreased light touch sensation of RLE Dysmetria: Finger to nose accuracy decreased on RUE, extended time needed to improve accuracy Digital opposition in sequence: equal bilat Visual Perception: potential R side inattention Eye Tracking: smooth pursuits intact left/right at midline, pt unable to track to R Balance: mildly impaired, able to ambulate without AD but impaired RLE coordination noted Speech: WNL    The patient demonstrated bed mobility with supervision. Good sitting balance noted. Sit <> Stand with CGA and no AD, pt did need extra time for initial standing balance and prior to ambulation, but able initiate and terminate ambulation without assistance. ~38ft, some gait path deviation noted and did  exhibit difficulty scanning environment to ensure safety due to decreased vision.  Overall the patient demonstrated deficits (see "PT Problem List") that impede the patient's functional abilities, safety, and mobility and would benefit from skilled PT intervention. Recommendation is HHPT with intermittent supervision to improve safety.    Follow Up Recommendations Home health PT;Supervision - Intermittent    Equipment Recommendations  Cane    Recommendations for Other Services OT consult     Precautions / Restrictions Precautions Precautions: Fall Restrictions Weight Bearing Restrictions: No      Mobility  Bed Mobility Overal bed mobility: Needs Assistance Bed Mobility: Supine to Sit     Supine to sit: Supervision;HOB elevated          Transfers Overall transfer level: Needs assistance Equipment used: None Transfers: Sit to/from Stand Sit to Stand: Min guard         General transfer comment: no AD, no LOB noted  Ambulation/Gait Ambulation/Gait assistance: Min guard Gait Distance (Feet): 50 Feet Assistive device: None       General Gait Details: Pt able to move slowly, carefully. initially resistant to the idea of a cane, may be agreeable with further education. Decreased coordination of RLE noted  and pt challenged by visual field deficits as well.  Stairs            Wheelchair Mobility    Modified Rankin (Stroke Patients Only)       Balance Overall balance assessment: Needs assistance Sitting-balance support: Feet supported Sitting balance-Leahy Scale: Good       Standing balance-Leahy Scale: Good Standing balance comment: pt able to ambulate slowly, carefully in room, no LOB noted but pt did catch R foot on recliner twice but able to correct  Pertinent Vitals/Pain Pain Assessment: No/denies pain    Home Living Family/patient expects to be discharged to:: Private residence Living Arrangements:  Spouse/significant other Available Help at Discharge: Family;Available 24 hours/day Type of Home: House Home Access: Stairs to enter Entrance Stairs-Rails: Right Entrance Stairs-Number of Steps: 3 Home Layout: One level Home Equipment: None      Prior Function Level of Independence: Independent         Comments: pt independent, drives     Hand Dominance   Dominant Hand: Right    Extremity/Trunk Assessment   Upper Extremity Assessment Upper Extremity Assessment: RUE deficits/detail;LUE deficits/detail RUE Deficits / Details: grossly 4/5 in R shoulder, 4+/5 elbow/grip RUE Sensation: decreased light touch RUE Coordination: decreased gross motor LUE Deficits / Details: grossly 4+/5 LUE Sensation: WNL LUE Coordination: WNL    Lower Extremity Assessment Lower Extremity Assessment: RLE deficits/detail;LLE deficits/detail RLE Deficits / Details: hip flexion 3+/5, knee extension; initially had difficulty with TKE, able to extend fully with extra time/effort 4/5. knee flexion 4/5, ankle DF/PF 4/5 RLE Sensation: decreased light touch RLE Coordination: decreased gross motor LLE Deficits / Details: 5/5 LLE Sensation: WNL LLE Coordination: WNL    Cervical / Trunk Assessment Cervical / Trunk Assessment: Normal  Communication   Communication: No difficulties  Cognition Arousal/Alertness: Awake/alert Behavior During Therapy: WFL for tasks assessed/performed Overall Cognitive Status: Within Functional Limits for tasks assessed                                 General Comments: Pt may have demonstrated deficits in short term memory      General Comments      Exercises     Assessment/Plan    PT Assessment Patient needs continued PT services  PT Problem List Decreased strength;Decreased mobility;Decreased safety awareness;Decreased range of motion;Decreased knowledge of precautions;Decreased activity tolerance;Decreased balance;Decreased knowledge of use of  DME;Impaired sensation;Decreased coordination       PT Treatment Interventions DME instruction;Therapeutic exercise;Gait training;Balance training;Stair training;Neuromuscular re-education;Functional mobility training;Therapeutic activities;Patient/family education    PT Goals (Current goals can be found in the Care Plan section)  Acute Rehab PT Goals Patient Stated Goal: to return to normal PT Goal Formulation: With patient Time For Goal Achievement: 09/22/20 Potential to Achieve Goals: Good    Frequency 7X/week   Barriers to discharge        Co-evaluation               AM-PAC PT "6 Clicks" Mobility  Outcome Measure Help needed turning from your back to your side while in a flat bed without using bedrails?: None Help needed moving from lying on your back to sitting on the side of a flat bed without using bedrails?: None Help needed moving to and from a bed to a chair (including a wheelchair)?: None Help needed standing up from a chair using your arms (e.g., wheelchair or bedside chair)?: None Help needed to walk in hospital room?: A Little Help needed climbing 3-5 steps with a railing? : A Little 6 Click Score: 22    End of Session Equipment Utilized During Treatment: Gait belt Activity Tolerance: Patient tolerated treatment well Patient left: in chair;with chair alarm set;with call bell/phone within reach Nurse Communication: Mobility status PT Visit Diagnosis: Other abnormalities of gait and mobility (R26.89);Difficulty in walking, not elsewhere classified (R26.2)    Time: 7867-6720 PT Time Calculation (min) (ACUTE ONLY): 31 min   Charges:   PT Evaluation $  PT Eval Low Complexity: 1 Low PT Treatments $Therapeutic Exercise: 8-22 mins        Lieutenant Diego PT, DPT 12:48 PM,09/08/20

## 2020-09-08 NOTE — Care Management CC44 (Signed)
Condition Code 44 Documentation Completed  Patient Details  Name: Craig [name] MRN: [id number] Date of Birth: 09-25-1946   Condition Code 44 given:  yes Patient signature on Condition Code 44 notice: spoke to pt on the phone and [name] states he understands   Documentation of 2 MD's agreement: yes, Algis Liming, MD and Ree Kida, MD   Code 44 added to claim: yes      Dellie Catholic, RN 09/08/2020, 5:31 PM

## 2020-09-08 NOTE — Discharge Instructions (Signed)

## 2020-09-08 NOTE — Discharge Summary (Signed)
Physician Discharge Summary  Craig [name] GEX:[id number] DOB: May 25, 1946  PCP: Trinna Post, PA-C  Admitted from: Home Discharged to: Home  Admit date: 09/07/2020 Discharge date: 09/08/2020  Recommendations for Outpatient Follow-up:    Follow-up Information    Trinna Post, PA-C. Schedule an appointment as soon as possible for a visit in 1 week.   Specialty: Physician Assistant Why: Office closed patient to make own follow up appt      To be seen with repeat labs (CBC & BMP). Contact information: [address] [place] [address] [phone]        Anabel Bene, MD. Schedule an appointment as soon as possible for a visit in 2 weeks.   Specialty: Neurology Why: Office closed patient to make own follow up appt Contact information: [facility] [place] [address] [phone]        Schedule an appointment as soon as possible for a visit with Earlie Server, MD.   Specialty: Oncology Why: Office closed patient to make own follow up appt Contact information: [place] [address] [place]:  [place] Orders (From admission, onward)    Start     Ordered   09/08/20 [facility]  At discharge       Question Answer Comment  To provide the following care/treatments PT   To provide the following care/treatments OT      09/08/20 1650           Equipment/Devices:     Durable Medical Equipment  (From admission, onward)         Start     Ordered   09/08/20 1649  DME Cane  Once        09/08/20 1650           Discharge Condition: Improved and stable   Code Status: DNR Diet recommendation:  Discharge Diet Orders (From admission, onward)    Start     Ordered   09/08/20 0000  Diet - low sodium heart healthy        09/08/20 1650   09/08/20 0000  Diet Carb Modified        09/08/20 1650           Discharge Diagnoses:   Principal Problem:   Acute CVA (cerebrovascular accident) [facility]) Active Problems:   Essential (primary) hypertension   Pancreatic cancer metastasized to liver ([place])   Pulmonary embolism ([place])   Diabetes mellitus without complication ([place])   Brief Summary: Please see HPI by Dr.Tochukwu Agbata on 09/07/2020 as copied below:  "HPI: Craig [name] is a 74 y.o. 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 Patient was discharged from the hospital on 11/25 and according to his sister was in his usual state of health until this morning when he called her and asked that she bring him to the hospital because he was weak on his right side.  Patient's last known well was last night when he went to bed and he woke up this morning with symptoms of weakness involving his right lower extremity numbness involving his right upper and lower extremity.  He also had a headache which has resolved but denied having any vision changes or slurred speech.  He was able to ambulate to the car but with assistance and she states that he was dragging his right leg.  He denies having any difficulty swallowing, no chest pain, no nausea, no vomiting, no dizziness, no lightheadedness, no abdominal pain or any changes in his bowel habits, no fever, no cough no or shortness of breath.  A code stroke was called upon patient's arrival to the emergency room. Labs show sodium 136, potassium 3.2, chloride 103, bicarb 22, glucose 118, BUN 10, creatinine 0.69, calcium 8.6, alkaline phosphatase 160, albumin 2.7, AST 38, ALT 19, white count 5.7, hemoglobin 8.5, hematocrit 27.1, RDW 17.8, platelet count 222, PT 17.7, INR 1.5 Initial CT  scan of the head without contrast is negative for bleed and shows no acute intracranial abnormality. CT angiogram of the head and neck showed severe stenosis of the left PCA at the P2/P3 junction with poor opacification of more distal PCA branches.  Otherwise no evidence of large vessel occlusion or hemodynamically significant stenosis intracranially.  Extensive atherosclerosis in the neck, including severe stenosis of the left common carotid origin and approximately 50 to 60% stenosis of the right common carotid artery.  Approximately 50% stenosis of the proximal left cervical ICA.  Severe stenosis of the nondominant right vertebral artery origin with poor opacification of the proximal right vertebral artery and nonopacification distally. Twelve-lead EKG reviewed by me shows sinus rhythm with an incomplete left bundle branch block   ED Course: Patient is a 75 year old African-American male with a history of stage IV pancreatic cancer, on anticoagulation therapy for recently diagnosed pulmonary embolism who was brought in by his sister for evaluation of right-sided weakness and numbness concerning for an acute stroke.  Initial CT scan of the brain was negative for bleed and CT angiogram does not show any large vessel occlusion but shows severe atherosclerosis in the neck.  Patient will be admitted to the hospital for further evaluation."  Patient was admitted for acute embolic stroke, evaluation and management.  Neurology was consulted and assisted.  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    Acute embolic stroke with right hemiparesis:  CT head code stroke: No evidence of acute intracranial abnormality.  Aspects was 10.  MRI brain: Acute left PCA territory infarct  which involves the left occipital lobe, left hippocampus, and left thalamus.  Mild associated edema without substantial mass-effect.  Additionally, there are small infarcts in the left frontal lobe, left caudate and right cerebellar hemisphere.  Given involvement of multiple vascular territories, consider central embolic source.  CTA head and neck: Intracranially, there is suspected severe stenosis of the left PCA at the P2/P3 junction with poor opacification of the more distal PCA branches.  Otherwise, no significant large vessel occlusion or hemodynamically significant stenosis intracranially.  Extensive atherosclerosis in the neck and detailed report has below.  TTE done 11/23: LVEF 55-60%.  This was not repeated.  LDL 73, goal <70.  Continue prior dose of statins and omega-3.  A1c 5.5.  Patient on insulins at home.  Therapies evaluated and recommended home health PT, OT and a cane.  Patient briefly seen to be interested in going to rehab after family member came and spoke  with him but then clearly stated to RN and [facility] team that he is not interested in SNF and wished to be discharged home and stated that he had home intermittent supervision and assistance.  Neurology consultation appreciated.  I discussed with the neurologist.  He indicated that although a TEE could be obtained to assess for possible cardiac source of embolization, patient is already on anticoagulation for his PE and has limited life expectancy, thereby risks of TEE most likely outweigh potential benefits and hence deferred.  He recommended continuing Eliquis both for recent PE diagnosis (likely lifelong) which will also cover for his new embolic stroke diagnosis.  Also continue low-dose aspirin which patient is on likely for his underlying CAD.  We discussed his home antihypertensive regimen.  He is more than 24 hours out of the event.  Also mildly tachycardic this morning on telemetry.  Thereby resume home dose of carvedilol.   However discontinued Cardizem to avoid acute and precipitous drop of blood pressure in the context of acute ischemic stroke until close outpatient follow-up with PCP at which time decision can be made regarding resuming the Cardizem. Outpatient follow-up with local neurologist.    As per therapies evaluation, patient was counseled that he should not continue to drive until cleared by providers during outpatient follow-up.  Patient reported that he had family/friends who can take him for provider appointments.  Type II DM/IDDM with peripheral neuropathy:   A1c of 5.5 suggests good control.  He does not certain as to what is his home insulin regimen.  Close outpatient follow-up with PCP.  Essential hypertension:  Please see discussion above.  Resume carvedilol at discharge.  Hold Cardizem until close outpatient follow-up with PCP.  Stage IV pancreatic cancer:  Patient has metastatic disease to the liver and is on palliative chemotherapy.  I updated patient's primary oncologist of his admission and he is now aware.  Outpatient follow-up.  Recent acute pulmonary embolism/right lower extremity nonocclusive popliteal vein DVT:  Continue prior home dose of Eliquis.  Hypokalemia  Replaced prior to discharge.  Outpatient follow-up.  Anemia of chronic disease/malignancy/chemotherapy  Asymptomatic.  Outpatient follow-up with oncology.   Consultations:  Neurology  Procedures:  None   Discharge Instructions  Discharge Instructions    Call MD for:   Complete by: As directed    Recurrent strokelike symptoms.   Call MD for:  difficulty breathing, headache or visual disturbances   Complete by: As directed    Call MD for:  extreme fatigue   Complete by: As directed    Call MD for:  persistant dizziness or light-headedness   Complete by: As directed    Call MD for:  persistant nausea and vomiting   Complete by: As directed    Call MD for:  severe uncontrolled pain   Complete by: As  directed    Call MD for:  temperature >100.4   Complete by: As directed    Diet - low sodium heart healthy   Complete by: As directed    Diet Carb Modified   Complete by: As directed    Driving Restrictions   Complete by: As directed    Do not drive until cleared by your physician during office visit.   Increase activity slowly   Complete by: As directed        Medication List    STOP taking these medications   diltiazem 300 MG 24 hr capsule Commonly known as: CARDIZEM CD     TAKE these medications  acetaminophen 325 MG tablet Commonly known as: TYLENOL Take 2 tablets (650 mg total) by mouth every 6 (six) hours as needed for fever, headache or moderate pain.   apixaban 5 MG Tabs tablet Commonly known as: ELIQUIS Take 2 tablets (10 mg total) by mouth 2 (two) times daily for 7 days.   apixaban 5 MG Tabs tablet Commonly known as: ELIQUIS Take 1 tablet (5 mg total) by mouth 2 (two) times daily. Start taking on: September 11, 2020   aspirin EC 81 MG tablet Take 81 mg by mouth daily.   capecitabine 500 MG tablet Commonly known as: XELODA Take 3 tablets (1,500 mg total) by mouth 2 (two) times daily after a meal. Take for 14 days on, 7 days off. Repeat every 21 days.   carvedilol 6.25 MG tablet Commonly known as: COREG Take 6.25 mg by mouth 2 (two) times daily with a meal.   docusate sodium 100 MG capsule Commonly known as: Colace Take 1 capsule (100 mg total) by mouth 2 (two) times daily.   dorzolamide-timolol 22.3-6.8 MG/ML ophthalmic solution Commonly known as: COSOPT INSTILL ONE DROP INTO BOTH EYES TWICE DAILY   DULoxetine 30 MG capsule Commonly known as: CYMBALTA TAKE 2 CAPSULES BY MOUTH EVERY DAY   ferrous sulfate 325 (65 FE) MG tablet TAKE 1 TABLET BY MOUTH EVERY DAY WITH BREAKFAST   gabapentin 300 MG capsule Commonly known as: NEURONTIN Take 300 mg by mouth 3 (three) times daily.   insulin lispro 100 UNIT/ML KwikPen Commonly known as: HUMALOG Inject  0.05 mLs (5 Units total) into the skin 3 (three) times daily with meals.   Lantus SoloStar 100 UNIT/ML Solostar Pen Generic drug: insulin glargine Inject 20 Units into the skin at bedtime.   latanoprost 0.005 % ophthalmic solution Commonly known as: XALATAN Place 1 drop into both eyes at bedtime.   lidocaine-prilocaine cream Commonly known as: EMLA APPLY TO AFFECTED AREA ONCE AS DIRECTED   multivitamin with minerals Tabs tablet Take 1 tablet by mouth daily.   OMEGA-3 FATTY ACIDS PO Take 1,200 mg by mouth daily.   omeprazole 40 MG capsule Commonly known as: PRILOSEC TAKE 1 CAPSULE BY MOUTH EVERY DAY   OneTouch Verio test strip Generic drug: glucose blood TEST FASTING SUGAR EVERY MORNING   rosuvastatin 10 MG tablet Commonly known as: CRESTOR Take 1 tablet by mouth daily.   triamcinolone ointment 0.5 % Commonly known as: KENALOG APPLY 1 APPLICATION TOPICALLY 3 (THREE) TIMES DAILY.      Allergies  Allergen Reactions  . Lipitor [Atorvastatin] Other (See Comments)    MYALGIA  . Zetia [Ezetimibe] Other (See Comments)    MYALGIA  . Lisinopril Cough  . Protonix [Pantoprazole] Other (See Comments)    headache  . Spironolactone Other (See Comments)    Breast tenderness      Procedures/Studies: CT Angio Head W or Wo Contrast  Result Date: 09/07/2020 CLINICAL DATA:  Headache with some numbness on the right side. EXAM: CT ANGIOGRAPHY HEAD AND NECK TECHNIQUE: Multidetector CT imaging of the head and neck was performed using the standard protocol during bolus administration of intravenous contrast. Multiplanar CT image reconstructions and MIPs were obtained to evaluate the vascular anatomy. Carotid stenosis measurements (when applicable) are obtained utilizing NASCET criteria, using the distal internal carotid diameter as the denominator. CONTRAST:  80mL OMNIPAQUE IOHEXOL 350 MG/ML SOLN COMPARISON:  Same day head CT. FINDINGS: CTA NECK FINDINGS Aortic arch: Extensive calcific  and noncalcific atherosclerosis of the partially imaged aorta. Great vessel  origins are patent. Right carotid system: Approximately 50-60% stenosis of the right common carotid artery origin. Predominantly calcific atherosclerosis at the carotid bifurcation without greater than 50% narrowing. Left carotid system: The left common carotid artery arises directly from the aorta with severe stenosis secondary atherosclerosis. Mixed calcific and noncalcific atherosclerosis at the carotid bifurcation with approximately 50% stenosis Vertebral arteries: Left dominant. Severe stenosis of the right vertebral artery origin. The right vertebral artery is diminutive and poorly opacified proximally and becomes non-opacified at approximately C2-C3. Moderate atherosclerotic narrowing of the proximal left vertebral artery. Skeleton: Severe degenerative disc disease at C4-C5 C5-C6. No evidence of acute fracture. Other neck: No mass or suspicious adenopathy. Upper chest: Partially imaged opacities in the right upper lobe, similar to prior CT chest. Review of the MIP images confirms the above findings CTA HEAD FINDINGS Anterior circulation: Bilateral petrous and cavernous internal carotid arteries are patent. Bilateral cavernous carotid calcific atherosclerosis without evidence of hemodynamically significant stenosis. Bilateral MCAs are patent without evidence hemodynamically significant proximal stenosis or large vessel occlusion. Right dominant A1 ACA with hypoplastic or absent left A1 ACA. No evidence of hemodynamically significant stenosis or large vessel occlusion involving the ACAs. No aneurysm. Posterior circulation: Non-opacified right intradural vertebral artery. The left intradural vertebral artery and basilar artery are patent. Bilateral PCAs are opacified proximally. Suspected severe stenosis of the left PCA at the P2/P3 junction with poor opacification of more distal PCA branches. Venous sinuses: As permitted by contrast  timing, patent. Small left transverse and sigmoid sinus. Review of the MIP images confirms the above findings IMPRESSION: 1. Intracranially, there is suspected severe stenosis of the left PCA at the P2/P3 junction with poor opacification of more distal PCA branches. Otherwise, no evidence of large vessel occlusion or hemodynamically significant stenosis intracranially. 2.  Extensive atherosclerosis in the neck, including: - Severe stenosis of the left common carotid origin and approximately 50-60% stenosis of the right common carotid artery origin. - Approximately 50% stenosis of the proximal left cervical ICA. - Severe stenosis of the non dominant right vertebral artery origin with poor opacification of the proximal right vertebral artery and non opacification distally. - Moderate narrowing of the dominant left vertebral artery proximally. 3. Partially imaged bilateral pulmonary emboli and right upper lobe opacities, which were better characterized on recent CTA of the chest from August 29, 2020. Findings were communicated on 09/07/2020 at 10:33 am to provider Dr. Charna Archer Via telephone, who verbally acknowledged these results. Electronically Signed   By: Margaretha Sheffield MD   On: 09/07/2020 10:36    CT Angio Neck W and/or Wo Contrast  Result Date: 09/07/2020 CLINICAL DATA:  Headache with some numbness on the right side. EXAM: CT ANGIOGRAPHY HEAD AND NECK TECHNIQUE: Multidetector CT imaging of the head and neck was performed using the standard protocol during bolus administration of intravenous contrast. Multiplanar CT image reconstructions and MIPs were obtained to evaluate the vascular anatomy. Carotid stenosis measurements (when applicable) are obtained utilizing NASCET criteria, using the distal internal carotid diameter as the denominator. CONTRAST:  67mL OMNIPAQUE IOHEXOL 350 MG/ML SOLN COMPARISON:  Same day head CT. FINDINGS: CTA NECK FINDINGS Aortic arch: Extensive calcific and noncalcific  atherosclerosis of the partially imaged aorta. Great vessel origins are patent. Right carotid system: Approximately 50-60% stenosis of the right common carotid artery origin. Predominantly calcific atherosclerosis at the carotid bifurcation without greater than 50% narrowing. Left carotid system: The left common carotid artery arises directly from the aorta with severe stenosis secondary atherosclerosis. Mixed  calcific and noncalcific atherosclerosis at the carotid bifurcation with approximately 50% stenosis Vertebral arteries: Left dominant. Severe stenosis of the right vertebral artery origin. The right vertebral artery is diminutive and poorly opacified proximally and becomes non-opacified at approximately C2-C3. Moderate atherosclerotic narrowing of the proximal left vertebral artery. Skeleton: Severe degenerative disc disease at C4-C5 C5-C6. No evidence of acute fracture. Other neck: No mass or suspicious adenopathy. Upper chest: Partially imaged opacities in the right upper lobe, similar to prior CT chest. Review of the MIP images confirms the above findings CTA HEAD FINDINGS Anterior circulation: Bilateral petrous and cavernous internal carotid arteries are patent. Bilateral cavernous carotid calcific atherosclerosis without evidence of hemodynamically significant stenosis. Bilateral MCAs are patent without evidence hemodynamically significant proximal stenosis or large vessel occlusion. Right dominant A1 ACA with hypoplastic or absent left A1 ACA. No evidence of hemodynamically significant stenosis or large vessel occlusion involving the ACAs. No aneurysm. Posterior circulation: Non-opacified right intradural vertebral artery. The left intradural vertebral artery and basilar artery are patent. Bilateral PCAs are opacified proximally. Suspected severe stenosis of the left PCA at the P2/P3 junction with poor opacification of more distal PCA branches. Venous sinuses: As permitted by contrast timing, patent.  Small left transverse and sigmoid sinus. Review of the MIP images confirms the above findings IMPRESSION: 1. Intracranially, there is suspected severe stenosis of the left PCA at the P2/P3 junction with poor opacification of more distal PCA branches. Otherwise, no evidence of large vessel occlusion or hemodynamically significant stenosis intracranially. 2.  Extensive atherosclerosis in the neck, including: - Severe stenosis of the left common carotid origin and approximately 50-60% stenosis of the right common carotid artery origin. - Approximately 50% stenosis of the proximal left cervical ICA. - Severe stenosis of the non dominant right vertebral artery origin with poor opacification of the proximal right vertebral artery and non opacification distally. - Moderate narrowing of the dominant left vertebral artery proximally. 3. Partially imaged bilateral pulmonary emboli and right upper lobe opacities, which were better characterized on recent CTA of the chest from August 29, 2020. Findings were communicated on 09/07/2020 at 10:33 am to provider Dr. Charna Archer Via telephone, who verbally acknowledged these results. Electronically Signed   By: Margaretha Sheffield MD   On: 09/07/2020 10:36   MR BRAIN WO CONTRAST  Addendum Date: 09/07/2020   ADDENDUM REPORT: 09/07/2020 14:42 ADDENDUM: Findings discussed with Dr. Alexis Goodell via telephone at 2:40 PM. Electronically Signed   By: Margaretha Sheffield MD   On: 09/07/2020 14:42   Result Date: 09/07/2020 EXAM: MRI HEAD WITHOUT CONTRAST TECHNIQUE: Multiplanar, multiecho pulse sequences of the brain and surrounding structures were obtained without intravenous contrast. COMPARISON:  Same day CT exams. FINDINGS: Brain: Acute left PCA territory infarct which involves the left occipital lobe, left hippocampus, and left thalamus. Mild associated edema without substantial mass effect. Additionally, there small infarcts in the left frontal lobe, left caudate, and right  cerebellar hemisphere. No hydrocephalus. No mass lesion or suspect antral mass effect. No extra-axial fluid collection. Vascular: Further evaluated on same day CTA Skull and upper cervical spine: Normal marrow signal. Sinuses/Orbits: No acute finding. Other: No mastoid effusions. IMPRESSION: 1. Acute left PCA territory infarct which involves the left occipital lobe, left hippocampus, and left thalamus. Mild associated edema without substantial mass effect. 2. Additionally, there small infarcts in the left frontal lobe, left caudate, and right cerebellar hemisphere. Given involvement of multiple vascular territories, consider centrum embolic source. Electronically Signed: By: Margaretha Sheffield MD On:  09/07/2020 14:13    ECHOCARDIOGRAM COMPLETE  Result Date: 09/03/2020    ECHOCARDIOGRAM REPORT   Patient Name:   Craig [name] Date of Exam: 09/02/2020 Medical Rec #:  [id number]       Height:       69.0 in Accession #:    [id number]      Weight:       160.0 lb Date of Birth:  Dec 19, 1945       BSA:          1.879 m Patient Age:    2 years        BP:           165/84 mmHg Patient Gender: M               HR:           85 bpm. Exam Location:  [facility] Procedure: 2D Echo, Color Doppler and Cardiac Doppler Indications:     I26.99 Pulmonary Embolus  History:         Patient has prior history of Echocardiogram examinations. CHF,                  CAD, Prior CABG; Risk Factors:Hypertension and Dyslipidemia.  Sonographer:     Charmayne Sheer RDCS (AE) Referring Phys:  [id number] Ivor Costa Diagnosing Phys: Yolonda Kida MD  Sonographer Comments: Suboptimal subcostal window. IMPRESSIONS  1. Left ventricular ejection fraction, by estimation, is 55 to 60%. The left ventricle has normal function. The left ventricle has no regional wall motion abnormalities. Left ventricular diastolic parameters are consistent with Grade I diastolic dysfunction (impaired relaxation).  2. Right ventricular systolic function is normal. The right ventricular  size is normal.  3. The mitral valve is normal in structure. No evidence of mitral valve regurgitation.  4. The aortic valve is grossly normal. There is mild calcification of the aortic valve. There is mild thickening of the aortic valve. Aortic valve regurgitation is not visualized. Mild to moderate aortic valve sclerosis/calcification is present, without  any evidence of aortic stenosis. FINDINGS  Left Ventricle: Left ventricular ejection fraction, by estimation, is 55 to 60%. The left ventricle has normal function. The left ventricle has no regional wall motion abnormalities. The left ventricular internal cavity size was normal in size. There is  no left ventricular hypertrophy. Left ventricular diastolic parameters are consistent with Grade I diastolic dysfunction (impaired relaxation). Right Ventricle: The right ventricular size is normal. No increase in right ventricular wall thickness. Right ventricular systolic function is normal. Left Atrium: Left atrial size was normal in size. Right Atrium: Right atrial size was normal in size. Pericardium: There is no evidence of pericardial effusion. Mitral Valve: The mitral valve is normal in structure. No evidence of mitral valve regurgitation. MV peak gradient, 5.0 mmHg. The mean mitral valve gradient is 2.0 mmHg. Tricuspid Valve: The tricuspid valve is normal in structure. Tricuspid valve regurgitation is not demonstrated. Aortic Valve: The aortic valve is grossly normal. There is mild calcification of the aortic valve. There is mild thickening of the aortic valve. There is mild aortic valve annular calcification. Aortic valve regurgitation is not visualized. Mild to moderate aortic valve sclerosis/calcification is present, without any evidence of aortic stenosis. Aortic valve mean gradient measures 3.0 mmHg. Aortic valve peak gradient measures 5.3 mmHg. Aortic valve area, by VTI measures 1.71 cm. Pulmonic Valve: The pulmonic valve was grossly normal. Pulmonic  valve regurgitation is not visualized. Aorta: The ascending aorta was not well visualized. IAS/Shunts:  No atrial level shunt detected by color flow Doppler.  LEFT VENTRICLE PLAX 2D LVIDd:         4.06 cm  Diastology LVIDs:         2.85 cm  LV e' medial:    5.33 cm/s LV PW:         0.97 cm  LV E/e' medial:  14.0 LV IVS:        0.78 cm  LV e' lateral:   8.05 cm/s LVOT diam:     2.00 cm  LV E/e' lateral: 9.3 LV SV:         33 LV SV Index:   17 LVOT Area:     3.14 cm  RIGHT VENTRICLE RV Basal diam:  2.50 cm RV Mid diam:    3.64 cm LEFT ATRIUM           Index       RIGHT ATRIUM          Index LA diam:      2.90 cm 1.54 cm/m  RA Area:     8.63 cm LA Vol (A2C): 26.3 ml 14.00 ml/m RA Volume:   13.40 ml 7.13 ml/m LA Vol (A4C): 24.5 ml 13.04 ml/m  AORTIC VALVE                   PULMONIC VALVE AV Area (Vmax):    1.87 cm    PV Vmax:       0.67 m/s AV Area (Vmean):   1.89 cm    PV Vmean:      50.000 cm/s AV Area (VTI):     1.71 cm    PV VTI:        0.118 m AV Vmax:           115.00 cm/s PV Peak grad:  1.8 mmHg AV Vmean:          76.500 cm/s PV Mean grad:  1.0 mmHg AV VTI:            0.191 m AV Peak Grad:      5.3 mmHg AV Mean Grad:      3.0 mmHg LVOT Vmax:         68.50 cm/s LVOT Vmean:        46.100 cm/s LVOT VTI:          0.104 m LVOT/AV VTI ratio: 0.54  AORTA Ao Root diam: 3.10 cm MITRAL VALVE MV Area (PHT): 5.20 cm     SHUNTS MV Peak grad:  5.0 mmHg     Systemic VTI:  0.10 m MV Mean grad:  2.0 mmHg     Systemic Diam: 2.00 cm MV Vmax:       1.12 m/s MV Vmean:      59.6 cm/s MV Decel Time: 146 msec MV E velocity: 74.60 cm/s MV A velocity: 110.00 cm/s MV E/A ratio:  0.68 Dwayne D Callwood MD Electronically signed by Yolonda Kida MD Signature Date/Time: 09/03/2020/4:31:23 PM    Final    CT HEAD CODE STROKE WO CONTRAST  Result Date: 09/07/2020 CLINICAL DATA:  Code stroke. Neuro deficit, acute stroke suspected. Right-sided weakness and headache. EXAM: CT HEAD WITHOUT CONTRAST TECHNIQUE: Contiguous axial images  were obtained from the base of the skull through the vertex without intravenous contrast. COMPARISON:  None. FINDINGS: Brain: No evidence of acute large vascular territory infarction, hemorrhage, hydrocephalus, extra-axial collection or mass lesion/mass effect. 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 Vascular: Intracranial calcific atherosclerosis. No hyperdense vessel identified. Skull: No acute fracture. Sinuses/Orbits: No acute findings. Other: No mastoid effusions. ASPECTS [facility] Stroke Program Early CT Score) total score (0-10 with 10 being normal): 10. IMPRESSION: No evidence of acute intracranial abnormality.  ASPECTS is 10. Code stroke imaging results were communicated on 09/07/2020 at 9:54 am to provider Dr. Charna Archer via telephone, who verbally acknowledged these results. Electronically Signed   By: Margaretha Sheffield MD   On: 09/07/2020 09:57      Subjective: Patient reports some blurred vision in both eyes.  Does have bilateral near mature cataract.  Denies diplopia or headache.  Some right hand numbness but feels that his right leg weakness has improved.  No dizziness, lightheadedness, chest pain, dyspnea or palpitations.  Discharge Exam:  Vitals:   09/08/20 0607 09/08/20 0815 09/08/20 1200 09/08/20 1608  BP: (!) 154/93 (!) 144/86 109/62 140/80  Pulse: (!) 109 100 96 95  Resp: 16 18 18 20   Temp: 98 F (36.7 C) 98.3 F (36.8 C) 98.3 F (36.8 C) 98.7 F (37.1 C)  TempSrc:      SpO2: 100% 100% 100% 100%  Weight:      Height:        General: Elderly male, moderately built and nourished lying comfortably propped up in bed without distress. Cardiovascular: S1 & S2 heard, RRR, S1/S2 +. No murmurs, rubs, gallops or clicks. No JVD or pedal edema.  Telemetry personally reviewed: Sinus rhythm-mild sinus tachycardia in the low 100s Respiratory: Clear to auscultation without wheezing, rhonchi or crackles. No  increased work of breathing. Abdominal:  Non distended, non tender & soft. No organomegaly or masses appreciated. Normal bowel sounds heard. CNS: Alert and oriented. No focal deficits.  Specifically no dysarthria, aphasia or facial asymmetry. Extremities: no edema, no cyanosis.  Grade 5 x 5 power in the left limbs, right upper extremity including grip but 4+ by 5 power in right lower extremity.    The results of significant diagnostics from this hospitalization (including imaging, microbiology, ancillary and laboratory) are listed below for reference.     Microbiology: Recent Results (from the past 240 hour(s))  Resp Panel by RT-PCR (Flu A&B, Covid) Nasopharyngeal Swab     Status: None   Collection Time: 09/02/20  2:37 AM   Specimen: Nasopharyngeal Swab; Nasopharyngeal(NP) swabs in vial transport medium  Result Value Ref Range Status   SARS Coronavirus 2 by RT PCR NEGATIVE NEGATIVE Final    Comment: (NOTE) SARS-CoV-2 target nucleic acids are NOT DETECTED.  The SARS-CoV-2 RNA is generally detectable in upper respiratory specimens during the acute phase of infection. The lowest concentration of SARS-CoV-2 viral copies this assay can detect is 138 copies/mL. A negative result does not preclude SARS-Cov-2 infection and should not be used as the sole basis for treatment or other patient management decisions. A negative result may occur with  improper specimen collection/handling, submission of specimen other than nasopharyngeal swab, presence of viral mutation(s) within the areas targeted by this assay, and inadequate number of viral copies(<138 copies/mL). A negative result must be combined with clinical observations, patient history, and epidemiological information. The expected result is Negative.  Fact Sheet for Patients:  EntrepreneurPulse.com.au  Fact Sheet for Healthcare Providers:  IncredibleEmployment.be  This test is no t yet approved or  cleared by the [place] FDA and  has been authorized for detection and/or diagnosis of SARS-CoV-2 by FDA under an Emergency Use Authorization (EUA). This EUA will remain  in effect (meaning this test can be used) for the duration of the COVID-19 declaration under Section 564(b)(1) of the Act, 21 U.S.C.section 360bbb-3(b)(1), unless the authorization is terminated  or revoked sooner.       Influenza A by PCR NEGATIVE NEGATIVE Final   Influenza B by PCR NEGATIVE NEGATIVE Final    Comment: (NOTE) The Xpert Xpress SARS-CoV-2/FLU/RSV plus assay is intended as an aid in the diagnosis of influenza from Nasopharyngeal swab specimens and should not be used as a sole basis for treatment. Nasal washings and aspirates are unacceptable for Xpert Xpress SARS-CoV-2/FLU/RSV testing.  Fact Sheet for Patients: EntrepreneurPulse.com.au  Fact Sheet for Healthcare Providers: IncredibleEmployment.be  This test is not yet approved or cleared by the [place] FDA and has been authorized for detection and/or diagnosis of SARS-CoV-2 by FDA under an Emergency Use Authorization (EUA). This EUA will remain in effect (meaning this test can be used) for the duration of the COVID-19 declaration under Section 564(b)(1) of the Act, 21 U.S.C. section 360bbb-3(b)(1), unless the authorization is terminated or revoked.  Performed at [facility], [address] [place], [place] [address]   Resp Panel by RT-PCR (Flu A&B, Covid)     Status: None   Collection Time: 09/07/20 11:45 AM   Specimen: Nasopharyngeal(NP) swabs in vial transport medium  Result Value Ref Range Status   SARS Coronavirus 2 by RT PCR NEGATIVE NEGATIVE Final    Comment: (NOTE) SARS-CoV-2 target nucleic acids are NOT DETECTED.  The SARS-CoV-2 RNA is generally detectable in upper respiratory specimens during the acute phase of infection. The lowest concentration of SARS-CoV-2 viral copies  this assay can detect is 138 copies/mL. A negative result does not preclude SARS-Cov-2 infection and should not be used as the sole basis for treatment or other patient management decisions. A negative result may occur with  improper specimen collection/handling, submission of specimen other than nasopharyngeal swab, presence of viral mutation(s) within the areas targeted by this assay, and inadequate number of viral copies(<138 copies/mL). A negative result must be combined with clinical observations, patient history, and epidemiological information. The expected result is Negative.  Fact Sheet for Patients:  EntrepreneurPulse.com.au  Fact Sheet for Healthcare Providers:  IncredibleEmployment.be  This test is no t yet approved or cleared by the [place] FDA and  has been authorized for detection and/or diagnosis of SARS-CoV-2 by FDA under an Emergency Use Authorization (EUA). This EUA will remain  in effect (meaning this test can be used) for the duration of the COVID-19 declaration under Section 564(b)(1) of the Act, 21 U.S.C.section 360bbb-3(b)(1), unless the authorization is terminated  or revoked sooner.       Influenza A by PCR NEGATIVE NEGATIVE Final   Influenza B by PCR NEGATIVE NEGATIVE Final    Comment: (NOTE) The Xpert Xpress SARS-CoV-2/FLU/RSV plus assay is intended as an aid in the diagnosis of influenza from Nasopharyngeal swab specimens and should not be used as a sole basis for treatment. Nasal washings and aspirates are unacceptable for Xpert Xpress SARS-CoV-2/FLU/RSV testing.  Fact Sheet for Patients: EntrepreneurPulse.com.au  Fact Sheet for Healthcare Providers: IncredibleEmployment.be  This test is not yet approved or cleared by the [place] FDA and has been authorized for detection and/or diagnosis of SARS-CoV-2 by FDA under an Emergency Use Authorization (EUA). This EUA  will remain in effect (meaning this test can be used) for the duration of the COVID-19 declaration under Section 564(b)(1) of the Act, 21 U.S.C. section 360bbb-3(b)(1), unless the authorization  is terminated or revoked.  Performed at [facility], [address] [place], [place] [address]      Labs: CBC: Recent Labs  Lab 09/02/20 0237 09/03/20 0432 09/04/20 0711 09/07/20 1004  WBC 7.5 6.1 5.7 5.7  NEUTROABS 4.9  --   --  3.5  HGB 9.6* 9.0* 8.7* 8.5*  HCT 31.1* 29.1* 28.4* 27.1*  MCV 94.0 93.6 93.1 93.1  PLT 200 221 243 335    Basic Metabolic Panel: Recent Labs  Lab 09/02/20 0237 09/07/20 1004  NA 138 136  K 3.5 3.2*  CL 104 103  CO2 22 22  GLUCOSE 127* 118*  BUN 14 10  CREATININE 0.91 0.69  CALCIUM 9.1 8.6*  MG 1.8  --     Liver Function Tests: Recent Labs  Lab 09/02/20 0237 09/07/20 1004  AST 39 38  ALT 22 19  ALKPHOS 174* 160*  BILITOT 1.0 0.9  PROT 6.6 6.0*  ALBUMIN 3.0* 2.7*    CBG: Recent Labs  Lab 09/08/20 0104 09/08/20 0447 09/08/20 0817 09/08/20 1159 09/08/20 1604  GLUCAP 85 125* 106* 155* 94    Hgb A1c Recent Labs    09/07/20 1145 09/08/20 0432  HGBA1C 5.4 5.5    Lipid Profile Recent Labs    09/08/20 0432  CHOL 144  HDL 35*  LDLCALC 73  TRIG 178*  CHOLHDL 4.1   Urinalysis    Component Value Date/Time   COLORURINE STRAW (A) 09/07/2020 1004   APPEARANCEUR CLEAR (A) 09/07/2020 1004   LABSPEC 1.020 09/07/2020 1004   PHURINE 7.0 09/07/2020 1004   GLUCOSEU NEGATIVE 09/07/2020 1004   HGBUR NEGATIVE 09/07/2020 1004   BILIRUBINUR NEGATIVE 09/07/2020 1004   BILIRUBINUR neg 01/24/2020 1223   [place] 09/07/2020 1004   PROTEINUR NEGATIVE 09/07/2020 1004   UROBILINOGEN 0.2 01/24/2020 1223   NITRITE NEGATIVE 09/07/2020 1004   [place] 09/07/2020 1004    I attempted to but was unsuccessful in reaching patient's spouse on her cell phone and home phone to update care and answer any  questions she may have.  There was no voicemail set up to her cell phone number.  RN informed me that the patient reported that he will be assisted by his spouse and son-in-law at home.  Time coordinating discharge: 40 minutes  SIGNED:  Vernell Leep, MD, [place], [facility]. [facility]  To contact the attending provider between 7A-7P or the covering provider during after hours 7P-7A, please log into the web site [email or web address] and access using universal [place] password for that web site. If you do not have the password, please call the hospital operator.

## 2020-09-08 NOTE — Care Management Obs Status (Signed)
[place] NOTIFICATION   Patient Details  Name: Kylian Loh MRN: [id number] Date of Birth: 03/30/1946   Medicare Observation Status Notification Given:  Yes (discussed with [name] on phone)    Dellie Catholic, RN 09/08/2020, 5:40 PM

## 2020-09-08 NOTE — Consult Note (Signed)
Hematology/Oncology Consult note [facility] [phone] [phone]  Patient Care Team: Paulene Floor as PCP - General (Physician Assistant) Clent Jacks, RN as Oncology Nurse Navigator Earlie Server, MD as Consulting Physician (Hematology and Oncology) Neldon Labella, RN as Case Manager Lonia Farber, MD as Consulting Physician (Internal Medicine)   Name of the patient: Craig [name]  [id number]  [birth year]   Date of visit: 09/08/20 REASON FOR COSULTATION:  Stage IV pancreatic cancer History of presenting illness-  74 y.o. male with PMH listed at below who presents to ER for evaluation of sudden onset of right-sided weakness.  Patient was last known well the night before and woke up with symptoms.  Right lower extremity numbness involving upper and lower extremity.  Also had a headache.  No dysphagia, chest pain, nausea vomiting lightheaded, syncope.  Initial CT in the emergency room showed negative for bleeding and no acute intracranial abnormality.  CT angiogram of the head and neck showed severe stenosis of the left PCA with poor opacification of more distal PCA branches.  Extensive atherosclerosis in the neck including severe stenosis of the left common carotid origin and 50 to 60% stenosis of the right common carotid artery.  50% stenosis of the proximal left cervical ICA.  Severe stenosis of the nondominant right vertebral artery. Patient was admitted for further work-up of stroke. 09/02/2020 TTE showed LVEF 55-60% 09/07/2020 MRI brain without contrast showed acute left PCA territory infarct which involves the left occipital lobe, left left hippocampus, and left thalamus. Mild associated, edema without substantial mass effect.. Additionally, there small infarcts in the left frontal lobe, left caudate, and right cerebellar hemisphere Patient was seen by neurology, TEE could be obtained to assess for possible cardiac source of  embolization however patient is already on anticoagulation for PE and has limited life expectancy, risk of TEE more likely outweigh potential benefits and has deferred.  Neurology recommended continue Eliquis for both stroke and PE, continue low-dose aspirin.  Patient is known to oncology service for history of stage IV pancreatic cancer, who recently has progressed after multiple lines of chemotherapy treatments.  On anticoagulation therapy due to history of pulmonary embolism. Is supposed to have an appointment to see me today for discussion of recent CT scan and further management plan.   Review of Systems  Constitutional: Positive for appetite change and fatigue. Negative for chills and fever.  HENT:   Negative for hearing loss and voice change.   Eyes: Negative for eye problems and icterus.  Respiratory: Negative for chest tightness, cough and shortness of breath.   Cardiovascular: Negative for chest pain and leg swelling.  Gastrointestinal: Negative for abdominal distention and abdominal pain.  Endocrine: Negative for hot flashes.  Genitourinary: Negative for difficulty urinating, dysuria and frequency.   Musculoskeletal: Negative for arthralgias.       .  Right side weakness  Skin: Negative for itching and rash.  Neurological: Negative for light-headedness and numbness.  Hematological: Negative for adenopathy. Does not bruise/bleed easily.  Psychiatric/Behavioral: Negative for confusion.    Allergies  Allergen Reactions  . Lipitor [Atorvastatin] Other (See Comments)    MYALGIA  . Zetia [Ezetimibe] Other (See Comments)    MYALGIA  . Lisinopril Cough  . Protonix [Pantoprazole] Other (See Comments)    headache  . Spironolactone Other (See Comments)    Breast tenderness    Patient Active Problem List   Diagnosis Date Noted  . Acute CVA (cerebrovascular accident) ([place]) 09/07/2020  .  Pulmonary embolism ([place]) 09/02/2020  . Diabetes mellitus without complication ([place])  59/93/5701  . Depression 09/02/2020  . Chronic systolic CHF (congestive heart failure) ([place]) 09/02/2020  . Genetic testing 09/01/2020  . Flatulence 08/25/2020  . Constipation 08/25/2020  . RUQ abdominal pain 08/25/2020  . Family history of brain cancer   . Family history of cancer   . Neuropathy 06/18/2020  . Hypokalemia 03/17/2020  . 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 Malnutrition of moderate degree 01/26/2020  . DKA (diabetic ketoacidoses) 01/24/2020  . Fatigue 10/03/2019  . Low-level of literacy 06/29/2019  . Abnormal LFTs 06/29/2019  . AKI (acute kidney injury) ([place]) 06/29/2019  . Diarrhea 06/29/2019  . Orthostatic hypotension 06/29/2019  . Hyperglycemia 06/29/2019  . Anemia due to antineoplastic chemotherapy 06/29/2019  . Dehydration 06/21/2019  . Encounter for antineoplastic chemotherapy 06/21/2019  . Normocytic anemia 06/21/2019  . Port-A-Cath in place 06/07/2019  . Goals of care, counseling/discussion 06/01/2019  . Pancreatic cancer metastasized to liver ([place]) 06/01/2019  . Liver metastasis ([place]) 06/01/2019  . CAD (coronary artery disease) 05/17/2019  . Personal history of tobacco use, presenting hazards to health 05/10/2019  . Peripheral arterial disease ([place]) 05/09/2019  . Prediabetes 05/01/2019  . LVH (left ventricular hypertrophy) due to hypertensive disease, without heart failure 01/04/2018  . 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 Postoperative anemia   . Coronary artery disease involving native coronary artery of native heart   . Coronary artery disease involving native coronary artery of native heart 09/16/2017  . Chronic shortness of breath 08/11/2017  . Tobacco abuse 08/11/2017  . Spondylolisthesis, lumbar region 03/16/2017  . HLD (hyperlipidemia) 09/10/2015  . 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 Essential (primary) hypertension 09/10/2015  . Degeneration of cervical intervertebral disc 09/10/2015  . Allergic rhinitis 09/10/2015  . GERD (gastroesophageal  reflux disease) 06/25/2015  . Cervical stenosis of spinal canal 06/25/2015  . Atherosclerosis of native arteries of extremity with intermittent claudication ([place]) 06/25/2015     Past Medical History:  Diagnosis Date  . Allergic rhinitis   . CHF (congestive heart failure) ([place])   . Chronic cholecystitis   . Coronary artery disease   . DDD (degenerative disc disease), cervical   . Dehydration 06/21/2019  . Diabetes mellitus without complication ([place])   . Dyspnea   . Early cataract   . Erectile dysfunction   . Family history of brain cancer   . Family history of cancer   . GERD (gastroesophageal reflux disease)   . Gouty arthritis   . Headache    occasional migraines  . Hypercalcemia   . Hyperlipemia   . Hypertension   . Palpitations   . Pancreatic cancer metastasized to liver ([place]) 05/2019   Chemo tx's  . Peripheral vascular disease ([place])   . 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 Spinal stenosis of cervical region   . Vitamin D deficiency      Past Surgical History:  Procedure Laterality Date  . BACK SURGERY  2018    fusion with screws  . CHOLECYSTECTOMY N/A 11/13/2018   Procedure: LAPAROSCOPIC CHOLECYSTECTOMY;  Surgeon: Vickie Epley, MD;  Location: [facility];  Service: General;  Laterality: N/A;  . COLONOSCOPY    . CORONARY ARTERY BYPASS GRAFT N/A 09/30/2017   Procedure: CORONARY ARTERY BYPASS GRAFTING times five using right and left Saphaneous vein harvested endoscopicly  and left internal mammary artery. (CABG),TEE;  Surgeon: Rexene Alberts, MD;  Location: [facility];  Service: Open Heart Surgery;  Laterality: N/A;  . LEFT HEART CATH AND CORONARY ANGIOGRAPHY Left 09/06/2017   Procedure: LEFT HEART CATH AND CORONARY ANGIOGRAPHY;  Surgeon: Corey Skains, MD;  Location: [facility];  Service: Cardiovascular;  Laterality: Left;  . percutaneous transluminal balloon angioplasty   01/2010   of left lower extremity  . PORTA CATH INSERTION N/A 06/06/2019   Procedure: PORTA CATH INSERTION;  Surgeon: Katha Cabal, MD;  Location: [facility];  Service: Cardiovascular;  Laterality: N/A;  . TEE WITHOUT CARDIOVERSION N/A 09/30/2017   Procedure: TRANSESOPHAGEAL ECHOCARDIOGRAM (TEE);  Surgeon: Rexene Alberts, MD;  Location: [facility];  Service: Open Heart Surgery;  Laterality: N/A;  . VASCULAR SURGERY Left 2010   left exernal iliac and superficial femoral artery PTCA and stenting    Social History   Socioeconomic History  . Marital status: Married    Spouse name: Enid Derry  . Number of children: 2  . Years of education: Not on file  . Highest education level: 5th grade  Occupational History  . Occupation: drove tractors    Comment: retired  Tobacco Use  . Smoking status: Former Smoker    Packs/day: 0.75    Years: 50.00    Pack years: 37.50    Types: Cigarettes    Quit date: 09/27/2017    Years since quitting: 2.9  . Smokeless tobacco: Former Systems developer    Types: Secondary school teacher  . Vaping Use: Never used  Substance and Sexual Activity  . Alcohol use: Not Currently    Comment: stopped drinking before cabg  . Drug use: Not Currently    Types: Marijuana    Comment: stopped smoking before CABG  . Sexual activity: Not Currently  Other Topics Concern  . Not on file  Social History Narrative  . Not on file   Social Determinants of Health   Financial Resource Strain: Low Risk   . Difficulty of Paying Living Expenses: Not hard at all  Food Insecurity: No Food Insecurity  . Worried About Charity fundraiser in the Last Year: Never true  . Ran Out of Food in the Last Year: Never true  Transportation Needs: No Transportation Needs  . Lack of Transportation (Medical): No  . Lack of Transportation (Non-Medical): No  Physical Activity: Inactive  . Days of Exercise per Week: 0 days  . Minutes of Exercise per Session: 0 min  Stress: No Stress Concern Present   . Feeling of Stress : Not at all  Social Connections: Moderately Isolated  . Frequency of Communication with Friends and Family: More than three times a week  . Frequency of Social Gatherings with Friends and Family: More than three times a week  . Attends Religious Services: Never  . Active Member of Clubs or Organizations: No  . Attends Archivist Meetings: Never  . Marital Status: Married  Human resources officer Violence: Not At Risk  . Fear of Current or Ex-Partner: No  . Emotionally Abused: No  . Physically Abused: No  . Sexually Abused: No     Family History  Problem Relation Age of Onset  . Brain cancer Mother   . Emphysema Father   . Cancer Brother      Current Facility-Administered Medications:  .   stroke: mapping our early stages of recovery book, , Does not apply, Once, Agbata, Tochukwu, MD .  acetaminophen (TYLENOL) tablet 650 mg, 650  mg, Oral, Q6H PRN, Agbata, Tochukwu, MD, 650 mg at 09/08/20 0836 .  apixaban (ELIQUIS) tablet 10 mg, 10 mg, Oral, BID, 10 mg at 09/08/20 0837 **FOLLOWED BY** [START ON 09/11/2020] apixaban (ELIQUIS) tablet 5 mg, 5 mg, Oral, BID, Agbata, Tochukwu, MD .  aspirin EC tablet 81 mg, 81 mg, Oral, Daily, Agbata, Tochukwu, MD, 81 mg at 09/08/20 0835 .  capecitabine (XELODA) tablet 1,500 mg, 1,500 mg, Oral, BID PC, Agbata, Tochukwu, MD .  docusate sodium (COLACE) capsule 100 mg, 100 mg, Oral, BID, Agbata, Tochukwu, MD, 100 mg at 09/08/20 0835 .  dorzolamide-timolol (COSOPT) 22.3-6.8 MG/ML ophthalmic solution 1 drop, 1 drop, Both Eyes, BID, Agbata, Tochukwu, MD, 1 drop at 09/08/20 0838 .  DULoxetine (CYMBALTA) DR capsule 60 mg, 60 mg, Oral, Daily, Agbata, Tochukwu, MD, 60 mg at 09/08/20 0835 .  ferrous sulfate tablet 325 mg, 325 mg, Oral, BID WC, Agbata, Tochukwu, MD, 325 mg at 09/08/20 0835 .  gabapentin (NEURONTIN) capsule 300 mg, 300 mg, Oral, TID, Agbata, Tochukwu, MD, 300 mg at 09/08/20 0834 .  insulin aspart (novoLOG) injection 0-15 Units,  0-15 Units, Subcutaneous, Q4H, Agbata, Tochukwu, MD, 3 Units at 09/08/20 1313 .  labetalol (NORMODYNE) injection 10 mg, 10 mg, Intravenous, Q6H PRN, Agbata, Tochukwu, MD .  latanoprost (XALATAN) 0.005 % ophthalmic solution 1 drop, 1 drop, Both Eyes, QHS, Agbata, Tochukwu, MD, 1 drop at 09/07/20 2257 .  lidocaine-prilocaine (EMLA) cream, , Topical, Once PRN, Agbata, Tochukwu, MD .  multivitamin with minerals tablet 1 tablet, 1 tablet, Oral, Daily, Agbata, Tochukwu, MD, 1 tablet at 09/08/20 0834 .  omega-3 acid ethyl esters (LOVAZA) capsule 1 g, 1 g, Oral, BID, Agbata, Tochukwu, MD, 1 g at 09/08/20 0837 .  pantoprazole (PROTONIX) EC tablet 40 mg, 40 mg, Oral, Daily, Agbata, Tochukwu, MD, 40 mg at 09/08/20 0837 .  rosuvastatin (CRESTOR) tablet 10 mg, 10 mg, Oral, Daily, Agbata, Tochukwu, MD, 10 mg at 09/08/20 0835 .  senna-docusate (Senokot-S) tablet 1 tablet, 1 tablet, Oral, QHS PRN, Agbata, Tochukwu, MD  Facility-Administered Medications Ordered in Other Encounters:  .  sodium chloride flush (NS) 0.9 % injection 10 mL, 10 mL, Intracatheter, PRN, Earlie Server, MD .  sodium chloride flush (NS) 0.9 % injection 10 mL, 10 mL, Intravenous, Once, Earlie Server, MD   Physical exam:  Vitals:   09/08/20 0607 09/08/20 0815 09/08/20 1200 09/08/20 1608  BP: (!) 154/93 (!) 144/86 109/62 140/80  Pulse: (!) 109 100 96 95  Resp: 16 18 18 20   Temp: 98 F (36.7 C) 98.3 F (36.8 C) 98.3 F (36.8 C) 98.7 F (37.1 C)  TempSrc:      SpO2: 100% 100% 100% 100%  Weight:      Height:       Physical Exam Constitutional:      General: He is not in acute distress.    Appearance: He is not diaphoretic.  HENT:     Head: Normocephalic and atraumatic.     Nose: Nose normal.     Mouth/Throat:     Pharynx: No oropharyngeal exudate.  Eyes:     General: No scleral icterus.    Pupils: Pupils are equal, round, and reactive to light.  Cardiovascular:     Rate and Rhythm: Normal rate and regular rhythm.     Heart sounds:  No murmur heard.   Pulmonary:     Effort: Pulmonary effort is normal. No respiratory distress.     Breath sounds: No rales.  Chest:  Chest wall: No tenderness.  Abdominal:     General: There is no distension.     Palpations: Abdomen is soft.     Tenderness: There is no abdominal tenderness.  Musculoskeletal:        General: Normal range of motion.     Cervical back: Normal range of motion and neck supple.  Skin:    General: Skin is warm and dry.     Findings: No erythema.  Neurological:     Mental Status: He is alert and oriented to person, place, and time.     Cranial Nerves: No cranial nerve deficit.     Sensory: Sensory deficit present.     Motor: No abnormal muscle tone.     Coordination: Coordination normal.  Psychiatric:        Mood and Affect: Affect normal.         CMP Latest Ref Rng & Units 09/07/2020  Glucose 70 - 99 mg/dL 118(H)  BUN 8 - 23 mg/dL 10  Creatinine 0.61 - 1.24 mg/dL 0.69  Sodium 135 - 145 mmol/L 136  Potassium 3.5 - 5.1 mmol/L 3.2(L)  Chloride 98 - 111 mmol/L 103  CO2 22 - 32 mmol/L 22  Calcium 8.9 - 10.3 mg/dL 8.6(L)  Total Protein 6.5 - 8.1 g/dL 6.0(L)  Total Bilirubin 0.3 - 1.2 mg/dL 0.9  Alkaline Phos 38 - 126 U/L 160(H)  AST 15 - 41 U/L 38  ALT 0 - 44 U/L 19   CBC Latest Ref Rng & Units 09/07/2020  WBC 4.0 - 10.5 K/uL 5.7  Hemoglobin 13.0 - 17.0 g/dL 8.5(L)  Hematocrit 39 - 52 % 27.1(L)  Platelets 150 - 400 K/uL 222    RADIOGRAPHIC STUDIES: I have personally reviewed the radiological images as listed and agreed with the findings in the report. CT Angio Head W or Wo Contrast  Result Date: 09/07/2020 CLINICAL DATA:  Headache with some numbness on the right side. EXAM: CT ANGIOGRAPHY HEAD AND NECK TECHNIQUE: Multidetector CT imaging of the head and neck was performed using the standard protocol during bolus administration of intravenous contrast. Multiplanar CT image reconstructions and MIPs were obtained to evaluate the vascular  anatomy. Carotid stenosis measurements (when applicable) are obtained utilizing NASCET criteria, using the distal internal carotid diameter as the denominator. CONTRAST:  42mL OMNIPAQUE IOHEXOL 350 MG/ML SOLN COMPARISON:  Same day head CT. FINDINGS: CTA NECK FINDINGS Aortic arch: Extensive calcific and noncalcific atherosclerosis of the partially imaged aorta. Great vessel origins are patent. Right carotid system: Approximately 50-60% stenosis of the right common carotid artery origin. Predominantly calcific atherosclerosis at the carotid bifurcation without greater than 50% narrowing. Left carotid system: The left common carotid artery arises directly from the aorta with severe stenosis secondary atherosclerosis. Mixed calcific and noncalcific atherosclerosis at the carotid bifurcation with approximately 50% stenosis Vertebral arteries: Left dominant. Severe stenosis of the right vertebral artery origin. The right vertebral artery is diminutive and poorly opacified proximally and becomes non-opacified at approximately C2-C3. Moderate atherosclerotic narrowing of the proximal left vertebral artery. Skeleton: Severe degenerative disc disease at C4-C5 C5-C6. No evidence of acute fracture. Other neck: No mass or suspicious adenopathy. Upper chest: Partially imaged opacities in the right upper lobe, similar to prior CT chest. Review of the MIP images confirms the above findings CTA HEAD FINDINGS Anterior circulation: Bilateral petrous and cavernous internal carotid arteries are patent. Bilateral cavernous carotid calcific atherosclerosis without evidence of hemodynamically significant stenosis. Bilateral MCAs are patent without evidence hemodynamically significant proximal  stenosis or large vessel occlusion. Right dominant A1 ACA with hypoplastic or absent left A1 ACA. No evidence of hemodynamically significant stenosis or large vessel occlusion involving the ACAs. No aneurysm. Posterior circulation: Non-opacified right  intradural vertebral artery. The left intradural vertebral artery and basilar artery are patent. Bilateral PCAs are opacified proximally. Suspected severe stenosis of the left PCA at the P2/P3 junction with poor opacification of more distal PCA branches. Venous sinuses: As permitted by contrast timing, patent. Small left transverse and sigmoid sinus. Review of the MIP images confirms the above findings IMPRESSION: 1. Intracranially, there is suspected severe stenosis of the left PCA at the P2/P3 junction with poor opacification of more distal PCA branches. Otherwise, no evidence of large vessel occlusion or hemodynamically significant stenosis intracranially. 2.  Extensive atherosclerosis in the neck, including: - Severe stenosis of the left common carotid origin and approximately 50-60% stenosis of the right common carotid artery origin. - Approximately 50% stenosis of the proximal left cervical ICA. - Severe stenosis of the non dominant right vertebral artery origin with poor opacification of the proximal right vertebral artery and non opacification distally. - Moderate narrowing of the dominant left vertebral artery proximally. 3. Partially imaged bilateral pulmonary emboli and right upper lobe opacities, which were better characterized on recent CTA of the chest from August 29, 2020. Findings were communicated on 09/07/2020 at 10:33 am to provider Dr. Charna Archer Via telephone, who verbally acknowledged these results. Electronically Signed   By: Margaretha Sheffield MD   On: 09/07/2020 10:36   DG Chest 2 View  Result Date: 09/02/2020 CLINICAL DATA:  Tachycardia EXAM: CHEST - 2 VIEW COMPARISON:  None. FINDINGS: The heart size and mediastinal contours are within normal limits. Both lungs are clear. The visualized skeletal structures are unremarkable. Right chest wall Port-A-Cath with tip at the cavoatrial junction. IMPRESSION: No active cardiopulmonary disease. Electronically Signed   By: Ulyses Jarred M.D.   On:  09/02/2020 03:28   DG Chest 2 View  Result Date: 08/25/2020 CLINICAL DATA:  Shortness of breath. EXAM: CHEST - 2 VIEW COMPARISON:  01/23/2018 FINDINGS: The right IJ Port-A-Cath is in good position. Stable tortuosity, ectasia and calcification of the thoracic aorta. Stable surgical changes from triple bypass surgery. The lungs are clear of an acute process. No pleural effusions or pulmonary edema. The bony thorax is intact. IMPRESSION: No acute cardiopulmonary findings. Electronically Signed   By: Marijo Sanes M.D.   On: 08/25/2020 16:19   DG Abd 1 View  Result Date: 08/25/2020 CLINICAL DATA:  Abdominal pain. EXAM: ABDOMEN - 1 VIEW COMPARISON:  01/03/2014 FINDINGS: There is moderate stool throughout the colon and down into the rectum suggesting constipation. No distended small bowel loops are identified. No free air. The bony structures are intact. Lumbar fusion hardware noted. Vascular calcifications are noted. IMPRESSION: Moderate stool throughout the colon suggesting constipation. Electronically Signed   By: Marijo Sanes M.D.   On: 08/25/2020 16:21   CT Angio Neck W and/or Wo Contrast  Result Date: 09/07/2020 CLINICAL DATA:  Headache with some numbness on the right side. EXAM: CT ANGIOGRAPHY HEAD AND NECK TECHNIQUE: Multidetector CT imaging of the head and neck was performed using the standard protocol during bolus administration of intravenous contrast. Multiplanar CT image reconstructions and MIPs were obtained to evaluate the vascular anatomy. Carotid stenosis measurements (when applicable) are obtained utilizing NASCET criteria, using the distal internal carotid diameter as the denominator. CONTRAST:  33mL OMNIPAQUE IOHEXOL 350 MG/ML SOLN COMPARISON:  Same day  head CT. FINDINGS: CTA NECK FINDINGS Aortic arch: Extensive calcific and noncalcific atherosclerosis of the partially imaged aorta. Great vessel origins are patent. Right carotid system: Approximately 50-60% stenosis of the right common  carotid artery origin. Predominantly calcific atherosclerosis at the carotid bifurcation without greater than 50% narrowing. Left carotid system: The left common carotid artery arises directly from the aorta with severe stenosis secondary atherosclerosis. Mixed calcific and noncalcific atherosclerosis at the carotid bifurcation with approximately 50% stenosis Vertebral arteries: Left dominant. Severe stenosis of the right vertebral artery origin. The right vertebral artery is diminutive and poorly opacified proximally and becomes non-opacified at approximately C2-C3. Moderate atherosclerotic narrowing of the proximal left vertebral artery. Skeleton: Severe degenerative disc disease at C4-C5 C5-C6. No evidence of acute fracture. Other neck: No mass or suspicious adenopathy. Upper chest: Partially imaged opacities in the right upper lobe, similar to prior CT chest. Review of the MIP images confirms the above findings CTA HEAD FINDINGS Anterior circulation: Bilateral petrous and cavernous internal carotid arteries are patent. Bilateral cavernous carotid calcific atherosclerosis without evidence of hemodynamically significant stenosis. Bilateral MCAs are patent without evidence hemodynamically significant proximal stenosis or large vessel occlusion. Right dominant A1 ACA with hypoplastic or absent left A1 ACA. No evidence of hemodynamically significant stenosis or large vessel occlusion involving the ACAs. No aneurysm. Posterior circulation: Non-opacified right intradural vertebral artery. The left intradural vertebral artery and basilar artery are patent. Bilateral PCAs are opacified proximally. Suspected severe stenosis of the left PCA at the P2/P3 junction with poor opacification of more distal PCA branches. Venous sinuses: As permitted by contrast timing, patent. Small left transverse and sigmoid sinus. Review of the MIP images confirms the above findings IMPRESSION: 1. Intracranially, there is suspected severe  stenosis of the left PCA at the P2/P3 junction with poor opacification of more distal PCA branches. Otherwise, no evidence of large vessel occlusion or hemodynamically significant stenosis intracranially. 2.  Extensive atherosclerosis in the neck, including: - Severe stenosis of the left common carotid origin and approximately 50-60% stenosis of the right common carotid artery origin. - Approximately 50% stenosis of the proximal left cervical ICA. - Severe stenosis of the non dominant right vertebral artery origin with poor opacification of the proximal right vertebral artery and non opacification distally. - Moderate narrowing of the dominant left vertebral artery proximally. 3. Partially imaged bilateral pulmonary emboli and right upper lobe opacities, which were better characterized on recent CTA of the chest from August 29, 2020. Findings were communicated on 09/07/2020 at 10:33 am to provider Dr. Charna Archer Via telephone, who verbally acknowledged these results. Electronically Signed   By: Margaretha Sheffield MD   On: 09/07/2020 10:36   MR BRAIN WO CONTRAST  Addendum Date: 09/07/2020   ADDENDUM REPORT: 09/07/2020 14:42 ADDENDUM: Findings discussed with Dr. Alexis Goodell via telephone at 2:40 PM. Electronically Signed   By: Margaretha Sheffield MD   On: 09/07/2020 14:42   Result Date: 09/07/2020 EXAM: MRI HEAD WITHOUT CONTRAST TECHNIQUE: Multiplanar, multiecho pulse sequences of the brain and surrounding structures were obtained without intravenous contrast. COMPARISON:  Same day CT exams. FINDINGS: Brain: Acute left PCA territory infarct which involves the left occipital lobe, left hippocampus, and left thalamus. Mild associated edema without substantial mass effect. Additionally, there small infarcts in the left frontal lobe, left caudate, and right cerebellar hemisphere. No hydrocephalus. No mass lesion or suspect antral mass effect. No extra-axial fluid collection. Vascular: Further evaluated on same day  CTA Skull and upper cervical spine: Normal marrow  signal. Sinuses/Orbits: No acute finding. Other: No mastoid effusions. IMPRESSION: 1. Acute left PCA territory infarct which involves the left occipital lobe, left hippocampus, and left thalamus. Mild associated edema without substantial mass effect. 2. Additionally, there small infarcts in the left frontal lobe, left caudate, and right cerebellar hemisphere. Given involvement of multiple vascular territories, consider centrum embolic source. Electronically Signed: By: Margaretha Sheffield MD On: 09/07/2020 14:13   CT CHEST ABDOMEN PELVIS W CONTRAST  Result Date: 08/31/2020 CLINICAL DATA:  Metastatic pancreatic cancer. Shortness of breath. EXAM: CT CHEST, ABDOMEN, AND PELVIS WITH CONTRAST TECHNIQUE: Multidetector CT imaging of the chest, abdomen and pelvis was performed following the standard protocol during bolus administration of intravenous contrast. CONTRAST:  171mL OMNIPAQUE IOHEXOL 300 MG/ML  SOLN COMPARISON:  CT scan 06/04/2020 FINDINGS: CT CHEST FINDINGS Cardiovascular: The heart is normal in size. No pericardial effusion. Stable tortuosity, mild ectasia and calcification of the thoracic aorta. Stable branch vessel calcifications including three-vessel coronary artery calcifications. Stable surgical changes from bypass surgery. Mediastinum/Nodes: There are bilateral pulmonary emboli. No evidence of right heart strain. The RV LV ratio is less than 1. Small scattered mediastinal and hilar lymph nodes are stable. None of these measures more than 8 mm. Lungs/Pleura: Stable 7 mm irregular nodule at the left lung base. 3 mm subpleural pulmonary nodule along the major fissure on image 85/3 is slightly more pronounced but I think this is due to breathing motion artifact. Similar finding involving the 3 mm nodule along the right minor fissure. 5 mm subpleural nodule in the right upper lobe on image number 72/3 previously measured 3 mm. Subpleural atelectasis noted  in the right upper lobe posteriorly which may be related to the patient's pulmonary embolism. No pleural effusions. Musculoskeletal: No significant bony findings. No evidence of osseous metastatic disease. CT ABDOMEN PELVIS FINDINGS Hepatobiliary: Progressive hepatic metastatic disease. Enlarging/coalescing mass in segment 8 on image 53/2 measures 5.9 x 5.5 cm and previously measured approximately 4.4 x 4.2 cm. Segment 7 lesion on image 51/2 measures 6.5 x 4.7 cm and previously measured 4.6 x 3.6 cm. Segment 6 lesion on image 63/2 measures 5.5 x 4.5 cm and previously measured 4.4 x 3.4 cm. Pancreas: Pancreatic head mass measures 2.8 x 2.7 cm and previously measured 3.1 x 2.9 cm. Marked atrophy of the remainder of the gland is again noted. Spleen: Normal size. No focal lesions. Adrenals/Urinary Tract: The adrenal glands and kidneys are stable. Bilateral renal cysts are noted. The midpole left renal cyst posteriorly is partially hemorrhagic. Stomach/Bowel: The stomach, duodenum, small bowel and colon are grossly normal without oral contrast. No inflammatory changes, mass lesions or obstructive findings. The appendix is normal. Vascular/Lymphatic: Severe atherosclerotic calcifications involving the aorta, iliac arteries and branch vessels. The right internal iliac artery is occluded proximally. No mesenteric or retroperitoneal mass or adenopathy. Reproductive: The prostate gland and seminal vesicles are unremarkable. Other: No pelvic mass or adenopathy. No free pelvic fluid collections. No inguinal mass or adenopathy. No abdominal wall hernia or subcutaneous lesions. Musculoskeletal: No significant bony findings. Remote lumbar fusion changes. IMPRESSION: 1. Bilateral pulmonary emboli. No evidence of right heart strain. 2. Progressive hepatic metastatic disease. 3. Slight interval decrease in size of the pancreatic head mass. 4. Stable advanced atherosclerotic calcifications involving the thoracic and abdominal aorta  and branch vessels including the coronary arteries. 5. Stable pulmonary nodules. 6. Aortic atherosclerosis. These results will be called to the ordering clinician or representative by the Radiologist Assistant, and communication documented in the PACS  or [employer]. Aortic Atherosclerosis (ICD10-I70.0). Electronically Signed   By: Marijo Sanes M.D.   On: 08/31/2020 15:05   US Venous Img Lower Bilateral (DVT)  Result Date: 09/02/2020 CLINICAL DATA:  74 year old male with a history prior pulmonary embolism EXAM: BILATERAL LOWER EXTREMITY VENOUS DOPPLER ULTRASOUND TECHNIQUE: Gray-scale sonography with graded compression, as well as color Doppler and duplex ultrasound were performed to evaluate the lower extremity deep venous systems from the level of the common femoral vein and including the common femoral, femoral, profunda femoral, popliteal and calf veins including the posterior tibial, peroneal and gastrocnemius veins when visible. The superficial great saphenous vein was also interrogated. Spectral Doppler was utilized to evaluate flow at rest and with distal augmentation maneuvers in the common femoral, femoral and popliteal veins. COMPARISON:  None. FINDINGS: RIGHT LOWER EXTREMITY Common Femoral Vein: No evidence of thrombus. Normal compressibility, respiratory phasicity and response to augmentation. Saphenofemoral Junction: No evidence of thrombus. Normal compressibility and flow on color Doppler imaging. Profunda Femoral Vein: No evidence of thrombus. Normal compressibility and flow on color Doppler imaging. Femoral Vein: No evidence of thrombus. Normal compressibility, respiratory phasicity and response to augmentation. Popliteal Vein: Partially compressible popliteal vein without occlusion. Calf Veins: No evidence of thrombus. Normal compressibility and flow on color Doppler imaging. Superficial Great Saphenous Vein: No evidence of thrombus. Normal compressibility and flow on color Doppler  imaging. Other Findings:  None. LEFT LOWER EXTREMITY Common Femoral Vein: No evidence of thrombus. Normal compressibility, respiratory phasicity and response to augmentation. Saphenofemoral Junction: No evidence of thrombus. Normal compressibility and flow on color Doppler imaging. Profunda Femoral Vein: No evidence of thrombus. Normal compressibility and flow on color Doppler imaging. Femoral Vein: No evidence of thrombus. Normal compressibility, respiratory phasicity and response to augmentation. Popliteal Vein: No evidence of thrombus. Normal compressibility, respiratory phasicity and response to augmentation. Calf Veins: No evidence of thrombus. Normal compressibility and flow on color Doppler imaging. Superficial Great Saphenous Vein: No evidence of thrombus. Normal compressibility and flow on color Doppler imaging. Other Findings: Lentiform heterogeneously echoic soft tissue within the thigh measures 4.1 cm x 1.6 cm x 5.1 cm. No internal color flow. IMPRESSION: Sonographic survey of the right lower extremity positive for nonocclusive DVT of the popliteal vein. Sonographic survey of the left lower extremity negative for DVT. Lentiform soft tissue lesion in the left thigh measures 5.1 cm. Ultrasound characteristics are nonspecific, and this may represent a lipoma. Note that low-grade liposarcoma cannot be excluded on ultrasound. Correlation with patient presentation may be useful, as well as consideration of MRI if indicated. Electronically Signed   By: Corrie Mckusick D.O.   On: 09/02/2020 11:51   ECHOCARDIOGRAM COMPLETE  Result Date: 09/03/2020    ECHOCARDIOGRAM REPORT   Patient Name:   Dalessandro LAUDERBACK Date of Exam: 09/02/2020 Medical Rec #:  [id number]       Height:       69.0 in Accession #:    [id number]      Weight:       160.0 lb Date of Birth:  1945-10-17       BSA:          1.879 m Patient Age:    74 years        BP:           165/84 mmHg Patient Gender: M               HR:           85 bpm.  Exam  Location:  [facility] Procedure: 2D Echo, Color Doppler and Cardiac Doppler Indications:     I26.99 Pulmonary Embolus  History:         Patient has prior history of Echocardiogram examinations. CHF,                  CAD, Prior CABG; Risk Factors:Hypertension and Dyslipidemia.  Sonographer:     Charmayne Sheer RDCS (AE) Referring Phys:  [id number] Ivor Costa Diagnosing Phys: Yolonda Kida MD  Sonographer Comments: Suboptimal subcostal window. IMPRESSIONS  1. Left ventricular ejection fraction, by estimation, is 55 to 60%. The left ventricle has normal function. The left ventricle has no regional wall motion abnormalities. Left ventricular diastolic parameters are consistent with Grade I diastolic dysfunction (impaired relaxation).  2. Right ventricular systolic function is normal. The right ventricular size is normal.  3. The mitral valve is normal in structure. No evidence of mitral valve regurgitation.  4. The aortic valve is grossly normal. There is mild calcification of the aortic valve. There is mild thickening of the aortic valve. Aortic valve regurgitation is not visualized. Mild to moderate aortic valve sclerosis/calcification is present, without  any evidence of aortic stenosis. FINDINGS  Left Ventricle: Left ventricular ejection fraction, by estimation, is 55 to 60%. The left ventricle has normal function. The left ventricle has no regional wall motion abnormalities. The left ventricular internal cavity size was normal in size. There is  no left ventricular hypertrophy. Left ventricular diastolic parameters are consistent with Grade I diastolic dysfunction (impaired relaxation). Right Ventricle: The right ventricular size is normal. No increase in right ventricular wall thickness. Right ventricular systolic function is normal. Left Atrium: Left atrial size was normal in size. Right Atrium: Right atrial size was normal in size. Pericardium: There is no evidence of pericardial effusion. Mitral Valve: The mitral valve is  normal in structure. No evidence of mitral valve regurgitation. MV peak gradient, 5.0 mmHg. The mean mitral valve gradient is 2.0 mmHg. Tricuspid Valve: The tricuspid valve is normal in structure. Tricuspid valve regurgitation is not demonstrated. Aortic Valve: The aortic valve is grossly normal. There is mild calcification of the aortic valve. There is mild thickening of the aortic valve. There is mild aortic valve annular calcification. Aortic valve regurgitation is not visualized. Mild to moderate aortic valve sclerosis/calcification is present, without any evidence of aortic stenosis. Aortic valve mean gradient measures 3.0 mmHg. Aortic valve peak gradient measures 5.3 mmHg. Aortic valve area, by VTI measures 1.71 cm. Pulmonic Valve: The pulmonic valve was grossly normal. Pulmonic valve regurgitation is not visualized. Aorta: The ascending aorta was not well visualized. IAS/Shunts: No atrial level shunt detected by color flow Doppler.  LEFT VENTRICLE PLAX 2D LVIDd:         4.06 cm  Diastology LVIDs:         2.85 cm  LV e' medial:    5.33 cm/s LV PW:         0.97 cm  LV E/e' medial:  14.0 LV IVS:        0.78 cm  LV e' lateral:   8.05 cm/s LVOT diam:     2.00 cm  LV E/e' lateral: 9.3 LV SV:         33 LV SV Index:   17 LVOT Area:     3.14 cm  RIGHT VENTRICLE RV Basal diam:  2.50 cm RV Mid diam:    3.64 cm LEFT ATRIUM  Index       RIGHT ATRIUM          Index LA diam:      2.90 cm 1.54 cm/m  RA Area:     8.63 cm LA Vol (A2C): 26.3 ml 14.00 ml/m RA Volume:   13.40 ml 7.13 ml/m LA Vol (A4C): 24.5 ml 13.04 ml/m  AORTIC VALVE                   PULMONIC VALVE AV Area (Vmax):    1.87 cm    PV Vmax:       0.67 m/s AV Area (Vmean):   1.89 cm    PV Vmean:      50.000 cm/s AV Area (VTI):     1.71 cm    PV VTI:        0.118 m AV Vmax:           115.00 cm/s PV Peak grad:  1.8 mmHg AV Vmean:          76.500 cm/s PV Mean grad:  1.0 mmHg AV VTI:            0.191 m AV Peak Grad:      5.3 mmHg AV Mean Grad:       3.0 mmHg LVOT Vmax:         68.50 cm/s LVOT Vmean:        46.100 cm/s LVOT VTI:          0.104 m LVOT/AV VTI ratio: 0.54  AORTA Ao Root diam: 3.10 cm MITRAL VALVE MV Area (PHT): 5.20 cm     SHUNTS MV Peak grad:  5.0 mmHg     Systemic VTI:  0.10 m MV Mean grad:  2.0 mmHg     Systemic Diam: 2.00 cm MV Vmax:       1.12 m/s MV Vmean:      59.6 cm/s MV Decel Time: 146 msec MV E velocity: 74.60 cm/s MV A velocity: 110.00 cm/s MV E/A ratio:  0.68 Dwayne D Callwood MD Electronically signed by Yolonda Kida MD Signature Date/Time: 09/03/2020/4:31:23 PM    Final    CT HEAD CODE STROKE WO CONTRAST  Result Date: 09/07/2020 CLINICAL DATA:  Code stroke. Neuro deficit, acute stroke suspected. Right-sided weakness and headache. EXAM: CT HEAD WITHOUT CONTRAST TECHNIQUE: Contiguous axial images were obtained from the base of the skull through the vertex without intravenous contrast. COMPARISON:  None. FINDINGS: Brain: No evidence of acute large vascular territory infarction, hemorrhage, hydrocephalus, extra-axial collection or mass lesion/mass effect. Patchy white matter hypoattenuation, nonspecific but most likely secondary to chronic microvascular ischemic disease. Vascular: Intracranial calcific atherosclerosis. No hyperdense vessel identified. Skull: No acute fracture. Sinuses/Orbits: No acute findings. Other: No mastoid effusions. ASPECTS [facility] Stroke Program Early CT Score) total score (0-10 with 10 being normal): 10. IMPRESSION: No evidence of acute intracranial abnormality.  ASPECTS is 10. Code stroke imaging results were communicated on 09/07/2020 at 9:54 am to provider Dr. Charna Archer via telephone, who verbally acknowledged these results. Electronically Signed   By: Margaretha Sheffield MD   On: 09/07/2020 09:57    Assessment and plan- Patient is a 74 y.o. male history of stage IV pancreatic cancer who has progressed through multiple lines of chemotherapy, currently admitted due to right-sided weakness.  Patient  was admitted for stroke work-up.  #Acute left PCA stroke I reviewed neurology recommendation.  I completely agree that even though T EE can be obtained to further assess cardiac  source of embolism, given the fact that he is already on anticoagulation for PE, and limited life expectancy less than 6 months due to stage IV pancreatic cancer, procedure risk for TEE likely overweighs benefits Continue Eliquis and aspirin. Outpatient neurology follow-up.  #Stage IV pancreatic cancer Had a discussion with him about his recent CT scan results. Patient had progressive hepatic metastasis disease.  Slight interval decrease in size of pancreatic head mass. Patient has progressed after 4 lines of treatments. Goals of care was discussed.  Patient understands that his disease not curable and he may not respond to additional chemotherapy.  He prefers to try additional therapy as well as he is able to maintain current performance status. If his performance status further deteriorates, he will consider hospice/comfort care. I will see him outpatient for further evaluation.    Thank you for allowing me to participate in the care of this patient.   Earlie Server, MD, PhD Hematology Oncology [facility] Pager- [phone] 09/08/2020

## 2020-09-08 NOTE — Telephone Encounter (Signed)
Oral Oncology Patient Advocate Encounter  I spoke with Craig [name] on 09/02/20 to set up delivery of Xeloda.  Address verified for shipment.  Xeloda will be filled through [facility] and mailed 09/02/20 for delivery 09/03/20.    Patient aware to not start medication when received, but to bring to his appointment.  [name] will call 7-10 days before next refill is due to complete adherence call and set up delivery of medication.     [facility] Phone [phone] Fax [phone] 09/08/2020 9:09 AM

## 2020-09-08 NOTE — Plan of Care (Signed)
  Problem: Education: Goal: Knowledge of disease or condition will improve 09/08/2020 0845 by Cristela Blue, RN Outcome: Progressing 09/08/2020 0845 by Cristela Blue, RN Outcome: Progressing Goal: Knowledge of secondary prevention will improve 09/08/2020 0845 by Cristela Blue, RN Outcome: Progressing 09/08/2020 0845 by Cristela Blue, RN Outcome: Progressing Goal: Knowledge of patient specific risk factors addressed and post discharge goals established will improve 09/08/2020 0845 by Cristela Blue, RN Outcome: Progressing 09/08/2020 0845 by Cristela Blue, RN Outcome: Progressing Goal: Individualized Educational Video(s) 09/08/2020 0845 by Cristela Blue, RN Outcome: Progressing 09/08/2020 0845 by Cristela Blue, RN Outcome: Progressing   Problem: Coping: Goal: Will verbalize positive feelings about self 09/08/2020 0845 by Cristela Blue, RN Outcome: Progressing 09/08/2020 0845 by Cristela Blue, RN Outcome: Progressing Goal: Will identify appropriate support needs 09/08/2020 0845 by Cristela Blue, RN Outcome: Progressing 09/08/2020 0845 by Cristela Blue, RN Outcome: Progressing   Problem: Health Behavior/Discharge Planning: Goal: Ability to manage health-related needs will improve 09/08/2020 0845 by Cristela Blue, RN Outcome: Progressing 09/08/2020 0845 by Cristela Blue, RN Outcome: Progressing   Problem: Self-Care: Goal: Ability to participate in self-care as condition permits will improve 09/08/2020 0845 by Cristela Blue, RN Outcome: Progressing 09/08/2020 0845 by Cristela Blue, RN Outcome: Progressing

## 2020-09-08 NOTE — Care Management CC44 (Addendum)
Condition Code 44 Documentation Completed  Patient Details  Name: Craig [name] MRN: [id number] Date of Birth: 1945-10-17   Condition Code 44 given: yes   Patient signature on Condition Code 44 notice: discussed with [name] on phone and pt states understanding   Documentation of 2 MD's agreement: yes   Code 44 added to claim: yes      Dellie Catholic, RN 09/08/2020, 5:42 PM

## 2020-09-08 NOTE — TOC Initial Note (Addendum)
Transition of Care [facility]) - Initial/Assessment Note    Patient Details  Name: Craig [name] MRN: [id number] Date of Birth: Mar 09, 1946  Transition of Care [name]) CM/SW Contact:    Magnus Ivan, LCSW Phone Number: 09/08/2020, 11:05 AM  Clinical Narrative:          CSW spoke with patient to complete Readmission Screening. Patient lives at home with his spouse. Patient drives himself to appointments. PCP is [facility]. Patient is also followed [facility] [place]. No DME or SNF history. Patient reported he had a HHRN in the past, but was unsure which agency they were with. CSW will continue to follow for needs. Patient denied having needs at this time.        11:10- CSW updated by OT that recommendations are HHPT and HHOT. CSW spoke to patient who is agreeable and declined having an agency preference. CSW reaching out to all preferred [facility] agencies for coverage.    1:40- CSW informed by OT and ST that patient should not continue to drive, informed patient, he reported he has family/friends who can take him to appointments. Informed him of PT recommendation for cane, patient is agreeable to a cane being ordered for him. CSW made referral to [place]. Informed patient that so far no agency has staffing for [place], discussed Outpatient PT & OT, patient said he has a headache and wants to think about it. CSW will continue to follow.   Expected Discharge Plan: Home/Self Care Barriers to Discharge: Continued Medical Work up   Patient Goals and CMS Choice Patient states their goals for this hospitalization and ongoing recovery are:: to return home with wife CMS Medicare.gov Compare Post Acute Care list provided to:: Patient Choice offered to / list presented to : Patient  Expected Discharge Plan and Services Expected Discharge Plan: Home/Self Care       Living arrangements for the past 2 months: Single Family Home                                       Prior Living Arrangements/Services Living arrangements for the past 2 months: Single Family Home Lives with:: Spouse Patient language and need for interpreter reviewed:: Yes Do you feel safe going back to the place where you live?: Yes      Need for Family Participation in Patient Care: Yes (Comment) Care giver support system in place?: Yes (comment)   Criminal Activity/Legal Involvement Pertinent to Current Situation/Hospitalization: No - Comment as needed  Activities of Daily Living      Permission Sought/Granted Permission sought to share information with : Chartered certified accountant granted to share information with : Yes, Verbal Permission Granted     Permission granted to share info w AGENCY: [place], DME agencies        Emotional Assessment       Orientation: : Oriented to Situation, Oriented to  Time, Oriented to Place, Oriented to Self Alcohol / Substance Use: Not Applicable Psych Involvement: No (comment)  Admission diagnosis:  Right leg weakness [R29.898] Acute CVA (cerebrovascular accident) ([place]) [I63.9] Cerebrovascular accident (CVA), unspecified mechanism ([place]) [I63.9] Patient Active Problem List   Diagnosis Date Noted  . Acute CVA (cerebrovascular accident) ([place]) 09/07/2020  . Pulmonary embolism ([place]) 09/02/2020  . Diabetes mellitus without complication ([place]) 88/91/6945  . Depression 09/02/2020  . Chronic systolic CHF (congestive  heart failure) ([place]) 09/02/2020  . Genetic testing 09/01/2020  . Flatulence 08/25/2020  . Constipation 08/25/2020  . RUQ abdominal pain 08/25/2020  . Family history of brain cancer   . Family history of cancer   . Neuropathy 06/18/2020  . Hypokalemia 03/17/2020  . 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 Malnutrition of moderate degree 01/26/2020  . DKA (diabetic ketoacidoses) 01/24/2020  . Fatigue 10/03/2019  . Low-level of literacy 06/29/2019  . Abnormal LFTs 06/29/2019  . AKI  (acute kidney injury) ([place]) 06/29/2019  . Diarrhea 06/29/2019  . Orthostatic hypotension 06/29/2019  . Hyperglycemia 06/29/2019  . Anemia due to antineoplastic chemotherapy 06/29/2019  . Dehydration 06/21/2019  . Encounter for antineoplastic chemotherapy 06/21/2019  . Normocytic anemia 06/21/2019  . Port-A-Cath in place 06/07/2019  . Goals of care, counseling/discussion 06/01/2019  . Pancreatic cancer metastasized to liver ([place]) 06/01/2019  . Liver metastasis ([place]) 06/01/2019  . CAD (coronary artery disease) 05/17/2019  . Personal history of tobacco use, presenting hazards to health 05/10/2019  . Peripheral arterial disease ([place]) 05/09/2019  . Prediabetes 05/01/2019  . LVH (left ventricular hypertrophy) due to hypertensive disease, without heart failure 01/04/2018  . 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 Postoperative anemia   . Coronary artery disease involving native coronary artery of native heart   . Coronary artery disease involving native coronary artery of native heart 09/16/2017  . Chronic shortness of breath 08/11/2017  . Tobacco abuse 08/11/2017  . Spondylolisthesis, lumbar region 03/16/2017  . HLD (hyperlipidemia) 09/10/2015  . 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 Essential (primary) hypertension 09/10/2015  . Degeneration of cervical intervertebral disc 09/10/2015  . Allergic rhinitis 09/10/2015  . GERD (gastroesophageal reflux disease) 06/25/2015  . Cervical stenosis of spinal canal 06/25/2015  . Atherosclerosis of native arteries of extremity with intermittent claudication ([place]) 06/25/2015   PCP:  Trinna Post, PA-C [facility]   [id number] [place] [place] [address] [place] [address] Phone: [phone] Fax: [place], [address], [address] [place], [place] [address] Phone: [phone] Fax: [place],  [place] [address] Phone: [phone] Fax: [phone]     Social Determinants of Health ([place]) Interventions    Readmission Risk Interventions Readmission Risk Prevention Plan 09/08/2020  Transportation Screening Complete  PCP or Specialist Appt within 3-5 Days Complete  HRI or [place] Complete  Social Work Consult for [place] Planning/Counseling Complete  Palliative Care Screening Complete  Medication Review Press photographer) Complete  Some recent data might be hidden

## 2020-09-08 NOTE — Evaluation (Signed)
Occupational Therapy Evaluation Patient Details Name: Craig [name] MRN: [id number] DOB: 10-08-1946 Today's Date: 09/08/2020    History of Present Illness Pt is a 74 y.o. 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  Initial CT scan of the brain was negative for bleed and CT angiogram does not show any large vessel occlusion but shows severe atherosclerosis in the neck. Pt recently discharged from hospital 11/25 with diagnosis of PE. MRI+ acute CVA affecting L occipital, L hippocampus, L thalamus, andother in L frontal, caudate, and cerebellum.   Clinical Impression   Pt seen for OT evaluation this date. Pt lives with his spouse (who is currently caring for a daughter after heart surgery and only available PRN). Pt independent prior to admission. Pt was independent with mobility, ADL, and IADL prior to admission with no falls reported in past 12 months. Pt currently presents with R visual field deficits (approx 50% vision loss on R side in B eyes; per [facility]) which can impact his functional mobility, safety, and ability to perform ADL and IADL tasks at [facility]. Pt required CGA for safety ambulating in room and cues for attending to R side to ensure safety. Pt educated in symptoms and compensatory strategies for homonymous hemianopsia including visual tracking, visual scanning exercises, safety awareness, falls prevention, navigating the environment, strategies for reading, and community mobility. Pt verbalized understanding. Pt also demonstrates impairments in R side strength and coordination. DO NOT recommend pt return to driving at this time given severity of visual deficits. Recommend [place]  to maximize pt's return to PLOF and minimize risk of falls, increased caregiver burden. Case mgr notified.    Follow Up Recommendations  Home health OT;Supervision - Intermittent    Equipment Recommendations  None recommended by OT    Recommendations for Other Services       Precautions / Restrictions Precautions Precautions: Fall Restrictions Weight Bearing Restrictions: No      Mobility Bed Mobility Overal bed mobility: Needs Assistance Bed Mobility: Sit to Supine     Supine to sit: Supervision;HOB elevated Sit to supine: Supervision        Transfers Overall transfer level: Needs assistance Equipment used: None Transfers: Sit to/from Stand Sit to Stand: Min guard         General transfer comment: no AD, no LOB noted    Balance Overall balance assessment: Needs assistance Sitting-balance support: Feet supported Sitting balance-Leahy Scale: Good     Standing balance support: No upper extremity supported Standing balance-Leahy Scale: Fair Standing balance comment: pt able to ambulate slowly, carefully in room, no LOB noted but pt did catch R foot on recliner twice but able to correct                           ADL either performed or assessed with clinical judgement   ADL Overall ADL's : Needs assistance/impaired                                       General ADL Comments: Supervision to [employer] for ADL involving STS transfers for safety 2/2 R visual field deficits     Vision  Baseline Vision/History: No visual deficits Patient Visual Report: Blurring of vision Vision Assessment?: Yes Eye Alignment: Within Functional Limits Ocular Range of Motion: Within Functional Limits Alignment/Gaze Preference: Within Defined Limits Visual Fields: Right homonymous hemianopsia     Perception     Praxis      Pertinent Vitals/Pain Pain Assessment: 0-10 Pain Score: 5  Pain Location: headache Pain Descriptors / Indicators: Headache Pain  Intervention(s): Limited activity within patient's tolerance;Monitored during session;Repositioned     Hand Dominance Right   Extremity/Trunk Assessment Upper Extremity Assessment Upper Extremity Assessment: RUE deficits/detail;LUE deficits/detail RUE Deficits / Details: grossly 4/5 in R shoulder, 4+/5 elbow/grip, impaired finger to nose, thumb opposition testing RUE Sensation: decreased light touch RUE Coordination: decreased gross motor;decreased fine motor LUE Deficits / Details: grossly 4+/5 LUE Sensation: WNL LUE Coordination: WNL   Lower Extremity Assessment Lower Extremity Assessment: RLE deficits/detail;LLE deficits/detail RLE Deficits / Details: hip flexion 3+/5, knee extension; initially had difficulty with TKE, able to extend fully with extra time/effort 4/5. knee flexion 4/5, ankle DF/PF 4/5 RLE Sensation: decreased light touch RLE Coordination: decreased gross motor LLE Deficits / Details: 5/5 LLE Sensation: WNL LLE Coordination: WNL   Cervical / Trunk Assessment Cervical / Trunk Assessment: Normal   Communication Communication Communication: No difficulties   Cognition Arousal/Alertness: Awake/alert Behavior During Therapy: WFL for tasks assessed/performed Overall Cognitive Status: Within Functional Limits for tasks assessed                                 General Comments: PRN VC to attend to R side   General Comments       Exercises Other Exercises Other Exercises: Pt educated in falls prevention strategies and visual strategies to improve safety/awareness of R side   Shoulder Instructions      Home Living Family/patient expects to be discharged to:: Private residence Living Arrangements: Spouse/significant other Available Help at Discharge: Family;Available 24 hours/day Type of Home: House Home Access: Stairs to enter [employer] of Steps: 3 Entrance Stairs-Rails: Right Home Layout: One level     Bathroom Shower/Tub:  Occupational psychologist: Handicapped height     Home Equipment: None          Prior Functioning/Environment Level of Independence: Independent        Comments: pt independent, drives        OT Problem List: Decreased coordination;Impaired sensation;Impaired vision/perception;Decreased knowledge of precautions;Impaired UE functional use;Decreased knowledge of use of DME or AE;Impaired balance (sitting and/or standing)      OT Treatment/Interventions: Self-care/ADL training;Therapeutic exercise;Therapeutic activities;Neuromuscular education;Visual/perceptual remediation/compensation;DME and/or AE instruction;Patient/family education;Balance training    OT Goals(Current goals can be found in the care plan section) Acute Rehab OT Goals Patient Stated Goal: to return to normal OT Goal Formulation: With patient Time For Goal Achievement: 09/22/20 Potential to Achieve Goals: Good ADL Goals Pt Will Transfer to Toilet: with modified independence;ambulating (LRAD for mobility as needed) Additional ADL Goal #1: Pt will perform STS ADL tasks incorporating learned [facility] visual strategies requiring PRN VC to incorporate to maximize safety/indep and attention to R side.  OT Frequency: Min 2X/week   Barriers to D/C:            Co-evaluation              AM-PAC OT "6 Clicks" Daily Activity     Outcome Measure Help from another person eating meals?: None Help from another person taking  care of personal grooming?: None Help from another person toileting, which includes using toliet, bedpan, or urinal?: A Little Help from another person bathing (including washing, rinsing, drying)?: A Little Help from another person to put on and taking off regular upper body clothing?: None Help from another person to put on and taking off regular lower body clothing?: A Little 6 Click Score: 21   End of Session Nurse Communication: Mobility status  Activity Tolerance: Patient tolerated  treatment well Patient left: in bed;with call bell/phone within reach;with bed alarm set  OT Visit Diagnosis: Other abnormalities of gait and mobility (R26.89);Hemiplegia and hemiparesis;Low vision, both eyes (H54.2) Hemiplegia - Right/Left: Right Hemiplegia - dominant/non-dominant: Dominant Hemiplegia - caused by: Cerebral infarction                Time: 1039-1100 OT Time Calculation (min): 21 min Charges:  OT General Charges $OT Visit: 1 Visit OT Evaluation $OT Eval Moderate Complexity: 1 Mod OT Treatments $Self Care/Home Management : 8-22 mins  Jeni Salles, MPH, MS, OTR/L ascom [phone] 09/08/20, 1:19 PM

## 2020-09-08 NOTE — Progress Notes (Signed)
Subjective: No complaints.   Objective: Current vital signs: BP 109/62 (BP Location: Right Arm)   Pulse 96   Temp 98.3 F (36.8 C)   Resp 18   Ht 5\' 6"  (1.676 m)   Wt 69.4 kg   SpO2 100%   BMI 24.69 kg/m  Vital signs in last 24 hours: Temp:  [97.7 F (36.5 C)-98.4 F (36.9 C)] 98.3 F (36.8 C) (11/29 1200) Pulse Rate:  [81-109] 96 (11/29 1200) Resp:  [16-22] 18 (11/29 1200) BP: (109-175)/(62-98) 109/62 (11/29 1200) SpO2:  [99 %-100 %] 100 % (11/29 1200)  Intake/Output from previous day: 11/28 0701 - 11/29 0700 In: 240 [P.O.:240] Out: -  Intake/Output this shift: No intake/output data recorded. Nutritional status:  Diet Order            Diet 2 gram sodium Room service appropriate? Yes; Fluid consistency: Thin  Diet effective now                HEENT: [place]/AT Lungs: Respirations unlabored  Mental Status: Alert and oriented. No dysarthria. Speech is sparse but fluent.  Able to follow all commands without difficulty. Cranial Nerves: II: Tracks and fixates normally.  III,IV, VI: EOMI.  VII: Face is symmetric VIII: hearing intact to voice IX,X: No hypophonia XI: No asymmetry noted XII: No lingual dysarthria.  Motor: 5/5 BUE and BLE without asymmetry Sensory: Pinprick and light touch decreased in the RLE Deep Tendon Reflexes: Symmetric throughout Cerebellar: Normal FNF bilaterally Gait: Deferred  Lab Results: Results for orders placed or performed during the hospital encounter of 09/07/20 (from the past 48 hour(s))  CBG monitoring, ED     Status: Abnormal   Collection Time: 09/07/20 10:01 AM  Result Value Ref Range   Glucose-Capillary 109 (H) 70 - 99 mg/dL    Comment: Glucose reference range applies only to samples taken after fasting for at least 8 hours.  Ethanol     Status: None   Collection Time: 09/07/20 10:04 AM  Result Value Ref Range   Alcohol, Ethyl (B) <10 <10 mg/dL    Comment: (NOTE) Lowest detectable limit for serum alcohol is 10  mg/dL.  For medical purposes only. Performed at [facility], [address] [place], [place] [address]   Protime-INR     Status: Abnormal   Collection Time: 09/07/20 10:04 AM  Result Value Ref Range   Prothrombin Time 17.7 (H) 11.4 - 15.2 seconds   INR 1.5 (H) 0.8 - 1.2    Comment: (NOTE) INR goal varies based on device and disease states. Performed at [facility], [address] [place], [place] [address]   APTT     Status: None   Collection Time: 09/07/20 10:04 AM  Result Value Ref Range   aPTT 33 24 - 36 seconds    Comment: Performed at [facility], [address] [place], [place] [address]  Urine Drug Screen, Qualitative     Status: None   Collection Time: 09/07/20 10:04 AM  Result Value Ref Range   Tricyclic, Ur Screen NO REAGENT AVAILABLE / JAG NONE DETECTED   Amphetamines, Ur Screen NONE DETECTED NONE DETECTED   MDMA (Ecstasy)Ur Screen NONE DETECTED NONE DETECTED   Cocaine Metabolite,Ur [place] NONE DETECTED NONE DETECTED   Opiate, Ur Screen NONE DETECTED NONE DETECTED   Phencyclidine (PCP) Ur S NONE DETECTED NONE DETECTED   Cannabinoid 50 Ng, Ur [place] NONE DETECTED NONE DETECTED   Barbiturates, Ur Screen NONE DETECTED NONE DETECTED   Benzodiazepine, Ur  Scrn NONE DETECTED NONE DETECTED   Methadone Scn, Ur NONE DETECTED NONE DETECTED    Comment: (NOTE) Tricyclics + metabolites, urine    Cutoff 1000 ng/mL Amphetamines + metabolites, urine  Cutoff 1000 ng/mL MDMA (Ecstasy), urine              Cutoff 500 ng/mL Cocaine Metabolite, urine          Cutoff 300 ng/mL Opiate + metabolites, urine        Cutoff 300 ng/mL Phencyclidine (PCP), urine         Cutoff 25 ng/mL Cannabinoid, urine                 Cutoff 50 ng/mL Barbiturates + metabolites, urine  Cutoff 200 ng/mL Benzodiazepine, urine              Cutoff 200 ng/mL Methadone, urine                   Cutoff 300 ng/mL  The urine drug screen provides only a preliminary, unconfirmed analytical  test result and should not be used for non-medical purposes. Clinical consideration and professional judgment should be applied to any positive drug screen result due to possible interfering substances. A more specific alternate chemical method must be used in order to obtain a confirmed analytical result. Gas chromatography / mass spectrometry (GC/MS) is the preferred confirm atory method. Performed at [facility], [address] [place], [place] [address]   Urinalysis, Routine w reflex microscopic     Status: Abnormal   Collection Time: 09/07/20 10:04 AM  Result Value Ref Range   Color, Urine STRAW (A) YELLOW   APPearance CLEAR (A) CLEAR   Specific Gravity, Urine 1.020 1.005 - 1.030   pH 7.0 5.0 - 8.0   Glucose, UA NEGATIVE NEGATIVE mg/dL   Hgb urine dipstick NEGATIVE NEGATIVE   Bilirubin Urine NEGATIVE NEGATIVE   Ketones, ur NEGATIVE NEGATIVE mg/dL   Protein, ur NEGATIVE NEGATIVE mg/dL   Nitrite NEGATIVE NEGATIVE   Leukocytes,Ua NEGATIVE NEGATIVE    Comment: Performed at [facility], [address] [place], [place] [address]  CBC WITH DIFFERENTIAL     Status: Abnormal   Collection Time: 09/07/20 10:04 AM  Result Value Ref Range   WBC 5.7 4.0 - 10.5 K/uL   RBC 2.91 (L) 4.22 - 5.81 MIL/uL   Hemoglobin 8.5 (L) 13.0 - 17.0 g/dL   HCT 27.1 (L) 39 - 52 %   MCV 93.1 80.0 - 100.0 fL   MCH 29.2 26.0 - 34.0 pg   MCHC 31.4 30.0 - 36.0 g/dL   RDW 17.8 (H) 11.5 - 15.5 %   Platelets 222 150 - 400 K/uL   nRBC 0.0 0.0 - 0.2 %   Neutrophils Relative % 60 %   Neutro Abs 3.5 1.7 - 7.7 K/uL   Lymphocytes Relative 13 %   Lymphs Abs 0.7 0.7 - 4.0 K/uL   Monocytes Relative 22 %   Monocytes Absolute 1.2 (H) 0.1 - 1.0 K/uL   Eosinophils Relative 3 %   Eosinophils Absolute 0.2 0.0 - 0.5 K/uL   Basophils Relative 1 %   Basophils Absolute 0.0 0.0 - 0.1 K/uL   Immature Granulocytes 1 %   Abs Immature Granulocytes 0.05 0.00 - 0.07 K/uL    Comment: Performed at [facility], [address] [place], [place] [address]  Comprehensive metabolic panel     Status: Abnormal   Collection Time: 09/07/20 10:04 AM  Result  Value Ref Range   Sodium 136 135 - 145 mmol/L   Potassium 3.2 (L) 3.5 - 5.1 mmol/L   Chloride 103 98 - 111 mmol/L   CO2 22 22 - 32 mmol/L   Glucose, Bld 118 (H) 70 - 99 mg/dL    Comment: Glucose reference range applies only to samples taken after fasting for at least 8 hours.   BUN 10 8 - 23 mg/dL   Creatinine, Ser 0.69 0.61 - 1.24 mg/dL   Calcium 8.6 (L) 8.9 - 10.3 mg/dL   Total Protein 6.0 (L) 6.5 - 8.1 g/dL   Albumin 2.7 (L) 3.5 - 5.0 g/dL   AST 38 15 - 41 U/L   ALT 19 0 - 44 U/L   Alkaline Phosphatase 160 (H) 38 - 126 U/L   Total Bilirubin 0.9 0.3 - 1.2 mg/dL   GFR, Estimated >60 >60 mL/min    Comment: (NOTE) Calculated using the CKD-EPI Creatinine Equation (2021)    Anion gap 11 5 - 15    Comment: Performed at [facility], [address] [place], [place] [address]  Resp Panel by RT-PCR (Flu A&B, Covid)     Status: None   Collection Time: 09/07/20 11:45 AM   Specimen: Nasopharyngeal(NP) swabs in vial transport medium  Result Value Ref Range   SARS Coronavirus 2 by RT PCR NEGATIVE NEGATIVE    Comment: (NOTE) SARS-CoV-2 target nucleic acids are NOT DETECTED.  The SARS-CoV-2 RNA is generally detectable in upper respiratory specimens during the acute phase of infection. The lowest concentration of SARS-CoV-2 viral copies this assay can detect is 138 copies/mL. A negative result does not preclude SARS-Cov-2 infection and should not be used as the sole basis for treatment or other patient management decisions. A negative result may occur with  improper specimen collection/handling, submission of specimen other than nasopharyngeal swab, presence of viral mutation(s) within the areas targeted by this assay, and inadequate number of viral copies(<138 copies/mL). A negative result must be combined with clinical  observations, patient history, and epidemiological information. The expected result is Negative.  Fact Sheet for Patients:  EntrepreneurPulse.com.au  Fact Sheet for Healthcare Providers:  IncredibleEmployment.be  This test is no t yet approved or cleared by the [place] FDA and  has been authorized for detection and/or diagnosis of SARS-CoV-2 by FDA under an Emergency Use Authorization (EUA). This EUA will remain  in effect (meaning this test can be used) for the duration of the COVID-19 declaration under Section 564(b)(1) of the Act, 21 U.S.C.section 360bbb-3(b)(1), unless the authorization is terminated  or revoked sooner.       Influenza A by PCR NEGATIVE NEGATIVE   Influenza B by PCR NEGATIVE NEGATIVE    Comment: (NOTE) The Xpert Xpress SARS-CoV-2/FLU/RSV plus assay is intended as an aid in the diagnosis of influenza from Nasopharyngeal swab specimens and should not be used as a sole basis for treatment. Nasal washings and aspirates are unacceptable for Xpert Xpress SARS-CoV-2/FLU/RSV testing.  Fact Sheet for Patients: EntrepreneurPulse.com.au  Fact Sheet for Healthcare Providers: IncredibleEmployment.be  This test is not yet approved or cleared by the [place] FDA and has been authorized for detection and/or diagnosis of SARS-CoV-2 by FDA under an Emergency Use Authorization (EUA). This EUA will remain in effect (meaning this test can be used) for the duration of the COVID-19 declaration under Section 564(b)(1) of the Act, 21 U.S.C. section 360bbb-3(b)(1), unless the authorization is terminated or revoked.  Performed at [facility], [address] [place], [place] [address]   Hemoglobin A1c     Status: None   Collection Time: 09/07/20 11:45 AM  Result Value Ref Range   Hgb A1c MFr Bld 5.4 4.8 - 5.6 %    Comment: (NOTE) Pre diabetes:          5.7%-6.4%  Diabetes:               >6.4%  Glycemic control for   <7.0% adults with diabetes    Mean Plasma Glucose 108.28 mg/dL    Comment: Performed at [facility] [address], [place], [place] [address]  CBG monitoring, ED     Status: Abnormal   Collection Time: 09/07/20 12:08 PM  Result Value Ref Range   Glucose-Capillary 110 (H) 70 - 99 mg/dL    Comment: Glucose reference range applies only to samples taken after fasting for at least 8 hours.  Glucose, capillary     Status: Abnormal   Collection Time: 09/07/20  4:18 PM  Result Value Ref Range   Glucose-Capillary 111 (H) 70 - 99 mg/dL    Comment: Glucose reference range applies only to samples taken after fasting for at least 8 hours.  Glucose, capillary     Status: None   Collection Time: 09/07/20  9:36 PM  Result Value Ref Range   Glucose-Capillary 85 70 - 99 mg/dL    Comment: Glucose reference range applies only to samples taken after fasting for at least 8 hours.  Glucose, capillary     Status: None   Collection Time: 09/08/20  1:04 AM  Result Value Ref Range   Glucose-Capillary 85 70 - 99 mg/dL    Comment: Glucose reference range applies only to samples taken after fasting for at least 8 hours.  Hemoglobin A1c     Status: None   Collection Time: 09/08/20  4:32 AM  Result Value Ref Range   Hgb A1c MFr Bld 5.5 4.8 - 5.6 %    Comment: (NOTE) Pre diabetes:          5.7%-6.4%  Diabetes:              >6.4%  Glycemic control for   <7.0% adults with diabetes    Mean Plasma Glucose 111.15 mg/dL    Comment: Performed at [facility] [address] [place], [place] [address]  Lipid panel     Status: Abnormal   Collection Time: 09/08/20  4:32 AM  Result Value Ref Range   Cholesterol 144 0 - 200 mg/dL   Triglycerides 178 (H) <150 mg/dL   HDL 35 (L) >40 mg/dL   Total CHOL/HDL Ratio 4.1 RATIO   VLDL 36 0 - 40 mg/dL   LDL Cholesterol 73 0 - 99 mg/dL    Comment:        Total Cholesterol/HDL:CHD Risk Coronary Heart Disease Risk Table                      Men   Women  1/2 Average Risk   3.4   3.3  Average Risk       5.0   4.4  2 X Average Risk   9.6   7.1  3 X Average Risk  23.4   11.0        Use the calculated Patient Ratio above and the CHD Risk Table to determine the patient's CHD Risk.        ATP III CLASSIFICATION (LDL):  <100  mg/dL   Optimal  100-129  mg/dL   Near or Above                    Optimal  130-159  mg/dL   Borderline  160-189  mg/dL   High  >190     mg/dL   Very High Performed at [facility], [address] [place], [place] [address]   Glucose, capillary     Status: Abnormal   Collection Time: 09/08/20  4:47 AM  Result Value Ref Range   Glucose-Capillary 125 (H) 70 - 99 mg/dL    Comment: Glucose reference range applies only to samples taken after fasting for at least 8 hours.  Glucose, capillary     Status: Abnormal   Collection Time: 09/08/20  8:17 AM  Result Value Ref Range   Glucose-Capillary 106 (H) 70 - 99 mg/dL    Comment: Glucose reference range applies only to samples taken after fasting for at least 8 hours.  Glucose, capillary     Status: Abnormal   Collection Time: 09/08/20 11:59 AM  Result Value Ref Range   Glucose-Capillary 155 (H) 70 - 99 mg/dL    Comment: Glucose reference range applies only to samples taken after fasting for at least 8 hours.    Recent Results (from the past 240 hour(s))  Resp Panel by RT-PCR (Flu A&B, Covid) Nasopharyngeal Swab     Status: None   Collection Time: 09/02/20  2:37 AM   Specimen: Nasopharyngeal Swab; Nasopharyngeal(NP) swabs in vial transport medium  Result Value Ref Range Status   SARS Coronavirus 2 by RT PCR NEGATIVE NEGATIVE Final    Comment: (NOTE) SARS-CoV-2 target nucleic acids are NOT DETECTED.  The SARS-CoV-2 RNA is generally detectable in upper respiratory specimens during the acute phase of infection. The lowest concentration of SARS-CoV-2 viral copies this assay can detect is 138 copies/mL. A negative result  does not preclude SARS-Cov-2 infection and should not be used as the sole basis for treatment or other patient management decisions. A negative result may occur with  improper specimen collection/handling, submission of specimen other than nasopharyngeal swab, presence of viral mutation(s) within the areas targeted by this assay, and inadequate number of viral copies(<138 copies/mL). A negative result must be combined with clinical observations, patient history, and epidemiological information. The expected result is Negative.  Fact Sheet for Patients:  EntrepreneurPulse.com.au  Fact Sheet for Healthcare Providers:  IncredibleEmployment.be  This test is no t yet approved or cleared by the [place] FDA and  has been authorized for detection and/or diagnosis of SARS-CoV-2 by FDA under an Emergency Use Authorization (EUA). This EUA will remain  in effect (meaning this test can be used) for the duration of the COVID-19 declaration under Section 564(b)(1) of the Act, 21 U.S.C.section 360bbb-3(b)(1), unless the authorization is terminated  or revoked sooner.       Influenza A by PCR NEGATIVE NEGATIVE Final   Influenza B by PCR NEGATIVE NEGATIVE Final    Comment: (NOTE) The Xpert Xpress SARS-CoV-2/FLU/RSV plus assay is intended as an aid in the diagnosis of influenza from Nasopharyngeal swab specimens and should not be used as a sole basis for treatment. Nasal washings and aspirates are unacceptable for Xpert Xpress SARS-CoV-2/FLU/RSV testing.  Fact Sheet for Patients: EntrepreneurPulse.com.au  Fact Sheet for Healthcare Providers: IncredibleEmployment.be  This test is not yet approved or cleared by the [place] FDA and has been authorized for detection and/or diagnosis of SARS-CoV-2 by FDA under  an Emergency Use Authorization (EUA). This EUA will remain in effect (meaning this test can be used) for  the duration of the COVID-19 declaration under Section 564(b)(1) of the Act, 21 U.S.C. section 360bbb-3(b)(1), unless the authorization is terminated or revoked.  Performed at [facility], [address] [place], [place] [address]   Resp Panel by RT-PCR (Flu A&B, Covid)     Status: None   Collection Time: 09/07/20 11:45 AM   Specimen: Nasopharyngeal(NP) swabs in vial transport medium  Result Value Ref Range Status   SARS Coronavirus 2 by RT PCR NEGATIVE NEGATIVE Final    Comment: (NOTE) SARS-CoV-2 target nucleic acids are NOT DETECTED.  The SARS-CoV-2 RNA is generally detectable in upper respiratory specimens during the acute phase of infection. The lowest concentration of SARS-CoV-2 viral copies this assay can detect is 138 copies/mL. A negative result does not preclude SARS-Cov-2 infection and should not be used as the sole basis for treatment or other patient management decisions. A negative result may occur with  improper specimen collection/handling, submission of specimen other than nasopharyngeal swab, presence of viral mutation(s) within the areas targeted by this assay, and inadequate number of viral copies(<138 copies/mL). A negative result must be combined with clinical observations, patient history, and epidemiological information. The expected result is Negative.  Fact Sheet for Patients:  EntrepreneurPulse.com.au  Fact Sheet for Healthcare Providers:  IncredibleEmployment.be  This test is no t yet approved or cleared by the [place] FDA and  has been authorized for detection and/or diagnosis of SARS-CoV-2 by FDA under an Emergency Use Authorization (EUA). This EUA will remain  in effect (meaning this test can be used) for the duration of the COVID-19 declaration under Section 564(b)(1) of the Act, 21 U.S.C.section 360bbb-3(b)(1), unless the authorization is terminated  or revoked sooner.       Influenza A  by PCR NEGATIVE NEGATIVE Final   Influenza B by PCR NEGATIVE NEGATIVE Final    Comment: (NOTE) The Xpert Xpress SARS-CoV-2/FLU/RSV plus assay is intended as an aid in the diagnosis of influenza from Nasopharyngeal swab specimens and should not be used as a sole basis for treatment. Nasal washings and aspirates are unacceptable for Xpert Xpress SARS-CoV-2/FLU/RSV testing.  Fact Sheet for Patients: EntrepreneurPulse.com.au  Fact Sheet for Healthcare Providers: IncredibleEmployment.be  This test is not yet approved or cleared by the [place] FDA and has been authorized for detection and/or diagnosis of SARS-CoV-2 by FDA under an Emergency Use Authorization (EUA). This EUA will remain in effect (meaning this test can be used) for the duration of the COVID-19 declaration under Section 564(b)(1) of the Act, 21 U.S.C. section 360bbb-3(b)(1), unless the authorization is terminated or revoked.  Performed at [facility], [address] [place], [place] [address]     Lipid Panel Recent Labs    09/08/20 0432  CHOL 144  TRIG 178*  HDL 35*  CHOLHDL 4.1  VLDL 36  LDLCALC 73    Studies/Results: CT Angio Head W or Wo Contrast  Result Date: 09/07/2020 CLINICAL DATA:  Headache with some numbness on the right side. EXAM: CT ANGIOGRAPHY HEAD AND NECK TECHNIQUE: Multidetector CT imaging of the head and neck was performed using the standard protocol during bolus administration of intravenous contrast. Multiplanar CT image reconstructions and MIPs were obtained to evaluate the vascular anatomy. Carotid stenosis measurements (when applicable) are obtained utilizing NASCET criteria, using the distal internal carotid diameter as the denominator. CONTRAST:  54mL OMNIPAQUE IOHEXOL 350 MG/ML SOLN COMPARISON:  Same day head CT. FINDINGS: CTA NECK FINDINGS Aortic arch: Extensive calcific and noncalcific atherosclerosis of the partially imaged aorta.  Great vessel origins are patent. Right carotid system: Approximately 50-60% stenosis of the right common carotid artery origin. Predominantly calcific atherosclerosis at the carotid bifurcation without greater than 50% narrowing. Left carotid system: The left common carotid artery arises directly from the aorta with severe stenosis secondary atherosclerosis. Mixed calcific and noncalcific atherosclerosis at the carotid bifurcation with approximately 50% stenosis Vertebral arteries: Left dominant. Severe stenosis of the right vertebral artery origin. The right vertebral artery is diminutive and poorly opacified proximally and becomes non-opacified at approximately C2-C3. Moderate atherosclerotic narrowing of the proximal left vertebral artery. Skeleton: Severe degenerative disc disease at C4-C5 C5-C6. No evidence of acute fracture. Other neck: No mass or suspicious adenopathy. Upper chest: Partially imaged opacities in the right upper lobe, similar to prior CT chest. Review of the MIP images confirms the above findings CTA HEAD FINDINGS Anterior circulation: Bilateral petrous and cavernous internal carotid arteries are patent. Bilateral cavernous carotid calcific atherosclerosis without evidence of hemodynamically significant stenosis. Bilateral MCAs are patent without evidence hemodynamically significant proximal stenosis or large vessel occlusion. Right dominant A1 ACA with hypoplastic or absent left A1 ACA. No evidence of hemodynamically significant stenosis or large vessel occlusion involving the ACAs. No aneurysm. Posterior circulation: Non-opacified right intradural vertebral artery. The left intradural vertebral artery and basilar artery are patent. Bilateral PCAs are opacified proximally. Suspected severe stenosis of the left PCA at the P2/P3 junction with poor opacification of more distal PCA branches. Venous sinuses: As permitted by contrast timing, patent. Small left transverse and sigmoid sinus. Review of  the MIP images confirms the above findings IMPRESSION: 1. Intracranially, there is suspected severe stenosis of the left PCA at the P2/P3 junction with poor opacification of more distal PCA branches. Otherwise, no evidence of large vessel occlusion or hemodynamically significant stenosis intracranially. 2.  Extensive atherosclerosis in the neck, including: - Severe stenosis of the left common carotid origin and approximately 50-60% stenosis of the right common carotid artery origin. - Approximately 50% stenosis of the proximal left cervical ICA. - Severe stenosis of the non dominant right vertebral artery origin with poor opacification of the proximal right vertebral artery and non opacification distally. - Moderate narrowing of the dominant left vertebral artery proximally. 3. Partially imaged bilateral pulmonary emboli and right upper lobe opacities, which were better characterized on recent CTA of the chest from August 29, 2020. Findings were communicated on 09/07/2020 at 10:33 am to provider Dr. Charna Archer Via telephone, who verbally acknowledged these results. Electronically Signed   By: Margaretha Sheffield MD   On: 09/07/2020 10:36   CT Angio Neck W and/or Wo Contrast  Result Date: 09/07/2020 CLINICAL DATA:  Headache with some numbness on the right side. EXAM: CT ANGIOGRAPHY HEAD AND NECK TECHNIQUE: Multidetector CT imaging of the head and neck was performed using the standard protocol during bolus administration of intravenous contrast. Multiplanar CT image reconstructions and MIPs were obtained to evaluate the vascular anatomy. Carotid stenosis measurements (when applicable) are obtained utilizing NASCET criteria, using the distal internal carotid diameter as the denominator. CONTRAST:  7mL OMNIPAQUE IOHEXOL 350 MG/ML SOLN COMPARISON:  Same day head CT. FINDINGS: CTA NECK FINDINGS Aortic arch: Extensive calcific and noncalcific atherosclerosis of the partially imaged aorta. Great vessel origins are patent.  Right carotid system: Approximately 50-60% stenosis of the right common carotid artery origin. Predominantly calcific atherosclerosis at the carotid bifurcation without greater  than 50% narrowing. Left carotid system: The left common carotid artery arises directly from the aorta with severe stenosis secondary atherosclerosis. Mixed calcific and noncalcific atherosclerosis at the carotid bifurcation with approximately 50% stenosis Vertebral arteries: Left dominant. Severe stenosis of the right vertebral artery origin. The right vertebral artery is diminutive and poorly opacified proximally and becomes non-opacified at approximately C2-C3. Moderate atherosclerotic narrowing of the proximal left vertebral artery. Skeleton: Severe degenerative disc disease at C4-C5 C5-C6. No evidence of acute fracture. Other neck: No mass or suspicious adenopathy. Upper chest: Partially imaged opacities in the right upper lobe, similar to prior CT chest. Review of the MIP images confirms the above findings CTA HEAD FINDINGS Anterior circulation: Bilateral petrous and cavernous internal carotid arteries are patent. Bilateral cavernous carotid calcific atherosclerosis without evidence of hemodynamically significant stenosis. Bilateral MCAs are patent without evidence hemodynamically significant proximal stenosis or large vessel occlusion. Right dominant A1 ACA with hypoplastic or absent left A1 ACA. No evidence of hemodynamically significant stenosis or large vessel occlusion involving the ACAs. No aneurysm. Posterior circulation: Non-opacified right intradural vertebral artery. The left intradural vertebral artery and basilar artery are patent. Bilateral PCAs are opacified proximally. Suspected severe stenosis of the left PCA at the P2/P3 junction with poor opacification of more distal PCA branches. Venous sinuses: As permitted by contrast timing, patent. Small left transverse and sigmoid sinus. Review of the MIP images confirms the  above findings IMPRESSION: 1. Intracranially, there is suspected severe stenosis of the left PCA at the P2/P3 junction with poor opacification of more distal PCA branches. Otherwise, no evidence of large vessel occlusion or hemodynamically significant stenosis intracranially. 2.  Extensive atherosclerosis in the neck, including: - Severe stenosis of the left common carotid origin and approximately 50-60% stenosis of the right common carotid artery origin. - Approximately 50% stenosis of the proximal left cervical ICA. - Severe stenosis of the non dominant right vertebral artery origin with poor opacification of the proximal right vertebral artery and non opacification distally. - Moderate narrowing of the dominant left vertebral artery proximally. 3. Partially imaged bilateral pulmonary emboli and right upper lobe opacities, which were better characterized on recent CTA of the chest from August 29, 2020. Findings were communicated on 09/07/2020 at 10:33 am to provider Dr. Charna Archer Via telephone, who verbally acknowledged these results. Electronically Signed   By: Margaretha Sheffield MD   On: 09/07/2020 10:36   MR BRAIN WO CONTRAST  Addendum Date: 09/07/2020   ADDENDUM REPORT: 09/07/2020 14:42 ADDENDUM: Findings discussed with Dr. Alexis Goodell via telephone at 2:40 PM. Electronically Signed   By: Margaretha Sheffield MD   On: 09/07/2020 14:42   Result Date: 09/07/2020 EXAM: MRI HEAD WITHOUT CONTRAST TECHNIQUE: Multiplanar, multiecho pulse sequences of the brain and surrounding structures were obtained without intravenous contrast. COMPARISON:  Same day CT exams. FINDINGS: Brain: Acute left PCA territory infarct which involves the left occipital lobe, left hippocampus, and left thalamus. Mild associated edema without substantial mass effect. Additionally, there small infarcts in the left frontal lobe, left caudate, and right cerebellar hemisphere. No hydrocephalus. No mass lesion or suspect antral mass effect.  No extra-axial fluid collection. Vascular: Further evaluated on same day CTA Skull and upper cervical spine: Normal marrow signal. Sinuses/Orbits: No acute finding. Other: No mastoid effusions. IMPRESSION: 1. Acute left PCA territory infarct which involves the left occipital lobe, left hippocampus, and left thalamus. Mild associated edema without substantial mass effect. 2. Additionally, there small infarcts in the left frontal lobe, left caudate,  and right cerebellar hemisphere. Given involvement of multiple vascular territories, consider centrum embolic source. Electronically Signed: By: Margaretha Sheffield MD On: 09/07/2020 14:13   CT HEAD CODE STROKE WO CONTRAST  Result Date: 09/07/2020 CLINICAL DATA:  Code stroke. Neuro deficit, acute stroke suspected. Right-sided weakness and headache. EXAM: CT HEAD WITHOUT CONTRAST TECHNIQUE: Contiguous axial images were obtained from the base of the skull through the vertex without intravenous contrast. COMPARISON:  None. FINDINGS: Brain: No evidence of acute large vascular territory infarction, hemorrhage, hydrocephalus, extra-axial collection or mass lesion/mass effect. Patchy white matter hypoattenuation, nonspecific but most likely secondary to chronic microvascular ischemic disease. Vascular: Intracranial calcific atherosclerosis. No hyperdense vessel identified. Skull: No acute fracture. Sinuses/Orbits: No acute findings. Other: No mastoid effusions. ASPECTS [facility] Stroke Program Early CT Score) total score (0-10 with 10 being normal): 10. IMPRESSION: No evidence of acute intracranial abnormality.  ASPECTS is 10. Code stroke imaging results were communicated on 09/07/2020 at 9:54 am to provider Dr. Charna Archer via telephone, who verbally acknowledged these results. Electronically Signed   By: Margaretha Sheffield MD   On: 09/07/2020 09:57    Medications:  Scheduled: .  stroke: mapping our early stages of recovery book   Does not apply Once  . apixaban  10 mg Oral  BID   Followed by  . [START ON 09/11/2020] apixaban  5 mg Oral BID  . aspirin EC  81 mg Oral Daily  . capecitabine  1,500 mg Oral BID PC  . docusate sodium  100 mg Oral BID  . dorzolamide-timolol  1 drop Both Eyes BID  . DULoxetine  60 mg Oral Daily  . ferrous sulfate  325 mg Oral BID WC  . gabapentin  300 mg Oral TID  . insulin aspart  0-15 Units Subcutaneous Q4H  . latanoprost  1 drop Both Eyes QHS  . multivitamin with minerals  1 tablet Oral Daily  . omega-3 acid ethyl esters  1 g Oral BID  . pantoprazole  40 mg Oral Daily  . rosuvastatin  10 mg Oral Daily   TTE:  1. Left ventricular ejection fraction, by estimation, is 55 to 60%. The  left ventricle has normal function. The left ventricle has no regional  wall motion abnormalities. Left ventricular diastolic parameters are  consistent with Grade I diastolic  dysfunction (impaired relaxation).  2. Right ventricular systolic function is normal. The right ventricular  size is normal.  3. The mitral valve is normal in structure. No evidence of mitral valve  regurgitation.  4. The aortic valve is grossly normal. There is mild calcification of the  aortic valve. There is mild thickening of the aortic valve. Aortic valve  regurgitation is not visualized. Mild to moderate aortic valve  sclerosis/calcification is present, without  any evidence of aortic stenosis.   MRI brain:  1. Acute left PCA territory infarct which involves the left occipital lobe, left hippocampus, and left thalamus. Mild associated edema without substantial mass effect. 2. Additionally, there small infarcts in the left frontal lobe, left caudate, and right cerebellar hemisphere. Given involvement of multiple vascular territories, consider centrum embolic source.  Assessment: 74 y.o. 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 Awakened  yesterday noting that his right side was numb and weak, particularly the RLE. Patient also noted a change in vision.  1. MRI brain reveals a subacute left PCA territory ischemic infarction  involving the left occipital lobe, posteromedial temporal lobe and body of the left hippocampus. Left thalamus and additional scattered microembolic infarctions in separate vascular territories are also noted. Suspect a proximal source for emboli such as aortic arch, cardiac mural thrombus or due to intermittent atrial fibrillation.  2. TTE reveals no source for cardiac emboli 3. CTA of the head and neck reviewed and shows severe left P2/P3 stenosis, 50-60% right common carotid stenosis, severe left common carotid stenosis, severe right vertebral stenosis.   4. Per patient, he has a life expectancy of less than 6 months from pancreatic cancer.   5. Stroke Risk Factors - diabetes mellitus, hyperlipidemia and hypertension  Recommendations: 1. TEE could be obtained to assess for possible mural thrombus not detectable on TTE. However, he is already on anticoagulation for his PE and has a limited life expectancy. Risks of TEE most likely outweigh potential benefits.  2. Telemetry monitoring  3. Prophylactic therapy-Continue Eliquis 4. Also on ASA for his CAD.  5. Outpatient Neurology follow up.    LOS: 1 day   @Electronically  signed: Dr. Kerney Elbe 09/08/2020  12:39 PM

## 2020-09-08 NOTE — Care Management Obs Status (Signed)
[facility] NOTIFICATION   Patient Details  Name: Craig [name] MRN: [id number] Date of Birth: [birth year]   Medicare Observation Status Notification Given:  Discussed with [place] on phone  and he states understanding.    Dellie Catholic, RN 09/08/2020, 5:39 PM

## 2020-09-09 ENCOUNTER — Inpatient Hospital Stay: Payer: Medicare Other

## 2020-09-09 ENCOUNTER — Telehealth: Payer: Self-pay

## 2020-09-09 ENCOUNTER — Inpatient Hospital Stay (HOSPITAL_BASED_OUTPATIENT_CLINIC_OR_DEPARTMENT_OTHER): Payer: Medicare Other | Admitting: Hospice and Palliative Medicine

## 2020-09-09 VITALS — BP 146/89 | HR 100 | Temp 97.8°F | Resp 19

## 2020-09-09 VITALS — BP 146/89 | HR 104 | Temp 97.2°F | Resp 18

## 2020-09-09 DIAGNOSIS — G620 Drug-induced polyneuropathy: Secondary | ICD-10-CM | POA: Diagnosis not present

## 2020-09-09 DIAGNOSIS — I739 Peripheral vascular disease, unspecified: Secondary | ICD-10-CM | POA: Diagnosis not present

## 2020-09-09 DIAGNOSIS — K219 Gastro-esophageal reflux disease without esophagitis: Secondary | ICD-10-CM | POA: Diagnosis not present

## 2020-09-09 DIAGNOSIS — E876 Hypokalemia: Secondary | ICD-10-CM | POA: Diagnosis not present

## 2020-09-09 DIAGNOSIS — I2699 Other pulmonary embolism without acute cor pulmonale: Secondary | ICD-10-CM | POA: Diagnosis not present

## 2020-09-09 DIAGNOSIS — T451X5A Adverse effect of antineoplastic and immunosuppressive drugs, initial encounter: Secondary | ICD-10-CM | POA: Diagnosis not present

## 2020-09-09 DIAGNOSIS — E860 Dehydration: Secondary | ICD-10-CM | POA: Diagnosis not present

## 2020-09-09 DIAGNOSIS — I672 Cerebral atherosclerosis: Secondary | ICD-10-CM | POA: Diagnosis not present

## 2020-09-09 DIAGNOSIS — R5383 Other fatigue: Secondary | ICD-10-CM | POA: Diagnosis not present

## 2020-09-09 DIAGNOSIS — R200 Anesthesia of skin: Secondary | ICD-10-CM | POA: Diagnosis not present

## 2020-09-09 DIAGNOSIS — I639 Cerebral infarction, unspecified: Secondary | ICD-10-CM | POA: Diagnosis not present

## 2020-09-09 DIAGNOSIS — R160 Hepatomegaly, not elsewhere classified: Secondary | ICD-10-CM | POA: Diagnosis not present

## 2020-09-09 DIAGNOSIS — R531 Weakness: Secondary | ICD-10-CM

## 2020-09-09 DIAGNOSIS — C259 Malignant neoplasm of pancreas, unspecified: Secondary | ICD-10-CM | POA: Diagnosis not present

## 2020-09-09 DIAGNOSIS — I2510 Atherosclerotic heart disease of native coronary artery without angina pectoris: Secondary | ICD-10-CM | POA: Diagnosis not present

## 2020-09-09 DIAGNOSIS — I509 Heart failure, unspecified: Secondary | ICD-10-CM | POA: Diagnosis not present

## 2020-09-09 DIAGNOSIS — C787 Secondary malignant neoplasm of liver and intrahepatic bile duct: Secondary | ICD-10-CM | POA: Diagnosis not present

## 2020-09-09 DIAGNOSIS — E611 Iron deficiency: Secondary | ICD-10-CM | POA: Diagnosis not present

## 2020-09-09 DIAGNOSIS — D6481 Anemia due to antineoplastic chemotherapy: Secondary | ICD-10-CM | POA: Diagnosis not present

## 2020-09-09 DIAGNOSIS — Z7189 Other specified counseling: Secondary | ICD-10-CM

## 2020-09-09 DIAGNOSIS — E119 Type 2 diabetes mellitus without complications: Secondary | ICD-10-CM | POA: Diagnosis not present

## 2020-09-09 DIAGNOSIS — I6502 Occlusion and stenosis of left vertebral artery: Secondary | ICD-10-CM | POA: Diagnosis not present

## 2020-09-09 DIAGNOSIS — I110 Hypertensive heart disease with heart failure: Secondary | ICD-10-CM | POA: Diagnosis not present

## 2020-09-09 DIAGNOSIS — Z5111 Encounter for antineoplastic chemotherapy: Secondary | ICD-10-CM | POA: Diagnosis not present

## 2020-09-09 DIAGNOSIS — R918 Other nonspecific abnormal finding of lung field: Secondary | ICD-10-CM | POA: Diagnosis not present

## 2020-09-09 DIAGNOSIS — C7951 Secondary malignant neoplasm of bone: Secondary | ICD-10-CM | POA: Diagnosis not present

## 2020-09-09 LAB — CBC WITH DIFFERENTIAL/PLATELET
Abs Immature Granulocytes: 0.09 10*3/uL — ABNORMAL HIGH (ref 0.00–0.07)
Basophils Absolute: 0.1 10*3/uL (ref 0.0–0.1)
Basophils Relative: 1 %
Eosinophils Absolute: 0.3 10*3/uL (ref 0.0–0.5)
Eosinophils Relative: 3 %
HCT: 33 % — ABNORMAL LOW (ref 39.0–52.0)
Hemoglobin: 10.1 g/dL — ABNORMAL LOW (ref 13.0–17.0)
Immature Granulocytes: 1 %
Lymphocytes Relative: 16 %
Lymphs Abs: 1.4 10*3/uL (ref 0.7–4.0)
MCH: 28.7 pg (ref 26.0–34.0)
MCHC: 30.6 g/dL (ref 30.0–36.0)
MCV: 93.8 fL (ref 80.0–100.0)
Monocytes Absolute: 1.6 10*3/uL — ABNORMAL HIGH (ref 0.1–1.0)
Monocytes Relative: 19 %
Neutro Abs: 5 10*3/uL (ref 1.7–7.7)
Neutrophils Relative %: 60 %
Platelets: 285 10*3/uL (ref 150–400)
RBC: 3.52 MIL/uL — ABNORMAL LOW (ref 4.22–5.81)
RDW: 18.4 % — ABNORMAL HIGH (ref 11.5–15.5)
WBC: 8.4 10*3/uL (ref 4.0–10.5)
nRBC: 0.2 % (ref 0.0–0.2)

## 2020-09-09 LAB — COMPREHENSIVE METABOLIC PANEL
ALT: 21 U/L (ref 0–44)
AST: 57 U/L — ABNORMAL HIGH (ref 15–41)
Albumin: 3 g/dL — ABNORMAL LOW (ref 3.5–5.0)
Alkaline Phosphatase: 219 U/L — ABNORMAL HIGH (ref 38–126)
Anion gap: 12 (ref 5–15)
BUN: 21 mg/dL (ref 8–23)
CO2: 23 mmol/L (ref 22–32)
Calcium: 9.1 mg/dL (ref 8.9–10.3)
Chloride: 100 mmol/L (ref 98–111)
Creatinine, Ser: 0.88 mg/dL (ref 0.61–1.24)
GFR, Estimated: >60 mL/min (ref >60)
Glucose, Bld: 97 mg/dL (ref 70–99)
Potassium: 4.3 mmol/L (ref 3.5–5.1)
Sodium: 135 mmol/L (ref 135–145)
Total Bilirubin: 1.5 mg/dL — ABNORMAL HIGH (ref 0.3–1.2)
Total Protein: 7 g/dL (ref 6.5–8.1)

## 2020-09-09 LAB — SAMPLE TO BLOOD BANK

## 2020-09-09 MED ORDER — SODIUM CHLORIDE 0.9% FLUSH
10.0000 mL | Freq: Once | INTRAVENOUS | Status: AC
  Administered 2020-09-09: 10 mL via INTRAVENOUS
  Filled 2020-09-09: qty 10

## 2020-09-09 MED ORDER — SODIUM CHLORIDE 0.9 % IV SOLN
Freq: Once | INTRAVENOUS | Status: AC
  Filled 2020-09-09: qty 250

## 2020-09-09 MED ORDER — HEPARIN SOD (PORK) LOCK FLUSH 100 UNIT/ML IV SOLN
500.0000 [IU] | Freq: Once | INTRAVENOUS | Status: AC
  Administered 2020-09-09: 500 [IU] via INTRAVENOUS
  Filled 2020-09-09: qty 5

## 2020-09-09 NOTE — Telephone Encounter (Signed)
Please see MD recommendation.  He needs to be scheduled for Lab/MD/Gemcitabine/Docetaxel *new* (r/s from 09/08/20)/possible blood transfusion next week.

## 2020-09-09 NOTE — Telephone Encounter (Signed)
Secure chat message received from [facility], RN:  just spoke with this pt's sister who is concerned that he may be dehydrated. since he arrived at her house after discharge he has not been eating or drinking and has not urinated. she wants to know if he needs to be seen.  his sister called me because her late husband was a lung cancer patient and she had my number and didnt know who else to call   Dr. Collie Siad response:  I see. I just saw him yesterday.  team he needs to see [facility], cbc cmp, + IVF, and also needs to see Graceann Congress, RN [facility] nurse) would like patient to come in ASAP.

## 2020-09-09 NOTE — Progress Notes (Signed)
In [facility]. Pt discharged from hospital post acute stroke with residual right sided weakness. Pt has stage IV Pancreatic cancer.Getting 1 liter of NS for "dehydration" according to sister. Pt requested a diet cola to drink. Sister seems over- whelmed with discharge of pt from hospital yesterday evening. States I want to take care of patient at my house. I asked where wife was, she said I don't know supposedly in [place]. Josh, palliative care is seeing pt today. Tolerated fluids well. Pt states he is passing urine well today. Discharge from clinic via wheelchair accompanied by sister.

## 2020-09-09 NOTE — TOC Transition Note (Signed)
Transition of Care [facility]) - CM/SW Discharge Note   Patient Details  Name: Craig [name] MRN: [id number] Date of Birth: March 21, 1946  Transition of Care [facility]) CM/SW Contact:  Magnus Ivan, LCSW Phone Number: 09/09/2020, 8:34 AM   Clinical Narrative:   CSW was informed by RN that patient's sister came to bedside and reported she wants patient to go to SNF rehab, but that after sister left, patient said he wants to return home. Patient is alert and oriented x 4. CSW called and spoke to patient. Patient reported multiple times he does not want to go to SNF rehab. CSW explained that CSW was unable to find coverage for Home Health after reaching out to all of our preferred agencies today ([facility], [facility], [facility], [facility], [facility], and [facility]). Asked if patient feels safe returning home since his wife is not always home due to caring for another family member. Patient reported he still feels safe returning home and again reported he does not want to go to SNF rehab. He reported he can have his brother in law take him to Outpatient PT and OT. List provided for RN to give to patient. Patient reported his sister will pick him up at discharge and denied additional needs.   Final next level of care: OP Rehab Barriers to Discharge: Barriers Resolved   Patient Goals and CMS Choice Patient states their goals for this hospitalization and ongoing recovery are:: to return home CMS Medicare.gov Compare Post Acute Care list provided to:: Patient Choice offered to / list presented to : Patient  Discharge Placement                Patient to be transferred to facility by: sister Name of family member notified: patient notified Patient and family notified of of transfer: 09/09/20  Discharge Plan and Services                DME Arranged: Kasandra Knudsen DME Agency: [employer] Date DME Agency Contacted: 09/08/20   Representative spoke with at DME Agency: [place] ([place]) Interventions     Readmission Risk Interventions Readmission Risk Prevention Plan 09/08/2020  Transportation Screening Complete  PCP or Specialist Appt within 3-5 Days Complete  HRI or [place] Complete  Social Work Consult for [place] Planning/Counseling Complete  Palliative Care Screening Complete  Medication Review [employer]) Complete  Some recent data might be hidden

## 2020-09-09 NOTE — Progress Notes (Signed)
Patient and sister stated they did not agree with being discharged yesterday evening. His mind comes and goes since stroke. Has no balance.Weak. High fall risk. He cannot be alone at home. Wife is out and about. Pt has not had any medications since leaving hospital. Sister is at a loss of what to do. Pt states he agreed to go to SNF for 20 days of medicare rehab but that did not happen. Went over medications with sister. Explained eliquis dosages as is written on discharge papers. All medications remain the same except he was told to stop taking diltiazem.

## 2020-09-09 NOTE — Telephone Encounter (Signed)
-----   Message from Earlie Server, MD sent at 09/08/2020  5:49 PM EST ----- He is admitted due to acute stroke. Being discharged today.  Please schedule him to see me- 1 week or so  for lab md Gemcitabine Docetaxel +GCSF (the same treatment he was scheduled to do today, also see if he can get hold tube and +/- 1 PRBC transfusion. Thanks.

## 2020-09-09 NOTE — Telephone Encounter (Signed)
Patient was seen by Billey Chang and [facility] today.  Patient is scheduled to see Dr. Tasia Catchings and Merrily Pew B this Friday 09/12/20.  Will get an update on need of tx scheduling at that appt.

## 2020-09-09 NOTE — Progress Notes (Signed)
[facility]  [phone] [phone]  Patient Care Team: Paulene Floor as PCP - General (Physician Assistant) Clent Jacks, RN as Oncology Nurse Navigator Earlie Server, MD as Consulting Physician (Hematology and Oncology) Neldon Labella, RN as Case Manager Lonia Farber, MD as Consulting Physician (Internal Medicine)   Name of the patient: Craig [name]  [id number]  1946/01/25   Date of visit: 09/09/20  Reason for Consult: [name] is a 74 y.o. male with multiple medical problems including CAD status post CABG x5, CM with EF of 35-40 %, PVD, former tobacco/alcohol abuse, DM with history of DKA, peripheral neuropathy, depression, and stage IV pancreatic cancer metastatic to liver and possibly lung (diagnosed 05/2019) who has been treated on several lines of chemotherapy most recently with gemcitabine/docetaxel/capecitabine.    Patient was hospitalized 09/02/2020-09/04/2020 with bilateral pulmonary emboli.  Patient was started on Eliquis.  Patient was again hospitalized 09/07/2020-09/08/2020 with right-sided weakness and was found to have an acute CVA.  CTA of the head and neck showed severe stenosis of the left PCA.  MRI of the brain revealed acute left PCA territory infarct with additional small infarcts in the left frontal lobe, left caudate, and right cerebellar hemisphere.  Patient was treated conservatively as he was already anticoagulated on p.o. Eliquis and has a limited life expectancy.  Rehab was recommended but patient ultimately declined and was discharged home.  Since discharging home, patient reportedly has had poor oral intake since discharging home from the hospital.  Family report that he has had some underlying confusion since the stroke.    Denies any neurologic complaints. Denies recent fevers or illnesses. Denies any easy bleeding or bruising. Reports good appetite and denies  weight loss. Denies chest pain. Denies any nausea, vomiting, constipation, or diarrhea. Denies urinary complaints. Patient offers no further specific complaints today.    PAST MEDICAL HISTORY: Past Medical History:  Diagnosis Date  . Allergic rhinitis   . CHF (congestive heart failure) ([place])   . Chronic cholecystitis   . Coronary artery disease   . DDD (degenerative disc disease), cervical   . Dehydration 06/21/2019  . Diabetes mellitus without complication ([place])   . Dyspnea   . Early cataract   . Erectile dysfunction   . Family history of brain cancer   . Family history of cancer   . GERD (gastroesophageal reflux disease)   . Gouty arthritis   . Headache    occasional migraines  . Hypercalcemia   . Hyperlipemia   . Hypertension   . Palpitations   . Pancreatic cancer metastasized to liver ([place]) 05/2019   Chemo tx's  . Peripheral vascular disease ([place])   . 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   LIMA to LAD SVG to [place] SVG SEQUENTIALLY to OM1 and OM2 SVG to ACUTE MARGINAL  . Spinal stenosis of cervical region   . Vitamin D deficiency     PAST SURGICAL HISTORY:  Past Surgical History:  Procedure Laterality Date  . BACK SURGERY  2018    fusion with screws  . CHOLECYSTECTOMY N/A 11/13/2018   Procedure: LAPAROSCOPIC CHOLECYSTECTOMY;  Surgeon: Vickie Epley, MD;  Location: [facility] ORS;  Service: General;  Laterality: N/A;  . COLONOSCOPY    . CORONARY ARTERY BYPASS GRAFT N/A 09/30/2017   Procedure: CORONARY ARTERY BYPASS GRAFTING times five using right and left Saphaneous vein harvested endoscopicly  and left internal mammary artery. (CABG),TEE;  Surgeon: Rexene Alberts, MD;  Location: [facility] OR;  Service: Open Heart Surgery;  Laterality: N/A;  . LEFT HEART CATH AND CORONARY ANGIOGRAPHY Left 09/06/2017   Procedure: LEFT HEART CATH AND CORONARY ANGIOGRAPHY;  Surgeon: Corey Skains, MD;  Location: [facility];  Service: Cardiovascular;  Laterality: Left;  . percutaneous  transluminal balloon angioplasty  01/2010   of left lower extremity  . PORTA CATH INSERTION N/A 06/06/2019   Procedure: PORTA CATH INSERTION;  Surgeon: Katha Cabal, MD;  Location: [facility];  Service: Cardiovascular;  Laterality: N/A;  . TEE WITHOUT CARDIOVERSION N/A 09/30/2017   Procedure: TRANSESOPHAGEAL ECHOCARDIOGRAM (TEE);  Surgeon: Rexene Alberts, MD;  Location: [facility];  Service: Open Heart Surgery;  Laterality: N/A;  . VASCULAR SURGERY Left 2010   left exernal iliac and superficial femoral artery PTCA and stenting    HEMATOLOGY/ONCOLOGY HISTORY:  Oncology History  Pancreatic cancer metastasized to liver ([place])  06/01/2019 Initial Diagnosis   Pancreatic cancer metastasized to liver ([place])   06/07/2019 - 10/31/2019 Chemotherapy   The patient had PACLitaxel-protein bound (ABRAXANE) chemo infusion 250 mg, 125 mg/m2 = 250 mg (100 % of original dose 125 mg/m2), Intravenous,  Once, 4 of 4 cycles Dose modification: 100 mg/m2 (original dose 125 mg/m2, Cycle 1, Reason: Provider Judgment), 125 mg/m2 (original dose 125 mg/m2, Cycle 1, Reason: Provider Judgment), 100 mg/m2 (original dose 125 mg/m2, Cycle 2, Reason: Dose not tolerated, Comment: neuropathy), 90 mg/m2 (original dose 125 mg/m2, Cycle 3, Reason: Dose not tolerated, Comment: neuropathy), 100 mg/m2 (original dose 125 mg/m2, Cycle 3, Reason: Dose not tolerated) Administration: 250 mg (06/07/2019), 250 mg (06/14/2019), 250 mg (06/21/2019), 200 mg (07/05/2019), 200 mg (07/12/2019), 200 mg (07/26/2019), 200 mg (08/02/2019), 200 mg (08/16/2019), 200 mg (08/31/2019) gemcitabine (GEMZAR) 2,000 mg in sodium chloride 0.9 % 250 mL chemo infusion, 2,090 mg, Intravenous,  Once, 7 of 8 cycles Dose modification: 800 mg/m2 (original dose 1,000 mg/m2, Cycle 5, Reason: Dose not tolerated), 1,000 mg/m2 (original dose 1,000 mg/m2, Cycle 5, Reason: Provider Judgment) Administration: 2,000 mg (06/07/2019), 2,000 mg (09/14/2019), 2,000 mg (09/21/2019), 2,000  mg (10/03/2019), 2,000 mg (10/10/2019), 2,000 mg (06/14/2019), 2,000 mg (07/05/2019), 1,600 mg (07/12/2019), 1,600 mg (07/26/2019), 1,600 mg (08/02/2019), 1,600 mg (08/16/2019), 1,600 mg (08/31/2019), 2,000 mg (10/24/2019), 2,000 mg (10/31/2019)  for chemotherapy treatment.    11/19/2019 - 12/31/2019 Chemotherapy   The patient had pembrolizumab (KEYTRUDA) 200 mg in sodium chloride 0.9 % 50 mL chemo infusion, 200 mg, Intravenous, Once, 3 of 6 cycles Administration: 200 mg (11/19/2019), 200 mg (12/31/2019), 200 mg (12/10/2019)  for chemotherapy treatment.    01/07/2020 - 05/29/2020 Chemotherapy   The patient had dexamethasone (DECADRON) 4 MG tablet, 8 mg, Oral, Daily, 1 of 1 cycle, Start date: 01/02/2020, End date: 01/07/2020 palonosetron (ALOXI) injection 0.25 mg, 0.25 mg, Intravenous,  Once, 11 of 12 cycles Administration: 0.25 mg (01/07/2020), 0.25 mg (01/21/2020), 0.25 mg (02/04/2020), 0.25 mg (02/18/2020), 0.25 mg (03/03/2020), 0.25 mg (03/17/2020), 0.25 mg (03/31/2020), 0.25 mg (04/15/2020), 0.25 mg (04/29/2020), 0.25 mg (05/13/2020), 0.25 mg (05/27/2020) fluorouracil (ADRUCIL) 4,550 mg in sodium chloride 0.9 % 59 mL chemo infusion, 2,400 mg/m2 = 4,550 mg, Intravenous, 1 Day/Dose, 11 of 12 cycles Dose modification: 800 mg/m2/day (original dose 800 mg/m2/day, Cycle 6) Administration: 4,550 mg (01/07/2020), 4,550 mg (01/21/2020), 4,550 mg (02/04/2020), 4,550 mg (02/18/2020), 4,550 mg (03/03/2020), 4,550 mg (03/17/2020), 4,550 mg (03/31/2020), 4,550 mg (04/15/2020), 4,550 mg (04/29/2020), 1,500 mg (03/19/2020), 4,550 mg (05/13/2020), 4,550 mg (05/27/2020) irinotecan LIPOSOME (ONIVYDE) 96 mg in sodium chloride  0.9 % 500 mL chemo infusion, 94.6 mg (100 % of original dose 50 mg/m2), Intravenous, Once, 11 of 12 cycles Dose modification: 50 mg/m2 (original dose 50 mg/m2, Cycle 1, Reason: Provider Judgment) Administration: 96 mg (01/07/2020), 129 mg (01/21/2020), 129 mg (02/04/2020), 129 mg (02/18/2020), 129 mg (03/03/2020), 129 mg (03/17/2020), 129 mg  (03/31/2020), 129 mg (04/15/2020), 129 mg (04/29/2020), 129 mg (05/13/2020), 129 mg (05/27/2020) leucovorin 800 mg in sodium chloride 0.9 % 250 mL infusion, 760 mg, Intravenous,  Once, 11 of 12 cycles Administration: 800 mg (01/07/2020), 800 mg (01/21/2020), 800 mg (02/04/2020), 800 mg (02/18/2020), 800 mg (03/03/2020), 800 mg (03/17/2020), 800 mg (03/31/2020), 800 mg (04/15/2020), 800 mg (04/29/2020), 800 mg (05/13/2020), 800 mg (05/27/2020)  for chemotherapy treatment.    06/10/2020 - 08/21/2020 Chemotherapy   The patient had palonosetron (ALOXI) injection 0.25 mg, 0.25 mg, Intravenous,  Once, 6 of 7 cycles Administration: 0.25 mg (06/10/2020), 0.25 mg (06/24/2020), 0.25 mg (07/08/2020), 0.25 mg (07/22/2020), 0.25 mg (08/05/2020), 0.25 mg (08/19/2020) leucovorin 800 mg in dextrose 5 % 250 mL infusion, 410 mg/m2 = 780 mg, Intravenous,  Once, 6 of 7 cycles Administration: 800 mg (06/10/2020), 800 mg (06/24/2020), 800 mg (07/08/2020), 800 mg (07/22/2020), 800 mg (08/05/2020) oxaliplatin (ELOXATIN) 165 mg in dextrose 5 % 500 mL chemo infusion, 85 mg/m2 = 165 mg, Intravenous,  Once, 6 of 7 cycles Dose modification: 65 mg/m2 (original dose 85 mg/m2, Cycle 6, Reason: Other (see comments), Comment: neuropathy) Administration: 165 mg (06/10/2020), 165 mg (06/24/2020), 165 mg (07/08/2020), 165 mg (07/22/2020), 165 mg (08/05/2020), 125 mg (08/19/2020) fluorouracil (ADRUCIL) 4,700 mg in sodium chloride 0.9 % 56 mL chemo infusion, 2,400 mg/m2 = 4,700 mg, Intravenous, 1 Day/Dose, 6 of 7 cycles Administration: 4,700 mg (06/10/2020), 4,700 mg (06/24/2020), 4,700 mg (07/08/2020), 4,700 mg (07/22/2020), 4,700 mg (08/05/2020), 4,700 mg (08/19/2020)  for chemotherapy treatment.     Genetic Testing   Negative genetic testing. No pathogenic variants identified on the [facility] Common Hereditary Cancers Panel + RNA. The report date is 09/01/2020.  The Common Hereditary Cancers Panel offered by [facility] includes sequencing and/or deletion duplication  testing of the following 47 genes: APC, ATM, AXIN2, BARD1, BMPR1A, BRCA1, BRCA2, BRIP1, CDH1, CDKN2A (p14ARF), CDKN2A (p16INK4a), CKD4, CHEK2, CTNNA1, DICER1, EPCAM (Deletion/duplication testing only), GREM1 (promoter region deletion/duplication testing only), KIT, MEN1, MLH1, MSH2, MSH3, MSH6, MUTYH, NBN, NF1, NHTL1, PALB2, PDGFRA, PMS2, POLD1, POLE, PTEN, RAD50, RAD51C, RAD51D, SDHB, SDHC, SDHD, SMAD4, SMARCA4. STK11, TP53, TSC1, TSC2, and VHL.  The following genes were evaluated for sequence changes only: SDHA and HOXB13 c.251G>A variant only.   09/08/2020 -  Chemotherapy   The patient had capecitabine (XELODA) 500 MG tablet, 1,500 mg, Oral, 2 times daily after meals, 1 of 1 cycle, Start date: 09/02/2020, End date: -- [id number] [facility]) injection 6 mg, 6 mg, Subcutaneous, Once, 0 of 6 cycles DOCEtaxel (TAXOTERE) 50 mg in sodium chloride 0.9 % 150 mL chemo infusion, 30 mg/m2, Intravenous,  Once, 0 of 6 cycles gemcitabine (GEMZAR) 1,368 mg in sodium chloride 0.9 % 250 mL chemo infusion, 750 mg/m2, Intravenous,  Once, 0 of 6 cycles  for chemotherapy treatment.      ALLERGIES:  is allergic to lipitor [atorvastatin], zetia [ezetimibe], lisinopril, protonix [pantoprazole], and spironolactone.  MEDICATIONS:  Current Outpatient Medications  Medication Sig Dispense Refill  . acetaminophen (TYLENOL) 325 MG tablet Take 2 tablets (650 mg total) by mouth every 6 (six) hours as needed for fever, headache or moderate pain.    [name] Kitchen apixaban (ELIQUIS) 5 MG  TABS tablet Take 2 tablets (10 mg total) by mouth 2 (two) times daily for 7 days. 28 tablet 0  . [START ON 09/11/2020] apixaban (ELIQUIS) 5 MG TABS tablet Take 1 tablet (5 mg total) by mouth 2 (two) times daily. 60 tablet 11  . aspirin EC 81 MG tablet Take 81 mg by mouth daily.    . capecitabine (XELODA) 500 MG tablet Take 3 tablets (1,500 mg total) by mouth 2 (two) times daily after a meal. Take for 14 days on, 7 days off. Repeat every 21 days. 84  tablet 0  . carvedilol (COREG) 6.25 MG tablet Take 6.25 mg by mouth 2 (two) times daily with a meal.  3  . docusate sodium (COLACE) 100 MG capsule Take 1 capsule (100 mg total) by mouth 2 (two) times daily. 60 capsule 0  . dorzolamide-timolol (COSOPT) 22.3-6.8 MG/ML ophthalmic solution INSTILL ONE DROP INTO BOTH EYES TWICE DAILY    . DULoxetine (CYMBALTA) 30 MG capsule TAKE 2 CAPSULES BY MOUTH EVERY DAY 180 capsule 1  . ferrous sulfate 325 (65 FE) MG tablet TAKE 1 TABLET BY MOUTH EVERY DAY WITH BREAKFAST 90 tablet 1  . gabapentin (NEURONTIN) 300 MG capsule Take 300 mg by mouth 3 (three) times daily.    . insulin glargine (LANTUS SOLOSTAR) 100 UNIT/ML Solostar Pen Inject 20 Units into the skin at bedtime. 15 mL 0  . insulin lispro (HUMALOG) 100 UNIT/ML KwikPen Inject 0.05 mLs (5 Units total) into the skin 3 (three) times daily with meals. 15 mL 11  . latanoprost (XALATAN) 0.005 % ophthalmic solution Place 1 drop into both eyes at bedtime.   3  . lidocaine-prilocaine (EMLA) cream APPLY TO AFFECTED AREA ONCE AS DIRECTED    . Multiple Vitamin (MULTIVITAMIN WITH MINERALS) TABS tablet Take 1 tablet by mouth daily.     . OMEGA-3 FATTY ACIDS PO Take 1,200 mg by mouth daily.     [name] Kitchen omeprazole (PRILOSEC) 40 MG capsule TAKE 1 CAPSULE BY MOUTH EVERY DAY 90 capsule 1  . ONETOUCH VERIO test strip TEST FASTING SUGAR EVERY MORNING 100 strip 3  . rosuvastatin (CRESTOR) 10 MG tablet Take 1 tablet by mouth daily.    [name] Kitchen triamcinolone ointment (KENALOG) 0.5 % APPLY 1 APPLICATION TOPICALLY 3 (THREE) TIMES DAILY. 30 g 0   No current facility-administered medications for this visit.   Facility-Administered Medications Ordered in Other Visits  Medication Dose Route Frequency Provider Last Rate Last Admin  . sodium chloride flush (NS) 0.9 % injection 10 mL  10 mL Intracatheter PRN Earlie Server, MD      . sodium chloride flush (NS) 0.9 % injection 10 mL  10 mL Intravenous Once Earlie Server, MD      . sodium chloride flush (NS)  0.9 % injection 10 mL  10 mL Intravenous Once Earlie Server, MD        VITAL SIGNS: There were no vitals taken for this visit. There were no vitals filed for this visit.  Estimated body mass index is 24.69 kg/m as calculated from the following:   Height as of 09/07/20: $RemoveBefo'5\' 6"'[id number]$  (1.676 m).   Weight as of 09/07/20: 153 lb (69.4 kg).  LABS: CBC:    Component Value Date/Time   WBC 5.7 09/07/2020 1004   HGB 8.5 (L) 09/07/2020 1004   HGB 11.9 (L) 01/29/2020 1429   HCT 27.1 (L) 09/07/2020 1004   HCT 35.8 (L) 01/29/2020 1429   PLT 222 09/07/2020 1004   PLT 187 01/29/2020 1429  MCV 93.1 09/07/2020 1004   MCV 86 01/29/2020 1429   NEUTROABS 3.5 09/07/2020 1004   NEUTROABS 5.0 01/29/2020 1429   LYMPHSABS 0.7 09/07/2020 1004   LYMPHSABS 1.3 01/29/2020 1429   MONOABS 1.2 (H) 09/07/2020 1004   EOSABS 0.2 09/07/2020 1004   EOSABS 0.2 01/29/2020 1429   BASOSABS 0.0 09/07/2020 1004   BASOSABS 0.0 01/29/2020 1429   Comprehensive Metabolic Panel:    Component Value Date/Time   NA 136 09/07/2020 1004   NA 137 01/29/2020 1429   K 3.2 (L) 09/07/2020 1004   CL 103 09/07/2020 1004   CO2 22 09/07/2020 1004   BUN 10 09/07/2020 1004   BUN 20 01/29/2020 1429   CREATININE 0.69 09/07/2020 1004   CREATININE 0.95 06/17/2017 1035   GLUCOSE 118 (H) 09/07/2020 1004   CALCIUM 8.6 (L) 09/07/2020 1004   AST 38 09/07/2020 1004   ALT 19 09/07/2020 1004   ALKPHOS 160 (H) 09/07/2020 1004   BILITOT 0.9 09/07/2020 1004   BILITOT 0.5 01/29/2020 1429   PROT 6.0 (L) 09/07/2020 1004   PROT 6.1 01/29/2020 1429   ALBUMIN 2.7 (L) 09/07/2020 1004   ALBUMIN 3.6 (L) 01/29/2020 1429    RADIOGRAPHIC STUDIES: CT Angio Head W or Wo Contrast  Result Date: 09/07/2020 CLINICAL DATA:  Headache with some numbness on the right side. EXAM: CT ANGIOGRAPHY HEAD AND NECK TECHNIQUE: Multidetector CT imaging of the head and neck was performed using the standard protocol during bolus administration of intravenous contrast.  Multiplanar CT image reconstructions and MIPs were obtained to evaluate the vascular anatomy. Carotid stenosis measurements (when applicable) are obtained utilizing NASCET criteria, using the distal internal carotid diameter as the denominator. CONTRAST:  38mL OMNIPAQUE IOHEXOL 350 MG/ML SOLN COMPARISON:  Same day head CT. FINDINGS: CTA NECK FINDINGS Aortic arch: Extensive calcific and noncalcific atherosclerosis of the partially imaged aorta. Great vessel origins are patent. Right carotid system: Approximately 50-60% stenosis of the right common carotid artery origin. Predominantly calcific atherosclerosis at the carotid bifurcation without greater than 50% narrowing. Left carotid system: The left common carotid artery arises directly from the aorta with severe stenosis secondary atherosclerosis. Mixed calcific and noncalcific atherosclerosis at the carotid bifurcation with approximately 50% stenosis Vertebral arteries: Left dominant. Severe stenosis of the right vertebral artery origin. The right vertebral artery is diminutive and poorly opacified proximally and becomes non-opacified at approximately C2-C3. Moderate atherosclerotic narrowing of the proximal left vertebral artery. Skeleton: Severe degenerative disc disease at C4-C5 C5-C6. No evidence of acute fracture. Other neck: No mass or suspicious adenopathy. Upper chest: Partially imaged opacities in the right upper lobe, similar to prior CT chest. Review of the MIP images confirms the above findings CTA HEAD FINDINGS Anterior circulation: Bilateral petrous and cavernous internal carotid arteries are patent. Bilateral cavernous carotid calcific atherosclerosis without evidence of hemodynamically significant stenosis. Bilateral MCAs are patent without evidence hemodynamically significant proximal stenosis or large vessel occlusion. Right dominant A1 ACA with hypoplastic or absent left A1 ACA. No evidence of hemodynamically significant stenosis or large vessel  occlusion involving the ACAs. No aneurysm. Posterior circulation: Non-opacified right intradural vertebral artery. The left intradural vertebral artery and basilar artery are patent. Bilateral PCAs are opacified proximally. Suspected severe stenosis of the left PCA at the P2/P3 junction with poor opacification of more distal PCA branches. Venous sinuses: As permitted by contrast timing, patent. Small left transverse and sigmoid sinus. Review of the MIP images confirms the above findings IMPRESSION: 1. Intracranially, there is suspected severe stenosis  of the left PCA at the P2/P3 junction with poor opacification of more distal PCA branches. Otherwise, no evidence of large vessel occlusion or hemodynamically significant stenosis intracranially. 2.  Extensive atherosclerosis in the neck, including: - Severe stenosis of the left common carotid origin and approximately 50-60% stenosis of the right common carotid artery origin. - Approximately 50% stenosis of the proximal left cervical ICA. - Severe stenosis of the non dominant right vertebral artery origin with poor opacification of the proximal right vertebral artery and non opacification distally. - Moderate narrowing of the dominant left vertebral artery proximally. 3. Partially imaged bilateral pulmonary emboli and right upper lobe opacities, which were better characterized on recent CTA of the chest from August 29, 2020. Findings were communicated on 09/07/2020 at 10:33 am to provider Dr. Charna Archer Via telephone, who verbally acknowledged these results. Electronically Signed   By: Margaretha Sheffield MD   On: 09/07/2020 10:36   DG Chest 2 View  Result Date: 09/02/2020 CLINICAL DATA:  Tachycardia EXAM: CHEST - 2 VIEW COMPARISON:  None. FINDINGS: The heart size and mediastinal contours are within normal limits. Both lungs are clear. The visualized skeletal structures are unremarkable. Right chest wall Port-A-Cath with tip at the cavoatrial junction. IMPRESSION: No  active cardiopulmonary disease. Electronically Signed   By: Ulyses Jarred M.D.   On: 09/02/2020 03:28   DG Chest 2 View  Result Date: 08/25/2020 CLINICAL DATA:  Shortness of breath. EXAM: CHEST - 2 VIEW COMPARISON:  01/23/2018 FINDINGS: The right IJ Port-A-Cath is in good position. Stable tortuosity, ectasia and calcification of the thoracic aorta. Stable surgical changes from triple bypass surgery. The lungs are clear of an acute process. No pleural effusions or pulmonary edema. The bony thorax is intact. IMPRESSION: No acute cardiopulmonary findings. Electronically Signed   By: Marijo Sanes M.D.   On: 08/25/2020 16:19   DG Abd 1 View  Result Date: 08/25/2020 CLINICAL DATA:  Abdominal pain. EXAM: ABDOMEN - 1 VIEW COMPARISON:  01/03/2014 FINDINGS: There is moderate stool throughout the colon and down into the rectum suggesting constipation. No distended small bowel loops are identified. No free air. The bony structures are intact. Lumbar fusion hardware noted. Vascular calcifications are noted. IMPRESSION: Moderate stool throughout the colon suggesting constipation. Electronically Signed   By: Marijo Sanes M.D.   On: 08/25/2020 16:21   CT Angio Neck W and/or Wo Contrast  Result Date: 09/07/2020 CLINICAL DATA:  Headache with some numbness on the right side. EXAM: CT ANGIOGRAPHY HEAD AND NECK TECHNIQUE: Multidetector CT imaging of the head and neck was performed using the standard protocol during bolus administration of intravenous contrast. Multiplanar CT image reconstructions and MIPs were obtained to evaluate the vascular anatomy. Carotid stenosis measurements (when applicable) are obtained utilizing NASCET criteria, using the distal internal carotid diameter as the denominator. CONTRAST:  23mL OMNIPAQUE IOHEXOL 350 MG/ML SOLN COMPARISON:  Same day head CT. FINDINGS: CTA NECK FINDINGS Aortic arch: Extensive calcific and noncalcific atherosclerosis of the partially imaged aorta. Great vessel origins  are patent. Right carotid system: Approximately 50-60% stenosis of the right common carotid artery origin. Predominantly calcific atherosclerosis at the carotid bifurcation without greater than 50% narrowing. Left carotid system: The left common carotid artery arises directly from the aorta with severe stenosis secondary atherosclerosis. Mixed calcific and noncalcific atherosclerosis at the carotid bifurcation with approximately 50% stenosis Vertebral arteries: Left dominant. Severe stenosis of the right vertebral artery origin. The right vertebral artery is diminutive and poorly opacified proximally and becomes  non-opacified at approximately C2-C3. Moderate atherosclerotic narrowing of the proximal left vertebral artery. Skeleton: Severe degenerative disc disease at C4-C5 C5-C6. No evidence of acute fracture. Other neck: No mass or suspicious adenopathy. Upper chest: Partially imaged opacities in the right upper lobe, similar to prior CT chest. Review of the MIP images confirms the above findings CTA HEAD FINDINGS Anterior circulation: Bilateral petrous and cavernous internal carotid arteries are patent. Bilateral cavernous carotid calcific atherosclerosis without evidence of hemodynamically significant stenosis. Bilateral MCAs are patent without evidence hemodynamically significant proximal stenosis or large vessel occlusion. Right dominant A1 ACA with hypoplastic or absent left A1 ACA. No evidence of hemodynamically significant stenosis or large vessel occlusion involving the ACAs. No aneurysm. Posterior circulation: Non-opacified right intradural vertebral artery. The left intradural vertebral artery and basilar artery are patent. Bilateral PCAs are opacified proximally. Suspected severe stenosis of the left PCA at the P2/P3 junction with poor opacification of more distal PCA branches. Venous sinuses: As permitted by contrast timing, patent. Small left transverse and sigmoid sinus. Review of the MIP images  confirms the above findings IMPRESSION: 1. Intracranially, there is suspected severe stenosis of the left PCA at the P2/P3 junction with poor opacification of more distal PCA branches. Otherwise, no evidence of large vessel occlusion or hemodynamically significant stenosis intracranially. 2.  Extensive atherosclerosis in the neck, including: - Severe stenosis of the left common carotid origin and approximately 50-60% stenosis of the right common carotid artery origin. - Approximately 50% stenosis of the proximal left cervical ICA. - Severe stenosis of the non dominant right vertebral artery origin with poor opacification of the proximal right vertebral artery and non opacification distally. - Moderate narrowing of the dominant left vertebral artery proximally. 3. Partially imaged bilateral pulmonary emboli and right upper lobe opacities, which were better characterized on recent CTA of the chest from August 29, 2020. Findings were communicated on 09/07/2020 at 10:33 am to provider Dr. Charna Archer Via telephone, who verbally acknowledged these results. Electronically Signed   By: Margaretha Sheffield MD   On: 09/07/2020 10:36   MR BRAIN WO CONTRAST  Addendum Date: 09/07/2020   ADDENDUM REPORT: 09/07/2020 14:42 ADDENDUM: Findings discussed with Dr. Alexis Goodell via telephone at 2:40 PM. Electronically Signed   By: Margaretha Sheffield MD   On: 09/07/2020 14:42   Result Date: 09/07/2020 EXAM: MRI HEAD WITHOUT CONTRAST TECHNIQUE: Multiplanar, multiecho pulse sequences of the brain and surrounding structures were obtained without intravenous contrast. COMPARISON:  Same day CT exams. FINDINGS: Brain: Acute left PCA territory infarct which involves the left occipital lobe, left hippocampus, and left thalamus. Mild associated edema without substantial mass effect. Additionally, there small infarcts in the left frontal lobe, left caudate, and right cerebellar hemisphere. No hydrocephalus. No mass lesion or suspect antral  mass effect. No extra-axial fluid collection. Vascular: Further evaluated on same day CTA Skull and upper cervical spine: Normal marrow signal. Sinuses/Orbits: No acute finding. Other: No mastoid effusions. IMPRESSION: 1. Acute left PCA territory infarct which involves the left occipital lobe, left hippocampus, and left thalamus. Mild associated edema without substantial mass effect. 2. Additionally, there small infarcts in the left frontal lobe, left caudate, and right cerebellar hemisphere. Given involvement of multiple vascular territories, consider centrum embolic source. Electronically Signed: By: Margaretha Sheffield MD On: 09/07/2020 14:13   CT CHEST ABDOMEN PELVIS W CONTRAST  Result Date: 08/31/2020 CLINICAL DATA:  Metastatic pancreatic cancer. Shortness of breath. EXAM: CT CHEST, ABDOMEN, AND PELVIS WITH CONTRAST TECHNIQUE: Multidetector CT imaging of  the chest, abdomen and pelvis was performed following the standard protocol during bolus administration of intravenous contrast. CONTRAST:  163mL OMNIPAQUE IOHEXOL 300 MG/ML  SOLN COMPARISON:  CT scan 06/04/2020 FINDINGS: CT CHEST FINDINGS Cardiovascular: The heart is normal in size. No pericardial effusion. Stable tortuosity, mild ectasia and calcification of the thoracic aorta. Stable branch vessel calcifications including three-vessel coronary artery calcifications. Stable surgical changes from bypass surgery. Mediastinum/Nodes: There are bilateral pulmonary emboli. No evidence of right heart strain. The RV LV ratio is less than 1. Small scattered mediastinal and hilar lymph nodes are stable. None of these measures more than 8 mm. Lungs/Pleura: Stable 7 mm irregular nodule at the left lung base. 3 mm subpleural pulmonary nodule along the major fissure on image 85/3 is slightly more pronounced but I think this is due to breathing motion artifact. Similar finding involving the 3 mm nodule along the right minor fissure. 5 mm subpleural nodule in the right  upper lobe on image number 72/3 previously measured 3 mm. Subpleural atelectasis noted in the right upper lobe posteriorly which may be related to the patient's pulmonary embolism. No pleural effusions. Musculoskeletal: No significant bony findings. No evidence of osseous metastatic disease. CT ABDOMEN PELVIS FINDINGS Hepatobiliary: Progressive hepatic metastatic disease. Enlarging/coalescing mass in segment 8 on image 53/2 measures 5.9 x 5.5 cm and previously measured approximately 4.4 x 4.2 cm. Segment 7 lesion on image 51/2 measures 6.5 x 4.7 cm and previously measured 4.6 x 3.6 cm. Segment 6 lesion on image 63/2 measures 5.5 x 4.5 cm and previously measured 4.4 x 3.4 cm. Pancreas: Pancreatic head mass measures 2.8 x 2.7 cm and previously measured 3.1 x 2.9 cm. Marked atrophy of the remainder of the gland is again noted. Spleen: Normal size. No focal lesions. Adrenals/Urinary Tract: The adrenal glands and kidneys are stable. Bilateral renal cysts are noted. The midpole left renal cyst posteriorly is partially hemorrhagic. Stomach/Bowel: The stomach, duodenum, small bowel and colon are grossly normal without oral contrast. No inflammatory changes, mass lesions or obstructive findings. The appendix is normal. Vascular/Lymphatic: Severe atherosclerotic calcifications involving the aorta, iliac arteries and branch vessels. The right internal iliac artery is occluded proximally. No mesenteric or retroperitoneal mass or adenopathy. Reproductive: The prostate gland and seminal vesicles are unremarkable. Other: No pelvic mass or adenopathy. No free pelvic fluid collections. No inguinal mass or adenopathy. No abdominal wall hernia or subcutaneous lesions. Musculoskeletal: No significant bony findings. Remote lumbar fusion changes. IMPRESSION: 1. Bilateral pulmonary emboli. No evidence of right heart strain. 2. Progressive hepatic metastatic disease. 3. Slight interval decrease in size of the pancreatic head mass. 4.  Stable advanced atherosclerotic calcifications involving the thoracic and abdominal aorta and branch vessels including the coronary arteries. 5. Stable pulmonary nodules. 6. Aortic atherosclerosis. These results will be called to the ordering clinician or representative by the Radiologist Assistant, and communication documented in the PACS or [employer]. Aortic Atherosclerosis (ICD10-I70.0). Electronically Signed   By: Marijo Sanes M.D.   On: 08/31/2020 15:05   US Venous Img Lower Bilateral (DVT)  Result Date: 09/02/2020 CLINICAL DATA:  74 year old male with a history prior pulmonary embolism EXAM: BILATERAL LOWER EXTREMITY VENOUS DOPPLER ULTRASOUND TECHNIQUE: Gray-scale sonography with graded compression, as well as color Doppler and duplex ultrasound were performed to evaluate the lower extremity deep venous systems from the level of the common femoral vein and including the common femoral, femoral, profunda femoral, popliteal and calf veins including the posterior tibial, peroneal and gastrocnemius veins when visible.  The superficial great saphenous vein was also interrogated. Spectral Doppler was utilized to evaluate flow at rest and with distal augmentation maneuvers in the common femoral, femoral and popliteal veins. COMPARISON:  None. FINDINGS: RIGHT LOWER EXTREMITY Common Femoral Vein: No evidence of thrombus. Normal compressibility, respiratory phasicity and response to augmentation. Saphenofemoral Junction: No evidence of thrombus. Normal compressibility and flow on color Doppler imaging. Profunda Femoral Vein: No evidence of thrombus. Normal compressibility and flow on color Doppler imaging. Femoral Vein: No evidence of thrombus. Normal compressibility, respiratory phasicity and response to augmentation. Popliteal Vein: Partially compressible popliteal vein without occlusion. Calf Veins: No evidence of thrombus. Normal compressibility and flow on color Doppler imaging. Superficial Great  Saphenous Vein: No evidence of thrombus. Normal compressibility and flow on color Doppler imaging. Other Findings:  None. LEFT LOWER EXTREMITY Common Femoral Vein: No evidence of thrombus. Normal compressibility, respiratory phasicity and response to augmentation. Saphenofemoral Junction: No evidence of thrombus. Normal compressibility and flow on color Doppler imaging. Profunda Femoral Vein: No evidence of thrombus. Normal compressibility and flow on color Doppler imaging. Femoral Vein: No evidence of thrombus. Normal compressibility, respiratory phasicity and response to augmentation. Popliteal Vein: No evidence of thrombus. Normal compressibility, respiratory phasicity and response to augmentation. Calf Veins: No evidence of thrombus. Normal compressibility and flow on color Doppler imaging. Superficial Great Saphenous Vein: No evidence of thrombus. Normal compressibility and flow on color Doppler imaging. Other Findings: Lentiform heterogeneously echoic soft tissue within the thigh measures 4.1 cm x 1.6 cm x 5.1 cm. No internal color flow. IMPRESSION: Sonographic survey of the right lower extremity positive for nonocclusive DVT of the popliteal vein. Sonographic survey of the left lower extremity negative for DVT. Lentiform soft tissue lesion in the left thigh measures 5.1 cm. Ultrasound characteristics are nonspecific, and this may represent a lipoma. Note that low-grade liposarcoma cannot be excluded on ultrasound. Correlation with patient presentation may be useful, as well as consideration of MRI if indicated. Electronically Signed   By: Corrie Mckusick D.O.   On: 09/02/2020 11:51   ECHOCARDIOGRAM COMPLETE  Result Date: 09/03/2020    ECHOCARDIOGRAM REPORT   Patient Name:   Dayson LEAMER Date of Exam: 09/02/2020 Medical Rec #:  [id number]       Height:       69.0 in Accession #:    [id number]      Weight:       160.0 lb Date of Birth:  [birth year]       BSA:          1.879 m Patient Age:    13 years         BP:           165/84 mmHg Patient Gender: M               HR:           85 bpm. Exam Location:  [facility] Procedure: 2D Echo, Color Doppler and Cardiac Doppler Indications:     I26.99 Pulmonary Embolus  History:         Patient has prior history of Echocardiogram examinations. CHF,                  CAD, Prior CABG; Risk Factors:Hypertension and Dyslipidemia.  Sonographer:     Charmayne Sheer RDCS (AE) Referring Phys:  [id number] Ivor Costa Diagnosing Phys: Yolonda Kida MD  Sonographer Comments: Suboptimal subcostal window. IMPRESSIONS  1. Left ventricular ejection fraction, by estimation, is 55 to 60%.  The left ventricle has normal function. The left ventricle has no regional wall motion abnormalities. Left ventricular diastolic parameters are consistent with Grade I diastolic dysfunction (impaired relaxation).  2. Right ventricular systolic function is normal. The right ventricular size is normal.  3. The mitral valve is normal in structure. No evidence of mitral valve regurgitation.  4. The aortic valve is grossly normal. There is mild calcification of the aortic valve. There is mild thickening of the aortic valve. Aortic valve regurgitation is not visualized. Mild to moderate aortic valve sclerosis/calcification is present, without  any evidence of aortic stenosis. FINDINGS  Left Ventricle: Left ventricular ejection fraction, by estimation, is 55 to 60%. The left ventricle has normal function. The left ventricle has no regional wall motion abnormalities. The left ventricular internal cavity size was normal in size. There is  no left ventricular hypertrophy. Left ventricular diastolic parameters are consistent with Grade I diastolic dysfunction (impaired relaxation). Right Ventricle: The right ventricular size is normal. No increase in right ventricular wall thickness. Right ventricular systolic function is normal. Left Atrium: Left atrial size was normal in size. Right Atrium: Right atrial size was normal in size.  Pericardium: There is no evidence of pericardial effusion. Mitral Valve: The mitral valve is normal in structure. No evidence of mitral valve regurgitation. MV peak gradient, 5.0 mmHg. The mean mitral valve gradient is 2.0 mmHg. Tricuspid Valve: The tricuspid valve is normal in structure. Tricuspid valve regurgitation is not demonstrated. Aortic Valve: The aortic valve is grossly normal. There is mild calcification of the aortic valve. There is mild thickening of the aortic valve. There is mild aortic valve annular calcification. Aortic valve regurgitation is not visualized. Mild to moderate aortic valve sclerosis/calcification is present, without any evidence of aortic stenosis. Aortic valve mean gradient measures 3.0 mmHg. Aortic valve peak gradient measures 5.3 mmHg. Aortic valve area, by VTI measures 1.71 cm. Pulmonic Valve: The pulmonic valve was grossly normal. Pulmonic valve regurgitation is not visualized. Aorta: The ascending aorta was not well visualized. IAS/Shunts: No atrial level shunt detected by color flow Doppler.  LEFT VENTRICLE PLAX 2D LVIDd:         4.06 cm  Diastology LVIDs:         2.85 cm  LV e' medial:    5.33 cm/s LV PW:         0.97 cm  LV E/e' medial:  14.0 LV IVS:        0.78 cm  LV e' lateral:   8.05 cm/s LVOT diam:     2.00 cm  LV E/e' lateral: 9.3 LV SV:         33 LV SV Index:   17 LVOT Area:     3.14 cm  RIGHT VENTRICLE RV Basal diam:  2.50 cm RV Mid diam:    3.64 cm LEFT ATRIUM           Index       RIGHT ATRIUM          Index LA diam:      2.90 cm 1.54 cm/m  RA Area:     8.63 cm LA Vol (A2C): 26.3 ml 14.00 ml/m RA Volume:   13.40 ml 7.13 ml/m LA Vol (A4C): 24.5 ml 13.04 ml/m  AORTIC VALVE                   PULMONIC VALVE AV Area (Vmax):    1.87 cm    PV Vmax:  0.67 m/s AV Area (Vmean):   1.89 cm    PV Vmean:      50.000 cm/s AV Area (VTI):     1.71 cm    PV VTI:        0.118 m AV Vmax:           115.00 cm/s PV Peak grad:  1.8 mmHg AV Vmean:          76.500 cm/s PV  Mean grad:  1.0 mmHg AV VTI:            0.191 m AV Peak Grad:      5.3 mmHg AV Mean Grad:      3.0 mmHg LVOT Vmax:         68.50 cm/s LVOT Vmean:        46.100 cm/s LVOT VTI:          0.104 m LVOT/AV VTI ratio: 0.54  AORTA Ao Root diam: 3.10 cm MITRAL VALVE MV Area (PHT): 5.20 cm     SHUNTS MV Peak grad:  5.0 mmHg     Systemic VTI:  0.10 m MV Mean grad:  2.0 mmHg     Systemic Diam: 2.00 cm MV Vmax:       1.12 m/s MV Vmean:      59.6 cm/s MV Decel Time: 146 msec MV E velocity: 74.60 cm/s MV A velocity: 110.00 cm/s MV E/A ratio:  0.68 Dwayne D Callwood MD Electronically signed by Yolonda Kida MD Signature Date/Time: 09/03/2020/4:31:23 PM    Final    CT HEAD CODE STROKE WO CONTRAST  Result Date: 09/07/2020 CLINICAL DATA:  Code stroke. Neuro deficit, acute stroke suspected. Right-sided weakness and headache. EXAM: CT HEAD WITHOUT CONTRAST TECHNIQUE: Contiguous axial images were obtained from the base of the skull through the vertex without intravenous contrast. COMPARISON:  None. FINDINGS: Brain: No evidence of acute large vascular territory infarction, hemorrhage, hydrocephalus, extra-axial collection or mass lesion/mass effect. Patchy white matter hypoattenuation, nonspecific but most likely secondary to chronic microvascular ischemic disease. Vascular: Intracranial calcific atherosclerosis. No hyperdense vessel identified. Skull: No acute fracture. Sinuses/Orbits: No acute findings. Other: No mastoid effusions. ASPECTS [facility] Stroke Program Early CT Score) total score (0-10 with 10 being normal): 10. IMPRESSION: No evidence of acute intracranial abnormality.  ASPECTS is 10. Code stroke imaging results were communicated on 09/07/2020 at 9:54 am to provider Dr. Charna Archer via telephone, who verbally acknowledged these results. Electronically Signed   By: Margaretha Sheffield MD   On: 09/07/2020 09:57    PERFORMANCE STATUS (ECOG) : 2 - Symptomatic, <50% confined to bed  Review of Systems Unless otherwise  noted, a complete review of systems is negative.  Physical Exam General: NAD, frail appearing Cardiovascular: regular rate and rhythm Pulmonary: clear ant fields Abdomen: soft, nontender, + bowel sounds GU: no suprapubic tenderness Extremities: no edema, no joint deformities Skin: no rashes Neurological: Weakness, some confusion, A&O x 2  Assessment and Plan- Patient is a 73 y.o. 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 He presents to the [facility] today for evaluation of dehydration due to poor oral intake.  Patient was accompanied by his sister.  Dehydration -will give 1 L normal saline.  Patient was able to drink oral fluids in the clinic today.  I suspect that he is not remembering to drink following his CVA.  Discussed with family to ensure the patient has access and encourages him to  drink oral fluids.  Weakness -patient declined rehab in the hospital, opting instead to go home with home health.  Home health has not started.  Today, patient asks about going to rehab.  Unfortunately, our options to refer to rehab from the clinic are limited.  Goals of care -patient has had progressive cancer despite multiple lines of treatment.  Most recently his transaminases and total bili are slightly elevated, likely secondary to progressive hepatic metastasis.  I attempted to discuss goals today with patient but he had extremely poor insight into his cancer.  We discussed the option of hospice in detail, which I think would be clinically appropriate at this point.  Note that his sister's husband recently died under hospice care and she thinks that that would be the best decision.  Given patient's confusion following his recent CVA, will plan to bring patient in later this week with his wife to have a more in-depth conversation regarding goals.  I believe that his wife may have to facilitate decision-making at this point given  patient's poor insight and confusion.   Case and plan discussed with Dr. Tasia Catchings   Patient expressed understanding and was in agreement with this plan. He also understands that He can call clinic at any time with any questions, concerns, or complaints.   Thank you for allowing me to participate in the care of this very pleasant patient.   Time Total: 45 minutes  Visit consisted of counseling and education dealing with the complex and emotionally intense issues of symptom management and palliative care in the setting of serious and potentially life-threatening illness.Greater than 50%  of this time was spent counseling and coordinating care related to the above assessment and plan.  Signed by: Altha Harm, PhD, NP-C

## 2020-09-10 ENCOUNTER — Inpatient Hospital Stay: Payer: Medicare Other

## 2020-09-10 LAB — CANCER ANTIGEN 19-9: CA 19-9: 98399 U/mL — ABNORMAL HIGH (ref 0–35)

## 2020-09-12 ENCOUNTER — Inpatient Hospital Stay (HOSPITAL_BASED_OUTPATIENT_CLINIC_OR_DEPARTMENT_OTHER): Payer: Medicare Other | Admitting: Hospice and Palliative Medicine

## 2020-09-12 ENCOUNTER — Inpatient Hospital Stay: Payer: Medicare Other | Attending: Oncology | Admitting: Oncology

## 2020-09-12 ENCOUNTER — Encounter: Payer: Self-pay | Admitting: Oncology

## 2020-09-12 VITALS — BP 123/81 | HR 97 | Temp 98.0°F

## 2020-09-12 DIAGNOSIS — I10 Essential (primary) hypertension: Secondary | ICD-10-CM | POA: Diagnosis not present

## 2020-09-12 DIAGNOSIS — Z515 Encounter for palliative care: Secondary | ICD-10-CM | POA: Diagnosis not present

## 2020-09-12 DIAGNOSIS — K219 Gastro-esophageal reflux disease without esophagitis: Secondary | ICD-10-CM | POA: Diagnosis not present

## 2020-09-12 DIAGNOSIS — R0602 Shortness of breath: Secondary | ICD-10-CM | POA: Insufficient documentation

## 2020-09-12 DIAGNOSIS — F17210 Nicotine dependence, cigarettes, uncomplicated: Secondary | ICD-10-CM | POA: Insufficient documentation

## 2020-09-12 DIAGNOSIS — Z836 Family history of other diseases of the respiratory system: Secondary | ICD-10-CM | POA: Insufficient documentation

## 2020-09-12 DIAGNOSIS — C787 Secondary malignant neoplasm of liver and intrahepatic bile duct: Secondary | ICD-10-CM | POA: Diagnosis not present

## 2020-09-12 DIAGNOSIS — I2699 Other pulmonary embolism without acute cor pulmonale: Secondary | ICD-10-CM | POA: Diagnosis not present

## 2020-09-12 DIAGNOSIS — Z7189 Other specified counseling: Secondary | ICD-10-CM

## 2020-09-12 DIAGNOSIS — R109 Unspecified abdominal pain: Secondary | ICD-10-CM | POA: Diagnosis not present

## 2020-09-12 DIAGNOSIS — Z79899 Other long term (current) drug therapy: Secondary | ICD-10-CM | POA: Insufficient documentation

## 2020-09-12 DIAGNOSIS — C259 Malignant neoplasm of pancreas, unspecified: Secondary | ICD-10-CM | POA: Diagnosis not present

## 2020-09-12 DIAGNOSIS — I700 Atherosclerosis of aorta: Secondary | ICD-10-CM | POA: Diagnosis not present

## 2020-09-12 DIAGNOSIS — Z808 Family history of malignant neoplasm of other organs or systems: Secondary | ICD-10-CM | POA: Insufficient documentation

## 2020-09-12 DIAGNOSIS — C7800 Secondary malignant neoplasm of unspecified lung: Secondary | ICD-10-CM | POA: Insufficient documentation

## 2020-09-12 DIAGNOSIS — I639 Cerebral infarction, unspecified: Secondary | ICD-10-CM

## 2020-09-12 DIAGNOSIS — R000 Tachycardia, unspecified: Secondary | ICD-10-CM | POA: Insufficient documentation

## 2020-09-12 DIAGNOSIS — Z809 Family history of malignant neoplasm, unspecified: Secondary | ICD-10-CM | POA: Insufficient documentation

## 2020-09-12 MED ORDER — APIXABAN 5 MG PO TABS: 5.0000 mg | ORAL_TABLET | Freq: Two times a day (BID) | ORAL | 2 refills | Status: AC

## 2020-09-12 NOTE — Progress Notes (Signed)
Hematology/Oncology  Follow up note [facility] [phone] [phone]   Patient Care Team: Craig [name] as PCP - General (Physician Assistant) Craig Jacks, RN as Oncology Nurse Navigator Craig Server, MD as Consulting Physician (Hematology and Oncology) Craig Labella, RN as Case Manager Craig Farber, MD as Consulting Physician (Internal Medicine)  REFERRING PROVIDER: Trinna Post, PA-C  CHIEF COMPLAINTS/REASON FOR VISIT:  Follow up for treatment of pancreatic cancer  HISTORY OF PRESENTING ILLNESS:   Craig [name] is a  74 y.o.  male with PMH listed below, including CABG x5, PVD, hypertension, former tobacco abuse, former alcohol abuse, iron deficiency anemia, was seen in consultation at the request of  Terrilee Croak, Adriana M, PA-C  for evaluation of abnormal CT Patient recently presented to primary care provider complaining for abdominal pain radiating to his back for about 10 days.  Patient uses Tylenol as needed for pain. Work-up showed elevated amylase, lipase, mild anemia with hemoglobin of 11.3 Acute hepatitis panel negative. 05/16/2019 CT abdomen pelvis with contrast showed poorly marginated heterogeneous hypodense 2.9 cm pancreatic mass at the uncinate process, worrisome for primary pancreatic cancer.  No biliary or pancreatic duct dilation. Several ill defined hypodense liver masses scattered throughout the liver, largest 3.1 cm in the segment 6 right liver lobe. Nonspecific portacaval and aortocaval adenopathy. Extreme medial left lung base 4.4 cm pleural-based mass probably a pleural metastasis.  Additional tiny pulmonary nodules scattered at the right lung base are indeterminate. Mild prostate or megaly  He had a history of 37.5-pack-year smoking history quitting 2018. No longer drinking alcohol.  # ultrasound-guided liver biopsy on 05/21/2019.  Pathology showed fragments of benign hepatic parenchyma showing  minimal macro vascular steatosis, otherwise no significant histo pathologic change. Single core of renal tissue showing approximately 25% glomerulosclerosis  # #History of CAD, status [name] CABG x5.  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  # Omniseq test showed PD-L1 50% TPS, TMB high, negative for BRCA1/2 or NTRK fusion.  MSI could not be completed.  Patient declines genetic testing # 06/07/2019 -1st line chemotherapy Gemcitabine and abraxane. 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 # 11/14/2019- progression as well as profound weekend fatigue, 2nd line treatment with keytruda # 01/07/2020 -progression, start third line chemotherapy treatment 5-FU with liposomal irinotecan 01/24/2020-01/26/2020 patient was admitted to ICU due to DKA. on long-acting insulin 20 units at night and takes sliding scale with meals.  INTERVAL HISTORY [name] is a 74 y.o. male who has above history reviewed by me today presents for evaluation of chemotherapy tolerability for metastatic pancreatic cancer.   Problems and complaints are listed below: 09/02/2020-09/04/2020 admitted due to acute pulmonary embolism found on restaging CT.  Started on Eliquis starter kit.  Patient was discharged. 09/07/2020-09/08/2020, patient was admitted due to acute left PCA territory infarct.  Patient was seen by me during the admission.  Please refer to consultation note.  Patient sister had called this week concerning patient was not eating well, progressively weak.  Patient was seen by nurse practitioner, patient was given IV fluid for hydration. Today patient presents to discuss further management plan for metastatic pancreatic cancer.  He was accompanied by his wife and sister. Patient does not provide much history today.  Per wife and sister, patient had experienced decline of his cognitive  judgment, decreased memory since the stroke.  Patient reports feeling weak  and have no appetite.  He denies any pain today.  He has lived in sister's home for a couple of days since the discharge and the plan is for patient go back home to live with wife.  Per sister, patient stays in the couch for most of the daytime.  Reports that patient has been dragging his right lower extremity, unsteady.    Review of Systems  Constitutional: Positive for appetite change, fatigue and unexpected weight change. Negative for chills and fever.  HENT:   Negative for hearing loss and voice change.   Eyes: Negative for eye problems and icterus.  Respiratory: Negative for chest tightness, cough and shortness of breath.   Cardiovascular: Negative for chest pain and leg swelling.  Gastrointestinal: Negative for abdominal distention and abdominal pain.  Endocrine: Negative for hot flashes.  Genitourinary: Negative for difficulty urinating, dysuria and frequency.   Musculoskeletal: Negative for arthralgias.  Skin: Negative for itching and rash.  Neurological: Positive for numbness. Negative for light-headedness.  Hematological: Negative for adenopathy. Does not bruise/bleed easily.  Psychiatric/Behavioral: Negative for confusion.    MEDICAL HISTORY:  Past Medical History:  Diagnosis Date  . Allergic rhinitis   . CHF (congestive heart failure) ([place])   . Chronic cholecystitis   . Coronary artery disease   . DDD (degenerative disc disease), cervical   . Dehydration 06/21/2019  . Diabetes mellitus without complication ([place])   . Dyspnea   . Early cataract   . Erectile dysfunction   . Family history of brain cancer   . Family history of cancer   . GERD (gastroesophageal reflux disease)   . Gouty arthritis   . Headache    occasional migraines  . Hypercalcemia   . Hyperlipemia   . Hypertension   . Palpitations   . Pancreatic cancer metastasized to liver ([place]) 05/2019   Chemo tx's  . Peripheral vascular  disease ([place])   . 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   LIMA to LAD SVG to [place] SVG SEQUENTIALLY to OM1 and OM2 SVG to ACUTE MARGINAL  . Spinal stenosis of cervical region   . Vitamin D deficiency     SURGICAL HISTORY: Past Surgical History:  Procedure Laterality Date  . BACK SURGERY  2018    fusion with screws  . CHOLECYSTECTOMY N/A 11/13/2018   Procedure: LAPAROSCOPIC CHOLECYSTECTOMY;  Surgeon: Vickie Epley, MD;  Location: [facility];  Service: General;  Laterality: N/A;  . COLONOSCOPY    . CORONARY ARTERY BYPASS GRAFT N/A 09/30/2017   Procedure: CORONARY ARTERY BYPASS GRAFTING times five using right and left Saphaneous vein harvested endoscopicly  and left internal mammary artery. (CABG),TEE;  Surgeon: Rexene Alberts, MD;  Location: [place];  Service: Open Heart Surgery;  Laterality: N/A;  . LEFT HEART CATH AND CORONARY ANGIOGRAPHY Left 09/06/2017   Procedure: LEFT HEART CATH AND CORONARY ANGIOGRAPHY;  Surgeon: Corey Skains, MD;  Location: [place] [facility];  Service: Cardiovascular;  Laterality: Left;  . percutaneous transluminal balloon angioplasty  01/2010   of left lower extremity  . PORTA CATH INSERTION N/A 06/06/2019   Procedure: PORTA CATH INSERTION;  Surgeon: Katha Cabal, MD;  Location: [place] [facility];  Service: Cardiovascular;  Laterality: N/A;  . TEE WITHOUT CARDIOVERSION N/A 09/30/2017   Procedure: TRANSESOPHAGEAL ECHOCARDIOGRAM (TEE);  Surgeon: Rexene Alberts, MD;  Location: [place];  Service: Open Heart Surgery;  Laterality: N/A;  . VASCULAR SURGERY Left 2010   left exernal iliac and superficial femoral artery  PTCA and stenting    SOCIAL HISTORY: Social History   Socioeconomic History  . Marital status: Married    Spouse name: Enid Derry  . Number of children: 2  . Years of education: Not on file  . Highest education level: 5th grade  Occupational History  . Occupation: drove tractors    Comment: retired  Tobacco Use  . Smoking status:  Former Smoker    Packs/day: 0.75    Years: 50.00    Pack years: 37.50    Types: Cigarettes    Quit date: 09/27/2017    Years since quitting: 2.9  . Smokeless tobacco: Former Systems developer    Types: Secondary school teacher  . Vaping Use: Never used  Substance and Sexual Activity  . Alcohol use: Not Currently    Comment: stopped drinking before cabg  . Drug use: Not Currently    Types: Marijuana    Comment: stopped smoking before CABG  . Sexual activity: Not Currently  Other Topics Concern  . Not on file  Social History Narrative  . Not on file   Social Determinants of Health   Financial Resource Strain: Low Risk   . Difficulty of Paying Living Expenses: Not hard at all  Food Insecurity: No Food Insecurity  . Worried About Charity fundraiser in the Last Year: Never true  . Ran Out of Food in the Last Year: Never true  Transportation Needs: No Transportation Needs  . Lack of Transportation (Medical): No  . Lack of Transportation (Non-Medical): No  Physical Activity: Inactive  . Days of Exercise per Week: 0 days  . Minutes of Exercise per Session: 0 min  Stress: No Stress Concern Present  . Feeling of Stress : Not at all  Social Connections: Moderately Isolated  . Frequency of Communication with Friends and Family: More than three times a week  . Frequency of Social Gatherings with Friends and Family: More than three times a week  . Attends Religious Services: Never  . Active Member of Clubs or Organizations: No  . Attends Archivist Meetings: Never  . Marital Status: Married  Human resources officer Violence: Not At Risk  . Fear of Current or Ex-Partner: No  . Emotionally Abused: No  . Physically Abused: No  . Sexually Abused: No    FAMILY HISTORY: Family History  Problem Relation Age of Onset  . Brain cancer Mother   . Emphysema Father   . Cancer Brother     ALLERGIES:  is allergic to lipitor [atorvastatin], zetia [ezetimibe], lisinopril, protonix [pantoprazole], and  spironolactone.  MEDICATIONS:  Current Outpatient Medications  Medication Sig Dispense Refill  . acetaminophen (TYLENOL) 325 MG tablet Take 2 tablets (650 mg total) by mouth every 6 (six) hours as needed for fever, headache or moderate pain.    [name] Kitchen aspirin EC 81 MG tablet Take 81 mg by mouth daily.    . capecitabine (XELODA) 500 MG tablet Take 3 tablets (1,500 mg total) by mouth 2 (two) times daily after a meal. Take for 14 days on, 7 days off. Repeat every 21 days. 84 tablet 0  . carvedilol (COREG) 6.25 MG tablet Take 6.25 mg by mouth 2 (two) times daily with a meal.  3  . docusate sodium (COLACE) 100 MG capsule Take 1 capsule (100 mg total) by mouth 2 (two) times daily. 60 capsule 0  . dorzolamide-timolol (COSOPT) 22.3-6.8 MG/ML ophthalmic solution INSTILL ONE DROP INTO BOTH EYES TWICE DAILY    . DULoxetine (CYMBALTA) 30 MG  capsule TAKE 2 CAPSULES BY MOUTH EVERY DAY 180 capsule 1  . ferrous sulfate 325 (65 FE) MG tablet TAKE 1 TABLET BY MOUTH EVERY DAY WITH BREAKFAST 90 tablet 1  . gabapentin (NEURONTIN) 300 MG capsule Take 300 mg by mouth 3 (three) times daily.    [name] Kitchen latanoprost (XALATAN) 0.005 % ophthalmic solution Place 1 drop into both eyes at bedtime.   3  . lidocaine-prilocaine (EMLA) cream APPLY TO AFFECTED AREA ONCE AS DIRECTED    . Multiple Vitamin (MULTIVITAMIN WITH MINERALS) TABS tablet Take 1 tablet by mouth daily.     [name] Kitchen omeprazole (PRILOSEC) 40 MG capsule TAKE 1 CAPSULE BY MOUTH EVERY DAY 90 capsule 1  . ONETOUCH VERIO test strip TEST FASTING SUGAR EVERY MORNING 100 strip 3  . triamcinolone ointment (KENALOG) 0.5 % APPLY 1 APPLICATION TOPICALLY 3 (THREE) TIMES DAILY. 30 g 0  . apixaban (ELIQUIS) 5 MG TABS tablet Take 1 tablet (5 mg total) by mouth 2 (two) times daily. 60 tablet 2  . insulin glargine (LANTUS SOLOSTAR) 100 UNIT/ML Solostar Pen Inject 20 Units into the skin at bedtime. (Patient not taking: Reported on 09/09/2020) 15 mL 0  . insulin lispro (HUMALOG) 100 UNIT/ML KwikPen  Inject 0.05 mLs (5 Units total) into the skin 3 (three) times daily with meals. (Patient not taking: Reported on 09/09/2020) 15 mL 11  . OMEGA-3 FATTY ACIDS PO Take 1,200 mg by mouth daily.  (Patient not taking: Reported on 09/12/2020)    . rosuvastatin (CRESTOR) 10 MG tablet Take 1 tablet by mouth daily. (Patient not taking: Reported on 09/12/2020)     No current facility-administered medications for this visit.   Facility-Administered Medications Ordered in Other Visits  Medication Dose Route Frequency Provider Last Rate Last Admin  . sodium chloride flush (NS) 0.9 % injection 10 mL  10 mL Intracatheter PRN Craig Server, MD      . sodium chloride flush (NS) 0.9 % injection 10 mL  10 mL Intravenous Once Craig Server, MD         PHYSICAL EXAMINATION: ECOG PERFORMANCE STATUS: 2 - Symptomatic, <50% confined to bed Vitals:   09/12/20 0915  BP: 123/81  Pulse: 97  Temp: 98 F (36.7 C)   There were no vitals filed for this visit.  Physical Exam Constitutional:      General: He is not in acute distress.    Comments: Patient sits in the wheelchair  HENT:     Head: Normocephalic and atraumatic.  Eyes:     General: No scleral icterus. Cardiovascular:     Rate and Rhythm: Normal rate and regular rhythm.     Heart sounds: Normal heart sounds.  Pulmonary:     Effort: Pulmonary effort is normal. No respiratory distress.     Breath sounds: No wheezing.  Abdominal:     General: Bowel sounds are normal. There is no distension.     Palpations: Abdomen is soft.  Musculoskeletal:        General: No deformity. Normal range of motion.     Cervical back: Normal range of motion and neck supple.  Skin:    General: Skin is warm and dry.     Findings: No erythema or rash.  Neurological:     Mental Status: He is alert and oriented to person, place, and time.     Cranial Nerves: No cranial nerve deficit.     Comments: Patient appears to be very forgetful.   Psychiatric:  Mood and Affect: Mood  normal.     LABORATORY DATA:  I have reviewed the data as listed Lab Results  Component Value Date   WBC 8.4 09/09/2020   HGB 10.1 (L) 09/09/2020   HCT 33.0 (L) 09/09/2020   MCV 93.8 09/09/2020   PLT 285 09/09/2020   Recent Labs    06/18/20 0926 06/18/20 0926 06/24/20 0826 06/24/20 0826 07/08/20 0841 07/22/20 0831 09/02/20 0237 09/07/20 1004 09/09/20 1241  NA 137   < > 139   < > 139   < > 138 136 135  K 4.0   < > 4.4   < > 3.8   < > 3.5 3.2* 4.3  CL 104   < > 103   < > 107   < > 104 103 100  CO2 22   < > 25   < > 23   < > $R'22 22 23  'xi$ GLUCOSE 134*   < > 149*   < > 161*   < > 127* 118* 97  BUN 16   < > 21   < > 17   < > $R'14 10 21  'Ee$ CREATININE 1.06   < > 0.98   < > 1.05   < > 0.91 0.69 0.88  CALCIUM 8.8*   < > 9.2   < > 8.8*   < > 9.1 8.6* 9.1  GFRNONAA >60   < > >60   < > >60   < > >60 >60 >60  GFRAA >60  --  >60  --  >60  --   --   --   --   PROT 6.6   < > 6.9   < > 6.5   < > 6.6 6.0* 7.0  ALBUMIN 3.5   < > 3.6   < > 3.4*   < > 3.0* 2.7* 3.0*  AST 36   < > 48*   < > 54*   < > 39 38 57*  ALT 76*   < > 90*   < > 97*   < > $R'22 19 21  'GR$ ALKPHOS 84   < > 83   < > 89   < > 174* 160* 219*  BILITOT 0.5   < > 0.6   < > 0.6   < > 1.0 0.9 1.5*   < > = values in this interval not displayed.   Iron/TIBC/Ferritin/ %Sat    Component Value Date/Time   IRON 119 04/27/2019 0940   TIBC 363 04/27/2019 0940   FERRITIN 58 04/27/2019 0940   IRONPCTSAT 33 04/27/2019 0940      RADIOGRAPHIC STUDIES: I have personally reviewed the radiological images as listed and agreed with the findings in the report. CT Angio Head W or Wo Contrast  Result Date: 09/07/2020 CLINICAL DATA:  Headache with some numbness on the right side. EXAM: CT ANGIOGRAPHY HEAD AND NECK TECHNIQUE: Multidetector CT imaging of the head and neck was performed using the standard protocol during bolus administration of intravenous contrast. Multiplanar CT image reconstructions and MIPs were obtained to evaluate the vascular  anatomy. Carotid stenosis measurements (when applicable) are obtained utilizing NASCET criteria, using the distal internal carotid diameter as the denominator. CONTRAST:  60mL OMNIPAQUE IOHEXOL 350 MG/ML SOLN COMPARISON:  Same day head CT. FINDINGS: CTA NECK FINDINGS Aortic arch: Extensive calcific and noncalcific atherosclerosis of the partially imaged aorta. Great vessel origins are patent. Right carotid system: Approximately 50-60% stenosis of the right common carotid artery origin.  Predominantly calcific atherosclerosis at the carotid bifurcation without greater than 50% narrowing. Left carotid system: The left common carotid artery arises directly from the aorta with severe stenosis secondary atherosclerosis. Mixed calcific and noncalcific atherosclerosis at the carotid bifurcation with approximately 50% stenosis Vertebral arteries: Left dominant. Severe stenosis of the right vertebral artery origin. The right vertebral artery is diminutive and poorly opacified proximally and becomes non-opacified at approximately C2-C3. Moderate atherosclerotic narrowing of the proximal left vertebral artery. Skeleton: Severe degenerative disc disease at C4-C5 C5-C6. No evidence of acute fracture. Other neck: No mass or suspicious adenopathy. Upper chest: Partially imaged opacities in the right upper lobe, similar to prior CT chest. Review of the MIP images confirms the above findings CTA HEAD FINDINGS Anterior circulation: Bilateral petrous and cavernous internal carotid arteries are patent. Bilateral cavernous carotid calcific atherosclerosis without evidence of hemodynamically significant stenosis. Bilateral MCAs are patent without evidence hemodynamically significant proximal stenosis or large vessel occlusion. Right dominant A1 ACA with hypoplastic or absent left A1 ACA. No evidence of hemodynamically significant stenosis or large vessel occlusion involving the ACAs. No aneurysm. Posterior circulation: Non-opacified right  intradural vertebral artery. The left intradural vertebral artery and basilar artery are patent. Bilateral PCAs are opacified proximally. Suspected severe stenosis of the left PCA at the P2/P3 junction with poor opacification of more distal PCA branches. Venous sinuses: As permitted by contrast timing, patent. Small left transverse and sigmoid sinus. Review of the MIP images confirms the above findings IMPRESSION: 1. Intracranially, there is suspected severe stenosis of the left PCA at the P2/P3 junction with poor opacification of more distal PCA branches. Otherwise, no evidence of large vessel occlusion or hemodynamically significant stenosis intracranially. 2.  Extensive atherosclerosis in the neck, including: - Severe stenosis of the left common carotid origin and approximately 50-60% stenosis of the right common carotid artery origin. - Approximately 50% stenosis of the proximal left cervical ICA. - Severe stenosis of the non dominant right vertebral artery origin with poor opacification of the proximal right vertebral artery and non opacification distally. - Moderate narrowing of the dominant left vertebral artery proximally. 3. Partially imaged bilateral pulmonary emboli and right upper lobe opacities, which were better characterized on recent CTA of the chest from August 29, 2020. Findings were communicated on 09/07/2020 at 10:33 am to provider Dr. Charna Archer Via telephone, who verbally acknowledged these results. Electronically Signed   By: Margaretha Sheffield MD   On: 09/07/2020 10:36   DG Chest 2 View  Result Date: 09/02/2020 CLINICAL DATA:  Tachycardia EXAM: CHEST - 2 VIEW COMPARISON:  None. FINDINGS: The heart size and mediastinal contours are within normal limits. Both lungs are clear. The visualized skeletal structures are unremarkable. Right chest wall Port-A-Cath with tip at the cavoatrial junction. IMPRESSION: No active cardiopulmonary disease. Electronically Signed   By: Ulyses Jarred M.D.   On:  09/02/2020 03:28   DG Chest 2 View  Result Date: 08/25/2020 CLINICAL DATA:  Shortness of breath. EXAM: CHEST - 2 VIEW COMPARISON:  01/23/2018 FINDINGS: The right IJ Port-A-Cath is in good position. Stable tortuosity, ectasia and calcification of the thoracic aorta. Stable surgical changes from triple bypass surgery. The lungs are clear of an acute process. No pleural effusions or pulmonary edema. The bony thorax is intact. IMPRESSION: No acute cardiopulmonary findings. Electronically Signed   By: Marijo Sanes M.D.   On: 08/25/2020 16:19   DG Abd 1 View  Result Date: 08/25/2020 CLINICAL DATA:  Abdominal pain. EXAM: ABDOMEN - 1 VIEW  COMPARISON:  01/03/2014 FINDINGS: There is moderate stool throughout the colon and down into the rectum suggesting constipation. No distended small bowel loops are identified. No free air. The bony structures are intact. Lumbar fusion hardware noted. Vascular calcifications are noted. IMPRESSION: Moderate stool throughout the colon suggesting constipation. Electronically Signed   By: Marijo Sanes M.D.   On: 08/25/2020 16:21   CT Angio Neck W and/or Wo Contrast  Result Date: 09/07/2020 CLINICAL DATA:  Headache with some numbness on the right side. EXAM: CT ANGIOGRAPHY HEAD AND NECK TECHNIQUE: Multidetector CT imaging of the head and neck was performed using the standard protocol during bolus administration of intravenous contrast. Multiplanar CT image reconstructions and MIPs were obtained to evaluate the vascular anatomy. Carotid stenosis measurements (when applicable) are obtained utilizing NASCET criteria, using the distal internal carotid diameter as the denominator. CONTRAST:  49mL OMNIPAQUE IOHEXOL 350 MG/ML SOLN COMPARISON:  Same day head CT. FINDINGS: CTA NECK FINDINGS Aortic arch: Extensive calcific and noncalcific atherosclerosis of the partially imaged aorta. Great vessel origins are patent. Right carotid system: Approximately 50-60% stenosis of the right common  carotid artery origin. Predominantly calcific atherosclerosis at the carotid bifurcation without greater than 50% narrowing. Left carotid system: The left common carotid artery arises directly from the aorta with severe stenosis secondary atherosclerosis. Mixed calcific and noncalcific atherosclerosis at the carotid bifurcation with approximately 50% stenosis Vertebral arteries: Left dominant. Severe stenosis of the right vertebral artery origin. The right vertebral artery is diminutive and poorly opacified proximally and becomes non-opacified at approximately C2-C3. Moderate atherosclerotic narrowing of the proximal left vertebral artery. Skeleton: Severe degenerative disc disease at C4-C5 C5-C6. No evidence of acute fracture. Other neck: No mass or suspicious adenopathy. Upper chest: Partially imaged opacities in the right upper lobe, similar to prior CT chest. Review of the MIP images confirms the above findings CTA HEAD FINDINGS Anterior circulation: Bilateral petrous and cavernous internal carotid arteries are patent. Bilateral cavernous carotid calcific atherosclerosis without evidence of hemodynamically significant stenosis. Bilateral MCAs are patent without evidence hemodynamically significant proximal stenosis or large vessel occlusion. Right dominant A1 ACA with hypoplastic or absent left A1 ACA. No evidence of hemodynamically significant stenosis or large vessel occlusion involving the ACAs. No aneurysm. Posterior circulation: Non-opacified right intradural vertebral artery. The left intradural vertebral artery and basilar artery are patent. Bilateral PCAs are opacified proximally. Suspected severe stenosis of the left PCA at the P2/P3 junction with poor opacification of more distal PCA branches. Venous sinuses: As permitted by contrast timing, patent. Small left transverse and sigmoid sinus. Review of the MIP images confirms the above findings IMPRESSION: 1. Intracranially, there is suspected severe  stenosis of the left PCA at the P2/P3 junction with poor opacification of more distal PCA branches. Otherwise, no evidence of large vessel occlusion or hemodynamically significant stenosis intracranially. 2.  Extensive atherosclerosis in the neck, including: - Severe stenosis of the left common carotid origin and approximately 50-60% stenosis of the right common carotid artery origin. - Approximately 50% stenosis of the proximal left cervical ICA. - Severe stenosis of the non dominant right vertebral artery origin with poor opacification of the proximal right vertebral artery and non opacification distally. - Moderate narrowing of the dominant left vertebral artery proximally. 3. Partially imaged bilateral pulmonary emboli and right upper lobe opacities, which were better characterized on recent CTA of the chest from August 29, 2020. Findings were communicated on 09/07/2020 at 10:33 am to provider Dr. Charna Archer Via telephone, who verbally acknowledged these  results. Electronically Signed   By: Feliberto Harts MD   On: 09/07/2020 10:36   MR BRAIN WO CONTRAST  Addendum Date: 09/07/2020   ADDENDUM REPORT: 09/07/2020 14:42 ADDENDUM: Findings discussed with Dr. Thana Farr via telephone at 2:40 PM. Electronically Signed   By: Feliberto Harts MD   On: 09/07/2020 14:42   Result Date: 09/07/2020 EXAM: MRI HEAD WITHOUT CONTRAST TECHNIQUE: Multiplanar, multiecho pulse sequences of the brain and surrounding structures were obtained without intravenous contrast. COMPARISON:  Same day CT exams. FINDINGS: Brain: Acute left PCA territory infarct which involves the left occipital lobe, left hippocampus, and left thalamus. Mild associated edema without substantial mass effect. Additionally, there small infarcts in the left frontal lobe, left caudate, and right cerebellar hemisphere. No hydrocephalus. No mass lesion or suspect antral mass effect. No extra-axial fluid collection. Vascular: Further evaluated on same day  CTA Skull and upper cervical spine: Normal marrow signal. Sinuses/Orbits: No acute finding. Other: No mastoid effusions. IMPRESSION: 1. Acute left PCA territory infarct which involves the left occipital lobe, left hippocampus, and left thalamus. Mild associated edema without substantial mass effect. 2. Additionally, there small infarcts in the left frontal lobe, left caudate, and right cerebellar hemisphere. Given involvement of multiple vascular territories, consider centrum embolic source. Electronically Signed: By: Feliberto Harts MD On: 09/07/2020 14:13   CT CHEST ABDOMEN PELVIS W CONTRAST  Result Date: 08/31/2020 CLINICAL DATA:  Metastatic pancreatic cancer. Shortness of breath. EXAM: CT CHEST, ABDOMEN, AND PELVIS WITH CONTRAST TECHNIQUE: Multidetector CT imaging of the chest, abdomen and pelvis was performed following the standard protocol during bolus administration of intravenous contrast. CONTRAST:  OMNIPAQUE IOHEXOL 300 MG/ML  SOLN COMPARISON:  CT scan 06/04/2020 FINDINGS: CT CHEST FINDINGS Cardiovascular: The heart is normal in size. No pericardial effusion. Stable tortuosity, mild ectasia and calcification of the thoracic aorta. Stable branch vessel calcifications including three-vessel coronary artery calcifications. Stable surgical changes from bypass surgery. Mediastinum/Nodes: There are bilateral pulmonary emboli. No evidence of right heart strain. The RV LV ratio is less than 1. Small scattered mediastinal and hilar lymph nodes are stable. None of these measures more than 8 mm. Lungs/Pleura: Stable 7 mm irregular nodule at the left lung base. 3 mm subpleural pulmonary nodule along the major fissure on image 85/3 is slightly more pronounced but I think this is due to breathing motion artifact. Similar finding involving the 3 mm nodule along the right minor fissure. 5 mm subpleural nodule in the right upper lobe on image number 72/3 previously measured 3 mm. Subpleural atelectasis noted  in the right upper lobe posteriorly which may be related to the patient's pulmonary embolism. No pleural effusions. Musculoskeletal: No significant bony findings. No evidence of osseous metastatic disease. CT ABDOMEN PELVIS FINDINGS Hepatobiliary: Progressive hepatic metastatic disease. Enlarging/coalescing mass in segment 8 on image 53/2 measures 5.9 x 5.5 cm and previously measured approximately 4.4 x 4.2 cm. Segment 7 lesion on image 51/2 measures 6.5 x 4.7 cm and previously measured 4.6 x 3.6 cm. Segment 6 lesion on image 63/2 measures 5.5 x 4.5 cm and previously measured 4.4 x 3.4 cm. Pancreas: Pancreatic head mass measures 2.8 x 2.7 cm and previously measured 3.1 x 2.9 cm. Marked atrophy of the remainder of the gland is again noted. Spleen: Normal size. No focal lesions. Adrenals/Urinary Tract: The adrenal glands and kidneys are stable. Bilateral renal cysts are noted. The midpole left renal cyst posteriorly is partially hemorrhagic. Stomach/Bowel: The stomach, duodenum, small bowel and colon are grossly  normal without oral contrast. No inflammatory changes, mass lesions or obstructive findings. The appendix is normal. Vascular/Lymphatic: Severe atherosclerotic calcifications involving the aorta, iliac arteries and branch vessels. The right internal iliac artery is occluded proximally. No mesenteric or retroperitoneal mass or adenopathy. Reproductive: The prostate gland and seminal vesicles are unremarkable. Other: No pelvic mass or adenopathy. No free pelvic fluid collections. No inguinal mass or adenopathy. No abdominal wall hernia or subcutaneous lesions. Musculoskeletal: No significant bony findings. Remote lumbar fusion changes. IMPRESSION: 1. Bilateral pulmonary emboli. No evidence of right heart strain. 2. Progressive hepatic metastatic disease. 3. Slight interval decrease in size of the pancreatic head mass. 4. Stable advanced atherosclerotic calcifications involving the thoracic and abdominal aorta  and branch vessels including the coronary arteries. 5. Stable pulmonary nodules. 6. Aortic atherosclerosis. These results will be called to the ordering clinician or representative by the Radiologist Assistant, and communication documented in the PACS or [employer]. Aortic Atherosclerosis (ICD10-I70.0). Electronically Signed   By: Marijo Sanes M.D.   On: 08/31/2020 15:05   US Venous Img Lower Bilateral (DVT)  Result Date: 09/02/2020 CLINICAL DATA:  74 year old male with a history prior pulmonary embolism EXAM: BILATERAL LOWER EXTREMITY VENOUS DOPPLER ULTRASOUND TECHNIQUE: Gray-scale sonography with graded compression, as well as color Doppler and duplex ultrasound were performed to evaluate the lower extremity deep venous systems from the level of the common femoral vein and including the common femoral, femoral, profunda femoral, popliteal and calf veins including the posterior tibial, peroneal and gastrocnemius veins when visible. The superficial great saphenous vein was also interrogated. Spectral Doppler was utilized to evaluate flow at rest and with distal augmentation maneuvers in the common femoral, femoral and popliteal veins. COMPARISON:  None. FINDINGS: RIGHT LOWER EXTREMITY Common Femoral Vein: No evidence of thrombus. Normal compressibility, respiratory phasicity and response to augmentation. Saphenofemoral Junction: No evidence of thrombus. Normal compressibility and flow on color Doppler imaging. Profunda Femoral Vein: No evidence of thrombus. Normal compressibility and flow on color Doppler imaging. Femoral Vein: No evidence of thrombus. Normal compressibility, respiratory phasicity and response to augmentation. Popliteal Vein: Partially compressible popliteal vein without occlusion. Calf Veins: No evidence of thrombus. Normal compressibility and flow on color Doppler imaging. Superficial Great Saphenous Vein: No evidence of thrombus. Normal compressibility and flow on color Doppler  imaging. Other Findings:  None. LEFT LOWER EXTREMITY Common Femoral Vein: No evidence of thrombus. Normal compressibility, respiratory phasicity and response to augmentation. Saphenofemoral Junction: No evidence of thrombus. Normal compressibility and flow on color Doppler imaging. Profunda Femoral Vein: No evidence of thrombus. Normal compressibility and flow on color Doppler imaging. Femoral Vein: No evidence of thrombus. Normal compressibility, respiratory phasicity and response to augmentation. Popliteal Vein: No evidence of thrombus. Normal compressibility, respiratory phasicity and response to augmentation. Calf Veins: No evidence of thrombus. Normal compressibility and flow on color Doppler imaging. Superficial Great Saphenous Vein: No evidence of thrombus. Normal compressibility and flow on color Doppler imaging. Other Findings: Lentiform heterogeneously echoic soft tissue within the thigh measures 4.1 cm x 1.6 cm x 5.1 cm. No internal color flow. IMPRESSION: Sonographic survey of the right lower extremity positive for nonocclusive DVT of the popliteal vein. Sonographic survey of the left lower extremity negative for DVT. Lentiform soft tissue lesion in the left thigh measures 5.1 cm. Ultrasound characteristics are nonspecific, and this may represent a lipoma. Note that low-grade liposarcoma cannot be excluded on ultrasound. Correlation with patient presentation may be useful, as well as consideration of MRI if indicated. Electronically  Signed   By: Gilmer Mor D.O.   On: 09/02/2020 11:51   ECHOCARDIOGRAM COMPLETE  Result Date: 09/03/2020    ECHOCARDIOGRAM REPORT   Patient Name:   Taurus JANOSKI Date of Exam: 09/02/2020 Medical Rec #:  [id number]       Height:       69.0 in Accession #:    [id number]      Weight:       160.0 lb Date of Birth:  Aug 06, 1946       BSA:          1.879 m Patient Age:    74 years        BP:           165/84 mmHg Patient Gender: M               HR:           85 bpm. Exam  Location:  [facility] Procedure: 2D Echo, Color Doppler and Cardiac Doppler Indications:     I26.99 Pulmonary Embolus  History:         Patient has prior history of Echocardiogram examinations. CHF,                  CAD, Prior CABG; Risk Factors:Hypertension and Dyslipidemia.  Sonographer:     Humphrey Rolls RDCS (AE) Referring Phys:  [id number] Lorretta Harp Diagnosing Phys: Alwyn Pea MD  Sonographer Comments: Suboptimal subcostal window. IMPRESSIONS  1. Left ventricular ejection fraction, by estimation, is 55 to 60%. The left ventricle has normal function. The left ventricle has no regional wall motion abnormalities. Left ventricular diastolic parameters are consistent with Grade I diastolic dysfunction (impaired relaxation).  2. Right ventricular systolic function is normal. The right ventricular size is normal.  3. The mitral valve is normal in structure. No evidence of mitral valve regurgitation.  4. The aortic valve is grossly normal. There is mild calcification of the aortic valve. There is mild thickening of the aortic valve. Aortic valve regurgitation is not visualized. Mild to moderate aortic valve sclerosis/calcification is present, without  any evidence of aortic stenosis. FINDINGS  Left Ventricle: Left ventricular ejection fraction, by estimation, is 55 to 60%. The left ventricle has normal function. The left ventricle has no regional wall motion abnormalities. The left ventricular internal cavity size was normal in size. There is  no left ventricular hypertrophy. Left ventricular diastolic parameters are consistent with Grade I diastolic dysfunction (impaired relaxation). Right Ventricle: The right ventricular size is normal. No increase in right ventricular wall thickness. Right ventricular systolic function is normal. Left Atrium: Left atrial size was normal in size. Right Atrium: Right atrial size was normal in size. Pericardium: There is no evidence of pericardial effusion. Mitral Valve: The mitral valve is  normal in structure. No evidence of mitral valve regurgitation. MV peak gradient, 5.0 mmHg. The mean mitral valve gradient is 2.0 mmHg. Tricuspid Valve: The tricuspid valve is normal in structure. Tricuspid valve regurgitation is not demonstrated. Aortic Valve: The aortic valve is grossly normal. There is mild calcification of the aortic valve. There is mild thickening of the aortic valve. There is mild aortic valve annular calcification. Aortic valve regurgitation is not visualized. Mild to moderate aortic valve sclerosis/calcification is present, without any evidence of aortic stenosis. Aortic valve mean gradient measures 3.0 mmHg. Aortic valve peak gradient measures 5.3 mmHg. Aortic valve area, by VTI measures 1.71 cm. Pulmonic Valve: The pulmonic valve was grossly normal. Pulmonic valve regurgitation is not  visualized. Aorta: The ascending aorta was not well visualized. IAS/Shunts: No atrial level shunt detected by color flow Doppler.  LEFT VENTRICLE PLAX 2D LVIDd:         4.06 cm  Diastology LVIDs:         2.85 cm  LV e' medial:    5.33 cm/s LV PW:         0.97 cm  LV E/e' medial:  14.0 LV IVS:        0.78 cm  LV e' lateral:   8.05 cm/s LVOT diam:     2.00 cm  LV E/e' lateral: 9.3 LV SV:         33 LV SV Index:   17 LVOT Area:     3.14 cm  RIGHT VENTRICLE RV Basal diam:  2.50 cm RV Mid diam:    3.64 cm LEFT ATRIUM           Index       RIGHT ATRIUM          Index LA diam:      2.90 cm 1.54 cm/m  RA Area:     8.63 cm LA Vol (A2C): 26.3 ml 14.00 ml/m RA Volume:   13.40 ml 7.13 ml/m LA Vol (A4C): 24.5 ml 13.04 ml/m  AORTIC VALVE                   PULMONIC VALVE AV Area (Vmax):    1.87 cm    PV Vmax:       0.67 m/s AV Area (Vmean):   1.89 cm    PV Vmean:      50.000 cm/s AV Area (VTI):     1.71 cm    PV VTI:        0.118 m AV Vmax:           115.00 cm/s PV Peak grad:  1.8 mmHg AV Vmean:          76.500 cm/s PV Mean grad:  1.0 mmHg AV VTI:            0.191 m AV Peak Grad:      5.3 mmHg AV Mean Grad:       3.0 mmHg LVOT Vmax:         68.50 cm/s LVOT Vmean:        46.100 cm/s LVOT VTI:          0.104 m LVOT/AV VTI ratio: 0.54  AORTA Ao Root diam: 3.10 cm MITRAL VALVE MV Area (PHT): 5.20 cm     SHUNTS MV Peak grad:  5.0 mmHg     Systemic VTI:  0.10 m MV Mean grad:  2.0 mmHg     Systemic Diam: 2.00 cm MV Vmax:       1.12 m/s MV Vmean:      59.6 cm/s MV Decel Time: 146 msec MV E velocity: 74.60 cm/s MV A velocity: 110.00 cm/s MV E/A ratio:  0.68 Dwayne D Callwood MD Electronically signed by Yolonda Kida MD Signature Date/Time: 09/03/2020/4:31:23 PM    Final    CT HEAD CODE STROKE WO CONTRAST  Result Date: 09/07/2020 CLINICAL DATA:  Code stroke. Neuro deficit, acute stroke suspected. Right-sided weakness and headache. EXAM: CT HEAD WITHOUT CONTRAST TECHNIQUE: Contiguous axial images were obtained from the base of the skull through the vertex without intravenous contrast. COMPARISON:  None. FINDINGS: Brain: No evidence of acute large vascular territory infarction, hemorrhage, hydrocephalus, extra-axial collection or mass lesion/mass effect.  Patchy white matter hypoattenuation, nonspecific but most likely secondary to chronic microvascular ischemic disease. Vascular: Intracranial calcific atherosclerosis. No hyperdense vessel identified. Skull: No acute fracture. Sinuses/Orbits: No acute findings. Other: No mastoid effusions. ASPECTS [facility] Stroke Program Early CT Score) total score (0-10 with 10 being normal): 10. IMPRESSION: No evidence of acute intracranial abnormality.  ASPECTS is 10. Code stroke imaging results were communicated on 09/07/2020 at 9:54 am to provider Dr. Charna Archer via telephone, who verbally acknowledged these results. Electronically Signed   By: Margaretha Sheffield MD   On: 09/07/2020 09:57      ASSESSMENT & PLAN:  1. Pancreatic cancer metastasized to liver ([place])   2. Goals of care, counseling/discussion   3. Cerebrovascular accident (CVA), unspecified mechanism ([place])   4. Acute  pulmonary embolism without acute cor pulmonale, unspecified pulmonary embolism type [facility])    #Metastatic pancreatic cancer- liver mets, hilar nodal metastasis, ?bone mets, Palliative service Vonna Kotyk and I met with patient, wife and sister Restaging CT images/MRI brain images were independently reviewed and discussed with patient, wife and sister. Unfortunately patient has progressive disease in liver.  Recent labs were reviewed as well. He starts to have slight elevation of AST and total bilirubin. I had a lengthy discussion with patient, wife and sister.  Overall his prognosis is very poor. Patient has progressed through multiple lines of chemotherapy treatments, in addition, he has had decreased performance status due to recent incidence of acute pulmonary embolism and acute stroke, both of events are most likely consequence of cancer progression. There are limited chemotherapy options, however response rate is low.  Alternatively, comfort care and hospice were discussed as an option. Patient has very poor insight of his cancer progression, prognosis.  He was unsure why was he here today in the clinic.  He clearly has suffered cognitive impairment from recent stroke events.  Is not able to make complex medical decision at this point.  Ultimately, both wife and his sister felt that additional cancer treatment would not be in patient's best interest.  They both verbalize a desire to proceed with comfort care/hospice at home.  CODE STATUS is DNR/DNI. Continue best supportive care.  Refer to hospice.     #Acute pulmonary embolism, continue Eliquis 5 mg twice daily. #Acute embolic stroke, continue aspirin $RemoveBeforeDE'81mg'[id number]$  + Eliquis #All questions were answered. The patient knows to call the clinic with any problems questions or concerns.  Craig Server, MD, PhD Hematology Oncology [place] at [facility] 09/12/2020

## 2020-09-12 NOTE — Progress Notes (Signed)
[facility]  [phone] [phone]   Name: Craig [name] Date: 09/12/2020 MRN: [id number]  DOB: 19-Jan-1946  Patient Care Team: Paulene Floor as PCP - General (Physician Assistant) Clent Jacks, RN as Oncology Nurse Navigator Earlie Server, MD as Consulting Physician (Hematology and Oncology) Neldon Labella, RN as Case Manager Lonia Farber, MD as Consulting Physician (Internal Medicine)    REASON FOR CONSULTATION: Mr. 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   Patient was hospitalized 09/02/2020-09/04/2020 with bilateral pulmonary emboli.  Patient was started on Eliquis.  Patient was again hospitalized 09/07/2020-09/08/2020 with right-sided weakness and was found to have an acute CVA.  CTA of the head and neck showed severe stenosis of the left PCA.  MRI of the brain revealed acute left PCA territory infarct with additional small infarcts in the left frontal lobe, left caudate, and right cerebellar hemisphere.  Patient was treated conservatively as he was already anticoagulated on p.o. Eliquis and has a limited life expectancy.  Rehab was recommended but patient ultimately declined and was discharged home.  SOCIAL HISTORY:     reports that he quit smoking about 2 years ago. His smoking use included cigarettes. He has a 37.50 pack-year smoking history. He has quit using smokeless tobacco.  His smokeless tobacco use included chew. He reports previous alcohol use. He reports previous drug use. Drug: Marijuana.   Patient is married and lives at  home with his wife.  He has a son and a daughter but reports that he is estranged from both.  He had another daughter who is now deceased.  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  Note that patient is illiterate.  He also says that his wife often will not answer the phone and recommends that we call and leave a message on the answering machine and then call her right back.  ADVANCE DIRECTIVES:  Does not have  CODE STATUS: DNR/DNI (MOST form completed on 08/31/2019)  PAST MEDICAL HISTORY: Past Medical History:  Diagnosis Date  . Allergic rhinitis   . CHF (congestive heart failure) ([place])   . Chronic cholecystitis   . Coronary artery disease   . DDD (degenerative disc disease), cervical   . Dehydration 06/21/2019  . Diabetes mellitus without complication ([place])   . Dyspnea   . Early cataract   . Erectile dysfunction   . Family history of brain cancer   . Family history of cancer   . GERD (gastroesophageal reflux disease)   . Gouty arthritis   . Headache    occasional migraines  . Hypercalcemia   . Hyperlipemia   . Hypertension   . Palpitations   . Pancreatic cancer metastasized to liver ([place]) 05/2019   Chemo tx's  . Peripheral vascular disease ([place])   . 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   LIMA to LAD SVG to [place] SVG SEQUENTIALLY to OM1 and OM2 SVG to ACUTE MARGINAL  . Spinal stenosis of cervical region   . Vitamin D deficiency     PAST SURGICAL HISTORY:  Past Surgical History:  Procedure Laterality Date  . BACK SURGERY  2018  fusion with screws  . CHOLECYSTECTOMY N/A 11/13/2018   Procedure: LAPAROSCOPIC CHOLECYSTECTOMY;  Surgeon: Vickie Epley, MD;  Location: [facility];  Service: General;  Laterality: N/A;  . COLONOSCOPY    . CORONARY ARTERY BYPASS GRAFT N/A 09/30/2017   Procedure: CORONARY ARTERY BYPASS GRAFTING times five using right and left Saphaneous vein harvested endoscopicly  and left internal mammary artery.  (CABG),TEE;  Surgeon: Rexene Alberts, MD;  Location: [facility];  Service: Open Heart Surgery;  Laterality: N/A;  . LEFT HEART CATH AND CORONARY ANGIOGRAPHY Left 09/06/2017   Procedure: LEFT HEART CATH AND CORONARY ANGIOGRAPHY;  Surgeon: Corey Skains, MD;  Location: [facility];  Service: Cardiovascular;  Laterality: Left;  . percutaneous transluminal balloon angioplasty  01/2010   of left lower extremity  . PORTA CATH INSERTION N/A 06/06/2019   Procedure: PORTA CATH INSERTION;  Surgeon: Katha Cabal, MD;  Location: [facility];  Service: Cardiovascular;  Laterality: N/A;  . TEE WITHOUT CARDIOVERSION N/A 09/30/2017   Procedure: TRANSESOPHAGEAL ECHOCARDIOGRAM (TEE);  Surgeon: Rexene Alberts, MD;  Location: [facility];  Service: Open Heart Surgery;  Laterality: N/A;  . VASCULAR SURGERY Left 2010   left exernal iliac and superficial femoral artery PTCA and stenting    HEMATOLOGY/ONCOLOGY HISTORY:  Oncology History  Pancreatic cancer metastasized to liver ([place])  06/01/2019 Initial Diagnosis   Pancreatic cancer metastasized to liver ([place])   06/07/2019 - 10/31/2019 Chemotherapy   The patient had PACLitaxel-protein bound (ABRAXANE) chemo infusion 250 mg, 125 mg/m2 = 250 mg (100 % of original dose 125 mg/m2), Intravenous,  Once, 4 of 4 cycles Dose modification: 100 mg/m2 (original dose 125 mg/m2, Cycle 1, Reason: Provider Judgment), 125 mg/m2 (original dose 125 mg/m2, Cycle 1, Reason: Provider Judgment), 100 mg/m2 (original dose 125 mg/m2, Cycle 2, Reason: Dose not tolerated, Comment: neuropathy), 90 mg/m2 (original dose 125 mg/m2, Cycle 3, Reason: Dose not tolerated, Comment: neuropathy), 100 mg/m2 (original dose 125 mg/m2, Cycle 3, Reason: Dose not tolerated) Administration: 250 mg (06/07/2019), 250 mg (06/14/2019), 250 mg (06/21/2019), 200 mg (07/05/2019), 200 mg (07/12/2019), 200 mg (07/26/2019), 200 mg (08/02/2019), 200 mg (08/16/2019), 200 mg (08/31/2019) gemcitabine (GEMZAR) 2,000  mg in sodium chloride 0.9 % 250 mL chemo infusion, 2,090 mg, Intravenous,  Once, 7 of 8 cycles Dose modification: 800 mg/m2 (original dose 1,000 mg/m2, Cycle 5, Reason: Dose not tolerated), 1,000 mg/m2 (original dose 1,000 mg/m2, Cycle 5, Reason: Provider Judgment) Administration: 2,000 mg (06/07/2019), 2,000 mg (09/14/2019), 2,000 mg (09/21/2019), 2,000 mg (10/03/2019), 2,000 mg (10/10/2019), 2,000 mg (06/14/2019), 2,000 mg (07/05/2019), 1,600 mg (07/12/2019), 1,600 mg (07/26/2019), 1,600 mg (08/02/2019), 1,600 mg (08/16/2019), 1,600 mg (08/31/2019), 2,000 mg (10/24/2019), 2,000 mg (10/31/2019)  for chemotherapy treatment.    11/19/2019 - 12/31/2019 Chemotherapy   The patient had pembrolizumab (KEYTRUDA) 200 mg in sodium chloride 0.9 % 50 mL chemo infusion, 200 mg, Intravenous, Once, 3 of 6 cycles Administration: 200 mg (11/19/2019), 200 mg (12/31/2019), 200 mg (12/10/2019)  for chemotherapy treatment.    01/07/2020 - 05/29/2020 Chemotherapy   The patient had dexamethasone (DECADRON) 4 MG tablet, 8 mg, Oral, Daily, 1 of 1 cycle, Start date: 01/02/2020, End date: 01/07/2020 palonosetron (ALOXI) injection 0.25 mg, 0.25 mg, Intravenous,  Once, 11 of 12 cycles Administration: 0.25 mg (01/07/2020), 0.25 mg (01/21/2020), 0.25 mg (02/04/2020), 0.25 mg (02/18/2020), 0.25 mg (03/03/2020), 0.25 mg (03/17/2020), 0.25 mg (03/31/2020), 0.25 mg (04/15/2020), 0.25 mg (04/29/2020), 0.25 mg (05/13/2020), 0.25 mg (05/27/2020) fluorouracil (ADRUCIL) 4,550 mg in sodium chloride  0.9 % 59 mL chemo infusion, 2,400 mg/m2 = 4,550 mg, Intravenous, 1 Day/Dose, 11 of 12 cycles Dose modification: 800 mg/m2/day (original dose 800 mg/m2/day, Cycle 6) Administration: 4,550 mg (01/07/2020), 4,550 mg (01/21/2020), 4,550 mg (02/04/2020), 4,550 mg (02/18/2020), 4,550 mg (03/03/2020), 4,550 mg (03/17/2020), 4,550 mg (03/31/2020), 4,550 mg (04/15/2020), 4,550 mg (04/29/2020), 1,500 mg (03/19/2020), 4,550 mg (05/13/2020), 4,550 mg (05/27/2020) irinotecan LIPOSOME (ONIVYDE) 96 mg in  sodium chloride 0.9 % 500 mL chemo infusion, 94.6 mg (100 % of original dose 50 mg/m2), Intravenous, Once, 11 of 12 cycles Dose modification: 50 mg/m2 (original dose 50 mg/m2, Cycle 1, Reason: Provider Judgment) Administration: 96 mg (01/07/2020), 129 mg (01/21/2020), 129 mg (02/04/2020), 129 mg (02/18/2020), 129 mg (03/03/2020), 129 mg (03/17/2020), 129 mg (03/31/2020), 129 mg (04/15/2020), 129 mg (04/29/2020), 129 mg (05/13/2020), 129 mg (05/27/2020) leucovorin 800 mg in sodium chloride 0.9 % 250 mL infusion, 760 mg, Intravenous,  Once, 11 of 12 cycles Administration: 800 mg (01/07/2020), 800 mg (01/21/2020), 800 mg (02/04/2020), 800 mg (02/18/2020), 800 mg (03/03/2020), 800 mg (03/17/2020), 800 mg (03/31/2020), 800 mg (04/15/2020), 800 mg (04/29/2020), 800 mg (05/13/2020), 800 mg (05/27/2020)  for chemotherapy treatment.    06/10/2020 - 08/21/2020 Chemotherapy   The patient had palonosetron (ALOXI) injection 0.25 mg, 0.25 mg, Intravenous,  Once, 6 of 7 cycles Administration: 0.25 mg (06/10/2020), 0.25 mg (06/24/2020), 0.25 mg (07/08/2020), 0.25 mg (07/22/2020), 0.25 mg (08/05/2020), 0.25 mg (08/19/2020) leucovorin 800 mg in dextrose 5 % 250 mL infusion, 410 mg/m2 = 780 mg, Intravenous,  Once, 6 of 7 cycles Administration: 800 mg (06/10/2020), 800 mg (06/24/2020), 800 mg (07/08/2020), 800 mg (07/22/2020), 800 mg (08/05/2020) oxaliplatin (ELOXATIN) 165 mg in dextrose 5 % 500 mL chemo infusion, 85 mg/m2 = 165 mg, Intravenous,  Once, 6 of 7 cycles Dose modification: 65 mg/m2 (original dose 85 mg/m2, Cycle 6, Reason: Other (see comments), Comment: neuropathy) Administration: 165 mg (06/10/2020), 165 mg (06/24/2020), 165 mg (07/08/2020), 165 mg (07/22/2020), 165 mg (08/05/2020), 125 mg (08/19/2020) fluorouracil (ADRUCIL) 4,700 mg in sodium chloride 0.9 % 56 mL chemo infusion, 2,400 mg/m2 = 4,700 mg, Intravenous, 1 Day/Dose, 6 of 7 cycles Administration: 4,700 mg (06/10/2020), 4,700 mg (06/24/2020), 4,700 mg (07/08/2020), 4,700 mg (07/22/2020),  4,700 mg (08/05/2020), 4,700 mg (08/19/2020)  for chemotherapy treatment.     Genetic Testing   Negative genetic testing. No pathogenic variants identified on the [facility] Common Hereditary Cancers Panel + RNA. The report date is 09/01/2020.  The Common Hereditary Cancers Panel offered by [facility] includes sequencing and/or deletion duplication testing of the following 47 genes: APC, ATM, AXIN2, BARD1, BMPR1A, BRCA1, BRCA2, BRIP1, CDH1, CDKN2A (p14ARF), CDKN2A (p16INK4a), CKD4, CHEK2, CTNNA1, DICER1, EPCAM (Deletion/duplication testing only), GREM1 (promoter region deletion/duplication testing only), KIT, MEN1, MLH1, MSH2, MSH3, MSH6, MUTYH, NBN, NF1, NHTL1, PALB2, PDGFRA, PMS2, POLD1, POLE, PTEN, RAD50, RAD51C, RAD51D, SDHB, SDHC, SDHD, SMAD4, SMARCA4. STK11, TP53, TSC1, TSC2, and VHL.  The following genes were evaluated for sequence changes only: SDHA and HOXB13 c.251G>A variant only.   09/08/2020 -  Chemotherapy   The patient had capecitabine (XELODA) 500 MG tablet, 1,500 mg, Oral, 2 times daily after meals, 1 of 1 cycle, Start date: 09/02/2020, End date: -- [id number] [facility]) injection 6 mg, 6 mg, Subcutaneous, Once, 0 of 6 cycles DOCEtaxel (TAXOTERE) 50 mg in sodium chloride 0.9 % 150 mL chemo infusion, 30 mg/m2, Intravenous,  Once, 0 of 6 cycles gemcitabine (GEMZAR) 1,368 mg in sodium chloride 0.9 % 250 mL chemo infusion, 750 mg/m2, Intravenous,  Once,  0 of 6 cycles  for chemotherapy treatment.      ALLERGIES:  is allergic to lipitor [atorvastatin], zetia [ezetimibe], lisinopril, protonix [pantoprazole], and spironolactone.  MEDICATIONS:  Current Outpatient Medications  Medication Sig Dispense Refill  . acetaminophen (TYLENOL) 325 MG tablet Take 2 tablets (650 mg total) by mouth every 6 (six) hours as needed for fever, headache or moderate pain.    [name] Kitchen apixaban (ELIQUIS) 5 MG TABS tablet Take 1 tablet (5 mg total) by mouth 2 (two) times daily. 60 tablet 2  . aspirin EC 81 MG tablet  Take 81 mg by mouth daily.    . capecitabine (XELODA) 500 MG tablet Take 3 tablets (1,500 mg total) by mouth 2 (two) times daily after a meal. Take for 14 days on, 7 days off. Repeat every 21 days. 84 tablet 0  . carvedilol (COREG) 6.25 MG tablet Take 6.25 mg by mouth 2 (two) times daily with a meal.  3  . docusate sodium (COLACE) 100 MG capsule Take 1 capsule (100 mg total) by mouth 2 (two) times daily. 60 capsule 0  . dorzolamide-timolol (COSOPT) 22.3-6.8 MG/ML ophthalmic solution INSTILL ONE DROP INTO BOTH EYES TWICE DAILY    . DULoxetine (CYMBALTA) 30 MG capsule TAKE 2 CAPSULES BY MOUTH EVERY DAY 180 capsule 1  . ferrous sulfate 325 (65 FE) MG tablet TAKE 1 TABLET BY MOUTH EVERY DAY WITH BREAKFAST 90 tablet 1  . gabapentin (NEURONTIN) 300 MG capsule Take 300 mg by mouth 3 (three) times daily.    . insulin glargine (LANTUS SOLOSTAR) 100 UNIT/ML Solostar Pen Inject 20 Units into the skin at bedtime. (Patient not taking: Reported on 09/09/2020) 15 mL 0  . insulin lispro (HUMALOG) 100 UNIT/ML KwikPen Inject 0.05 mLs (5 Units total) into the skin 3 (three) times daily with meals. (Patient not taking: Reported on 09/09/2020) 15 mL 11  . latanoprost (XALATAN) 0.005 % ophthalmic solution Place 1 drop into both eyes at bedtime.   3  . lidocaine-prilocaine (EMLA) cream APPLY TO AFFECTED AREA ONCE AS DIRECTED    . Multiple Vitamin (MULTIVITAMIN WITH MINERALS) TABS tablet Take 1 tablet by mouth daily.     . OMEGA-3 FATTY ACIDS PO Take 1,200 mg by mouth daily.  (Patient not taking: Reported on 09/12/2020)    . omeprazole (PRILOSEC) 40 MG capsule TAKE 1 CAPSULE BY MOUTH EVERY DAY 90 capsule 1  . ONETOUCH VERIO test strip TEST FASTING SUGAR EVERY MORNING 100 strip 3  . rosuvastatin (CRESTOR) 10 MG tablet Take 1 tablet by mouth daily. (Patient not taking: Reported on 09/12/2020)    . triamcinolone ointment (KENALOG) 0.5 % APPLY 1 APPLICATION TOPICALLY 3 (THREE) TIMES DAILY. 30 g 0   No current  facility-administered medications for this visit.   Facility-Administered Medications Ordered in Other Visits  Medication Dose Route Frequency Provider Last Rate Last Admin  . sodium chloride flush (NS) 0.9 % injection 10 mL  10 mL Intracatheter PRN Earlie Server, MD      . sodium chloride flush (NS) 0.9 % injection 10 mL  10 mL Intravenous Once Earlie Server, MD        VITAL SIGNS: There were no vitals taken for this visit. There were no vitals filed for this visit.  Estimated body mass index is 24.69 kg/m as calculated from the following:   Height as of 09/07/20: $RemoveBefo'5\' 6"'[id number]$  (1.676 m).   Weight as of 09/07/20: 153 lb (69.4 kg).  LABS: CBC:    Component Value Date/Time  WBC 8.4 09/09/2020 1241   HGB 10.1 (L) 09/09/2020 1241   HGB 11.9 (L) 01/29/2020 1429   HCT 33.0 (L) 09/09/2020 1241   HCT 35.8 (L) 01/29/2020 1429   PLT 285 09/09/2020 1241   PLT 187 01/29/2020 1429   MCV 93.8 09/09/2020 1241   MCV 86 01/29/2020 1429   NEUTROABS 5.0 09/09/2020 1241   NEUTROABS 5.0 01/29/2020 1429   LYMPHSABS 1.4 09/09/2020 1241   LYMPHSABS 1.3 01/29/2020 1429   MONOABS 1.6 (H) 09/09/2020 1241   EOSABS 0.3 09/09/2020 1241   EOSABS 0.2 01/29/2020 1429   BASOSABS 0.1 09/09/2020 1241   BASOSABS 0.0 01/29/2020 1429   Comprehensive Metabolic Panel:    Component Value Date/Time   NA 135 09/09/2020 1241   NA 137 01/29/2020 1429   K 4.3 09/09/2020 1241   CL 100 09/09/2020 1241   CO2 23 09/09/2020 1241   BUN 21 09/09/2020 1241   BUN 20 01/29/2020 1429   CREATININE 0.88 09/09/2020 1241   CREATININE 0.95 06/17/2017 1035   GLUCOSE 97 09/09/2020 1241   CALCIUM 9.1 09/09/2020 1241   AST 57 (H) 09/09/2020 1241   ALT 21 09/09/2020 1241   ALKPHOS 219 (H) 09/09/2020 1241   BILITOT 1.5 (H) 09/09/2020 1241   BILITOT 0.5 01/29/2020 1429   PROT 7.0 09/09/2020 1241   PROT 6.1 01/29/2020 1429   ALBUMIN 3.0 (L) 09/09/2020 1241   ALBUMIN 3.6 (L) 01/29/2020 1429    RADIOGRAPHIC STUDIES: CT Angio Head W or Wo  Contrast  Result Date: 09/07/2020 CLINICAL DATA:  Headache with some numbness on the right side. EXAM: CT ANGIOGRAPHY HEAD AND NECK TECHNIQUE: Multidetector CT imaging of the head and neck was performed using the standard protocol during bolus administration of intravenous contrast. Multiplanar CT image reconstructions and MIPs were obtained to evaluate the vascular anatomy. Carotid stenosis measurements (when applicable) are obtained utilizing NASCET criteria, using the distal internal carotid diameter as the denominator. CONTRAST:  4mL OMNIPAQUE IOHEXOL 350 MG/ML SOLN COMPARISON:  Same day head CT. FINDINGS: CTA NECK FINDINGS Aortic arch: Extensive calcific and noncalcific atherosclerosis of the partially imaged aorta. Great vessel origins are patent. Right carotid system: Approximately 50-60% stenosis of the right common carotid artery origin. Predominantly calcific atherosclerosis at the carotid bifurcation without greater than 50% narrowing. Left carotid system: The left common carotid artery arises directly from the aorta with severe stenosis secondary atherosclerosis. Mixed calcific and noncalcific atherosclerosis at the carotid bifurcation with approximately 50% stenosis Vertebral arteries: Left dominant. Severe stenosis of the right vertebral artery origin. The right vertebral artery is diminutive and poorly opacified proximally and becomes non-opacified at approximately C2-C3. Moderate atherosclerotic narrowing of the proximal left vertebral artery. Skeleton: Severe degenerative disc disease at C4-C5 C5-C6. No evidence of acute fracture. Other neck: No mass or suspicious adenopathy. Upper chest: Partially imaged opacities in the right upper lobe, similar to prior CT chest. Review of the MIP images confirms the above findings CTA HEAD FINDINGS Anterior circulation: Bilateral petrous and cavernous internal carotid arteries are patent. Bilateral cavernous carotid calcific atherosclerosis without evidence  of hemodynamically significant stenosis. Bilateral MCAs are patent without evidence hemodynamically significant proximal stenosis or large vessel occlusion. Right dominant A1 ACA with hypoplastic or absent left A1 ACA. No evidence of hemodynamically significant stenosis or large vessel occlusion involving the ACAs. No aneurysm. Posterior circulation: Non-opacified right intradural vertebral artery. The left intradural vertebral artery and basilar artery are patent. Bilateral PCAs are opacified proximally. Suspected severe stenosis of the left  PCA at the P2/P3 junction with poor opacification of more distal PCA branches. Venous sinuses: As permitted by contrast timing, patent. Small left transverse and sigmoid sinus. Review of the MIP images confirms the above findings IMPRESSION: 1. Intracranially, there is suspected severe stenosis of the left PCA at the P2/P3 junction with poor opacification of more distal PCA branches. Otherwise, no evidence of large vessel occlusion or hemodynamically significant stenosis intracranially. 2.  Extensive atherosclerosis in the neck, including: - Severe stenosis of the left common carotid origin and approximately 50-60% stenosis of the right common carotid artery origin. - Approximately 50% stenosis of the proximal left cervical ICA. - Severe stenosis of the non dominant right vertebral artery origin with poor opacification of the proximal right vertebral artery and non opacification distally. - Moderate narrowing of the dominant left vertebral artery proximally. 3. Partially imaged bilateral pulmonary emboli and right upper lobe opacities, which were better characterized on recent CTA of the chest from August 29, 2020. Findings were communicated on 09/07/2020 at 10:33 am to provider Dr. Larinda Buttery Via telephone, who verbally acknowledged these results. Electronically Signed   By: Feliberto Harts MD   On: 09/07/2020 10:36   DG Chest 2 View  Result Date: 09/02/2020 CLINICAL  DATA:  Tachycardia EXAM: CHEST - 2 VIEW COMPARISON:  None. FINDINGS: The heart size and mediastinal contours are within normal limits. Both lungs are clear. The visualized skeletal structures are unremarkable. Right chest wall Port-A-Cath with tip at the cavoatrial junction. IMPRESSION: No active cardiopulmonary disease. Electronically Signed   By: Deatra Robinson M.D.   On: 09/02/2020 03:28   DG Chest 2 View  Result Date: 08/25/2020 CLINICAL DATA:  Shortness of breath. EXAM: CHEST - 2 VIEW COMPARISON:  01/23/2018 FINDINGS: The right IJ Port-A-Cath is in good position. Stable tortuosity, ectasia and calcification of the thoracic aorta. Stable surgical changes from triple bypass surgery. The lungs are clear of an acute process. No pleural effusions or pulmonary edema. The bony thorax is intact. IMPRESSION: No acute cardiopulmonary findings. Electronically Signed   By: Rudie Meyer M.D.   On: 08/25/2020 16:19   DG Abd 1 View  Result Date: 08/25/2020 CLINICAL DATA:  Abdominal pain. EXAM: ABDOMEN - 1 VIEW COMPARISON:  01/03/2014 FINDINGS: There is moderate stool throughout the colon and down into the rectum suggesting constipation. No distended small bowel loops are identified. No free air. The bony structures are intact. Lumbar fusion hardware noted. Vascular calcifications are noted. IMPRESSION: Moderate stool throughout the colon suggesting constipation. Electronically Signed   By: Rudie Meyer M.D.   On: 08/25/2020 16:21   CT Angio Neck W and/or Wo Contrast  Result Date: 09/07/2020 CLINICAL DATA:  Headache with some numbness on the right side. EXAM: CT ANGIOGRAPHY HEAD AND NECK TECHNIQUE: Multidetector CT imaging of the head and neck was performed using the standard protocol during bolus administration of intravenous contrast. Multiplanar CT image reconstructions and MIPs were obtained to evaluate the vascular anatomy. Carotid stenosis measurements (when applicable) are obtained utilizing NASCET  criteria, using the distal internal carotid diameter as the denominator. CONTRAST:  63mL OMNIPAQUE IOHEXOL 350 MG/ML SOLN COMPARISON:  Same day head CT. FINDINGS: CTA NECK FINDINGS Aortic arch: Extensive calcific and noncalcific atherosclerosis of the partially imaged aorta. Great vessel origins are patent. Right carotid system: Approximately 50-60% stenosis of the right common carotid artery origin. Predominantly calcific atherosclerosis at the carotid bifurcation without greater than 50% narrowing. Left carotid system: The left common carotid artery arises directly from  the aorta with severe stenosis secondary atherosclerosis. Mixed calcific and noncalcific atherosclerosis at the carotid bifurcation with approximately 50% stenosis Vertebral arteries: Left dominant. Severe stenosis of the right vertebral artery origin. The right vertebral artery is diminutive and poorly opacified proximally and becomes non-opacified at approximately C2-C3. Moderate atherosclerotic narrowing of the proximal left vertebral artery. Skeleton: Severe degenerative disc disease at C4-C5 C5-C6. No evidence of acute fracture. Other neck: No mass or suspicious adenopathy. Upper chest: Partially imaged opacities in the right upper lobe, similar to prior CT chest. Review of the MIP images confirms the above findings CTA HEAD FINDINGS Anterior circulation: Bilateral petrous and cavernous internal carotid arteries are patent. Bilateral cavernous carotid calcific atherosclerosis without evidence of hemodynamically significant stenosis. Bilateral MCAs are patent without evidence hemodynamically significant proximal stenosis or large vessel occlusion. Right dominant A1 ACA with hypoplastic or absent left A1 ACA. No evidence of hemodynamically significant stenosis or large vessel occlusion involving the ACAs. No aneurysm. Posterior circulation: Non-opacified right intradural vertebral artery. The left intradural vertebral artery and basilar artery  are patent. Bilateral PCAs are opacified proximally. Suspected severe stenosis of the left PCA at the P2/P3 junction with poor opacification of more distal PCA branches. Venous sinuses: As permitted by contrast timing, patent. Small left transverse and sigmoid sinus. Review of the MIP images confirms the above findings IMPRESSION: 1. Intracranially, there is suspected severe stenosis of the left PCA at the P2/P3 junction with poor opacification of more distal PCA branches. Otherwise, no evidence of large vessel occlusion or hemodynamically significant stenosis intracranially. 2.  Extensive atherosclerosis in the neck, including: - Severe stenosis of the left common carotid origin and approximately 50-60% stenosis of the right common carotid artery origin. - Approximately 50% stenosis of the proximal left cervical ICA. - Severe stenosis of the non dominant right vertebral artery origin with poor opacification of the proximal right vertebral artery and non opacification distally. - Moderate narrowing of the dominant left vertebral artery proximally. 3. Partially imaged bilateral pulmonary emboli and right upper lobe opacities, which were better characterized on recent CTA of the chest from August 29, 2020. Findings were communicated on 09/07/2020 at 10:33 am to provider Dr. Charna Archer Via telephone, who verbally acknowledged these results. Electronically Signed   By: Margaretha Sheffield MD   On: 09/07/2020 10:36   MR BRAIN WO CONTRAST  Addendum Date: 09/07/2020   ADDENDUM REPORT: 09/07/2020 14:42 ADDENDUM: Findings discussed with Dr. Alexis Goodell via telephone at 2:40 PM. Electronically Signed   By: Margaretha Sheffield MD   On: 09/07/2020 14:42   Result Date: 09/07/2020 EXAM: MRI HEAD WITHOUT CONTRAST TECHNIQUE: Multiplanar, multiecho pulse sequences of the brain and surrounding structures were obtained without intravenous contrast. COMPARISON:  Same day CT exams. FINDINGS: Brain: Acute left PCA territory infarct  which involves the left occipital lobe, left hippocampus, and left thalamus. Mild associated edema without substantial mass effect. Additionally, there small infarcts in the left frontal lobe, left caudate, and right cerebellar hemisphere. No hydrocephalus. No mass lesion or suspect antral mass effect. No extra-axial fluid collection. Vascular: Further evaluated on same day CTA Skull and upper cervical spine: Normal marrow signal. Sinuses/Orbits: No acute finding. Other: No mastoid effusions. IMPRESSION: 1. Acute left PCA territory infarct which involves the left occipital lobe, left hippocampus, and left thalamus. Mild associated edema without substantial mass effect. 2. Additionally, there small infarcts in the left frontal lobe, left caudate, and right cerebellar hemisphere. Given involvement of multiple vascular territories, consider centrum embolic source.  Electronically Signed: By: Margaretha Sheffield MD On: 09/07/2020 14:13   CT CHEST ABDOMEN PELVIS W CONTRAST  Result Date: 08/31/2020 CLINICAL DATA:  Metastatic pancreatic cancer. Shortness of breath. EXAM: CT CHEST, ABDOMEN, AND PELVIS WITH CONTRAST TECHNIQUE: Multidetector CT imaging of the chest, abdomen and pelvis was performed following the standard protocol during bolus administration of intravenous contrast. CONTRAST:  161mL OMNIPAQUE IOHEXOL 300 MG/ML  SOLN COMPARISON:  CT scan 06/04/2020 FINDINGS: CT CHEST FINDINGS Cardiovascular: The heart is normal in size. No pericardial effusion. Stable tortuosity, mild ectasia and calcification of the thoracic aorta. Stable branch vessel calcifications including three-vessel coronary artery calcifications. Stable surgical changes from bypass surgery. Mediastinum/Nodes: There are bilateral pulmonary emboli. No evidence of right heart strain. The RV LV ratio is less than 1. Small scattered mediastinal and hilar lymph nodes are stable. None of these measures more than 8 mm. Lungs/Pleura: Stable 7 mm irregular  nodule at the left lung base. 3 mm subpleural pulmonary nodule along the major fissure on image 85/3 is slightly more pronounced but I think this is due to breathing motion artifact. Similar finding involving the 3 mm nodule along the right minor fissure. 5 mm subpleural nodule in the right upper lobe on image number 72/3 previously measured 3 mm. Subpleural atelectasis noted in the right upper lobe posteriorly which may be related to the patient's pulmonary embolism. No pleural effusions. Musculoskeletal: No significant bony findings. No evidence of osseous metastatic disease. CT ABDOMEN PELVIS FINDINGS Hepatobiliary: Progressive hepatic metastatic disease. Enlarging/coalescing mass in segment 8 on image 53/2 measures 5.9 x 5.5 cm and previously measured approximately 4.4 x 4.2 cm. Segment 7 lesion on image 51/2 measures 6.5 x 4.7 cm and previously measured 4.6 x 3.6 cm. Segment 6 lesion on image 63/2 measures 5.5 x 4.5 cm and previously measured 4.4 x 3.4 cm. Pancreas: Pancreatic head mass measures 2.8 x 2.7 cm and previously measured 3.1 x 2.9 cm. Marked atrophy of the remainder of the gland is again noted. Spleen: Normal size. No focal lesions. Adrenals/Urinary Tract: The adrenal glands and kidneys are stable. Bilateral renal cysts are noted. The midpole left renal cyst posteriorly is partially hemorrhagic. Stomach/Bowel: The stomach, duodenum, small bowel and colon are grossly normal without oral contrast. No inflammatory changes, mass lesions or obstructive findings. The appendix is normal. Vascular/Lymphatic: Severe atherosclerotic calcifications involving the aorta, iliac arteries and branch vessels. The right internal iliac artery is occluded proximally. No mesenteric or retroperitoneal mass or adenopathy. Reproductive: The prostate gland and seminal vesicles are unremarkable. Other: No pelvic mass or adenopathy. No free pelvic fluid collections. No inguinal mass or adenopathy. No abdominal wall hernia or  subcutaneous lesions. Musculoskeletal: No significant bony findings. Remote lumbar fusion changes. IMPRESSION: 1. Bilateral pulmonary emboli. No evidence of right heart strain. 2. Progressive hepatic metastatic disease. 3. Slight interval decrease in size of the pancreatic head mass. 4. Stable advanced atherosclerotic calcifications involving the thoracic and abdominal aorta and branch vessels including the coronary arteries. 5. Stable pulmonary nodules. 6. Aortic atherosclerosis. These results will be called to the ordering clinician or representative by the Radiologist Assistant, and communication documented in the PACS or [employer]. Aortic Atherosclerosis (ICD10-I70.0). Electronically Signed   By: Marijo Sanes M.D.   On: 08/31/2020 15:05   US Venous Img Lower Bilateral (DVT)  Result Date: 09/02/2020 CLINICAL DATA:  74 year old male with a history prior pulmonary embolism EXAM: BILATERAL LOWER EXTREMITY VENOUS DOPPLER ULTRASOUND TECHNIQUE: Gray-scale sonography with graded compression, as well as color  Doppler and duplex ultrasound were performed to evaluate the lower extremity deep venous systems from the level of the common femoral vein and including the common femoral, femoral, profunda femoral, popliteal and calf veins including the posterior tibial, peroneal and gastrocnemius veins when visible. The superficial great saphenous vein was also interrogated. Spectral Doppler was utilized to evaluate flow at rest and with distal augmentation maneuvers in the common femoral, femoral and popliteal veins. COMPARISON:  None. FINDINGS: RIGHT LOWER EXTREMITY Common Femoral Vein: No evidence of thrombus. Normal compressibility, respiratory phasicity and response to augmentation. Saphenofemoral Junction: No evidence of thrombus. Normal compressibility and flow on color Doppler imaging. Profunda Femoral Vein: No evidence of thrombus. Normal compressibility and flow on color Doppler imaging. Femoral Vein: No  evidence of thrombus. Normal compressibility, respiratory phasicity and response to augmentation. Popliteal Vein: Partially compressible popliteal vein without occlusion. Calf Veins: No evidence of thrombus. Normal compressibility and flow on color Doppler imaging. Superficial Great Saphenous Vein: No evidence of thrombus. Normal compressibility and flow on color Doppler imaging. Other Findings:  None. LEFT LOWER EXTREMITY Common Femoral Vein: No evidence of thrombus. Normal compressibility, respiratory phasicity and response to augmentation. Saphenofemoral Junction: No evidence of thrombus. Normal compressibility and flow on color Doppler imaging. Profunda Femoral Vein: No evidence of thrombus. Normal compressibility and flow on color Doppler imaging. Femoral Vein: No evidence of thrombus. Normal compressibility, respiratory phasicity and response to augmentation. Popliteal Vein: No evidence of thrombus. Normal compressibility, respiratory phasicity and response to augmentation. Calf Veins: No evidence of thrombus. Normal compressibility and flow on color Doppler imaging. Superficial Great Saphenous Vein: No evidence of thrombus. Normal compressibility and flow on color Doppler imaging. Other Findings: Lentiform heterogeneously echoic soft tissue within the thigh measures 4.1 cm x 1.6 cm x 5.1 cm. No internal color flow. IMPRESSION: Sonographic survey of the right lower extremity positive for nonocclusive DVT of the popliteal vein. Sonographic survey of the left lower extremity negative for DVT. Lentiform soft tissue lesion in the left thigh measures 5.1 cm. Ultrasound characteristics are nonspecific, and this may represent a lipoma. Note that low-grade liposarcoma cannot be excluded on ultrasound. Correlation with patient presentation may be useful, as well as consideration of MRI if indicated. Electronically Signed   By: Corrie Mckusick D.O.   On: 09/02/2020 11:51   ECHOCARDIOGRAM COMPLETE  Result Date:  09/03/2020    ECHOCARDIOGRAM REPORT   Patient Name:   Rajendra ROSKELLEY Date of Exam: 09/02/2020 Medical Rec #:  [id number]       Height:       69.0 in Accession #:    [id number]      Weight:       160.0 lb Date of Birth:  09/04/1946       BSA:          1.879 m Patient Age:    74 years        BP:           165/84 mmHg Patient Gender: M               HR:           85 bpm. Exam Location:  [facility] Procedure: 2D Echo, Color Doppler and Cardiac Doppler Indications:     I26.99 Pulmonary Embolus  History:         Patient has prior history of Echocardiogram examinations. CHF,                  CAD, Prior CABG; Risk  Factors:Hypertension and Dyslipidemia.  Sonographer:     Charmayne Sheer RDCS (AE) Referring Phys:  [id number] Ivor Costa Diagnosing Phys: Yolonda Kida MD  Sonographer Comments: Suboptimal subcostal window. IMPRESSIONS  1. Left ventricular ejection fraction, by estimation, is 55 to 60%. The left ventricle has normal function. The left ventricle has no regional wall motion abnormalities. Left ventricular diastolic parameters are consistent with Grade I diastolic dysfunction (impaired relaxation).  2. Right ventricular systolic function is normal. The right ventricular size is normal.  3. The mitral valve is normal in structure. No evidence of mitral valve regurgitation.  4. The aortic valve is grossly normal. There is mild calcification of the aortic valve. There is mild thickening of the aortic valve. Aortic valve regurgitation is not visualized. Mild to moderate aortic valve sclerosis/calcification is present, without  any evidence of aortic stenosis. FINDINGS  Left Ventricle: Left ventricular ejection fraction, by estimation, is 55 to 60%. The left ventricle has normal function. The left ventricle has no regional wall motion abnormalities. The left ventricular internal cavity size was normal in size. There is  no left ventricular hypertrophy. Left ventricular diastolic parameters are consistent with Grade I diastolic  dysfunction (impaired relaxation). Right Ventricle: The right ventricular size is normal. No increase in right ventricular wall thickness. Right ventricular systolic function is normal. Left Atrium: Left atrial size was normal in size. Right Atrium: Right atrial size was normal in size. Pericardium: There is no evidence of pericardial effusion. Mitral Valve: The mitral valve is normal in structure. No evidence of mitral valve regurgitation. MV peak gradient, 5.0 mmHg. The mean mitral valve gradient is 2.0 mmHg. Tricuspid Valve: The tricuspid valve is normal in structure. Tricuspid valve regurgitation is not demonstrated. Aortic Valve: The aortic valve is grossly normal. There is mild calcification of the aortic valve. There is mild thickening of the aortic valve. There is mild aortic valve annular calcification. Aortic valve regurgitation is not visualized. Mild to moderate aortic valve sclerosis/calcification is present, without any evidence of aortic stenosis. Aortic valve mean gradient measures 3.0 mmHg. Aortic valve peak gradient measures 5.3 mmHg. Aortic valve area, by VTI measures 1.71 cm. Pulmonic Valve: The pulmonic valve was grossly normal. Pulmonic valve regurgitation is not visualized. Aorta: The ascending aorta was not well visualized. IAS/Shunts: No atrial level shunt detected by color flow Doppler.  LEFT VENTRICLE PLAX 2D LVIDd:         4.06 cm  Diastology LVIDs:         2.85 cm  LV e' medial:    5.33 cm/s LV PW:         0.97 cm  LV E/e' medial:  14.0 LV IVS:        0.78 cm  LV e' lateral:   8.05 cm/s LVOT diam:     2.00 cm  LV E/e' lateral: 9.3 LV SV:         33 LV SV Index:   17 LVOT Area:     3.14 cm  RIGHT VENTRICLE RV Basal diam:  2.50 cm RV Mid diam:    3.64 cm LEFT ATRIUM           Index       RIGHT ATRIUM          Index LA diam:      2.90 cm 1.54 cm/m  RA Area:     8.63 cm LA Vol (A2C): 26.3 ml 14.00 ml/m RA Volume:   13.40 ml 7.13 ml/m LA Vol (A4C):  24.5 ml 13.04 ml/m  AORTIC VALVE                    PULMONIC VALVE AV Area (Vmax):    1.87 cm    PV Vmax:       0.67 m/s AV Area (Vmean):   1.89 cm    PV Vmean:      50.000 cm/s AV Area (VTI):     1.71 cm    PV VTI:        0.118 m AV Vmax:           115.00 cm/s PV Peak grad:  1.8 mmHg AV Vmean:          76.500 cm/s PV Mean grad:  1.0 mmHg AV VTI:            0.191 m AV Peak Grad:      5.3 mmHg AV Mean Grad:      3.0 mmHg LVOT Vmax:         68.50 cm/s LVOT Vmean:        46.100 cm/s LVOT VTI:          0.104 m LVOT/AV VTI ratio: 0.54  AORTA Ao Root diam: 3.10 cm MITRAL VALVE MV Area (PHT): 5.20 cm     SHUNTS MV Peak grad:  5.0 mmHg     Systemic VTI:  0.10 m MV Mean grad:  2.0 mmHg     Systemic Diam: 2.00 cm MV Vmax:       1.12 m/s MV Vmean:      59.6 cm/s MV Decel Time: 146 msec MV E velocity: 74.60 cm/s MV A velocity: 110.00 cm/s MV E/A ratio:  0.68 Dwayne D Callwood MD Electronically signed by Yolonda Kida MD Signature Date/Time: 09/03/2020/4:31:23 PM    Final    CT HEAD CODE STROKE WO CONTRAST  Result Date: 09/07/2020 CLINICAL DATA:  Code stroke. Neuro deficit, acute stroke suspected. Right-sided weakness and headache. EXAM: CT HEAD WITHOUT CONTRAST TECHNIQUE: Contiguous axial images were obtained from the base of the skull through the vertex without intravenous contrast. COMPARISON:  None. FINDINGS: Brain: No evidence of acute large vascular territory infarction, hemorrhage, hydrocephalus, extra-axial collection or mass lesion/mass effect. Patchy white matter hypoattenuation, nonspecific but most likely secondary to chronic microvascular ischemic disease. Vascular: Intracranial calcific atherosclerosis. No hyperdense vessel identified. Skull: No acute fracture. Sinuses/Orbits: No acute findings. Other: No mastoid effusions. ASPECTS [facility] Stroke Program Early CT Score) total score (0-10 with 10 being normal): 10. IMPRESSION: No evidence of acute intracranial abnormality.  ASPECTS is 10. Code stroke imaging results were communicated on  09/07/2020 at 9:54 am to provider Dr. Charna Archer via telephone, who verbally acknowledged these results. Electronically Signed   By: Margaretha Sheffield MD   On: 09/07/2020 09:57    PERFORMANCE STATUS (ECOG) : 1  Review of Systems Unless otherwise noted, a complete review of systems is negative.  Physical Exam General: NAD, frail appearing, thin Pulmonary: Unlabored Extremities: no edema, no joint deformities Skin: no rashes Neurological: Weakness, oriented to person place  IMPRESSION: Dr. Tasia Catchings and I met with patient, wife, and sister.  Patient has evidence of disease progression on imaging.  He has overall declined with poor oral intake, weakness, and fatigue since discharging home from the hospital with CVA.  Dr. Tasia Catchings offered fifth line treatment but response rate was expected to be poor.  Alternatively, comfort care and hospice was discussed as an option.    Patient seems to lack  insight into his current health problems.  Despite an extensive conversation regarding the current state of his health, he was unsure why he was here today in the clinic.  I do not feel that patient has capacity for complex medical decision-making at this time.  Therefore, will defer decision-making to wife.  Ultimately, both wife and sister felt that additional cancer treatments would not be in patient's best interest.  Instead, they verbalized a desire to pursue hospice at home.  They confirmed DNR/DNI.  PLAN: -Best supportive care -Referral for hospice called to [facility] -DNR/DNI -RTC as needed  Case and plan discussed with Dr. Tasia Catchings   Time Total: 45 minutes  Visit consisted of counseling and education dealing with the complex and emotionally intense issues of symptom management and palliative care in the setting of serious and potentially life-threatening illness.Greater than 50%  of this time was spent counseling and coordinating care related to the above assessment and plan.  Signed by: Altha Harm,  PhD, NP-C [phone] (Work Cell)

## 2020-09-12 NOTE — Progress Notes (Signed)
Patient here with Sister and wife. No new concerns voiced.

## 2020-10-06 ENCOUNTER — Telehealth: Payer: Self-pay

## 2020-10-06 NOTE — Telephone Encounter (Signed)
Death certification completed and signed by MD. [facility] to notify them that certificate is ready for pick up. Left certificate downstairs in designated book for pick up and copy was sent to scan in chart.

## 2020-10-11 DEATH — deceased

## 2021-04-14 IMAGING — CR DG CHEST 2V
1 series · 2 of 2 positions shown · non-contrast
Comparison: 01/23/2018

CLINICAL DATA: Shortness of breath.

EXAM:
CHEST - 2 VIEW

[Series 1: dg chest 2 view · 0.14mm/px · 2 of 2 slices shown]
[im 1/2]
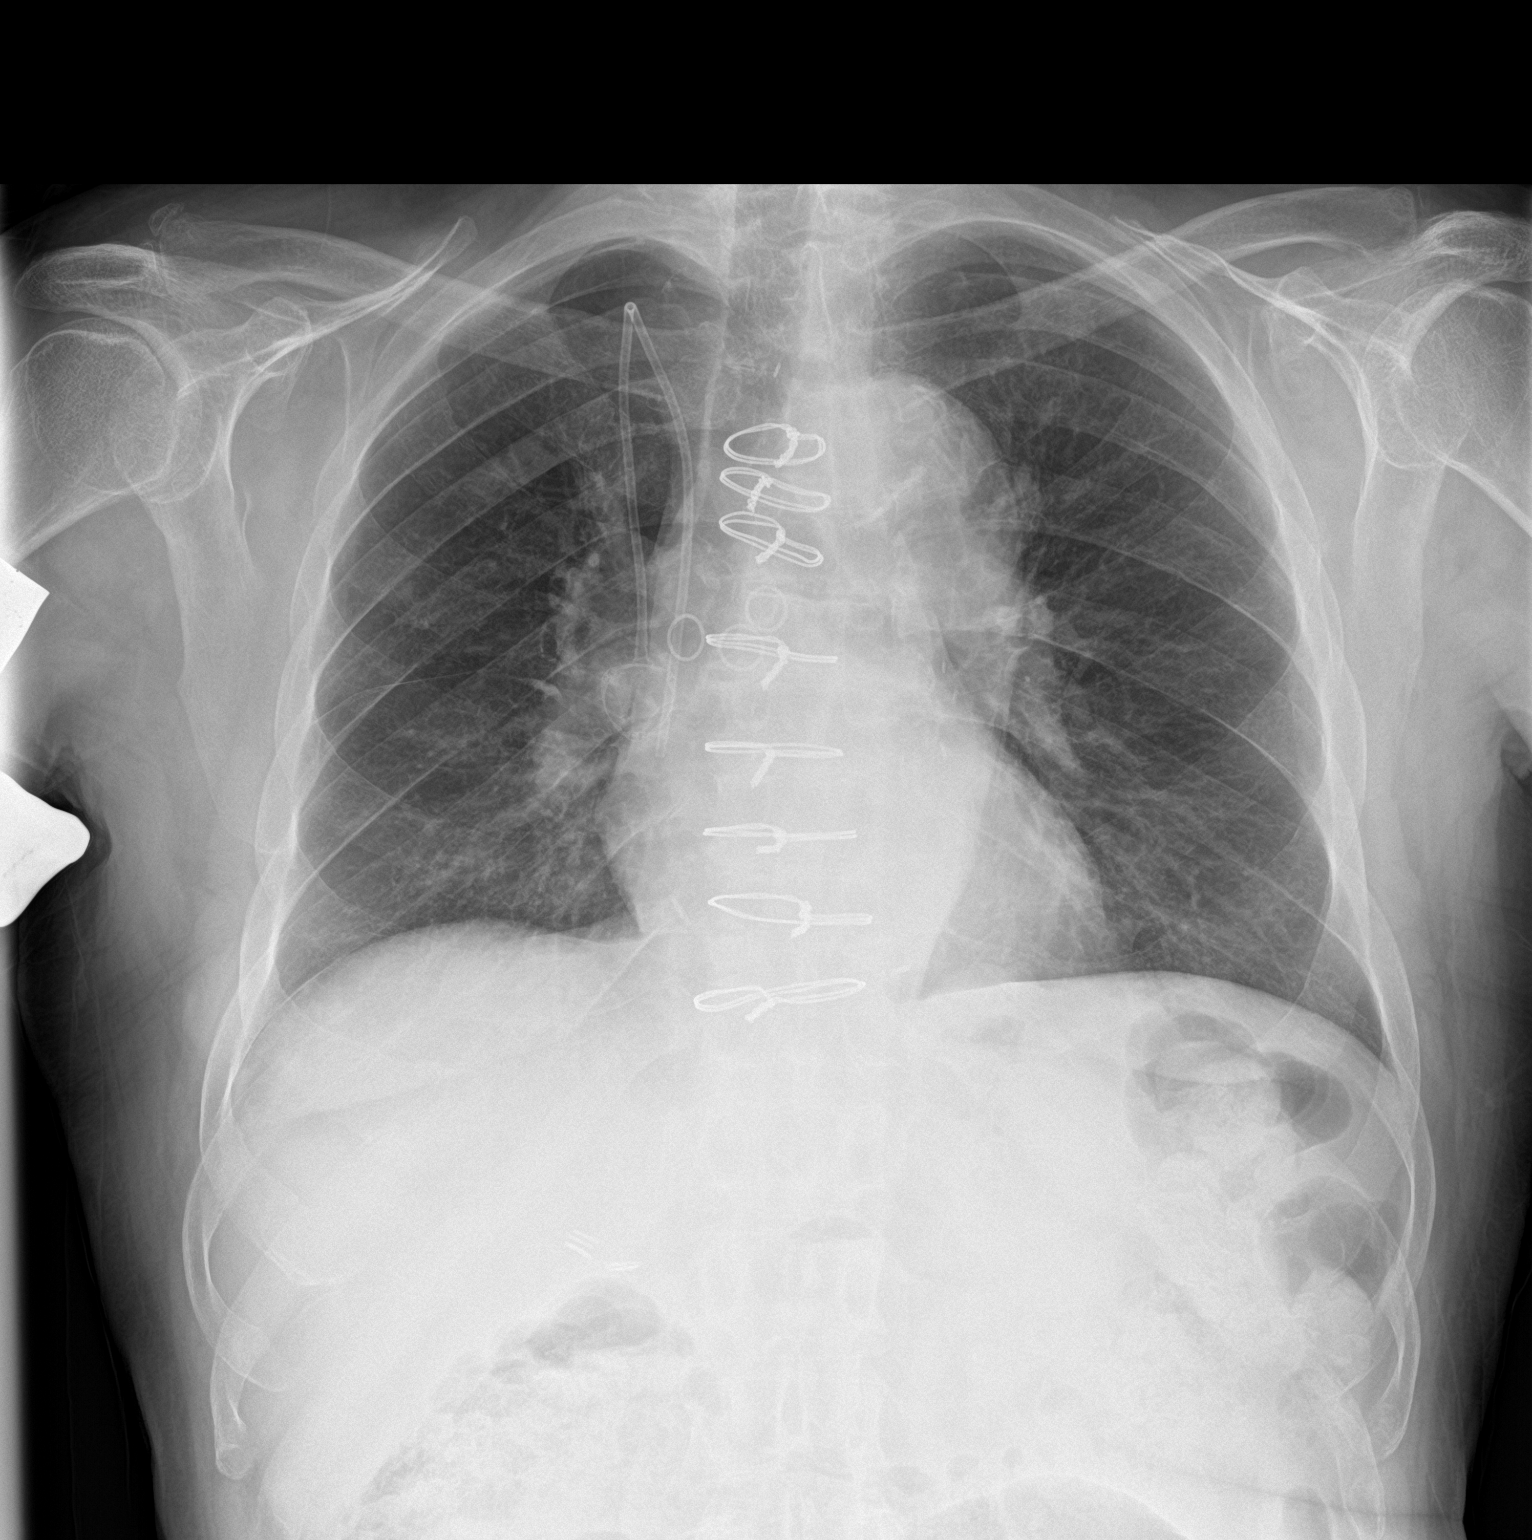
[im 2/2]
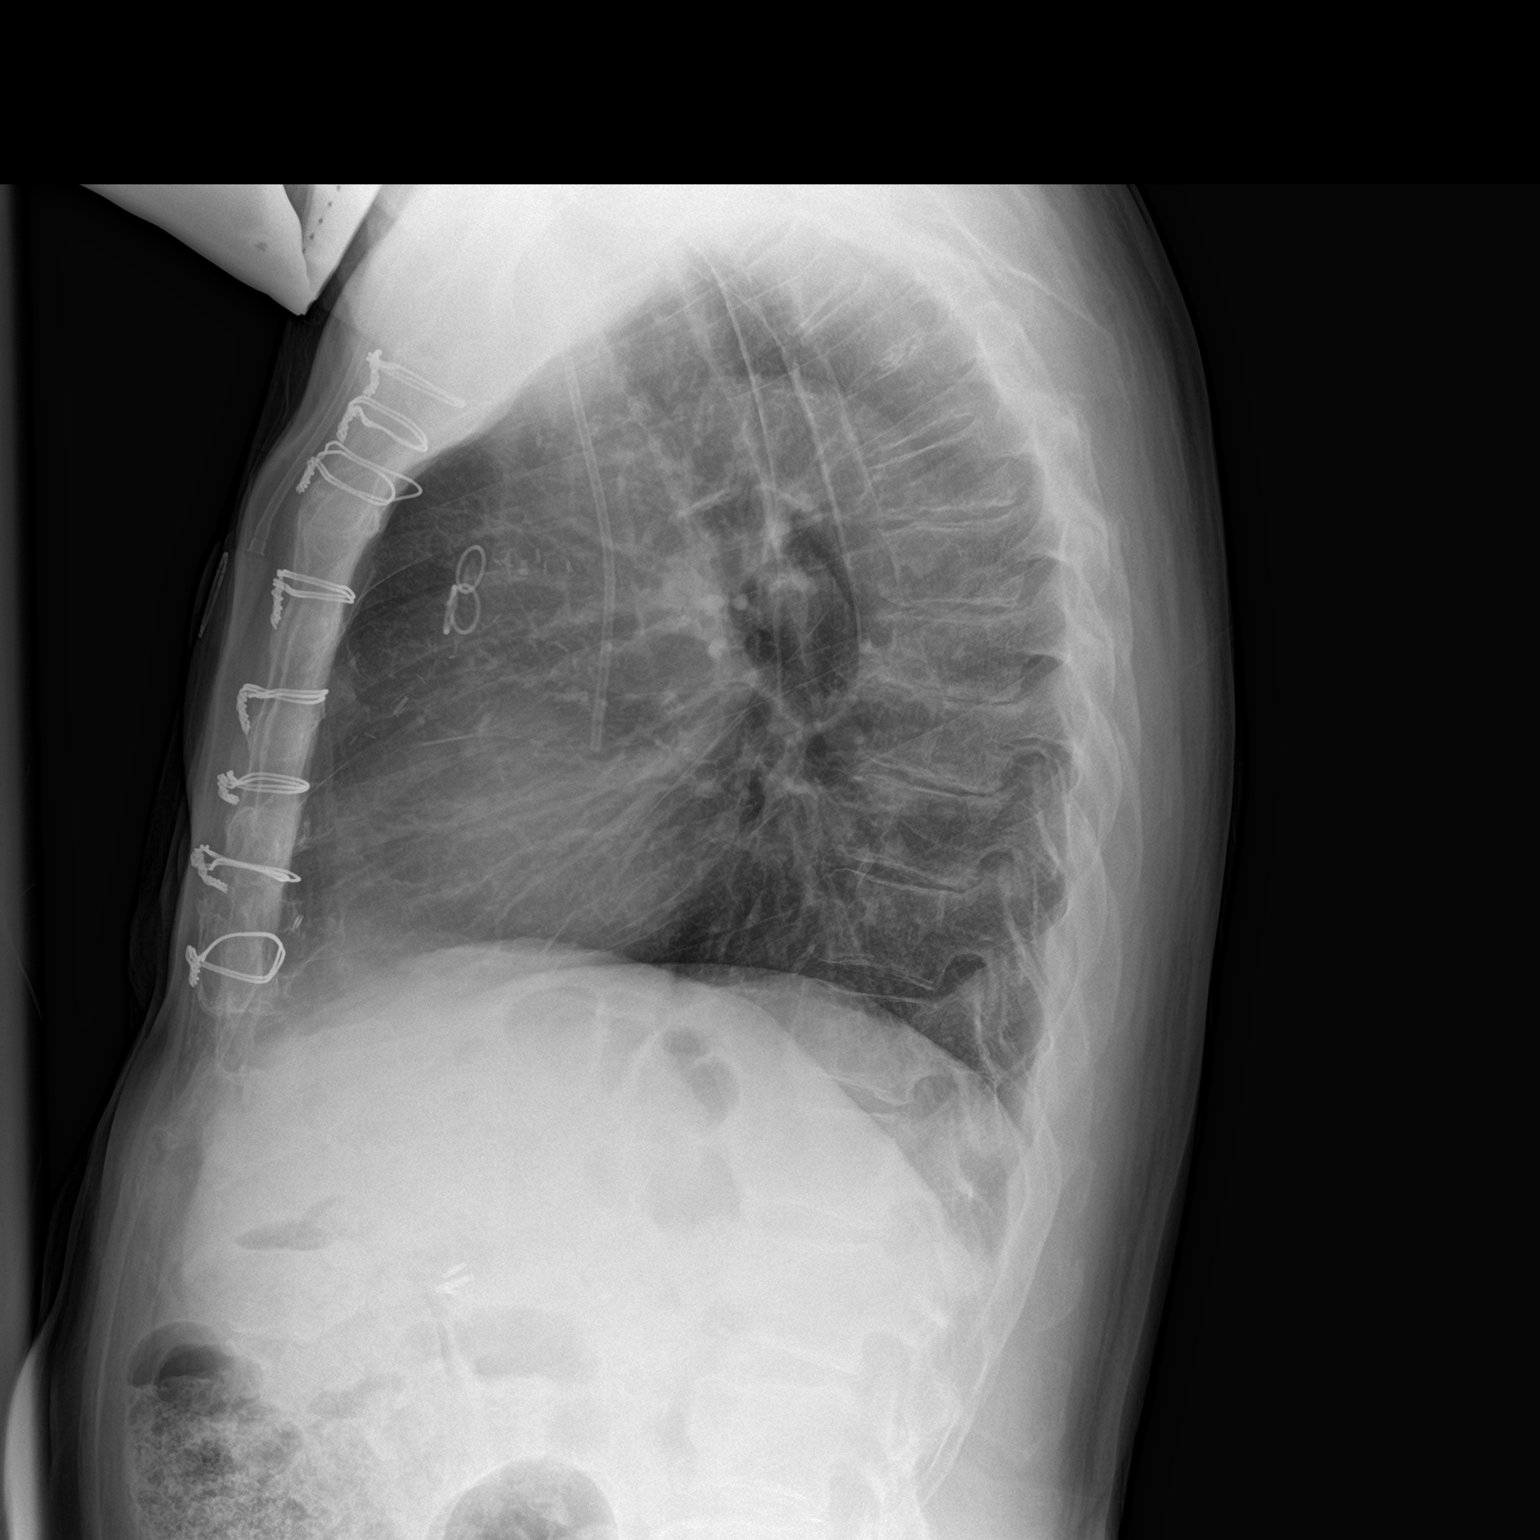

[2 of 2 positions shown; findings below may reference images not displayed]

FINDINGS: The right IJ Port-A-Cath is in good position.

Stable tortuosity, ectasia and calcification of the thoracic aorta.

Stable surgical changes from triple bypass surgery.

The lungs are clear of an acute process. No pleural effusions or
pulmonary edema. The bony thorax is intact.
IMPRESSION: No acute cardiopulmonary findings.

## 2021-04-22 IMAGING — US US EXTREM LOW VENOUS
1 series · 12 of 24 positions shown · non-contrast
Comparison: None.

CLINICAL DATA: 74-year-old male with a history prior pulmonary
embolism



[Series 1: us venous img lower bilat (dvt) · portal-venous · 109 acquisitions, 12 frames shown]
[im 5/109]
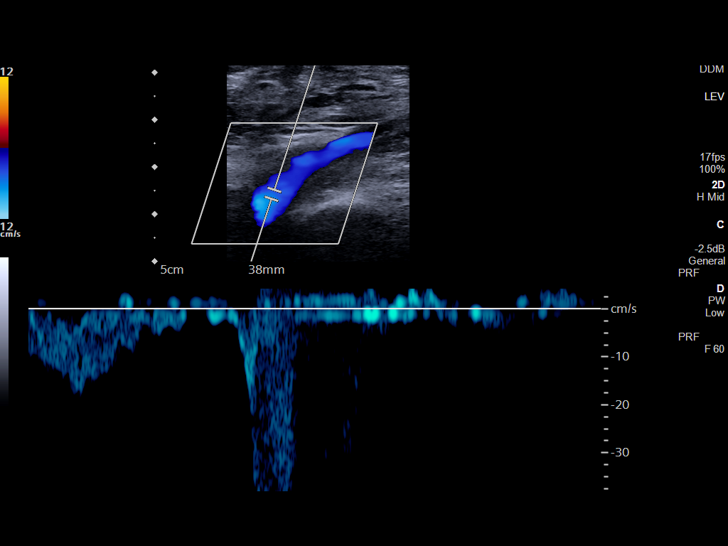
[im 15/109]
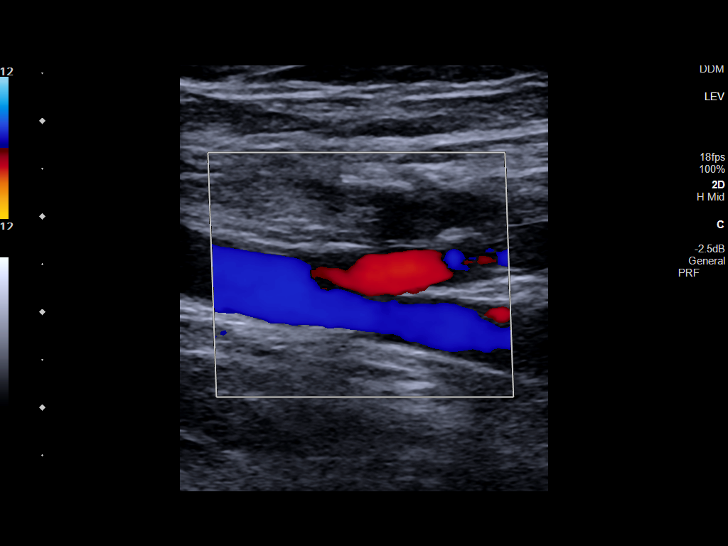
[im 24/109]
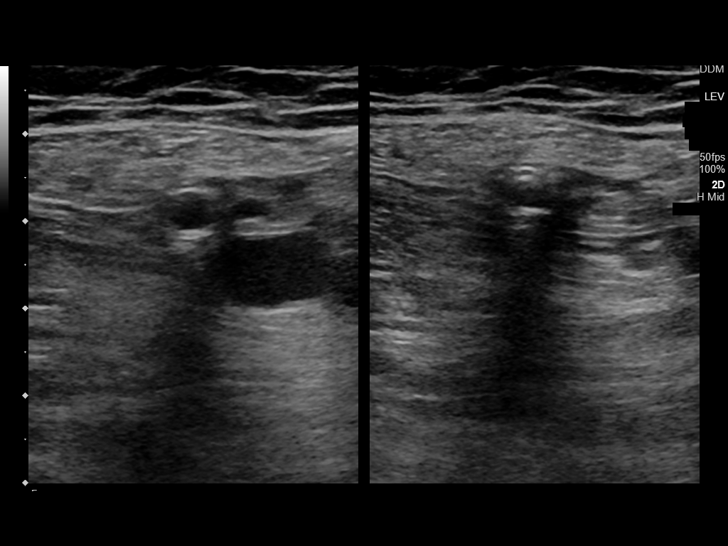
[im 33/109]
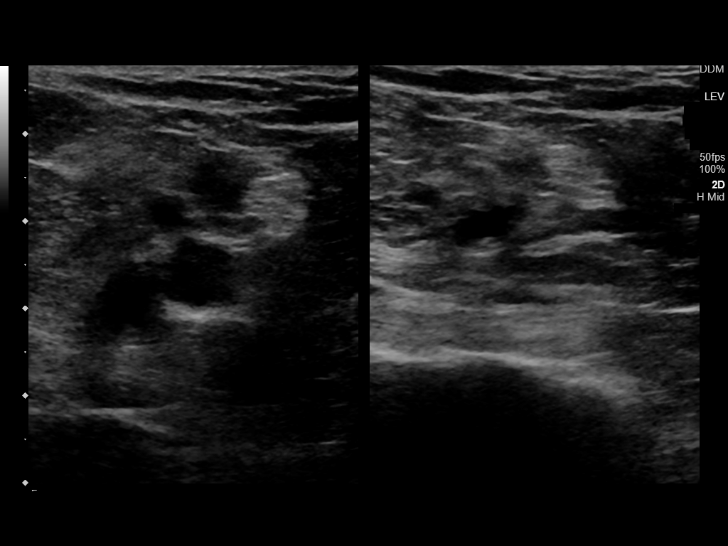
[im 43/109]
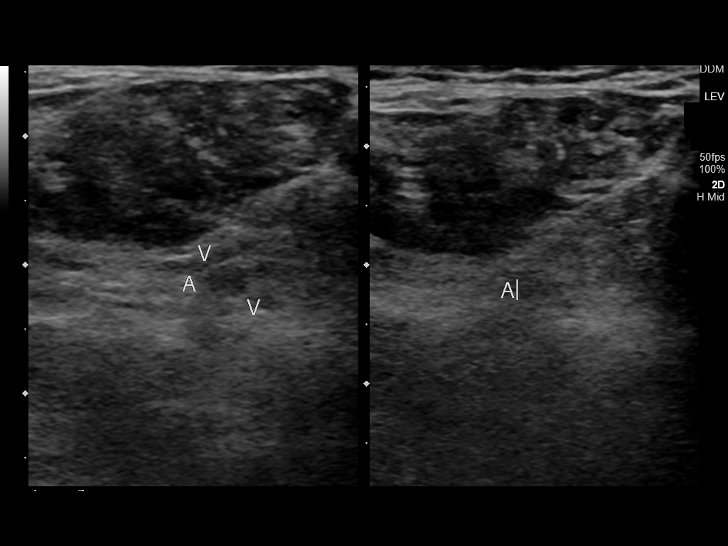
[im 52/109]
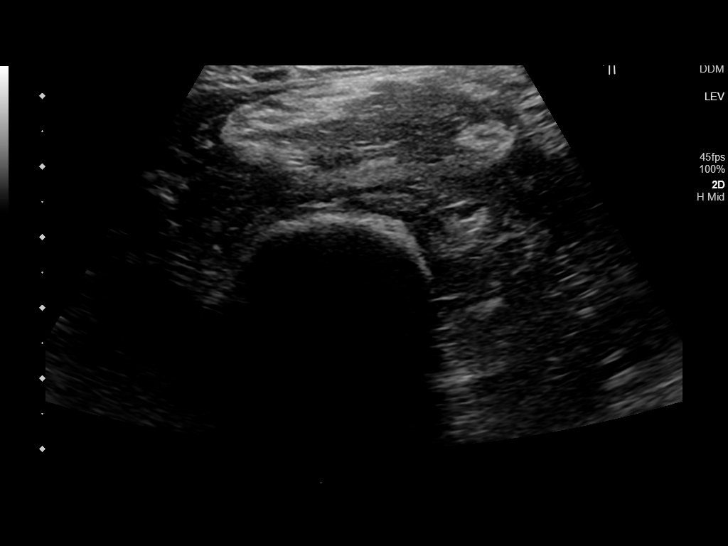
[im 66/109]
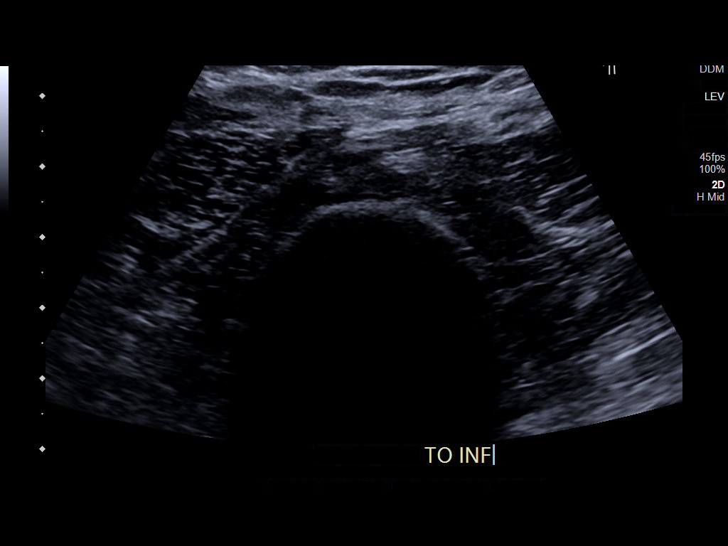
[im 71/109]
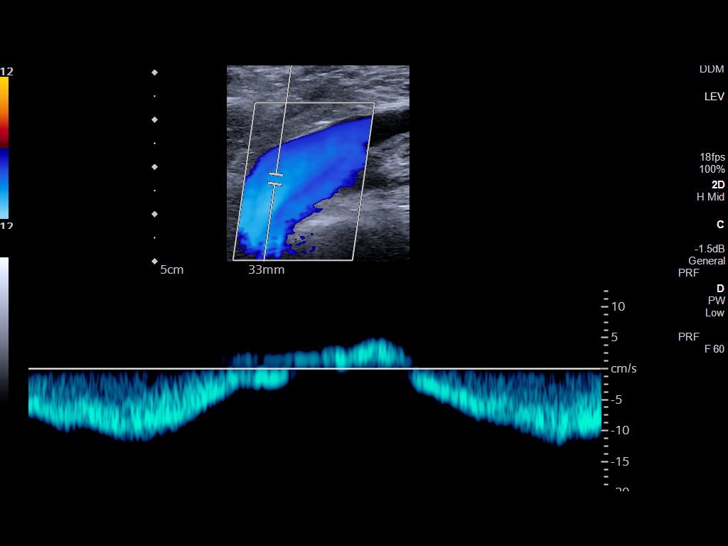
[im 80/109]
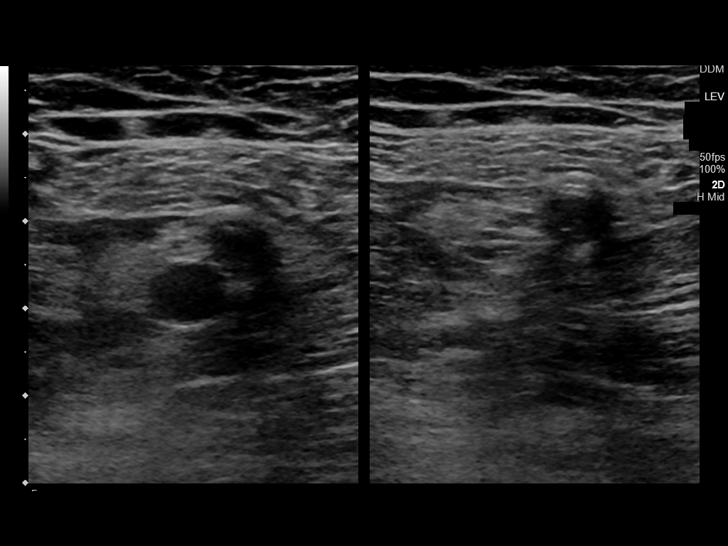
[im 90/109]
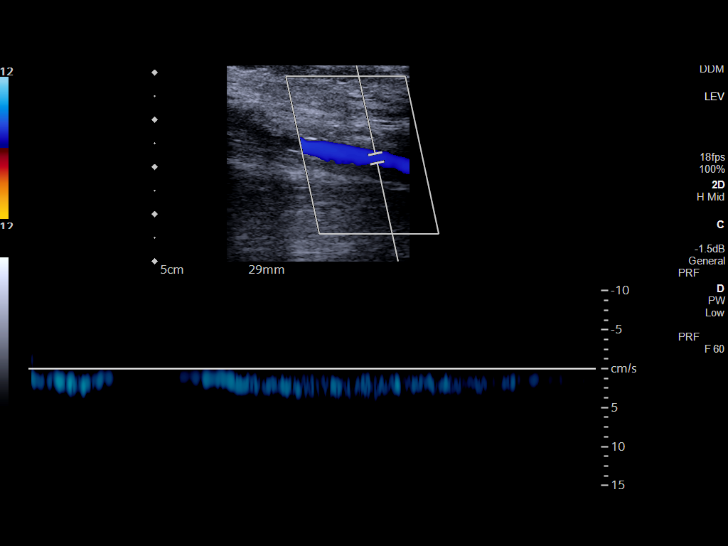
[im 99/109]
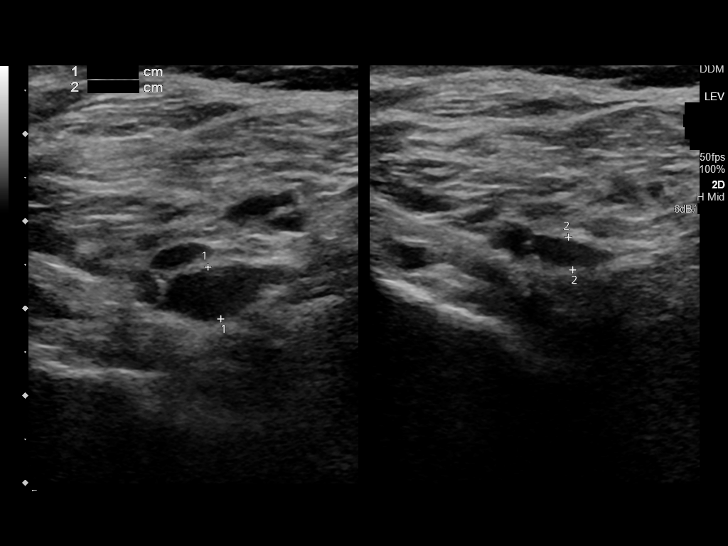
[im 109/109]
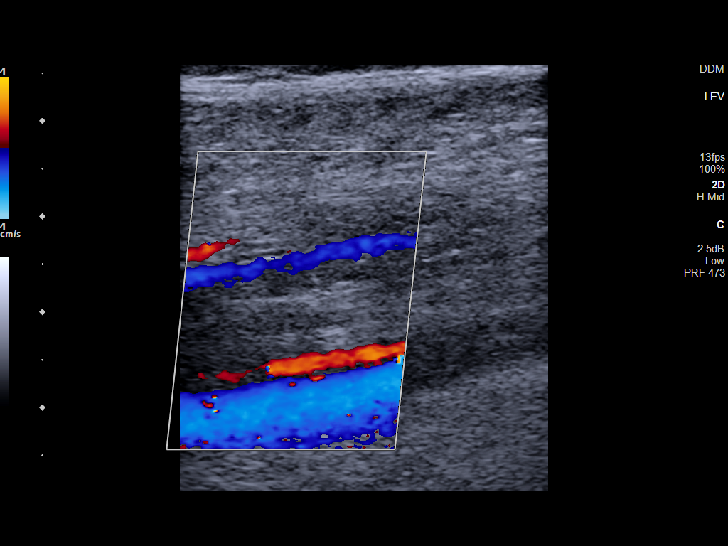

[12 of 24 positions shown; findings below may reference images not displayed]

FINDINGS: RIGHT LOWER EXTREMITY

Common Femoral Vein: No evidence of thrombus. Normal
compressibility, respiratory phasicity and response to augmentation.

Saphenofemoral Junction: No evidence of thrombus. Normal
compressibility and flow on color Doppler imaging.

Profunda Femoral Vein: No evidence of thrombus. Normal
compressibility and flow on color Doppler imaging.

Femoral Vein: No evidence of thrombus. Normal compressibility,
respiratory phasicity and response to augmentation.

Popliteal Vein: Partially compressible popliteal vein without
occlusion.

Calf Veins: No evidence of thrombus. Normal compressibility and flow
on color Doppler imaging.

Superficial Great Saphenous Vein: No evidence of thrombus. Normal
compressibility and flow on color Doppler imaging.

Other Findings:  None.

LEFT LOWER EXTREMITY

Common Femoral Vein: No evidence of thrombus. Normal
compressibility, respiratory phasicity and response to augmentation.

Saphenofemoral Junction: No evidence of thrombus. Normal
compressibility and flow on color Doppler imaging.

Profunda Femoral Vein: No evidence of thrombus. Normal
compressibility and flow on color Doppler imaging.

Femoral Vein: No evidence of thrombus. Normal compressibility,
respiratory phasicity and response to augmentation.

Popliteal Vein: No evidence of thrombus. Normal compressibility,
respiratory phasicity and response to augmentation.

Calf Veins: No evidence of thrombus. Normal compressibility and flow
on color Doppler imaging.

Superficial Great Saphenous Vein: No evidence of thrombus. Normal
compressibility and flow on color Doppler imaging.

Other Findings: Lentiform heterogeneously echoic soft tissue within
the thigh measures 4.1 cm x 1.6 cm x 5.1 cm. No internal color flow.
IMPRESSION: Sonographic survey of the right lower extremity positive for
nonocclusive DVT of the popliteal vein.

Sonographic survey of the left lower extremity negative for DVT.

Lentiform soft tissue lesion in the left thigh measures 5.1 cm.
Ultrasound characteristics are nonspecific, and this may represent a
lipoma. Note that low-grade liposarcoma cannot be excluded on
ultrasound. Correlation with patient presentation may be useful, as
well as consideration of MRI if indicated.

## 2021-04-22 IMAGING — CR DG CHEST 2V
2 series · 2 of 2 positions shown · non-contrast
Comparison: None.

CLINICAL DATA: Tachycardia

EXAM:
CHEST - 2 VIEW

[chest pa]
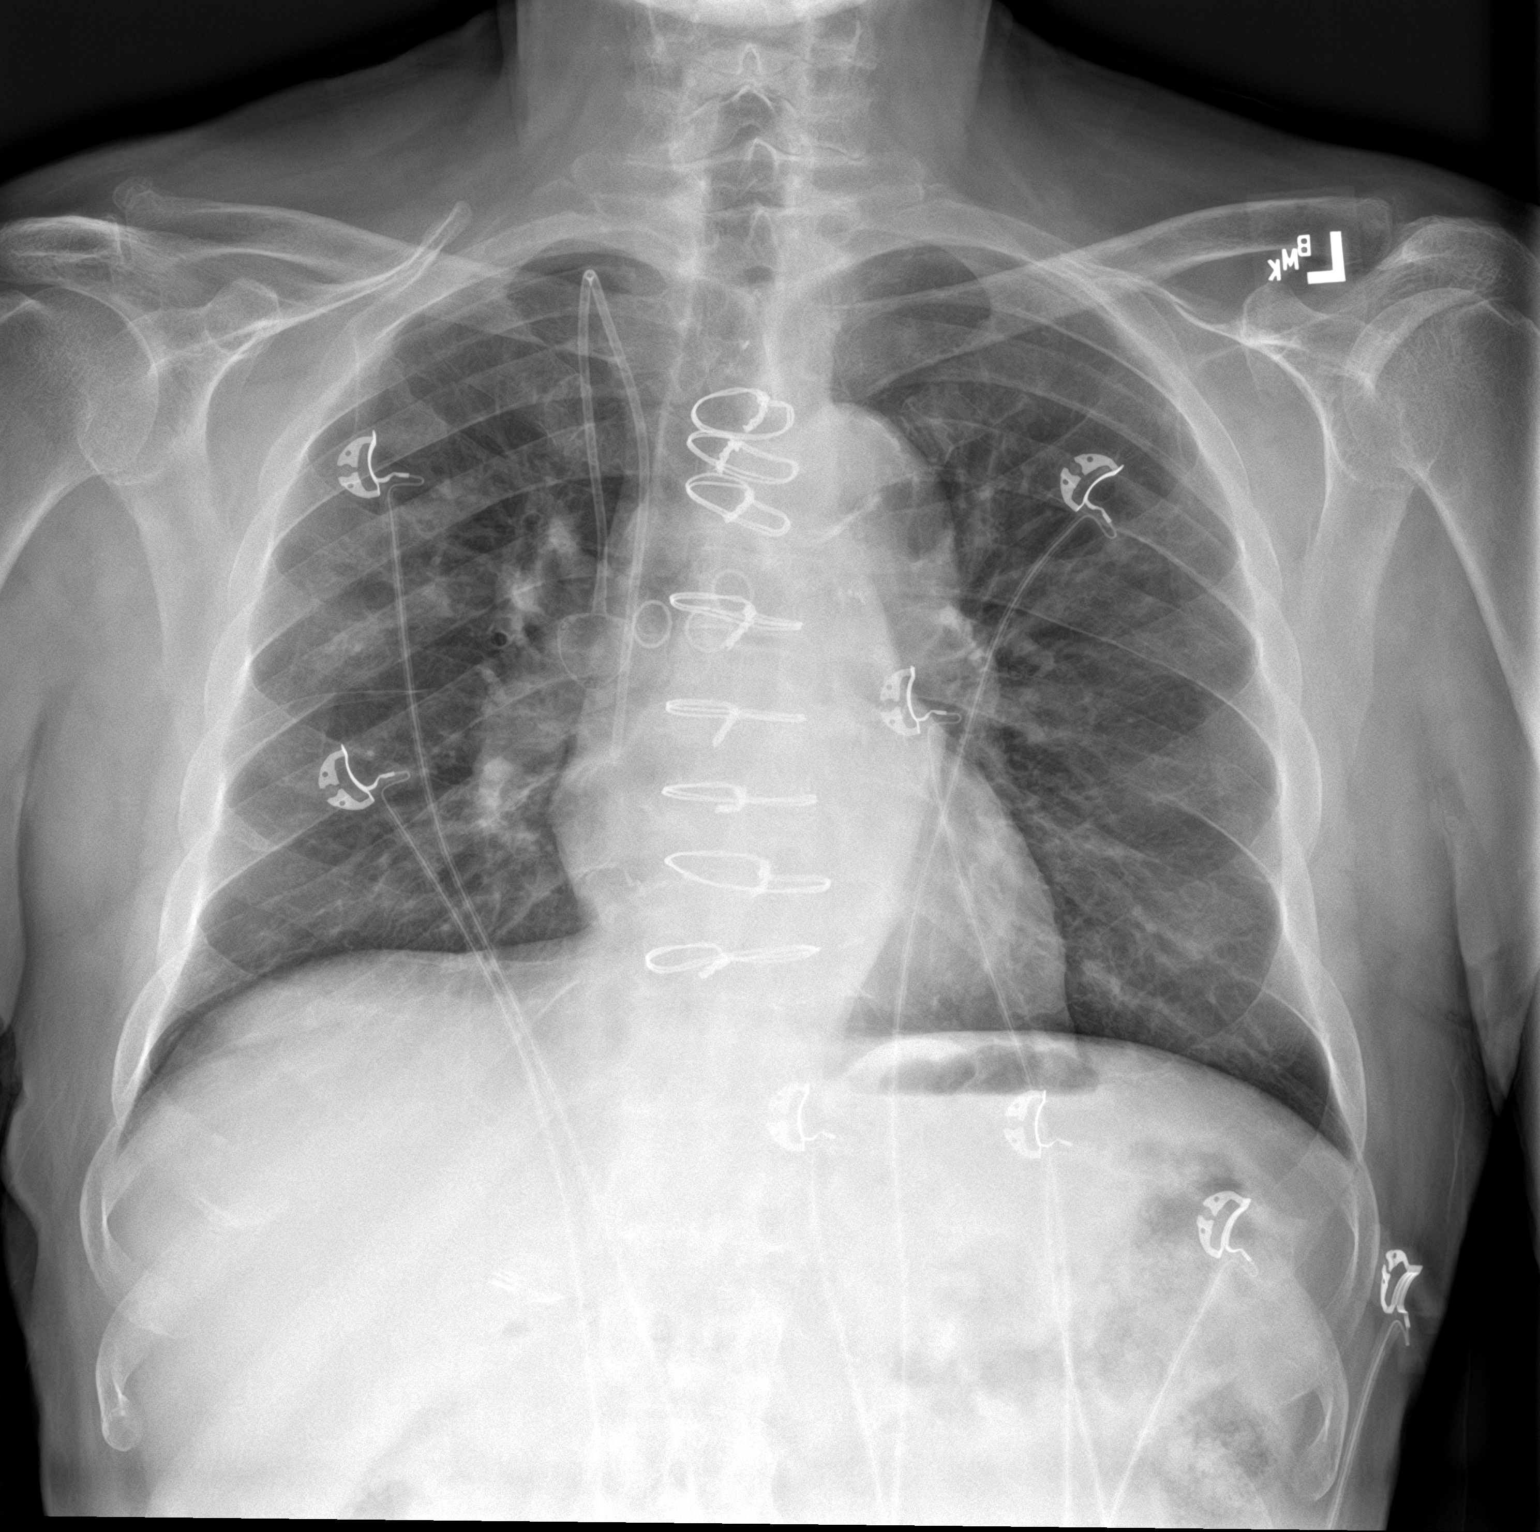

[chest lat]
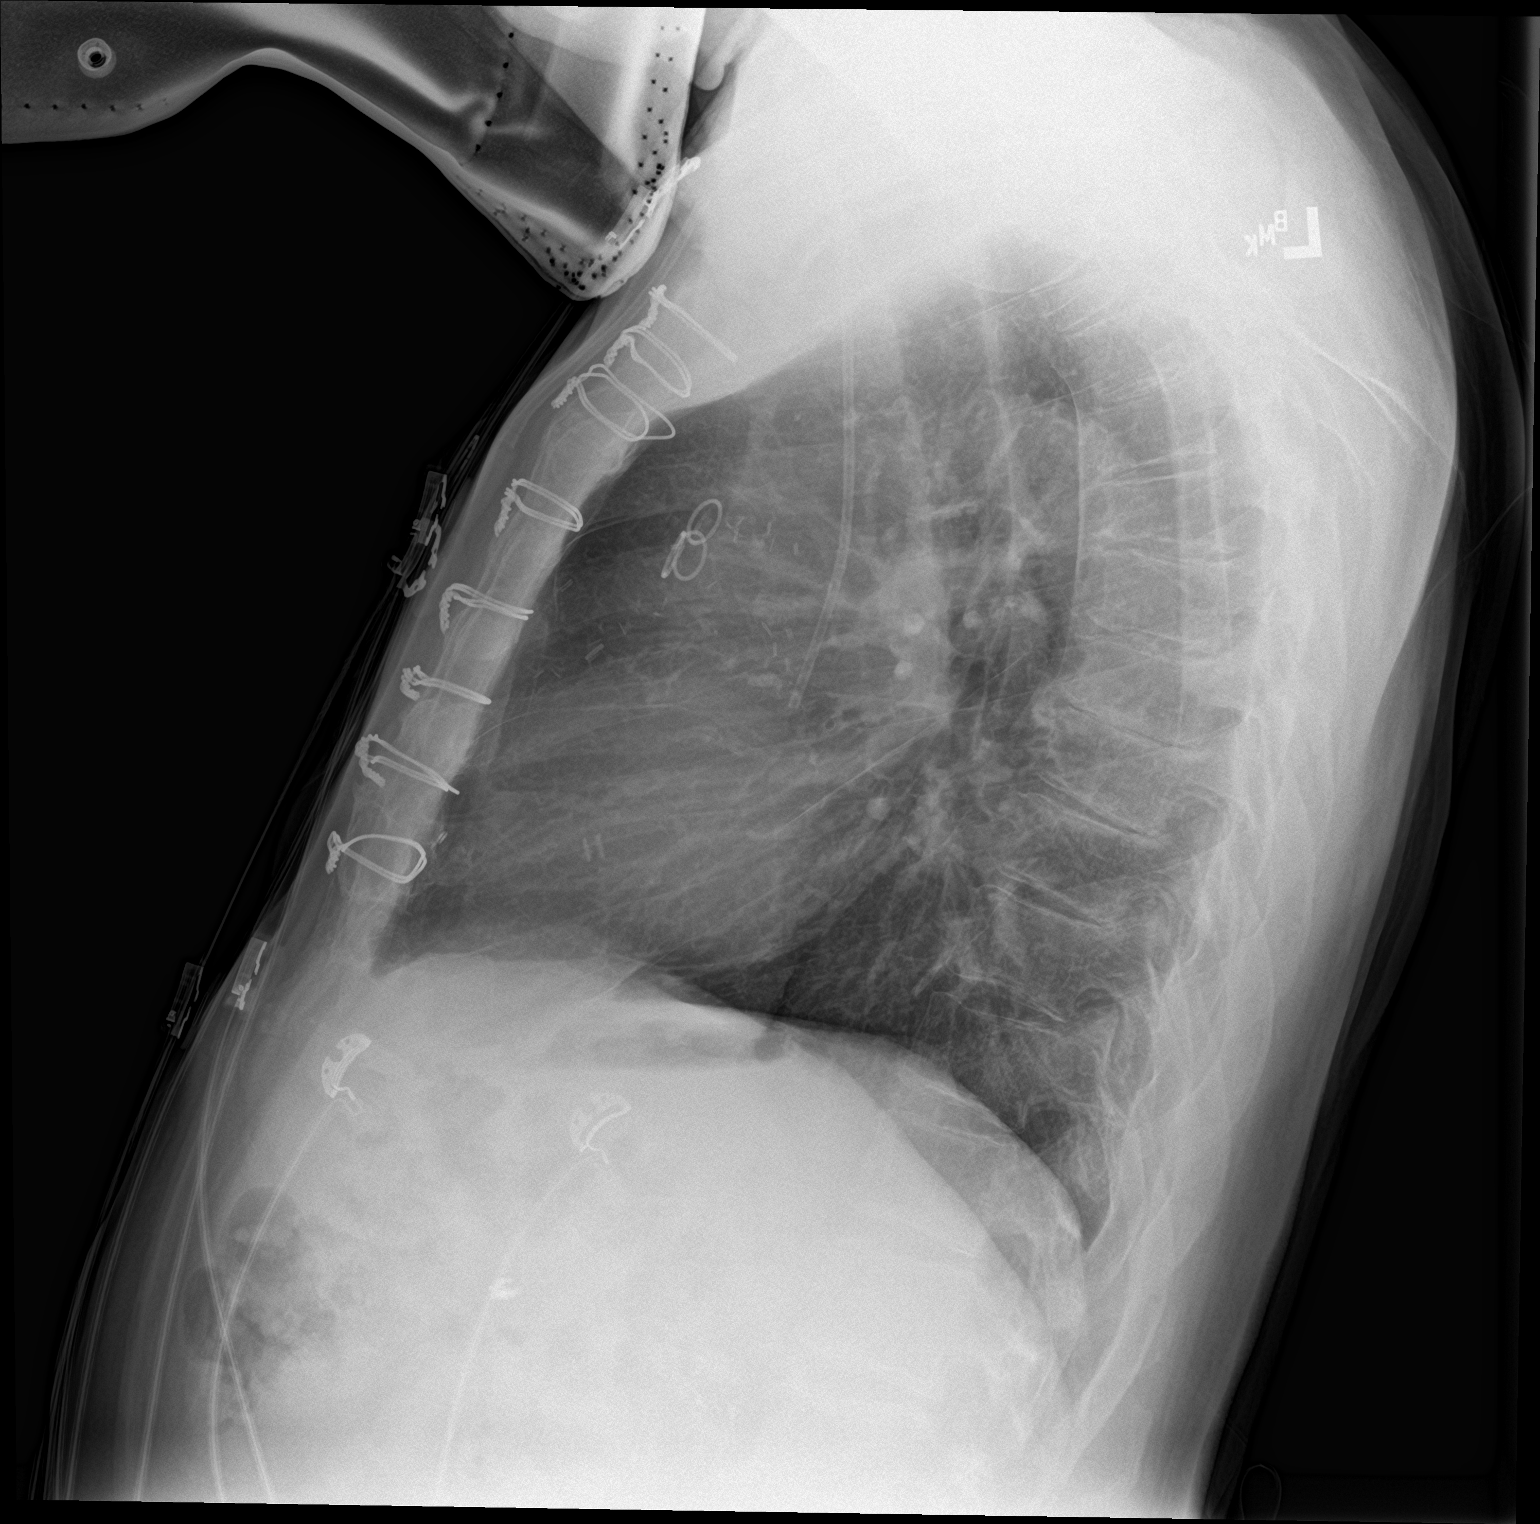

[2 of 2 positions shown; findings below may reference images not displayed]

FINDINGS: The heart size and mediastinal contours are within normal limits.
Both lungs are clear. The visualized skeletal structures are
unremarkable.

Right chest wall Port-A-Cath with tip at the cavoatrial junction.
IMPRESSION: No active cardiopulmonary disease.

## 2021-05-04 ENCOUNTER — Other Ambulatory Visit (HOSPITAL_COMMUNITY): Payer: Self-pay

## 2022-07-09 ENCOUNTER — Other Ambulatory Visit (HOSPITAL_COMMUNITY): Payer: Self-pay

## 2023-10-24 ENCOUNTER — Other Ambulatory Visit (HOSPITAL_COMMUNITY): Payer: Self-pay

## 2024-04-02 ENCOUNTER — Other Ambulatory Visit (HOSPITAL_COMMUNITY): Payer: Self-pay
